# Patient Record
Sex: Female | Born: 1945 | Race: White | Hispanic: No | Marital: Married | Smoking: Former smoker
Health system: Southern US, Community
[De-identification: ages and names within clinical notes are randomized; demographics above are authoritative.]

## PROBLEM LIST (undated history)

## (undated) DIAGNOSIS — E785 Hyperlipidemia, unspecified: Secondary | ICD-10-CM

## (undated) DIAGNOSIS — K219 Gastro-esophageal reflux disease without esophagitis: Secondary | ICD-10-CM

## (undated) DIAGNOSIS — E039 Hypothyroidism, unspecified: Secondary | ICD-10-CM

## (undated) DIAGNOSIS — F32A Depression, unspecified: Secondary | ICD-10-CM

## (undated) DIAGNOSIS — D649 Anemia, unspecified: Secondary | ICD-10-CM

## (undated) DIAGNOSIS — M2550 Pain in unspecified joint: Secondary | ICD-10-CM

## (undated) DIAGNOSIS — E079 Disorder of thyroid, unspecified: Secondary | ICD-10-CM

## (undated) DIAGNOSIS — T7840XA Allergy, unspecified, initial encounter: Secondary | ICD-10-CM

## (undated) DIAGNOSIS — D0510 Intraductal carcinoma in situ of unspecified breast: Secondary | ICD-10-CM

## (undated) DIAGNOSIS — H269 Unspecified cataract: Secondary | ICD-10-CM

## (undated) DIAGNOSIS — J45909 Unspecified asthma, uncomplicated: Secondary | ICD-10-CM

## (undated) DIAGNOSIS — G4700 Insomnia, unspecified: Secondary | ICD-10-CM

## (undated) DIAGNOSIS — C50919 Malignant neoplasm of unspecified site of unspecified female breast: Secondary | ICD-10-CM

## (undated) DIAGNOSIS — F419 Anxiety disorder, unspecified: Secondary | ICD-10-CM

## (undated) DIAGNOSIS — I10 Essential (primary) hypertension: Secondary | ICD-10-CM

## (undated) HISTORY — DX: Pain in unspecified joint: M25.50

## (undated) HISTORY — PX: CATARACT EXTRACTION, BILATERAL: SHX1313

## (undated) HISTORY — DX: Intraductal carcinoma in situ of unspecified breast: D05.10

## (undated) HISTORY — DX: Hyperlipidemia, unspecified: E78.5

## (undated) HISTORY — DX: Malignant neoplasm of unspecified site of unspecified female breast: C50.919

## (undated) HISTORY — DX: Disorder of thyroid, unspecified: E07.9

## (undated) HISTORY — PX: POLYPECTOMY: SHX149

## (undated) HISTORY — DX: Insomnia, unspecified: G47.00

## (undated) HISTORY — DX: Essential (primary) hypertension: I10

## (undated) HISTORY — DX: Allergy, unspecified, initial encounter: T78.40XA

## (undated) HISTORY — DX: Unspecified cataract: H26.9

---

## 1973-09-04 HISTORY — PX: ABDOMINAL HYSTERECTOMY: SHX81

## 1998-07-01 ENCOUNTER — Ambulatory Visit (HOSPITAL_COMMUNITY): Admission: RE | Admit: 1998-07-01 | Discharge: 1998-07-01 | Payer: Self-pay | Admitting: Obstetrics and Gynecology

## 1998-11-26 ENCOUNTER — Encounter: Admission: RE | Admit: 1998-11-26 | Discharge: 1998-11-26 | Payer: Self-pay | Admitting: Sports Medicine

## 1999-01-24 ENCOUNTER — Encounter: Admission: RE | Admit: 1999-01-24 | Discharge: 1999-01-24 | Payer: Self-pay | Admitting: Family Medicine

## 1999-02-07 ENCOUNTER — Other Ambulatory Visit: Admission: RE | Admit: 1999-02-07 | Discharge: 1999-02-07 | Payer: Self-pay | Admitting: Family Medicine

## 1999-05-13 ENCOUNTER — Encounter: Admission: RE | Admit: 1999-05-13 | Discharge: 1999-05-13 | Payer: Self-pay | Admitting: Family Medicine

## 1999-06-24 ENCOUNTER — Encounter: Admission: RE | Admit: 1999-06-24 | Discharge: 1999-06-24 | Payer: Self-pay | Admitting: Sports Medicine

## 1999-07-05 ENCOUNTER — Ambulatory Visit (HOSPITAL_COMMUNITY): Admission: RE | Admit: 1999-07-05 | Discharge: 1999-07-05 | Payer: Self-pay | Admitting: Obstetrics and Gynecology

## 1999-07-05 ENCOUNTER — Encounter: Payer: Self-pay | Admitting: Obstetrics and Gynecology

## 1999-08-02 ENCOUNTER — Other Ambulatory Visit: Admission: RE | Admit: 1999-08-02 | Discharge: 1999-08-02 | Payer: Self-pay | Admitting: Obstetrics and Gynecology

## 1999-09-26 ENCOUNTER — Encounter: Admission: RE | Admit: 1999-09-26 | Discharge: 1999-09-26 | Payer: Self-pay | Admitting: Family Medicine

## 2000-01-23 ENCOUNTER — Encounter: Admission: RE | Admit: 2000-01-23 | Discharge: 2000-01-23 | Payer: Self-pay | Admitting: Sports Medicine

## 2000-02-03 ENCOUNTER — Other Ambulatory Visit: Admission: RE | Admit: 2000-02-03 | Discharge: 2000-02-03 | Payer: Self-pay | Admitting: Family Medicine

## 2000-04-16 ENCOUNTER — Encounter: Admission: RE | Admit: 2000-04-16 | Discharge: 2000-04-16 | Payer: Self-pay | Admitting: Sports Medicine

## 2000-05-25 ENCOUNTER — Encounter: Payer: Self-pay | Admitting: Family Medicine

## 2000-05-25 ENCOUNTER — Encounter: Admission: RE | Admit: 2000-05-25 | Discharge: 2000-05-25 | Payer: Self-pay | Admitting: Family Medicine

## 2000-05-28 ENCOUNTER — Encounter: Admission: RE | Admit: 2000-05-28 | Discharge: 2000-05-28 | Payer: Self-pay | Admitting: Sports Medicine

## 2000-07-05 ENCOUNTER — Encounter: Payer: Self-pay | Admitting: Obstetrics and Gynecology

## 2000-07-05 ENCOUNTER — Ambulatory Visit (HOSPITAL_COMMUNITY): Admission: RE | Admit: 2000-07-05 | Discharge: 2000-07-05 | Payer: Self-pay | Admitting: Obstetrics and Gynecology

## 2000-08-06 ENCOUNTER — Other Ambulatory Visit: Admission: RE | Admit: 2000-08-06 | Discharge: 2000-08-06 | Payer: Self-pay | Admitting: Obstetrics and Gynecology

## 2000-08-09 ENCOUNTER — Encounter: Payer: Self-pay | Admitting: Obstetrics and Gynecology

## 2000-08-09 ENCOUNTER — Encounter: Admission: RE | Admit: 2000-08-09 | Discharge: 2000-08-09 | Payer: Self-pay | Admitting: Obstetrics and Gynecology

## 2000-10-09 ENCOUNTER — Encounter: Payer: Self-pay | Admitting: Obstetrics and Gynecology

## 2000-10-09 ENCOUNTER — Ambulatory Visit (HOSPITAL_COMMUNITY): Admission: RE | Admit: 2000-10-09 | Discharge: 2000-10-09 | Payer: Self-pay | Admitting: Obstetrics and Gynecology

## 2001-02-21 ENCOUNTER — Other Ambulatory Visit: Admission: RE | Admit: 2001-02-21 | Discharge: 2001-02-21 | Payer: Self-pay | Admitting: Family Medicine

## 2001-07-08 ENCOUNTER — Encounter: Payer: Self-pay | Admitting: Obstetrics and Gynecology

## 2001-07-08 ENCOUNTER — Ambulatory Visit (HOSPITAL_COMMUNITY): Admission: RE | Admit: 2001-07-08 | Discharge: 2001-07-08 | Payer: Self-pay | Admitting: Obstetrics and Gynecology

## 2001-09-09 ENCOUNTER — Encounter: Payer: Self-pay | Admitting: Obstetrics and Gynecology

## 2001-09-09 ENCOUNTER — Encounter: Admission: RE | Admit: 2001-09-09 | Discharge: 2001-09-09 | Payer: Self-pay | Admitting: Obstetrics and Gynecology

## 2001-10-02 ENCOUNTER — Other Ambulatory Visit: Admission: RE | Admit: 2001-10-02 | Discharge: 2001-10-02 | Payer: Self-pay | Admitting: Obstetrics and Gynecology

## 2002-02-24 ENCOUNTER — Other Ambulatory Visit: Admission: RE | Admit: 2002-02-24 | Discharge: 2002-02-24 | Payer: Self-pay | Admitting: Family Medicine

## 2002-07-10 ENCOUNTER — Encounter: Payer: Self-pay | Admitting: Obstetrics and Gynecology

## 2002-07-10 ENCOUNTER — Ambulatory Visit (HOSPITAL_COMMUNITY): Admission: RE | Admit: 2002-07-10 | Discharge: 2002-07-10 | Payer: Self-pay | Admitting: Obstetrics and Gynecology

## 2002-09-19 ENCOUNTER — Encounter: Admission: RE | Admit: 2002-09-19 | Discharge: 2002-09-19 | Payer: Self-pay | Admitting: Family Medicine

## 2002-09-19 ENCOUNTER — Encounter: Payer: Self-pay | Admitting: Family Medicine

## 2002-10-06 ENCOUNTER — Other Ambulatory Visit: Admission: RE | Admit: 2002-10-06 | Discharge: 2002-10-06 | Payer: Self-pay | Admitting: Obstetrics and Gynecology

## 2003-05-21 ENCOUNTER — Encounter: Admission: RE | Admit: 2003-05-21 | Discharge: 2003-05-21 | Payer: Self-pay | Admitting: Family Medicine

## 2003-05-21 ENCOUNTER — Encounter: Payer: Self-pay | Admitting: Family Medicine

## 2003-07-17 ENCOUNTER — Ambulatory Visit (HOSPITAL_COMMUNITY): Admission: RE | Admit: 2003-07-17 | Discharge: 2003-07-17 | Payer: Self-pay | Admitting: Obstetrics and Gynecology

## 2003-11-13 ENCOUNTER — Other Ambulatory Visit: Admission: RE | Admit: 2003-11-13 | Discharge: 2003-11-13 | Payer: Self-pay | Admitting: Obstetrics and Gynecology

## 2004-03-02 ENCOUNTER — Other Ambulatory Visit: Admission: RE | Admit: 2004-03-02 | Discharge: 2004-03-02 | Payer: Self-pay | Admitting: Family Medicine

## 2004-07-27 ENCOUNTER — Ambulatory Visit (HOSPITAL_COMMUNITY): Admission: RE | Admit: 2004-07-27 | Discharge: 2004-07-27 | Payer: Self-pay | Admitting: Obstetrics and Gynecology

## 2004-11-16 ENCOUNTER — Other Ambulatory Visit: Admission: RE | Admit: 2004-11-16 | Discharge: 2004-11-16 | Payer: Self-pay | Admitting: Obstetrics and Gynecology

## 2005-08-07 ENCOUNTER — Ambulatory Visit (HOSPITAL_COMMUNITY): Admission: RE | Admit: 2005-08-07 | Discharge: 2005-08-07 | Payer: Self-pay | Admitting: Obstetrics and Gynecology

## 2005-11-30 ENCOUNTER — Other Ambulatory Visit: Admission: RE | Admit: 2005-11-30 | Discharge: 2005-11-30 | Payer: Self-pay | Admitting: Obstetrics and Gynecology

## 2006-02-12 ENCOUNTER — Encounter: Admission: RE | Admit: 2006-02-12 | Discharge: 2006-02-12 | Payer: Self-pay | Admitting: Family Medicine

## 2006-08-20 ENCOUNTER — Ambulatory Visit (HOSPITAL_COMMUNITY): Admission: RE | Admit: 2006-08-20 | Discharge: 2006-08-20 | Payer: Self-pay | Admitting: Obstetrics and Gynecology

## 2006-12-21 ENCOUNTER — Other Ambulatory Visit: Admission: RE | Admit: 2006-12-21 | Discharge: 2006-12-21 | Payer: Self-pay | Admitting: Obstetrics and Gynecology

## 2006-12-24 ENCOUNTER — Ambulatory Visit (HOSPITAL_COMMUNITY): Admission: RE | Admit: 2006-12-24 | Discharge: 2006-12-24 | Payer: Self-pay | Admitting: Obstetrics and Gynecology

## 2007-03-13 ENCOUNTER — Encounter: Admission: RE | Admit: 2007-03-13 | Discharge: 2007-03-13 | Payer: Self-pay | Admitting: Obstetrics and Gynecology

## 2007-08-30 ENCOUNTER — Encounter: Admission: RE | Admit: 2007-08-30 | Discharge: 2007-08-30 | Payer: Self-pay | Admitting: Obstetrics and Gynecology

## 2007-12-24 ENCOUNTER — Other Ambulatory Visit: Admission: RE | Admit: 2007-12-24 | Discharge: 2007-12-24 | Payer: Self-pay | Admitting: Obstetrics and Gynecology

## 2008-08-31 ENCOUNTER — Ambulatory Visit (HOSPITAL_COMMUNITY): Admission: RE | Admit: 2008-08-31 | Discharge: 2008-08-31 | Payer: Self-pay | Admitting: Obstetrics and Gynecology

## 2008-09-10 ENCOUNTER — Other Ambulatory Visit: Admission: RE | Admit: 2008-09-10 | Discharge: 2008-09-10 | Payer: Self-pay | Admitting: Family Medicine

## 2008-12-24 ENCOUNTER — Other Ambulatory Visit: Admission: RE | Admit: 2008-12-24 | Discharge: 2008-12-24 | Payer: Self-pay | Admitting: Obstetrics and Gynecology

## 2009-09-01 ENCOUNTER — Encounter: Admission: RE | Admit: 2009-09-01 | Discharge: 2009-09-01 | Payer: Self-pay | Admitting: Obstetrics and Gynecology

## 2009-09-04 DIAGNOSIS — D0510 Intraductal carcinoma in situ of unspecified breast: Secondary | ICD-10-CM

## 2009-09-04 DIAGNOSIS — C50919 Malignant neoplasm of unspecified site of unspecified female breast: Secondary | ICD-10-CM

## 2009-09-04 HISTORY — DX: Intraductal carcinoma in situ of unspecified breast: D05.10

## 2009-09-04 HISTORY — DX: Malignant neoplasm of unspecified site of unspecified female breast: C50.919

## 2009-09-04 HISTORY — PX: BREAST LUMPECTOMY: SHX2

## 2009-09-09 ENCOUNTER — Encounter: Admission: RE | Admit: 2009-09-09 | Discharge: 2009-09-09 | Payer: Self-pay | Admitting: Obstetrics and Gynecology

## 2009-09-13 ENCOUNTER — Encounter: Admission: RE | Admit: 2009-09-13 | Discharge: 2009-09-13 | Payer: Self-pay | Admitting: Obstetrics and Gynecology

## 2009-09-16 ENCOUNTER — Encounter: Admission: RE | Admit: 2009-09-16 | Discharge: 2009-09-16 | Payer: Self-pay | Admitting: Obstetrics and Gynecology

## 2009-10-06 ENCOUNTER — Encounter: Admission: RE | Admit: 2009-10-06 | Discharge: 2009-10-06 | Payer: Self-pay | Admitting: Surgery

## 2009-10-11 ENCOUNTER — Ambulatory Visit (HOSPITAL_BASED_OUTPATIENT_CLINIC_OR_DEPARTMENT_OTHER): Admission: RE | Admit: 2009-10-11 | Discharge: 2009-10-11 | Payer: Self-pay | Admitting: Surgery

## 2009-10-11 ENCOUNTER — Encounter: Admission: RE | Admit: 2009-10-11 | Discharge: 2009-10-11 | Payer: Self-pay | Admitting: Surgery

## 2009-10-13 ENCOUNTER — Ambulatory Visit: Payer: Self-pay | Admitting: Oncology

## 2009-10-20 ENCOUNTER — Ambulatory Visit: Admission: RE | Admit: 2009-10-20 | Discharge: 2009-12-17 | Payer: Self-pay | Admitting: Radiation Oncology

## 2009-10-25 LAB — COMPREHENSIVE METABOLIC PANEL
ALT: 20 U/L (ref 0–35)
AST: 18 U/L (ref 0–37)
Albumin: 4.3 g/dL (ref 3.5–5.2)
Alkaline Phosphatase: 109 U/L (ref 39–117)
BUN: 28 mg/dL — ABNORMAL HIGH (ref 6–23)
CO2: 25 mEq/L (ref 19–32)
Calcium: 8.6 mg/dL (ref 8.4–10.5)
Chloride: 108 mEq/L (ref 96–112)
Creatinine, Ser: 0.79 mg/dL (ref 0.40–1.20)
Glucose, Bld: 95 mg/dL (ref 70–99)
Potassium: 4 mEq/L (ref 3.5–5.3)
Sodium: 142 mEq/L (ref 135–145)
Total Bilirubin: 0.6 mg/dL (ref 0.3–1.2)
Total Protein: 6.8 g/dL (ref 6.0–8.3)

## 2009-10-25 LAB — CBC WITH DIFFERENTIAL/PLATELET
BASO%: 0.5 % (ref 0.0–2.0)
Basophils Absolute: 0 10*3/uL (ref 0.0–0.1)
EOS%: 8.2 % — ABNORMAL HIGH (ref 0.0–7.0)
Eosinophils Absolute: 0.4 10*3/uL (ref 0.0–0.5)
HCT: 41 % (ref 34.8–46.6)
HGB: 14.2 g/dL (ref 11.6–15.9)
LYMPH%: 34.5 % (ref 14.0–49.7)
MCH: 32.4 pg (ref 25.1–34.0)
MCHC: 34.7 g/dL (ref 31.5–36.0)
MCV: 93.4 fL (ref 79.5–101.0)
MONO#: 0.4 10*3/uL (ref 0.1–0.9)
MONO%: 8.5 % (ref 0.0–14.0)
NEUT#: 2.6 10*3/uL (ref 1.5–6.5)
NEUT%: 48.3 % (ref 38.4–76.8)
Platelets: 200 10*3/uL (ref 145–400)
RBC: 4.39 10*6/uL (ref 3.70–5.45)
RDW: 12.2 % (ref 11.2–14.5)
WBC: 5.3 10*3/uL (ref 3.9–10.3)
lymph#: 1.8 10*3/uL (ref 0.9–3.3)

## 2009-11-01 ENCOUNTER — Encounter: Admission: RE | Admit: 2009-11-01 | Discharge: 2009-11-01 | Payer: Self-pay | Admitting: Radiation Oncology

## 2009-11-17 ENCOUNTER — Ambulatory Visit: Payer: Self-pay | Admitting: Oncology

## 2009-11-19 LAB — BASIC METABOLIC PANEL
BUN: 23 mg/dL (ref 6–23)
CO2: 28 mEq/L (ref 19–32)
Calcium: 8.6 mg/dL (ref 8.4–10.5)
Chloride: 106 mEq/L (ref 96–112)
Creatinine, Ser: 0.83 mg/dL (ref 0.40–1.20)
Glucose, Bld: 103 mg/dL — ABNORMAL HIGH (ref 70–99)
Potassium: 4.2 mEq/L (ref 3.5–5.3)
Sodium: 142 mEq/L (ref 135–145)

## 2009-11-19 LAB — CBC WITH DIFFERENTIAL/PLATELET
BASO%: 0.4 % (ref 0.0–2.0)
Basophils Absolute: 0 10*3/uL (ref 0.0–0.1)
EOS%: 2.8 % (ref 0.0–7.0)
Eosinophils Absolute: 0.1 10*3/uL (ref 0.0–0.5)
HCT: 42.7 % (ref 34.8–46.6)
HGB: 14.7 g/dL (ref 11.6–15.9)
LYMPH%: 30.4 % (ref 14.0–49.7)
MCH: 32.4 pg (ref 25.1–34.0)
MCHC: 34.4 g/dL (ref 31.5–36.0)
MCV: 94.1 fL (ref 79.5–101.0)
MONO#: 0.6 10*3/uL (ref 0.1–0.9)
MONO%: 12.2 % (ref 0.0–14.0)
NEUT#: 2.6 10*3/uL (ref 1.5–6.5)
NEUT%: 54.2 % (ref 38.4–76.8)
Platelets: 190 10*3/uL (ref 145–400)
RBC: 4.54 10*6/uL (ref 3.70–5.45)
RDW: 12.6 % (ref 11.2–14.5)
WBC: 4.7 10*3/uL (ref 3.9–10.3)
lymph#: 1.4 10*3/uL (ref 0.9–3.3)

## 2009-12-23 ENCOUNTER — Ambulatory Visit: Payer: Self-pay | Admitting: Oncology

## 2009-12-28 ENCOUNTER — Other Ambulatory Visit: Admission: RE | Admit: 2009-12-28 | Discharge: 2009-12-28 | Payer: Self-pay | Admitting: Obstetrics and Gynecology

## 2009-12-29 ENCOUNTER — Ambulatory Visit: Admission: RE | Admit: 2009-12-29 | Discharge: 2009-12-29 | Payer: Self-pay | Admitting: Radiation Oncology

## 2009-12-29 ENCOUNTER — Ambulatory Visit (HOSPITAL_COMMUNITY): Admission: RE | Admit: 2009-12-29 | Discharge: 2009-12-29 | Payer: Self-pay | Admitting: Radiation Oncology

## 2009-12-30 LAB — CMP (CANCER CENTER ONLY)
ALT(SGPT): 33 U/L (ref 10–47)
AST: 36 U/L (ref 11–38)
Albumin: 3.8 g/dL (ref 3.3–5.5)
Alkaline Phosphatase: 106 U/L — ABNORMAL HIGH (ref 26–84)
BUN, Bld: 19 mg/dL (ref 7–22)
CO2: 31 mEq/L (ref 18–33)
Calcium: 9.4 mg/dL (ref 8.0–10.3)
Chloride: 99 mEq/L (ref 98–108)
Creat: 0.9 mg/dl (ref 0.6–1.2)
Glucose, Bld: 93 mg/dL (ref 73–118)
Potassium: 4.8 mEq/L — ABNORMAL HIGH (ref 3.3–4.7)
Sodium: 142 mEq/L (ref 128–145)
Total Bilirubin: 1 mg/dl (ref 0.20–1.60)
Total Protein: 7.1 g/dL (ref 6.4–8.1)

## 2009-12-30 LAB — CBC WITH DIFFERENTIAL (CANCER CENTER ONLY)
BASO#: 0 10*3/uL (ref 0.0–0.2)
BASO%: 0.8 % (ref 0.0–2.0)
EOS%: 5.1 % (ref 0.0–7.0)
Eosinophils Absolute: 0.2 10*3/uL (ref 0.0–0.5)
HCT: 42.8 % (ref 34.8–46.6)
HGB: 14.7 g/dL (ref 11.6–15.9)
LYMPH#: 1.2 10*3/uL (ref 0.9–3.3)
LYMPH%: 34 % (ref 14.0–48.0)
MCH: 31.7 pg (ref 26.0–34.0)
MCHC: 34.3 g/dL (ref 32.0–36.0)
MCV: 92 fL (ref 81–101)
MONO#: 0.5 10*3/uL (ref 0.1–0.9)
MONO%: 12.5 % (ref 0.0–13.0)
NEUT#: 1.7 10*3/uL (ref 1.5–6.5)
NEUT%: 47.6 % (ref 39.6–80.0)
Platelets: 166 10*3/uL (ref 145–400)
RBC: 4.63 10*6/uL (ref 3.70–5.32)
RDW: 12.1 % (ref 10.5–14.6)
WBC: 3.6 10*3/uL — ABNORMAL LOW (ref 3.9–10.0)

## 2010-02-17 ENCOUNTER — Ambulatory Visit: Payer: Self-pay | Admitting: Oncology

## 2010-02-21 LAB — COMPREHENSIVE METABOLIC PANEL
ALT: 88 U/L — ABNORMAL HIGH (ref 0–35)
AST: 93 U/L — ABNORMAL HIGH (ref 0–37)
Albumin: 4 g/dL (ref 3.5–5.2)
Alkaline Phosphatase: 124 U/L — ABNORMAL HIGH (ref 39–117)
BUN: 26 mg/dL — ABNORMAL HIGH (ref 6–23)
CO2: 24 mEq/L (ref 19–32)
Calcium: 9.1 mg/dL (ref 8.4–10.5)
Chloride: 110 mEq/L (ref 96–112)
Creatinine, Ser: 0.79 mg/dL (ref 0.40–1.20)
Glucose, Bld: 106 mg/dL — ABNORMAL HIGH (ref 70–99)
Potassium: 4.2 mEq/L (ref 3.5–5.3)
Sodium: 144 mEq/L (ref 135–145)
Total Bilirubin: 0.4 mg/dL (ref 0.3–1.2)
Total Protein: 6.5 g/dL (ref 6.0–8.3)

## 2010-02-21 LAB — CBC WITH DIFFERENTIAL/PLATELET
BASO%: 0.4 % (ref 0.0–2.0)
Basophils Absolute: 0 10*3/uL (ref 0.0–0.1)
EOS%: 4.3 % (ref 0.0–7.0)
Eosinophils Absolute: 0.2 10*3/uL (ref 0.0–0.5)
HCT: 39.1 % (ref 34.8–46.6)
HGB: 13.4 g/dL (ref 11.6–15.9)
LYMPH%: 29 % (ref 14.0–49.7)
MCH: 31.9 pg (ref 25.1–34.0)
MCHC: 34.3 g/dL (ref 31.5–36.0)
MCV: 93 fL (ref 79.5–101.0)
MONO#: 0.4 10*3/uL (ref 0.1–0.9)
MONO%: 9.9 % (ref 0.0–14.0)
NEUT#: 2.5 10*3/uL (ref 1.5–6.5)
NEUT%: 56.4 % (ref 38.4–76.8)
Platelets: 180 10*3/uL (ref 145–400)
RBC: 4.21 10*6/uL (ref 3.70–5.45)
RDW: 12.7 % (ref 11.2–14.5)
WBC: 4.5 10*3/uL (ref 3.9–10.3)
lymph#: 1.3 10*3/uL (ref 0.9–3.3)

## 2010-02-23 ENCOUNTER — Ambulatory Visit (HOSPITAL_COMMUNITY): Admission: RE | Admit: 2010-02-23 | Discharge: 2010-02-23 | Payer: Self-pay | Admitting: Oncology

## 2010-03-14 LAB — COMPREHENSIVE METABOLIC PANEL
ALT: 21 U/L (ref 0–35)
AST: 26 U/L (ref 0–37)
Albumin: 3.9 g/dL (ref 3.5–5.2)
Alkaline Phosphatase: 102 U/L (ref 39–117)
BUN: 21 mg/dL (ref 6–23)
CO2: 26 mEq/L (ref 19–32)
Calcium: 9.1 mg/dL (ref 8.4–10.5)
Chloride: 107 mEq/L (ref 96–112)
Creatinine, Ser: 0.81 mg/dL (ref 0.40–1.20)
Glucose, Bld: 94 mg/dL (ref 70–99)
Potassium: 4.3 mEq/L (ref 3.5–5.3)
Sodium: 142 mEq/L (ref 135–145)
Total Bilirubin: 0.5 mg/dL (ref 0.3–1.2)
Total Protein: 6.3 g/dL (ref 6.0–8.3)

## 2010-03-14 LAB — GAMMA GT: GGT: 15 U/L (ref 7–51)

## 2010-04-28 ENCOUNTER — Ambulatory Visit: Payer: Self-pay | Admitting: Oncology

## 2010-05-03 LAB — CMP (CANCER CENTER ONLY)
ALT(SGPT): 37 U/L (ref 10–47)
AST: 36 U/L (ref 11–38)
Albumin: 4 g/dL (ref 3.3–5.5)
Alkaline Phosphatase: 132 U/L — ABNORMAL HIGH (ref 26–84)
BUN, Bld: 30 mg/dL — ABNORMAL HIGH (ref 7–22)
CO2: 29 mEq/L (ref 18–33)
Calcium: 9.2 mg/dL (ref 8.0–10.3)
Chloride: 99 mEq/L (ref 98–108)
Creat: 1 mg/dl (ref 0.6–1.2)
Glucose, Bld: 106 mg/dL (ref 73–118)
Potassium: 4.3 mEq/L (ref 3.3–4.7)
Sodium: 140 mEq/L (ref 128–145)
Total Bilirubin: 0.7 mg/dl (ref 0.20–1.60)
Total Protein: 7.1 g/dL (ref 6.4–8.1)

## 2010-05-27 LAB — CMP (CANCER CENTER ONLY)
ALT(SGPT): 24 U/L (ref 10–47)
AST: 31 U/L (ref 11–38)
Albumin: 3.7 g/dL (ref 3.3–5.5)
Alkaline Phosphatase: 125 U/L — ABNORMAL HIGH (ref 26–84)
BUN, Bld: 23 mg/dL — ABNORMAL HIGH (ref 7–22)
CO2: 29 mEq/L (ref 18–33)
Calcium: 9.5 mg/dL (ref 8.0–10.3)
Chloride: 101 mEq/L (ref 98–108)
Creat: 0.8 mg/dl (ref 0.6–1.2)
Glucose, Bld: 102 mg/dL (ref 73–118)
Potassium: 3.9 mEq/L (ref 3.3–4.7)
Sodium: 141 mEq/L (ref 128–145)
Total Bilirubin: 0.9 mg/dl (ref 0.20–1.60)
Total Protein: 7.1 g/dL (ref 6.4–8.1)

## 2010-05-27 LAB — CBC WITH DIFFERENTIAL (CANCER CENTER ONLY)
BASO#: 0 10*3/uL (ref 0.0–0.2)
BASO%: 0.8 % (ref 0.0–2.0)
EOS%: 3.8 % (ref 0.0–7.0)
Eosinophils Absolute: 0.1 10*3/uL (ref 0.0–0.5)
HCT: 43.2 % (ref 34.8–46.6)
HGB: 14.8 g/dL (ref 11.6–15.9)
LYMPH#: 1.3 10*3/uL (ref 0.9–3.3)
LYMPH%: 34.2 % (ref 14.0–48.0)
MCH: 31.9 pg (ref 26.0–34.0)
MCHC: 34.2 g/dL (ref 32.0–36.0)
MCV: 93 fL (ref 81–101)
MONO#: 0.5 10*3/uL (ref 0.1–0.9)
MONO%: 12.4 % (ref 0.0–13.0)
NEUT#: 1.9 10*3/uL (ref 1.5–6.5)
NEUT%: 48.8 % (ref 39.6–80.0)
Platelets: 191 10*3/uL (ref 145–400)
RBC: 4.63 10*6/uL (ref 3.70–5.32)
RDW: 11.9 % (ref 10.5–14.6)
WBC: 3.6 10*3/uL — ABNORMAL LOW (ref 3.9–10.0)

## 2010-06-06 ENCOUNTER — Encounter: Admission: RE | Admit: 2010-06-06 | Discharge: 2010-06-06 | Payer: Self-pay

## 2010-09-02 ENCOUNTER — Encounter
Admission: RE | Admit: 2010-09-02 | Discharge: 2010-09-02 | Payer: Self-pay | Source: Home / Self Care | Attending: Oncology | Admitting: Oncology

## 2010-09-23 ENCOUNTER — Ambulatory Visit: Payer: Self-pay | Admitting: Oncology

## 2010-09-25 ENCOUNTER — Encounter: Payer: Self-pay | Admitting: Obstetrics and Gynecology

## 2010-09-28 LAB — CBC WITH DIFFERENTIAL/PLATELET
BASO%: 0.2 % (ref 0.0–2.0)
Basophils Absolute: 0 10*3/uL (ref 0.0–0.1)
EOS%: 1.8 % (ref 0.0–7.0)
Eosinophils Absolute: 0.1 10*3/uL (ref 0.0–0.5)
HCT: 40.1 % (ref 34.8–46.6)
HGB: 13.7 g/dL (ref 11.6–15.9)
LYMPH%: 34.8 % (ref 14.0–49.7)
MCH: 32.5 pg (ref 25.1–34.0)
MCHC: 34.2 g/dL (ref 31.5–36.0)
MCV: 94.8 fL (ref 79.5–101.0)
MONO#: 0.4 10*3/uL (ref 0.1–0.9)
MONO%: 9.5 % (ref 0.0–14.0)
NEUT#: 2.1 10*3/uL (ref 1.5–6.5)
NEUT%: 53.7 % (ref 38.4–76.8)
Platelets: 209 10*3/uL (ref 145–400)
RBC: 4.23 10*6/uL (ref 3.70–5.45)
RDW: 12.4 % (ref 11.2–14.5)
WBC: 3.9 10*3/uL (ref 3.9–10.3)
lymph#: 1.3 10*3/uL (ref 0.9–3.3)

## 2010-11-23 LAB — BASIC METABOLIC PANEL
BUN: 20 mg/dL (ref 6–23)
CO2: 27 mEq/L (ref 19–32)
Calcium: 9 mg/dL (ref 8.4–10.5)
Chloride: 105 mEq/L (ref 96–112)
Creatinine, Ser: 0.73 mg/dL (ref 0.4–1.2)
GFR calc non Af Amer: >60 mL/min (ref >60)
Glucose, Bld: 109 mg/dL — ABNORMAL HIGH (ref 70–99)
Potassium: 4.2 mEq/L (ref 3.5–5.1)
Sodium: 138 mEq/L (ref 135–145)

## 2010-11-23 LAB — POCT HEMOGLOBIN-HEMACUE: Hemoglobin: 15.1 g/dL — ABNORMAL HIGH (ref 12.0–15.0)

## 2011-02-07 ENCOUNTER — Encounter (INDEPENDENT_AMBULATORY_CARE_PROVIDER_SITE_OTHER): Payer: Self-pay | Admitting: Surgery

## 2011-03-23 ENCOUNTER — Encounter (HOSPITAL_BASED_OUTPATIENT_CLINIC_OR_DEPARTMENT_OTHER): Payer: Medicare Other | Admitting: Oncology

## 2011-03-23 ENCOUNTER — Other Ambulatory Visit: Payer: Self-pay | Admitting: Oncology

## 2011-03-23 DIAGNOSIS — R7989 Other specified abnormal findings of blood chemistry: Secondary | ICD-10-CM

## 2011-03-23 DIAGNOSIS — D0590 Unspecified type of carcinoma in situ of unspecified breast: Secondary | ICD-10-CM

## 2011-03-23 LAB — CBC WITH DIFFERENTIAL/PLATELET
BASO%: 0.4 % (ref 0.0–2.0)
Basophils Absolute: 0 10*3/uL (ref 0.0–0.1)
EOS%: 2.5 % (ref 0.0–7.0)
Eosinophils Absolute: 0.1 10*3/uL (ref 0.0–0.5)
HCT: 38 % (ref 34.8–46.6)
HGB: 13.2 g/dL (ref 11.6–15.9)
LYMPH%: 37.2 % (ref 14.0–49.7)
MCH: 32.8 pg (ref 25.1–34.0)
MCHC: 34.6 g/dL (ref 31.5–36.0)
MCV: 94.8 fL (ref 79.5–101.0)
MONO#: 0.6 10*3/uL (ref 0.1–0.9)
MONO%: 15.5 % — ABNORMAL HIGH (ref 0.0–14.0)
NEUT#: 1.6 10*3/uL (ref 1.5–6.5)
NEUT%: 44.4 % (ref 38.4–76.8)
Platelets: 191 10*3/uL (ref 145–400)
RBC: 4.01 10*6/uL (ref 3.70–5.45)
RDW: 12.4 % (ref 11.2–14.5)
WBC: 3.6 10*3/uL — ABNORMAL LOW (ref 3.9–10.3)
lymph#: 1.3 10*3/uL (ref 0.9–3.3)

## 2011-03-23 LAB — COMPREHENSIVE METABOLIC PANEL
ALT: 23 U/L (ref 0–35)
AST: 29 U/L (ref 0–37)
Albumin: 3.9 g/dL (ref 3.5–5.2)
Alkaline Phosphatase: 94 U/L (ref 39–117)
BUN: 22 mg/dL (ref 6–23)
CO2: 26 mEq/L (ref 19–32)
Calcium: 9.4 mg/dL (ref 8.4–10.5)
Chloride: 104 mEq/L (ref 96–112)
Creatinine, Ser: 0.78 mg/dL (ref 0.50–1.10)
Glucose, Bld: 95 mg/dL (ref 70–99)
Potassium: 4.3 mEq/L (ref 3.5–5.3)
Sodium: 140 mEq/L (ref 135–145)
Total Bilirubin: 0.7 mg/dL (ref 0.3–1.2)
Total Protein: 6.4 g/dL (ref 6.0–8.3)

## 2011-04-18 ENCOUNTER — Ambulatory Visit (INDEPENDENT_AMBULATORY_CARE_PROVIDER_SITE_OTHER): Payer: Self-pay | Admitting: Surgery

## 2011-07-10 ENCOUNTER — Telehealth: Payer: Self-pay | Admitting: Oncology

## 2011-07-10 NOTE — Telephone Encounter (Signed)
Pt called and scheduled her jan2013 appt

## 2011-09-04 ENCOUNTER — Other Ambulatory Visit: Payer: Self-pay | Admitting: Oncology

## 2011-09-04 DIAGNOSIS — Z853 Personal history of malignant neoplasm of breast: Secondary | ICD-10-CM

## 2011-09-07 ENCOUNTER — Ambulatory Visit
Admission: RE | Admit: 2011-09-07 | Discharge: 2011-09-07 | Disposition: A | Discharge status: Home / Self Care | Payer: Medicare Other | Source: Ambulatory Visit | Attending: Oncology | Admitting: Oncology

## 2011-09-07 ENCOUNTER — Other Ambulatory Visit: Payer: Self-pay | Admitting: Oncology

## 2011-09-07 DIAGNOSIS — Z853 Personal history of malignant neoplasm of breast: Secondary | ICD-10-CM

## 2011-09-07 HISTORY — PX: BREAST BIOPSY: SHX20

## 2011-09-08 ENCOUNTER — Telehealth: Payer: Self-pay | Admitting: Oncology

## 2011-09-08 ENCOUNTER — Other Ambulatory Visit: Payer: Self-pay | Admitting: Oncology

## 2011-09-08 NOTE — Telephone Encounter (Signed)
called pt and informed her of appt on 09/11/2011

## 2011-09-11 ENCOUNTER — Telehealth: Payer: Self-pay | Admitting: Oncology

## 2011-09-11 ENCOUNTER — Encounter: Payer: Self-pay | Admitting: Oncology

## 2011-09-11 ENCOUNTER — Ambulatory Visit (HOSPITAL_BASED_OUTPATIENT_CLINIC_OR_DEPARTMENT_OTHER): Payer: Medicare Other | Admitting: Oncology

## 2011-09-11 DIAGNOSIS — D0590 Unspecified type of carcinoma in situ of unspecified breast: Secondary | ICD-10-CM

## 2011-09-11 DIAGNOSIS — D0591 Unspecified type of carcinoma in situ of right breast: Secondary | ICD-10-CM | POA: Insufficient documentation

## 2011-09-11 DIAGNOSIS — M2550 Pain in unspecified joint: Secondary | ICD-10-CM | POA: Insufficient documentation

## 2011-09-11 DIAGNOSIS — D0510 Intraductal carcinoma in situ of unspecified breast: Secondary | ICD-10-CM

## 2011-09-11 DIAGNOSIS — G4700 Insomnia, unspecified: Secondary | ICD-10-CM

## 2011-09-11 HISTORY — DX: Intraductal carcinoma in situ of unspecified breast: D05.10

## 2011-09-11 HISTORY — DX: Insomnia, unspecified: G47.00

## 2011-09-11 HISTORY — DX: Pain in unspecified joint: M25.50

## 2011-09-11 NOTE — Progress Notes (Signed)
OFFICE PROGRESS NOTE  CC Dr. Cyndia Bent Dr. 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  #2 patient was then begun on Aromasin 25 mg daily she also takes Effexor 75 mg daily for hot flashes.  CURRENT THERAPY:Aromasin 25 mg daily since July 2011.  INTERVAL HISTORY: Lindsey Price 66 y.o. female returns forfollowup visit today. She had recently had a mammogram performed and she was found to have abnormalities in the left breast. She has undergone a undergone a biopsy of the left breast. This only showed cystic changes and no evidence of malignancy. Patient is quite relieved. Clinically she seems to be doing well she is without any problems. She does complain of 2 things. One she does have aches and pains due to the Aromasin but it is tolerable. She also complains of having insomnia. She could get to sleep easily but that she continues to wake up through the night. She has not been taking any sleeping aids. Otherwise she denies any fevers chills night sweats shortness of breath chest pains palpitations no hematuria hematochezia melena hemoptysis or hematemesis and remainder of the 10 point review of systems is negative.  MEDICAL HISTORY: Past Medical History  Diagnosis Date  . Hypertension   . Hyperlipidemia   . Thyroid disease   . 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 Insomnia 09/11/2011  . Arthralgia 09/11/2011    ALLERGIES:  is allergic to neosporin.  MEDICATIONS:  Current Outpatient Prescriptions  Medication Sig Dispense Refill  . exemestane (AROMASIN) 25 MG tablet Take 25 mg by mouth daily after breakfast.          SURGICAL HISTORY:  Past Surgical History  Procedure Date  . Abdominal hysterectomy 1975  . Breast lumpectomy 2011    REVIEW OF  SYSTEMS:  Pertinent items are noted in HPI.   PHYSICAL EXAMINATION: General appearance: alert, cooperative and appears stated age Head: Normocephalic, without obvious abnormality, atraumatic Neck: no adenopathy, no carotid bruit, no JVD, supple, symmetrical, trachea midline and thyroid not enlarged, symmetric, no tenderness/mass/nodules Lymph nodes: Cervical, supraclavicular, and axillary nodes normal. Resp: clear to auscultation bilaterally and normal percussion bilaterally Back: symmetric, no curvature. ROM normal. No CVA tenderness. Cardio: regular rate and rhythm, S1, S2 normal, no murmur, click, rub or gallop and normal apical impulse GI: soft, non-tender; bowel sounds normal; no masses,  no organomegaly Extremities: extremities normal, atraumatic, no cyanosis or edema Neurologic: Alert and oriented X 3, normal strength and tone. Normal symmetric reflexes. Normal coordination and gait Bilateral breasts are examined right breast reveals well-healed lumpectomy scar no masses no nipple discharge retraction or inversion. Left breast reveals the recently biopsied area with area of ecchymosis otherwise no masses nipple discharge. ECOG PERFORMANCE STATUS: 0 - Asymptomatic  Blood pressure 124/74, pulse 80, temperature 97.9 F (36.6 C), height 5\' 7"  (1.702 m), weight 165 lb 6.4 oz (75.025 kg).  LABORATORY DATA: Lab Results  Component Value Date   WBC 3.6* 03/23/2011   HGB 13.2 03/23/2011   HCT 38.0 03/23/2011   MCV 94.8 03/23/2011   PLT 191 03/23/2011      Chemistry      Component Value Date/Time   NA 140 03/23/2011 0859   NA 141 05/27/2010 1030   K 4.3 03/23/2011 0859   K 3.9 05/27/2010 1030  CL 104 03/23/2011 0859   CL 101 05/27/2010 1030   CO2 26 03/23/2011 0859   CO2 29 05/27/2010 1030   BUN 22 03/23/2011 0859   BUN 23* 05/27/2010 1030   CREATININE 0.78 03/23/2011 0859   CREATININE 0.8 05/27/2010 1030      Component Value Date/Time   CALCIUM 9.4 03/23/2011 0859   CALCIUM 9.5 05/27/2010  1030   ALKPHOS 94 03/23/2011 0859   ALKPHOS 125* 05/27/2010 1030   AST 29 03/23/2011 0859   AST 31 05/27/2010 1030   ALT 23 03/23/2011 0859   BILITOT 0.7 03/23/2011 0859   BILITOT 0.90 05/27/2010 1030       RADIOGRAPHIC STUDIES:  US Breast Left  09/07/2011  *RADIOLOGY REPORT*  Clinical Data:  History of right breast cancer status post lumpectomy 2011  DIGITAL DIAGNOSTIC BILATERAL MAMMOGRAM WITH CAD AND LEFT BREAST ULTRASOUND:  Comparison:  With priors  Findings:  There are scattered fibroglandular densities.  Stable lumpectomy changes are seen in the right breast.  There is a developing 7 mm nodule the lateral aspect of the left breast.  The right breast is negative.  There is no suspicious calcifications in either breast. Mammographic images were processed with CAD.  On physical exam, I do not palpate a mass in the left breast.  Ultrasound is performed, showing there is a hypoechoic lesion in the left breast at 3 o'clock 7 cm from the nipple measuring 7 x 5 x 5 mm.  Sonographic evaluation of the left axilla fails to reveal any enlarged adenopathy.  IMPRESSION: Suspicious left breast mass.  Tissue sampling is recommended.  An ultrasound-guided core biopsy will be performed and dictated separately.  BI-RADS CATEGORY 4:  Suspicious abnormality - biopsy should be considered.  Original Report Authenticated By: Littie Deeds. Judyann Munson, M.D.   [place] Core Biopsy  09/07/2011  *RADIOLOGY REPORT*  Clinical Data:  Suspicious left breast mass  ULTRASOUND GUIDED VACUUM ASSISTED CORE BIOPSY OF THE LEFT BREAST  Using sterile technique, 2% lidocaine, ultrasound guidance, and a 12 gauge vacuum assisted needle, biopsy was performed of a mass in the 3 o'clock region of the left breast.  At the conclusion of the procedure, a tissue marker clip was deployed into the biopsy cavity.  Follow-up 2-view mammogram was performed and dictated separately.  IMPRESSION: Ultrasound-guided biopsy of the left breast.  No apparent complications.  Original  Report Authenticated By: Littie Deeds. Judyann Munson, M.D.   Mm Digital Diagnostic Bilat  09/07/2011  *RADIOLOGY REPORT*  Clinical Data:  History of right breast cancer status post lumpectomy 2011  DIGITAL DIAGNOSTIC BILATERAL MAMMOGRAM WITH CAD AND LEFT BREAST ULTRASOUND:  Comparison:  With priors  Findings:  There are scattered fibroglandular densities.  Stable lumpectomy changes are seen in the right breast.  There is a developing 7 mm nodule the lateral aspect of the left breast.  The right breast is negative.  There is no suspicious calcifications in either breast. Mammographic images were processed with CAD.  On physical exam, I do not palpate a mass in the left breast.  Ultrasound is performed, showing there is a hypoechoic lesion in the left breast at 3 o'clock 7 cm from the nipple measuring 7 x 5 x 5 mm.  Sonographic evaluation of the left axilla fails to reveal any enlarged adenopathy.  IMPRESSION: Suspicious left breast mass.  Tissue sampling is recommended.  An ultrasound-guided core biopsy will be performed and dictated separately.  BI-RADS CATEGORY 4:  Suspicious abnormality - biopsy should be considered.  Original Report Authenticated By: Littie Deeds. Judyann Munson, M.D.   Mm Digital Diagnostic Unilat L  09/07/2011  *RADIOLOGY REPORT*  Clinical Data:  Status post ultrasound-guided core biopsy of a left breast mass  DIGITAL DIAGNOSTIC LEFT MAMMOGRAM  Comparison:  With priors  Findings:  Films are performed following ultrasound guided biopsy of a left breast mass.  Mammographic images demonstrate there is a coil shaped InRad clip in the lateral aspect of the left breast.  IMPRESSION: Status post ultrasound-guided core biopsy of the left breast with pathology pending.  Original Report Authenticated By: Littie Deeds. ARCEO, M.D.    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 The tumor was ER positive and because of this she was started on an  aromatase inhibitor consisting of Aromasin after she received radiation therapy. of note patient did try tamoxifen but could not tolerate it and this is the reason why she is on the Aromasin now. Clinically patient is without evidence of disease   PLAN: we will continue to follow the patient every 6 months. On her next visit she will be seen in the [facility]. For her insomnia I have recommended [place] patient trying Benadryl if this does not work then recommendation would be 6 a sleeping aid such as Ambien.   All questions were answered. The patient knows to call the clinic with any problems, questions or concerns. We can certainly see the patient much sooner if necessary.  I spent 25 minutes counseling the patient face to face. The total time spent in the appointment was 30 minutes.    Drue Second, MD Medical/Oncology [name] [phone] (beeper) [phone] (Office)  09/11/2011, 10:35 AM

## 2011-09-11 NOTE — Telephone Encounter (Signed)
Gv pt appt for july2013 °

## 2011-10-04 ENCOUNTER — Ambulatory Visit: Payer: Medicare Other | Admitting: Oncology

## 2011-10-04 ENCOUNTER — Other Ambulatory Visit: Payer: Medicare Other | Admitting: Lab

## 2011-10-05 ENCOUNTER — Encounter (INDEPENDENT_AMBULATORY_CARE_PROVIDER_SITE_OTHER): Payer: Self-pay | Admitting: General Surgery

## 2011-10-06 ENCOUNTER — Ambulatory Visit (INDEPENDENT_AMBULATORY_CARE_PROVIDER_SITE_OTHER): Payer: Medicare Other | Admitting: Surgery

## 2011-10-06 ENCOUNTER — Encounter (INDEPENDENT_AMBULATORY_CARE_PROVIDER_SITE_OTHER): Payer: Self-pay | Admitting: Surgery

## 2011-10-06 VITALS — BP 132/78 | HR 80 | Temp 97.2°F | Resp 18 | Ht 68.0 in | Wt 161.4 lb

## 2011-10-06 DIAGNOSIS — D0590 Unspecified type of carcinoma in situ of unspecified breast: Secondary | ICD-10-CM

## 2011-10-06 DIAGNOSIS — D0510 Intraductal carcinoma in situ of unspecified breast: Secondary | ICD-10-CM

## 2011-10-06 NOTE — Progress Notes (Signed)
NAME: [name]       DOB: Feb 05, 1946           DATE: 10/06/2011       MRN: [id number]   Lindsey Price is a 66 y.o.Marland Kitchenfemale who presents for routine followup of her Stage 0 right breast cancer diagnosed in 2011 and treated with lumpectomy and radiation. She has no problems or concerns on either side.Her recent mammogram showed an abnormality on the left, but was benign on biopsy  PFSH: She has had no significant changes since the last visit here.  ROS: There have been no significant changes since the last visit here  EXAM: General: The patient is alert, oriented, generally healty appearing, NAD. Mood and affect are normal.  Breasts:  Right breast tender especially medially around surgical site. The are is still a bit firm. Other than that, right breast and left breast are normal  Lymphatics: She has no axillary or supraclavicular adenopathy on either side.  Extremities: Full ROM of the surgical side with no lymphedema noted.  Data Reviewed: Recent mammogram and biopys report  Impression: Doing well, with no evidence of recurrent cancer or new cancer  Plan: Will continue to follow up on an annual basis here.

## 2011-12-12 ENCOUNTER — Telehealth: Payer: Self-pay | Admitting: Oncology

## 2011-12-12 NOTE — Telephone Encounter (Signed)
lmonvm adviisng the pt of her r/s appts from 03/14/2012 to 04/12/2012 due to the np is on vacation this day

## 2012-01-31 LAB — LIPID PANEL
Cholesterol: 156 (ref 0–200)
HDL: 58 (ref 35–70)
LDL Cholesterol: 85
Triglycerides: 65 (ref 40–160)

## 2012-01-31 LAB — HEPATIC FUNCTION PANEL
ALT: 41 — AB (ref 7–35)
AST: 49 — AB (ref 13–35)
Alkaline Phosphatase: 132 — AB (ref 25–125)
Bilirubin, Total: 0.5

## 2012-03-01 LAB — HEPATIC FUNCTION PANEL
ALT: 23 (ref 7–35)
AST: 27 (ref 13–35)
Alkaline Phosphatase: 127 — AB (ref 25–125)
Bilirubin, Total: 0.4

## 2012-03-06 ENCOUNTER — Other Ambulatory Visit (HOSPITAL_BASED_OUTPATIENT_CLINIC_OR_DEPARTMENT_OTHER): Payer: Medicare Other | Admitting: Lab

## 2012-03-06 DIAGNOSIS — D0510 Intraductal carcinoma in situ of unspecified breast: Secondary | ICD-10-CM

## 2012-03-06 DIAGNOSIS — D0590 Unspecified type of carcinoma in situ of unspecified breast: Secondary | ICD-10-CM

## 2012-03-08 ENCOUNTER — Encounter: Payer: Self-pay | Admitting: Oncology

## 2012-03-08 ENCOUNTER — Ambulatory Visit (HOSPITAL_BASED_OUTPATIENT_CLINIC_OR_DEPARTMENT_OTHER): Payer: Medicare Other | Admitting: Oncology

## 2012-03-08 ENCOUNTER — Telehealth: Payer: Self-pay | Admitting: *Deleted

## 2012-03-08 ENCOUNTER — Other Ambulatory Visit: Payer: Self-pay | Admitting: Medical Oncology

## 2012-03-08 VITALS — BP 142/78 | HR 88 | Temp 98.4°F | Ht 68.0 in | Wt 156.4 lb

## 2012-03-08 DIAGNOSIS — D0590 Unspecified type of carcinoma in situ of unspecified breast: Secondary | ICD-10-CM

## 2012-03-08 DIAGNOSIS — D0510 Intraductal carcinoma in situ of unspecified breast: Secondary | ICD-10-CM

## 2012-03-08 MED ORDER — EXEMESTANE 25 MG PO TABS: 25.0000 mg | ORAL_TABLET | Freq: Every day | ORAL | Status: DC

## 2012-03-08 MED ORDER — VENLAFAXINE HCL 75 MG PO TABS: 75.0000 mg | ORAL_TABLET | Freq: Every day | ORAL | Status: DC

## 2012-03-08 NOTE — Patient Instructions (Addendum)
1. Doing well.Continue aromasin and effexor  2. I will see you back in 6 months

## 2012-03-08 NOTE — Telephone Encounter (Signed)
Per patient request made patient appointment for lab two days before md appointment printed out calendar and gave to the patient

## 2012-03-08 NOTE — Progress Notes (Signed)
OFFICE PROGRESS NOTE  CC Dr. Cyndia Bent Dr. 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  #2 patient was then begun on Aromasin 25 mg daily she also takes Effexor 75 mg daily for hot flashes.  CURRENT THERAPY:Aromasin 25 mg daily since July 2011.  INTERVAL HISTORY: Lindsey Price 66 y.o. female returns forfollowup visit today. She had recently had a mammogram performed and she was found to have abnormalities in the left breast. She has undergone a undergone a biopsy of the left breast. This only showed cystic changes and no evidence of malignancy. Patient is quite relieved. Clinically she seems to be doing well she is without any problems. She does complain of 2 things. One she does have aches and pains due to the Aromasin but it is tolerable. She also complains of having insomnia. She could get to sleep easily but that she continues to wake up through the night. She has not been taking any sleeping aids. Otherwise she denies any fevers chills night sweats shortness of breath chest pains palpitations no hematuria hematochezia melena hemoptysis or hematemesis and remainder of the 10 point review of systems is negative.  MEDICAL HISTORY: Past Medical History  Diagnosis Date  . Hypertension   . Hyperlipidemia   . 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 Insomnia 09/11/2011  . Arthralgia 09/11/2011  . Thyroid disease     hypothyroidism    ALLERGIES:  is allergic to neosporin.  MEDICATIONS:  Current Outpatient Prescriptions  Medication Sig Dispense Refill  . exemestane (AROMASIN) 25 MG tablet Take 25 mg by mouth daily after breakfast.        . ezetimibe-simvastatin (VYTORIN) 10-10 MG per tablet Take 1 tablet by mouth at bedtime.      Marland Kitchen levothyroxine (SYNTHROID,  LEVOTHROID) 100 MCG tablet Take 100 mcg by mouth daily.      Marland Kitchen losartan (COZAAR) 50 MG tablet Take 50 mg by mouth daily.      . montelukast (SINGULAIR) 10 MG tablet Take 10 mg by mouth at bedtime.      Marland Kitchen venlafaxine (EFFEXOR) 75 MG tablet Take 75 mg by mouth daily.      Marland Kitchen ezetimibe (ZETIA) 10 MG tablet Take 10 mg by mouth daily.        SURGICAL HISTORY:  Past Surgical History  Procedure Date  . Abdominal hysterectomy 1975    partial  . Breast lumpectomy 2011    right breast    REVIEW OF SYSTEMS:  Pertinent items are noted in HPI.   PHYSICAL EXAMINATION: General appearance: alert, cooperative and appears stated age Head: Normocephalic, without obvious abnormality, atraumatic Neck: no adenopathy, no carotid bruit, no JVD, supple, symmetrical, trachea midline and thyroid not enlarged, symmetric, no tenderness/mass/nodules Lymph nodes: Cervical, supraclavicular, and axillary nodes normal. Resp: clear to auscultation bilaterally and normal percussion bilaterally Back: symmetric, no curvature. ROM normal. No CVA tenderness. Cardio: regular rate and rhythm, S1, S2 normal, no murmur, click, rub or gallop and normal apical impulse GI: soft, non-tender; bowel sounds normal; no masses,  no organomegaly Extremities: extremities normal, atraumatic, no cyanosis or edema Neurologic: Alert and oriented X 3, normal strength and tone. Normal symmetric reflexes. Normal coordination and gait Bilateral breasts are examined right breast reveals well-healed lumpectomy scar no masses no nipple discharge retraction or inversion.  Left breast reveals the recently biopsied area with area of ecchymosis otherwise no masses nipple discharge. ECOG PERFORMANCE STATUS: 0 - Asymptomatic  Blood pressure 142/78, pulse 88, temperature 98.4 F (36.9 C), temperature source Oral, height 5\' 8"  (1.727 m), weight 156 lb 6.4 oz (70.943 kg).  LABORATORY DATA: Lab Results  Component Value Date   WBC 3.6* 03/23/2011   HGB 13.2  03/23/2011   HCT 38.0 03/23/2011   MCV 94.8 03/23/2011   PLT 191 03/23/2011      Chemistry      Component Value Date/Time   NA 140 03/23/2011 0859   NA 141 05/27/2010 1030   K 4.3 03/23/2011 0859   K 3.9 05/27/2010 1030   CL 104 03/23/2011 0859   CL 101 05/27/2010 1030   CO2 26 03/23/2011 0859   CO2 29 05/27/2010 1030   BUN 22 03/23/2011 0859   BUN 23* 05/27/2010 1030   CREATININE 0.78 03/23/2011 0859   CREATININE 0.8 05/27/2010 1030      Component Value Date/Time   CALCIUM 9.4 03/23/2011 0859   CALCIUM 9.5 05/27/2010 1030   ALKPHOS 94 03/23/2011 0859   ALKPHOS 125* 05/27/2010 1030   AST 29 03/23/2011 0859   AST 31 05/27/2010 1030   ALT 23 03/23/2011 0859   BILITOT 0.7 03/23/2011 0859   BILITOT 0.90 05/27/2010 1030       RADIOGRAPHIC STUDIES:  US Breast Left  09/07/2011  *RADIOLOGY REPORT*  Clinical Data:  History of right breast cancer status post lumpectomy 2011  DIGITAL DIAGNOSTIC BILATERAL MAMMOGRAM WITH CAD AND LEFT BREAST ULTRASOUND:  Comparison:  With priors  Findings:  There are scattered fibroglandular densities.  Stable lumpectomy changes are seen in the right breast.  There is a developing 7 mm nodule the lateral aspect of the left breast.  The right breast is negative.  There is no suspicious calcifications in either breast. Mammographic images were processed with CAD.  On physical exam, I do not palpate a mass in the left breast.  Ultrasound is performed, showing there is a hypoechoic lesion in the left breast at 3 o'clock 7 cm from the nipple measuring 7 x 5 x 5 mm.  Sonographic evaluation of the left axilla fails to reveal any enlarged adenopathy.  IMPRESSION: Suspicious left breast mass.  Tissue sampling is recommended.  An ultrasound-guided core biopsy will be performed and dictated separately.  BI-RADS CATEGORY 4:  Suspicious abnormality - biopsy should be considered.  Original Report Authenticated By: Littie Deeds. Judyann Munson, M.D.   [place] Core Biopsy  09/07/2011  *RADIOLOGY REPORT*  Clinical  Data:  Suspicious left breast mass  ULTRASOUND GUIDED VACUUM ASSISTED CORE BIOPSY OF THE LEFT BREAST  Using sterile technique, 2% lidocaine, ultrasound guidance, and a 12 gauge vacuum assisted needle, biopsy was performed of a mass in the 3 o'clock region of the left breast.  At the conclusion of the procedure, a tissue marker clip was deployed into the biopsy cavity.  Follow-up 2-view mammogram was performed and dictated separately.  IMPRESSION: Ultrasound-guided biopsy of the left breast.  No apparent complications.  Original Report Authenticated By: Littie Deeds. Judyann Munson, M.D.   Mm Digital Diagnostic Bilat  09/07/2011  *RADIOLOGY REPORT*  Clinical Data:  History of right breast cancer status post lumpectomy 2011  DIGITAL DIAGNOSTIC BILATERAL MAMMOGRAM WITH CAD AND LEFT BREAST ULTRASOUND:  Comparison:  With priors  Findings:  There are scattered fibroglandular densities.  Stable lumpectomy changes are seen in the right breast.  There is a developing  7 mm nodule the lateral aspect of the left breast.  The right breast is negative.  There is no suspicious calcifications in either breast. Mammographic images were processed with CAD.  On physical exam, I do not palpate a mass in the left breast.  Ultrasound is performed, showing there is a hypoechoic lesion in the left breast at 3 o'clock 7 cm from the nipple measuring 7 x 5 x 5 mm.  Sonographic evaluation of the left axilla fails to reveal any enlarged adenopathy.  IMPRESSION: Suspicious left breast mass.  Tissue sampling is recommended.  An ultrasound-guided core biopsy will be performed and dictated separately.  BI-RADS CATEGORY 4:  Suspicious abnormality - biopsy should be considered.  Original Report Authenticated By: Littie Deeds. Judyann Munson, M.D.   Mm Digital Diagnostic Unilat L  09/07/2011  *RADIOLOGY REPORT*  Clinical Data:  Status post ultrasound-guided core biopsy of a left breast mass  DIGITAL DIAGNOSTIC LEFT MAMMOGRAM  Comparison:  With priors  Findings:  Films are  performed following ultrasound guided biopsy of a left breast mass.  Mammographic images demonstrate there is a coil shaped InRad clip in the lateral aspect of the left breast.  IMPRESSION: Status post ultrasound-guided core biopsy of the left breast with pathology pending.  Original Report Authenticated By: Littie Deeds. ARCEO, M.D.    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 The tumor was ER positive and because of this she was started on an aromatase inhibitor consisting of Aromasin after she received radiation therapy. of note patient did try tamoxifen but could not tolerate it and this is the reason why she is on the Aromasin now. Clinically patient is without evidence of disease   PLAN: we will continue to follow the patient every 6 months. Overall patient is doing well tolerating her adjuvant therapy which is Aromasin 25 mg daily. She does take Effexor 75 mg for hot flashes. This is helping her she is very active. She is up-to-date on her mammograms. I will see her back in 6 months time   All questions were answered. The patient knows to call the clinic with any problems, questions or concerns. We can certainly see the patient much sooner if necessary.  I spent 25 minutes counseling the patient face to face. The total time spent in the appointment was 30 minutes.    Drue Second, MD Medical/Oncology [facility] [phone] (beeper) [phone] (Office)  03/08/2012, 1:45 PM

## 2012-03-14 ENCOUNTER — Other Ambulatory Visit: Payer: Medicare Other | Admitting: Lab

## 2012-03-14 ENCOUNTER — Ambulatory Visit: Payer: Medicare Other | Admitting: Family

## 2012-03-24 ENCOUNTER — Other Ambulatory Visit: Payer: Self-pay | Admitting: Oncology

## 2012-03-25 ENCOUNTER — Other Ambulatory Visit: Payer: Self-pay | Admitting: Oncology

## 2012-04-12 ENCOUNTER — Ambulatory Visit: Payer: Medicare Other | Admitting: Family

## 2012-04-12 ENCOUNTER — Other Ambulatory Visit: Payer: Medicare Other | Admitting: Lab

## 2012-04-24 ENCOUNTER — Other Ambulatory Visit: Payer: Medicare Other | Admitting: Lab

## 2012-04-25 ENCOUNTER — Ambulatory Visit: Payer: Medicare Other | Admitting: Family

## 2012-05-22 LAB — HEPATIC FUNCTION PANEL
ALT: 24 (ref 7–35)
AST: 32 (ref 13–35)
Alkaline Phosphatase: 126 — AB (ref 25–125)
Bilirubin, Total: 0.5

## 2012-05-22 LAB — BASIC METABOLIC PANEL
BUN: 19 (ref 4–21)
Creatinine: 0.8 (ref 0.5–1.1)
Glucose: 95
Potassium: 4.4 (ref 3.4–5.3)
Sodium: 141 (ref 137–147)

## 2012-05-22 LAB — LIPID PANEL
Cholesterol: 134 (ref 0–200)
HDL: 45 (ref 35–70)
LDL Cholesterol: 78
Triglycerides: 55 (ref 40–160)

## 2012-07-29 ENCOUNTER — Other Ambulatory Visit: Payer: Self-pay | Admitting: Oncology

## 2012-07-29 DIAGNOSIS — N63 Unspecified lump in unspecified breast: Secondary | ICD-10-CM

## 2012-09-09 ENCOUNTER — Ambulatory Visit
Admission: RE | Admit: 2012-09-09 | Discharge: 2012-09-09 | Disposition: A | Discharge status: Home / Self Care | Payer: Medicare Other | Source: Ambulatory Visit | Attending: Oncology | Admitting: Oncology

## 2012-09-09 DIAGNOSIS — N63 Unspecified lump in unspecified breast: Secondary | ICD-10-CM

## 2012-09-17 ENCOUNTER — Telehealth: Payer: Self-pay | Admitting: *Deleted

## 2012-09-17 NOTE — Telephone Encounter (Signed)
patient called to reschedule to 11-04-2012

## 2012-09-28 ENCOUNTER — Other Ambulatory Visit: Payer: Self-pay | Admitting: Oncology

## 2012-10-07 ENCOUNTER — Other Ambulatory Visit: Payer: Medicare Other | Admitting: Lab

## 2012-10-09 ENCOUNTER — Ambulatory Visit: Payer: Medicare Other | Admitting: Oncology

## 2012-10-15 ENCOUNTER — Ambulatory Visit (INDEPENDENT_AMBULATORY_CARE_PROVIDER_SITE_OTHER): Payer: Medicare Other | Admitting: Surgery

## 2012-10-31 ENCOUNTER — Other Ambulatory Visit (HOSPITAL_BASED_OUTPATIENT_CLINIC_OR_DEPARTMENT_OTHER): Payer: Medicare Other

## 2012-10-31 DIAGNOSIS — D0510 Intraductal carcinoma in situ of unspecified breast: Secondary | ICD-10-CM

## 2012-10-31 DIAGNOSIS — R7989 Other specified abnormal findings of blood chemistry: Secondary | ICD-10-CM

## 2012-10-31 LAB — CBC WITH DIFFERENTIAL/PLATELET
BASO%: 0.4 % (ref 0.0–2.0)
Basophils Absolute: 0 10*3/uL (ref 0.0–0.1)
EOS%: 2.4 % (ref 0.0–7.0)
Eosinophils Absolute: 0.1 10*3/uL (ref 0.0–0.5)
HCT: 43.4 % (ref 34.8–46.6)
HGB: 14.9 g/dL (ref 11.6–15.9)
LYMPH%: 37 % (ref 14.0–49.7)
MCH: 32.1 pg (ref 25.1–34.0)
MCHC: 34.3 g/dL (ref 31.5–36.0)
MCV: 93.6 fL (ref 79.5–101.0)
MONO#: 0.5 10*3/uL (ref 0.1–0.9)
MONO%: 14.5 % — ABNORMAL HIGH (ref 0.0–14.0)
NEUT#: 1.6 10*3/uL (ref 1.5–6.5)
NEUT%: 45.7 % (ref 38.4–76.8)
Platelets: 212 10*3/uL (ref 145–400)
RBC: 4.64 10*6/uL (ref 3.70–5.45)
RDW: 12.5 % (ref 11.2–14.5)
WBC: 3.5 10*3/uL — ABNORMAL LOW (ref 3.9–10.3)
lymph#: 1.3 10*3/uL (ref 0.9–3.3)

## 2012-10-31 LAB — COMPREHENSIVE METABOLIC PANEL (CC13)
ALT: 22 U/L (ref 0–55)
AST: 24 U/L (ref 5–34)
Albumin: 3.7 g/dL (ref 3.5–5.0)
Alkaline Phosphatase: 131 U/L (ref 40–150)
BUN: 19 mg/dL (ref 7.0–26.0)
CO2: 27 mEq/L (ref 22–29)
Calcium: 9.5 mg/dL (ref 8.4–10.4)
Chloride: 106 mEq/L (ref 98–107)
Creatinine: 0.9 mg/dL (ref 0.6–1.1)
Glucose: 96 mg/dl (ref 70–99)
Potassium: 4.2 mEq/L (ref 3.5–5.1)
Sodium: 141 mEq/L (ref 136–145)
Total Bilirubin: 0.76 mg/dL (ref 0.20–1.20)
Total Protein: 6.8 g/dL (ref 6.4–8.3)

## 2012-10-31 LAB — VITAMIN D 25 HYDROXY (VIT D DEFICIENCY, FRACTURES): Vit D, 25-Hydroxy: 49 ng/mL (ref 30–89)

## 2012-11-01 ENCOUNTER — Other Ambulatory Visit: Payer: Medicare Other | Admitting: Lab

## 2012-11-04 ENCOUNTER — Encounter: Payer: Self-pay | Admitting: Oncology

## 2012-11-04 ENCOUNTER — Ambulatory Visit (HOSPITAL_BASED_OUTPATIENT_CLINIC_OR_DEPARTMENT_OTHER): Payer: Medicare Other | Admitting: Oncology

## 2012-11-04 ENCOUNTER — Telehealth: Payer: Self-pay | Admitting: Oncology

## 2012-11-04 VITALS — BP 137/79 | HR 89 | Temp 98.1°F | Resp 20 | Ht 68.0 in | Wt 157.9 lb

## 2012-11-04 DIAGNOSIS — D0511 Intraductal carcinoma in situ of right breast: Secondary | ICD-10-CM

## 2012-11-04 DIAGNOSIS — Z170 Estrogen receptor positive status [ER+]: Secondary | ICD-10-CM

## 2012-11-04 DIAGNOSIS — D0590 Unspecified type of carcinoma in situ of unspecified breast: Secondary | ICD-10-CM

## 2012-11-04 DIAGNOSIS — N959 Unspecified menopausal and perimenopausal disorder: Secondary | ICD-10-CM

## 2012-11-04 MED ORDER — VENLAFAXINE HCL ER 75 MG PO CP24: 75.0000 mg | ORAL_CAPSULE | Freq: Every day | ORAL | Status: DC

## 2012-11-04 MED ORDER — EXEMESTANE 25 MG PO TABS: 25.0000 mg | ORAL_TABLET | Freq: Every day | ORAL | Status: DC

## 2012-11-04 NOTE — Patient Instructions (Addendum)
Doing well  I will see you back in 6 months 

## 2012-11-04 NOTE — Telephone Encounter (Signed)
gv pt appt schedule for September.  °

## 2012-11-04 NOTE — Progress Notes (Signed)
OFFICE PROGRESS NOTE  CC Dr. Cyndia Bent Dr. 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  #2 patient was then begun on Aromasin 25 mg daily she also takes Effexor 75 mg daily for hot flashes.  CURRENT THERAPY:Aromasin 25 mg daily since July 2011.  INTERVAL HISTORY: Lindsey Price 67 y.o. female returns forfollowup visit today.  she does have aches and pains due to the Aromasin but it is tolerable. She also complains of having insomnia. She could get to sleep easily but that she continues to wake up through the night. She has not been taking any sleeping aids. Otherwise she denies any fevers chills night sweats shortness of breath chest pains palpitations no hematuria hematochezia melena hemoptysis or hematemesis and remainder of the 10 point review of systems is negative.  MEDICAL HISTORY: Past Medical History  Diagnosis Date  . Hypertension   . Hyperlipidemia   . 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 Insomnia 09/11/2011  . Arthralgia 09/11/2011  . Thyroid disease     hypothyroidism  . Breast cancer     ALLERGIES:  is allergic to neosporin.  MEDICATIONS:  Current Outpatient Prescriptions  Medication Sig Dispense Refill  . clobetasol (TEMOVATE) 0.05 % external solution       . exemestane (AROMASIN) 25 MG tablet TAKE ONE TABLET DAILY AFTER A MEAL GENERIC FOR AROMASIN 25MG  TAB  90 tablet  7  . ezetimibe-simvastatin (VYTORIN) 10-10 MG per tablet Take 1 tablet by mouth at bedtime.      Marland Kitchen levothyroxine (SYNTHROID, LEVOTHROID) 100 MCG tablet Take 100 mcg by mouth daily.      Marland Kitchen losartan (COZAAR) 50 MG tablet Take 50 mg by mouth daily.      . montelukast (SINGULAIR) 10 MG tablet Take 10 mg by mouth at bedtime.      Marland Kitchen venlafaxine (EFFEXOR) 75 MG tablet Take 1  tablet (75 mg total) by mouth daily.  90 tablet  12  . ezetimibe (ZETIA) 10 MG tablet Take 10 mg by mouth daily.      Marland Kitchen venlafaxine XR (EFFEXOR-XR) 75 MG 24 hr capsule TAKE 1 CAPSULE DAILY  90 capsule  3   No current facility-administered medications for this visit.    SURGICAL HISTORY:  Past Surgical History  Procedure Laterality Date  . Abdominal hysterectomy  1975    partial  . Breast lumpectomy  2011    right breast    REVIEW OF SYSTEMS:  Pertinent items are noted in HPI.   PHYSICAL EXAMINATION: General appearance: alert, cooperative and appears stated age Head: Normocephalic, without obvious abnormality, atraumatic Neck: no adenopathy, no carotid bruit, no JVD, supple, symmetrical, trachea midline and thyroid not enlarged, symmetric, no tenderness/mass/nodules Lymph nodes: Cervical, supraclavicular, and axillary nodes normal. Resp: clear to auscultation bilaterally and normal percussion bilaterally Back: symmetric, no curvature. ROM normal. No CVA tenderness. Cardio: regular rate and rhythm, S1, S2 normal, no murmur, click, rub or gallop and normal apical impulse GI: soft, non-tender; bowel sounds normal; no masses,  no organomegaly Extremities: extremities normal, atraumatic, no cyanosis or edema Neurologic: Alert and oriented X 3, normal strength and tone. Normal symmetric reflexes. Normal coordination and gait Bilateral breasts are examined right breast reveals well-healed lumpectomy scar no masses no nipple discharge retraction or inversion. Left breast reveals the recently  biopsied area with area of ecchymosis otherwise no masses nipple discharge. ECOG PERFORMANCE STATUS: 0 - Asymptomatic  Blood pressure 137/79, pulse 89, temperature 98.1 F (36.7 C), temperature source Oral, resp. rate 20, height 5\' 8"  (1.727 m), weight 157 lb 14.4 oz (71.623 kg).  LABORATORY DATA: Lab Results  Component Value Date   WBC 3.5* 10/31/2012   HGB 14.9 10/31/2012   HCT 43.4 10/31/2012   MCV  93.6 10/31/2012   PLT 212 10/31/2012      Chemistry      Component Value Date/Time   NA 141 10/31/2012 0901   NA 140 03/23/2011 0859   NA 141 05/27/2010 1030   K 4.2 10/31/2012 0901   K 4.3 03/23/2011 0859   K 3.9 05/27/2010 1030   CL 106 10/31/2012 0901   CL 104 03/23/2011 0859   CL 101 05/27/2010 1030   CO2 27 10/31/2012 0901   CO2 26 03/23/2011 0859   CO2 29 05/27/2010 1030   BUN 19.0 10/31/2012 0901   BUN 22 03/23/2011 0859   BUN 23* 05/27/2010 1030   CREATININE 0.9 10/31/2012 0901   CREATININE 0.78 03/23/2011 0859   CREATININE 0.8 05/27/2010 1030      Component Value Date/Time   CALCIUM 9.5 10/31/2012 0901   CALCIUM 9.4 03/23/2011 0859   CALCIUM 9.5 05/27/2010 1030   ALKPHOS 131 10/31/2012 0901   ALKPHOS 94 03/23/2011 0859   ALKPHOS 125* 05/27/2010 1030   AST 24 10/31/2012 0901   AST 29 03/23/2011 0859   AST 31 05/27/2010 1030   ALT 22 10/31/2012 0901   ALT 23 03/23/2011 0859   BILITOT 0.76 10/31/2012 0901   BILITOT 0.7 03/23/2011 0859   BILITOT 0.90 05/27/2010 1030       RADIOGRAPHIC STUDIES:    ASSESSMENT: 67 year old female with 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 The tumor was ER positive and because of this she was started on an aromatase inhibitor consisting of Aromasin after she received radiation therapy. of note patient did try tamoxifen but could not tolerate it and this is the reason why she is on the Aromasin now. Clinically patient is without evidence of disease   PLAN: we will continue to follow the patient every 6 months. Overall patient is doing well tolerating her adjuvant therapy which is Aromasin 25 mg daily. She does take Effexor 75 mg for hot flashes. This is helping her she is very active. She is up-to-date on her mammograms. I will see her back in 6 months time   All questions were answered. The patient knows to call the clinic with any problems, questions or concerns. We can certainly see the patient much  sooner if necessary.  I spent 25 minutes counseling the patient face to face. The total time spent in the appointment was 30 minutes.    Drue Second, MD Medical/Oncology [facility] [phone] (beeper) [phone] (Office)  11/04/2012, 9:48 AM

## 2012-11-07 ENCOUNTER — Encounter (INDEPENDENT_AMBULATORY_CARE_PROVIDER_SITE_OTHER): Payer: Self-pay | Admitting: Surgery

## 2012-11-07 ENCOUNTER — Ambulatory Visit (INDEPENDENT_AMBULATORY_CARE_PROVIDER_SITE_OTHER): Payer: Medicare Other | Admitting: Surgery

## 2012-11-07 VITALS — BP 122/74 | HR 60 | Temp 98.6°F | Resp 14 | Ht 68.0 in | Wt 157.0 lb

## 2012-11-07 DIAGNOSIS — D0511 Intraductal carcinoma in situ of right breast: Secondary | ICD-10-CM

## 2012-11-07 DIAGNOSIS — D0590 Unspecified type of carcinoma in situ of unspecified breast: Secondary | ICD-10-CM

## 2012-11-07 NOTE — Progress Notes (Signed)
NAME: HALLEIGH COMES Jenison       DOB: 1945/12/10           DATE: 11/07/2012       MRN: [id number]   KENNITA PAVLOVICH is a 67 y.o.Marland Kitchenfemale who presents for routine followup of her Stage 0 right breast cancer, receptor +, diagnosed in 2011 and treated with lumpectomy and radiation. She has no problems or concerns on either side. Her recent mammogram showed an abnormality on the left, but was benign on biopsy  PFSH: She has had no significant changes since the last visit here.  ROS: There have been no significant changes since the last visit here  EXAM: General: The patient is alert, oriented, generally healty appearing, NAD. Mood and affect are normal.  Breasts:  Right breast tender especially medially around surgical site. The are is still a bit firm. Other than that, right breast and left breast are normal  Lymphatics: She has no axillary or supraclavicular adenopathy on either side.  Extremities: Full ROM of the surgical side with no lymphedema noted.  Data Reviewed: Mammogram in January: IMPRESSION:  No findings worrisome for recurrent tumor or developing malignancy.  RECOMMENDATION:  Annual diagnostic mammography.  I have discussed the findings and recommendations with the patient.  Results were also provided in writing at the conclusion of the  visit.  BI-RADS CATEGORY 2: Benign finding(s).  Original Report Authenticated By: Rolla Plate, M.D.   Impression: Doing well, with no evidence of recurrent cancer or new cancer  Plan: Will continue to follow up on an annual basis here.

## 2012-11-07 NOTE — Patient Instructions (Signed)
Continue annual mammograms and we can see you again in a year

## 2012-11-21 ENCOUNTER — Other Ambulatory Visit: Payer: Self-pay | Admitting: Oncology

## 2012-11-26 LAB — HEPATIC FUNCTION PANEL
ALT: 24 (ref 7–35)
AST: 24 (ref 13–35)
Alkaline Phosphatase: 139 — AB (ref 25–125)
Bilirubin, Total: 0.9

## 2012-11-26 LAB — LIPID PANEL
Cholesterol: 180 (ref 0–200)
HDL: 62 (ref 35–70)
LDL Cholesterol: 103
Triglycerides: 77 (ref 40–160)

## 2012-11-26 LAB — BASIC METABOLIC PANEL
BUN: 21 (ref 4–21)
Creatinine: 0.8 (ref 0.5–1.1)
Glucose: 99
Potassium: 4.6 (ref 3.4–5.3)
Sodium: 140 (ref 137–147)

## 2012-11-26 LAB — TSH: TSH: 0.53 (ref 0.41–5.90)

## 2013-03-14 ENCOUNTER — Telehealth: Payer: Self-pay | Admitting: Oncology

## 2013-04-04 HISTORY — PX: ROTATOR CUFF REPAIR: SHX139

## 2013-04-09 ENCOUNTER — Other Ambulatory Visit: Payer: Self-pay

## 2013-04-15 ENCOUNTER — Other Ambulatory Visit: Payer: Self-pay | Admitting: *Deleted

## 2013-04-15 DIAGNOSIS — D0591 Unspecified type of carcinoma in situ of right breast: Secondary | ICD-10-CM

## 2013-04-15 MED ORDER — EXEMESTANE 25 MG PO TABS: 25.0000 mg | ORAL_TABLET | Freq: Every day | ORAL | Status: DC

## 2013-04-28 ENCOUNTER — Other Ambulatory Visit (HOSPITAL_BASED_OUTPATIENT_CLINIC_OR_DEPARTMENT_OTHER): Payer: Medicare Other | Admitting: Lab

## 2013-04-28 DIAGNOSIS — D0511 Intraductal carcinoma in situ of right breast: Secondary | ICD-10-CM

## 2013-04-28 DIAGNOSIS — D0590 Unspecified type of carcinoma in situ of unspecified breast: Secondary | ICD-10-CM

## 2013-04-28 LAB — CBC WITH DIFFERENTIAL/PLATELET
BASO%: 0.4 % (ref 0.0–2.0)
Basophils Absolute: 0 10*3/uL (ref 0.0–0.1)
EOS%: 1.5 % (ref 0.0–7.0)
Eosinophils Absolute: 0.1 10*3/uL (ref 0.0–0.5)
HCT: 41.6 % (ref 34.8–46.6)
HGB: 14.2 g/dL (ref 11.6–15.9)
LYMPH%: 18.6 % (ref 14.0–49.7)
MCH: 32.7 pg (ref 25.1–34.0)
MCHC: 34.1 g/dL (ref 31.5–36.0)
MCV: 95.8 fL (ref 79.5–101.0)
MONO#: 0.7 10*3/uL (ref 0.1–0.9)
MONO%: 11.8 % (ref 0.0–14.0)
NEUT#: 4 10*3/uL (ref 1.5–6.5)
NEUT%: 67.7 % (ref 38.4–76.8)
Platelets: 214 10*3/uL (ref 145–400)
RBC: 4.35 10*6/uL (ref 3.70–5.45)
RDW: 12.3 % (ref 11.2–14.5)
WBC: 5.9 10*3/uL (ref 3.9–10.3)
lymph#: 1.1 10*3/uL (ref 0.9–3.3)

## 2013-04-28 LAB — COMPREHENSIVE METABOLIC PANEL (CC13)
ALT: 23 U/L (ref 0–55)
AST: 23 U/L (ref 5–34)
Albumin: 3.3 g/dL — ABNORMAL LOW (ref 3.5–5.0)
Alkaline Phosphatase: 96 U/L (ref 40–150)
BUN: 13.2 mg/dL (ref 7.0–26.0)
CO2: 29 mEq/L (ref 22–29)
Calcium: 9.4 mg/dL (ref 8.4–10.4)
Chloride: 103 mEq/L (ref 98–109)
Creatinine: 0.8 mg/dL (ref 0.6–1.1)
Glucose: 92 mg/dl (ref 70–140)
Potassium: 4.2 mEq/L (ref 3.5–5.1)
Sodium: 143 mEq/L (ref 136–145)
Total Bilirubin: 0.81 mg/dL (ref 0.20–1.20)
Total Protein: 6.5 g/dL (ref 6.4–8.3)

## 2013-05-08 ENCOUNTER — Other Ambulatory Visit: Payer: Medicare Other

## 2013-05-12 ENCOUNTER — Ambulatory Visit (HOSPITAL_BASED_OUTPATIENT_CLINIC_OR_DEPARTMENT_OTHER): Payer: Medicare Other | Admitting: Oncology

## 2013-05-12 ENCOUNTER — Encounter: Payer: Self-pay | Admitting: Oncology

## 2013-05-12 ENCOUNTER — Telehealth: Payer: Self-pay | Admitting: Oncology

## 2013-05-12 VITALS — BP 150/79 | HR 80 | Temp 97.6°F | Resp 18 | Ht 68.0 in | Wt 158.8 lb

## 2013-05-12 DIAGNOSIS — D0590 Unspecified type of carcinoma in situ of unspecified breast: Secondary | ICD-10-CM

## 2013-05-12 DIAGNOSIS — D0511 Intraductal carcinoma in situ of right breast: Secondary | ICD-10-CM

## 2013-05-12 DIAGNOSIS — Z170 Estrogen receptor positive status [ER+]: Secondary | ICD-10-CM

## 2013-05-12 MED ORDER — EXEMESTANE 25 MG PO TABS: 25.0000 mg | ORAL_TABLET | Freq: Every day | ORAL | Status: DC

## 2013-05-12 MED ORDER — VENLAFAXINE HCL 75 MG PO TABS: 75.0000 mg | ORAL_TABLET | Freq: Every day | ORAL | Status: DC

## 2013-05-12 NOTE — Progress Notes (Signed)
OFFICE PROGRESS NOTE  CC Dr. Cyndia Bent Dr. 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  #2 patient was then begun on Aromasin 25 mg daily she also takes Effexor 75 mg daily for hot flashes.  CURRENT THERAPY:Aromasin 25 mg daily since July 2011.  INTERVAL HISTORY: Lindsey Price 67 y.o. female returns forfollowup visit today.  Overall she's doing well. She did have left rotator cuff surgery performed a week ago. She does have some pain. She is tolerating the Aromasin well without any significant problems. The flexor is certainly helping her with the hot flashes. She denies any fevers chills no night sweats. No hematuria hematochezia or melena no diarrhea or constipation. No musculoskeletal pain other than the shoulder. Remainder of the 10 point review of systems is negative.  MEDICAL HISTORY: Past Medical History  Diagnosis Date  . Hypertension   . Hyperlipidemia   . 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 Insomnia 09/11/2011  . Arthralgia 09/11/2011  . Thyroid disease     hypothyroidism  . Breast cancer     ALLERGIES:  is allergic to neosporin.  MEDICATIONS:  Current Outpatient Prescriptions  Medication Sig Dispense Refill  . ALBUTEROL IN Inhale into the lungs.      Marland Kitchen exemestane (AROMASIN) 25 MG tablet Take 1 tablet (25 mg total) by mouth daily after breakfast.  90 tablet  0  . ezetimibe-simvastatin (VYTORIN) 10-10 MG per tablet Take 1 tablet by mouth at bedtime.      Marland Kitchen levothyroxine (SYNTHROID, LEVOTHROID) 100 MCG tablet Take 100 mcg by mouth daily.      Marland Kitchen losartan (COZAAR) 50 MG tablet TAKE 1 TABLET BY MOUTH EVERY DAY FOR HIGH BLOOD PRESSURE  30 tablet  3  . venlafaxine (EFFEXOR) 75 MG tablet Take 1 tablet (75 mg total) by mouth daily.  90 tablet   12   No current facility-administered medications for this visit.    SURGICAL HISTORY:  Past Surgical History  Procedure Laterality Date  . Abdominal hysterectomy  1975    partial  . Breast lumpectomy  2011    right breast    REVIEW OF SYSTEMS:  Pertinent items are noted in HPI.   PHYSICAL EXAMINATION: General appearance: alert, cooperative and appears stated age Head: Normocephalic, without obvious abnormality, atraumatic Neck: no adenopathy, no carotid bruit, no JVD, supple, symmetrical, trachea midline and thyroid not enlarged, symmetric, no tenderness/mass/nodules Lymph nodes: Cervical, supraclavicular, and axillary nodes normal. Resp: clear to auscultation bilaterally and normal percussion bilaterally Back: symmetric, no curvature. ROM normal. No CVA tenderness. Cardio: regular rate and rhythm, S1, S2 normal, no murmur, click, rub or gallop and normal apical impulse GI: soft, non-tender; bowel sounds normal; no masses,  no organomegaly Extremities: extremities normal, atraumatic, no cyanosis or edema Neurologic: Alert and oriented X 3, normal strength and tone. Normal symmetric reflexes. Normal coordination and gait Bilateral breasts are examined right breast reveals well-healed lumpectomy scar no masses no nipple discharge retraction or inversion. Left breast reveals the recently biopsied area with area of ecchymosis otherwise no masses nipple discharge. ECOG PERFORMANCE STATUS: 0 - Asymptomatic  Blood pressure 150/79, pulse 80, temperature 97.6 F (36.4 C), temperature source Oral, resp. rate 18, height 5\' 8"  (1.727 m), weight 158 lb 12.8 oz (72.031 kg).  LABORATORY DATA: Lab Results  Component Value Date   WBC 5.9 04/28/2013   HGB 14.2 04/28/2013   HCT 41.6 04/28/2013   MCV 95.8 04/28/2013   PLT 214 04/28/2013      Chemistry      Component Value Date/Time   NA 143 04/28/2013 0918   NA 140 03/23/2011 0859   NA 141 05/27/2010 1030   K 4.2 04/28/2013 0918   K 4.3  03/23/2011 0859   K 3.9 05/27/2010 1030   CL 106 10/31/2012 0901   CL 104 03/23/2011 0859   CL 101 05/27/2010 1030   CO2 29 04/28/2013 0918   CO2 26 03/23/2011 0859   CO2 29 05/27/2010 1030   BUN 13.2 04/28/2013 0918   BUN 22 03/23/2011 0859   BUN 23* 05/27/2010 1030   CREATININE 0.8 04/28/2013 0918   CREATININE 0.78 03/23/2011 0859   CREATININE 0.8 05/27/2010 1030      Component Value Date/Time   CALCIUM 9.4 04/28/2013 0918   CALCIUM 9.4 03/23/2011 0859   CALCIUM 9.5 05/27/2010 1030   ALKPHOS 96 04/28/2013 0918   ALKPHOS 94 03/23/2011 0859   ALKPHOS 125* 05/27/2010 1030   AST 23 04/28/2013 0918   AST 29 03/23/2011 0859   AST 31 05/27/2010 1030   ALT 23 04/28/2013 0918   ALT 23 03/23/2011 0859   ALT 24 05/27/2010 1030   BILITOT 0.81 04/28/2013 0918   BILITOT 0.7 03/23/2011 0859   BILITOT 0.90 05/27/2010 1030       RADIOGRAPHIC STUDIES:    ASSESSMENT: 67 year old female with 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 The tumor was ER positive and because of this she was started on an aromatase inhibitor consisting of Aromasin after she received radiation therapy. of note patient did try tamoxifen but could not tolerate it and this is the reason why she is on the Aromasin now. Clinically patient is without evidence of disease   PLAN: we will continue to follow the patient every 6 months. Overall patient is doing well tolerating her adjuvant therapy which is Aromasin 25 mg daily. She does take Effexor 75 mg for hot flashes. This is helping her she is very active. She is up-to-date on her mammograms. I will see her back in 6 months time   All questions were answered. The patient knows to call the clinic with any problems, questions or concerns. We can certainly see the patient much sooner if necessary.  I spent 25 minutes counseling the patient face to face. The total time spent in the appointment was 30 minutes.    Drue Second,  MD Medical/Oncology [facility] [phone] (beeper) [phone] (Office)  05/12/2013, 9:19 AM

## 2013-05-12 NOTE — Addendum Note (Signed)
Addended by: Lorenza Evangelist A on: 05/12/2013 12:56 PM   Modules accepted: Orders

## 2013-05-12 NOTE — Patient Instructions (Addendum)
Continue aromasin 25 mg daily  We will see you back in 6 months 

## 2013-05-12 NOTE — Telephone Encounter (Signed)
, °

## 2013-05-23 LAB — LIPID PANEL
Cholesterol: 177 (ref 0–200)
HDL: 62 (ref 35–70)
LDL Cholesterol: 99
Triglycerides: 79 (ref 40–160)

## 2013-05-23 LAB — TSH: TSH: 0.86 (ref 0.41–5.90)

## 2013-07-10 ENCOUNTER — Other Ambulatory Visit: Payer: Self-pay

## 2013-07-22 ENCOUNTER — Encounter: Payer: Self-pay | Admitting: Oncology

## 2013-08-04 ENCOUNTER — Other Ambulatory Visit: Payer: Self-pay | Admitting: Oncology

## 2013-08-04 DIAGNOSIS — Z9889 Other specified postprocedural states: Secondary | ICD-10-CM

## 2013-09-04 HISTORY — PX: COLONOSCOPY: SHX174

## 2013-09-10 ENCOUNTER — Other Ambulatory Visit: Payer: Self-pay | Admitting: Oncology

## 2013-09-10 ENCOUNTER — Ambulatory Visit
Admission: RE | Admit: 2013-09-10 | Discharge: 2013-09-10 | Disposition: A | Discharge status: Home / Self Care | Payer: Medicare Other | Source: Ambulatory Visit | Attending: Oncology | Admitting: Oncology

## 2013-09-10 DIAGNOSIS — Z9889 Other specified postprocedural states: Secondary | ICD-10-CM

## 2013-09-10 HISTORY — PX: BREAST BIOPSY: SHX20

## 2013-11-03 ENCOUNTER — Encounter: Payer: Self-pay | Admitting: Oncology

## 2013-11-03 ENCOUNTER — Other Ambulatory Visit: Payer: Self-pay | Admitting: *Deleted

## 2013-11-03 DIAGNOSIS — D0510 Intraductal carcinoma in situ of unspecified breast: Secondary | ICD-10-CM

## 2013-11-07 ENCOUNTER — Other Ambulatory Visit: Payer: Self-pay | Admitting: *Deleted

## 2013-11-07 DIAGNOSIS — D0510 Intraductal carcinoma in situ of unspecified breast: Secondary | ICD-10-CM

## 2013-11-10 ENCOUNTER — Other Ambulatory Visit (HOSPITAL_BASED_OUTPATIENT_CLINIC_OR_DEPARTMENT_OTHER): Payer: Medicare Other

## 2013-11-10 DIAGNOSIS — D0590 Unspecified type of carcinoma in situ of unspecified breast: Secondary | ICD-10-CM

## 2013-11-10 DIAGNOSIS — D0510 Intraductal carcinoma in situ of unspecified breast: Secondary | ICD-10-CM

## 2013-11-10 LAB — COMPREHENSIVE METABOLIC PANEL (CC13)
ALT: 26 U/L (ref 0–55)
AST: 22 U/L (ref 5–34)
Albumin: 3.7 g/dL (ref 3.5–5.0)
Alkaline Phosphatase: 137 U/L (ref 40–150)
Anion Gap: 8 mEq/L (ref 3–11)
BUN: 24.7 mg/dL (ref 7.0–26.0)
CO2: 24 mEq/L (ref 22–29)
Calcium: 9.3 mg/dL (ref 8.4–10.4)
Chloride: 110 mEq/L — ABNORMAL HIGH (ref 98–109)
Creatinine: 0.8 mg/dL (ref 0.6–1.1)
Glucose: 103 mg/dl (ref 70–140)
Potassium: 4.1 mEq/L (ref 3.5–5.1)
Sodium: 142 mEq/L (ref 136–145)
Total Bilirubin: 0.48 mg/dL (ref 0.20–1.20)
Total Protein: 6.8 g/dL (ref 6.4–8.3)

## 2013-11-10 LAB — CBC WITH DIFFERENTIAL/PLATELET
BASO%: 0.3 % (ref 0.0–2.0)
Basophils Absolute: 0 10*3/uL (ref 0.0–0.1)
EOS%: 3 % (ref 0.0–7.0)
Eosinophils Absolute: 0.1 10*3/uL (ref 0.0–0.5)
HCT: 44.4 % (ref 34.8–46.6)
HGB: 14.7 g/dL (ref 11.6–15.9)
LYMPH%: 35.3 % (ref 14.0–49.7)
MCH: 31.2 pg (ref 25.1–34.0)
MCHC: 33.1 g/dL (ref 31.5–36.0)
MCV: 94.1 fL (ref 79.5–101.0)
MONO#: 0.6 10*3/uL (ref 0.1–0.9)
MONO%: 13.6 % (ref 0.0–14.0)
NEUT#: 2 10*3/uL (ref 1.5–6.5)
NEUT%: 47.8 % (ref 38.4–76.8)
Platelets: 226 10*3/uL (ref 145–400)
RBC: 4.72 10*6/uL (ref 3.70–5.45)
RDW: 12.3 % (ref 11.2–14.5)
WBC: 4.2 10*3/uL (ref 3.9–10.3)
lymph#: 1.5 10*3/uL (ref 0.9–3.3)

## 2013-11-10 LAB — LIPID PANEL
Cholesterol: 150 mg/dL (ref 0–200)
HDL: 44 mg/dL (ref >39)
LDL Cholesterol: 88 mg/dL (ref 0–99)
Total CHOL/HDL Ratio: 3.4 Ratio
Triglycerides: 91 mg/dL (ref <150)
VLDL: 18 mg/dL (ref 0–40)

## 2013-11-10 LAB — TSH CHCC: TSH: 0.636 m(IU)/L (ref 0.308–3.960)

## 2013-11-11 ENCOUNTER — Ambulatory Visit (INDEPENDENT_AMBULATORY_CARE_PROVIDER_SITE_OTHER): Payer: Medicare Other | Admitting: General Surgery

## 2013-11-17 ENCOUNTER — Encounter: Payer: Self-pay | Admitting: Oncology

## 2013-11-17 ENCOUNTER — Ambulatory Visit (HOSPITAL_BASED_OUTPATIENT_CLINIC_OR_DEPARTMENT_OTHER): Payer: Medicare Other | Admitting: Oncology

## 2013-11-17 VITALS — BP 123/83 | HR 83 | Temp 98.3°F | Resp 18 | Ht 68.0 in | Wt 158.6 lb

## 2013-11-17 DIAGNOSIS — Z170 Estrogen receptor positive status [ER+]: Secondary | ICD-10-CM

## 2013-11-17 DIAGNOSIS — N959 Unspecified menopausal and perimenopausal disorder: Secondary | ICD-10-CM

## 2013-11-17 DIAGNOSIS — M8580 Other specified disorders of bone density and structure, unspecified site: Secondary | ICD-10-CM

## 2013-11-17 DIAGNOSIS — D0590 Unspecified type of carcinoma in situ of unspecified breast: Secondary | ICD-10-CM

## 2013-11-17 DIAGNOSIS — D0510 Intraductal carcinoma in situ of unspecified breast: Secondary | ICD-10-CM

## 2013-11-17 MED ORDER — EXEMESTANE 25 MG PO TABS: 25.0000 mg | ORAL_TABLET | Freq: Every day | ORAL | Status: DC

## 2013-11-17 MED ORDER — VENLAFAXINE HCL ER 75 MG PO CP24: 75.0000 mg | ORAL_CAPSULE | Freq: Every day | ORAL | Status: DC

## 2013-11-17 NOTE — Telephone Encounter (Signed)
appts made and printed...td 

## 2013-11-17 NOTE — Progress Notes (Signed)
OFFICE PROGRESS NOTE  CC Dr. Neldon Mc Dr. 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  #2 patient was then begun on Aromasin 25 mg daily she also takes Effexor 75 mg daily for hot flashes.  CURRENT THERAPY:Aromasin 25 mg daily since July 2011.  INTERVAL HISTORY: Lindsey Price 68 y.o. female returns forfollowup visit today.  Overall she's doing well. Recently she had mammogram performed and she was found to have architectural distortion in the right breast. She had a biopsy performed which was negative for malignancy. In the meantime she continues to be on Aromasin 25 mg daily. She has seems to be tolerating it quite well. She is also on Effexor 75 mg daily for hot flashes that she experiences because of the Aromasin. She otherwise is doing well Today she denies any headaches double vision blurring of vision fevers chills night sweats. No shortness of breath chest pains palpitations. No abdominal pain no diarrhea or constipation. She has no easy bruising or bleeding. She has no myalgias and arthralgias. No peripheral paresthesias or gait disturbances. Remainder of the 10 point review of systems is negative.  MEDICAL HISTORY: Past Medical History  Diagnosis Date  . Hypertension   . Hyperlipidemia   . 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 Insomnia 09/11/2011  . Arthralgia 09/11/2011  . Thyroid disease     hypothyroidism  . Breast cancer     ALLERGIES:  is allergic to neosporin.  MEDICATIONS:  Current Outpatient Prescriptions  Medication Sig Dispense Refill  . ALBUTEROL IN Inhale into the lungs.      Marland Kitchen exemestane (AROMASIN) 25 MG tablet Take 1 tablet (25 mg total) by mouth daily after breakfast.  90 tablet  6  . ezetimibe-simvastatin (VYTORIN) 10-10 MG  per tablet Take 1 tablet by mouth at bedtime.      . Fluticasone-Salmeterol (ADVAIR) 100-50 MCG/DOSE AEPB Inhale 2 puffs into the lungs 2 (two) times daily.      Marland Kitchen levothyroxine (SYNTHROID, LEVOTHROID) 100 MCG tablet Take 100 mcg by mouth daily.      Marland Kitchen losartan (COZAAR) 50 MG tablet TAKE 1 TABLET BY MOUTH EVERY DAY FOR HIGH BLOOD PRESSURE  30 tablet  3  . venlafaxine XR (EFFEXOR-XR) 75 MG 24 hr capsule Take 75 mg by mouth daily.       No current facility-administered medications for this visit.    SURGICAL HISTORY:  Past Surgical History  Procedure Laterality Date  . Abdominal hysterectomy  1975    partial  . Breast lumpectomy  2011    right breast    REVIEW OF SYSTEMS:  Pertinent items are noted in HPI.   PHYSICAL EXAMINATION: General appearance: alert, cooperative and appears stated age Head: Normocephalic, without obvious abnormality, atraumatic Neck: no adenopathy, no carotid bruit, no JVD, supple, symmetrical, trachea midline and thyroid not enlarged, symmetric, no tenderness/mass/nodules Lymph nodes: Cervical, supraclavicular, and axillary nodes normal. Resp: clear to auscultation bilaterally and normal percussion bilaterally Back: symmetric, no curvature. ROM normal. No CVA tenderness. Cardio: regular rate and rhythm, S1, S2 normal, no murmur, click, rub or gallop and normal apical impulse GI: soft, non-tender; bowel sounds normal; no masses,  no organomegaly Extremities: extremities normal, atraumatic, no cyanosis or edema Neurologic: Alert and oriented X 3, normal strength and tone. Normal symmetric reflexes. Normal  coordination and gait Bilateral breasts are examined right breast reveals well-healed lumpectomy scar no masses no nipple discharge retraction or inversion. Left breast reveals the recently biopsied area with area of ecchymosis otherwise no masses nipple discharge. ECOG PERFORMANCE STATUS: 0 - Asymptomatic  Blood pressure 123/83, pulse 83, temperature 98.3 F  (36.8 C), temperature source Oral, resp. rate 18, height 5\' 8"  (1.727 m), weight 158 lb 9.6 oz (71.94 kg).  LABORATORY DATA: Lab Results  Component Value Date   WBC 4.2 11/10/2013   HGB 14.7 11/10/2013   HCT 44.4 11/10/2013   MCV 94.1 11/10/2013   PLT 226 11/10/2013      Chemistry      Component Value Date/Time   NA 142 11/10/2013 0822   NA 140 03/23/2011 0859   NA 141 05/27/2010 1030   K 4.1 11/10/2013 0822   K 4.3 03/23/2011 0859   K 3.9 05/27/2010 1030   CL 106 10/31/2012 0901   CL 104 03/23/2011 0859   CL 101 05/27/2010 1030   CO2 24 11/10/2013 0822   CO2 26 03/23/2011 0859   CO2 29 05/27/2010 1030   BUN 24.7 11/10/2013 0822   BUN 22 03/23/2011 0859   BUN 23* 05/27/2010 1030   CREATININE 0.8 11/10/2013 0822   CREATININE 0.78 03/23/2011 0859   CREATININE 0.8 05/27/2010 1030      Component Value Date/Time   CALCIUM 9.3 11/10/2013 0822   CALCIUM 9.4 03/23/2011 0859   CALCIUM 9.5 05/27/2010 1030   ALKPHOS 137 11/10/2013 0822   ALKPHOS 94 03/23/2011 0859   ALKPHOS 125* 05/27/2010 1030   AST 22 11/10/2013 0822   AST 29 03/23/2011 0859   AST 31 05/27/2010 1030   ALT 26 11/10/2013 0822   ALT 23 03/23/2011 0859   ALT 24 05/27/2010 1030   BILITOT 0.48 11/10/2013 0822   BILITOT 0.7 03/23/2011 0859   BILITOT 0.90 05/27/2010 1030       RADIOGRAPHIC STUDIES:    ASSESSMENT: 68 year old female with  1.  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 The tumor was ER positive and because of this she was started on an aromatase inhibitor consisting of Aromasin after she received radiation therapy. Patient continues on adjuvant Aromasin 25 mg daily. Overall she is tolerating it well. No evidence of recurrent disease.  2. recent mammogram revealed subareolar architecture distortion in the right breast. She is status post biopsy which revealed no evidence of malignancy.  3. Hot flashes: Patient is on Effexor XR 75 mg daily with good response.  PLAN:  #1 patient will  continue Aromasin 25 mg daily.  #2 prescriptions for Aromasin and Effexor were given to the patient.  #3 patient will be seen back in 6 months time for followup  All questions were answered. The patient knows to call the clinic with any problems, questions or concerns. We can certainly see the patient much sooner if necessary.  I spent 10 minutes counseling the patient face to face. The total time spent in the appointment was 15 minutes.    Marcy Panning, MD Medical/Oncology [facility] [phone] (beeper) [phone] (Office)  11/17/2013, 10:03 AM

## 2013-11-17 NOTE — Patient Instructions (Signed)
Continue aromasin 25 mg daily  We discussed your pathology  We will need a bone density scan in the next few weeks

## 2013-11-28 ENCOUNTER — Ambulatory Visit
Admission: RE | Admit: 2013-11-28 | Discharge: 2013-11-28 | Disposition: A | Discharge status: Home / Self Care | Payer: Self-pay | Source: Ambulatory Visit | Attending: Oncology | Admitting: Oncology

## 2013-11-28 DIAGNOSIS — M8580 Other specified disorders of bone density and structure, unspecified site: Secondary | ICD-10-CM

## 2013-12-03 ENCOUNTER — Other Ambulatory Visit: Payer: Self-pay | Admitting: Oncology

## 2013-12-03 MED ORDER — ALENDRONATE SODIUM 70 MG PO TABS: 70.0000 mg | ORAL_TABLET | ORAL | Status: DC

## 2013-12-05 ENCOUNTER — Telehealth: Payer: Self-pay

## 2013-12-05 NOTE — Telephone Encounter (Signed)
Per KK note below, let pt know her scan showed low bone mass and that [place] recommended fosamax.  Pt voiced understanding but said she is not sure she wants to take it d/t side effects.  Asked for a copy of her scan results by email.  Not able to send by email d/t HIPPA.  Sent copy by MyChart and put copy in the mail.

## 2013-12-05 NOTE — Telephone Encounter (Signed)
LMOVM requesting call back.

## 2013-12-05 NOTE — Telephone Encounter (Signed)
Message copied by Prentiss Bells on Fri Dec 05, 2013  1:53 PM ------      Message from: Deatra Robinson      Created: Wed Dec 03, 2013  4:54 PM       Bone density: low bone mass            Recommend fosamax 70 mg q weekly, take on empty stomach in the morning with 16 oz glass of water and stay upright for 1 hour.            Prescription sent to pharmacy ------

## 2013-12-08 ENCOUNTER — Encounter: Payer: Self-pay | Admitting: Oncology

## 2013-12-08 ENCOUNTER — Encounter (INDEPENDENT_AMBULATORY_CARE_PROVIDER_SITE_OTHER): Payer: Self-pay | Admitting: General Surgery

## 2013-12-08 ENCOUNTER — Ambulatory Visit (INDEPENDENT_AMBULATORY_CARE_PROVIDER_SITE_OTHER): Payer: Medicare Other | Admitting: General Surgery

## 2013-12-08 VITALS — BP 132/90 | HR 70 | Temp 97.9°F | Resp 14 | Ht 67.0 in | Wt 159.6 lb

## 2013-12-08 DIAGNOSIS — D0510 Intraductal carcinoma in situ of unspecified breast: Secondary | ICD-10-CM

## 2013-12-08 DIAGNOSIS — D0590 Unspecified type of carcinoma in situ of unspecified breast: Secondary | ICD-10-CM

## 2013-12-08 NOTE — Patient Instructions (Signed)
Continue regular self exams Ask Dr. Humphrey Rolls about stopping aromasin given decrease in bone density

## 2013-12-08 NOTE — Progress Notes (Signed)
Subjective:     Patient ID: Lindsey Price, female   DOB: 1945-09-11, 68 y.o.   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 Since her last visit she has had her rotator cuff repaired by Dr. Noemi Chapel. She also had a recent mammogram that showed an abnormality in the right breast. This was biopsied and came back as dense fibrosis. Otherwise she has been well and has no complaints today. She continues to have some stable soreness in the right breast. She has been taking Aromasin in her recent bone density study shows that she continues to lose bone density  Review of Systems  Constitutional: Negative.   HENT: Negative.   Eyes: Negative.   Respiratory: Negative.   Cardiovascular: Negative.   Gastrointestinal: Negative.   Endocrine: Negative.   Genitourinary: Negative.   Musculoskeletal: Negative.   Skin: Negative.   Allergic/Immunologic: Negative.   Neurological: Negative.   Hematological: Negative.   Psychiatric/Behavioral: Negative.        Objective:   Physical Exam  Constitutional: She is oriented to person, place, and time. She appears well-developed and well-nourished.  HENT:  Head: Normocephalic and atraumatic.  Eyes: Conjunctivae and EOM are normal. Pupils are equal, round, and reactive to light.  Neck: Normal range of motion. Neck supple.  Cardiovascular: Normal rate, regular rhythm and normal heart sounds.   Pulmonary/Chest: Effort normal and breath sounds normal.  There is some slight nodularity to the scar of the right breast secondary to radiation. Otherwise there is no other palpable mass in either breast. There is no palpable axillary, supraclavicular, or cervical lymphadenopathy  Abdominal: Soft. Bowel sounds are normal.  Musculoskeletal: Normal range of motion.  Lymphadenopathy:    She has no cervical adenopathy.  Neurological: She is alert and oriented to person, place, and time.  Skin: Skin is  warm and dry.  Psychiatric: She has a normal mood and affect. Her behavior is normal.       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 I have encouraged her to speak to the medical oncologist about coming off the aromasin giving the decline in her bone density. I will plan to see her back in one year.

## 2013-12-09 ENCOUNTER — Telehealth: Payer: Self-pay | Admitting: Oncology

## 2013-12-09 ENCOUNTER — Telehealth: Payer: Self-pay

## 2013-12-09 NOTE — Progress Notes (Signed)
Report rcvd from [facility] d[facility] 11/28/2013.  Provided to [place]

## 2013-12-09 NOTE — Telephone Encounter (Signed)
LMVOM requesting call back

## 2013-12-09 NOTE — Telephone Encounter (Signed)
Pt wants to discuss fosamax prior to starting prescription.   Let her know KK not available until July.  She voiced understanding and is fine with seeing LC.  POF sent.

## 2013-12-09 NOTE — Telephone Encounter (Signed)
S/w the pt and she is aware of her April appt on 12/15/2013.

## 2013-12-15 ENCOUNTER — Encounter: Payer: Self-pay | Admitting: Adult Health

## 2013-12-15 ENCOUNTER — Ambulatory Visit (HOSPITAL_BASED_OUTPATIENT_CLINIC_OR_DEPARTMENT_OTHER): Payer: Medicare Other | Admitting: Adult Health

## 2013-12-15 VITALS — BP 143/84 | HR 78 | Temp 98.5°F | Resp 18 | Ht 67.0 in | Wt 160.6 lb

## 2013-12-15 DIAGNOSIS — M899 Disorder of bone, unspecified: Secondary | ICD-10-CM

## 2013-12-15 DIAGNOSIS — D0590 Unspecified type of carcinoma in situ of unspecified breast: Secondary | ICD-10-CM

## 2013-12-15 DIAGNOSIS — Z923 Personal history of irradiation: Secondary | ICD-10-CM

## 2013-12-15 DIAGNOSIS — D0510 Intraductal carcinoma in situ of unspecified breast: Secondary | ICD-10-CM

## 2013-12-15 DIAGNOSIS — M949 Disorder of cartilage, unspecified: Secondary | ICD-10-CM

## 2013-12-15 NOTE — Progress Notes (Signed)
Hematology and Oncology Follow Up Visit  Lindsey Price [id number] 09/23/1945 68 y.o. 12/15/2013 11:07 AM     Principle Diagnosis:Lindsey Price 68 y.o. female with h/o DCIS of the right breast.     Prior Therapy: #1 status post lumpectomy of the right breast interpreted 2011 followed by radiation therapy which was completed on 12/17/2009.   #2 patient was then begun on Aromasin 25 mg daily she also takes Effexor 75 mg daily for hot flashes.  Current therapy:  Aromasin 25 mg daily  Interim History: Lindsey Price 68 y.o. female who returns for f/u and evaluation today of her recent bone density exam. She is here today for evaluation and is concerned about being started on Fosamax weekly for her bone density results.  Her bone density was performed on 11/28/13. It demonstrated osteopenia with a t score of -1.1 in the lumbar spine and -1.2 in the left femur neck.  Her fracture risk was determined as moderate.  Her last bone density was in 2011 and it was normal.  The patient is concerned about starting on Fosamax.  She already has significant reflux and takes Nexium daily for this.  She is taking Calcium, Vitamin D and weight bearing exercises since receiving these results.  Otherwise, the patient is doing well and without questions and concerns.    Medications:  Current Outpatient Prescriptions  Medication Sig Dispense Refill  . ALBUTEROL IN Inhale into the lungs.      Marland Kitchen esomeprazole (NEXIUM) 20 MG capsule Take 20 mg by mouth 2 (two) times daily before a meal.      . exemestane (AROMASIN) 25 MG tablet Take 1 tablet (25 mg total) by mouth daily after breakfast.  90 tablet  6  . ezetimibe-simvastatin (VYTORIN) 10-10 MG per tablet Take 1 tablet by mouth at bedtime.      . Fluticasone-Salmeterol (ADVAIR) 100-50 MCG/DOSE AEPB Inhale 2 puffs into the lungs 2 (two) times daily.      Marland Kitchen levothyroxine (SYNTHROID, LEVOTHROID) 100 MCG tablet Take 100 mcg by mouth daily.      Marland Kitchen losartan (COZAAR)  50 MG tablet TAKE 1 TABLET BY MOUTH EVERY DAY FOR HIGH BLOOD PRESSURE  30 tablet  3  . montelukast (SINGULAIR) 10 MG tablet       . venlafaxine XR (EFFEXOR-XR) 75 MG 24 hr capsule Take 1 capsule (75 mg total) by mouth daily.  30 capsule  6  . alendronate (FOSAMAX) 70 MG tablet Take 1 tablet (70 mg total) by mouth once a week. Take with a full glass of water on an empty stomach.  12 tablet  3   No current facility-administered medications for this visit.     Allergies:  Allergies  Allergen Reactions  . Neosporin [Neomycin-Polymyxin-Gramicidin]     Medical History: Past Medical History  Diagnosis Date  . Hypertension   . Hyperlipidemia   . 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 Insomnia 09/11/2011  . Arthralgia 09/11/2011  . Thyroid disease     hypothyroidism  . Breast cancer     Surgical History:  Past Surgical History  Procedure Laterality Date  . Abdominal hysterectomy  1975    partial  . Breast lumpectomy  2011    right breast  . Rotator cuff repair Left 04/2013    Dr. Para March     Review of Systems: A 10 point review of systems was conducted and is otherwise negative except for what is noted above.  Physical Exam: Blood pressure 143/84, pulse 78, temperature 98.5 F (36.9 C), temperature source Oral, resp. rate 18, height 5\' 7"  (1.702 m), weight 160 lb 9.6 oz (72.848 kg). GENERAL: Patient is a well appearing female in no acute distress HEENT:  Sclerae anicteric.  Oropharynx clear and moist. No ulcerations or evidence of oropharyngeal candidiasis. Neck is supple.  NODES:  No cervical, supraclavicular, or axillary lymphadenopathy palpated.  BREAST EXAM:  Deferred. LUNGS:  Clear to auscultation bilaterally.  No wheezes or rhonchi. HEART:  Regular rate and rhythm. No murmur appreciated. ABDOMEN:  Soft, nontender.  Positive, normoactive bowel sounds. No organomegaly palpated. MSK:  No focal spinal tenderness to palpation. Full range of motion bilaterally  in the upper extremities. EXTREMITIES:  No peripheral edema.   SKIN:  Clear with no obvious rashes or skin changes. No nail dyscrasia. NEURO:  Nonfocal. Well oriented.  Appropriate affect. ECOG PERFORMANCE STATUS: 1 - Symptomatic but completely ambulatory   Lab Results: Lab Results  Component Value Date   WBC 4.2 11/10/2013   HGB 14.7 11/10/2013   HCT 44.4 11/10/2013   MCV 94.1 11/10/2013   PLT 226 11/10/2013     Chemistry      Component Value Date/Time   NA 142 11/10/2013 0822   NA 140 03/23/2011 0859   NA 141 05/27/2010 1030   K 4.1 11/10/2013 0822   K 4.3 03/23/2011 0859   K 3.9 05/27/2010 1030   CL 106 10/31/2012 0901   CL 104 03/23/2011 0859   CL 101 05/27/2010 1030   CO2 24 11/10/2013 0822   CO2 26 03/23/2011 0859   CO2 29 05/27/2010 1030   BUN 24.7 11/10/2013 0822   BUN 22 03/23/2011 0859   BUN 23* 05/27/2010 1030   CREATININE 0.8 11/10/2013 0822   CREATININE 0.78 03/23/2011 0859   CREATININE 0.8 05/27/2010 1030      Component Value Date/Time   CALCIUM 9.3 11/10/2013 0822   CALCIUM 9.4 03/23/2011 0859   CALCIUM 9.5 05/27/2010 1030   ALKPHOS 137 11/10/2013 0822   ALKPHOS 94 03/23/2011 0859   ALKPHOS 125* 05/27/2010 1030   AST 22 11/10/2013 0822   AST 29 03/23/2011 0859   AST 31 05/27/2010 1030   ALT 26 11/10/2013 0822   ALT 23 03/23/2011 0859   ALT 24 05/27/2010 1030   BILITOT 0.48 11/10/2013 0822   BILITOT 0.7 03/23/2011 0859   BILITOT 0.90 05/27/2010 1030       Radiological Studies: CLINICAL DATA: Postmenopausal. Taking Aromasin for breast cancer  EXAM:  DUAL X-RAY ABSORPTIOMETRY (DXA) FOR BONE MINERAL DENSITY  FINDINGS:  AP LUMBAR SPINE  Bone Mineral Density (BMD): 0.924 g/cm2  Young Adult T-Score: -1.1  Z-Score: 1.0  LEFT FEMUR NECK  Bone Mineral Density (BMD): 0.716 g/cm2  Young Adult T-Score: -1.2  Z-Score: 0.6  ASSESSMENT: Patient's diagnostic category is LOW BONE MASS by WHO  Criteria.  FRACTURE RISK: Moderate  FRAX: Based on the [place] model, the 10 year   probability of a major osteoporotic fracture is 9.2%. The 10 year  probability of a hip fracture is 1.1%.  COMPARISON: 06/06/2010. Since that time, there has been a  statistically significant 7.8% decrease in bone mineral density of  the lumbar spine and a statistically insignificant 2.7% decrease in  bone mineral density of the left hip.  Effective therapies are available in the form of bisphosphonates,  selective estrogen receptor modulators, biologic agents, and hormone  replacement therapy (for women). All patients should  ensure an  adequate intake of dietary calcium (1200 mg daily) and vitamin D  (800 IU daily) unless contraindicated.  All treatment decisions require clinical judgment and consideration  of individual patient factors, including patient preferences,  co-morbidities, previous drug use, risk factors not captured in the  FRAX model (e.g., frailty, falls, vitamin D deficiency, increased  bone turnover, interval significant decline in bone density) and  possible under- or over-estimation of fracture risk by FRAX.  The National Osteoporosis Foundation recommends that FDA-approved  medical therapies be considered in postmenopausal women and men age  13 or older with a:  1. Hip or vertebral (clinical or morphometric) fracture.  2. T-score of -2.5 or lower at the spine or hip.  3. Ten-year fracture probability by FRAX of 3% or greater for hip  fracture or 20% or greater for major osteoporotic fracture.  People with diagnosed cases of osteoporosis or at high risk for  fracture should have regular bone mineral density tests. For  patients eligible for Medicare, routine testing is allowed once  every 2 years. The testing frequency can be increased to one year  for patients who have rapidly progressing disease, those who are  receiving or discontinuing medical therapy to restore bone mass, or  have additional risk factors.  [facility]) Criteria:  Normal:  T-scores from +1.0 to -1.0  Low Bone Mass (Osteopenia): T-scores between -1.0 and -2.5  Osteoporosis: T-scores -2.5 and below  Comparison to Reference Population:  T-score is the key measure used in the diagnosis of osteoporosis and  relative risk determination for fracture. It provides a value for  bone mass relative to the mean bone mass of a young adult reference  population expressed in terms of standard deviation (SD).  Z-score is the age-matched score showing the patient's values  compared to a population matched for age, sex, and race. This is  also expressed in terms of standard deviation. The patient may have  values that compare favorably to the age-matched values and still be  at increased risk for fracture.  Electronically Signed  By: Enrique Sack M.D.  On: 11/28/2013 18:53   Assessment and Plan: Lindsey Price 68 y.o. female with  1. DCIS of the right breast s/p lumpectomy and radiation.  She is currently taking Aromasin and Effexor daily and is tolerating these medications well.  Her last appointment for 6 month follow up was on 11/17/2013.  2.  Osteopenia.  We discussed this in detail today.  Though her bone density has decreased from her prior exam 4 years ago.  Her T score was -1.2 and -1.1 and reviewed above.  She had not yet been committed to taking Calcium, Vitamin D and performing weight bearing exercises.  She has started doing this consistently since then and will not take the Fosamax.  We will get another bone density in 2 years and if it has continued to significantly decline, we will discuss bisphosphanate therapy again at that point.    The patient will return in about 5.5 months for her next labs, appointment and f/u.  She knows to contact us in the interim with any questions or concerns.  We can certainly see her sooner if needed.   I spent 15 minutes counseling the patient face to face.  The total time spent in the appointment was 30 minutes.  Minette Headland, [place] [phone] 12/15/2013 11:07 AM

## 2014-01-19 ENCOUNTER — Other Ambulatory Visit: Payer: Self-pay | Admitting: Gastroenterology

## 2014-03-11 ENCOUNTER — Encounter: Payer: Self-pay | Admitting: Adult Health

## 2014-03-23 ENCOUNTER — Encounter (HOSPITAL_COMMUNITY): Payer: Self-pay | Admitting: Pharmacy Technician

## 2014-04-03 ENCOUNTER — Encounter (HOSPITAL_COMMUNITY): Payer: Self-pay | Admitting: *Deleted

## 2014-04-14 ENCOUNTER — Ambulatory Visit (HOSPITAL_COMMUNITY)
Admission: RE | Admit: 2014-04-14 | Discharge: 2014-04-14 | Disposition: A | Discharge status: Home / Self Care | Payer: Medicare Other | Source: Ambulatory Visit | Attending: Gastroenterology | Admitting: Gastroenterology

## 2014-04-14 ENCOUNTER — Encounter: Payer: Self-pay | Admitting: Adult Health

## 2014-04-14 ENCOUNTER — Encounter (HOSPITAL_COMMUNITY): Payer: Self-pay | Admitting: *Deleted

## 2014-04-14 ENCOUNTER — Encounter (HOSPITAL_COMMUNITY)
Admission: RE | Disposition: A | Discharge status: Home / Self Care | Payer: Self-pay | Source: Ambulatory Visit | Attending: Gastroenterology

## 2014-04-14 ENCOUNTER — Encounter (HOSPITAL_COMMUNITY): Payer: Medicare Other | Admitting: Anesthesiology

## 2014-04-14 ENCOUNTER — Ambulatory Visit (HOSPITAL_COMMUNITY): Payer: Medicare Other | Admitting: Anesthesiology

## 2014-04-14 DIAGNOSIS — I10 Essential (primary) hypertension: Secondary | ICD-10-CM | POA: Insufficient documentation

## 2014-04-14 DIAGNOSIS — D126 Benign neoplasm of colon, unspecified: Secondary | ICD-10-CM | POA: Diagnosis not present

## 2014-04-14 DIAGNOSIS — Z853 Personal history of malignant neoplasm of breast: Secondary | ICD-10-CM | POA: Diagnosis not present

## 2014-04-14 DIAGNOSIS — E039 Hypothyroidism, unspecified: Secondary | ICD-10-CM | POA: Diagnosis not present

## 2014-04-14 DIAGNOSIS — Z79899 Other long term (current) drug therapy: Secondary | ICD-10-CM | POA: Diagnosis not present

## 2014-04-14 DIAGNOSIS — Z1211 Encounter for screening for malignant neoplasm of colon: Secondary | ICD-10-CM | POA: Insufficient documentation

## 2014-04-14 HISTORY — PX: COLONOSCOPY WITH PROPOFOL: SHX5780

## 2014-04-14 HISTORY — DX: Unspecified asthma, uncomplicated: J45.909

## 2014-04-14 LAB — HM COLONOSCOPY

## 2014-04-14 SURGERY — COLONOSCOPY WITH PROPOFOL
Anesthesia: Monitor Anesthesia Care

## 2014-04-14 MED ORDER — PROPOFOL 10 MG/ML IV BOLUS
INTRAVENOUS | Status: AC
  Filled 2014-04-14: qty 20

## 2014-04-14 MED ORDER — PROPOFOL 10 MG/ML IV BOLUS
INTRAVENOUS | Status: DC | PRN
  Administered 2014-04-14 (×4): 20 mg via INTRAVENOUS

## 2014-04-14 MED ORDER — SODIUM CHLORIDE 0.9 % IV SOLN: INTRAVENOUS | Status: DC

## 2014-04-14 MED ORDER — KETAMINE HCL 10 MG/ML IJ SOLN
INTRAMUSCULAR | Status: DC | PRN
  Administered 2014-04-14: 20 mg via INTRAVENOUS

## 2014-04-14 MED ORDER — LIDOCAINE HCL (CARDIAC) 20 MG/ML IV SOLN
INTRAVENOUS | Status: DC | PRN
  Administered 2014-04-14: 100 mg via INTRAVENOUS

## 2014-04-14 MED ORDER — PROPOFOL INFUSION 10 MG/ML OPTIME
INTRAVENOUS | Status: DC | PRN
  Administered 2014-04-14: 100 ug/kg/min via INTRAVENOUS

## 2014-04-14 MED ORDER — ONDANSETRON HCL 4 MG/2ML IJ SOLN
INTRAMUSCULAR | Status: DC | PRN
  Administered 2014-04-14: 4 mg via INTRAVENOUS

## 2014-04-14 MED ORDER — MIDAZOLAM HCL 5 MG/5ML IJ SOLN
INTRAMUSCULAR | Status: DC | PRN
  Administered 2014-04-14: 2 mg via INTRAVENOUS

## 2014-04-14 MED ORDER — LACTATED RINGERS IV SOLN
INTRAVENOUS | Status: DC
  Administered 2014-04-14: 10:00:00 via INTRAVENOUS

## 2014-04-14 MED ORDER — ONDANSETRON HCL 4 MG/2ML IJ SOLN
INTRAMUSCULAR | Status: AC
  Filled 2014-04-14: qty 2

## 2014-04-14 MED ORDER — LIDOCAINE HCL (CARDIAC) 20 MG/ML IV SOLN
INTRAVENOUS | Status: AC
  Filled 2014-04-14: qty 5

## 2014-04-14 MED ORDER — MIDAZOLAM HCL 2 MG/2ML IJ SOLN
INTRAMUSCULAR | Status: AC
  Filled 2014-04-14: qty 2

## 2014-04-14 MED ORDER — PROPOFOL 10 MG/ML IV BOLUS
INTRAVENOUS | Status: AC
Start: 2014-04-14 — End: 2014-04-14
  Filled 2014-04-14: qty 20

## 2014-04-14 SURGICAL SUPPLY — 22 items

## 2014-04-14 NOTE — Telephone Encounter (Signed)
Called patient informing her the rescheduled appointment and provider are pending.  Discussed plan to bring a script from PCP to [facility] visit if seen here before appointment with PCP on 05-27-2014.  Second option is to have all labs drawn at PCP office on 05-27-2014 with results faxed or brought to [facility] pending follow up appointment.

## 2014-04-14 NOTE — Anesthesia Postprocedure Evaluation (Signed)
  Anesthesia Post-op Note  Patient: Lindsey Price  Procedure(s) Performed: Procedure(s) (LRB): COLONOSCOPY WITH PROPOFOL (N/A)  Patient Location: PACU  Anesthesia Type: MAC  Level of Consciousness: awake and alert   Airway and Oxygen Therapy: Patient Spontanous Breathing  Post-op Pain: mild  Post-op Assessment: Post-op Vital signs reviewed, Patient's Cardiovascular Status Stable, Respiratory Function Stable, Patent Airway and No signs of Nausea or vomiting  Last Vitals:  Filed Vitals:   04/14/14 1145  BP: 152/79  Pulse: 58  Temp:   Resp: 18    Post-op Vital Signs: stable   Complications: No apparent anesthesia complications

## 2014-04-14 NOTE — Anesthesia Preprocedure Evaluation (Signed)
Anesthesia Evaluation  Patient identified by MRN, date of birth, ID band Patient awake    Reviewed: Allergy & Precautions, H&P , NPO status , Patient's Chart, lab work & pertinent test results  Airway Mallampati: II TM Distance: >3 FB Neck ROM: Full    Dental no notable dental hx.    Pulmonary former smoker,  breath sounds clear to auscultation  Pulmonary exam normal       Cardiovascular hypertension, Pt. on medications Rhythm:Regular Rate:Normal     Neuro/Psych negative neurological ROS  negative psych ROS   GI/Hepatic negative GI ROS, Neg liver ROS,   Endo/Other  negative endocrine ROS  Renal/GU negative Renal ROS  negative genitourinary   Musculoskeletal negative musculoskeletal ROS (+)   Abdominal   Peds negative pediatric ROS (+)  Hematology negative hematology ROS (+)   Anesthesia Other Findings   Reproductive/Obstetrics negative OB ROS                           Anesthesia Physical Anesthesia Plan  ASA: II  Anesthesia Plan: MAC   Post-op Pain Management:    Induction:   Airway Management Planned: Simple Face Mask  Additional Equipment:   Intra-op Plan:   Post-operative Plan:   Informed Consent: I have reviewed the patients History and Physical, chart, labs and discussed the procedure including the risks, benefits and alternatives for the proposed anesthesia with the patient or authorized representative who has indicated his/her understanding and acceptance.   Dental advisory given  Plan Discussed with: CRNA  Anesthesia Plan Comments:         Anesthesia Quick Evaluation

## 2014-04-14 NOTE — Discharge Instructions (Signed)
Colonoscopy, Care After °These instructions give you information on caring for yourself after your procedure. Your doctor may also give you more specific instructions. Call your doctor if you have any problems or questions after your procedure. °HOME CARE °· Do not drive for 24 hours. °· Do not sign important papers or use machinery for 24 hours. °· You may shower. °· You may go back to your usual activities, but go slower for the first 24 hours. °· Take rest breaks often during the first 24 hours. °· Walk around or use warm packs on your belly (abdomen) if you have belly cramping or gas. °· Drink enough fluids to keep your pee (urine) clear or pale yellow. °· Resume your normal diet. Avoid heavy or fried foods. °· Avoid drinking alcohol for 24 hours or as told by your doctor. °· Only take medicines as told by your doctor. °If a tissue sample (biopsy) was taken during the procedure:  °· Do not take aspirin or blood thinners for 7 days, or as told by your doctor. °· Do not drink alcohol for 7 days, or as told by your doctor. °· Eat soft foods for the first 24 hours. °GET HELP IF: °You still have a small amount of blood in your poop (stool) 2-3 days after the procedure. °GET HELP RIGHT AWAY IF: °· You have more than a small amount of blood in your poop. °· You see clumps of tissue (blood clots) in your poop. °· Your belly is puffy (swollen). °· You feel sick to your stomach (nauseous) or throw up (vomit). °· You have a fever. °· You have belly pain that gets worse and medicine does not help. °MAKE SURE YOU: °· Understand these instructions. °· Will watch your condition. °· Will get help right away if you are not doing well or get worse. °Document Released: 09/23/2010 Document Revised: 08/26/2013 Document Reviewed: 04/28/2013 °ExitCare® Patient Information ©2015 [employer]. This information is not intended to replace advice given to you by your health care provider. Make sure you discuss any questions you have with  your health care provider. ° °

## 2014-04-14 NOTE — Op Note (Signed)
Procedure: Screening colonoscopy. Normal screening colonoscopy performed on 12/30/2003  Endoscopist: Earle Gell  Premedication: Propofol administered by anesthesia  Procedure: The patient was placed in the left lateral decubitus position. Anal inspection and digital rectal exam were normal. The Pentax pediatric colonoscope was introduced into the rectum and advanced to the cecum. A normal-appearing appendiceal orifice was identified. A normal-appearing ileocecal bowel was intubated and the terminal ileum inspected. Colonic preparation for the exam today was good.  Rectum. Normal. Retroflexed view of the distal rectum normal  Sigmoid colon. From the mid sigmoid colon, a 3 mm sessile polyp was removed with the cold biopsy forceps and a 5 mm sessile polyp was removed with the cold snare.  Descending colon. Normal  Splenic flexure. Normal  Transverse colon. Normal  Hepatic flexure. Normal  Ascending colon. A 3 mm sessile polyp was removed with the cold biopsy forceps  Cecum and ileocecal valve. Normal  Assessment:  #1. A 3 mm sessile polyp was removed from the ascending colon, a 3 mm sessile polyp was removed from the mid sigmoid colon, and a 5 mm sessile polyp was removed from the mid sigmoid colon. All polyps were submitted in one bottle for pathological evaluation  Recommendation: If colon polyp returns neoplastic pathologically, the patient should undergo a surveillance colonoscopy in 5 years.

## 2014-04-14 NOTE — H&P (Signed)
  Procedure: Screening colonoscopy. Normal screening colonoscopy performed on 12/30/2003.  History: The patient is a 68 year old female born 07/28/1946. She is scheduled to undergo a repeat screening colonoscopy with polypectomy to prevent colon cancer.  Past medical history: Hypertension. Hypothyroidism. Right breast cancer. Hysterectomy. Right eye surgery. Right lumpectomy in 2011.  Allergies: Neosporin  Exam: The patient is alert and lying comfortably on the endoscopy stretcher. Abdomen is soft and nontender to palpation. Lungs are clear to auscultation. Cardiac exam reveals a regular rhythm.  Plan: Proceed with screening colonoscopy

## 2014-04-14 NOTE — Transfer of Care (Signed)
Immediate Anesthesia Transfer of Care Note  Patient: Lindsey Price  Procedure(s) Performed: Procedure(s) (LRB): COLONOSCOPY WITH PROPOFOL (N/A)  Patient Location: PACU  Anesthesia Type: MAC  Level of Consciousness: sedated, patient cooperative and responds to stimulation  Airway & Oxygen Therapy: Patient Spontanous Breathing and Patient connected to face mask oxgen  Post-op Assessment: Report given to PACU RN and Post -op Vital signs reviewed and stable  Post vital signs: Reviewed and stable  Complications: No apparent anesthesia complications

## 2014-04-15 ENCOUNTER — Encounter (HOSPITAL_COMMUNITY): Payer: Self-pay | Admitting: Gastroenterology

## 2014-04-15 ENCOUNTER — Telehealth: Payer: Self-pay | Admitting: Hematology and Oncology

## 2014-05-04 ENCOUNTER — Encounter: Payer: Self-pay | Admitting: Adult Health

## 2014-05-05 ENCOUNTER — Other Ambulatory Visit: Payer: Self-pay | Admitting: *Deleted

## 2014-05-05 ENCOUNTER — Other Ambulatory Visit: Payer: Self-pay

## 2014-05-05 DIAGNOSIS — D0510 Intraductal carcinoma in situ of unspecified breast: Secondary | ICD-10-CM

## 2014-05-05 NOTE — Progress Notes (Signed)
Patient requested TSH and Lipid panel for PCP via MyChart as covered by previous oncologist.  Verbal order received from Dr. Lindi Adie.

## 2014-05-14 ENCOUNTER — Other Ambulatory Visit: Payer: Self-pay

## 2014-05-14 DIAGNOSIS — D0510 Intraductal carcinoma in situ of unspecified breast: Secondary | ICD-10-CM

## 2014-05-15 ENCOUNTER — Other Ambulatory Visit (HOSPITAL_BASED_OUTPATIENT_CLINIC_OR_DEPARTMENT_OTHER): Payer: Medicare Other

## 2014-05-15 DIAGNOSIS — D0510 Intraductal carcinoma in situ of unspecified breast: Secondary | ICD-10-CM

## 2014-05-15 DIAGNOSIS — E039 Hypothyroidism, unspecified: Secondary | ICD-10-CM

## 2014-05-15 DIAGNOSIS — Z853 Personal history of malignant neoplasm of breast: Secondary | ICD-10-CM

## 2014-05-15 LAB — COMPREHENSIVE METABOLIC PANEL (CC13)
ALT: 27 U/L (ref 0–55)
AST: 31 U/L (ref 5–34)
Albumin: 3.6 g/dL (ref 3.5–5.0)
Alkaline Phosphatase: 129 U/L (ref 40–150)
Anion Gap: 9 mEq/L (ref 3–11)
BUN: 19.1 mg/dL (ref 7.0–26.0)
CO2: 25 mEq/L (ref 22–29)
Calcium: 9.2 mg/dL (ref 8.4–10.4)
Chloride: 108 mEq/L (ref 98–109)
Creatinine: 0.8 mg/dL (ref 0.6–1.1)
Glucose: 101 mg/dl (ref 70–140)
Potassium: 4.3 mEq/L (ref 3.5–5.1)
Sodium: 143 mEq/L (ref 136–145)
Total Bilirubin: 0.66 mg/dL (ref 0.20–1.20)
Total Protein: 6.6 g/dL (ref 6.4–8.3)

## 2014-05-15 LAB — CBC WITH DIFFERENTIAL/PLATELET
BASO%: 0.5 % (ref 0.0–2.0)
Basophils Absolute: 0 10*3/uL (ref 0.0–0.1)
EOS%: 2.6 % (ref 0.0–7.0)
Eosinophils Absolute: 0.1 10*3/uL (ref 0.0–0.5)
HCT: 44.9 % (ref 34.8–46.6)
HGB: 14.6 g/dL (ref 11.6–15.9)
LYMPH%: 36.3 % (ref 14.0–49.7)
MCH: 31.1 pg (ref 25.1–34.0)
MCHC: 32.5 g/dL (ref 31.5–36.0)
MCV: 95.6 fL (ref 79.5–101.0)
MONO#: 0.5 10*3/uL (ref 0.1–0.9)
MONO%: 12.4 % (ref 0.0–14.0)
NEUT#: 2 10*3/uL (ref 1.5–6.5)
NEUT%: 48.2 % (ref 38.4–76.8)
Platelets: 218 10*3/uL (ref 145–400)
RBC: 4.7 10*6/uL (ref 3.70–5.45)
RDW: 12.7 % (ref 11.2–14.5)
WBC: 4.1 10*3/uL (ref 3.9–10.3)
lymph#: 1.5 10*3/uL (ref 0.9–3.3)

## 2014-05-15 LAB — LIPID PANEL
Cholesterol: 141 mg/dL (ref 0–200)
HDL: 46 mg/dL (ref >39)
LDL Cholesterol: 76 mg/dL (ref 0–99)
Total CHOL/HDL Ratio: 3.1 Ratio
Triglycerides: 96 mg/dL (ref <150)
VLDL: 19 mg/dL (ref 0–40)

## 2014-05-15 LAB — TSH CHCC: TSH: 1.171 m(IU)/L (ref 0.308–3.960)

## 2014-05-22 ENCOUNTER — Encounter: Payer: Self-pay | Admitting: Hematology and Oncology

## 2014-05-22 ENCOUNTER — Telehealth: Payer: Self-pay | Admitting: Hematology and Oncology

## 2014-05-22 ENCOUNTER — Ambulatory Visit (HOSPITAL_BASED_OUTPATIENT_CLINIC_OR_DEPARTMENT_OTHER): Payer: Medicare Other | Admitting: Hematology and Oncology

## 2014-05-22 VITALS — BP 147/68 | HR 74 | Temp 98.4°F | Resp 18 | Ht 67.0 in | Wt 157.2 lb

## 2014-05-22 DIAGNOSIS — M949 Disorder of cartilage, unspecified: Secondary | ICD-10-CM

## 2014-05-22 DIAGNOSIS — D0591 Unspecified type of carcinoma in situ of right breast: Secondary | ICD-10-CM

## 2014-05-22 DIAGNOSIS — D0590 Unspecified type of carcinoma in situ of unspecified breast: Secondary | ICD-10-CM

## 2014-05-22 DIAGNOSIS — M899 Disorder of bone, unspecified: Secondary | ICD-10-CM

## 2014-05-22 MED ORDER — EXEMESTANE 25 MG PO TABS: 25.0000 mg | ORAL_TABLET | Freq: Every day | ORAL | Status: DC

## 2014-05-22 MED ORDER — VENLAFAXINE HCL ER 75 MG PO CP24: 75.0000 mg | ORAL_CAPSULE | Freq: Every day | ORAL | Status: DC

## 2014-05-22 NOTE — Telephone Encounter (Signed)
, °

## 2014-05-22 NOTE — Addendum Note (Signed)
Addended by: Prentiss Bells on: 05/22/2014 05:27 PM   Modules accepted: Orders

## 2014-05-22 NOTE — Assessment & Plan Note (Signed)
Right breast high-grade DCIS ER 100% PR 93%: Currently on antiestrogen therapy with Aromasin tolerating it very well along with Effexor hot flashes. We reviewed both Aromasin and Effexor. She will have completed 5 years of therapy by April 2016. Her bone density test done in March 2015 did not show osteoporosis it showed osteopenia with a T score of -1.1. She is currently on calcium and vitamin D supplementation. She denies any new lumps or nodules. Today's breast exam was normal I reviewed the mammogram from 2015 January and it was also normal.  Survivorship:Discussed the importance of physical exercise in decreasing the likelihood of breast cancer recurrence. Recommended 30 mins daily 6 days a week of either brisk walking or cycling or swimming. Encouraged patient to eat more fruits and vegetables and decrease red meat.   Surveillance: Patient will need a mammogram in January 2016. I will see her back in 8 months of middle of next year for followup after completing antiestrogen therapy.

## 2014-05-22 NOTE — Progress Notes (Signed)
Patient Care Team: Leonides Sake, MD as PCP - General (Family Medicine)  DIAGNOSIS: No matching staging information was found for the patient.  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 She is tolerating it very for any major problems. She would have completed 5 years of therapy by end of April 2016 according to the patient. She does not have any hot flashes because she takes Effexor. She request a refill of both Effexor and Aromasin.  REVIEW OF SYSTEMS:   Constitutional: Denies fevers, chills or abnormal weight loss Eyes: Denies blurriness of vision Ears, nose, mouth, throat, and face: Denies mucositis or sore throat Respiratory: Denies cough, dyspnea or wheezes Cardiovascular: Denies palpitation, chest discomfort or lower extremity swelling Gastrointestinal:  Denies nausea, heartburn or change in bowel habits Skin: Denies abnormal skin rashes Lymphatics: Denies new lymphadenopathy or easy bruising Neurological:Denies numbness, tingling or new weaknesses Behavioral/Psych: Mood is stable, no new changes  Breast:  denies any pain or lumps or nodules in either breasts All other systems were reviewed with the patient and are negative.  I have reviewed the past medical history, past surgical history, social history and family history with the patient and they  are unchanged from previous note.  ALLERGIES:  is allergic to neosporin.  MEDICATIONS:  Current Outpatient Prescriptions  Medication Sig Dispense Refill  . esomeprazole (NEXIUM) 20 MG capsule Take 20 mg by mouth daily.       Marland Kitchen exemestane (AROMASIN) 25 MG tablet Take 1 tablet (25 mg total) by mouth at bedtime.  30 tablet  7  . ezetimibe-simvastatin (VYTORIN) 10-10 MG per tablet Take 1 tablet by mouth at bedtime.      . Fluticasone-Salmeterol (ADVAIR) 250-50 MCG/DOSE AEPB Inhale 1 puff into the lungs daily.      Marland Kitchen levothyroxine (SYNTHROID, LEVOTHROID) 100 MCG tablet Take 100 mcg by mouth daily.      Marland Kitchen losartan (COZAAR) 50 MG tablet Take 50 mg by mouth at bedtime.      . montelukast (SINGULAIR) 10 MG tablet Take 10 mg by mouth at bedtime.       Marland Kitchen venlafaxine XR (EFFEXOR-XR) 75 MG 24 hr capsule Take 1 capsule (75 mg total) by mouth at bedtime.  30 capsule  7  . albuterol (PROVENTIL HFA;VENTOLIN HFA) 108 (90 BASE) MCG/ACT inhaler Inhale 1 puff into the lungs every 6 (six) hours as needed for wheezing or shortness of breath.       No current facility-administered medications for this visit.    PHYSICAL EXAMINATION: ECOG PERFORMANCE STATUS: 0 - Asymptomatic  Filed Vitals:   05/22/14 0930  BP: 147/68  Pulse: 74  Temp: 98.4 F (36.9 C)  Resp: 18   Filed Weights   05/22/14 0930  Weight: 157 lb 3 oz (71.3 kg)    GENERAL:alert, no distress and comfortable SKIN: skin color, texture, turgor are normal, no rashes or  significant lesions EYES: normal, Conjunctiva are pink and non-injected, sclera clear OROPHARYNX:no exudate, no erythema and lips, buccal mucosa, and tongue normal  NECK: supple, thyroid normal size, non-tender, without nodularity LYMPH:  no palpable lymphadenopathy in the cervical, axillary or inguinal LUNGS: clear to auscultation and percussion with normal breathing effort HEART: regular rate & rhythm and no murmurs and no lower extremity edema ABDOMEN:abdomen soft,  non-tender and normal bowel sounds Musculoskeletal:no cyanosis of digits and no clubbing  NEURO: alert & oriented x 3 with fluent speech, no focal motor/sensory deficits BREAST: No palpable masses or nodules in either right or left breasts. No palpable axillary supraclavicular or infraclavicular adenopathy no breast tenderness or nipple discharge.   LABORATORY DATA:  I have reviewed the data as listed   Chemistry      Component Value Date/Time   NA 143 05/15/2014 0908   NA 140 03/23/2011 0859   NA 141 05/27/2010 1030   K 4.3 05/15/2014 0908   K 4.3 03/23/2011 0859   K 3.9 05/27/2010 1030   CL 106 10/31/2012 0901   CL 104 03/23/2011 0859   CL 101 05/27/2010 1030   CO2 25 05/15/2014 0908   CO2 26 03/23/2011 0859   CO2 29 05/27/2010 1030   BUN 19.1 05/15/2014 0908   BUN 22 03/23/2011 0859   BUN 23* 05/27/2010 1030   CREATININE 0.8 05/15/2014 0908   CREATININE 0.78 03/23/2011 0859   CREATININE 0.8 05/27/2010 1030      Component Value Date/Time   CALCIUM 9.2 05/15/2014 0908   CALCIUM 9.4 03/23/2011 0859   CALCIUM 9.5 05/27/2010 1030   ALKPHOS 129 05/15/2014 0908   ALKPHOS 94 03/23/2011 0859   ALKPHOS 125* 05/27/2010 1030   AST 31 05/15/2014 0908   AST 29 03/23/2011 0859   AST 31 05/27/2010 1030   ALT 27 05/15/2014 0908   ALT 23 03/23/2011 0859   ALT 24 05/27/2010 1030   BILITOT 0.66 05/15/2014 0908   BILITOT 0.7 03/23/2011 0859   BILITOT 0.90 05/27/2010 1030       Lab Results  Component Value Date   WBC 4.1 05/15/2014   HGB 14.6 05/15/2014   HCT 44.9 05/15/2014   MCV 95.6 05/15/2014   PLT 218 05/15/2014   NEUTROABS 2.0 05/15/2014     RADIOGRAPHIC STUDIES: I have personally reviewed the radiology reports and agreed with their findings. No results found.   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 We reviewed both Aromasin and Effexor. She  will have completed 5 years of therapy by April 2016. Her bone density test done in March 2015 did not show osteoporosis it showed osteopenia with a T score of -1.1. She is currently on calcium and vitamin D supplementation. She denies any new lumps or nodules. Today's breast exam was normal I reviewed the mammogram from 2015 January and it was also normal.  Survivorship:Discussed the importance of physical exercise in decreasing the likelihood of breast cancer recurrence. Recommended 30 mins daily 6 days a week of either brisk walking or cycling or swimming. Encouraged patient to eat more fruits and vegetables and decrease red meat.   Surveillance: Patient will need a mammogram in January 2016. I will see her back in 8 months of middle of next year for followup after completing antiestrogen therapy.     No orders of the defined  types were placed in this encounter.   The patient has a good understanding of the overall plan. she agrees with it. She will call with any problems that may develop before her next visit here.  I spent 20 minutes counseling the patient face to face. The total time spent in the appointment was 25 minutes and more than 50% was on counseling and review of test results    Rulon Eisenmenger, MD 05/22/2014 9:57 AM

## 2014-06-19 ENCOUNTER — Other Ambulatory Visit: Payer: Self-pay

## 2014-08-18 ENCOUNTER — Other Ambulatory Visit: Payer: Self-pay | Admitting: Hematology and Oncology

## 2014-08-18 DIAGNOSIS — Z853 Personal history of malignant neoplasm of breast: Secondary | ICD-10-CM

## 2014-09-11 ENCOUNTER — Ambulatory Visit
Admission: RE | Admit: 2014-09-11 | Discharge: 2014-09-11 | Disposition: A | Discharge status: Home / Self Care | Payer: Medicare Other | Source: Ambulatory Visit | Attending: Hematology and Oncology | Admitting: Hematology and Oncology

## 2014-09-11 DIAGNOSIS — Z853 Personal history of malignant neoplasm of breast: Secondary | ICD-10-CM

## 2014-11-17 DIAGNOSIS — E780 Pure hypercholesterolemia: Secondary | ICD-10-CM | POA: Diagnosis not present

## 2014-11-17 DIAGNOSIS — Z79899 Other long term (current) drug therapy: Secondary | ICD-10-CM | POA: Diagnosis not present

## 2014-11-19 DIAGNOSIS — Z6824 Body mass index (BMI) 24.0-24.9, adult: Secondary | ICD-10-CM | POA: Diagnosis not present

## 2014-11-19 DIAGNOSIS — E780 Pure hypercholesterolemia: Secondary | ICD-10-CM | POA: Diagnosis not present

## 2014-11-19 DIAGNOSIS — E039 Hypothyroidism, unspecified: Secondary | ICD-10-CM | POA: Diagnosis not present

## 2014-11-19 DIAGNOSIS — F419 Anxiety disorder, unspecified: Secondary | ICD-10-CM | POA: Diagnosis not present

## 2014-11-19 DIAGNOSIS — I10 Essential (primary) hypertension: Secondary | ICD-10-CM | POA: Diagnosis not present

## 2014-12-30 DIAGNOSIS — I872 Venous insufficiency (chronic) (peripheral): Secondary | ICD-10-CM | POA: Diagnosis not present

## 2014-12-30 DIAGNOSIS — C44310 Basal cell carcinoma of skin of unspecified parts of face: Secondary | ICD-10-CM | POA: Diagnosis not present

## 2014-12-30 DIAGNOSIS — L309 Dermatitis, unspecified: Secondary | ICD-10-CM | POA: Diagnosis not present

## 2015-01-01 DIAGNOSIS — D0511 Intraductal carcinoma in situ of right breast: Secondary | ICD-10-CM | POA: Diagnosis not present

## 2015-01-10 DIAGNOSIS — J209 Acute bronchitis, unspecified: Secondary | ICD-10-CM | POA: Diagnosis not present

## 2015-01-10 DIAGNOSIS — J019 Acute sinusitis, unspecified: Secondary | ICD-10-CM | POA: Diagnosis not present

## 2015-01-19 ENCOUNTER — Other Ambulatory Visit: Payer: Self-pay | Admitting: Hematology and Oncology

## 2015-01-19 NOTE — Telephone Encounter (Signed)
Last OV 05/2014.  Next OV 01/2015.  Chart Reviewed

## 2015-01-20 DIAGNOSIS — Z6824 Body mass index (BMI) 24.0-24.9, adult: Secondary | ICD-10-CM | POA: Diagnosis not present

## 2015-01-20 DIAGNOSIS — I10 Essential (primary) hypertension: Secondary | ICD-10-CM | POA: Diagnosis not present

## 2015-01-20 DIAGNOSIS — Z9181 History of falling: Secondary | ICD-10-CM | POA: Diagnosis not present

## 2015-01-20 DIAGNOSIS — Z1389 Encounter for screening for other disorder: Secondary | ICD-10-CM | POA: Diagnosis not present

## 2015-01-21 ENCOUNTER — Ambulatory Visit (HOSPITAL_BASED_OUTPATIENT_CLINIC_OR_DEPARTMENT_OTHER): Payer: Medicare Other | Admitting: Hematology and Oncology

## 2015-01-21 VITALS — BP 139/75 | HR 78 | Temp 98.5°F | Resp 18 | Ht 67.0 in | Wt 159.0 lb

## 2015-01-21 DIAGNOSIS — D0591 Unspecified type of carcinoma in situ of right breast: Secondary | ICD-10-CM

## 2015-01-21 DIAGNOSIS — Z853 Personal history of malignant neoplasm of breast: Secondary | ICD-10-CM

## 2015-01-21 NOTE — Progress Notes (Signed)
Patient Care Team: Leonides Sake, MD as PCP - General (Family Medicine)  DIAGNOSIS: No matching staging information was found for the patient.  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 She has completed 5 years of therapy and is ready to stop treatment. She feels well other than the joint aches and pains as well as hot flashes for which she takes Effexor. She would like to continue the Effexor because she was on Zoloft prior to all of this for anxiety/depression.  REVIEW OF SYSTEMS:   Constitutional: Denies fevers, chills or abnormal weight loss Eyes: Denies blurriness of vision Ears, nose, mouth, throat, and face: Denies mucositis or sore throat Respiratory: Denies cough, dyspnea or wheezes Cardiovascular: Denies palpitation, chest discomfort or lower extremity swelling Gastrointestinal:  Denies nausea, heartburn or change in bowel habits Skin: Denies abnormal skin rashes Lymphatics: Denies new lymphadenopathy or easy bruising Neurological:Denies numbness, tingling or new weaknesses Behavioral/Psych: Anxiety treated with Effexor Breast:  denies any pain or lumps or nodules in either breasts All other systems were reviewed with the patient and are negative.  I have reviewed the past medical history, past surgical history, social history and family history with the patient and they are unchanged from  previous note.  ALLERGIES:  is allergic to neosporin.  MEDICATIONS:  Current Outpatient Prescriptions  Medication Sig Dispense Refill  . albuterol (PROVENTIL HFA;VENTOLIN HFA) 108 (90 BASE) MCG/ACT inhaler Inhale 1 puff into the lungs every 6 (six) hours as needed for wheezing or shortness of breath.    . esomeprazole (NEXIUM) 20 MG capsule Take 20 mg by mouth daily.     Marland Kitchen exemestane (AROMASIN) 25 MG tablet TAKE 1 TABLET BY MOUTH AT BEDTIME 30 tablet 7  . ezetimibe-simvastatin (VYTORIN) 10-10 MG per tablet Take 1 tablet by mouth at bedtime.    . Fluticasone-Salmeterol (ADVAIR) 250-50 MCG/DOSE AEPB Inhale 1 puff into the lungs daily.    Marland Kitchen levothyroxine (SYNTHROID, LEVOTHROID) 100 MCG tablet Take 100 mcg by mouth daily.    Marland Kitchen losartan (COZAAR) 100 MG tablet 100 mg daily.     . montelukast (SINGULAIR) 10 MG tablet Take 10 mg by mouth at bedtime.     Marland Kitchen venlafaxine XR (EFFEXOR-XR) 75 MG 24 hr capsule TAKE ONE CAPSULE BY MOUTH AT BEDTIME 30 capsule 7   No current facility-administered medications for this visit.    PHYSICAL EXAMINATION: ECOG PERFORMANCE STATUS: 0 - Asymptomatic  Filed Vitals:   01/21/15 0934  BP: 139/75  Pulse: 78  Temp: 98.5 F (36.9 C)  Resp: 18   Filed Weights   01/21/15 0934  Weight: 159 lb (72.122 kg)    GENERAL:alert, no distress and comfortable SKIN: skin color, texture, turgor are normal, no rashes or significant lesions EYES: normal, Conjunctiva are pink and non-injected, sclera clear OROPHARYNX:no exudate, no erythema and lips, buccal mucosa, and tongue normal  NECK: supple, thyroid normal size, non-tender, without nodularity LYMPH:  no palpable lymphadenopathy in the cervical, axillary or inguinal LUNGS: clear to auscultation and percussion with normal breathing effort HEART: regular rate & rhythm and no murmurs and no lower extremity edema ABDOMEN:abdomen soft, non-tender and normal bowel sounds Musculoskeletal:no cyanosis of digits and no clubbing   NEURO: alert & oriented x 3 with fluent speech, no focal motor/sensory deficits BREAST: No palpable masses or nodules in either right or left breasts. No palpable axillary supraclavicular or infraclavicular adenopathy no breast tenderness or nipple discharge. (exam performed in the presence of a chaperone)  LABORATORY DATA:  I have reviewed the data as listed   Chemistry      Component Value Date/Time   NA 143 05/15/2014 0908   NA 140 03/23/2011 0859   NA 141 05/27/2010 1030   K 4.3 05/15/2014 0908   K 4.3 03/23/2011 0859   K 3.9 05/27/2010 1030   CL 106 10/31/2012 0901   CL 104 03/23/2011 0859   CL 101 05/27/2010 1030   CO2 25 05/15/2014 0908   CO2 26 03/23/2011 0859   CO2 29 05/27/2010 1030   BUN 19.1 05/15/2014 0908   BUN 22 03/23/2011 0859   BUN 23* 05/27/2010 1030   CREATININE 0.8 05/15/2014 0908   CREATININE 0.78 03/23/2011 0859   CREATININE 0.8 05/27/2010 1030      Component Value Date/Time   CALCIUM 9.2 05/15/2014 0908   CALCIUM 9.4 03/23/2011 0859   CALCIUM 9.5 05/27/2010 1030   ALKPHOS 129 05/15/2014 0908   ALKPHOS 94 03/23/2011 0859   ALKPHOS 125* 05/27/2010 1030   AST 31 05/15/2014 0908   AST 29 03/23/2011 0859   AST 31 05/27/2010 1030   ALT 27 05/15/2014 0908   ALT 23 03/23/2011 0859   ALT 24 05/27/2010 1030   BILITOT 0.66 05/15/2014 0908   BILITOT 0.7 03/23/2011 0859   BILITOT 0.90 05/27/2010 1030       Lab Results  Component Value Date   WBC 4.1 05/15/2014   HGB 14.6 05/15/2014   HCT 44.9 05/15/2014   MCV 95.6 05/15/2014   PLT 218 05/15/2014   NEUTROABS 2.0 05/15/2014     RADIOGRAPHIC STUDIES: I have personally reviewed the radiology reports and agreed with their findings. Mammogram January 2016 is normal  ASSESSMENT & PLAN:  Cancer of right breast, stage 0 Right breast high-grade DCIS diagnosed 09/13/2009 ER 100% PR 93%: Currently on antiestrogen therapy with Aromasin tolerating it very well along with Effexor hot flashes. We reviewed  both Aromasin and Effexor. She will have completed 5 years of therapy started 03/04/2010 and completed April 2016.  Breast Cancer Surveillance: 1. Breast exam 01/21/2015: Normal 2. Mammogram 09/11/2014 No abnormalities. Postsurgical changes. Breast Density Category B. I recommended that she get 3-D mammograms for surveillance. Discussed the differences between different breast density categories.   Return to clinic in 1 year for follow-up     No orders of the defined types were placed in this encounter.   The patient has a good understanding of the overall plan. she agrees with it. she will call with any problems that may develop before the next visit here.   Rulon Eisenmenger, MD

## 2015-01-21 NOTE — Assessment & Plan Note (Addendum)
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 We reviewed both Aromasin and Effexor. She will have completed 5 years of therapy started 03/04/2010 and completed April 2016.  Breast Cancer Surveillance: 1. Breast exam 01/21/2015: Normal 2. Mammogram 09/11/2014 No abnormalities. Postsurgical changes. Breast Density Category B. I recommended that she get 3-D mammograms for surveillance. Discussed the differences between different breast density categories.   Return to clinic in 1 year for follow-up

## 2015-01-26 ENCOUNTER — Telehealth: Payer: Self-pay | Admitting: Hematology and Oncology

## 2015-01-26 DIAGNOSIS — Z6824 Body mass index (BMI) 24.0-24.9, adult: Secondary | ICD-10-CM | POA: Diagnosis not present

## 2015-01-26 DIAGNOSIS — Z1389 Encounter for screening for other disorder: Secondary | ICD-10-CM | POA: Diagnosis not present

## 2015-01-26 DIAGNOSIS — R05 Cough: Secondary | ICD-10-CM | POA: Diagnosis not present

## 2015-01-26 DIAGNOSIS — Z9181 History of falling: Secondary | ICD-10-CM | POA: Diagnosis not present

## 2015-01-26 DIAGNOSIS — J309 Allergic rhinitis, unspecified: Secondary | ICD-10-CM | POA: Diagnosis not present

## 2015-01-26 DIAGNOSIS — I10 Essential (primary) hypertension: Secondary | ICD-10-CM | POA: Diagnosis not present

## 2015-01-26 NOTE — Telephone Encounter (Signed)
s.w pt and advised on May 2017 appt....pt ok and aware °

## 2015-02-19 DIAGNOSIS — E780 Pure hypercholesterolemia: Secondary | ICD-10-CM | POA: Diagnosis not present

## 2015-02-19 DIAGNOSIS — Z79899 Other long term (current) drug therapy: Secondary | ICD-10-CM | POA: Diagnosis not present

## 2015-02-19 DIAGNOSIS — E039 Hypothyroidism, unspecified: Secondary | ICD-10-CM | POA: Diagnosis not present

## 2015-02-23 ENCOUNTER — Encounter: Payer: Self-pay | Admitting: Pulmonary Disease

## 2015-02-23 ENCOUNTER — Ambulatory Visit (INDEPENDENT_AMBULATORY_CARE_PROVIDER_SITE_OTHER): Payer: Medicare Other | Admitting: Pulmonary Disease

## 2015-02-23 ENCOUNTER — Institutional Professional Consult (permissible substitution): Payer: Medicare Other | Admitting: Pulmonary Disease

## 2015-02-23 VITALS — BP 118/70 | HR 71 | Ht 67.0 in | Wt 155.0 lb

## 2015-02-23 DIAGNOSIS — R05 Cough: Secondary | ICD-10-CM

## 2015-02-23 DIAGNOSIS — I10 Essential (primary) hypertension: Secondary | ICD-10-CM | POA: Diagnosis not present

## 2015-02-23 DIAGNOSIS — E039 Hypothyroidism, unspecified: Secondary | ICD-10-CM | POA: Diagnosis not present

## 2015-02-23 DIAGNOSIS — J45991 Cough variant asthma: Secondary | ICD-10-CM | POA: Diagnosis not present

## 2015-02-23 DIAGNOSIS — Z6823 Body mass index (BMI) 23.0-23.9, adult: Secondary | ICD-10-CM | POA: Diagnosis not present

## 2015-02-23 DIAGNOSIS — E780 Pure hypercholesterolemia: Secondary | ICD-10-CM | POA: Diagnosis not present

## 2015-02-23 DIAGNOSIS — R059 Cough, unspecified: Secondary | ICD-10-CM

## 2015-02-23 MED ORDER — BENZONATATE 200 MG PO CAPS: 200.0000 mg | ORAL_CAPSULE | Freq: Three times a day (TID) | ORAL | Status: DC | PRN

## 2015-02-23 NOTE — Progress Notes (Signed)
Subjective:    Patient ID: Lindsey Price, female    DOB: [birth year], 69 y.o.   MRN: [id number]  HPI Chief Complaint  Patient presents with  . Advice Only    Referred for lung nodules, asthma by Dr. Lisbeth Ply.  Pt c/o sob.  This started after undergoing radiation therapy for breast cancer.     This is a very pleasant 69 year old female who previously smoked as a teenager through mid adulthood who comes to my clinic today for evaluation of persistent cough. She has a history of pulmonary nodules which were followed by my colleague at [facility] Dr. Kerin Ransom for several years. Her sister had a history of lung cancer. She stated that prior to the diagnosis of her pulmonary nodules she never had any respiratory complaints or a respiratory diagnosis. Her pulmonary nodules were discovered when she complained of chest pain and had a CT angiogram of her chest revealing the nodules. The nodules never grew and she never needed a biopsy. However, after that she was diagnosed with breast cancer and treated with radiation therapy to the right side. Ever since then she has had persistent cough. She says that the cough is typically worse when she has an upper respiratory infection. She will be seen by physician during these episodes and she will be told that she is wheezing and likely has asthma. She says that the cough will linger for several weeks to months at a time and is always improved with albuterol use. It is not associated with shortness of breath. She says that the coughing spells will come on and will last for several minutes on and he. She also will have a cough associated with exercise but no wheezing or chest tightness or dyspnea. She says that her most recent cough came on after Mother's Day when she had an episode of bronchitis and it has persisted since then. It has not improved. She has been taking albuterol which helps. It has been quite bothersome for her.     Past Medical History  Diagnosis Date  .  Hypertension   . Hyperlipidemia   . 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 Insomnia 09/11/2011  . Arthralgia 09/11/2011  . Thyroid disease     hypothyroidism  . Breast cancer   . Asthma      Family History  Problem Relation Age of Onset  . Cancer Mother     BREAST  . Cancer Father     pancreatic  . Cancer Sister     sebacous cell carcinoma  . Cancer Maternal Aunt     lung  . Cancer Paternal Uncle     colon, stomach     History   Social History  . Marital Status: Married    Spouse Name: N/A  . Number of Children: N/A  . Years of Education: N/A   Occupational History  . Not on file.   Social History Main Topics  . Smoking status: Former Smoker -- 1.00 packs/day for 15 years    Types: Cigarettes    Quit date: 12/09/1978  . Smokeless tobacco: Former Systems developer    Quit date: 09/04/1978  . Alcohol Use: 0.0 oz/week    0 Standard drinks or equivalent per week     Comment: SOCIALLY  . Drug Use: No  . Sexual Activity: Yes   Other Topics Concern  . Not on file   Social History Narrative     Allergies  Allergen Reactions  . Advair Diskus [  Fluticasone-Salmeterol]     Hoarseness.  . Neosporin [Neomycin-Polymyxin-Gramicidin] Itching     Outpatient Prescriptions Prior to Visit  Medication Sig Dispense Refill  . albuterol (PROVENTIL HFA;VENTOLIN HFA) 108 (90 BASE) MCG/ACT inhaler Inhale 1 puff into the lungs every 6 (six) hours as needed for wheezing or shortness of breath.    . esomeprazole (NEXIUM) 20 MG capsule Take 20 mg by mouth daily.     Marland Kitchen ezetimibe-simvastatin (VYTORIN) 10-10 MG per tablet Take 1 tablet by mouth at bedtime.    Marland Kitchen levothyroxine (SYNTHROID, LEVOTHROID) 100 MCG tablet Take 100 mcg by mouth daily.    . montelukast (SINGULAIR) 10 MG tablet Take 10 mg by mouth at bedtime.     Marland Kitchen venlafaxine XR (EFFEXOR-XR) 75 MG 24 hr capsule TAKE ONE CAPSULE BY MOUTH AT BEDTIME 30 capsule 7  . exemestane (AROMASIN) 25 MG tablet TAKE 1 TABLET BY MOUTH AT  BEDTIME (Patient not taking: Reported on 02/23/2015) 30 tablet 7  . Fluticasone-Salmeterol (ADVAIR) 250-50 MCG/DOSE AEPB Inhale 1 puff into the lungs daily.    Marland Kitchen losartan (COZAAR) 100 MG tablet 100 mg daily.      No facility-administered medications prior to visit.       Review of Systems  Constitutional: Negative for fever and unexpected weight change.  HENT: Negative for congestion, dental problem, ear pain, nosebleeds, postnasal drip, rhinorrhea, sinus pressure, sneezing, sore throat and trouble swallowing.   Eyes: Negative for redness and itching.  Respiratory: Positive for cough. Negative for chest tightness, shortness of breath and wheezing.   Cardiovascular: Negative for palpitations and leg swelling.  Gastrointestinal: Negative for nausea and vomiting.  Genitourinary: Negative for dysuria.  Musculoskeletal: Negative for joint swelling.  Skin: Negative for rash.  Neurological: Negative for headaches.  Hematological: Does not bruise/bleed easily.  Psychiatric/Behavioral: Negative for dysphoric mood. The patient is not nervous/anxious.        Objective:   Physical Exam Filed Vitals:   02/23/15 1120  BP: 118/70  Pulse: 71  Height: 5\' 7"  (1.702 m)  Weight: 155 lb (70.308 kg)  SpO2: 95%  RA  Ambulated 500 feet on room air and her O2 saturation was normal  Gen: well appearing, no acute distress HENT: NCAT, OP clear, neck supple without masses Eyes: PERRL, EOMi Lymph: no cervical lymphadenopathy PULM: CTA B CV: RRR, no mgr, no JVD GI: BS+, soft, nontender, no hsm Derm: no rash or skin breakdown MSK: normal bulk and tone Neuro: A&Ox4, CN II-XII intact, strength 5/5 in all 4 extremities Psyche: normal mood and affect    Simple spirometry June 2016 without significant airflow obstruction Exhaled nitric oxide 58 ppm 02/10/2015 Recent primary care physician notes reviewed where she was treated for allergic rhinitis     Assessment & Plan:  Cough variant asthma She  has several potential causes for cough but I think that cough variant asthma is very likely. She has a significant improvement with albuterol which is a bit unusual but often seen in patients with cough variant asthma. Further, she has been told that she often wheezes for several weeks after having an upper respiratory infection. She also had a high exhaled nitric oxide value today which coincides with eosinophilic inflammation of the airways. This can be seen with eosinophilic bronchitis or asthma and is not specific for either. However, I believe that it goes along with a diagnosis of cough and asthma.  It is also very likely that her cough is worsened by postnasal drip from allergic rhinitis as well  as ongoing laryngeal irritation or cyclical cough.  Plan: Obtain the results of the chest x-ray from urgent care Inhaled cortical steroid (start Qvar through a spacer twice a day, spacer provided) Continue to use albuterol on an as-needed basis for cough Voice rest was encouraged Tessalon Perles Follow-up in one month or sooner if needed    Current outpatient prescriptions:  .  albuterol (PROVENTIL HFA;VENTOLIN HFA) 108 (90 BASE) MCG/ACT inhaler, Inhale 1 puff into the lungs every 6 (six) hours as needed for wheezing or shortness of breath., Disp: , Rfl:  .  esomeprazole (NEXIUM) 20 MG capsule, Take 20 mg by mouth daily. , Disp: , Rfl:  .  ezetimibe-simvastatin (VYTORIN) 10-10 MG per tablet, Take 1 tablet by mouth at bedtime., Disp: , Rfl:  .  levothyroxine (SYNTHROID, LEVOTHROID) 100 MCG tablet, Take 100 mcg by mouth daily., Disp: , Rfl:  .  losartan-hydrochlorothiazide (HYZAAR) 100-12.5 MG per tablet, Take 1 tablet by mouth daily., Disp: , Rfl:  .  montelukast (SINGULAIR) 10 MG tablet, Take 10 mg by mouth at bedtime. , Disp: , Rfl:  .  venlafaxine XR (EFFEXOR-XR) 75 MG 24 hr capsule, TAKE ONE CAPSULE BY MOUTH AT BEDTIME, Disp: 30 capsule, Rfl: 7 .  benzonatate (TESSALON) 200 MG capsule, Take 1  capsule (200 mg total) by mouth 3 (three) times daily as needed for cough., Disp: 30 capsule, Rfl: 1

## 2015-02-23 NOTE — Assessment & Plan Note (Signed)
She has several potential causes for cough but I think that cough variant asthma is very likely. She has a significant improvement with albuterol which is a bit unusual but often seen in patients with cough variant asthma. Further, she has been told that she often wheezes for several weeks after having an upper respiratory infection. She also had a high exhaled nitric oxide value today which coincides with eosinophilic inflammation of the airways. This can be seen with eosinophilic bronchitis or asthma and is not specific for either. However, I believe that it goes along with a diagnosis of cough and asthma.  It is also very likely that her cough is worsened by postnasal drip from allergic rhinitis as well as ongoing laryngeal irritation or cyclical cough.  Plan: Obtain the results of the chest x-ray from urgent care Inhaled cortical steroid (start Qvar through a spacer twice a day, spacer provided) Continue to use albuterol on an as-needed basis for cough Voice rest was encouraged Tessalon Perles Follow-up in one month or sooner if needed

## 2015-02-23 NOTE — Patient Instructions (Signed)
Take Qvar 2 puffs twice a day with a spacer no matter how you feel We will obtain the results of the chest x-ray  Keep taking your antacid therapy and the allergy treatment prescribed by your primary care physician  I recommend that you take over the counter generic zyrtec (cetirizine) daily  You need to try to suppress your cough to allow your larynx (voice box) to heal.  For three days don't talk, laugh, sing, or clear your throat. Do everything you can to suppress the cough during this time. Use hard candies (sugarless Jolly Ranchers) or non-mint or non-menthol containing cough drops during this time to soothe your throat.  Use a cough suppressant (tessalon I prescribed) around the clock during this time.  After three days, gradually increase the use of your voice and back off on the cough suppressants.   We will see back in 4 weeks exertion or needed

## 2015-03-01 ENCOUNTER — Other Ambulatory Visit: Payer: Self-pay

## 2015-03-23 ENCOUNTER — Ambulatory Visit (INDEPENDENT_AMBULATORY_CARE_PROVIDER_SITE_OTHER): Payer: Medicare Other | Admitting: Pulmonary Disease

## 2015-03-23 ENCOUNTER — Encounter: Payer: Self-pay | Admitting: Pulmonary Disease

## 2015-03-23 VITALS — BP 108/66 | HR 78 | Ht 67.0 in | Wt 158.0 lb

## 2015-03-23 DIAGNOSIS — J45991 Cough variant asthma: Secondary | ICD-10-CM | POA: Diagnosis not present

## 2015-03-23 DIAGNOSIS — J302 Other seasonal allergic rhinitis: Secondary | ICD-10-CM | POA: Diagnosis not present

## 2015-03-23 DIAGNOSIS — J309 Allergic rhinitis, unspecified: Secondary | ICD-10-CM | POA: Insufficient documentation

## 2015-03-23 NOTE — Progress Notes (Signed)
   Subjective:    Patient ID: Lindsey Price, female    DOB: 05/10/1946, 69 y.o.   MRN: [id number]  Synopsis: Cough variant asthma and allergic rhinitis.  HPI Chief Complaint  Patient presents with  . Follow-up    pt states her cough is much improved but has noted hoarseness since starting Qvar.     Her cough is 90-100% better but she is now hoarse.  She still has the post nasal drip.  She has been using the QVar regularly 2 puffs bid.  Still taking all the sinus medications.   Past Medical History  Diagnosis Date  . Hypertension   . Hyperlipidemia   . 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 Insomnia 09/11/2011  . Arthralgia 09/11/2011  . Thyroid disease     hypothyroidism  . Breast cancer   . Asthma       Review of Systems     Objective:   Physical Exam Filed Vitals:   03/23/15 1339  BP: 108/66  Pulse: 78  Height: 5\' 7"  (1.702 m)  Weight: 158 lb (71.668 kg)  SpO2: 96%   RA  Gen: well appearing HENT: OP clear, TM's clear, neck supple PULM: CTA B, normal percussion CV: RRR, no mgr, trace edema GI: BS+, soft, nontender Derm: no cyanosis or rash Psyche: normal mood and affect        Assessment & Plan:  Cough variant asthma Because of some intermittent wheezing, and a high exhaled nitric oxide, and a strong allergic history I feel that she has cough variant asthma. I'm encouraged by the fact that the inhaled cortical steroid has nearly cured her cough.  Plan: Continue Qvar, will have her decrease the dose to half (1 puff twice a day) because of hoarseness. Follow-up 6 months or sooner if needed, advised to get a flu shot in the fall  Allergic rhinitis She has persistent allergic rhinitis despite maximal medical therapy with a nasal steroid, Singulair, and an oral anti-histamine.  Plan: Allergy referral     Current outpatient prescriptions:  .  albuterol (PROVENTIL HFA;VENTOLIN HFA) 108 (90 BASE) MCG/ACT inhaler, Inhale 1 puff into the  lungs every 6 (six) hours as needed for wheezing or shortness of breath., Disp: , Rfl:  .  benzonatate (TESSALON) 200 MG capsule, Take 1 capsule (200 mg total) by mouth 3 (three) times daily as needed for cough., Disp: 30 capsule, Rfl: 1 .  cetirizine (ZYRTEC) 10 MG tablet, Take 10 mg by mouth daily., Disp: , Rfl:  .  esomeprazole (NEXIUM) 20 MG capsule, Take 20 mg by mouth daily. , Disp: , Rfl:  .  ezetimibe-simvastatin (VYTORIN) 10-10 MG per tablet, Take 1 tablet by mouth at bedtime., Disp: , Rfl:  .  fluticasone (FLONASE) 50 MCG/ACT nasal spray, Place 1 spray into both nostrils daily., Disp: , Rfl:  .  levothyroxine (SYNTHROID, LEVOTHROID) 100 MCG tablet, Take 100 mcg by mouth daily., Disp: , Rfl:  .  losartan-hydrochlorothiazide (HYZAAR) 100-12.5 MG per tablet, Take 1 tablet by mouth daily., Disp: , Rfl:  .  montelukast (SINGULAIR) 10 MG tablet, Take 10 mg by mouth at bedtime. , Disp: , Rfl:  .  venlafaxine XR (EFFEXOR-XR) 75 MG 24 hr capsule, TAKE ONE CAPSULE BY MOUTH AT BEDTIME, Disp: 30 capsule, Rfl: 7

## 2015-03-23 NOTE — Assessment & Plan Note (Signed)
Because of some intermittent wheezing, and a high exhaled nitric oxide, and a strong allergic history I feel that Lindsey Price has cough variant asthma. I'm encouraged by the fact that the inhaled cortical steroid has nearly cured her cough.  Plan: Continue Qvar, will have her decrease the dose to half (1 puff twice a day) because of hoarseness. Follow-up 6 months or sooner if needed, advised to get a flu shot in the fall

## 2015-03-23 NOTE — Assessment & Plan Note (Signed)
She has persistent allergic rhinitis despite maximal medical therapy with a nasal steroid, Singulair, and an oral anti-histamine.  Plan: Allergy referral

## 2015-03-23 NOTE — Patient Instructions (Signed)
Decrease Qvar to 1 puff twice a day If you still have hoarseness, then you can decrease the dose to 1 puff daily Be sure to rinse her mouth thoroughly after using the inhaled steroid We will refer you to an allergist We will see you back in 6 months or sooner if needed

## 2015-03-23 NOTE — Addendum Note (Signed)
Addended by: Len Blalock on: 03/23/2015 02:10 PM   Modules accepted: Orders

## 2015-03-25 DIAGNOSIS — E039 Hypothyroidism, unspecified: Secondary | ICD-10-CM | POA: Diagnosis not present

## 2015-04-07 DIAGNOSIS — K219 Gastro-esophageal reflux disease without esophagitis: Secondary | ICD-10-CM | POA: Diagnosis not present

## 2015-04-07 DIAGNOSIS — R490 Dysphonia: Secondary | ICD-10-CM | POA: Diagnosis not present

## 2015-04-14 ENCOUNTER — Other Ambulatory Visit: Payer: Self-pay

## 2015-04-14 MED ORDER — VENLAFAXINE HCL ER 75 MG PO CP24: 75.0000 mg | ORAL_CAPSULE | Freq: Every day | ORAL | Status: DC

## 2015-04-21 DIAGNOSIS — D3131 Benign neoplasm of right choroid: Secondary | ICD-10-CM | POA: Diagnosis not present

## 2015-04-21 DIAGNOSIS — H2513 Age-related nuclear cataract, bilateral: Secondary | ICD-10-CM | POA: Diagnosis not present

## 2015-04-27 ENCOUNTER — Telehealth: Payer: Self-pay | Admitting: Pulmonary Disease

## 2015-04-27 MED ORDER — BECLOMETHASONE DIPROPIONATE 80 MCG/ACT IN AERS: 1.0000 | INHALATION_SPRAY | Freq: Two times a day (BID) | RESPIRATORY_TRACT | Status: DC

## 2015-04-27 NOTE — Telephone Encounter (Signed)
Informed pt medication was being sent to pharmacy. Nothing further needed.

## 2015-04-28 ENCOUNTER — Telehealth: Payer: Self-pay | Admitting: Pulmonary Disease

## 2015-04-28 MED ORDER — FLUTICASONE PROPIONATE (INHAL) 250 MCG/BLIST IN AEPB: 1.0000 | INHALATION_SPRAY | Freq: Two times a day (BID) | RESPIRATORY_TRACT | Status: DC

## 2015-04-28 NOTE — Telephone Encounter (Signed)
Called and spoke to pt. Informed her of the change in inhalers. Rx sent to preferred pharmacy. Pt verbalized understanding and denied any further questions or concerns at this time.   

## 2015-04-28 NOTE — Telephone Encounter (Signed)
LMTC x 1 for pt.  Need to advise pt of Dr Anastasia Pall recommendations to change Qvar to [facility]

## 2015-04-28 NOTE — Telephone Encounter (Signed)
Flovent 250 one puff twice a day

## 2015-04-28 NOTE — Telephone Encounter (Signed)
Received call from [place] is not covered under patient's insurance Can substitute with:  Flovent, Spiriva or Servent  Dr. Lake Bells - please advise.  Allergies  Allergen Reactions  . Advair Diskus [Fluticasone-Salmeterol]     Hoarseness.  . Neosporin [Neomycin-Polymyxin-Gramicidin] Itching

## 2015-04-28 NOTE — Telephone Encounter (Signed)
Pt returning call.Coll A Dalton ° °

## 2015-06-25 DIAGNOSIS — Z23 Encounter for immunization: Secondary | ICD-10-CM | POA: Diagnosis not present

## 2015-07-12 ENCOUNTER — Institutional Professional Consult (permissible substitution): Payer: Medicare Other | Admitting: Internal Medicine

## 2015-07-23 DIAGNOSIS — E7800 Pure hypercholesterolemia, unspecified: Secondary | ICD-10-CM | POA: Diagnosis not present

## 2015-07-23 DIAGNOSIS — E039 Hypothyroidism, unspecified: Secondary | ICD-10-CM | POA: Diagnosis not present

## 2015-07-23 DIAGNOSIS — Z79899 Other long term (current) drug therapy: Secondary | ICD-10-CM | POA: Diagnosis not present

## 2015-07-27 DIAGNOSIS — E039 Hypothyroidism, unspecified: Secondary | ICD-10-CM | POA: Diagnosis not present

## 2015-07-27 DIAGNOSIS — E7800 Pure hypercholesterolemia, unspecified: Secondary | ICD-10-CM | POA: Diagnosis not present

## 2015-07-27 DIAGNOSIS — J45909 Unspecified asthma, uncomplicated: Secondary | ICD-10-CM | POA: Diagnosis not present

## 2015-07-27 DIAGNOSIS — I10 Essential (primary) hypertension: Secondary | ICD-10-CM | POA: Diagnosis not present

## 2015-07-27 DIAGNOSIS — Z1389 Encounter for screening for other disorder: Secondary | ICD-10-CM | POA: Diagnosis not present

## 2015-08-20 ENCOUNTER — Other Ambulatory Visit: Payer: Self-pay | Admitting: Hematology and Oncology

## 2015-08-20 DIAGNOSIS — Z853 Personal history of malignant neoplasm of breast: Secondary | ICD-10-CM

## 2015-09-02 DIAGNOSIS — Z9181 History of falling: Secondary | ICD-10-CM | POA: Diagnosis not present

## 2015-09-02 DIAGNOSIS — R310 Gross hematuria: Secondary | ICD-10-CM | POA: Diagnosis not present

## 2015-09-02 DIAGNOSIS — Z6824 Body mass index (BMI) 24.0-24.9, adult: Secondary | ICD-10-CM | POA: Diagnosis not present

## 2015-09-02 DIAGNOSIS — R300 Dysuria: Secondary | ICD-10-CM | POA: Diagnosis not present

## 2015-09-14 ENCOUNTER — Ambulatory Visit
Admission: RE | Admit: 2015-09-14 | Discharge: 2015-09-14 | Disposition: A | Discharge status: Home / Self Care | Payer: PPO | Source: Ambulatory Visit | Attending: Hematology and Oncology | Admitting: Hematology and Oncology

## 2015-09-14 DIAGNOSIS — R928 Other abnormal and inconclusive findings on diagnostic imaging of breast: Secondary | ICD-10-CM | POA: Diagnosis not present

## 2015-09-14 DIAGNOSIS — Z853 Personal history of malignant neoplasm of breast: Secondary | ICD-10-CM

## 2015-12-08 DIAGNOSIS — D225 Melanocytic nevi of trunk: Secondary | ICD-10-CM | POA: Diagnosis not present

## 2015-12-08 DIAGNOSIS — L821 Other seborrheic keratosis: Secondary | ICD-10-CM | POA: Diagnosis not present

## 2015-12-08 DIAGNOSIS — I872 Venous insufficiency (chronic) (peripheral): Secondary | ICD-10-CM | POA: Diagnosis not present

## 2015-12-08 DIAGNOSIS — L723 Sebaceous cyst: Secondary | ICD-10-CM | POA: Diagnosis not present

## 2015-12-08 DIAGNOSIS — L309 Dermatitis, unspecified: Secondary | ICD-10-CM | POA: Diagnosis not present

## 2015-12-14 DIAGNOSIS — D0511 Intraductal carcinoma in situ of right breast: Secondary | ICD-10-CM | POA: Diagnosis not present

## 2016-01-19 DIAGNOSIS — E7800 Pure hypercholesterolemia, unspecified: Secondary | ICD-10-CM | POA: Diagnosis not present

## 2016-01-19 DIAGNOSIS — E039 Hypothyroidism, unspecified: Secondary | ICD-10-CM | POA: Diagnosis not present

## 2016-01-19 DIAGNOSIS — Z79899 Other long term (current) drug therapy: Secondary | ICD-10-CM | POA: Diagnosis not present

## 2016-01-20 ENCOUNTER — Ambulatory Visit (INDEPENDENT_AMBULATORY_CARE_PROVIDER_SITE_OTHER): Payer: PPO | Admitting: Pulmonary Disease

## 2016-01-20 ENCOUNTER — Encounter: Payer: Self-pay | Admitting: Pulmonary Disease

## 2016-01-20 VITALS — BP 102/74 | HR 93 | Ht 67.0 in | Wt 157.4 lb

## 2016-01-20 DIAGNOSIS — J302 Other seasonal allergic rhinitis: Secondary | ICD-10-CM | POA: Diagnosis not present

## 2016-01-20 DIAGNOSIS — J45991 Cough variant asthma: Secondary | ICD-10-CM | POA: Diagnosis not present

## 2016-01-20 NOTE — Patient Instructions (Signed)
I would like for you to call our office after you have reviewed your insurance medication formulary. Specifically, I want to know if your formulary covers Qvar (beclomethasone). Otherwise, you can discuss with [place] what other inhaled options are available to you. Keep taking the other allergic rhinitis medications as prescribed Follow-up with me in 6 months or sooner if needed

## 2016-01-20 NOTE — Assessment & Plan Note (Signed)
Continue current regimen

## 2016-01-20 NOTE — Progress Notes (Signed)
Subjective:    Patient ID: Lindsey Price, female    DOB: Jun 30, 1946, 70 y.o.   MRN: [id number]  Synopsis: Cough variant asthma and allergic rhinitis.  HPI Chief Complaint  Patient presents with  . Follow-up    yearly Asthma follow up - using the Flovent but it makes her hoarse with some dry cough   Lindsey Price survived the winter wihtout too much difficulty.  She had a bad cough prior to Easter and this has improved since restarting Flovent.  She had cold symptoms (sinus ocngestion, sore throat) this lead to the cough. She restarted Flovent which caused hoarseness. Flovent has clearly made her hoarse so she had initially held it because of the hoarseness.  She only has mild cough now with a deep breathing.  No trouble breathing.  Past Medical History  Diagnosis Date  . Hypertension   . Hyperlipidemia   . 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 Insomnia 09/11/2011  . Arthralgia 09/11/2011  . Thyroid disease     hypothyroidism  . Breast cancer ([place])   . Asthma       Review of Systems     Objective:   Physical Exam Filed Vitals:   01/20/16 1439  BP: 102/74  Pulse: 93  Height: 5\' 7"  (123XX123 m)  Weight: 157 lb 6.4 oz (71.396 kg)  SpO2: 93%   RA  Gen: well appearing HENT: OP clear, TM's clear, neck supple PULM: CTA B, normal percussion CV: RRR, no mgr, trace edema GI: BS+, soft, nontender Derm: no cyanosis or rash Psyche: normal mood and affect      Assessment & Plan:  Cough variant asthma I am convinced that Lindsey Price is steroid-dependent (inhaled steroids at least) due to the fact that she has recurrent cough particularly in the spring time when she stops inhaled steroids. Unfortunately, the inhaled steroid that was recommended by her insurance formulary has been associated with worsening hoarseness.  Plan: I believe she needs to stay on Qvar at least during the high allergy seasons (spring and fall), we will see if her new insurance formulary covers  this. If not we will apply for prior authorization as she did better on this medicine and has significant side effects from the [place]. Follow-up with me in 6 month  Allergic rhinitis Continue current regimen     Current outpatient prescriptions:  .  albuterol (PROVENTIL HFA;VENTOLIN HFA) 108 (90 BASE) MCG/ACT inhaler, Inhale 1 puff into the lungs every 6 (six) hours as needed for wheezing or shortness of breath., Disp: , Rfl:  .  benzonatate (TESSALON) 200 MG capsule, Take 1 capsule (200 mg total) by mouth 3 (three) times daily as needed for cough., Disp: 30 capsule, Rfl: 1 .  cetirizine (ZYRTEC) 10 MG tablet, Take 10 mg by mouth daily., Disp: , Rfl:  .  esomeprazole (NEXIUM) 20 MG capsule, Take 20 mg by mouth daily. , Disp: , Rfl:  .  ezetimibe-simvastatin (VYTORIN) 10-10 MG per tablet, Take 1 tablet by mouth at bedtime., Disp: , Rfl:  .  fluticasone (FLONASE) 50 MCG/ACT nasal spray, Place 1 spray into both nostrils daily., Disp: , Rfl:  .  Fluticasone Propionate, Inhal, 250 MCG/BLIST AEPB, Inhale 1 puff into the lungs 2 (two) times daily., Disp: 60 each, Rfl: 11 .  levothyroxine (SYNTHROID, LEVOTHROID) 100 MCG tablet, Take 100 mcg by mouth daily., Disp: , Rfl:  .  losartan-hydrochlorothiazide (HYZAAR) 100-12.5 MG per tablet, Take 1 tablet by mouth daily., Disp: , Rfl:  .  montelukast (SINGULAIR) 10 MG tablet, Take 10 mg by mouth at bedtime. , Disp: , Rfl:  .  venlafaxine XR (EFFEXOR-XR) 75 MG 24 hr capsule, Take 1 capsule (75 mg total) by mouth at bedtime., Disp: 90 capsule, Rfl: 3 .  simvastatin (ZOCOR) 10 MG tablet, Take 10 mg by mouth at bedtime., Disp: , Rfl:

## 2016-01-20 NOTE — Assessment & Plan Note (Signed)
I am convinced that Sherell is steroid-dependent (inhaled steroids at least) due to the fact that she has recurrent cough particularly in the spring time when she stops inhaled steroids. Unfortunately, the inhaled steroid that was recommended by her insurance formulary has been associated with worsening hoarseness.  Plan: I believe she needs to stay on Qvar at least during the high allergy seasons (spring and fall), we will see if her new insurance formulary covers this. If not we will apply for prior authorization as she did better on this medicine and has significant side effects from the [place]. Follow-up with me in 6 month

## 2016-01-21 ENCOUNTER — Telehealth: Payer: Self-pay | Admitting: Pulmonary Disease

## 2016-01-21 NOTE — Telephone Encounter (Signed)
Patient called to check to see if fax has been received - patient confirmed our fax number. - prm

## 2016-01-21 NOTE — Telephone Encounter (Signed)
Spoke with pt. She is faxing over her formulary. We have not received this yet. Advised her that as soon as this is received we will make sure BQ reviews it. Will route to Ashley's box.

## 2016-01-24 DIAGNOSIS — I10 Essential (primary) hypertension: Secondary | ICD-10-CM | POA: Diagnosis not present

## 2016-01-24 DIAGNOSIS — E039 Hypothyroidism, unspecified: Secondary | ICD-10-CM | POA: Diagnosis not present

## 2016-01-24 DIAGNOSIS — E7800 Pure hypercholesterolemia, unspecified: Secondary | ICD-10-CM | POA: Diagnosis not present

## 2016-01-24 DIAGNOSIS — K219 Gastro-esophageal reflux disease without esophagitis: Secondary | ICD-10-CM | POA: Diagnosis not present

## 2016-01-24 DIAGNOSIS — Z6824 Body mass index (BMI) 24.0-24.9, adult: Secondary | ICD-10-CM | POA: Diagnosis not present

## 2016-01-24 NOTE — Telephone Encounter (Signed)
Fax has not been received. Will await fax.

## 2016-01-25 ENCOUNTER — Ambulatory Visit: Payer: Medicare Other | Admitting: Hematology and Oncology

## 2016-01-25 NOTE — Telephone Encounter (Signed)
Fax still has not been received. lmtcb x1 for pt.

## 2016-01-26 NOTE — Telephone Encounter (Signed)
Routing to [place] for follow up.

## 2016-01-26 NOTE — Telephone Encounter (Signed)
Spoke with pt. She is aware that we have not received this fax. Pt is going to refax this to [place]. Will route message to [place] to follow up on.

## 2016-01-26 NOTE — Telephone Encounter (Signed)
Pt daughter cb, to make sure we received fax this morning, I have received fax and placed it in BQ folder upfront to be passed out at the end of today, please cb at previous number listed if anything further is needed

## 2016-01-28 NOTE — Telephone Encounter (Signed)
BQ has reviewed formulary and there is no comparable covered alternative for pt's Qvar.   lmtcb X1 for pt to figure out which pharmacy she receives medication through, so PA process can be started.

## 2016-02-01 ENCOUNTER — Telehealth: Payer: Self-pay | Admitting: Hematology and Oncology

## 2016-02-01 ENCOUNTER — Encounter: Payer: Self-pay | Admitting: Hematology and Oncology

## 2016-02-01 ENCOUNTER — Ambulatory Visit (HOSPITAL_BASED_OUTPATIENT_CLINIC_OR_DEPARTMENT_OTHER): Payer: PPO | Admitting: Hematology and Oncology

## 2016-02-01 VITALS — BP 136/75 | HR 86 | Temp 98.4°F | Resp 18 | Wt 159.6 lb

## 2016-02-01 DIAGNOSIS — D0591 Unspecified type of carcinoma in situ of right breast: Secondary | ICD-10-CM

## 2016-02-01 DIAGNOSIS — Z853 Personal history of malignant neoplasm of breast: Secondary | ICD-10-CM | POA: Diagnosis not present

## 2016-02-01 MED ORDER — VENLAFAXINE HCL ER 75 MG PO CP24: 75.0000 mg | ORAL_CAPSULE | Freq: Every day | ORAL | Status: DC

## 2016-02-01 MED ORDER — EZETIMIBE 10 MG PO TABS: 10.0000 mg | ORAL_TABLET | Freq: Every day | ORAL | Status: DC

## 2016-02-01 NOTE — Telephone Encounter (Signed)
appt made and avs printed °

## 2016-02-01 NOTE — Assessment & Plan Note (Signed)
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 We reviewed both Aromasin and Effexor. She will have completed 5 years of therapy started 03/04/2010 and completed April 2016.  Breast Cancer Surveillance: 1. Breast exam 02/01/2016: Normal 2. Mammogram 09/14/2015 No abnormalities. Postsurgical changes. Breast Density Category B. I recommended that she get 3-D mammograms for surveillance. Discussed the differences between different breast density categories.  Return to clinic in 1 year for follow-up with survivorship clinic

## 2016-02-01 NOTE — Progress Notes (Signed)
Patient Care Team: Leonides Sake, MD as PCP - General (Family Medicine)  SUMMARY OF ONCOLOGIC HISTORY:   Cancer of right breast, stage 0   09/13/2009 Initial Biopsy Right breast core needle biopsy: High-grade DCIS ER 100%, PR 93%   10/11/2009 Surgery Right breast lumpectomy: High-grade DCIS with necrosis and microcalcifications 1 SLN negative, ER 100%, PR 93%   03/04/2010 -  Anti-estrogen oral therapy Aromasin 25 mg daily with Effexor 75 mg daily for hot flashes    CHIEF COMPLIANT: Surveillance of right breast cancer  INTERVAL HISTORY: Lindsey Price is a 70 year old with above-mentioned C right breast DCIS treated with lumpectomy and 5 years of antiestrogen therapy with Aromasin completed last year. She had to take Effexor for hot flashes. She reports no major health problems or concerns. She denies lumps or nodules in the breast. Recently she had a mammogram in January which was normal.  REVIEW OF SYSTEMS:   Constitutional: Denies fevers, chills or abnormal weight loss Eyes: Denies blurriness of vision Ears, nose, mouth, throat, and face: Denies mucositis or sore throat Respiratory: Denies cough, dyspnea or wheezes Cardiovascular: Denies palpitation, chest discomfort Gastrointestinal:  Denies nausea, heartburn or change in bowel habits Skin: Denies abnormal skin rashes Lymphatics: Denies new lymphadenopathy or easy bruising Neurological:Denies numbness, tingling or new weaknesses Behavioral/Psych: Mood is stable, no new changes  Extremities: No lower extremity edema Breast:  denies any pain or lumps or nodules in either breasts All other systems were reviewed with the patient and are negative.  I have reviewed the past medical history, past surgical history, social history and family history with the patient and they are unchanged from previous note.  ALLERGIES:  is allergic to advair diskus and neosporin.  MEDICATIONS:  Current Outpatient Prescriptions  Medication Sig  Dispense Refill  . albuterol (PROVENTIL HFA;VENTOLIN HFA) 108 (90 BASE) MCG/ACT inhaler Inhale 1 puff into the lungs every 6 (six) hours as needed for wheezing or shortness of breath.    . benzonatate (TESSALON) 200 MG capsule Take 1 capsule (200 mg total) by mouth 3 (three) times daily as needed for cough. 30 capsule 1  . cetirizine (ZYRTEC) 10 MG tablet Take 10 mg by mouth daily.    Marland Kitchen esomeprazole (NEXIUM) 20 MG capsule Take 20 mg by mouth daily.     Marland Kitchen ezetimibe-simvastatin (VYTORIN) 10-10 MG per tablet Take 1 tablet by mouth at bedtime.    . fluticasone (FLONASE) 50 MCG/ACT nasal spray Place 1 spray into both nostrils daily.    . Fluticasone Propionate, Inhal, 250 MCG/BLIST AEPB Inhale 1 puff into the lungs 2 (two) times daily. 60 each 11  . levothyroxine (SYNTHROID, LEVOTHROID) 100 MCG tablet Take 100 mcg by mouth daily.    Marland Kitchen losartan-hydrochlorothiazide (HYZAAR) 100-12.5 MG per tablet Take 1 tablet by mouth daily.    . montelukast (SINGULAIR) 10 MG tablet Take 10 mg by mouth at bedtime.     . simvastatin (ZOCOR) 10 MG tablet Take 10 mg by mouth at bedtime.    Marland Kitchen venlafaxine XR (EFFEXOR-XR) 75 MG 24 hr capsule Take 1 capsule (75 mg total) by mouth at bedtime. 90 capsule 3   No current facility-administered medications for this visit.    PHYSICAL EXAMINATION: ECOG PERFORMANCE STATUS: 0 - Asymptomatic  Filed Vitals:   02/01/16 1353  BP: 136/75  Pulse: 86  Temp: 98.4 F (36.9 C)  Resp: 18   Filed Weights   02/01/16 1353  Weight: 159 lb 9.6 oz (72.394 kg)  GENERAL:alert, no distress and comfortable SKIN: skin color, texture, turgor are normal, no rashes or significant lesions EYES: normal, Conjunctiva are pink and non-injected, sclera clear OROPHARYNX:no exudate, no erythema and lips, buccal mucosa, and tongue normal  NECK: supple, thyroid normal size, non-tender, without nodularity LYMPH:  no palpable lymphadenopathy in the cervical, axillary or inguinal LUNGS: clear to  auscultation and percussion with normal breathing effort HEART: regular rate & rhythm and no murmurs and no lower extremity edema ABDOMEN:abdomen soft, non-tender and normal bowel sounds MUSCULOSKELETAL:no cyanosis of digits and no clubbing  NEURO: alert & oriented x 3 with fluent speech, no focal motor/sensory deficits EXTREMITIES: No lower extremity edema BREAST: No palpable masses or nodules in either right or left breasts. No palpable axillary supraclavicular or infraclavicular adenopathy no breast tenderness or nipple discharge. (exam performed in the presence of a chaperone)  LABORATORY DATA:  I have reviewed the data as listed   Chemistry      Component Value Date/Time   NA 143 05/15/2014 0908   NA 140 03/23/2011 0859   NA 141 05/27/2010 1030   K 4.3 05/15/2014 0908   K 4.3 03/23/2011 0859   K 3.9 05/27/2010 1030   CL 106 10/31/2012 0901   CL 104 03/23/2011 0859   CL 101 05/27/2010 1030   CO2 25 05/15/2014 0908   CO2 26 03/23/2011 0859   CO2 29 05/27/2010 1030   BUN 19.1 05/15/2014 0908   BUN 22 03/23/2011 0859   BUN 23* 05/27/2010 1030   CREATININE 0.8 05/15/2014 0908   CREATININE 0.78 03/23/2011 0859   CREATININE 0.8 05/27/2010 1030      Component Value Date/Time   CALCIUM 9.2 05/15/2014 0908   CALCIUM 9.4 03/23/2011 0859   CALCIUM 9.5 05/27/2010 1030   ALKPHOS 129 05/15/2014 0908   ALKPHOS 94 03/23/2011 0859   ALKPHOS 125* 05/27/2010 1030   AST 31 05/15/2014 0908   AST 29 03/23/2011 0859   AST 31 05/27/2010 1030   ALT 27 05/15/2014 0908   ALT 23 03/23/2011 0859   ALT 24 05/27/2010 1030   BILITOT 0.66 05/15/2014 0908   BILITOT 0.7 03/23/2011 0859   BILITOT 0.90 05/27/2010 1030       Lab Results  Component Value Date   WBC 4.1 05/15/2014   HGB 14.6 05/15/2014   HCT 44.9 05/15/2014   MCV 95.6 05/15/2014   PLT 218 05/15/2014   NEUTROABS 2.0 05/15/2014     ASSESSMENT & PLAN:  Cancer of right breast, stage 0 Right breast high-grade DCIS diagnosed  09/13/2009 ER 100% PR 93%: Currently on antiestrogen therapy with Aromasin tolerating it very well along with Effexor hot flashes. We reviewed both Aromasin and Effexor. She will have completed 5 years of therapy started 03/04/2010 and completed April 2016.  Breast Cancer Surveillance: 1. Breast exam 02/01/2016: Normal 2. Mammogram 09/14/2015 No abnormalities. Postsurgical changes. Breast Density Category B. I recommended that she get 3-D mammograms for surveillance. Discussed the differences between different breast density categories.  Patient is going on a vacation to [place] next week. Return to clinic in 1 year for follow-up with survivorship clinic   No orders of the defined types were placed in this encounter.   The patient has a good understanding of the overall plan. she agrees with it. she will call with any problems that may develop before the next visit here.   Rulon Eisenmenger, MD 02/01/2016

## 2016-02-01 NOTE — Telephone Encounter (Signed)
Called [facility] pharmacy and they will fax a PA to be completed for the Qvar. Awaiting fax.

## 2016-02-02 NOTE — Telephone Encounter (Signed)
Called pt's insurance company >> [phone]. PA can't be initiated over the phone per [place]. PA was initiated on Cover My Meds. Key: [id number]. Will await PA decision.

## 2016-02-03 NOTE — Telephone Encounter (Signed)
Patient received Qvar today - she has questions on how to use it -prm

## 2016-02-03 NOTE — Telephone Encounter (Signed)
Spoke with pt. States that Qvar has been approved through her insurance. She picked it up from her pharmacy. She wanted to make sure she didn't need to use a spacer like she had in the past. Advised her that it is not necessary to use a spacer. Nothing further was needed.

## 2016-04-27 DIAGNOSIS — Z0100 Encounter for examination of eyes and vision without abnormal findings: Secondary | ICD-10-CM | POA: Diagnosis not present

## 2016-04-27 DIAGNOSIS — H2513 Age-related nuclear cataract, bilateral: Secondary | ICD-10-CM | POA: Diagnosis not present

## 2016-06-09 DIAGNOSIS — I10 Essential (primary) hypertension: Secondary | ICD-10-CM | POA: Diagnosis not present

## 2016-06-09 DIAGNOSIS — E039 Hypothyroidism, unspecified: Secondary | ICD-10-CM | POA: Diagnosis not present

## 2016-06-09 DIAGNOSIS — E7800 Pure hypercholesterolemia, unspecified: Secondary | ICD-10-CM | POA: Diagnosis not present

## 2016-06-15 ENCOUNTER — Other Ambulatory Visit: Payer: Self-pay | Admitting: General Surgery

## 2016-06-15 DIAGNOSIS — E7800 Pure hypercholesterolemia, unspecified: Secondary | ICD-10-CM | POA: Diagnosis not present

## 2016-06-15 DIAGNOSIS — Z23 Encounter for immunization: Secondary | ICD-10-CM | POA: Diagnosis not present

## 2016-06-15 DIAGNOSIS — F419 Anxiety disorder, unspecified: Secondary | ICD-10-CM | POA: Diagnosis not present

## 2016-06-15 DIAGNOSIS — E2839 Other primary ovarian failure: Secondary | ICD-10-CM | POA: Diagnosis not present

## 2016-06-15 DIAGNOSIS — Z6824 Body mass index (BMI) 24.0-24.9, adult: Secondary | ICD-10-CM | POA: Diagnosis not present

## 2016-06-15 DIAGNOSIS — J45909 Unspecified asthma, uncomplicated: Secondary | ICD-10-CM | POA: Diagnosis not present

## 2016-06-15 DIAGNOSIS — Z853 Personal history of malignant neoplasm of breast: Secondary | ICD-10-CM

## 2016-06-15 DIAGNOSIS — K219 Gastro-esophageal reflux disease without esophagitis: Secondary | ICD-10-CM | POA: Diagnosis not present

## 2016-06-15 DIAGNOSIS — E039 Hypothyroidism, unspecified: Secondary | ICD-10-CM | POA: Diagnosis not present

## 2016-06-15 DIAGNOSIS — Z139 Encounter for screening, unspecified: Secondary | ICD-10-CM | POA: Diagnosis not present

## 2016-06-15 DIAGNOSIS — I10 Essential (primary) hypertension: Secondary | ICD-10-CM | POA: Diagnosis not present

## 2016-06-16 DIAGNOSIS — Z23 Encounter for immunization: Secondary | ICD-10-CM | POA: Diagnosis not present

## 2016-09-19 ENCOUNTER — Ambulatory Visit
Admission: RE | Admit: 2016-09-19 | Discharge: 2016-09-19 | Disposition: A | Discharge status: Home / Self Care | Payer: PPO | Source: Ambulatory Visit | Attending: General Surgery | Admitting: General Surgery

## 2016-09-19 DIAGNOSIS — Z853 Personal history of malignant neoplasm of breast: Secondary | ICD-10-CM

## 2016-09-19 DIAGNOSIS — R928 Other abnormal and inconclusive findings on diagnostic imaging of breast: Secondary | ICD-10-CM | POA: Diagnosis not present

## 2016-11-08 DIAGNOSIS — H524 Presbyopia: Secondary | ICD-10-CM | POA: Diagnosis not present

## 2016-11-08 DIAGNOSIS — H2513 Age-related nuclear cataract, bilateral: Secondary | ICD-10-CM | POA: Diagnosis not present

## 2016-11-10 DIAGNOSIS — I10 Essential (primary) hypertension: Secondary | ICD-10-CM | POA: Diagnosis not present

## 2016-11-10 DIAGNOSIS — E039 Hypothyroidism, unspecified: Secondary | ICD-10-CM | POA: Diagnosis not present

## 2016-11-10 DIAGNOSIS — E7800 Pure hypercholesterolemia, unspecified: Secondary | ICD-10-CM | POA: Diagnosis not present

## 2016-11-16 DIAGNOSIS — Z6824 Body mass index (BMI) 24.0-24.9, adult: Secondary | ICD-10-CM | POA: Diagnosis not present

## 2016-11-16 DIAGNOSIS — J309 Allergic rhinitis, unspecified: Secondary | ICD-10-CM | POA: Diagnosis not present

## 2016-11-16 DIAGNOSIS — E2839 Other primary ovarian failure: Secondary | ICD-10-CM | POA: Diagnosis not present

## 2016-11-16 DIAGNOSIS — E039 Hypothyroidism, unspecified: Secondary | ICD-10-CM | POA: Diagnosis not present

## 2016-11-16 DIAGNOSIS — I10 Essential (primary) hypertension: Secondary | ICD-10-CM | POA: Diagnosis not present

## 2016-11-16 DIAGNOSIS — Z23 Encounter for immunization: Secondary | ICD-10-CM | POA: Diagnosis not present

## 2016-11-16 DIAGNOSIS — Z9181 History of falling: Secondary | ICD-10-CM | POA: Diagnosis not present

## 2016-11-16 DIAGNOSIS — Z1389 Encounter for screening for other disorder: Secondary | ICD-10-CM | POA: Diagnosis not present

## 2016-11-16 DIAGNOSIS — E7800 Pure hypercholesterolemia, unspecified: Secondary | ICD-10-CM | POA: Diagnosis not present

## 2016-11-28 DIAGNOSIS — H25812 Combined forms of age-related cataract, left eye: Secondary | ICD-10-CM | POA: Diagnosis not present

## 2016-11-28 DIAGNOSIS — H2512 Age-related nuclear cataract, left eye: Secondary | ICD-10-CM | POA: Diagnosis not present

## 2016-12-26 DIAGNOSIS — H2511 Age-related nuclear cataract, right eye: Secondary | ICD-10-CM | POA: Diagnosis not present

## 2016-12-26 DIAGNOSIS — H25811 Combined forms of age-related cataract, right eye: Secondary | ICD-10-CM | POA: Diagnosis not present

## 2017-01-11 ENCOUNTER — Ambulatory Visit: Payer: Self-pay | Admitting: Pulmonary Disease

## 2017-01-18 ENCOUNTER — Other Ambulatory Visit: Payer: Self-pay | Admitting: Hematology and Oncology

## 2017-01-22 ENCOUNTER — Ambulatory Visit: Payer: Self-pay | Admitting: Pulmonary Disease

## 2017-01-24 DIAGNOSIS — H35351 Cystoid macular degeneration, right eye: Secondary | ICD-10-CM | POA: Diagnosis not present

## 2017-01-25 DIAGNOSIS — D0511 Intraductal carcinoma in situ of right breast: Secondary | ICD-10-CM | POA: Diagnosis not present

## 2017-01-30 ENCOUNTER — Encounter: Payer: Self-pay | Admitting: Adult Health

## 2017-01-31 ENCOUNTER — Encounter: Payer: Self-pay | Admitting: Nurse Practitioner

## 2017-02-01 ENCOUNTER — Ambulatory Visit: Payer: Self-pay | Admitting: Pulmonary Disease

## 2017-02-07 DIAGNOSIS — H59031 Cystoid macular edema following cataract surgery, right eye: Secondary | ICD-10-CM | POA: Diagnosis not present

## 2017-02-21 ENCOUNTER — Ambulatory Visit: Payer: Self-pay | Admitting: Pulmonary Disease

## 2017-02-21 DIAGNOSIS — H59031 Cystoid macular edema following cataract surgery, right eye: Secondary | ICD-10-CM | POA: Diagnosis not present

## 2017-03-01 NOTE — Progress Notes (Signed)
CLINIC:  Survivorship   REASON FOR VISIT:  Routine follow-up for history of breast cancer.   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  Overall, she reports feeling quite well. She denies any questions or concerns today.  She sees her PCP annually.  She additionally still sees her surgeon, Dr. Marlou Starks.  She has her mammograms routinely and is up to date with colonoscopy.  She does have a dermatologist who she sees as needed.  She exercises daily.  We prescribe Effexor for her hot flashes.  She has attempted to taper off of this and cannot.      REVIEW OF SYSTEMS:  Review of Systems  Constitutional: Negative for appetite change, chills, diaphoresis, fever and unexpected weight change.  HENT:   Negative for hearing loss and lump/mass.   Eyes: Negative for eye problems and icterus.  Respiratory: Negative for chest tightness, cough and shortness of breath.   Cardiovascular: Negative for chest pain, leg swelling and palpitations.  Gastrointestinal: Negative for abdominal distention and abdominal pain.  Endocrine: Negative for hot flashes.  Genitourinary: Negative for difficulty urinating.   Musculoskeletal: Negative for arthralgias.  Skin: Negative for itching and rash.  Neurological: Negative for dizziness and extremity weakness.  Hematological: Negative for adenopathy. Does not bruise/bleed easily.  Psychiatric/Behavioral: Negative for depression. The patient is not nervous/anxious.     Breast: Denies any new nodularity, masses, tenderness, nipple changes, or nipple discharge.       PAST MEDICAL/SURGICAL HISTORY:  Past Medical History:  Diagnosis Date  . Arthralgia 09/11/2011  . Asthma   . Breast cancer ([place])   . 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 Hyperlipidemia   . Hypertension   . Insomnia 09/11/2011  . Thyroid disease    hypothyroidism   Past Surgical History:  Procedure Laterality Date  . ABDOMINAL HYSTERECTOMY  1975   partial  . BREAST LUMPECTOMY  2011   right breast  . COLONOSCOPY WITH PROPOFOL N/A 04/14/2014   Procedure: COLONOSCOPY WITH PROPOFOL;  Surgeon: Garlan Fair, MD;  Location: [facility];  Service: Endoscopy;  Laterality: N/A;  . ROTATOR CUFF REPAIR Left 04/2013   Dr. Para March     ALLERGIES:  Allergies  Allergen Reactions  . Advair Diskus [Fluticasone-Salmeterol]     Hoarseness.  . Neosporin [Neomycin-Polymyxin-Gramicidin] Itching     CURRENT MEDICATIONS:  Outpatient Encounter Prescriptions as of 03/02/2017  Medication Sig Note  . cetirizine (ZYRTEC) 10 MG tablet Take 10 mg by mouth daily.   Lindsey Kitchen ezetimibe (ZETIA) 10 MG tablet Take 1 tablet (10 mg total) by mouth daily.   . fluticasone (FLONASE) 50 MCG/ACT nasal spray Place 1 spray into both nostrils daily.   . Fluticasone Propionate, Inhal, 250 MCG/BLIST AEPB Inhale 1 puff into the lungs 2 (two) times daily.   Lindsey Kitchen levothyroxine (SYNTHROID, LEVOTHROID) 100 MCG tablet Take 100 mcg by mouth daily.   Lindsey Kitchen  losartan-hydrochlorothiazide (HYZAAR) 100-12.5 MG per tablet Take 1 tablet by mouth daily.   . montelukast (SINGULAIR) 10 MG tablet Take 10 mg by mouth at bedtime.  03/23/2014: -  . simvastatin (ZOCOR) 10 MG tablet Take 10 mg by mouth at bedtime. 01/20/2016: Received from: External Pharmacy Received Sig:   . venlafaxine XR (EFFEXOR-XR) 75 MG 24 hr capsule TAKE 1 CAPSULE (75 MG TOTAL) BY MOUTH AT BEDTIME.   Lindsey Kitchen albuterol (PROVENTIL HFA;VENTOLIN HFA) 108 (90 BASE) MCG/ACT  inhaler Inhale 1 puff into the lungs every 6 (six) hours as needed for wheezing or shortness of breath.   . esomeprazole (NEXIUM) 20 MG capsule Take 20 mg by mouth daily.     No facility-administered encounter medications on file as of 03/02/2017.      ONCOLOGIC FAMILY HISTORY:  Family History  Problem Relation Age of Onset  . Cancer Mother        BREAST  . Cancer Father        pancreatic  . Cancer Sister        sebacous cell carcinoma  . Cancer Maternal Aunt        lung  . Cancer Paternal Uncle        colon, stomach      SOCIAL HISTORY:  Lindsey Price is married and lives with her husband in [place], [place].  She has 2 children and they live in [place] and [place].  Ms. Price is currently retired.  She denies any current or history of tobacco, alcohol, or illicit drug use.     PHYSICAL EXAMINATION:  Vital Signs: Vitals:   03/02/17 1451  BP: 115/63  Pulse: 73  Resp: 18  Temp: 98.3 F (36.8 C)   Filed Weights   03/02/17 1451  Weight: 158 lb 4.8 oz (71.8 kg)   General: Well-nourished, well-appearing female in no acute distress.  Unaccompanied today.   HEENT: Head is normocephalic.  Pupils equal and reactive to light. Conjunctivae clear without exudate.  Sclerae anicteric. Oral mucosa is pink, moist.  Oropharynx is pink without lesions or erythema.  Lymph: No cervical, supraclavicular, or infraclavicular lymphadenopathy noted on palpation.  Cardiovascular: Regular rate and rhythm.Lindsey Kitchen Respiratory: Clear to auscultation bilaterally. Chest expansion symmetric; breathing non-labored.  Breast Exam:  -Left breast: No appreciable masses on palpation. No skin redness, thickening, or peau d'orange appearance; no nipple retraction or nipple discharge;  -Right breast: No appreciable masses on palpation. No skin redness, thickening, or peau d'orange appearance; no nipple retraction or nipple discharge; mild distortion in symmetry at previous lumpectomy site well  healed scar without erythema or nodularity. -Axilla: No axillary adenopathy bilaterally.  GI: Abdomen soft and round; non-tender, non-distended. Bowel sounds normoactive. No hepatosplenomegaly.   GU: Deferred.  Neuro: No focal deficits. Steady gait.  Psych: Mood and affect normal and appropriate for situation.  MSK: No focal spinal tenderness to palpation, full range of motion in bilateral upper extremities Extremities: No edema. Skin: Warm and dry.  LABORATORY DATA:  None for this visit   DIAGNOSTIC IMAGING:  Most recent mammogram:       ASSESSMENT AND PLAN:  Ms.. Price is a pleasant 71 y.o. female with history of Stage 0 right breast DCIS, ER+/PR+, diagnosed in 09/2009, treated with lumpectomy and anti-estrogen therapy with Aromasin x 5 years, completing in 03/2015.  She [facility] for surveillance and routine follow-up.   1. History of breast cancer:  Lindsey Price is currently clinically and radiographically without evidence of disease  or recurrence of breast cancer. She will be due for mammogram in 09/2017; orders placed today.  I encouraged her to call me with any questions or concerns before her next visit at the cancer center, and I would be happy to see her sooner, if needed.    2. Bone health:  Given Lindsey Price's age, history of breast cancer, and her previous anti-estrogen therapy with Aromasin, she is at risk for bone demineralization.  She was given education on specific food and activities to promote bone health.  I will defer to her PCP regarding future bone density testing and management.   3. Cancer screening:  Due to Lindsey Price's history and her age, she should receive screening for skin cancers, colon cancer. She was encouraged to follow-up with her PCP for appropriate cancer screenings.   4. Health maintenance and wellness promotion: Lindsey Price was encouraged to consume 5-7 servings of fruits and vegetables per day. She was also encouraged to  engage in moderate to vigorous exercise for 30 minutes per day most days of the week. She was instructed to limit her alcohol consumption and continue to abstain from tobacco use.    Dispo:  -Return to cancer center in one year for LTS follow up -Mammogram in 09/2017   A total of (30) minutes of face-to-face time was spent with this patient with greater than 50% of that time in counseling and care-coordination.   Gardenia Phlegm, NP Survivorship Program [facility] [phone]   Note: PRIMARY CARE PROVIDER Leonides Sake, [name] [phone]

## 2017-03-02 ENCOUNTER — Other Ambulatory Visit: Payer: Self-pay | Admitting: *Deleted

## 2017-03-02 ENCOUNTER — Ambulatory Visit (HOSPITAL_BASED_OUTPATIENT_CLINIC_OR_DEPARTMENT_OTHER): Payer: PPO | Admitting: Adult Health

## 2017-03-02 VITALS — BP 115/63 | HR 73 | Temp 98.3°F | Resp 18 | Ht 67.0 in | Wt 158.3 lb

## 2017-03-02 DIAGNOSIS — R232 Flushing: Secondary | ICD-10-CM

## 2017-03-02 DIAGNOSIS — E039 Hypothyroidism, unspecified: Secondary | ICD-10-CM | POA: Diagnosis not present

## 2017-03-02 DIAGNOSIS — Z1239 Encounter for other screening for malignant neoplasm of breast: Secondary | ICD-10-CM

## 2017-03-02 DIAGNOSIS — D0591 Unspecified type of carcinoma in situ of right breast: Secondary | ICD-10-CM

## 2017-03-02 DIAGNOSIS — I10 Essential (primary) hypertension: Secondary | ICD-10-CM

## 2017-03-02 DIAGNOSIS — Z86000 Personal history of in-situ neoplasm of breast: Secondary | ICD-10-CM | POA: Diagnosis not present

## 2017-03-02 MED ORDER — VENLAFAXINE HCL ER 75 MG PO CP24: 75.0000 mg | ORAL_CAPSULE | Freq: Every day | ORAL | 3 refills | Status: DC

## 2017-03-05 ENCOUNTER — Other Ambulatory Visit: Payer: Self-pay | Admitting: Adult Health

## 2017-03-05 ENCOUNTER — Encounter: Payer: Self-pay | Admitting: Adult Health

## 2017-03-12 ENCOUNTER — Ambulatory Visit (INDEPENDENT_AMBULATORY_CARE_PROVIDER_SITE_OTHER): Payer: PPO | Admitting: Family Medicine

## 2017-03-12 ENCOUNTER — Encounter: Payer: Self-pay | Admitting: Family Medicine

## 2017-03-12 VITALS — BP 133/80 | HR 67 | Ht 67.0 in | Wt 161.0 lb

## 2017-03-12 DIAGNOSIS — Z87891 Personal history of nicotine dependence: Secondary | ICD-10-CM | POA: Diagnosis not present

## 2017-03-12 DIAGNOSIS — J45991 Cough variant asthma: Secondary | ICD-10-CM

## 2017-03-12 DIAGNOSIS — K219 Gastro-esophageal reflux disease without esophagitis: Secondary | ICD-10-CM | POA: Insufficient documentation

## 2017-03-12 DIAGNOSIS — D0591 Unspecified type of carcinoma in situ of right breast: Secondary | ICD-10-CM | POA: Diagnosis not present

## 2017-03-12 DIAGNOSIS — F4320 Adjustment disorder, unspecified: Secondary | ICD-10-CM

## 2017-03-12 DIAGNOSIS — J4530 Mild persistent asthma, uncomplicated: Secondary | ICD-10-CM

## 2017-03-12 DIAGNOSIS — E782 Mixed hyperlipidemia: Secondary | ICD-10-CM

## 2017-03-12 DIAGNOSIS — E039 Hypothyroidism, unspecified: Secondary | ICD-10-CM

## 2017-03-12 DIAGNOSIS — J3089 Other allergic rhinitis: Secondary | ICD-10-CM | POA: Diagnosis not present

## 2017-03-12 DIAGNOSIS — H2513 Age-related nuclear cataract, bilateral: Secondary | ICD-10-CM

## 2017-03-12 DIAGNOSIS — Z809 Family history of malignant neoplasm, unspecified: Secondary | ICD-10-CM

## 2017-03-12 DIAGNOSIS — H269 Unspecified cataract: Secondary | ICD-10-CM | POA: Insufficient documentation

## 2017-03-12 DIAGNOSIS — I10 Essential (primary) hypertension: Secondary | ICD-10-CM | POA: Diagnosis not present

## 2017-03-12 NOTE — Assessment & Plan Note (Addendum)
Patient will follow-up with Dr. Payton Mccallum the hematology oncology doctor as well as her general surgeon yearly   Patient gets breast exams and screening mammograms from them.    Patient has historically refused to go on Fosamax or the like and was told by ONC that her bone density would be affected by her cancer treatment medications.     I explained to patient normal surveillances every 2 years for bone density but she can discuss this further with her oncologist

## 2017-03-12 NOTE — Assessment & Plan Note (Signed)
Symptoms well-controlled.  She feels this also helps her differentiate this between the cough associated with her asthma.

## 2017-03-12 NOTE — Assessment & Plan Note (Signed)
Blood pressure well controlled today and at goal for age.  Continue dietary modifications and daily exercise.  Will obtain labs in near future as it's been over a year per patient.

## 2017-03-12 NOTE — Assessment & Plan Note (Signed)
Effexor - working well and mood stable, com complaints

## 2017-03-12 NOTE — Assessment & Plan Note (Addendum)
Follows up with pulmonary every 6 months.  Symptoms under good control currently.  She has no complaints.

## 2017-03-12 NOTE — Assessment & Plan Note (Signed)
Continue medications for now.   Patient denies any history of liver abnormality or intolerance to medications,   she reports last blood work approximately 6 months ago was good control of chol  Asked patient to get me her most recent labs that were done at her prior PCP

## 2017-03-12 NOTE — Assessment & Plan Note (Signed)
Asked patient to get me labs from prior PCP.  Patient denies any side effects to medications or difficulties

## 2017-03-12 NOTE — Progress Notes (Signed)
New patient office visit note:  Impression and Recommendations:    1. Essential hypertension   2. Cancer of right breast, stage 0   3. Mild persistent reactive airway disease without complication   4. Hyperlipemia, mixed   5. Age-related nuclear cataract of both eyes   6. Acquired hypothyroidism   7. Environmental and seasonal allergies   8. Adjustment disorder, unspecified type   9. Gastroesophageal reflux disease, esophagitis presence not specified   10. Family history of cancer   11. History of tobacco abuse   12. Cough variant asthma      Cough variant asthma Follows up with pulmonary every 6 months.  Symptoms under good control currently.  She has no complaints.    HTN (hypertension) Blood pressure well controlled today and at goal for age.  Continue dietary modifications and daily exercise.  Will obtain labs in near future as it's been over a year per patient.    Hyperlipemia, mixed Continue medications for now.   Patient denies any history of liver abnormality or intolerance to medications,   she reports last blood work approximately 6 months ago was good control of chol  Asked patient to get me her most recent labs that were done at her prior PCP  Cancer of right breast, stage 0 Patient will follow-up with Dr. Payton Mccallum the hematology oncology doctor as well as her general surgeon yearly   Patient gets breast exams and screening mammograms from them.    Patient has historically refused to go on Fosamax or the like and was told by ONC that her bone density would be affected by her cancer treatment medications.     I explained to patient normal surveillances every 2 years for bone density but she can discuss this further with her oncologist      Hypothyroidism Asked patient to get me labs from prior PCP.  Patient denies any side effects to medications or difficulties    Adjustment disorder Effexor - working well and mood stable, com complaints       GERD (gastroesophageal reflux disease) Symptoms well-controlled.  She feels this also helps her differentiate this between the cough associated with her asthma.   The patient was counseled, risk factors were discussed, anticipatory guidance given.   New Prescriptions   No medications on file     Discontinued Medications   FLUTICASONE PROPIONATE, INHAL, 250 MCG/BLIST AEPB    Inhale 1 puff into the lungs 2 (two) times daily.      Orders Placed This Encounter  Procedures  . CBC with Differential/Platelet  . Comprehensive metabolic panel  . Hemoglobin A1c  . Lipid panel  . TSH  . T4, free  . VITAMIN D 25 Hydroxy (Vit-D Deficiency, Fractures)     Gross side effects, risk and benefits, and alternatives of medications discussed with patient.  Patient is aware that all medications have potential side effects and we are unable to predict every side effect or drug-drug interaction that may occur.  Expresses verbal understanding and consents to current therapy plan and treatment regimen.  Return for Follow-up near future for fasting blood work then office visit 1 week later with me.  Please see AVS handed out to patient at the end of our visit for further patient instructions/ counseling done pertaining to today's office visit.    Note: This document was prepared using Dragon voice recognition software and may include unintentional dictation errors.  ----------------------------------------------------------------------------------------------------------------------    Subjective:    Chief complaint:  Chief Complaint  Patient presents with  . Establish Care     HPI: Lindsey Price is a pleasant 71 y.o. female who presents to [place] at [facility] today to review their medical history with me and establish care.   I asked the patient to review their chronic problem list with me to ensure everything was updated and accurate.    All recent  office visits with other providers, any medical records that patient brought in etc  - I reviewed today.     Also asked pt to get me medical records from [facility] providers/ specialists that they had seen within the past 3-5 years- if they are in private practice and/or do not work for a [employer], [facility], [place], [place] or [employer] owned practice.  Told them to call their specialists to clarify this if they are not sure.   - Patient exercises on the elliptical 60 minutes 7 days per week.  She also spends her time cooking for sick folks at church.  She is married to [place] for over 50 years.  She is retired Water engineer.  She has a Public librarian.  She has 2 boys in their 65s and 3 grandchildren and children.      Problem  Htn (Hypertension)   Bp meds for 30+ yrs.   Had dry cough from [employer] in past.   Hyzaar for many yrs now. Usually well controlled at home.  No sx at all   Hyperlipemia, Mixed   Probles 30 yrs or more, no fam hx, been on multiple meds in past--> vytorin but too costly.  Now on indiv- simvastatin and zetia.     Hypothyroidism   Strong family history hypothyroidism.  Patient on meds many years.  No complaints.   Adjustment Disorder   Initially had gone on Effexor many years ago for postmenopausal symptoms-  flushing/ hot flashes.  Then once she was dx w breast cancer and underwent treatment, patient has stayed on it for mood stability/ depressed mood per pt     Gerd (Gastroesophageal Reflux Disease)   On meds for several years.  Symptoms well controlled.   Cough Variant Asthma   Sees Pulm- Dr Lake Bells- for her asthma Takes Qvar regularly, zyrtec, singulair, and albuterol rescue occ  Simple spirometry June 2016 without significant airflow obstruction Exhaled nitric oxide 58 ppm 02/10/2015 01/2015 CXR read as normal   Cancer of Right Breast, Stage 0   Patient diagnosed with high grade DCIS on 09/13/2009. She underwent needle guided right partial mastectomy with  right axillary node biopsy on 10/11/2009. Pathology showed high grade DCIS, no invasive carcinoma. Margins negative. Lymph node negative. ER positive at 100%. PR positive at 93%.  --> Dr Lindi Adie [facility] )    Environmental and Seasonal Allergies  Family history of multiple cancers   Both paternal uncles died of colon cancer in their 57s.,  Mother breast CA, father- pancreatic ca; mat Aunt- lung ca. Sister- sebaceous cell carcinoma   History of Tobacco Abuse   Former Smoker; Quit 12/09/1978; Smoked 1 pack/day for 15 years; Smoked: Cigarettes.  Smokeless Tobacco: Former user of smokeless tobacco; Quit 09/04/1978.      Insomnia  Cataracts, bilateral- s/p surgery.      Wt Readings from Last 3 Encounters:  03/12/17 161 lb (73 kg)  03/02/17 158 lb 4.8 oz (71.8 kg)  02/01/16 159 lb 9.6 oz (72.4 kg)   BP Readings from Last 3 Encounters:  03/12/17 133/80  03/02/17 115/63  02/01/16 136/75   Pulse Readings from Last 3 Encounters:  03/12/17 67  03/02/17 73  02/01/16 86   BMI Readings from Last 3 Encounters:  03/12/17 25.22 kg/m  03/02/17 24.79 kg/m  02/01/16 25.00 kg/m    Patient Care Team    Relationship Specialty Notifications Start End  Mellody Dance, DO PCP - General Family Medicine  03/12/17   Juanito Doom, MD Consulting Physician Pulmonary Disease  03/12/17   Elsie Saas, MD Consulting Physician Orthopedic Surgery  03/12/17    Comment: L rot cuff repair  Allyn Kenner, MD Consulting Physician Dermatology  03/12/17    Comment: sister with sebacious cell CA  Nicholas Lose, MD Consulting Physician Hematology and Oncology  03/12/17 03/12/17  Katy Apo, MD Consulting Physician Ophthalmology  03/12/17   Jovita Kussmaul, MD Consulting Physician General Surgery  03/12/17   Jovita Kussmaul, MD Consulting Physician General Surgery  03/12/17 03/12/17  Nicholas Lose, MD Consulting Physician Hematology and Oncology  03/12/17     Patient Active Problem List   Diagnosis Date  Noted  . HTN (hypertension) 03/12/2017    Priority: High  . Hyperlipemia, mixed 03/12/2017    Priority: High  . Hypothyroidism 03/12/2017    Priority: Medium  . Adjustment disorder 03/12/2017    Priority: Medium  . GERD (gastroesophageal reflux disease) 03/12/2017    Priority: Medium  . Cough variant asthma 02/23/2015    Priority: Medium  . Cancer of right breast, stage 0 09/11/2011    Priority: Medium  . Environmental and seasonal allergies 03/12/2017    Priority: Low  . Family history of multiple cancers 03/12/2017    Priority: Low  . History of tobacco abuse 03/12/2017    Priority: Low  . Insomnia 09/11/2011    Priority: Low  . Cataracts, bilateral- s/p surgery. 03/12/2017  . Allergic rhinitis 03/23/2015  . Arthralgia 09/11/2011     Past Medical History:  Diagnosis Date  . Arthralgia 09/11/2011  . Asthma   . Breast cancer ([place])   . 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 Hyperlipidemia   . Hypertension   . Insomnia   . Thyroid disease    hypothyroidism     Past Medical History:  Diagnosis Date  . Arthralgia 09/11/2011  . Asthma   . Breast cancer ([place])   . 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 Hyperlipidemia   . Hypertension   . Insomnia   . Thyroid disease    hypothyroidism     Past Surgical History:  Procedure Laterality Date  . ABDOMINAL HYSTERECTOMY  1975   partial  . BREAST LUMPECTOMY  2011   right breast  . COLONOSCOPY WITH PROPOFOL N/A 04/14/2014   Procedure: COLONOSCOPY WITH PROPOFOL;  Surgeon: Garlan Fair, MD;  Location: [facility];  Service: Endoscopy;  Laterality: N/A;  . ROTATOR CUFF REPAIR Left 04/2013   Dr. Para March     Family History  Problem Relation Age of Onset  . Cancer Mother        BREAST  . Cancer Father        pancreatic  . Cancer Sister        sebacous cell carcinoma  . Cancer Maternal Aunt        lung  . Cancer Paternal Uncle        colon, stomach     History  Drug Use No      History  Alcohol  Use  . 0.0 oz/week    Comment: SOCIALLY wine     History  Smoking Status  . Former Smoker  . Packs/day: 1.00  . Years: 15.00  . Types: Cigarettes  . Quit date: 12/09/1978  Smokeless Tobacco  . Former Systems developer  . Quit date: 09/04/1978     Outpatient Encounter Prescriptions as of 03/12/2017  Medication Sig Note  . albuterol (PROVENTIL HFA;VENTOLIN HFA) 108 (90 BASE) MCG/ACT inhaler Inhale 1 puff into the lungs every 6 (six) hours as needed for wheezing or shortness of breath.   . beclomethasone (QVAR) 80 MCG/ACT inhaler Inhale into the lungs 2 (two) times daily.   . cetirizine (ZYRTEC) 10 MG tablet Take 10 mg by mouth daily.   Marland Kitchen esomeprazole (NEXIUM) 20 MG capsule Take 20 mg by mouth daily.    Marland Kitchen ezetimibe (ZETIA) 10 MG tablet Take 1 tablet (10 mg total) by mouth daily.   . fluticasone (FLONASE) 50 MCG/ACT nasal spray Place 1 spray into both nostrils daily.   Marland Kitchen levothyroxine (SYNTHROID, LEVOTHROID) 100 MCG tablet Take 100 mcg by mouth daily.   Marland Kitchen losartan-hydrochlorothiazide (HYZAAR) 100-12.5 MG per tablet Take 1 tablet by mouth daily.   . montelukast (SINGULAIR) 10 MG tablet Take 10 mg by mouth at bedtime.  03/23/2014: -  . simvastatin (ZOCOR) 10 MG tablet Take 10 mg by mouth at bedtime. 01/20/2016: Received from: External Pharmacy Received Sig:   . venlafaxine XR (EFFEXOR-XR) 75 MG 24 hr capsule Take 1 capsule (75 mg total) by mouth at bedtime.   . [DISCONTINUED] Fluticasone Propionate, Inhal, 250 MCG/BLIST AEPB Inhale 1 puff into the lungs 2 (two) times daily.    No facility-administered encounter medications on file as of 03/12/2017.     Allergies: Advair diskus [fluticasone-salmeterol] and Neosporin [neomycin-polymyxin-gramicidin]   Review of Systems  Constitutional: Negative for chills, diaphoresis, fever, malaise/fatigue and weight loss.  HENT: Negative for congestion, sore throat and tinnitus.   Eyes: Negative for blurred vision, double vision and  photophobia.  Respiratory: Negative for cough and wheezing.   Cardiovascular: Negative for chest pain and palpitations.  Gastrointestinal: Negative for blood in stool, diarrhea, nausea and vomiting.  Genitourinary: Negative for dysuria, frequency and urgency.  Musculoskeletal: Negative for joint pain and myalgias.  Skin: Negative for itching and rash.  Neurological: Negative for dizziness, focal weakness, weakness and headaches.  Endo/Heme/Allergies: Negative for environmental allergies and polydipsia. Does not bruise/bleed easily.  Psychiatric/Behavioral: Negative for depression and memory loss. The patient is not nervous/anxious and does not have insomnia.      Objective:   Blood pressure 133/80, pulse 67, height 5\' 7"  (1.702 m), weight 161 lb (73 kg). Body mass index is 25.22 kg/m. General: Well Developed, well nourished, and in no acute distress.  Neuro: Alert and oriented x3, extra-ocular muscles intact, sensation grossly intact.  HEENT:[place]/AT, PERRLA, neck supple, No carotid bruits Skin: no gross rashes  Cardiac: Regular rate and rhythm Respiratory: Essentially clear to auscultation bilaterally. Not using accessory muscles, speaking in full sentences.  Abdominal: not grossly distended Musculoskeletal: Ambulates w/o diff, FROM * 4 ext.  Vasc: less 2 sec cap RF, warm and pink  Psych:  No HI/SI, judgement and insight good, Euthymic mood. Full Affect.    No results found for this or any previous visit (from the past 2160 hour(s)).

## 2017-03-12 NOTE — Patient Instructions (Addendum)
Please make a follow-up in the near future for fasting labs with [place].  And then make a follow-up office visit with me to discuss them per your preferences   Please remember to make a yearly skin screening examination and office visit with your dermatologist, esp with fam hx  Congratulations and continue the moderate intensity aerobic activity daily.  Great job.    Please realize, EXERCISE IS MEDICINE!  -  American Heart Association [facility]) guidelines for exercise : If you are in good health, without any medical conditions, you should engage in 150 minutes of moderate intensity aerobic activity per week.  This means you should be huffing and puffing throughout your workout.   Engaging in regular exercise will improve brain function and memory, as well as improve mood, boost immune system and help with weight management.  As well as the other, more well-known effects of exercise such as decreasing blood sugar levels, decreasing blood pressure,  and decreasing bad cholesterol levels/ increasing good cholesterol levels.     -  The AHA strongly endorses consumption of a diet that contains a variety of foods from all the food categories with an emphasis on fruits and vegetables; fat-free and low-fat dairy products; cereal and grain products; legumes and nuts; and fish, poultry, and/or extra lean meats.    Excessive food intake, especially of foods high in saturated and trans fats, sugar, and salt, should be avoided.    Adequate water intake of roughly 1/2 of your weight in pounds, should equal the ounces of water per day you should drink.  So for instance, if you're 200 pounds, that would be 100 ounces of water per day.        Mediterranean Diet  Why follow it? Research shows. . Those who follow the Mediterranean diet have a reduced risk of heart disease  . The diet is associated with a reduced incidence of Parkinson's and Alzheimer's diseases . People following the diet may have longer life  expectancies and lower rates of chronic diseases  . The Dietary Guidelines for Americans recommends the Mediterranean diet as an eating plan to promote health and prevent disease  What Is the Mediterranean Diet?  . Healthy eating plan based on typical foods and recipes of Mediterranean-style cooking . The diet is primarily a plant based diet; these foods should make up a majority of meals   Starches - Plant based foods should make up a majority of meals - They are an important sources of vitamins, minerals, energy, antioxidants, and fiber - Choose whole grains, foods high in fiber and minimally processed items  - Typical grain sources include wheat, oats, barley, corn, brown rice, bulgar, farro, millet, polenta, couscous  - Various types of beans include chickpeas, lentils, fava beans, black beans, white beans   Fruits  Veggies - Large quantities of antioxidant rich fruits & veggies; 6 or more servings  - Vegetables can be eaten raw or lightly drizzled with oil and cooked  - Vegetables common to the traditional Mediterranean Diet include: artichokes, arugula, beets, broccoli, brussel sprouts, cabbage, carrots, celery, collard greens, cucumbers, eggplant, kale, leeks, lemons, lettuce, mushrooms, okra, onions, peas, peppers, potatoes, pumpkin, radishes, rutabaga, shallots, spinach, sweet potatoes, turnips, zucchini - Fruits common to the Mediterranean Diet include: apples, apricots, avocados, cherries, clementines, dates, figs, grapefruits, grapes, melons, nectarines, oranges, peaches, pears, pomegranates, strawberries, tangerines  Fats - Replace butter and margarine with healthy oils, such as olive oil, canola oil, and tahini  - Limit nuts to  no more than a handful a day  - Nuts include walnuts, almonds, pecans, pistachios, pine nuts  - Limit or avoid candied, honey roasted or heavily salted nuts - Olives are central to the Mediterranean diet - can be eaten whole or used in a variety of dishes    Meats Protein - Limiting red meat: no more than a few times a month - When eating red meat: choose lean cuts and keep the portion to the size of deck of cards - Eggs: approx. 0 to 4 times a week  - Fish and lean poultry: at least 2 a week  - Healthy protein sources include, chicken, [place], lean beef, lamb - Increase intake of seafood such as tuna, salmon, trout, mackerel, shrimp, scallops - Avoid or limit high fat processed meats such as sausage and bacon  Dairy - Include moderate amounts of low fat dairy products  - Focus on healthy dairy such as fat free yogurt, skim milk, low or reduced fat cheese - Limit dairy products higher in fat such as whole or 2% milk, cheese, ice cream  Alcohol - Moderate amounts of red wine is ok  - No more than 5 oz daily for women (all ages) and men older than age 19  - No more than 10 oz of wine daily for men younger than 44  Other - Limit sweets and other desserts  - Use herbs and spices instead of salt to flavor foods  - Herbs and spices common to the traditional Mediterranean Diet include: basil, bay leaves, chives, cloves, cumin, fennel, garlic, lavender, marjoram, mint, oregano, parsley, pepper, rosemary, sage, savory, sumac, tarragon, thyme   It's not just a diet, it's a lifestyle:  . The Mediterranean diet includes lifestyle factors typical of those in the region  . Foods, drinks and meals are best eaten with others and savored . Daily physical activity is important for overall good health . This could be strenuous exercise like running and aerobics . This could also be more leisurely activities such as walking, housework, yard-work, or taking the stairs . Moderation is the key; a balanced and healthy diet accommodates most foods and drinks . Consider portion sizes and frequency of consumption of certain foods   Meal Ideas & Options:  . Breakfast:  o Whole wheat toast or whole wheat English muffins with peanut butter & hard boiled egg o Steel cut  oats topped with apples & cinnamon and skim milk  o Fresh fruit: banana, strawberries, melon, berries, peaches  o Smoothies: strawberries, bananas, greek yogurt, peanut butter o Low fat greek yogurt with blueberries and granola  o Egg white omelet with spinach and mushrooms o Breakfast couscous: whole wheat couscous, apricots, skim milk, cranberries  . Sandwiches:  o Hummus and grilled vegetables (peppers, zucchini, squash) on whole wheat bread   o Grilled chicken on whole wheat pita with lettuce, tomatoes, cucumbers or tzatziki  o Tuna salad on whole wheat bread: tuna salad made with greek yogurt, olives, red peppers, capers, green onions o Garlic rosemary lamb pita: lamb sauted with garlic, rosemary, salt & pepper; add lettuce, cucumber, greek yogurt to pita - flavor with lemon juice and black pepper  . Seafood:  o Mediterranean grilled salmon, seasoned with garlic, basil, parsley, lemon juice and black pepper o Shrimp, lemon, and spinach whole-grain pasta salad made with low fat greek yogurt  o Seared scallops with lemon orzo  o Seared tuna steaks seasoned salt, pepper, coriander topped with tomato mixture of olives,  tomatoes, olive oil, minced garlic, parsley, green onions and cappers  . Meats:  o Herbed greek chicken salad with kalamata olives, cucumber, feta  o Red bell peppers stuffed with spinach, bulgur, lean ground beef (or lentils) & topped with feta   o Kebabs: skewers of chicken, tomatoes, onions, zucchini, squash  o [place] burgers: made with red onions, mint, dill, lemon juice, feta cheese topped with roasted red peppers . Vegetarian o Cucumber salad: cucumbers, artichoke hearts, celery, red onion, feta cheese, tossed in olive oil & lemon juice  o Hummus and whole grain pita points with a greek salad (lettuce, tomato, feta, olives, cucumbers, red onion) o Lentil soup with celery, carrots made with vegetable broth, garlic, salt and pepper  o Tabouli salad: parsley, bulgur,  mint, scallions, cucumbers, tomato, radishes, lemon juice, olive oil, salt and pepper.

## 2017-03-26 ENCOUNTER — Ambulatory Visit: Payer: Self-pay | Admitting: Family Medicine

## 2017-03-29 ENCOUNTER — Encounter: Payer: Self-pay | Admitting: Pulmonary Disease

## 2017-03-29 ENCOUNTER — Ambulatory Visit (INDEPENDENT_AMBULATORY_CARE_PROVIDER_SITE_OTHER): Payer: PPO | Admitting: Pulmonary Disease

## 2017-03-29 ENCOUNTER — Other Ambulatory Visit: Payer: Self-pay

## 2017-03-29 DIAGNOSIS — J45991 Cough variant asthma: Secondary | ICD-10-CM

## 2017-03-29 MED ORDER — BECLOMETHASONE DIPROPIONATE 80 MCG/ACT IN AERS: 1.0000 | INHALATION_SPRAY | Freq: Two times a day (BID) | RESPIRATORY_TRACT | 11 refills | Status: DC

## 2017-03-29 MED ORDER — ALBUTEROL SULFATE HFA 108 (90 BASE) MCG/ACT IN AERS: 1.0000 | INHALATION_SPRAY | Freq: Four times a day (QID) | RESPIRATORY_TRACT | 11 refills | Status: DC | PRN

## 2017-03-29 NOTE — Patient Instructions (Signed)
Continue Singulair, Zyrtec, and Flonase Resume Qvar 1 puff twice a day through October If you're doing well in October then you can stop taking the medicine through March Get a flu shot in the fall I will see you back in March

## 2017-03-29 NOTE — Progress Notes (Signed)
Subjective:    Patient ID: Lindsey Price, female    DOB: 1946/01/11, 71 y.o.   MRN: [id number]  Synopsis: Cough variant asthma and allergic rhinitis.  HPI Chief Complaint  Patient presents with  . Follow-up    pt states she is doing well at this time, does note some PND and hoarseness.     Lindsey Price says that she stopped taking the QVar because she thought she was doing well.  She says that in June she used some Chlorox which gave her a tickle in her throat and a cough and dyspnea.  This was associated with post nasal drip. She resumed QVar which helped.  She says that in the last year she did well until she would have a cold, then she would have a lot of cough.  She didn't have a problem with the QVar.   She continues to have some sinus drainage.  She is taking zyrtec and flonase regulalry.  She takes nexium.   Past Medical History:  Diagnosis Date  . Arthralgia 09/11/2011  . Asthma   . Breast cancer ([place])   . 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 Hyperlipidemia   . Hypertension   . Insomnia   . Thyroid disease    hypothyroidism      Review of Systems     Objective:   Physical Exam Vitals:   03/29/17 1528  BP: 124/66  Pulse: 74  SpO2: 96%  Weight: 156 lb 12.8 oz (71.1 kg)  Height: 5\' 7"  (1.702 m)   RA  Gen: well appearing HENT: OP clear, TM's clear, neck supple PULM: CTA B, normal percussion CV: RRR, no mgr, trace edema GI: BS+, soft, nontender Derm: no cyanosis or rash Psyche: normal mood and affect       Assessment & Plan:  Cough variant asthma She has cough variant asthma which is always worse in the spring through the fall. Unfortunately she stopped taking her Qvar this year which contributed to that. She is compliant with her allergic rhinitis regimen.  Plan: Continue Singulair, Zyrtec, and Flonase Resume Qvar 1 puff twice a day through October If you're doing well in October then you can stop taking the medicine through March Get  a flu shot in the fall I will see you back in March    Current Outpatient Prescriptions:  .  albuterol (PROVENTIL HFA;VENTOLIN HFA) 108 (90 BASE) MCG/ACT inhaler, Inhale 1 puff into the lungs every 6 (six) hours as needed for wheezing or shortness of breath., Disp: , Rfl:  .  beclomethasone (QVAR) 80 MCG/ACT inhaler, Inhale into the lungs 2 (two) times daily., Disp: , Rfl:  .  cetirizine (ZYRTEC) 10 MG tablet, Take 10 mg by mouth daily., Disp: , Rfl:  .  esomeprazole (NEXIUM) 20 MG capsule, Take 20 mg by mouth daily. , Disp: , Rfl:  .  ezetimibe (ZETIA) 10 MG tablet, Take 1 tablet (10 mg total) by mouth daily., Disp: , Rfl:  .  fluticasone (FLONASE) 50 MCG/ACT nasal spray, Place 1 spray into both nostrils daily., Disp: , Rfl:  .  levothyroxine (SYNTHROID, LEVOTHROID) 100 MCG tablet, Take 100 mcg by mouth daily., Disp: , Rfl:  .  losartan-hydrochlorothiazide (HYZAAR) 100-12.5 MG per tablet, Take 1 tablet by mouth daily., Disp: , Rfl:  .  montelukast (SINGULAIR) 10 MG tablet, Take 10 mg by mouth at bedtime. , Disp: , Rfl:  .  simvastatin (ZOCOR) 10 MG tablet, Take 10 mg by mouth at  bedtime., Disp: , Rfl:  .  venlafaxine XR (EFFEXOR-XR) 75 MG 24 hr capsule, Take 1 capsule (75 mg total) by mouth at bedtime., Disp: 90 capsule, Rfl: 3

## 2017-03-29 NOTE — Assessment & Plan Note (Addendum)
She has cough variant asthma which is always worse in the spring through the fall. Unfortunately she stopped taking her Qvar this year which contributed to that. She is compliant with her allergic rhinitis regimen.  Plan: Continue Singulair, Zyrtec, and Flonase Resume Qvar 1 puff twice a day through October If you're doing well in October then you can stop taking the medicine through March Get a flu shot in the fall I will see you back in March

## 2017-03-30 ENCOUNTER — Ambulatory Visit: Payer: Self-pay | Admitting: Pulmonary Disease

## 2017-04-03 ENCOUNTER — Other Ambulatory Visit: Payer: Self-pay

## 2017-04-03 MED ORDER — BECLOMETHASONE DIPROP HFA 80 MCG/ACT IN AERB: 2.0000 | INHALATION_SPRAY | Freq: Two times a day (BID) | RESPIRATORY_TRACT | 11 refills | Status: DC

## 2017-04-03 MED ORDER — FLUTICASONE PROPIONATE 50 MCG/ACT NA SUSP: 1.0000 | Freq: Every day | NASAL | 11 refills | Status: DC

## 2017-04-05 DIAGNOSIS — H59031 Cystoid macular edema following cataract surgery, right eye: Secondary | ICD-10-CM | POA: Diagnosis not present

## 2017-04-06 ENCOUNTER — Telehealth: Payer: Self-pay | Admitting: Pulmonary Disease

## 2017-04-06 NOTE — Telephone Encounter (Signed)
I have attempted to call the insurance company and it keeps hanging up on me.  The number is [phone]  PT ID #  [id number]  Will try and call back later.

## 2017-04-09 ENCOUNTER — Other Ambulatory Visit: Payer: PPO

## 2017-04-09 DIAGNOSIS — E782 Mixed hyperlipidemia: Secondary | ICD-10-CM | POA: Diagnosis not present

## 2017-04-09 DIAGNOSIS — F4320 Adjustment disorder, unspecified: Secondary | ICD-10-CM | POA: Diagnosis not present

## 2017-04-09 DIAGNOSIS — E039 Hypothyroidism, unspecified: Secondary | ICD-10-CM | POA: Diagnosis not present

## 2017-04-09 DIAGNOSIS — I10 Essential (primary) hypertension: Secondary | ICD-10-CM

## 2017-04-09 DIAGNOSIS — J3089 Other allergic rhinitis: Secondary | ICD-10-CM | POA: Diagnosis not present

## 2017-04-09 DIAGNOSIS — D0591 Unspecified type of carcinoma in situ of right breast: Secondary | ICD-10-CM

## 2017-04-09 DIAGNOSIS — Z809 Family history of malignant neoplasm, unspecified: Secondary | ICD-10-CM | POA: Diagnosis not present

## 2017-04-10 LAB — CBC WITH DIFFERENTIAL/PLATELET
Basophils Absolute: 0 10*3/uL (ref 0.0–0.2)
Basos: 0 %
EOS (ABSOLUTE): 0.2 10*3/uL (ref 0.0–0.4)
Eos: 5 %
Hematocrit: 37.1 % (ref 34.0–46.6)
Hemoglobin: 12.4 g/dL (ref 11.1–15.9)
Immature Grans (Abs): 0 10*3/uL (ref 0.0–0.1)
Immature Granulocytes: 0 %
Lymphocytes Absolute: 1.2 10*3/uL (ref 0.7–3.1)
Lymphs: 33 %
MCH: 31.6 pg (ref 26.6–33.0)
MCHC: 33.4 g/dL (ref 31.5–35.7)
MCV: 94 fL (ref 79–97)
Monocytes Absolute: 0.6 10*3/uL (ref 0.1–0.9)
Monocytes: 16 %
Neutrophils Absolute: 1.7 10*3/uL (ref 1.4–7.0)
Neutrophils: 46 %
Platelets: 261 10*3/uL (ref 150–379)
RBC: 3.93 x10E6/uL (ref 3.77–5.28)
RDW: 12.7 % (ref 12.3–15.4)
WBC: 3.8 10*3/uL (ref 3.4–10.8)

## 2017-04-10 LAB — COMPREHENSIVE METABOLIC PANEL
ALT: 33 IU/L — ABNORMAL HIGH (ref 0–32)
AST: 30 IU/L (ref 0–40)
Albumin/Globulin Ratio: 1.7 (ref 1.2–2.2)
Albumin: 3.9 g/dL (ref 3.5–4.8)
Alkaline Phosphatase: 86 IU/L (ref 39–117)
BUN/Creatinine Ratio: 23 (ref 12–28)
BUN: 19 mg/dL (ref 8–27)
Bilirubin Total: 0.4 mg/dL (ref 0.0–1.2)
CO2: 24 mmol/L (ref 20–29)
Calcium: 9.2 mg/dL (ref 8.7–10.3)
Chloride: 103 mmol/L (ref 96–106)
Creatinine, Ser: 0.82 mg/dL (ref 0.57–1.00)
GFR calc Af Amer: 83 mL/min/{1.73_m2} (ref >59)
GFR calc non Af Amer: 72 mL/min/{1.73_m2} (ref >59)
Globulin, Total: 2.3 g/dL (ref 1.5–4.5)
Glucose: 92 mg/dL (ref 65–99)
Potassium: 3.8 mmol/L (ref 3.5–5.2)
Sodium: 143 mmol/L (ref 134–144)
Total Protein: 6.2 g/dL (ref 6.0–8.5)

## 2017-04-10 LAB — LIPID PANEL
Chol/HDL Ratio: 3.2 ratio (ref 0.0–4.4)
Cholesterol, Total: 146 mg/dL (ref 100–199)
HDL: 45 mg/dL (ref >39)
LDL Calculated: 83 mg/dL (ref 0–99)
Triglycerides: 88 mg/dL (ref 0–149)
VLDL Cholesterol Cal: 18 mg/dL (ref 5–40)

## 2017-04-10 LAB — T4, FREE: Free T4: 1.52 ng/dL (ref 0.82–1.77)

## 2017-04-10 LAB — HEMOGLOBIN A1C
Est. average glucose Bld gHb Est-mCnc: 114 mg/dL
Hgb A1c MFr Bld: 5.6 % (ref 4.8–5.6)

## 2017-04-10 LAB — VITAMIN D 25 HYDROXY (VIT D DEFICIENCY, FRACTURES): Vit D, 25-Hydroxy: 15.3 ng/mL — ABNORMAL LOW (ref 30.0–100.0)

## 2017-04-10 LAB — TSH: TSH: 0.373 u[IU]/mL — ABNORMAL LOW (ref 0.450–4.500)

## 2017-04-10 NOTE — Telephone Encounter (Signed)
[employer], [id number], Option 3, 1 Initiated PA for Qvar Redihaler 46mcg Approval will be determined within the next 2-3 business days.  Will hold in triage to follow up on determination this week.

## 2017-04-12 ENCOUNTER — Ambulatory Visit (INDEPENDENT_AMBULATORY_CARE_PROVIDER_SITE_OTHER): Payer: PPO | Admitting: Family Medicine

## 2017-04-12 ENCOUNTER — Encounter: Payer: Self-pay | Admitting: Family Medicine

## 2017-04-12 VITALS — BP 116/71 | HR 76 | Ht 67.0 in | Wt 157.0 lb

## 2017-04-12 DIAGNOSIS — F4320 Adjustment disorder, unspecified: Secondary | ICD-10-CM | POA: Diagnosis not present

## 2017-04-12 DIAGNOSIS — M8580 Other specified disorders of bone density and structure, unspecified site: Secondary | ICD-10-CM

## 2017-04-12 DIAGNOSIS — E039 Hypothyroidism, unspecified: Secondary | ICD-10-CM

## 2017-04-12 DIAGNOSIS — Z13820 Encounter for screening for osteoporosis: Secondary | ICD-10-CM

## 2017-04-12 DIAGNOSIS — E559 Vitamin D deficiency, unspecified: Secondary | ICD-10-CM

## 2017-04-12 DIAGNOSIS — E782 Mixed hyperlipidemia: Secondary | ICD-10-CM

## 2017-04-12 DIAGNOSIS — I10 Essential (primary) hypertension: Secondary | ICD-10-CM | POA: Diagnosis not present

## 2017-04-12 MED ORDER — LEVOTHYROXINE SODIUM 88 MCG PO TABS: ORAL_TABLET | ORAL | 3 refills | Status: DC

## 2017-04-12 MED ORDER — LEVOTHYROXINE SODIUM 100 MCG PO TABS: ORAL_TABLET | ORAL | 3 refills | Status: DC

## 2017-04-12 MED ORDER — VITAMIN D (ERGOCALCIFEROL) 1.25 MG (50000 UNIT) PO CAPS: ORAL_CAPSULE | ORAL | 10 refills | Status: DC

## 2017-04-12 MED ORDER — VITAMIN D3 125 MCG (5000 UT) PO TABS: ORAL_TABLET | ORAL | 3 refills | Status: DC

## 2017-04-12 NOTE — Patient Instructions (Addendum)
Since we changed the dose of your Synthroid, and in 6-8 weeks so we can recheck her thyroid levels.  Please remember to get your vitamin D over-the-counter tablets of 5000 IUs daily.  In addition take a once weekly dose of your prescription vitamin D.  Also remember to try to drink more water.  Especially in the heat you should be drinking one half of your weight in ounces of water per day.  That's the minimum you should drink so when you're exercising he should bring even more.  Please sign papers so we can get your records from the GI doc.   -

## 2017-04-12 NOTE — Telephone Encounter (Signed)
Pt's Qvar PA has been approved through 09/03/2017.  Pt aware .  Nothing further needed.

## 2017-04-12 NOTE — Progress Notes (Signed)
Assessment and plan:  1. Vitamin D deficiency   2. Acquired hypothyroidism   3. Osteoporosis screening   4. Osteopenia, unspecified location   5. Essential hypertension   6. Hyperlipemia, mixed   7. Adjustment disorder, unspecified type    Since we changed the dose of your Synthroid, and in 6-8 weeks so we can recheck her thyroid levels.  Please remember to get your vitamin D over-the-counter tablets of 5000 IUs daily.  In addition take a once weekly dose of your prescription vitamin D.  Also remember to try to drink more water.  Especially in the heat you should be drinking one half of your weight in ounces of water per day.  That's the minimum you should drink so when you're exercising he should bring even more.  Please sign papers so we can get your records from the GI doc  HTN (hypertension) BP at goal continue current medications. Diet lifestyle modifications discussed  Hyperlipemia, mixed ALT only very slightly elevated but not worrisome. - Continue statin - Drink adequate amounts of water - We'll continue to monitor regular  Adjustment disorder Mood stable, continue medicines  Hypothyroidism Will decrease dose of levothyroxine.  Patient aware of this change.  - Recheck TSH in 6-8 weeks   Vitamin D deficiency Deficiency noted  - Importance of adequate vitamin D and calcium reviewed with patient.  - Sent referral for bone density     Meds ordered this encounter  Medications  . Vitamin D, Ergocalciferol, (DRISDOL) 50000 units CAPS capsule    Sig: Take one tablet wkly    Dispense:  12 capsule    Refill:  10  . Cholecalciferol (VITAMIN D3) 5000 units TABS    Sig: 5,000 IU OTC vitamin D3 daily.    Dispense:  90 tablet    Refill:  3  . levothyroxine (SYNTHROID, LEVOTHROID) 100 MCG tablet    Sig: 1 tablet every other day alternating with 88 mcg tablet    Dispense:  45 tablet    Refill:  3    . levothyroxine (SYNTHROID, LEVOTHROID) 88 MCG tablet    Sig: 1 tablet every other day alternating with 100 mcg tabs    Dispense:  45 tablet    Refill:  3    Discontinued Medications   No medications on file    Modified Medications   Modified Medication Previous Medication   LEVOTHYROXINE (SYNTHROID, LEVOTHROID) 100 MCG TABLET levothyroxine (SYNTHROID, LEVOTHROID) 100 MCG tablet      1 tablet every other day alternating with 88 mcg tablet    Take 100 mcg by mouth daily.     Orders Placed This Encounter  Procedures  . DG Bone Density     Return in about 3 months (around 07/13/2017) for Medicare wellness visit & immunization update near future.  Anticipatory guidance and routine counseling done re: condition, txmnt options and need for follow up. All questions of patient's were answered.   Gross side effects, risk and benefits, and alternatives of medications discussed with patient.  Patient is aware that all medications have potential side effects and we are unable to predict every sideeffect or drug-drug interaction that may occur.  Expresses verbal understanding and consents to current therapy plan and treatment regiment.  Please see AVS handed out to patient at the end of our visit for additional patient instructions/ counseling done pertaining to today's office visit.  Note: This document was prepared using Systems analyst and may include  unintentional dictation errors.   ----------------------------------------------------------------------------------------------------------------------  Subjective:   CC:   Lindsey Price is a 71 y.o. female who presents to [place] at [facility] today for review and discussion of recent bloodwork that was done.  1. All recent blood work that we ordered was reviewed with patient today.  Patient was counseled on all abnormalities and we discussed dietary and lifestyle changes that could help those  values (also medications when appropriate).  Extensive health counseling performed and all patient's concerns/ questions were addressed.  2. Patient doing 1 hour on the elliptical daily.  She does this almost every single day. 3. Patient had her first set of hepatitis B approximately 3-4 weeks prior to leaving on Thanksgiving day 2017.  She never got her other vaccine.    Wt Readings from Last 3 Encounters:  04/12/17 157 lb (71.2 kg)  03/29/17 156 lb 12.8 oz (71.1 kg)  03/12/17 161 lb (73 kg)   BP Readings from Last 3 Encounters:  05/16/17 124/78  04/12/17 116/71  03/29/17 124/66   Pulse Readings from Last 3 Encounters:  05/16/17 87  04/12/17 76  03/29/17 74   BMI Readings from Last 3 Encounters:  04/12/17 24.59 kg/m  03/29/17 24.56 kg/m  03/12/17 25.22 kg/m     Patient Care Team    Relationship Specialty Notifications Start End  Mellody Dance, DO PCP - General Family Medicine  03/12/17   Juanito Doom, MD Consulting Physician Pulmonary Disease  03/12/17   Elsie Saas, MD Consulting Physician Orthopedic Surgery  03/12/17    Comment: L rot cuff repair  Allyn Kenner, MD Consulting Physician Dermatology  03/12/17    Comment: sister with sebacious cell CA  Katy Apo, MD Consulting Physician Ophthalmology  03/12/17   Jovita Kussmaul, MD Consulting Physician General Surgery  03/12/17   Nicholas Lose, MD Consulting Physician Hematology and Oncology  03/12/17   Garlan Fair, MD Consulting Physician Gastroenterology  04/12/17     Full medical history updated and reviewed in the office today  Patient Active Problem List   Diagnosis Date Noted  . HTN (hypertension) 03/12/2017    Priority: High  . Hyperlipemia, mixed 03/12/2017    Priority: High  . Hypothyroidism 03/12/2017    Priority: Medium  . Adjustment disorder 03/12/2017    Priority: Medium  . GERD (gastroesophageal reflux disease) 03/12/2017    Priority: Medium  . Cough variant asthma 02/23/2015    Priority:  Medium  . Cancer of right breast, stage 0 09/11/2011    Priority: Medium  . Vitamin D deficiency 04/12/2017    Priority: Low  . Environmental and seasonal allergies 03/12/2017    Priority: Low  . Family history of multiple cancers 03/12/2017    Priority: Low  . History of tobacco abuse 03/12/2017    Priority: Low  . Insomnia 09/11/2011    Priority: Low  . Osteopenia 04/12/2017  . Cataracts, bilateral- s/p surgery. 03/12/2017  . Allergic rhinitis 03/23/2015  . Arthralgia 09/11/2011    Past Medical History:  Diagnosis Date  . Arthralgia 09/11/2011  . Asthma   . Breast cancer ([place])   . 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 Hyperlipidemia   . Hypertension   . Insomnia   . Thyroid disease    hypothyroidism    Past Surgical History:  Procedure Laterality Date  . ABDOMINAL HYSTERECTOMY  1975   partial  . BREAST LUMPECTOMY  2011   right breast  .  COLONOSCOPY WITH PROPOFOL N/A 04/14/2014   Procedure: COLONOSCOPY WITH PROPOFOL;  Surgeon: Garlan Fair, MD;  Location: [facility];  Service: Endoscopy;  Laterality: N/A;  . ROTATOR CUFF REPAIR Left 04/2013   Dr. Para March    Social History  Substance Use Topics  . Smoking status: Former Smoker    Packs/day: 1.00    Years: 15.00    Types: Cigarettes    Quit date: 12/09/1978  . Smokeless tobacco: Former Systems developer    Quit date: 09/04/1978  . Alcohol use 0.0 oz/week     Comment: SOCIALLY wine    Family Hx: Family History  Problem Relation Age of Onset  . Cancer Mother        BREAST  . Cancer Father        pancreatic  . Cancer Sister        sebacous cell carcinoma  . Cancer Maternal Aunt        lung  . Cancer Paternal Uncle        colon, stomach     Medications: Current Outpatient Prescriptions  Medication Sig Dispense Refill  . albuterol (PROVENTIL HFA;VENTOLIN HFA) 108 (90 Base) MCG/ACT inhaler Inhale 1 puff into the lungs every 6 (six) hours as needed for wheezing or shortness of breath. 18 g 11  .  beclomethasone (QVAR REDIHALER) 80 MCG/ACT inhaler Inhale 2 puffs into the lungs 2 (two) times daily. 1 Inhaler 11  . cetirizine (ZYRTEC) 10 MG tablet Take 10 mg by mouth daily.    Marland Kitchen esomeprazole (NEXIUM) 20 MG capsule Take 20 mg by mouth daily.     Marland Kitchen ezetimibe (ZETIA) 10 MG tablet Take 1 tablet (10 mg total) by mouth daily.    . fluticasone (FLONASE) 50 MCG/ACT nasal spray Place 1 spray into both nostrils daily. 16 g 11  . levothyroxine (SYNTHROID, LEVOTHROID) 100 MCG tablet 1 tablet every other day alternating with 88 mcg tablet 45 tablet 3  . losartan-hydrochlorothiazide (HYZAAR) 100-12.5 MG per tablet Take 1 tablet by mouth daily.    . montelukast (SINGULAIR) 10 MG tablet Take 10 mg by mouth at bedtime.     . simvastatin (ZOCOR) 10 MG tablet Take 10 mg by mouth at bedtime.    Marland Kitchen venlafaxine XR (EFFEXOR-XR) 75 MG 24 hr capsule Take 1 capsule (75 mg total) by mouth at bedtime. 90 capsule 3  . Cholecalciferol (VITAMIN D3) 5000 units TABS 5,000 IU OTC vitamin D3 daily. 90 tablet 3  . levothyroxine (SYNTHROID, LEVOTHROID) 88 MCG tablet 1 tablet every other day alternating with 100 mcg tabs 45 tablet 3  . predniSONE (DELTASONE) 10 MG tablet Take 4 tabs for 2 days, then 3 tabs for 2 days, 2 tabs for 2 days, then 1 tab for 2 days, then stop. 20 tablet 0  . Vitamin D, Ergocalciferol, (DRISDOL) 50000 units CAPS capsule Take one tablet wkly 12 capsule 10   No current facility-administered medications for this visit.     Allergies:  Allergies  Allergen Reactions  . Advair Diskus [Fluticasone-Salmeterol]     Hoarseness.  . Neosporin [Neomycin-Polymyxin-Gramicidin] Itching     Review of Systems: General:   No F/C, wt loss Pulm:   No DIB, SOB, pleuritic chest pain Card:  No CP, palpitations Abd:  No n/v/d or pain Ext:  No inc edema from baseline  Objective:  Blood pressure 116/71, pulse 76, height 5\' 7"  (1.702 m), weight 157 lb (71.2 kg). Body mass index is 24.59 kg/m. Gen:   Well NAD, A  and O *3 HEENT:    [place]/AT, EOMI,  MMM Lungs:   Normal work of breathing. CTA B/L, no Wh, rhonchi Heart:   RRR, S1, S2 WNL's, no MRG Abd:   No gross distention Exts:    warm, pink,  Brisk capillary refill, warm and well perfused.  Psych:    No HI/SI, judgement and insight good, Euthymic mood. Full Affect.   Recent Results (from the past 2160 hour(s))  CBC with Differential/Platelet     Status: None   Collection Time: 04/09/17  9:38 AM  Result Value Ref Range   WBC 3.8 3.4 - 10.8 x10E3/uL   RBC 3.93 3.77 - 5.28 x10E6/uL   Hemoglobin 12.4 11.1 - 15.9 g/dL   Hematocrit 37.1 34.0 - 46.6 %   MCV 94 79 - 97 fL   MCH 31.6 26.6 - 33.0 pg   MCHC 33.4 31.5 - 35.7 g/dL   RDW 12.7 12.3 - 15.4 %   Platelets 261 150 - 379 x10E3/uL   Neutrophils 46 Not Estab. %   Lymphs 33 Not Estab. %   Monocytes 16 Not Estab. %   Eos 5 Not Estab. %   Basos 0 Not Estab. %   Neutrophils Absolute 1.7 1.4 - 7.0 x10E3/uL   Lymphocytes Absolute 1.2 0.7 - 3.1 x10E3/uL   Monocytes Absolute 0.6 0.1 - 0.9 x10E3/uL   EOS (ABSOLUTE) 0.2 0.0 - 0.4 x10E3/uL   Basophils Absolute 0.0 0.0 - 0.2 x10E3/uL   Immature Granulocytes 0 Not Estab. %   Immature Grans (Abs) 0.0 0.0 - 0.1 x10E3/uL  Comprehensive metabolic panel     Status: Abnormal   Collection Time: 04/09/17  9:38 AM  Result Value Ref Range   Glucose 92 65 - 99 mg/dL   BUN 19 8 - 27 mg/dL   Creatinine, Ser 0.82 0.57 - 1.00 mg/dL   GFR calc non Af Amer 72 >59 mL/min/1.73   GFR calc Af Amer 83 >59 mL/min/1.73   BUN/Creatinine Ratio 23 12 - 28   Sodium 143 134 - 144 mmol/L   Potassium 3.8 3.5 - 5.2 mmol/L   Chloride 103 96 - 106 mmol/L   CO2 24 20 - 29 mmol/L   Calcium 9.2 8.7 - 10.3 mg/dL   Total Protein 6.2 6.0 - 8.5 g/dL   Albumin 3.9 3.5 - 4.8 g/dL   Globulin, Total 2.3 1.5 - 4.5 g/dL   Albumin/Globulin Ratio 1.7 1.2 - 2.2   Bilirubin Total 0.4 0.0 - 1.2 mg/dL   Alkaline Phosphatase 86 39 - 117 IU/L   AST 30 0 - 40 IU/L   ALT 33 (H) 0 - 32 IU/L    Hemoglobin A1c     Status: None   Collection Time: 04/09/17  9:38 AM  Result Value Ref Range   Hgb A1c MFr Bld 5.6 4.8 - 5.6 %    Comment:          Pre-diabetes: 5.7 - 6.4          Diabetes: >6.4          Glycemic control for adults with diabetes: <7.0    Est. average glucose Bld gHb Est-mCnc 114 mg/dL  Lipid panel     Status: None   Collection Time: 04/09/17  9:38 AM  Result Value Ref Range   Cholesterol, Total 146 100 - 199 mg/dL   Triglycerides 88 0 - 149 mg/dL   HDL 45 >39 mg/dL   VLDL Cholesterol Cal 18 5 - 40 mg/dL  LDL Calculated 83 0 - 99 mg/dL   Chol/HDL Ratio 3.2 0.0 - 4.4 ratio    Comment:                                   T. Chol/HDL Ratio                                             Men  Women                               1/2 Avg.Risk  3.4    3.3                                   Avg.Risk  5.0    4.4                                2X Avg.Risk  9.6    7.1                                3X Avg.Risk 23.4   11.0   TSH     Status: Abnormal   Collection Time: 04/09/17  9:38 AM  Result Value Ref Range   TSH 0.373 (L) 0.450 - 4.500 uIU/mL  T4, free     Status: None   Collection Time: 04/09/17  9:38 AM  Result Value Ref Range   Free T4 1.52 0.82 - 1.77 ng/dL  VITAMIN D 25 Hydroxy (Vit-D Deficiency, Fractures)     Status: Abnormal   Collection Time: 04/09/17  9:38 AM  Result Value Ref Range   Vit D, 25-Hydroxy 15.3 (L) 30.0 - 100.0 ng/mL    Comment: Vitamin D deficiency has been defined by the [place] and an Endocrine Society practice guideline as a level of serum 25-OH vitamin D less than 20 ng/mL (1,2). The Endocrine Society went on to further define vitamin D insufficiency as a level between 21 and 29 ng/mL (2). 1. IOM (Institute of Medicine). 2010. Dietary reference    intakes for calcium and D. [place]: The    [employer]. 2. Holick MF, Binkley Sharpes, Bischoff-Ferrari HA, et al.    Evaluation, treatment, and prevention of vitamin  D    deficiency: an Endocrine Society clinical practice    guideline. JCEM. 2011 Jul; 96(7):1911-30.   Nitric oxide     Status: None   Collection Time: 05/16/17  3:35 PM  Result Value Ref Range   Nitric Oxide 96

## 2017-05-11 ENCOUNTER — Other Ambulatory Visit: Payer: Self-pay

## 2017-05-16 ENCOUNTER — Encounter: Payer: Self-pay | Admitting: Acute Care

## 2017-05-16 ENCOUNTER — Ambulatory Visit (INDEPENDENT_AMBULATORY_CARE_PROVIDER_SITE_OTHER): Payer: PPO | Admitting: Acute Care

## 2017-05-16 VITALS — BP 124/78 | HR 87

## 2017-05-16 DIAGNOSIS — J45991 Cough variant asthma: Secondary | ICD-10-CM

## 2017-05-16 DIAGNOSIS — R059 Cough, unspecified: Secondary | ICD-10-CM

## 2017-05-16 DIAGNOSIS — R05 Cough: Secondary | ICD-10-CM

## 2017-05-16 LAB — NITRIC OXIDE: Nitric Oxide: 96

## 2017-05-16 MED ORDER — PREDNISONE 10 MG PO TABS: ORAL_TABLET | ORAL | 0 refills | Status: DC

## 2017-05-16 NOTE — Assessment & Plan Note (Signed)
Mild flare Plan We will do a FENO today.  Your score is elevated. Continue using your Singulair, Zyrtec, Flonase and QVar as you have been doing. Continue Nexium as you have been doing. Add Pepcid 20 mg at bedtime.( Generic is ok) Prednisone taper; 10 mg tablets: 4 tablets x 2 days, 3 tabs x 2 days, 2 tabs x 2 days, 2, 1 tab x 2 days then stop. Sips of water instead of throat clearing Throat soothing with sugar free jolly ranchers, lemon heads or werthers. Avoid mint, menthol or chocolate. GERD diet Follow up with Carly Sabo or Dr. Lake Bells in 2-3 weeks. Consider spirometry or  PFTs at follow-up if better Please contact office for sooner follow up if symptoms do not improve or worsen or seek emergency care

## 2017-05-16 NOTE — Patient Instructions (Addendum)
It is nice to meet you today. We will do a FENO today.  Your score is elevated. Continue using your Singulair, Zyrtec, Flonase and QVar as you have been doing. Continue Nexium as you have been doing. Add Pepcid 20 mg at bedtime.( Generic is ok) Prednisone taper; 10 mg tablets: 4 tablets x 2 days, 3 tabs x 2 days, 2 tabs x 2 days, 2, 1 tab x 2 days then stop. Sips of water instead of throat clearing Throat soothing with sugar free jolly ranchers, lemon heads or werthers. Avoid mint, menthol or chocolate. GERD diet Follow up with Sarah or Dr. Lake Bells in 2-3 weeks. Please contact office for sooner follow up if symptoms do not improve or worsen or seek emergency care

## 2017-05-16 NOTE — Progress Notes (Signed)
History of Present Illness Lindsey Price is a 71 y.o. 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 She is followed by Dr. Lake Price.   05/16/2017 Acute OV: Pt returns for continued cough. She states she has had return of her cough. She has been compliant with her Singulair, Zyrtec and Flonase. She is using her Q VAR 2 puffs twice daily as she has been instructed. She has resumed her Nexium daily as she thinks this may be partially  due to her GERD. She has a cough that is sometimes productive for yellow color secretion. She is afebrile.She has post nasal drip.She denies a fever, chest pain, orthopnea, or hemoptysis.  Test Results: FENO: 96 ppb  CBC Latest Ref Rng & Units 04/09/2017 05/15/2014 11/10/2013  WBC 3.4 - 10.8 x10E3/uL 3.8 4.1 4.2  Hemoglobin 11.1 - 15.9 g/dL 12.4 14.6 14.7  Hematocrit 34.0 - 46.6 % 37.1 44.9 44.4  Platelets 150 - 379 x10E3/uL 261 218 226    BMP Latest Ref Rng & Units 04/09/2017 05/15/2014 11/10/2013  Glucose 65 - 99 mg/dL 92 101 103  BUN 8 - 27 mg/dL 19 19.1 24.7  Creatinine 0.57 - 1.00 mg/dL 0.82 0.8 0.8  BUN/Creat Ratio 12 - 28 23 - -  Sodium 134 - 144 mmol/L 143 143 142  Potassium 3.5 - 5.2 mmol/L 3.8 4.3 4.1  Chloride 96 - 106 mmol/L 103 - -  CO2 20 - 29 mmol/L 24 25 24   Calcium 8.7 - 10.3 mg/dL 9.2 9.2 9.3      Past medical hx Past Medical History:  Diagnosis Date  . Arthralgia 09/11/2011  . Asthma   . Breast cancer ([place])   . 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 Hyperlipidemia   . Hypertension   . Insomnia   . Thyroid disease    hypothyroidism     Social History  Substance Use Topics  . Smoking status: Former Smoker    Packs/day: 1.00    Years: 15.00    Types: Cigarettes    Quit date: 12/09/1978  . Smokeless tobacco: Former Systems developer    Quit date: 09/04/1978  . Alcohol use 0.0 oz/week     Comment: SOCIALLY wine    Lindsey Price reports that she quit smoking about 38 years ago. Her smoking use included Cigarettes. She has a 15.00 pack-year  smoking history. She quit smokeless tobacco use about 38 years ago. She reports that she drinks alcohol. She reports that she does not use drugs.  Tobacco Cessation: Former smoker quit 1980  Past surgical hx, Family hx, Social hx all reviewed.  Current Outpatient Prescriptions on File Prior to Visit  Medication Sig  . albuterol (PROVENTIL HFA;VENTOLIN HFA) 108 (90 Base) MCG/ACT inhaler Inhale 1 puff into the lungs every 6 (six) hours as needed for wheezing or shortness of breath.  . beclomethasone (QVAR REDIHALER) 80 MCG/ACT inhaler Inhale 2 puffs into the lungs 2 (two) times daily.  . cetirizine (ZYRTEC) 10 MG tablet Take 10 mg by mouth daily.  . Cholecalciferol (VITAMIN D3) 5000 units TABS 5,000 IU OTC vitamin D3 daily.  Marland Kitchen esomeprazole (NEXIUM) 20 MG capsule Take 20 mg by mouth daily.   Marland Kitchen ezetimibe (ZETIA) 10 MG tablet Take 1 tablet (10 mg total) by mouth daily.  . fluticasone (FLONASE) 50 MCG/ACT nasal spray Place 1 spray into both nostrils daily.  Marland Kitchen levothyroxine (SYNTHROID, LEVOTHROID) 100 MCG tablet 1 tablet every other day alternating with 88 mcg tablet  . levothyroxine (SYNTHROID, LEVOTHROID) 88 MCG tablet  1 tablet every other day alternating with 100 mcg tabs  . losartan-hydrochlorothiazide (HYZAAR) 100-12.5 MG per tablet Take 1 tablet by mouth daily.  . montelukast (SINGULAIR) 10 MG tablet Take 10 mg by mouth at bedtime.   . simvastatin (ZOCOR) 10 MG tablet Take 10 mg by mouth at bedtime.  Marland Kitchen venlafaxine XR (EFFEXOR-XR) 75 MG 24 hr capsule Take 1 capsule (75 mg total) by mouth at bedtime.  . Vitamin D, Ergocalciferol, (DRISDOL) 50000 units CAPS capsule Take one tablet wkly   No current facility-administered medications on file prior to visit.      Allergies  Allergen Reactions  . Advair Diskus [Fluticasone-Salmeterol]     Hoarseness.  . Neosporin [Neomycin-Polymyxin-Gramicidin] Itching    Review Of Systems:  Constitutional:   No  weight loss, night sweats,  Fevers,  chills, fatigue, or  lassitude.  HEENT:   No headaches,  Difficulty swallowing,  Tooth/dental problems, or  Sore throat,                No sneezing, itching, ear ache, nasal congestion, +post nasal drip,   CV:  No chest pain,  Orthopnea, PND, swelling in lower extremities, anasarca, dizziness, palpitations, syncope.   GI  No heartburn, indigestion, abdominal pain, nausea, vomiting, diarrhea, change in bowel habits, loss of appetite, bloody stools.   Resp: No shortness of breath with exertion or at rest.  No excess mucus, + productive cough,  + non-productive cough,  No coughing up of blood.  No change in color of mucus.  No wheezing.  No chest wall deformity  Skin: no rash or lesions.  GU: no dysuria, change in color of urine, no urgency or frequency.  No flank pain, no hematuria   MS:  No joint pain or swelling.  No decreased range of motion.  No back pain.  Psych:  No change in mood or affect. No depression or anxiety.  No memory loss.   Vital Signs BP 124/78 (BP Location: Left Arm, Cuff Size: Normal)   Pulse 87   SpO2 94%    Physical Exam:  General- No distress,  A&Ox3 ENT: No sinus tenderness, TM clear, pale nasal mucosa, no oral exudate,no post nasal drip, no LAN Cardiac: S1, S2, regular rate and rhythm, no murmur Chest: No wheeze/ rales/ dullness; no accessory muscle use, no nasal flaring, no sternal retractions Abd.: Soft Non-tender Ext: No clubbing cyanosis, edema Neuro:  normal strength Skin: No rashes, warm and dry Psych: normal mood and behavior   Assessment/Plan  Cough variant asthma Mild flare Plan We will do a FENO today.  Your score is elevated. Continue using your Singulair, Zyrtec, Flonase and QVar as you have been doing. Continue Nexium as you have been doing. Add Pepcid 20 mg at bedtime.( Generic is ok) Prednisone taper; 10 mg tablets: 4 tablets x 2 days, 3 tabs x 2 days, 2 tabs x 2 days, 2, 1 tab x 2 days then stop. Sips of water instead of throat  clearing Throat soothing with sugar free jolly ranchers, lemon heads or werthers. Avoid mint, menthol or chocolate. GERD diet Follow up with Lindsey Price or Dr. Lake Price in 2-3 weeks. Consider spirometry or  PFTs at follow-up if better Please contact office for sooner follow up if symptoms do not improve or worsen or seek emergency care      Magdalen Spatz, NP 05/16/2017  5:12 PM

## 2017-05-17 ENCOUNTER — Ambulatory Visit: Payer: Self-pay | Admitting: Family Medicine

## 2017-05-19 NOTE — Progress Notes (Signed)
Reviewed, agree 

## 2017-05-21 ENCOUNTER — Other Ambulatory Visit: Payer: Self-pay

## 2017-05-24 ENCOUNTER — Other Ambulatory Visit: Payer: Self-pay | Admitting: Family Medicine

## 2017-05-24 NOTE — Assessment & Plan Note (Signed)
Will decrease dose of levothyroxine.  Patient aware of this change.  - Recheck TSH in 6-8 weeks

## 2017-05-24 NOTE — Assessment & Plan Note (Signed)
ALT only very slightly elevated but not worrisome. - Continue statin - Drink adequate amounts of water - We'll continue to monitor regular

## 2017-05-24 NOTE — Assessment & Plan Note (Signed)
Deficiency noted  - Importance of adequate vitamin D and calcium reviewed with patient.  - Sent referral for bone density

## 2017-05-24 NOTE — Assessment & Plan Note (Signed)
BP at goal continue current medications. Diet lifestyle modifications discussed

## 2017-05-24 NOTE — Assessment & Plan Note (Signed)
Mood stable, continue medicines

## 2017-05-28 ENCOUNTER — Encounter: Payer: Self-pay | Admitting: Acute Care

## 2017-05-28 ENCOUNTER — Ambulatory Visit (INDEPENDENT_AMBULATORY_CARE_PROVIDER_SITE_OTHER): Payer: PPO | Admitting: Acute Care

## 2017-05-28 VITALS — BP 130/70 | HR 67 | Ht 67.0 in | Wt 159.0 lb

## 2017-05-28 DIAGNOSIS — J45991 Cough variant asthma: Secondary | ICD-10-CM | POA: Diagnosis not present

## 2017-05-28 DIAGNOSIS — J45909 Unspecified asthma, uncomplicated: Secondary | ICD-10-CM | POA: Diagnosis not present

## 2017-05-28 MED ORDER — FAMOTIDINE 20 MG PO TABS: 20.0000 mg | ORAL_TABLET | Freq: Two times a day (BID) | ORAL | 0 refills | Status: DC

## 2017-05-28 NOTE — Progress Notes (Signed)
Reviewed, agree 

## 2017-05-28 NOTE — Progress Notes (Signed)
History of Present Illness Lindsey Price is a 71 y.o. 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 She is followed by Dr. Lake Bells.   05/28/2017 Follow Up for asthma flare: Pt. Was seen for mild asthma flare on  05/16/2017 with the following plan:  [place]  Your score is elevated.( 96 ppb) Continue using your Singulair, Zyrtec, Flonase and QVar as you have been doing. Continue Nexium as you have been doing. Add Pepcid 20 mg at bedtime.( Generic is ok) Prednisone taper; 10 mg tablets: 4 tablets x 2 days, 3 tabs x 2 days, 2 tabs x 2 days, 2, 1 tab x 2 days then stop. Sips of water instead of throat clearing Throat soothing with sugar free jolly ranchers, lemon heads or werthers. Avoid mint, menthol or chocolate. GERD diet Follow up with Lindsey Price or Dr. Lake Bells in 2-3 weeks. Consider spirometry or  PFTs at follow-up if better  She presents today stating she is better. She states she is rarely coughing. She is compliant with her QVAR, Singulair, Flonase and Zyrtec.She is compliant with her Nexium. She states she does occasionally have a tickle in her throat which makes her cough. She has not had to use her albuterol.She denies fever, chest pain orthopnea or hemoptysis.  Test Results:  CBC Latest Ref Rng & Units 04/09/2017 05/15/2014 11/10/2013  WBC 3.4 - 10.8 x10E3/uL 3.8 4.1 4.2  Hemoglobin 11.1 - 15.9 g/dL 12.4 14.6 14.7  Hematocrit 34.0 - 46.6 % 37.1 44.9 44.4  Platelets 150 - 379 x10E3/uL 261 218 226    BMP Latest Ref Rng & Units 04/09/2017 05/15/2014 11/10/2013  Glucose 65 - 99 mg/dL 92 101 103  BUN 8 - 27 mg/dL 19 19.1 24.7  Creatinine 0.57 - 1.00 mg/dL 0.82 0.8 0.8  BUN/Creat Ratio 12 - 28 23 - -  Sodium 134 - 144 mmol/L 143 143 142  Potassium 3.5 - 5.2 mmol/L 3.8 4.3 4.1  Chloride 96 - 106 mmol/L 103 - -  CO2 20 - 29 mmol/L 24 25 24   Calcium 8.7 - 10.3 mg/dL 9.2 9.2 9.3     Past medical hx Past Medical History:  Diagnosis Date  . Arthralgia 09/11/2011  . Asthma   . Breast cancer ([place])     . 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 Hyperlipidemia   . Hypertension   . Insomnia   . Thyroid disease    hypothyroidism     Social History  Substance Use Topics  . Smoking status: Former Smoker    Packs/day: 1.00    Years: 15.00    Types: Cigarettes    Quit date: 12/09/1978  . Smokeless tobacco: Former Systems developer    Quit date: 09/04/1978  . Alcohol use 0.0 oz/week     Comment: SOCIALLY wine    Lindsey Price reports that she quit smoking about 38 years ago. Her smoking use included Cigarettes. She has a 15.00 pack-year smoking history. She quit smokeless tobacco use about 38 years ago. She reports that she drinks alcohol. She reports that she does not use drugs.  Tobacco Cessation: Former smoker with a 15-pack-year smoking history quit for 02/21/1979  Past surgical hx, Family hx, Social hx all reviewed.  Current Outpatient Prescriptions on File Prior to Visit  Medication Sig  . albuterol (PROVENTIL HFA;VENTOLIN HFA) 108 (90 Base) MCG/ACT inhaler Inhale 1 puff into the lungs every 6 (six) hours as needed for wheezing or shortness of breath.  . beclomethasone (QVAR REDIHALER) 80 MCG/ACT inhaler Inhale 2 puffs  into the lungs 2 (two) times daily.  . cetirizine (ZYRTEC) 10 MG tablet Take 10 mg by mouth daily.  . Cholecalciferol (VITAMIN D3) 5000 units TABS 5,000 IU OTC vitamin D3 daily.  Marland Kitchen esomeprazole (NEXIUM) 20 MG capsule Take 20 mg by mouth daily.   Marland Kitchen ezetimibe (ZETIA) 10 MG tablet Take 1 tablet (10 mg total) by mouth daily.  . fluticasone (FLONASE) 50 MCG/ACT nasal spray Place 1 spray into both nostrils daily.  Marland Kitchen levothyroxine (SYNTHROID, LEVOTHROID) 100 MCG tablet 1 tablet every other day alternating with 88 mcg tablet  . levothyroxine (SYNTHROID, LEVOTHROID) 88 MCG tablet 1 tablet every other day alternating with 100 mcg tabs  . losartan-hydrochlorothiazide (HYZAAR) 100-12.5 MG per tablet Take 1 tablet by mouth daily.  . montelukast (SINGULAIR) 10 MG tablet TAKE 1  TABLET BY MOUTH EVERY DAY FOR ALLERGIES & ASTHMA  . simvastatin (ZOCOR) 10 MG tablet Take 10 mg by mouth at bedtime.  Marland Kitchen venlafaxine XR (EFFEXOR-XR) 75 MG 24 hr capsule Take 1 capsule (75 mg total) by mouth at bedtime.  . Vitamin D, Ergocalciferol, (DRISDOL) 50000 units CAPS capsule Take one tablet wkly   No current facility-administered medications on file prior to visit.      Allergies  Allergen Reactions  . Advair Diskus [Fluticasone-Salmeterol]     Hoarseness.  . Neosporin [Neomycin-Polymyxin-Gramicidin] Itching    Review Of Systems:  Constitutional:   No  weight loss, night sweats,  Fevers, chills, fatigue, or  lassitude.  HEENT:   No headaches,  Difficulty swallowing,  Tooth/dental problems, or  Sore throat,                No sneezing, itching, ear ache, nasal congestion, post nasal drip,   CV:  No chest pain,  Orthopnea, PND, swelling in lower extremities, anasarca, dizziness, palpitations, syncope.   GI  No heartburn, indigestion, abdominal pain, nausea, vomiting, diarrhea, change in bowel habits, loss of appetite, bloody stools.   Resp: No shortness of breath with exertion or at rest.  No excess mucus, no productive cough,  rare non-productive cough,  No coughing up of blood.  No change in color of mucus.  No wheezing.  No chest wall deformity  Skin: no rash or lesions.  GU: no dysuria, change in color of urine, no urgency or frequency.  No flank pain, no hematuria   MS:  No joint pain or swelling.  No decreased range of motion.  No back pain.  Psych:  No change in mood or affect. No depression or anxiety.  No memory loss.   Vital Signs BP 130/70 (BP Location: Left Arm, Cuff Size: Normal)   Pulse 67   Ht 5\' 7"  (1.702 m)   Wt 159 lb (72.1 kg)   SpO2 95%   BMI 24.90 kg/m    Physical Exam:  General- No distress,  A&Ox3, pleasant ENT: No sinus tenderness, TM clear, pale nasal mucosa, no oral exudate,no post nasal drip, no LAN Cardiac: S1, S2, regular rate and  rhythm, no murmur Chest: No wheeze/ rales/ dullness; no accessory muscle use, no nasal flaring, no sternal retractions Abd.: Soft Non-tender, non-obese, non-distended Ext: No clubbing cyanosis, edema Neuro:  normal strength Skin: No rashes, warm and dry Psych: normal mood and behavior   Assessment/Plan  Cough variant asthma Resolution of flare Plan Continue using your Singulair, Zyrtec, Flonase and QVar as you have been doing. Continue Nexium as you have been doing. Add Pepcid 20 mg at bedtime.( Generic is ok) Sips of  water instead of throat clearing Throat soothing with sugar free jolly ranchers, lemon heads or werthers. Avoid mint, menthol or chocolate. GERD diet PFT's in March prior to appointment with Dr. Lake Bells. Follow up with Dr. Lake Bells in 6 months Please contact office for sooner follow up if symptoms do not improve or worsen or seek emergency care       Magdalen Spatz, NP 05/28/2017  1:54 PM

## 2017-05-28 NOTE — Assessment & Plan Note (Signed)
Resolution of flare Plan Continue using your Singulair, Zyrtec, Flonase and QVar as you have been doing. Continue Nexium as you have been doing. Add Pepcid 20 mg at bedtime.( Generic is ok) Sips of water instead of throat clearing Throat soothing with sugar free jolly ranchers, lemon heads or werthers. Avoid mint, menthol or chocolate. GERD diet PFT's in March prior to appointment with Dr. Lake Bells. Follow up with Dr. Lake Bells in 6 months Please contact office for sooner follow up if symptoms do not improve or worsen or seek emergency care

## 2017-05-28 NOTE — Patient Instructions (Addendum)
It is good to see you today. Continue using your Singulair, Zyrtec, Flonase and QVar as you have been doing. Continue Nexium as you have been doing. Add Pepcid 20 mg at bedtime.( Generic is ok) Sips of water instead of throat clearing Throat soothing with sugar free jolly ranchers, lemon heads or werthers. Avoid mint, menthol or chocolate. GERD diet PFT's in March prior to appointment with Dr. Lake Bells. Follow up with Dr. Lake Bells in 6 months Please contact office for sooner follow up if symptoms do not improve or worsen or seek emergency care

## 2017-05-29 ENCOUNTER — Ambulatory Visit
Admission: RE | Admit: 2017-05-29 | Discharge: 2017-05-29 | Disposition: A | Discharge status: Home / Self Care | Payer: PPO | Source: Ambulatory Visit | Attending: Family Medicine | Admitting: Family Medicine

## 2017-05-29 DIAGNOSIS — E559 Vitamin D deficiency, unspecified: Secondary | ICD-10-CM

## 2017-05-29 DIAGNOSIS — Z13820 Encounter for screening for osteoporosis: Secondary | ICD-10-CM

## 2017-05-29 DIAGNOSIS — Z780 Asymptomatic menopausal state: Secondary | ICD-10-CM | POA: Diagnosis not present

## 2017-05-29 DIAGNOSIS — M85852 Other specified disorders of bone density and structure, left thigh: Secondary | ICD-10-CM | POA: Diagnosis not present

## 2017-05-29 DIAGNOSIS — M8580 Other specified disorders of bone density and structure, unspecified site: Secondary | ICD-10-CM

## 2017-05-29 DIAGNOSIS — E039 Hypothyroidism, unspecified: Secondary | ICD-10-CM

## 2017-05-30 ENCOUNTER — Telehealth: Payer: Self-pay

## 2017-05-30 ENCOUNTER — Ambulatory Visit: Payer: Self-pay | Admitting: Acute Care

## 2017-05-30 NOTE — Telephone Encounter (Signed)
Spoke to patient about the results of her Bone Density study.  She wants to know if we can obtain thyroid levels when she comes in for her follow up appointment on 06/05/2017. MPulliam, CMA/RT(R)

## 2017-05-30 NOTE — Telephone Encounter (Signed)
We will discuss concerns she may have at her upcoming office visit

## 2017-06-05 ENCOUNTER — Ambulatory Visit (INDEPENDENT_AMBULATORY_CARE_PROVIDER_SITE_OTHER): Payer: PPO | Admitting: Family Medicine

## 2017-06-05 ENCOUNTER — Encounter: Payer: Self-pay | Admitting: Family Medicine

## 2017-06-05 VITALS — BP 121/77 | HR 76 | Ht 67.0 in | Wt 160.2 lb

## 2017-06-05 DIAGNOSIS — E039 Hypothyroidism, unspecified: Secondary | ICD-10-CM

## 2017-06-05 DIAGNOSIS — Z0000 Encounter for general adult medical examination without abnormal findings: Secondary | ICD-10-CM

## 2017-06-05 DIAGNOSIS — Z23 Encounter for immunization: Secondary | ICD-10-CM | POA: Diagnosis not present

## 2017-06-05 NOTE — Progress Notes (Signed)
Subjective:   Lindsey Price is a 71 y.o. female who presents for Medicare Annual (Subsequent) preventive examination.  Patient continues to use her elliptical for an hour at least 6 days a week.  She is extremely active.  She helps look after her 3 grandkids.  No acute complaints today. ROS negative  Colonoscopy is done in 2015 with polyp findings.  Repeat 5 years.  Patient knows about this and will make sure she follows up.  Due to her breast cancer history she is on top of getting her mammograms regularly.  That is up-to-date.    Activities of Daily Living In your present state of health, do you have difficulty performing the following activities?  1- Driving - no 2- Managing money - no 3- Feeding yourself - no 4- Getting from the bed to the chair - no 5- Climbing a flight of stairs - no 6- Preparing food and eating - no 7- Bathing or showering - no 8- Getting dressed - no 9- Getting to the toilet - no 10- Using the toilet - no 11- Moving around from place to place - no  Functional Status Survey: Is the patient deaf or have difficulty hearing?: No Does the patient have difficulty seeing, even when wearing glasses/contacts?: No Does the patient have difficulty concentrating, remembering, or making decisions?: No Does the patient have difficulty walking or climbing stairs?: No Does the patient have difficulty dressing or bathing?: No Does the patient have difficulty doing errands alone such as visiting a doctor's office or shopping?: No No flowsheet data found.   6CIT Screen 06/05/2017  What Year? 0 points  What month? 0 points  What time? 0 points  Count back from 20 0 points  Months in reverse 0 points  Repeat phrase 2 points  Total Score 2    Depression screen [facility] 2/9 06/05/2017 03/12/2017  Decreased Interest 0 0  Down, Depressed, Hopeless 0 0  PHQ - 2 Score 0 0  Altered sleeping 0 -  Tired, decreased energy 0 -  Change in appetite 0 -  Feeling bad or failure  about yourself  0 -  Trouble concentrating 0 -  Moving slowly or fidgety/restless 0 -  Suicidal thoughts 0 -  PHQ-9 Score 0 -  Difficult doing work/chores Not difficult at all -   Fall Risk  06/05/2017 03/12/2017  Falls in the past year? No No   Current Exercise Habits: Home exercise routine, Frequency (Times/Week): 6 Exercise limited by: Other - see comments (ellipitical)   Review of Systems:  Review of Systems: General:   Denies fever, chills, unexplained weight loss.  Optho/Auditory:   Denies visual changes, blurred vision/LOV Respiratory:   Denies SOB, DOE more than baseline levels.  Cardiovascular:   Denies chest pain, palpitations, new onset peripheral edema  Gastrointestinal:   Denies nausea, vomiting, diarrhea.  Genitourinary: Denies dysuria, freq/ urgency, flank pain or discharge from genitals.  Endocrine:     Denies hot or cold intolerance, polyuria, polydipsia. Musculoskeletal:   Denies unexplained myalgias, joint swelling, unexplained arthralgias, gait problems.  Skin:  Denies rash, suspicious lesions Neurological:     Denies dizziness, unexplained weakness, numbness  Psychiatric/Behavioral:   Denies mood changes, suicidal or homicidal ideations, hallucinations       Objective:     Vitals: BP 121/77   Pulse 76   Ht 5\' 7"  (1.702 m)   Wt 160 lb 3.2 oz (72.7 kg)   BMI 25.09 kg/m   Body mass index is  25.09 kg/m.   Tobacco History  Smoking Status  . Former Smoker  . Packs/day: 1.00  . Years: 15.00  . Types: Cigarettes  . Quit date: 12/09/1978  Smokeless Tobacco  . Former Systems developer  . Quit date: 09/04/1978     Counseling given: Not Answered   Past Medical History:  Diagnosis Date  . Arthralgia 09/11/2011  . Asthma   . Breast cancer ([place])   . 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 Hyperlipidemia   . Hypertension   . Insomnia   . Thyroid disease    hypothyroidism   Past Surgical History:  Procedure Laterality Date  . ABDOMINAL HYSTERECTOMY   1975   partial  . BREAST LUMPECTOMY  2011   right breast  . COLONOSCOPY WITH PROPOFOL N/A 04/14/2014   Procedure: COLONOSCOPY WITH PROPOFOL;  Surgeon: Garlan Fair, MD;  Location: [facility];  Service: Endoscopy;  Laterality: N/A;  . ROTATOR CUFF REPAIR Left 04/2013   Dr. Para March   Family History  Problem Relation Age of Onset  . Cancer Mother        BREAST  . Cancer Father        pancreatic  . Cancer Sister        sebacous cell carcinoma  . Cancer Maternal Aunt        lung  . Cancer Paternal Uncle        colon, stomach   History  Sexual Activity  . Sexual activity: Yes    Outpatient Encounter Prescriptions as of 06/05/2017  Medication Sig  . albuterol (PROVENTIL HFA;VENTOLIN HFA) 108 (90 Base) MCG/ACT inhaler Inhale 1 puff into the lungs every 6 (six) hours as needed for wheezing or shortness of breath.  . beclomethasone (QVAR REDIHALER) 80 MCG/ACT inhaler Inhale 2 puffs into the lungs 2 (two) times daily.  . cetirizine (ZYRTEC) 10 MG tablet Take 10 mg by mouth daily.  . Cholecalciferol (VITAMIN D3) 5000 units TABS 5,000 IU OTC vitamin D3 daily.  Marland Kitchen esomeprazole (NEXIUM) 20 MG capsule Take 20 mg by mouth daily.   Marland Kitchen ezetimibe (ZETIA) 10 MG tablet Take 1 tablet (10 mg total) by mouth daily.  . famotidine (PEPCID) 20 MG tablet Take 1 tablet (20 mg total) by mouth 2 (two) times daily. (Patient taking differently: Take 20 mg by mouth daily. )  . fluticasone (FLONASE) 50 MCG/ACT nasal spray Place 1 spray into both nostrils daily.  Marland Kitchen levothyroxine (SYNTHROID, LEVOTHROID) 100 MCG tablet 1 tablet every other day alternating with 88 mcg tablet  . levothyroxine (SYNTHROID, LEVOTHROID) 88 MCG tablet 1 tablet every other day alternating with 100 mcg tabs  . losartan-hydrochlorothiazide (HYZAAR) 100-12.5 MG per tablet Take 1 tablet by mouth daily.  . montelukast (SINGULAIR) 10 MG tablet TAKE 1 TABLET BY MOUTH EVERY DAY FOR ALLERGIES & ASTHMA  . simvastatin (ZOCOR) 10 MG tablet Take 10 mg  by mouth at bedtime.  Marland Kitchen venlafaxine XR (EFFEXOR-XR) 75 MG 24 hr capsule Take 1 capsule (75 mg total) by mouth at bedtime.  . Vitamin D, Ergocalciferol, (DRISDOL) 50000 units CAPS capsule Take one tablet wkly   No facility-administered encounter medications on file as of 06/05/2017.     Activities of Daily Living In your present state of health, do you have any difficulty performing the following activities: 06/05/2017  Hearing? N  Vision? N  Difficulty concentrating or making decisions? N  Walking or climbing stairs? N  Dressing or bathing? N  Doing errands, shopping? N  Some  recent data might be hidden    Patient Care Team: Mellody Dance, DO as PCP - General (Family Medicine) Juanito Doom, MD as Consulting Physician (Pulmonary Disease) Elsie Saas, MD as Consulting Physician (Orthopedic Surgery) Allyn Kenner, MD as Consulting Physician (Dermatology) Katy Apo, MD as Consulting Physician (Ophthalmology) Jovita Kussmaul, MD as Consulting Physician (General Surgery) Nicholas Lose, MD as Consulting Physician (Hematology and Oncology) Garlan Fair, MD as Consulting Physician (Gastroenterology)    Assessment:    Exercise Activities and Dietary recommendations Current Exercise Habits: Home exercise routine, Frequency (Times/Week): 6, Exercise limited by: Other - see comments (ellipitical)  Goals    None     Fall Risk Fall Risk  06/05/2017 03/12/2017  Falls in the past year? No No   Depression Screen PHQ 2/9 Scores 06/05/2017 03/12/2017  PHQ - 2 Score 0 0  PHQ- 9 Score 0 -     Cognitive Function     6CIT Screen 06/05/2017  What Year? 0 points  What month? 0 points  What time? 0 points  Count back from 20 0 points  Months in reverse 0 points  Repeat phrase 2 points  Total Score 2    Immunization History  Administered Date(s) Administered  . Hepatitis A 06/16/2016  . Influenza Split 11/23/2014, 06/05/2015  . Influenza-Unspecified 05/13/2017  .  Pneumococcal Conjugate-13 05/27/2014  . Pneumococcal Polysaccharide-23 11/16/2016  . Tdap 03/05/2012   Screening Tests Health Maintenance  Topic Date Due  . Hepatitis C Screening  03/12/2029 (Originally 14-Sep-1945)  . MAMMOGRAM  09/19/2018  . TETANUS/TDAP  03/05/2022  . COLONOSCOPY  04/14/2024  . INFLUENZA VACCINE  Completed  . DEXA SCAN  Completed  . PNA vac Low Risk Adult  Completed      Plan:     1. Encounter for Medicare annual wellness exam - Encouraged to learn a new language or learn a new instrument.  2. Acquired hypothyroidism - TSH+T4F+T3Free  I have personally reviewed and noted the following in the patient's chart:   . Medical and social history . Use of alcohol, tobacco or illicit drugs  . Current medications and supplements . Functional ability and status . Nutritional status . Physical activity . Advanced directives . List of other physicians . Hospitalizations, surgeries, and ER visits in previous 12 months . Vitals . Screenings to include cognitive, depression, and falls . Referrals and appointments  In addition, I have reviewed and discussed with patient certain preventive protocols, quality metrics, and best practice recommendations. A written personalized care plan for preventive services as well as general preventive health recommendations were provided to patient.

## 2017-06-05 NOTE — Progress Notes (Deleted)
medi

## 2017-06-05 NOTE — Patient Instructions (Signed)
Preventive Care for Adults  A healthy lifestyle and preventive care can promote health and wellness. Preventive health guidelines for men include the following key practices:  .   A routine yearly physical is a good way to check with your health care provider about your health and preventative screening. It is a chance to share any concerns and updates on your health and to receive a thorough exam.  .  Visit your dentist for a routine exam and preventative care every 6 months. Brush your teeth twice a day and floss once a day. Good oral hygiene prevents tooth decay and gum disease.  .  The frequency of eye exams is based on your age, health, family medical history, use of contact lenses, and other factors.  Follow your health care provider's recommendations for frequency of eye exams.  .  Eat a healthy diet.  Foods such as vegetables, fruits, whole grains, low-fat dairy products, and lean protein foods contain the nutrients you need without too many calories.  Decrease your intake of foods high in solid fats, added sugars, and salt.  Eat the right amount of calories for you.  Get information about a proper diet from your health care provider, if necessary.  .  Regular physical exercise is one of the most important things you can do for your health.  Most adults should get at least 150 minutes of moderate-intensity exercise (any activity that increases your heart rate and causes you to sweat) each week.  In addition, most adults need muscle-strengthening exercises on 2 or more days a week.  .  Maintain a healthy weight. The body mass index (BMI) is a screening tool to identify possible weight problems. It provides an estimate of body fat based on height and weight. Your health care provider can find your BMI and can help you achieve or maintain a healthy weight. For adults 20 years and older: A BMI below 18.5 is considered underweight. A BMI of 18.5 to 24.9 is normal. A BMI of 25 to 29.9 is  considered overweight. A BMI of 30 and above is considered obese.  .  Maintain normal blood lipids and cholesterol levels by exercising and minimizing your intake of saturated fat. Eat a balanced diet with plenty of fruit and vegetables. Blood tests for lipids and cholesterol should begin at age 66 and be repeated every 5 years. If your lipid or cholesterol levels are high, you are over 50, or you are at high risk for heart disease, you may need your cholesterol levels checked more frequently. Ongoing high lipid and cholesterol levels should be treated with medicines if diet and exercise are not working.  .  If you smoke, find out from your health care provider how to quit. If you do not use tobacco, do not start.  . If you choose to drink alcohol, do not have more than 2 drinks per day. One drink is considered to be 12 ounces (355 mL) of beer, 5 ounces (148 mL) of wine, or 1.5 ounces (44 mL) of liquor.  Marland Kitchen Avoid use of street drugs. Do not share needles with anyone. Ask for help if you need support or instructions about stopping the use of drugs.  . High blood pressure causes heart disease and increases the risk of stroke. Your blood pressure should be checked at least every 1-2 years. Ongoing high blood pressure should be treated with medicines, if weight loss and exercise are not effective.  . If you are 33-64 years old,  ask your health care provider if you should take aspirin to prevent heart disease.  . Diabetes screening involves taking a blood sample to check your fasting blood sugar level.  This should be done once every 3 years, after age 19, if you are within normal weight and without risk factors for diabetes.  Testing should be considered at a younger age or be carried out more frequently if you are overweight and have at least 1 risk factor for diabetes.  . Colorectal cancer can be detected and often prevented. Most routine colorectal cancer screening begins at the age of 51 and  continues through age 1. However, your health care provider may recommend screening at an earlier age if you have risk factors for colon cancer. On a yearly basis, your health care provider may provide home test kits to check for hidden blood in the stool. Use of a small camera at the end of a tube to directly examine the colon (sigmoidoscopy or colonoscopy) can detect the earliest forms of colorectal cancer. Talk to your health care provider about this at age 31, when routine screening begins. Direct exam of the colon should be repeated every 5-10 years through age 90, unless early forms of precancerous polyps or small growths are found.  .  Lung cancer screening is recommended for adults aged 47-80 years who are at high risk for developing lung cancer because of a history of smoking. A yearly low-dose CT scan of the lungs is recommended for people who have at least a 30-pack-year history of smoking and are a current smoker or have quit within the past 15 years. A pack year of smoking is smoking an average of 1 pack of cigarettes a day for 1 year (for example: 1 pack a day for 30 years or 2 packs a day for 15 years). Yearly screening should continue until the smoker has stopped smoking for at least 15 years. Yearly screening should be stopped for people who develop a health problem that would prevent them from having lung cancer treatment.  . Talk with your health care provider about prostate cancer screening.  . Testicular cancer screening is recommended for adult males. Screening includes self-exam and a health care provider exam. Consult with your health care provider about any symptoms you have or any concerns you have about testicular cancer.  . Use sunscreen. Apply sunscreen liberally and repeatedly throughout the day. You should seek shade when your shadow is shorter than you. Protect yourself by wearing long sleeves, pants, a wide-brimmed hat, and sunglasses year round, whenever you are  outdoors.  . Once a month, do a whole-body skin exam, using a mirror to look at the skin on your back. Tell your health care provider about new moles, moles that have irregular borders, moles that are larger than a pencil eraser, or moles that have changed in shape or color.    ++++++++++++++++++++++++++++++++++++++++++++++++++++++++++++++++++  Stay current with required vaccines (immunizations).  ? Influenza vaccine. All adults should be immunized every year.  ? Tetanus, diphtheria, and acellular pertussis (Td, Tdap) vaccine. An adult who has not previously received Tdap or who does not know his vaccine status should receive 1 dose of Tdap. This initial dose should be followed by tetanus and diphtheria toxoids (Td) booster doses every 10 years. Adults with an unknown or incomplete history of completing a 3-dose immunization series with Td-containing vaccines should begin or complete a primary immunization series including a Tdap dose. Adults should receive a Td booster every  10 years.  ? Varicella vaccine. An adult without evidence of immunity to varicella should receive 2 doses or a second dose if he has previously received 1 dose.  ? Human papillomavirus (HPV) vaccine. Males aged 27-21 years who have not received the vaccine previously should receive the 3-dose series. Males aged 22-26 years may be immunized. Immunization is recommended through the age of 12 years for any female who has sex with males and did not get any or all doses earlier. Immunization is recommended for any person with an immunocompromised condition through the age of 75 years if he did not get any or all doses earlier. During the 3-dose series, the second dose should be obtained 4-8 weeks after the first dose. The third dose should be obtained 24 weeks after the first dose and 16 weeks after the second dose.  ? Zoster vaccine. One dose is recommended for adults aged 36 years or older unless certain conditions are  present.   ? PREVNAR - Pneumococcal 13-valent conjugate (PCV13) vaccine. When indicated, a person who is uncertain of his immunization history and has no record of immunization should receive the PCV13 vaccine. An adult aged 59 years or older who has certain medical conditions and has not been previously immunized should receive 1 dose of PCV13 vaccine. This PCV13 should be followed with a dose of pneumococcal polysaccharide (PPSV23) vaccine. The PPSV23 vaccine dose should be obtained at least 8 weeks after the dose of PCV13 vaccine. An adult aged 5 years or older who has certain medical conditions and previously received 1 or more doses of PPSV23 vaccine should receive 1 dose of PCV13. The PCV13 vaccine dose should be obtained 1 or more years after the last PPSV23 vaccine dose.   ? PNEUMOVAX - Pneumococcal polysaccharide (PPSV23) vaccine. When PCV13 is also indicated, PCV13 should be obtained first. All adults aged 47 years and older should be immunized. An adult younger than age 51 years who has certain medical conditions should be immunized. Any person who resides in a nursing home or long-term care facility should be immunized. An adult smoker should be immunized. People with an immunocompromised condition and certain other conditions should receive both PCV13 and PPSV23 vaccines. People with human immunodeficiency virus (HIV) infection should be immunized as soon as possible after diagnosis. Immunization during chemotherapy or radiation therapy should be avoided. Routine use of PPSV23 vaccine is not recommended for American Indians, [place] Natives, or people younger than 65 years unless there are medical conditions that require PPSV23 vaccine. When indicated, people who have unknown immunization and have no record of immunization should receive PPSV23 vaccine. One-time revaccination 5 years after the first dose of PPSV23 is recommended for people aged 19-64 years who have chronic kidney failure,  nephrotic syndrome, asplenia, or immunocompromised conditions. People who received 1-2 doses of PPSV23 before age 59 years should receive another dose of PPSV23 vaccine at age 64 years or later if at least 5 years have passed since the previous dose. Doses of PPSV23 are not needed for people immunized with PPSV23 at or after age 18 years.   ? Hepatitis A vaccine. Adults who wish to be protected from this disease, have certain high-risk conditions, work with hepatitis A-infected animals, work in hepatitis A research labs, or travel to or work in countries with a high rate of hepatitis A should be immunized. Adults who were previously unvaccinated and who anticipate close contact with an international adoptee during the first 60 days after arrival in the  [place] from a country with a high rate of hepatitis A should be immunized.  ? Hepatitis B vaccine. Adults should be immunized if they wish to be protected from this disease, have certain high-risk conditions, may be exposed to blood or other infectious body fluids, are household contacts or sex partners of hepatitis B positive people, are clients or workers in certain care facilities, or travel to or work in countries with a high rate of hepatitis B.     Preventive Service / Frequency  . Ages 8 to 60  Blood pressure check.  Lipid and cholesterol check  Lung cancer screening. / Every year if you are aged 43-80 years and have a 30-pack-year history of smoking and currently smoke or have quit within the past 15 years. Yearly screening is stopped once you have quit smoking for at least 15 years or develop a health problem that would prevent you from having lung cancer treatment.  Fecal occult blood test (FOBT) of stool. / Every year beginning at age 64 and continuing until age 68. You may not have to do this test if you get a colonoscopy every 10 years.  Flexible sigmoidoscopy** or colonoscopy.** / Every 5 years for a flexible  sigmoidoscopy or every 10 years for a colonoscopy beginning at age 60 and continuing until age 49. Screening for abdominal aortic aneurysm (AAA) by ultrasound is recommended for people who have history of high blood pressure or who are current or former smokers.  ++++++++++++++++++++++++++++++++++++++++++++++++++++++++++++++  Recommend Adult Low Dose Aspirin or coated Aspirin 81 mg daily To reduce risk of Colon Cancer 20 % Skin Cancer 26 %  Melanoma 46% and Pancreatic cancer 60%  +++++++++++++++++++++++++++++++++++++++++++++++++++++++++++++  Vitamin D goal is between 50-100. Please make sure that you are taking your Vitamin D as directed.  It is very important as a natural anti-inflammatory - helping with muscle and joint aches; as well as helping hair, skin, and nails; as well as reducing stroke, heart attack and cancer risk. It helps your bones and helps with mood. It also decreases numerous cancer risks so please take it as directed.  - Low Vit D is associated with a 200-300% higher risk for CANCER and 200-300% higher risk for HEART ATTACK & STROKE.  It is also associated with higher death rate at younger ages, autoimmune diseases like Rheumatoid arthritis, Lupus, Multiple Sclerosis; also many other serious conditions, like depression, Alzheimer's Dementia, infertility, muscle aches, fatigue, fibromyalgia - just to name a few.  +++++++++++++++++++++++++++++++++++++++++++++++++++++++++++  Recommend the book "The END of DIETING" by Dr Excell Seltzer & the book "The END of DIABETES " by Dr Excell Seltzer At [email or web address] - get book & Audio CD's   --->Being diabetic has a 300% increased risk for heart attack, stroke, cancer, and alzheimer- type vascular dementia. It is very important that you work harder with diet by avoiding all foods that are white. Avoid white rice (brown & wild rice is OK), white potatoes (sweet potatoes in moderation is OK), White bread or wheat bread or anything made out  of white flour like bagels, donuts, rolls, buns, biscuits, cakes, pastries, cookies, pizza crust, and pasta (made from white flour & egg whites) - vegetarian pasta or spinach or wheat pasta is OK. Multigrain breads like Arnold's or [employer], or multigrain sandwich thins or flatbreads. Diet, exercise and weight loss can reverse and cure diabetes in the early stages. Diet, exercise and weight loss is very important in the control and prevention of complications of diabetes which  affects every system in your body, ie. Brain - dementia/stroke, eyes - glaucoma/blindness, heart - heart attack/heart failure, kidneys - dialysis, stomach - gastric paralysis, intestines - malabsorption, nerves - severe painful neuritis, circulation - gangrene & loss of a leg(s), and finally cancer and Alzheimers.  I recommend avoid fried & greasy foods, sweets/candy, white rice (brown or wild rice or Quinoa is OK), white potatoes (sweet potatoes are OK) - anything made from white flour - bagels, doughnuts, rolls, buns, biscuits,white and wheat breads, pizza crust and traditional pasta made of white flour & egg white(vegetarian pasta or spinach or wheat pasta is OK). Multi-grain bread is OK - like multi-grain flat bread or sandwich thins. Avoid alcohol in excess.  Exercise is also important. Eat all the vegetables you want - avoid fatty meats, especially red meat and dairy - especially cheese. Cheese is the most concentrated form of trans-fats which is the worst thing to clog up our arteries. Veggie cheese is OK which can be found in the fresh produce section at [employer] or [employer] or [employer].  ++++++++++++++++++++++ DASH Eating Plan  DASH stands for "Dietary Approaches to Stop Hypertension."  The DASH eating plan is a healthy eating plan that has been shown to reduce high blood pressure (hypertension). Additional health benefits may include reducing the risk of type 2 diabetes mellitus, heart disease, and stroke.  The DASH eating plan may also help with weight loss.  WHAT DO I NEED TO KNOW ABOUT THE DASH EATING PLAN? For the DASH eating plan, you will follow these general guidelines: . Choose foods with a percent daily value for sodium of less than 5% (as listed on the food label). . Use salt-free seasonings or herbs instead of table salt or sea salt. . Check with your health care provider or pharmacist before using salt substitutes. . Eat lower-sodium products, often labeled as "lower sodium" or "no salt added." . Eat fresh foods. . Eat more vegetables, fruits, and low-fat dairy products. . Choose whole grains. Look for the word "whole" as the first word in the ingredient list. . Choose fish . Limit sweets, desserts, sugars, and sugary drinks. . Choose heart-healthy fats. . Eat veggie cheese . Eat more home-cooked food and less restaurant, buffet, and fast food. . Limit fried foods. Lacinda Axon foods using methods other than frying. . Limit canned vegetables. If you do use them, rinse them well to decrease the sodium. . When eating at a restaurant, ask that your food be prepared with less salt, or no salt if possible.   WHAT FOODS CAN I EAT? Read Dr Fara Olden Fuhrman's books on The End of Dieting & The End of Diabetes  Grains Whole grain or whole wheat bread. Brown rice. Whole grain or whole wheat pasta. Quinoa, bulgur, and whole grain cereals. Low-sodium cereals. Corn or whole wheat flour tortillas. Whole grain cornbread. Whole grain crackers. Low-sodium crackers.  Vegetables Fresh or frozen vegetables (raw, steamed, roasted, or grilled). Low-sodium or reduced-sodium tomato and vegetable juices. Low-sodium or reduced-sodium tomato sauce and paste. Low-sodium or reduced-sodium canned vegetables.  Fruits All fresh, canned (in natural juice), or frozen fruits.  Protein Products All fish and seafood. Dried beans, peas, or lentils. Unsalted nuts and seeds. Unsalted canned  beans.  Dairy Low-fat dairy products, such as skim or 1% milk, 2% or reduced-fat cheeses, low-fat ricotta or cottage cheese, or plain low-fat yogurt. Low-sodium or reduced-sodium cheeses.  Fats and Oils Tub margarines without trans fats. Light or reduced-fat mayonnaise and salad  dressings (reduced sodium). Avocado. Safflower, olive, or canola oils. Natural peanut or almond butter.  Other Unsalted popcorn and pretzels. The items listed above may not be a complete list of recommended foods or beverages. Contact your dietitian for more options.  ++++++++++++++++++++++++++++++++++++++++++++++++++++++++++++++++  WHAT FOODS ARE NOT RECOMMENDED?  Grains/ White flour or wheat flour White bread. White pasta. White rice. Refined cornbread. Bagels and croissants. Crackers that contain trans fat. Vegetables Creamed or fried vegetables. Vegetables in a . Regular canned vegetables. Regular canned tomato sauce and paste. Regular tomato and vegetable juices. Fruits Dried fruits. Canned fruit in light or heavy syrup. Fruit juice. Meat and Other Protein Products Meat in general - RED meat & White meat. Fatty cuts of meat. Ribs, chicken wings, all processed meats as bacon, sausage, bologna, salami, fatback, hot dogs, bratwurst and packaged luncheon meats. Dairy Whole or 2% milk, cream, half-and-half, and cream cheese. Whole-fat or sweetened yogurt. Full-fat cheeses or blue cheese. Non-dairy creamers and whipped toppings. Processed cheese, cheese spreads, or cheese curds.  Condiments Onion and garlic salt, seasoned salt, table salt, and sea salt. Canned and packaged gravies. Worcestershire sauce. Tartar sauce. Barbecue sauce. Teriyaki sauce. Soy sauce, including reduced sodium. Steak sauce. Fish sauce. Oyster sauce. Cocktail sauce. Horseradish. Ketchup and mustard. Meat flavorings and tenderizers. Bouillon cubes. Hot sauce. Tabasco sauce. Marinades. Taco seasonings. Relishes. Fats and Oils Butter, stick  margarine, lard, shortening and bacon fat. Coconut, palm kernel, or palm oils. Regular salad dressings. Pickles and olives. Salted popcorn and pretzels. The items listed above may not be a complete list of foods and beverages to avoid.

## 2017-06-06 LAB — TSH+T4F+T3FREE
Free T4: 1.35 ng/dL (ref 0.82–1.77)
T3, Free: 2.6 pg/mL (ref 2.0–4.4)
TSH: 1.26 u[IU]/mL (ref 0.450–4.500)

## 2017-06-08 ENCOUNTER — Encounter: Payer: Self-pay | Admitting: Family Medicine

## 2017-06-08 ENCOUNTER — Ambulatory Visit (INDEPENDENT_AMBULATORY_CARE_PROVIDER_SITE_OTHER): Payer: PPO | Admitting: Family Medicine

## 2017-06-08 VITALS — BP 121/80 | HR 75 | Ht 67.0 in | Wt 159.7 lb

## 2017-06-08 DIAGNOSIS — R21 Rash and other nonspecific skin eruption: Secondary | ICD-10-CM

## 2017-06-08 MED ORDER — TRIAMCINOLONE ACETONIDE 0.1 % EX CREA: 1.0000 "application " | TOPICAL_CREAM | Freq: Two times a day (BID) | CUTANEOUS | 0 refills | Status: DC

## 2017-06-08 MED ORDER — METHYLPREDNISOLONE ACETATE 80 MG/ML IJ SUSP
80.0000 mg | Freq: Once | INTRAMUSCULAR | Status: AC
  Administered 2017-06-08: 80 mg via INTRAMUSCULAR

## 2017-06-08 MED ORDER — VALACYCLOVIR HCL 1 G PO TABS: 1000.0000 mg | ORAL_TABLET | Freq: Three times a day (TID) | ORAL | 0 refills | Status: DC

## 2017-06-08 MED ORDER — RANITIDINE HCL 150 MG PO TABS: 150.0000 mg | ORAL_TABLET | Freq: Two times a day (BID) | ORAL | Status: DC

## 2017-06-08 NOTE — Patient Instructions (Signed)
Take the antiviral valacyclovir medications so we ensure we cover for shingles\ zoster.  However as we discussed, that is less likely and this looks more like a contact dermatitis.  You're going to use the triamcinolone cream to the face 2 times daily but to the body 3 times daily.  Take ranitidine in addition to Benadryl which will help with the itch.  Please let me know if you're not improved and starting to turn the corner by mid week next week.

## 2017-06-08 NOTE — Progress Notes (Signed)
Pt here for an acute care OV today   Impression and Recommendations:    1. Rash and nonspecific skin eruption    -Meds given per below  - Patient will continue her Zyrtec and add Benadryl as needed for her itch along with ranitidine.  - Patient recently was seen by her pulmonologist and treated for an asthma flare with prednisone so we will forego using steroid dose pack.  Injection of Depo-Medrol today followed by topical steroid creams.  - Patient will let us know if she is worse not better.  Encouraged not to scratch the area as this may spread.  - Take the antiviral valacyclovir medications so we ensure we cover for shingles\ zoster.  However as we discussed, that is less likely and this looks more like a contact dermatitis.  You're going to use the triamcinolone cream to the face 2 times daily but to the body 3 times daily.  Take ranitidine in addition to Benadryl which will help with the itch.  Please let me know if you're not improved and starting to turn the corner by mid week next week.  The patient was counseled, risk factors were discussed, anticipatory guidance given.   Meds ordered this encounter  Medications  . valACYclovir (VALTREX) 1000 MG tablet    Sig: Take 1 tablet (1,000 mg total) by mouth 3 (three) times daily.    Dispense:  30 tablet    Refill:  0  . triamcinolone cream (KENALOG) 0.1 %    Sig: Apply 1 application topically 2 (two) times daily.    Dispense:  453.6 g    Refill:  0  . ranitidine (ZANTAC) 150 MG tablet    Sig: Take 1 tablet (150 mg total) by mouth 2 (two) times daily. For help with itch associated rash; along with Benadryl  . methylPREDNISolone acetate (DEPO-MEDROL) injection 80 mg    Gross side effects, risk and benefits, and alternatives of medications and treatment plan in general discussed with patient.  Patient is aware that all medications have potential side effects and we are unable to predict every side effect or drug-drug  interaction that may occur.   Patient will call with any questions prior to using medication if they have concerns.  Expresses verbal understanding and consents to current therapy and treatment regimen.  No barriers to understanding were identified.  Red flag symptoms and signs discussed in detail.  Patient expressed understanding regarding what to do in case of emergency\urgent symptoms  Please see AVS handed out to patient at the end of our visit for further patient instructions/ counseling done pertaining to today's office visit.   Return if symptoms worsen or fail to improve.     Note: This document was prepared using Dragon voice recognition software and may include unintentional dictation errors.  Mellody Dance 12:47 PM --------------------------------------------------------------------------------------------------------------------------------------------------------------------------------------------------------------------------------------------    Subjective:    CC:  Chief Complaint  Patient presents with  . Rash    right shoulder, itching x 1-2 days, patient denies any pain    HPI: Lindsey Price is a 71 y.o. female who presents to [place] at [facility] today for issues as discussed below.  Patient presents with nonpainful rash to right shoulder\upper back times 3 days.  Noticed it when it started itching approximately 2 days ago.  No dysesthesia.  No known contacts.  No one else in family or close contacts with rash.  No other associated symptoms.   Wt Readings from Last  3 Encounters:  06/08/17 159 lb 11.2 oz (72.4 kg)  06/05/17 160 lb 3.2 oz (72.7 kg)  05/28/17 159 lb (72.1 kg)   BP Readings from Last 3 Encounters:  06/08/17 121/80  06/05/17 121/77  05/28/17 130/70   BMI Readings from Last 3 Encounters:  06/08/17 25.01 kg/m  06/05/17 25.09 kg/m  05/28/17 24.90 kg/m     Patient Care Team    Relationship Specialty Notifications  Start End  Mellody Dance, DO PCP - General Family Medicine  03/12/17   Juanito Doom, MD Consulting Physician Pulmonary Disease  03/12/17   Elsie Saas, MD Consulting Physician Orthopedic Surgery  03/12/17    Comment: L rot cuff repair  Allyn Kenner, MD Consulting Physician Dermatology  03/12/17    Comment: sister with sebacious cell CA  Katy Apo, MD Consulting Physician Ophthalmology  03/12/17   Jovita Kussmaul, MD Consulting Physician General Surgery  03/12/17   Nicholas Lose, MD Consulting Physician Hematology and Oncology  03/12/17   Garlan Fair, MD Consulting Physician Gastroenterology  04/12/17      Patient Active Problem List   Diagnosis Date Noted  . HTN (hypertension) 03/12/2017    Priority: High  . Hyperlipemia, mixed 03/12/2017    Priority: High  . Hypothyroidism 03/12/2017    Priority: Medium  . Adjustment disorder 03/12/2017    Priority: Medium  . GERD (gastroesophageal reflux disease) 03/12/2017    Priority: Medium  . Cough variant asthma 02/23/2015    Priority: Medium  . Cancer of right breast, stage 0 09/11/2011    Priority: Medium  . Vitamin D deficiency 04/12/2017    Priority: Low  . Environmental and seasonal allergies 03/12/2017    Priority: Low  . Family history of multiple cancers 03/12/2017    Priority: Low  . History of tobacco abuse 03/12/2017    Priority: Low  . Insomnia 09/11/2011    Priority: Low  . Osteopenia 04/12/2017  . Cataracts, bilateral- s/p surgery. 03/12/2017  . Allergic rhinitis 03/23/2015  . Arthralgia 09/11/2011    Past Medical history, Surgical history, Family history, Social history, Allergies and Medications have been entered into the medical record, reviewed and changed as needed.    Current Meds  Medication Sig  . albuterol (PROVENTIL HFA;VENTOLIN HFA) 108 (90 Base) MCG/ACT inhaler Inhale 1 puff into the lungs every 6 (six) hours as needed for wheezing or shortness of breath.  . beclomethasone (QVAR REDIHALER) 80  MCG/ACT inhaler Inhale 2 puffs into the lungs 2 (two) times daily.  . cetirizine (ZYRTEC) 10 MG tablet Take 10 mg by mouth daily.  . Cholecalciferol (VITAMIN D3) 5000 units TABS 5,000 IU OTC vitamin D3 daily.  Marland Kitchen esomeprazole (NEXIUM) 20 MG capsule Take 20 mg by mouth daily.   Marland Kitchen ezetimibe (ZETIA) 10 MG tablet Take 1 tablet (10 mg total) by mouth daily.  . famotidine (PEPCID) 20 MG tablet Take 1 tablet (20 mg total) by mouth 2 (two) times daily. (Patient taking differently: Take 20 mg by mouth daily. )  . fluticasone (FLONASE) 50 MCG/ACT nasal spray Place 1 spray into both nostrils daily.  Marland Kitchen levothyroxine (SYNTHROID, LEVOTHROID) 100 MCG tablet 1 tablet every other day alternating with 88 mcg tablet  . levothyroxine (SYNTHROID, LEVOTHROID) 88 MCG tablet 1 tablet every other day alternating with 100 mcg tabs  . losartan-hydrochlorothiazide (HYZAAR) 100-12.5 MG per tablet Take 1 tablet by mouth daily.  . montelukast (SINGULAIR) 10 MG tablet TAKE 1 TABLET BY MOUTH EVERY DAY FOR ALLERGIES & ASTHMA  .  venlafaxine XR (EFFEXOR-XR) 75 MG 24 hr capsule Take 1 capsule (75 mg total) by mouth at bedtime.  . Vitamin D, Ergocalciferol, (DRISDOL) 50000 units CAPS capsule Take one tablet wkly  . [DISCONTINUED] simvastatin (ZOCOR) 10 MG tablet Take 10 mg by mouth at bedtime.    Allergies:  Allergies  Allergen Reactions  . Advair Diskus [Fluticasone-Salmeterol]     Hoarseness.  . Neosporin [Neomycin-Polymyxin-Gramicidin] Itching     Review of Systems: General:   Denies fever, chills, unexplained weight loss.  Optho/Auditory:   Denies visual changes, blurred vision/LOV Respiratory:   Denies wheeze, DOE more than baseline levels.  Cardiovascular:   Denies chest pain, palpitations, new onset peripheral edema  Gastrointestinal:   Denies nausea, vomiting, diarrhea, abd pain.  Genitourinary: Denies dysuria, freq/ urgency, flank pain or discharge from genitals.  Endocrine:     Denies hot or cold intolerance,  polyuria, polydipsia. Musculoskeletal:   Denies unexplained myalgias, joint swelling, unexplained arthralgias, gait problems.  Skin:  Denies new onset rash, suspicious lesions Neurological:     Denies dizziness, unexplained weakness, numbness  Psychiatric/Behavioral:   Denies mood changes, suicidal or homicidal ideations, hallucinations    Objective:   Blood pressure 121/80, pulse 75, height 5\' 7"  (1.702 m), weight 159 lb 11.2 oz (72.4 kg). Body mass index is 25.01 kg/m. General:  Well Developed, well nourished, appropriate for stated age.  Neuro:  Alert and oriented,  extra-ocular muscles intact  HEENT:  Normocephalic, atraumatic, neck supple Skin:  warm, pink.  Rash- see below Cardiac:  RRR, S1 S2 Respiratory:  ECTA B/L and A/P, Not using accessory muscles, speaking in full sentences- unlabored. Vascular:  Ext warm, no cyanosis apprec.; cap RF less 2 sec. Psych:  No HI/SI, judgement and insight good, Euthymic mood. Full Affect. Rash: Erythematous papular rash on the right anterior upper chest, upper back and up into the neck region.  Mostly right side of the body.  No vesicular appearance.  Also a few on the face and down onto her abdomen.

## 2017-06-21 ENCOUNTER — Telehealth: Payer: Self-pay

## 2017-06-21 NOTE — Telephone Encounter (Signed)
Received Epic notification that pt has not read MyChart message regarding results.      This message is to inform you that the patient has not yet read the following message. (Notification date: June 21, 2017)  Recent lab test results   From Fonnie Mu, CMA To [place] 06/07/2017 8:18 AM  Dear [name],   Dr. Raliegh Scarlet asked that I inform you that your recent lab tests were all within normal limits and there is no need for change in your treatment plan.   Please follow up as discussed at your last office visit.   Wishing you well,  Kenney Houseman, [place] Last Read On  Lindsey Price Not Read     LVM for pt to call to discuss results.  Charyl Bigger, CMA

## 2017-06-21 NOTE — Telephone Encounter (Signed)
Pt informed of results.  Pt expressed understanding and is agreeable.  T. Nelson, CMA 

## 2017-08-21 ENCOUNTER — Other Ambulatory Visit: Payer: Self-pay | Admitting: Family Medicine

## 2017-08-21 NOTE — Telephone Encounter (Signed)
Last filled by pervious provider, please review. MPulliam, CMA/RT(R)

## 2017-08-22 NOTE — Telephone Encounter (Signed)
Medication was last filled by a pervious provider, please review. MPulliam, CMA/RT(R)

## 2017-08-22 NOTE — Telephone Encounter (Signed)
When I last see the patient?  When was the last lipid profile?  Has patient been compliant with my f/up recs?

## 2017-08-22 NOTE — Telephone Encounter (Signed)
Patient was last seen on 06/08/2017 and has 6 month follow up on 12/04/2017.  FLP were done 04/09/2017 and were within normal limits. MPulliam, CMA/RT(R)

## 2017-09-06 ENCOUNTER — Encounter: Payer: Self-pay | Admitting: Family Medicine

## 2017-09-06 ENCOUNTER — Ambulatory Visit (INDEPENDENT_AMBULATORY_CARE_PROVIDER_SITE_OTHER): Payer: PPO | Admitting: Family Medicine

## 2017-09-06 VITALS — BP 115/74 | HR 89 | Ht 67.0 in | Wt 157.0 lb

## 2017-09-06 DIAGNOSIS — R05 Cough: Secondary | ICD-10-CM

## 2017-09-06 DIAGNOSIS — J329 Chronic sinusitis, unspecified: Secondary | ICD-10-CM

## 2017-09-06 DIAGNOSIS — J310 Chronic rhinitis: Secondary | ICD-10-CM

## 2017-09-06 DIAGNOSIS — R059 Cough, unspecified: Secondary | ICD-10-CM

## 2017-09-06 MED ORDER — HYDROCOD POLST-CPM POLST ER 10-8 MG/5ML PO SUER: 5.0000 mL | Freq: Two times a day (BID) | ORAL | 0 refills | Status: DC | PRN

## 2017-09-06 NOTE — Patient Instructions (Signed)
Please let me know if your symptoms are not improving by Monday, because if they are not we will call in a prescription of Augmentin for you.    Please take your tussionex for cough as needed.   Please take the over-the-counter medications as we discussed and if you opt to take the ones that are decongestant please monitor your blood pressure.  Use your sinus rinses twice daily.  Push fluids of water.

## 2017-09-06 NOTE — Progress Notes (Signed)
Pt here for an acute care OV today   Impression and Recommendations:    1. Rhinosinusitis   2. Cough    1. Rhinosinusitis: If symptoms persist or worsen after 7 days of symptom onset, call office and Augmentin will be prescribed. -Recommended use Neti pot 2x/day, daily.   -Drink plenty of fluids and get plenty of rest.  -Take tylenol cold and sinus and ibuprofen for pain.  -Continue checking BP at home and monitor to ensure these medications do not increase your BP. 2. Take medications as prescribed below.    Education and routine counseling performed. Handouts provided  Gross side effects, risk and benefits, and alternatives of medications and treatment plan in general discussed with patient.  Patient is aware that all medications have potential side effects and we are unable to predict every side effect or drug-drug interaction that may occur.   Patient will call with any questions prior to using medication if they have concerns.  Expresses verbal understanding and consents to current therapy and treatment regimen.  No barriers to understanding were identified.  Red flag symptoms and signs discussed in detail.  Patient expressed understanding regarding what to do in case of emergency\urgent symptoms   Please see AVS handed out to patient at the end of our visit for further patient instructions/ counseling done pertaining to today's office visit.   Return if symptoms worsen or fail to improve.     Note: This document was prepared using Dragon voice recognition software and may include unintentional dictation errors.  This document serves as a record of services personally performed by Mellody Dance, DO. It was created on her behalf by Mayer Masker, a trained medical scribe. The creation of this record is based on the scribe's personal observations and the provider's statements to them.   I have reviewed medical documentation per the transcriptionist for accuracy and concur.    Mellody Dance 09/08/17 4:39 PM   --------------------------------------------------------------------------------------------------------------------------------------------------------------------------------------------------------------------------------------------    Subjective:    CC: Cough Chief Complaint  Patient presents with  . Cough    productive cough, headaches x 5 days    HPI: Lindsey Price is a 72 y.o. female who presents to [place] at [facility] today for issues as discussed below.   Cough: Pt states her symptoms began with sneezing that began 5 days ago that has gradually worsened over time. She has associated post-nasal drip, HA, congestion, and cough. Pt has tried taking Claritin D for two days, but was worried about it interfering with her BP medications, as well as Mucinex Cold and Flu with mild to no relief. She denies wheezing, SOB, fever, one-sided facial pain, sinus pains, and dental pain. Pt denies any sick contacts.     Wt Readings from Last 3 Encounters:  09/06/17 157 lb (71.2 kg)  06/08/17 159 lb 11.2 oz (72.4 kg)  06/05/17 160 lb 3.2 oz (72.7 kg)   BP Readings from Last 3 Encounters:  09/06/17 115/74  06/08/17 121/80  06/05/17 121/77   BMI Readings from Last 3 Encounters:  09/06/17 24.59 kg/m  06/08/17 25.01 kg/m  06/05/17 25.09 kg/m     Patient Care Team    Relationship Specialty Notifications Start End  Mellody Dance, DO PCP - General Family Medicine  03/12/17   Juanito Doom, MD Consulting Physician Pulmonary Disease  03/12/17   Elsie Saas, MD Consulting Physician Orthopedic Surgery  03/12/17    Comment: L rot cuff repair  Allyn Kenner, MD  Consulting Physician Dermatology  03/12/17    Comment: sister with sebacious cell CA  Katy Apo, MD Consulting Physician Ophthalmology  03/12/17   Jovita Kussmaul, MD Consulting Physician General Surgery  03/12/17   Nicholas Lose, [place] Physician Hematology and  Oncology  03/12/17   Garlan Fair, MD Consulting Physician Gastroenterology  04/12/17      Patient Active Problem List   Diagnosis Date Noted  . HTN (hypertension) 03/12/2017    Priority: High  . Hyperlipemia, mixed 03/12/2017    Priority: High  . Hypothyroidism 03/12/2017    Priority: Medium  . Adjustment disorder 03/12/2017    Priority: Medium  . GERD (gastroesophageal reflux disease) 03/12/2017    Priority: Medium  . Cough variant asthma 02/23/2015    Priority: Medium  . Cancer of right breast, stage 0 09/11/2011    Priority: Medium  . Vitamin D deficiency 04/12/2017    Priority: Low  . Environmental and seasonal allergies 03/12/2017    Priority: Low  . Family history of multiple cancers 03/12/2017    Priority: Low  . History of tobacco abuse 03/12/2017    Priority: Low  . Insomnia 09/11/2011    Priority: Low  . Osteopenia 04/12/2017  . Cataracts, bilateral- s/p surgery. 03/12/2017  . Allergic rhinitis 03/23/2015  . Arthralgia 09/11/2011    Past Medical history, Surgical history, Family history, Social history, Allergies and Medications have been entered into the medical record, reviewed and changed as needed.    Current Meds  Medication Sig  . albuterol (PROVENTIL HFA;VENTOLIN HFA) 108 (90 Base) MCG/ACT inhaler Inhale 1 puff into the lungs every 6 (six) hours as needed for wheezing or shortness of breath.  . beclomethasone (QVAR REDIHALER) 80 MCG/ACT inhaler Inhale 2 puffs into the lungs 2 (two) times daily.  . cetirizine (ZYRTEC) 10 MG tablet Take 10 mg by mouth daily.  . Cholecalciferol (VITAMIN D3) 5000 units TABS 5,000 IU OTC vitamin D3 daily.  Marland Kitchen esomeprazole (NEXIUM) 20 MG capsule Take 20 mg by mouth daily.   Marland Kitchen ezetimibe (ZETIA) 10 MG tablet Take 1 tablet (10 mg total) by mouth daily.  . famotidine (PEPCID) 20 MG tablet Take 1 tablet (20 mg total) by mouth 2 (two) times daily. (Patient taking differently: Take 20 mg by mouth daily. )  . fluticasone (FLONASE)  50 MCG/ACT nasal spray Place 1 spray into both nostrils daily.  Marland Kitchen levothyroxine (SYNTHROID, LEVOTHROID) 100 MCG tablet 1 tablet every other day alternating with 88 mcg tablet  . levothyroxine (SYNTHROID, LEVOTHROID) 88 MCG tablet 1 tablet every other day alternating with 100 mcg tabs  . losartan-hydrochlorothiazide (HYZAAR) 100-12.5 MG per tablet Take 1 tablet by mouth daily.  . montelukast (SINGULAIR) 10 MG tablet TAKE 1 TABLET BY MOUTH EVERY DAY FOR ALLERGIES & ASTHMA  . ranitidine (ZANTAC) 150 MG tablet Take 1 tablet (150 mg total) by mouth 2 (two) times daily. For help with itch associated rash; along with Benadryl  . simvastatin (ZOCOR) 10 MG tablet TAKE 1 TABLET BY MOUTH EVERYDAY AT BEDTIME  . triamcinolone cream (KENALOG) 0.1 % Apply 1 application topically 2 (two) times daily.  . valACYclovir (VALTREX) 1000 MG tablet Take 1 tablet (1,000 mg total) by mouth 3 (three) times daily.  Marland Kitchen venlafaxine XR (EFFEXOR-XR) 75 MG 24 hr capsule Take 1 capsule (75 mg total) by mouth at bedtime.  . Vitamin D, Ergocalciferol, (DRISDOL) 50000 units CAPS capsule Take one tablet wkly    Allergies:  Allergies  Allergen Reactions  .  Advair Diskus [Fluticasone-Salmeterol]     Hoarseness.  . Neosporin [Neomycin-Polymyxin-Gramicidin] Itching     Review of Systems: General:   Denies fever, chills, unexplained weight loss.  Optho/Auditory:   Denies visual changes, blurred vision/LOV Respiratory:   Denies wheeze, DOE more than baseline levels.  Cardiovascular:   Denies chest pain, palpitations, new onset peripheral edema  Gastrointestinal:   Denies nausea, vomiting, diarrhea, abd pain.  Genitourinary: Denies dysuria, freq/ urgency, flank pain or discharge from genitals.  Endocrine:     Denies hot or cold intolerance, polyuria, polydipsia. Musculoskeletal:   Denies unexplained myalgias, joint swelling, unexplained arthralgias, gait problems.  Skin:  Denies new onset rash, suspicious lesions Neurological:      Denies dizziness, unexplained weakness, numbness  Psychiatric/Behavioral:   Denies mood changes, suicidal or homicidal ideations, hallucinations    Objective:   Blood pressure 115/74, pulse 89, height 5\' 7"  (1.702 m), weight 157 lb (71.2 kg), SpO2 98 %. Body mass index is 24.59 kg/m. General:  Well Developed, well nourished, appropriate for stated age.  Neuro:  Alert and oriented,  extra-ocular muscles intact  HEENT:  Normocephalic, atraumatic, neck supple, oropharynx is erythematous, nares: erythematous and thick yellowish discharge Skin:  no gross rash, warm, pink. Cardiac:  RRR, S1 S2 Respiratory:  ECTA B/L and A/P, Not using accessory muscles, speaking in full sentences- unlabored. Vascular:  Ext warm, no cyanosis apprec.; cap RF less 2 sec. Psych:  No HI/SI, judgement and insight good, Euthymic mood. Full Affect.

## 2017-09-08 ENCOUNTER — Encounter: Payer: Self-pay | Admitting: Family Medicine

## 2017-09-20 ENCOUNTER — Ambulatory Visit
Admission: RE | Admit: 2017-09-20 | Discharge: 2017-09-20 | Disposition: A | Discharge status: Home / Self Care | Payer: PPO | Source: Ambulatory Visit | Attending: Adult Health | Admitting: Adult Health

## 2017-09-20 DIAGNOSIS — Z1239 Encounter for other screening for malignant neoplasm of breast: Secondary | ICD-10-CM

## 2017-09-20 DIAGNOSIS — R928 Other abnormal and inconclusive findings on diagnostic imaging of breast: Secondary | ICD-10-CM | POA: Diagnosis not present

## 2017-11-17 ENCOUNTER — Other Ambulatory Visit: Payer: Self-pay | Admitting: Family Medicine

## 2017-11-27 DIAGNOSIS — L570 Actinic keratosis: Secondary | ICD-10-CM | POA: Diagnosis not present

## 2017-11-27 DIAGNOSIS — B0089 Other herpesviral infection: Secondary | ICD-10-CM | POA: Diagnosis not present

## 2017-11-27 DIAGNOSIS — X32XXXD Exposure to sunlight, subsequent encounter: Secondary | ICD-10-CM | POA: Diagnosis not present

## 2017-12-04 ENCOUNTER — Ambulatory Visit: Payer: Self-pay | Admitting: Family Medicine

## 2017-12-13 ENCOUNTER — Other Ambulatory Visit (INDEPENDENT_AMBULATORY_CARE_PROVIDER_SITE_OTHER): Payer: PPO

## 2017-12-13 DIAGNOSIS — E559 Vitamin D deficiency, unspecified: Secondary | ICD-10-CM

## 2017-12-13 DIAGNOSIS — R7401 Elevation of levels of liver transaminase levels: Secondary | ICD-10-CM

## 2017-12-13 DIAGNOSIS — R740 Nonspecific elevation of levels of transaminase and lactic acid dehydrogenase [LDH]: Secondary | ICD-10-CM

## 2017-12-14 LAB — ALT: ALT: 20 IU/L (ref 0–32)

## 2017-12-14 LAB — VITAMIN D 25 HYDROXY (VIT D DEFICIENCY, FRACTURES): Vit D, 25-Hydroxy: 83.7 ng/mL (ref 30.0–100.0)

## 2017-12-17 ENCOUNTER — Encounter: Payer: Self-pay | Admitting: Family Medicine

## 2017-12-17 ENCOUNTER — Ambulatory Visit (INDEPENDENT_AMBULATORY_CARE_PROVIDER_SITE_OTHER): Payer: PPO | Admitting: Family Medicine

## 2017-12-17 VITALS — BP 105/72 | HR 87 | Ht 67.0 in | Wt 146.0 lb

## 2017-12-17 DIAGNOSIS — M8580 Other specified disorders of bone density and structure, unspecified site: Secondary | ICD-10-CM | POA: Diagnosis not present

## 2017-12-17 DIAGNOSIS — F4320 Adjustment disorder, unspecified: Secondary | ICD-10-CM | POA: Diagnosis not present

## 2017-12-17 DIAGNOSIS — Z23 Encounter for immunization: Secondary | ICD-10-CM | POA: Diagnosis not present

## 2017-12-17 DIAGNOSIS — I10 Essential (primary) hypertension: Secondary | ICD-10-CM | POA: Diagnosis not present

## 2017-12-17 DIAGNOSIS — E782 Mixed hyperlipidemia: Secondary | ICD-10-CM

## 2017-12-17 DIAGNOSIS — E039 Hypothyroidism, unspecified: Secondary | ICD-10-CM | POA: Diagnosis not present

## 2017-12-17 DIAGNOSIS — E559 Vitamin D deficiency, unspecified: Secondary | ICD-10-CM

## 2017-12-17 DIAGNOSIS — J4530 Mild persistent asthma, uncomplicated: Secondary | ICD-10-CM | POA: Diagnosis not present

## 2017-12-17 DIAGNOSIS — J45909 Unspecified asthma, uncomplicated: Secondary | ICD-10-CM | POA: Insufficient documentation

## 2017-12-17 MED ORDER — ZOSTER VAC RECOMB ADJUVANTED 50 MCG/0.5ML IM SUSR: 0.5000 mL | Freq: Once | INTRAMUSCULAR | 0 refills | Status: AC

## 2017-12-17 NOTE — Patient Instructions (Addendum)
Advised patient to begin regular weight-bearing exercises.    Continue with an hour of cardio daily, but add in walking.  Aim for an hour of elliptical one day, and an hour of brisk walking the next.  Intersperse weight bearing exercises as often as possible.  -We are cutting your blood pressure medicine in half.  Please take 1/2 tablet daily.  Please check your blood pressure at home and follow-up sooner than planned if it does not remain under well control of less than 140/90 on a regular basis.  Also okay to stop your daily vitamin D but continue the once weekly.

## 2017-12-17 NOTE — Progress Notes (Signed)
Impression and Recommendations:    1. Essential hypertension   2. Hyperlipemia, mixed   3. Acquired hypothyroidism   4. Vitamin D deficiency   5. Osteopenia, unspecified location   6. Adjustment disorder, unspecified type   7. Mild persistent reactive airway disease without complication   8. Need for shingles vaccine    Recent labs reviewed with the patient today (Vitamin D and ALT).  1. HTN - Will reduce dose of BP med to half-tablet of Hyzaar PO daily   A higher to perfuse all your since BP is in the low normal range.  Patient completely asymptomatic.  - Reviewed goal as less than 140/90 on a regular basis.  - Patient agrees to check her blood pressure about every other day.  If 4-6 weeks go by and it's running over goal she will notify us and we will go back to 1 tab daily   2. HLD - Continue taking Zetia and Zocor every night as prescribed.  Liver Enzymes - ALT - Normalized-we will continue to monitor   3. Vitamin D - Vitamin D went from 15 to 93. -Since well controlled patient will stop the 5000 once daily and just take her once weekly.  We will continue to monitor in 4-6 months or next time we draw blood   4. Screenings DEXA - Next bone density will be due September of 2020.   5. Seasonal Allergies/ Asthma - Continue taking Singulair every day. -During allergy season or times when her asthma typically gets worse she will restart taking Qvar daily.  Since doing well continue to hold for now. -Understands use of albuterol as rescue inhaler vs Qvar as daily chronic med per her pulmonologist   6. Exercise & Health Maintenance - Advised patient to begin weight-bearing exercises.  She may continue on elliptical daily for an hour, but add in brisk walking for an hour as often as possible.  She will aim for an hour of elliptical one day, and an hour of walking the next.  She will intersperse weight-bearing exercise for bone health.  - Advised patient to continue  exercising to improve health.  Recommended that the patient eventually strive for at least 150 minutes of cardio per week according to guidelines established by the [facility].   - Continue reading daily and if she wishes to take care of her brain, take up a new instrument or a new language.  - Healthy dietary habits encouraged, including low-carb, and high amounts of lean protein in diet.   - Patient should also consume adequate amounts of water - half of body weight in oz of water per day   Education and routine counseling performed. Handouts provided.  7. Follow-Up - Return in 4 months. - Will obtain full lab work at that time. - Patient will monitor her blood pressure in the meantime and call in sooner if there are any issues.  Orders Placed This Encounter  Procedures  . CBC with Differential/Platelet  . Comprehensive metabolic panel  . Lipid panel  . Hemoglobin A1c  . TSH  . T4, free  . VITAMIN D 25 Hydroxy (Vit-D Deficiency, Fractures)    Meds ordered this encounter  Medications  . Zoster Vaccine Adjuvanted [facility]) injection    Sig: Inject 0.5 mLs into the muscle once for 1 dose.    Dispense:  0.5 mL    Refill:  0   The patient was counseled, risk factors were discussed, anticipatory guidance given.  Gross side  effects, risk and benefits, and alternatives of medications discussed with patient.  Patient is aware that all medications have potential side effects and we are unable to predict every side effect or drug-drug interaction that may occur.  Expresses verbal understanding and consents to current therapy plan and treatment regimen.  Return in about 4 months (around 04/18/2018) for Chronic OV w me 4moin future & FBW 4-5d prior.  Please see AVS handed out to patient at the end of our visit for further patient instructions/ counseling done pertaining to today's office visit.    Note: This document was prepared using Dragon voice recognition software and may include  unintentional dictation errors.    This document serves as a record of services personally performed by DMellody Dance DO. It was created on her behalf by KToni Amend a trained medical scribe. The creation of this record is based on the scribe's personal observations and the provider's statements to them.   I have reviewed the above medical documentation for accuracy and completeness and I concur.  DMellody Dance04/15/19 6:36 PM    Subjective:    HPI: Lindsey SCHABENis a 72y.o. female [facility]    Patient notes that her mood is good all the time.  Exercises for an hour each day on the elliptical and uses free weights.  Had rotator cuff surgery, so she's very careful with her left shoulder.  Vitamin D Continues once weekly Vitamin D, as well as daily.  Will cut down to weekly Vitamin D only moving forward.  Seasonal Allergies Denies current issues with seasonal allergies.  Was told that she has cough induced asthma, and doesn't take her allergy/asthma medications unless she needs it.  Takes Singulair every day.  Liver Enzymes Patient notes that she knows she has a sensitive liver, ever since her history of breast cancer in 2011. Notes that the tamoxifen messed up her liver and ever since she's had issues with enzymes from time to time.  History of Breast Cancer She continues obtaining yearly mammograms and keeping her health maintenance up to date.  HTN:  -  Her blood pressure has been controlled at home.   Notes that at home, her top number is always low.  On Friday, 120/70.  - Patient reports good compliance with blood pressure medications.  - Denies medication S-E   - Smoking Status noted   - She denies new onset of: chest pain, exercise intolerance, shortness of breath, dizziness, visual changes, headache, lower extremity swelling or claudication.    Last 3 blood pressure  readings in our office are as follows: BP Readings from Last 3 Encounters:  12/17/17 105/72  09/06/17 115/74  06/08/17 121/80    Pulse Readings from Last 3 Encounters:  12/17/17 87  09/06/17 89  06/08/17 75    Filed Weights   12/17/17 1034  Weight: 146 lb (66.2 kg)   1. 72y.o. female here for cholesterol follow-up.   - Patient reports good compliance with medications or treatment plan. - Continues taking Zetia and Zocor every night, doing well on these.  - Denies medication S-E   - Smoking Status noted   - She denies new onset of: chest pain, exercise intolerance, shortness of breath, dizziness, visual changes, headache, lower extremity swelling or claudication.   Denies myalgia.  The cholesterol last visit was:  Lab Results  Component Value Date   CHOL 146  04/09/2017   HDL 45 04/09/2017   LDLCALC 83 04/09/2017   TRIG 88 04/09/2017   CHOLHDL 3.2 04/09/2017    Hepatic Function Latest Ref Rng & Units 12/13/2017 04/09/2017 05/15/2014  Total Protein 6.0 - 8.5 g/dL - 6.2 6.6  Albumin 3.5 - 4.8 g/dL - 3.9 3.6  AST 0 - 40 IU/L - 30 31  ALT 0 - 32 IU/L 20 33(H) 27  Alk Phosphatase 39 - 117 IU/L - 86 129  Total Bilirubin 0.0 - 1.2 mg/dL - 0.4 0.66      Patient Care Team    Relationship Specialty Notifications Start End  Mellody Dance, DO PCP - General Family Medicine  03/12/17   Juanito Doom, MD Consulting Physician Pulmonary Disease  03/12/17   Elsie Saas, MD Consulting Physician Orthopedic Surgery  03/12/17    Comment: L rot cuff repair  Allyn Kenner, MD Consulting Physician Dermatology  03/12/17    Comment: sister with sebacious cell CA  Katy Apo, MD Consulting Physician Ophthalmology  03/12/17   Jovita Kussmaul, MD Consulting Physician General Surgery  03/12/17   Nicholas Lose, MD Consulting Physician Hematology and Oncology  03/12/17   Garlan Fair, MD Consulting Physician Gastroenterology  04/12/17      Lab Results  Component Value Date   CREATININE  0.82 04/09/2017   BUN 19 04/09/2017   NA 143 04/09/2017   K 3.8 04/09/2017   CL 103 04/09/2017   CO2 24 04/09/2017    Lab Results  Component Value Date   CHOL 146 04/09/2017   CHOL 141 05/15/2014   CHOL 150 11/10/2013    Lab Results  Component Value Date   HDL 45 04/09/2017   HDL 46 05/15/2014   HDL 44 11/10/2013    Lab Results  Component Value Date   LDLCALC 83 04/09/2017   LDLCALC 76 05/15/2014   LDLCALC 88 11/10/2013    Lab Results  Component Value Date   TRIG 88 04/09/2017   TRIG 96 05/15/2014   TRIG 91 11/10/2013    Lab Results  Component Value Date   CHOLHDL 3.2 04/09/2017   CHOLHDL 3.1 05/15/2014   CHOLHDL 3.4 11/10/2013    No results found for: LDLDIRECT ===================================================================   Patient Active Problem List   Diagnosis Date Noted  . HTN (hypertension) 03/12/2017    Priority: High  . Hyperlipemia, mixed 03/12/2017    Priority: High  . Hypothyroidism 03/12/2017    Priority: Medium  . Adjustment disorder 03/12/2017    Priority: Medium  . GERD (gastroesophageal reflux disease) 03/12/2017    Priority: Medium  . Cough variant asthma 02/23/2015    Priority: Medium  . Cancer of right breast, stage 0 09/11/2011    Priority: Medium  . Vitamin D deficiency 04/12/2017    Priority: Low  . Environmental and seasonal allergies 03/12/2017    Priority: Low  . Family history of multiple cancers 03/12/2017    Priority: Low  . History of tobacco abuse 03/12/2017    Priority: Low  . Insomnia 09/11/2011    Priority: Low  . Reactive airway disease 12/17/2017  . Osteopenia 04/12/2017  . Cataracts, bilateral- s/p surgery. 03/12/2017  . Allergic rhinitis 03/23/2015  . Arthralgia 09/11/2011     Past Medical History:  Diagnosis Date  . Arthralgia 09/11/2011  . Asthma   . Breast cancer ([place])   . 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 Hyperlipidemia   . Hypertension   . Insomnia   .  Thyroid  disease    hypothyroidism     Past Surgical History:  Procedure Laterality Date  . ABDOMINAL HYSTERECTOMY  1975   partial  . BREAST LUMPECTOMY  2011   right breast  . COLONOSCOPY WITH PROPOFOL N/A 04/14/2014   Procedure: COLONOSCOPY WITH PROPOFOL;  Surgeon: Garlan Fair, MD;  Location: [facility];  Service: Endoscopy;  Laterality: N/A;  . ROTATOR CUFF REPAIR Left 04/2013   Dr. Para March     Family History  Problem Relation Age of Onset  . Cancer Mother        BREAST  . Cancer Father        pancreatic  . Cancer Sister        sebacous cell carcinoma  . Cancer Maternal Aunt        lung  . Cancer Paternal Uncle        colon, stomach     Social History   Substance and Sexual Activity  Drug Use No  ,  Social History   Substance and Sexual Activity  Alcohol Use Yes  . Alcohol/week: 0.0 oz   Comment: SOCIALLY wine  ,  Social History   Tobacco Use  Smoking Status Former Smoker  . Packs/day: 1.00  . Years: 15.00  . Pack years: 15.00  . Types: Cigarettes  . Last attempt to quit: 12/09/1978  . Years since quitting: 39.0  Smokeless Tobacco Former Systems developer  . Quit date: 09/04/1978  ,    Current Outpatient Medications on File Prior to Visit  Medication Sig Dispense Refill  . albuterol (PROVENTIL HFA;VENTOLIN HFA) 108 (90 Base) MCG/ACT inhaler Inhale 1 puff into the lungs every 6 (six) hours as needed for wheezing or shortness of breath. 18 g 11  . beclomethasone (QVAR REDIHALER) 80 MCG/ACT inhaler Inhale 2 puffs into the lungs 2 (two) times daily. 1 Inhaler 11  . cetirizine (ZYRTEC) 10 MG tablet Take 10 mg by mouth daily.    . chlorpheniramine-HYDROcodone (TUSSIONEX) 10-8 MG/5ML SUER Take 5 mLs by mouth every 12 (twelve) hours as needed for cough (cough, will cause drowsiness.). 200 mL 0  . ezetimibe (ZETIA) 10 MG tablet Take 1 tablet (10 mg total) by mouth daily.    . fluticasone (FLONASE) 50 MCG/ACT nasal spray Place 1 spray into both nostrils daily. 16 g 11  .  levothyroxine (SYNTHROID, LEVOTHROID) 100 MCG tablet 1 tablet every other day alternating with 88 mcg tablet 45 tablet 3  . levothyroxine (SYNTHROID, LEVOTHROID) 88 MCG tablet 1 tablet every other day alternating with 100 mcg tabs 45 tablet 3  . losartan-hydrochlorothiazide (HYZAAR) 100-12.5 MG per tablet Take 0.5 tablets by mouth daily.    . montelukast (SINGULAIR) 10 MG tablet TAKE 1 TABLET BY MOUTH EVERY DAY FOR ALLERGIES & ASTHMA 90 tablet 1  . simvastatin (ZOCOR) 10 MG tablet TAKE 1 TABLET BY MOUTH EVERYDAY AT BEDTIME 90 tablet 1  . valACYclovir (VALTREX) 1000 MG tablet Take 1 tablet (1,000 mg total) by mouth 3 (three) times daily. 30 tablet 0  . venlafaxine XR (EFFEXOR-XR) 75 MG 24 hr capsule Take 1 capsule (75 mg total) by mouth at bedtime. 90 capsule 3  . Vitamin D, Ergocalciferol, (DRISDOL) 50000 units CAPS capsule Take one tablet wkly 12 capsule 10  . triamcinolone cream (KENALOG) 0.1 % Apply 1 application topically 2 (two) times daily. 453.6 g 0   No current facility-administered medications on file prior to visit.      Allergies  Allergen Reactions  . Advair Diskus [  Fluticasone-Salmeterol]     Hoarseness.  . Neosporin [Neomycin-Polymyxin-Gramicidin] Itching     Review of Systems:   General:  Denies fever, chills Optho/Auditory:   Denies visual changes, blurred vision Respiratory:   Denies SOB, cough, wheeze, DIB  Cardiovascular:   Denies chest pain, palpitations, painful respirations Gastrointestinal:   Denies nausea, vomiting, diarrhea.  Endocrine:     Denies new hot or cold intolerance Musculoskeletal:  Denies joint swelling, gait issues, or new unexplained myalgias/ arthralgias Skin:  Denies rash, suspicious lesions  Neurological:    Denies dizziness, unexplained weakness, numbness  Psychiatric/Behavioral:   Denies mood changes  Objective:    Blood pressure 105/72, pulse 87, height _0  (1.702 m), weight 146 lb (66.2 kg), SpO2 96 %.  Body mass index is 22.87  kg/m.  General: Well Developed, well nourished, and in no acute distress.  HEENT: Normocephalic, atraumatic, pupils equal round reactive to light, neck supple, No carotid bruits, no JVD Skin: Warm and dry, cap RF less 2 sec Cardiac: Regular rate and rhythm, S1, S2 WNL's, no murmurs rubs or gallops Respiratory: ECTA B/L, Not using accessory muscles, speaking in full sentences. NeuroM-Sk: Ambulates w/o assistance, moves ext * 4 w/o difficulty, sensation grossly intact.  Ext: scant edema b/l lower ext Psych: No HI/SI, judgement and insight good, Euthymic mood. Full Affect.

## 2018-01-08 DIAGNOSIS — B0089 Other herpesviral infection: Secondary | ICD-10-CM | POA: Diagnosis not present

## 2018-01-29 ENCOUNTER — Ambulatory Visit: Payer: PPO

## 2018-01-30 DIAGNOSIS — D0511 Intraductal carcinoma in situ of right breast: Secondary | ICD-10-CM | POA: Diagnosis not present

## 2018-02-11 ENCOUNTER — Encounter: Payer: Self-pay | Admitting: Family Medicine

## 2018-02-11 ENCOUNTER — Ambulatory Visit (INDEPENDENT_AMBULATORY_CARE_PROVIDER_SITE_OTHER): Payer: PPO | Admitting: Family Medicine

## 2018-02-11 VITALS — BP 137/82 | HR 87 | Ht 67.0 in | Wt 143.6 lb

## 2018-02-11 DIAGNOSIS — I10 Essential (primary) hypertension: Secondary | ICD-10-CM

## 2018-02-11 MED ORDER — LOSARTAN POTASSIUM 50 MG PO TABS: 50.0000 mg | ORAL_TABLET | Freq: Every day | ORAL | 3 refills | Status: DC

## 2018-02-11 NOTE — Patient Instructions (Signed)
Please be checking your blood pressure at home after sitting quietly for 15 to 20 minutes.  Please see below for further details.  If your blood pressures running in the 120s-130s\60s-70s with the new medication change of 50 mg losartan, and do not change the dose.  Take it as prescribed on the bottle. -However if your blood pressure is running low then please take one half tab of the losartan every morning and continue to monitor your blood pressure.  We will see you at your follow-up office visit or please contact us sooner if you have any questions or concerns.   How to Take Your Blood Pressure Blood pressure is a measurement of how strongly your blood is pressing against the walls of your arteries. Arteries are blood vessels that carry blood from your heart throughout your body. Your health care provider takes your blood pressure at each office visit. You can also take your own blood pressure at home with a blood pressure machine. You may need to take your own blood pressure:  To confirm a diagnosis of high blood pressure (hypertension).  To monitor your blood pressure over time.  To make sure your blood pressure medicine is working.  Supplies needed: To take your blood pressure, you will need a blood pressure machine. You can buy a blood pressure machine, or blood pressure monitor, at most drugstores or online. There are several types of home blood pressure monitors. When choosing one, consider the following:  Choose a monitor that has an arm cuff.  Choose a monitor that wraps snugly around your upper arm. You should be able to fit only one finger between your arm and the cuff.  Do not choose a monitor that measures your blood pressure from your wrist or finger.  Your health care provider can suggest a reliable monitor that will meet your needs. How to prepare To get the most accurate reading, avoid the following for 30 minutes before you check your blood pressure:  Drinking  caffeine.  Drinking alcohol.  Eating.  Smoking.  Exercising.  Five minutes before you check your blood pressure:  Empty your bladder.  Sit quietly without talking in a dining chair, rather than in a soft couch or armchair.  How to take your blood pressure To check your blood pressure, follow the instructions in the manual that came with your blood pressure monitor. If you have a digital blood pressure monitor, the instructions may be as follows: 1. Sit up straight. 2. Place your feet on the floor. Do not cross your ankles or legs. 3. Rest your left arm at the level of your heart on a table or desk or on the arm of a chair. 4. Pull up your shirt sleeve. 5. Wrap the blood pressure cuff around the upper part of your left arm, 1 inch (2.5 cm) above your elbow. It is best to wrap the cuff around bare skin. 6. Fit the cuff snugly around your arm. You should be able to place only one finger between the cuff and your arm. 7. Position the cord inside the groove of your elbow. 8. Press the power button. 9. Sit quietly while the cuff inflates and deflates. 10. Read the digital reading on the monitor screen and write it down (record it). 11. Wait 2-3 minutes, then repeat the steps, starting at step 1.  What does my blood pressure reading mean? A blood pressure reading consists of a higher number over a lower number. Ideally, your blood pressure should be  below 120/80. The first ("top") number is called the systolic pressure. It is a measure of the pressure in your arteries as your heart beats. The second ("bottom") number is called the diastolic pressure. It is a measure of the pressure in your arteries as the heart relaxes. Blood pressure is classified into four stages. The following are the stages for adults who do not have a short-term serious illness or a chronic condition. Systolic pressure and diastolic pressure are measured in a unit called mm Hg. Normal  Systolic pressure: below  120.  Diastolic pressure: below 80. Elevated  Systolic pressure: 629-528.  Diastolic pressure: below 80. Hypertension stage 1  Systolic pressure: 413-244.  Diastolic pressure: 01-02. Hypertension stage 2  Systolic pressure: 725 or above.  Diastolic pressure: 90 or above. You can have prehypertension or hypertension even if only the systolic or only the diastolic number in your reading is higher than normal. Follow these instructions at home:  Check your blood pressure as often as recommended by your health care provider.  Take your monitor to the next appointment with your health care provider to make sure: ? That you are using it correctly. ? That it provides accurate readings.  Be sure you understand what your goal blood pressure numbers are.  Tell your health care provider if you are having any side effects from blood pressure medicine. Contact a health care provider if:  Your blood pressure is consistently high. Get help right away if:  Your systolic blood pressure is higher than 180.  Your diastolic blood pressure is higher than 110. This information is not intended to replace advice given to you by your health care provider. Make sure you discuss any questions you have with your health care provider. Document Released: 01/28/2016 Document Revised: 04/11/2016 Document Reviewed: 01/28/2016 Elsevier Interactive Patient Education  [employer].

## 2018-02-11 NOTE — Progress Notes (Signed)
Impression and Recommendations:    1. Essential hypertension     1. HTN -DC hctz. Continue 50mg  losartan as written below. If BP remains low, pt understands she can further lower her losartan dose from 50 mg to 25 mg qd.  -pt is completely asymptomatic at this time.  -BP under great control at this time.  -rest for 15 minutes before checking y -check your BP at home and keep a log. Bring this into next OV.  -Goal BP: < 130s/70s  -Pt is down 14 lb since Jan 2019.   Meds ordered this encounter  Medications  . losartan (COZAAR) 50 MG tablet    Sig: Take 1 tablet (50 mg total) by mouth daily.    Dispense:  90 tablet    Refill:  3    Gross side effects, risk and benefits, and alternatives of medications and treatment plan in general discussed with patient.  Patient is aware that all medications have potential side effects and we are unable to predict every side effect or drug-drug interaction that may occur.   Patient will call with any questions prior to using medication if they have concerns.  Expresses verbal understanding and consents to current therapy and treatment regimen.  No barriers to understanding were identified.  Red flag symptoms and signs discussed in detail.  Patient expressed understanding regarding what to do in case of emergency\urgent symptoms  Please see AVS handed out to patient at the end of our visit for further patient instructions/ counseling done pertaining to today's office visit.   Return for Keep your follow-up OV in the near future.    Note: This note was prepared with assistance of Dragon voice recognition software. Occasional wrong-word or sound-a-like substitutions may have occurred due to the inherent limitations of voice recognition software.  This document serves as a record of services personally performed by Mellody Dance, DO. It was created on her behalf by Mayer Masker, a trained medical scribe. The creation of this record is based on  the scribe's personal observations and the provider's statements to them.   I have reviewed the above medical documentation for accuracy and completeness and I concur.  Mellody Dance 02/12/18 1:21 PM  --------------------------------------------------------------------------------------------------------------------------------------------------------------------------------------------------------------------------------------------    Subjective:     HPI: Lindsey Price is a 72 y.o. female who presents to [place] [facility] today for issues as discussed below.  She has been checking her BP at home and it has been: 131/75  She has been resting for 5 minutes before checking her BP. She does not eat/drink before checking it.   She has since bought a new BP cuff and is checking to see if her cuff is similar to ours in office (she is worried her BP cuff at home is reading too low). When we checked on our machines vs when she checked on hers, they were similar, within 10 of each other.   She denies dizziness, lightheadedness, and any sx related to low BP.   She is down 14 lb since Jan 2019.  She has been exercising with the treadmill, 5-6 days a week. She has eaten well.     Wt Readings from Last 3 Encounters:  02/11/18 143 lb 9.6 oz (65.1 kg)  12/17/17 146 lb (66.2 kg)  09/06/17 157 lb (71.2 kg)   BP Readings from Last 3 Encounters:  02/11/18 137/82  12/17/17 105/72  09/06/17 115/74   Pulse Readings from Last 3 Encounters:  02/11/18 87  12/17/17 87  09/06/17 89   BMI Readings from Last 3 Encounters:  02/11/18 22.49 kg/m  12/17/17 22.87 kg/m  09/06/17 24.59 kg/m     Patient Care Team    Relationship Specialty Notifications Start End  Mellody Dance, DO PCP - General Family Medicine  03/12/17   Juanito Doom, MD Consulting Physician Pulmonary Disease  03/12/17   Elsie Saas, MD Consulting Physician Orthopedic Surgery  03/12/17     Comment: L rot cuff repair  Allyn Kenner, MD Consulting Physician Dermatology  03/12/17    Comment: sister with sebacious cell CA  Katy Apo, MD Consulting Physician Ophthalmology  03/12/17   Jovita Kussmaul, MD Consulting Physician General Surgery  03/12/17   Nicholas Lose, [place] Physician Hematology and Oncology  03/12/17   Garlan Fair, MD Consulting Physician Gastroenterology  04/12/17      Patient Active Problem List   Diagnosis Date Noted  . HTN (hypertension) 03/12/2017    Priority: High  . Hyperlipemia, mixed 03/12/2017    Priority: High  . Hypothyroidism 03/12/2017    Priority: Medium  . Adjustment disorder 03/12/2017    Priority: Medium  . GERD (gastroesophageal reflux disease) 03/12/2017    Priority: Medium  . Cough variant asthma 02/23/2015    Priority: Medium  . Cancer of right breast, stage 0 09/11/2011    Priority: Medium  . Vitamin D deficiency 04/12/2017    Priority: Low  . Environmental and seasonal allergies 03/12/2017    Priority: Low  . Family history of multiple cancers 03/12/2017    Priority: Low  . History of tobacco abuse 03/12/2017    Priority: Low  . Insomnia 09/11/2011    Priority: Low  . Reactive airway disease 12/17/2017  . Osteopenia 04/12/2017  . Cataracts, bilateral- s/p surgery. 03/12/2017  . Allergic rhinitis 03/23/2015  . Arthralgia 09/11/2011    Past Medical history, Surgical history, Family history, Social history, Allergies and Medications have been entered into the medical record, reviewed and changed as needed.    Current Meds  Medication Sig  . albuterol (PROVENTIL HFA;VENTOLIN HFA) 108 (90 Base) MCG/ACT inhaler Inhale 1 puff into the lungs every 6 (six) hours as needed for wheezing or shortness of breath.  . beclomethasone (QVAR REDIHALER) 80 MCG/ACT inhaler Inhale 2 puffs into the lungs 2 (two) times daily.  . cetirizine (ZYRTEC) 10 MG tablet Take 10 mg by mouth daily.  Marland Kitchen ezetimibe (ZETIA) 10 MG tablet Take 1 tablet  (10 mg total) by mouth daily.  . fluticasone (FLONASE) 50 MCG/ACT nasal spray Place 1 spray into both nostrils daily.  Marland Kitchen levothyroxine (SYNTHROID, LEVOTHROID) 100 MCG tablet 1 tablet every other day alternating with 88 mcg tablet  . levothyroxine (SYNTHROID, LEVOTHROID) 88 MCG tablet 1 tablet every other day alternating with 100 mcg tabs  . montelukast (SINGULAIR) 10 MG tablet TAKE 1 TABLET BY MOUTH EVERY DAY FOR ALLERGIES & ASTHMA  . simvastatin (ZOCOR) 10 MG tablet TAKE 1 TABLET BY MOUTH EVERYDAY AT BEDTIME  . triamcinolone cream (KENALOG) 0.1 % Apply 1 application topically 2 (two) times daily.  . valACYclovir (VALTREX) 1000 MG tablet Take 1 tablet (1,000 mg total) by mouth 3 (three) times daily.  Marland Kitchen venlafaxine XR (EFFEXOR-XR) 75 MG 24 hr capsule Take 1 capsule (75 mg total) by mouth at bedtime.  . Vitamin D, Ergocalciferol, (DRISDOL) 50000 units CAPS capsule Take one tablet wkly  . [DISCONTINUED] losartan-hydrochlorothiazide (HYZAAR) 100-12.5 MG per tablet Take 0.5 tablets by mouth daily.  Allergies:  Allergies  Allergen Reactions  . Advair Diskus [Fluticasone-Salmeterol]     Hoarseness.  . Neosporin [Neomycin-Polymyxin-Gramicidin] Itching     Review of Systems:  A fourteen system review of systems was performed and found to be positive as per HPI.   Objective:   Blood pressure 137/82, pulse 87, height 5\' 7"  (1.702 m), weight 143 lb 9.6 oz (65.1 kg), SpO2 97 %. Body mass index is 22.49 kg/m. General:  Well Developed, well nourished, appropriate for stated age.  Neuro:  Alert and oriented,  extra-ocular muscles intact  HEENT:  Normocephalic, atraumatic, neck supple, no carotid bruits appreciated  Skin:  no gross rash, warm, pink. Cardiac:  RRR, S1 S2 Respiratory:  ECTA B/L and A/P, Not using accessory muscles, speaking in full sentences- unlabored. Vascular:  Ext warm, no cyanosis apprec.; cap RF less 2 sec. Psych:  No HI/SI, judgement and insight good, Euthymic mood. Full  Affect.

## 2018-02-18 ENCOUNTER — Other Ambulatory Visit: Payer: Self-pay | Admitting: Family Medicine

## 2018-02-18 NOTE — Telephone Encounter (Signed)
Medication last filled by pervious provider.  Patient was last seen 02/11/18.  Please review and advised. MPulliam, CMA/RT(R)

## 2018-03-04 ENCOUNTER — Encounter: Payer: Self-pay | Admitting: Adult Health

## 2018-03-13 ENCOUNTER — Other Ambulatory Visit: Payer: Self-pay | Admitting: Family Medicine

## 2018-03-13 DIAGNOSIS — E039 Hypothyroidism, unspecified: Secondary | ICD-10-CM

## 2018-03-14 ENCOUNTER — Other Ambulatory Visit: Payer: Self-pay | Admitting: *Deleted

## 2018-03-14 ENCOUNTER — Other Ambulatory Visit: Payer: Self-pay

## 2018-03-14 DIAGNOSIS — R232 Flushing: Secondary | ICD-10-CM

## 2018-03-14 MED ORDER — VENLAFAXINE HCL ER 75 MG PO CP24: 75.0000 mg | ORAL_CAPSULE | Freq: Every day | ORAL | 0 refills | Status: DC

## 2018-03-19 ENCOUNTER — Encounter: Payer: PPO | Admitting: Adult Health

## 2018-03-28 ENCOUNTER — Other Ambulatory Visit (INDEPENDENT_AMBULATORY_CARE_PROVIDER_SITE_OTHER): Payer: PPO

## 2018-03-28 DIAGNOSIS — M8580 Other specified disorders of bone density and structure, unspecified site: Secondary | ICD-10-CM | POA: Diagnosis not present

## 2018-03-28 DIAGNOSIS — I10 Essential (primary) hypertension: Secondary | ICD-10-CM

## 2018-03-28 DIAGNOSIS — E782 Mixed hyperlipidemia: Secondary | ICD-10-CM

## 2018-03-28 DIAGNOSIS — E559 Vitamin D deficiency, unspecified: Secondary | ICD-10-CM | POA: Diagnosis not present

## 2018-03-28 DIAGNOSIS — J4530 Mild persistent asthma, uncomplicated: Secondary | ICD-10-CM

## 2018-03-28 DIAGNOSIS — E039 Hypothyroidism, unspecified: Secondary | ICD-10-CM | POA: Diagnosis not present

## 2018-03-28 DIAGNOSIS — F4320 Adjustment disorder, unspecified: Secondary | ICD-10-CM

## 2018-03-29 ENCOUNTER — Other Ambulatory Visit: Payer: PPO

## 2018-03-29 ENCOUNTER — Telehealth: Payer: Self-pay | Admitting: Adult Health

## 2018-03-29 ENCOUNTER — Inpatient Hospital Stay: Payer: PPO | Attending: Adult Health | Admitting: Adult Health

## 2018-03-29 ENCOUNTER — Encounter: Payer: Self-pay | Admitting: Adult Health

## 2018-03-29 VITALS — BP 130/84 | HR 81 | Temp 98.4°F | Resp 18 | Ht 67.0 in | Wt 146.9 lb

## 2018-03-29 DIAGNOSIS — R232 Flushing: Secondary | ICD-10-CM

## 2018-03-29 DIAGNOSIS — Z87891 Personal history of nicotine dependence: Secondary | ICD-10-CM | POA: Insufficient documentation

## 2018-03-29 DIAGNOSIS — M8580 Other specified disorders of bone density and structure, unspecified site: Secondary | ICD-10-CM | POA: Insufficient documentation

## 2018-03-29 DIAGNOSIS — D0591 Unspecified type of carcinoma in situ of right breast: Secondary | ICD-10-CM

## 2018-03-29 DIAGNOSIS — Z86000 Personal history of in-situ neoplasm of breast: Secondary | ICD-10-CM | POA: Diagnosis not present

## 2018-03-29 LAB — CBC WITH DIFFERENTIAL/PLATELET
Basophils Absolute: 0 10*3/uL (ref 0.0–0.2)
Basos: 0 %
EOS (ABSOLUTE): 0.1 10*3/uL (ref 0.0–0.4)
Eos: 2 %
Hematocrit: 41.2 % (ref 34.0–46.6)
Hemoglobin: 13.5 g/dL (ref 11.1–15.9)
Immature Grans (Abs): 0 10*3/uL (ref 0.0–0.1)
Immature Granulocytes: 0 %
Lymphocytes Absolute: 1.4 10*3/uL (ref 0.7–3.1)
Lymphs: 26 %
MCH: 30.3 pg (ref 26.6–33.0)
MCHC: 32.8 g/dL (ref 31.5–35.7)
MCV: 92 fL (ref 79–97)
Monocytes Absolute: 0.8 10*3/uL (ref 0.1–0.9)
Monocytes: 14 %
Neutrophils Absolute: 3.1 10*3/uL (ref 1.4–7.0)
Neutrophils: 58 %
Platelets: 330 10*3/uL (ref 150–450)
RBC: 4.46 x10E6/uL (ref 3.77–5.28)
RDW: 12.9 % (ref 12.3–15.4)
WBC: 5.4 10*3/uL (ref 3.4–10.8)

## 2018-03-29 LAB — COMPREHENSIVE METABOLIC PANEL
ALT: 17 IU/L (ref 0–32)
AST: 17 IU/L (ref 0–40)
Albumin/Globulin Ratio: 1.5 (ref 1.2–2.2)
Albumin: 3.7 g/dL (ref 3.5–4.8)
Alkaline Phosphatase: 93 IU/L (ref 39–117)
BUN/Creatinine Ratio: 18 (ref 12–28)
BUN: 14 mg/dL (ref 8–27)
Bilirubin Total: 0.4 mg/dL (ref 0.0–1.2)
CO2: 26 mmol/L (ref 20–29)
Calcium: 9.1 mg/dL (ref 8.7–10.3)
Chloride: 101 mmol/L (ref 96–106)
Creatinine, Ser: 0.78 mg/dL (ref 0.57–1.00)
GFR calc Af Amer: 87 mL/min/{1.73_m2} (ref >59)
GFR calc non Af Amer: 76 mL/min/{1.73_m2} (ref >59)
Globulin, Total: 2.5 g/dL (ref 1.5–4.5)
Glucose: 90 mg/dL (ref 65–99)
Potassium: 4.4 mmol/L (ref 3.5–5.2)
Sodium: 144 mmol/L (ref 134–144)
Total Protein: 6.2 g/dL (ref 6.0–8.5)

## 2018-03-29 LAB — VITAMIN D 25 HYDROXY (VIT D DEFICIENCY, FRACTURES): Vit D, 25-Hydroxy: 73.6 ng/mL (ref 30.0–100.0)

## 2018-03-29 LAB — HEMOGLOBIN A1C
Est. average glucose Bld gHb Est-mCnc: 114 mg/dL
Hgb A1c MFr Bld: 5.6 % (ref 4.8–5.6)

## 2018-03-29 LAB — TSH: TSH: 2.97 u[IU]/mL (ref 0.450–4.500)

## 2018-03-29 LAB — LIPID PANEL
Chol/HDL Ratio: 3.3 ratio (ref 0.0–4.4)
Cholesterol, Total: 127 mg/dL (ref 100–199)
HDL: 38 mg/dL — ABNORMAL LOW (ref >39)
LDL Calculated: 71 mg/dL (ref 0–99)
Triglycerides: 89 mg/dL (ref 0–149)
VLDL Cholesterol Cal: 18 mg/dL (ref 5–40)

## 2018-03-29 LAB — T4, FREE: Free T4: 1.2 ng/dL (ref 0.82–1.77)

## 2018-03-29 MED ORDER — VENLAFAXINE HCL ER 75 MG PO CP24: 75.0000 mg | ORAL_CAPSULE | Freq: Every day | ORAL | 0 refills | Status: DC

## 2018-03-29 NOTE — Patient Instructions (Signed)
Bone Health Bones protect organs, store calcium, and anchor muscles. Good health habits, such as eating nutritious foods and exercising regularly, are important for maintaining healthy bones. They can also help to prevent a condition that causes bones to lose density and become weak and brittle (osteoporosis). Why is bone mass important? Bone mass refers to the amount of bone tissue that you have. The higher your bone mass, the stronger your bones. An important step toward having healthy bones throughout life is to have strong and dense bones during childhood. A young adult who has a high bone mass is more likely to have a high bone mass later in life. Bone mass at its greatest it is called peak bone mass. A large decline in bone mass occurs in older adults. In women, it occurs about the time of menopause. During this time, it is important to practice good health habits, because if more bone is lost than what is replaced, the bones will become less healthy and more likely to break (fracture). If you find that you have a low bone mass, you may be able to prevent osteoporosis or further bone loss by changing your diet and lifestyle. How can I find out if my bone mass is low? Bone mass can be measured with an X-ray test that is called a bone mineral density (BMD) test. This test is recommended for all women who are age 65 or older. It may also be recommended for men who are age 70 or older, or for people who are more likely to develop osteoporosis due to:  Having bones that break easily.  Having a long-term disease that weakens bones, such as kidney disease or rheumatoid arthritis.  Having menopause earlier than normal.  Taking medicine that weakens bones, such as steroids, thyroid hormones, or hormone treatment for breast cancer or prostate cancer.  Smoking.  Drinking three or more alcoholic drinks each day.  What are the nutritional recommendations for healthy bones? To have healthy bones, you  need to get enough of the right minerals and vitamins. Most nutrition experts recommend getting these nutrients from the foods that you eat. Nutritional recommendations vary from person to person. Ask your health care provider what is healthy for you. Here are some general guidelines. Calcium Recommendations Calcium is the most important (essential) mineral for bone health. Most people can get enough calcium from their diet, but supplements may be recommended for people who are at risk for osteoporosis. Good sources of calcium include:  Dairy products, such as low-fat or nonfat milk, cheese, and yogurt.  Dark green leafy vegetables, such as bok choy and broccoli.  Calcium-fortified foods, such as orange juice, cereal, bread, soy beverages, and tofu products.  Nuts, such as almonds.  Follow these recommended amounts for daily calcium intake:  Children, age 1?3: 700 mg.  Children, age 4?8: 1,000 mg.  Children, age 9?13: 1,300 mg.  Teens, age 14?18: 1,300 mg.  Adults, age 19?50: 1,000 mg.  Adults, age 51?70: ? Men: 1,000 mg. ? Women: 1,200 mg.  Adults, age 71 or older: 1,200 mg.  Pregnant and breastfeeding females: ? Teens: 1,300 mg. ? Adults: 1,000 mg.  Vitamin D Recommendations Vitamin D is the most essential vitamin for bone health. It helps the body to absorb calcium. Sunlight stimulates the skin to make vitamin D, so be sure to get enough sunlight. If you live in a cold climate or you do not get outside often, your health care provider may recommend that you take vitamin   D supplements. Good sources of vitamin D in your diet include:  Egg yolks.  Saltwater fish.  Milk and cereal fortified with vitamin D.  Follow these recommended amounts for daily vitamin D intake:  Children and teens, age 1?18: 600 international units.  Adults, age 50 or younger: 400-800 international units.  Adults, age 51 or older: 800-1,000 international units.  Other Nutrients Other nutrients  for bone health include:  Phosphorus. This mineral is found in meat, poultry, dairy foods, nuts, and legumes. The recommended daily intake for adult men and adult women is 700 mg.  Magnesium. This mineral is found in seeds, nuts, dark green vegetables, and legumes. The recommended daily intake for adult men is 400?420 mg. For adult women, it is 310?320 mg.  Vitamin K. This vitamin is found in green leafy vegetables. The recommended daily intake is 120 mg for adult men and 90 mg for adult women.  What type of physical activity is best for building and maintaining healthy bones? Weight-bearing and strength-building activities are important for building and maintaining peak bone mass. Weight-bearing activities cause muscles and bones to work against gravity. Strength-building activities increases muscle strength that supports bones. Weight-bearing and muscle-building activities include:  Walking and hiking.  Jogging and running.  Dancing.  Gym exercises.  Lifting weights.  Tennis and racquetball.  Climbing stairs.  Aerobics.  Adults should get at least 30 minutes of moderate physical activity on most days. Children should get at least 60 minutes of moderate physical activity on most days. Ask your health care provide what type of exercise is best for you. Where can I find more information? For more information, check out the following websites:  National Osteoporosis Foundation: http://nof.org/learn/basics  National Institutes of Health: http://www.niams.nih.gov/Health_Info/Bone/Bone_Health/bone_health_for_life.asp  This information is not intended to replace advice given to you by your health care provider. Make sure you discuss any questions you have with your health care provider. Document Released: 11/11/2003 Document Revised: 03/10/2016 Document Reviewed: 08/26/2014 Elsevier Interactive Patient Education  2018 Elsevier Inc.  

## 2018-03-29 NOTE — Telephone Encounter (Signed)
Gave patient avs and calendar of upcoming July appts.  °

## 2018-03-29 NOTE — Progress Notes (Signed)
CLINIC:  Survivorship   REASON FOR VISIT:  Routine follow-up for history of breast cancer.   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  Overall, she reports feeling quite well.   She is seeing PCP regularly.  She is exercising.  She is up to date with skin and colon cancer screenings.  She undergoes her routine mammogram.      REVIEW OF SYSTEMS:  Review of Systems  Constitutional: Negative for appetite change, chills, fatigue, fever and unexpected weight change.  HENT:   Negative for hearing loss, lump/mass and trouble swallowing.   Eyes: Negative for eye problems and icterus.  Respiratory: Negative for chest tightness, cough and shortness of breath.   Cardiovascular: Negative for chest pain, leg swelling and palpitations.  Gastrointestinal: Negative for abdominal distention, abdominal pain, constipation, diarrhea, nausea and vomiting.  Endocrine: Negative for hot flashes.  Skin: Negative for itching and rash.  Neurological: Negative for dizziness, extremity weakness, headaches and numbness.  Hematological: Negative for adenopathy. Does not bruise/bleed easily.  Psychiatric/Behavioral: Negative for depression. The patient is not nervous/anxious.   Breast: Denies any new nodularity, masses, tenderness, nipple changes, or nipple discharge.       PAST MEDICAL/SURGICAL HISTORY:  Past Medical History:  Diagnosis Date  . Arthralgia 09/11/2011  . Asthma   . Breast cancer ([place])    . 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 Hyperlipidemia   . Hypertension   . Insomnia   . Thyroid disease    hypothyroidism   Past Surgical History:  Procedure Laterality Date  . ABDOMINAL HYSTERECTOMY  1975   partial  . BREAST LUMPECTOMY  2011   right breast  . COLONOSCOPY WITH PROPOFOL N/A 04/14/2014   Procedure: COLONOSCOPY WITH PROPOFOL;  Surgeon: Garlan Fair, MD;  Location: [facility] ENDOSCOPY;  Service: Endoscopy;  Laterality: N/A;  . ROTATOR CUFF REPAIR Left 04/2013   Dr. Para March     ALLERGIES:  Allergies  Allergen Reactions  . Advair Diskus [Fluticasone-Salmeterol]     Hoarseness.  . Neosporin [Neomycin-Polymyxin-Gramicidin] Itching     CURRENT MEDICATIONS:  Outpatient Encounter Medications as of 03/29/2018  Medication Sig  . albuterol (PROVENTIL HFA;VENTOLIN HFA) 108 (90 Base) MCG/ACT inhaler Inhale 1 puff into the lungs every 6 (six) hours as needed for wheezing or shortness of breath.  . beclomethasone (QVAR REDIHALER) 80 MCG/ACT inhaler Inhale 2 puffs into the lungs 2 (two) times daily. (Patient taking differently: Inhale 2 puffs into the lungs as needed. )  . cetirizine (ZYRTEC) 10 MG tablet Take 10 mg by mouth as needed.   . ezetimibe (ZETIA) 10 MG tablet TAKE 1 TABLET BY MOUTH EVERY DAY FOR HIGH CHOLESTEROL  . fluticasone (FLONASE) 50 MCG/ACT nasal spray Place 1 spray into both nostrils daily.  Marland Kitchen levothyroxine (SYNTHROID, LEVOTHROID) 100 MCG tablet 1 tablet every other day alternating with 88 mcg tablet  . levothyroxine (SYNTHROID,  LEVOTHROID) 88 MCG tablet TAKE 1 TABLET BY MOUTH EVERY OTHER DAY ALTERNATING WITH 100 MCG TABLETS  . losartan (COZAAR) 50 MG tablet Take 1 tablet (50 mg total) by mouth daily.  . montelukast (SINGULAIR) 10 MG tablet TAKE 1 TABLET BY MOUTH EVERY DAY FOR ALLERGIES & ASTHMA  . simvastatin (ZOCOR) 10 MG tablet TAKE 1 TABLET BY MOUTH EVERYDAY AT BEDTIME  . triamcinolone cream (KENALOG) 0.1 % Apply 1 application topically 2  (two) times daily.  . valACYclovir (VALTREX) 1000 MG tablet Take 1 tablet (1,000 mg total) by mouth 3 (three) times daily.  Marland Kitchen venlafaxine XR (EFFEXOR-XR) 75 MG 24 hr capsule Take 1 capsule (75 mg total) by mouth at bedtime.  . Vitamin D, Ergocalciferol, (DRISDOL) 50000 units CAPS capsule Take one tablet wkly   No facility-administered encounter medications on file as of 03/29/2018.      ONCOLOGIC FAMILY HISTORY:  Family History  Problem Relation Age of Onset  . Cancer Mother        BREAST  . Cancer Father        pancreatic  . Cancer Sister        sebacous cell carcinoma  . Cancer Maternal Aunt        lung  . Cancer Paternal Uncle        colon, stomach    SOCIAL HISTORY:  Social History   Socioeconomic History  . Marital status: Married    Spouse name: Not on file  . Number of children: Not on file  . Years of education: Not on file  . Highest education level: Not on file  Occupational History  . Not on file  Social Needs  . Financial resource strain: Not on file  . Food insecurity:    Worry: Not on file    Inability: Not on file  . Transportation needs:    Medical: Not on file    Non-medical: Not on file  Tobacco Use  . Smoking status: Former Smoker    Packs/day: 1.00    Years: 15.00    Pack years: 15.00    Types: Cigarettes    Last attempt to quit: 12/09/1978    Years since quitting: 39.3  . Smokeless tobacco: Former Systems developer    Quit date: 09/04/1978  Substance and Sexual Activity  . Alcohol use: Yes    Alcohol/week: 0.0 oz    Comment: SOCIALLY wine  . Drug use: No  . Sexual activity: Yes  Lifestyle  . Physical activity:    Days per week: Not on file    Minutes per session: Not on file  . Stress: Not on file  Relationships  . Social connections:    Talks on phone: Not on file    Gets together: Not on file    Attends religious service: Not on file    Active member of club or organization: Not on file    Attends meetings of clubs or organizations: Not on  file    Relationship status: Not on file  . Intimate partner violence:    Fear of current or ex partner: Not on file    Emotionally abused: Not on file    Physically abused: Not on file    Forced sexual activity: Not on file  Other Topics Concern  . Not on file  Social History Narrative  . Not on file      PHYSICAL EXAMINATION:  Vital Signs: Vitals:   03/29/18 1437  BP: 130/84  Pulse: 81  Resp: 18  Temp: 98.4 F (36.9 C)  SpO2: 98%   Filed Weights   03/29/18 1437  Weight: 146 lb 14.4 oz (66.6 kg)   General: Well-nourished, well-appearing female in no acute distress.  Unaccompanied today.   HEENT: Head is normocephalic.  Pupils equal and reactive to light. Conjunctivae clear without exudate.  Sclerae anicteric. Oral mucosa is pink, moist.  Oropharynx is pink without lesions or erythema.  Lymph: No cervical, supraclavicular, or infraclavicular lymphadenopathy noted on palpation.  Cardiovascular: Regular rate and rhythm.Marland Kitchen Respiratory: Clear to auscultation bilaterally. Chest expansion symmetric; breathing non-labored.  Breast Exam:  -Left breast: No appreciable masses on palpation. No skin redness, thickening, or peau d'orange appearance; no nipple retraction or nipple discharge;   -Right breast: No appreciable masses on palpation. No skin redness, thickening, or peau d'orange appearance; no nipple retraction or nipple discharge; mild distortion in symmetry at previous lumpectomy site well healed scar without erythema or nodularity. -Axilla: No axillary adenopathy bilaterally.  GI: Abdomen soft and round; non-tender, non-distended. Bowel sounds normoactive. No hepatosplenomegaly.   GU: Deferred.  Neuro: No focal deficits. Steady gait.  Psych: Mood and affect normal and appropriate for situation.  MSK: No focal spinal tenderness to palpation, full range of motion in bilateral upper extremities Extremities: No edema. Skin: Warm and dry.  LABORATORY DATA:  None for this  visit   DIAGNOSTIC IMAGING:  Most recent mammogram:      ASSESSMENT AND PLAN:  Ms.. Konicki is a pleasant 72 y.o. 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  [facility] for surveillance and routine follow-up.   1. History of breast cancer:  Ms. Vanepps is currently clinically and radiographically without evidence of disease or recurrence of breast cancer. She will be due for mammogram in 09/2018; orders placed today.  I encouraged her to call me with any questions or concerns before her next visit at the cancer center, and I would be happy to see her sooner, if needed.    2. Bone health:  Given Ms. Spainhour's age, history of breast cancer, and her current anti-estrogen therapy with , she is at risk for bone demineralization. Her last DEXA scan was on 05/29/2017 and indicated osteopenia with a t score of -1.1 in the left femur. She was given education on specific food and activities to promote bone health.  3. Cancer screening:  Due to Ms. Ki's history and her age, she should receive screening for skin cancers, colon cancer. She was encouraged to follow-up with her PCP for appropriate cancer screenings.   4. Health maintenance and wellness promotion: Ms. Rotunno was encouraged to consume 5-7 servings of fruits and vegetables per day. She was also encouraged to engage in moderate to vigorous exercise for 30 minutes per day most days of the week. She was instructed to limit her alcohol consumption and continue to abstain from tobacco use.    Dispo:  -Return to cancer center in one year for LTS follow up -Mammogram in 09/2018 -DEXA in 05/2019   A total of (20) minutes of face-to-face time was spent with this patient with greater than 50% of that time in counseling and care-coordination.   Gardenia Phlegm,  NP Survivorship Program [facility] [phone]   Note: PRIMARY CARE PROVIDER Mellody Dance, [place] [phone]

## 2018-04-01 ENCOUNTER — Other Ambulatory Visit: Payer: Self-pay

## 2018-04-01 ENCOUNTER — Encounter: Payer: Self-pay | Admitting: Family Medicine

## 2018-04-04 ENCOUNTER — Encounter: Payer: Self-pay | Admitting: Family Medicine

## 2018-04-04 ENCOUNTER — Ambulatory Visit (INDEPENDENT_AMBULATORY_CARE_PROVIDER_SITE_OTHER): Payer: PPO | Admitting: Family Medicine

## 2018-04-04 VITALS — BP 117/73 | HR 87 | Ht 67.0 in | Wt 145.9 lb

## 2018-04-04 DIAGNOSIS — E786 Lipoprotein deficiency: Secondary | ICD-10-CM | POA: Diagnosis not present

## 2018-04-04 DIAGNOSIS — E559 Vitamin D deficiency, unspecified: Secondary | ICD-10-CM | POA: Diagnosis not present

## 2018-04-04 DIAGNOSIS — E039 Hypothyroidism, unspecified: Secondary | ICD-10-CM

## 2018-04-04 DIAGNOSIS — D0591 Unspecified type of carcinoma in situ of right breast: Secondary | ICD-10-CM | POA: Diagnosis not present

## 2018-04-04 DIAGNOSIS — I10 Essential (primary) hypertension: Secondary | ICD-10-CM

## 2018-04-04 DIAGNOSIS — E782 Mixed hyperlipidemia: Secondary | ICD-10-CM | POA: Diagnosis not present

## 2018-04-04 MED ORDER — EZETIMIBE 10 MG PO TABS: 5.0000 mg | ORAL_TABLET | Freq: Every day | ORAL | 3 refills | Status: DC

## 2018-04-04 MED ORDER — SIMVASTATIN 5 MG PO TABS: 5.0000 mg | ORAL_TABLET | Freq: Every day | ORAL | 3 refills | Status: DC

## 2018-04-04 NOTE — Progress Notes (Signed)
Assessment and plan:  1. Essential hypertension   2. Hyperlipemia, mixed   3. Low HDL (under 40)   4. Acquired hypothyroidism   5. Cancer of right breast, stage 0   6. Vitamin D deficiency     1. Recent labs drawn 03/28/2018 reviewed with patient today. Patient states she likes to go over labs together in person.  CBC = WNL.  Thyroid T4 & TSH = normalized, WNL. - Continue synthroid treatment as prescribed.  HbA1c = 5.6, WNL. - Continue prudent dietary habits and exercise.  Vitamin D = normalized, WNL, 73.6. - Continue once weekly prescription.  See med list below. - Continue calcium supplementation.  CMP = WNL. Kidney function = WNL, 0.78. - Reviewed that goal serum creatinine is under 1.00. - Continue to hydrate adequately to keep BUN value low.  Liver Function ALT = normalized, down to 17 from 20 last check (3 months ago).  2. Cholesterol- Low HDL - Patient is managed on Zetia and Zocor.   - As patient is with no history of atherosclerotic event, diabetes, or PVD etc., and goal LDL is under 100, discussed lowering dose of medication to help increase HDL, while keeping LDL at goal.  Recommended reducing dose to half tablet of each med daily.  - Re-check in 4 months.  Triglycerides = 89 HDL= 38, low. VLDL = 18 LDL = 71   Education and routine counseling performed. Handouts provided.  - Otherwise, continue medications as prescribed.  See med list below.  3. Blood Pressure- Hypertension - Patient is managed on 50 mg losartan. - Patient baseline BP is 110's/70's, controlled at this time.  - Reviewed that while at home, if patient checks her blood pressure while relaxed, her BP measurement may run low.  But, she should never feel dizzy, lightheaded, or sx in that way.  - Patient knows to let us know if she feels dizzy, lightheaded, or otherwise symptomatic.  - Ambulatory BP monitoring encouraged.  Keep log and bring in next OV. - Advised patient to bring her blood pressure cuff with her to appointments if she feels concerns about the accuracy of her measurements.    - Encouraged patient to continue her active lifestyle and prudent habits.  4. BMI Counseling Explained to patient what BMI refers to, and what it means medically.    Told patient to think about it as a "medical risk stratification measurement" and how increasing BMI is associated with increasing risk/ or worsening state of various diseases such as hypertension, hyperlipidemia, diabetes, premature OA, depression etc.  American Heart Association guidelines for healthy diet, basically Mediterranean diet, and exercise guidelines of 30 minutes 5 days per week or more discussed in detail.  Health counseling performed.  All questions answered.  5. Lifestyle & Preventative Health Maintenance - Advised patient to continue working toward exercising to improve overall mental, physical, and emotional health.    - Encouraged patient to continue engaging in daily physical activity and formal exercise, to a goal of at least 30 minutes per day.  Recommended that the patient eventually strive for at least 150 minutes of moderate cardiovascular activity per week according to guidelines established by the [facility].   - Encouraged patient to continue with weight bearing exercises.  Patient may exercise for 30 minutes on her elliptical machine, with weight increased, and 30 minutes on her treadmill.  - Healthy dietary habits encouraged, including low-carb, and high amounts of lean protein in diet.   -  Patient should also consume adequate amounts of water - half of body weight in oz of water per day.  6. Follow-Up - Re-check Lipid Panel in 4 months since reducing dose of Zocor and Zetia.  - Continue to return to clinic for regular scheduled chronic follow-up.  - Patient knows she may come in for acute concerns PRN.   Orders Placed This  Encounter  Procedures  . Lipid panel    Meds ordered this encounter  Medications  . simvastatin (ZOCOR) 5 MG tablet    Sig: Take 1 tablet (5 mg total) by mouth at bedtime.    Dispense:  90 tablet    Refill:  3  . ezetimibe (ZETIA) 10 MG tablet    Sig: Take 0.5 tablets (5 mg total) by mouth at bedtime.    Dispense:  45 tablet    Refill:  3    DX Code Needed  .     Return for 1mo- lab only reck Lipid Panel and OV 3 days after with me to review.   Anticipatory guidance and routine counseling done re: condition, txmnt options and need for follow up. All questions of patient's were answered.   Gross side effects, risk and benefits, and alternatives of medications discussed with patient.  Patient is aware that all medications have potential side effects and we are unable to predict every sideeffect or drug-drug interaction that may occur.  Expresses verbal understanding and consents to current therapy plan and treatment regiment.  Please see AVS handed out to patient at the end of our visit for additional patient instructions/ counseling done pertaining to today's office visit.  Note: This document was prepared using Dragon voice recognition software and may include unintentional dictation errors.    This document serves as a record of services personally performed by Mellody Dance, DO. It was created on her behalf by [name], a trained medical scribe. The creation of this record is based on the scribe's personal observations and the provider's statements to them.   I have reviewed the above medical documentation for accuracy and completeness and I concur.  Mellody Dance 04/04/18 4:55 PM   ----------------------------------------------------------------------------------------------------------------------  Subjective:   CC:   Lindsey Price is a 72 y.o. female who presents to [place] at [facility] today for review and discussion of recent  bloodwork that was done.  1. All recent blood work that we ordered was reviewed with patient today.  Patient was counseled on all abnormalities and we discussed dietary and lifestyle changes that could help those values (also medications when appropriate).  Extensive health counseling performed and all patient's concerns/ questions were addressed.   Patient exercises for an hour every day.  Notes that she uses both the treadmill and elliptical machine.  She was a runner in the past.  Patient is concerned about her blood pressure cuff, stating "it's always low" when she uses it.  Notes that this isn't accurate and gives a few examples, stating that her blood pressure was normal at the [facility], and was 120/70 when she donated blood yesterday at the [facility].   Wt Readings from Last 3 Encounters:  04/04/18 145 lb 14.4 oz (66.2 kg)  03/29/18 146 lb 14.4 oz (66.6 kg)  02/11/18 143 lb 9.6 oz (65.1 kg)   BP Readings from Last 3 Encounters:  04/04/18 117/73  03/29/18 130/84  02/11/18 137/82   Pulse Readings from Last 3 Encounters:  04/04/18 87  03/29/18 81  02/11/18  87   BMI Readings from Last 3 Encounters:  04/04/18 22.85 kg/m  03/29/18 23.01 kg/m  02/11/18 22.49 kg/m     Patient Care Team    Relationship Specialty Notifications Start End  Mellody Dance, DO PCP - General Family Medicine  03/12/17   Juanito Doom, MD Consulting Physician Pulmonary Disease  03/12/17   Elsie Saas, MD Consulting Physician Orthopedic Surgery  03/12/17    Comment: L rot cuff repair  Allyn Kenner, MD Consulting Physician Dermatology  03/12/17    Comment: sister with sebacious cell CA  Katy Apo, MD Consulting Physician Ophthalmology  03/12/17   Jovita Kussmaul, MD Consulting Physician General Surgery  03/12/17   Nicholas Lose, MD Consulting Physician Hematology and Oncology  03/12/17   Garlan Fair, MD Consulting Physician Gastroenterology  04/12/17     Full medical history updated and  reviewed in the office today  Patient Active Problem List   Diagnosis Date Noted  . HTN (hypertension) 03/12/2017    Priority: High  . Hyperlipemia, mixed 03/12/2017    Priority: High  . Hypothyroidism 03/12/2017    Priority: Medium  . Adjustment disorder 03/12/2017    Priority: Medium  . GERD (gastroesophageal reflux disease) 03/12/2017    Priority: Medium  . Cough variant asthma 02/23/2015    Priority: Medium  . Cancer of right breast, stage 0 09/11/2011    Priority: Medium  . Vitamin D deficiency 04/12/2017    Priority: Low  . Environmental and seasonal allergies 03/12/2017    Priority: Low  . Family history of multiple cancers 03/12/2017    Priority: Low  . History of tobacco abuse 03/12/2017    Priority: Low  . Insomnia 09/11/2011    Priority: Low  . Low HDL (under 40) 04/04/2018  . Reactive airway disease 12/17/2017  . Osteopenia 04/12/2017  . Cataracts, bilateral- s/p surgery. 03/12/2017  . Allergic rhinitis 03/23/2015  . Arthralgia 09/11/2011    Past Medical History:  Diagnosis Date  . Arthralgia 09/11/2011  . Asthma   . Breast cancer ([place])   . 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 Hyperlipidemia   . Hypertension   . Insomnia   . Thyroid disease    hypothyroidism    Past Surgical History:  Procedure Laterality Date  . ABDOMINAL HYSTERECTOMY  1975   partial  . BREAST LUMPECTOMY  2011   right breast  . COLONOSCOPY WITH PROPOFOL N/A 04/14/2014   Procedure: COLONOSCOPY WITH PROPOFOL;  Surgeon: Garlan Fair, MD;  Location: [facility];  Service: Endoscopy;  Laterality: N/A;  . ROTATOR CUFF REPAIR Left 04/2013   Dr. Para March    Social History   Tobacco Use  . Smoking status: Former Smoker    Packs/day: 1.00    Years: 15.00    Pack years: 15.00    Types: Cigarettes    Last attempt to quit: 12/09/1978    Years since quitting: 39.3  . Smokeless tobacco: Former Systems developer    Quit date: 09/04/1978  Substance Use Topics  . Alcohol use: Yes     Alcohol/week: 0.0 oz    Comment: SOCIALLY wine    Family Hx: Family History  Problem Relation Age of Onset  . Cancer Mother        BREAST  . Cancer Father        pancreatic  . Cancer Sister        sebacous cell carcinoma  . Cancer Maternal Aunt  lung  . Cancer Paternal Uncle        colon, stomach     Medications: Current Outpatient Medications  Medication Sig Dispense Refill  . albuterol (PROVENTIL HFA;VENTOLIN HFA) 108 (90 Base) MCG/ACT inhaler Inhale 1 puff into the lungs every 6 (six) hours as needed for wheezing or shortness of breath. 18 g 11  . beclomethasone (QVAR REDIHALER) 80 MCG/ACT inhaler Inhale 2 puffs into the lungs 2 (two) times daily. (Patient taking differently: Inhale 2 puffs into the lungs as needed. ) 1 Inhaler 11  . cetirizine (ZYRTEC) 10 MG tablet Take 10 mg by mouth as needed.     . ezetimibe (ZETIA) 10 MG tablet Take 0.5 tablets (5 mg total) by mouth at bedtime. 45 tablet 3  . fluticasone (FLONASE) 50 MCG/ACT nasal spray Place 1 spray into both nostrils daily. 16 g 11  . levothyroxine (SYNTHROID, LEVOTHROID) 100 MCG tablet 1 tablet every other day alternating with 88 mcg tablet 45 tablet 3  . levothyroxine (SYNTHROID, LEVOTHROID) 88 MCG tablet TAKE 1 TABLET BY MOUTH EVERY OTHER DAY ALTERNATING WITH 100 MCG TABLETS 45 tablet 3  . losartan (COZAAR) 50 MG tablet Take 1 tablet (50 mg total) by mouth daily. 90 tablet 3  . montelukast (SINGULAIR) 10 MG tablet TAKE 1 TABLET BY MOUTH EVERY DAY FOR ALLERGIES & ASTHMA 90 tablet 1  . simvastatin (ZOCOR) 5 MG tablet Take 1 tablet (5 mg total) by mouth at bedtime. 90 tablet 3  . triamcinolone cream (KENALOG) 0.1 % Apply 1 application topically 2 (two) times daily. 453.6 g 0  . valACYclovir (VALTREX) 1000 MG tablet Take 1 tablet (1,000 mg total) by mouth 3 (three) times daily. 30 tablet 0  . venlafaxine XR (EFFEXOR-XR) 75 MG 24 hr capsule Take 1 capsule (75 mg total) by mouth at bedtime. 90 capsule 0  . Vitamin D,  Ergocalciferol, (DRISDOL) 50000 units CAPS capsule Take one tablet wkly 12 capsule 10   No current facility-administered medications for this visit.     Allergies:  Allergies  Allergen Reactions  . Advair Diskus [Fluticasone-Salmeterol]     Hoarseness.  . Neosporin [Neomycin-Polymyxin-Gramicidin] Itching     Review of Systems: General:   No F/C, wt loss Pulm:   No DIB, SOB, pleuritic chest pain Card:  No CP, palpitations Abd:  No n/v/d or pain Ext:  No inc edema from baseline  Objective:  Blood pressure 117/73, pulse 87, height 5\' 7"  (1.702 m), weight 145 lb 14.4 oz (66.2 kg), SpO2 97 %. Body mass index is 22.85 kg/m. Gen:   Well NAD, A and O *3 HEENT:    [place]/AT, EOMI,  MMM Lungs:   Normal work of breathing. CTA B/L, no Wh, rhonchi Heart:   RRR, S1, S2 WNL's, no MRG Abd:   No gross distention Exts:    warm, pink,  Brisk capillary refill, warm and well perfused.  Psych:    No HI/SI, judgement and insight good, Euthymic mood. Full Affect.   Recent Results (from the past 2160 hour(s))  VITAMIN D 25 Hydroxy (Vit-D Deficiency, Fractures)     Status: None   Collection Time: 03/28/18  8:51 AM  Result Value Ref Range   Vit D, 25-Hydroxy 73.6 30.0 - 100.0 ng/mL    Comment: Vitamin D deficiency has been defined by the [place] practice guideline as a level of serum 25-OH vitamin D less than 20 ng/mL (1,2). The Endocrine Society went on to further define vitamin  D insufficiency as a level between 21 and 29 ng/mL (2). 1. IOM (Institute of Medicine). 2010. Dietary reference    intakes for calcium and D. [place]: The    [employer]. 2. Holick MF, Binkley [place], Bischoff-Ferrari HA, et al.    Evaluation, treatment, and prevention of vitamin D    deficiency: an Endocrine Society clinical practice    guideline. JCEM. 2011 Jul; 96(7):1911-30.   T4, free     Status: None   Collection Time: 03/28/18  8:51 AM  Result Value Ref  Range   Free T4 1.20 0.82 - 1.77 ng/dL  TSH     Status: None   Collection Time: 03/28/18  8:51 AM  Result Value Ref Range   TSH 2.970 0.450 - 4.500 uIU/mL  Hemoglobin A1c     Status: None   Collection Time: 03/28/18  8:51 AM  Result Value Ref Range   Hgb A1c MFr Bld 5.6 4.8 - 5.6 %    Comment:          Prediabetes: 5.7 - 6.4          Diabetes: >6.4          Glycemic control for adults with diabetes: <7.0    Est. average glucose Bld gHb Est-mCnc 114 mg/dL  Lipid panel     Status: Abnormal   Collection Time: 03/28/18  8:51 AM  Result Value Ref Range   Cholesterol, Total 127 100 - 199 mg/dL   Triglycerides 89 0 - 149 mg/dL   HDL 38 (L) >39 mg/dL   VLDL Cholesterol Cal 18 5 - 40 mg/dL   LDL Calculated 71 0 - 99 mg/dL   Chol/HDL Ratio 3.3 0.0 - 4.4 ratio    Comment:                                   T. Chol/HDL Ratio                                             Men  Women                               1/2 Avg.Risk  3.4    3.3                                   Avg.Risk  5.0    4.4                                2X Avg.Risk  9.6    7.1                                3X Avg.Risk 23.4   11.0   Comprehensive metabolic panel     Status: None   Collection Time: 03/28/18  8:51 AM  Result Value Ref Range   Glucose 90 65 - 99 mg/dL   BUN 14 8 - 27 mg/dL   Creatinine, Ser 0.78 0.57 - 1.00 mg/dL   GFR calc non Af Amer 76 >59 mL/min/1.73  GFR calc Af Amer 87 >59 mL/min/1.73   BUN/Creatinine Ratio 18 12 - 28   Sodium 144 134 - 144 mmol/L   Potassium 4.4 3.5 - 5.2 mmol/L   Chloride 101 96 - 106 mmol/L   CO2 26 20 - 29 mmol/L   Calcium 9.1 8.7 - 10.3 mg/dL   Total Protein 6.2 6.0 - 8.5 g/dL   Albumin 3.7 3.5 - 4.8 g/dL   Globulin, Total 2.5 1.5 - 4.5 g/dL   Albumin/Globulin Ratio 1.5 1.2 - 2.2   Bilirubin Total 0.4 0.0 - 1.2 mg/dL   Alkaline Phosphatase 93 39 - 117 IU/L   AST 17 0 - 40 IU/L   ALT 17 0 - 32 IU/L  CBC with Differential/Platelet     Status: None   Collection Time:  03/28/18  8:51 AM  Result Value Ref Range   WBC 5.4 3.4 - 10.8 x10E3/uL   RBC 4.46 3.77 - 5.28 x10E6/uL   Hemoglobin 13.5 11.1 - 15.9 g/dL   Hematocrit 41.2 34.0 - 46.6 %   MCV 92 79 - 97 fL   MCH 30.3 26.6 - 33.0 pg   MCHC 32.8 31.5 - 35.7 g/dL   RDW 12.9 12.3 - 15.4 %   Platelets 330 150 - 450 x10E3/uL   Neutrophils 58 Not Estab. %   Lymphs 26 Not Estab. %   Monocytes 14 Not Estab. %   Eos 2 Not Estab. %   Basos 0 Not Estab. %   Neutrophils Absolute 3.1 1.4 - 7.0 x10E3/uL   Lymphocytes Absolute 1.4 0.7 - 3.1 x10E3/uL   Monocytes Absolute 0.8 0.1 - 0.9 x10E3/uL   EOS (ABSOLUTE) 0.1 0.0 - 0.4 x10E3/uL   Basophils Absolute 0.0 0.0 - 0.2 x10E3/uL   Immature Granulocytes 0 Not Estab. %   Immature Grans (Abs) 0.0 0.0 - 0.1 x10E3/uL

## 2018-04-04 NOTE — Patient Instructions (Signed)
Recheck cholesterol in 4 months after change in doses of your Zetia as well as Zocor.   We are just taking half the dose that you were taking prior.     Guidelines for a Low Cholesterol, Low Saturated Fat Diet   Fats - Limit total intake of fats and oils. - Avoid butter, stick margarine, shortening, lard, palm and coconut oils. - Limit mayonnaise, salad dressings, gravies and sauces, unless they are homemade with low-fat ingredients. - Limit chocolate. - Choose low-fat and nonfat products, such as low-fat mayonnaise, low-fat or non-hydrogenated peanut butter, low-fat or fat-free salad dressings and nonfat gravy. - Use vegetable oil, such as canola or olive oil. - Look for margarine that does not contain trans fatty acids. - Use nuts in moderate amounts. - Read ingredient labels carefully to determine both amount and type of fat present in foods. Limit saturated and trans fats! - Avoid high-fat processed and convenience foods.  Meats and Meat Alternatives - Choose fish, chicken, [place] and lean meats. - Use dried beans, peas, lentils and tofu. - Limit egg yolks to three to four per week. - If you eat red meat, limit to no more than three servings per week and choose loin or round cuts. - Avoid fatty meats, such as bacon, sausage, franks, luncheon meats and ribs. - Avoid all organ meats, including liver.  Dairy - Choose nonfat or low-fat milk, yogurt and cottage cheese. - Most cheeses are high in fat. Choose cheeses made from non-fat milk, such as mozzarella and ricotta cheese. - Choose light or fat-free cream cheese and sour cream. - Avoid cream and sauces made with cream.  Fruits and Vegetables - Eat a wide variety of fruits and vegetables. - Use lemon juice, vinegar or "mist" olive oil on vegetables. - Avoid adding sauces, fat or oil to vegetables.  Breads, Cereals and Grains - Choose whole-grain breads, cereals, pastas and rice. - Avoid high-fat snack foods, such as  granola, cookies, pies, pastries, doughnuts and croissants.  Cooking Tips - Avoid deep fried foods. - Trim visible fat off meats and remove skin from poultry before cooking. - Bake, broil, boil, poach or roast poultry, fish and lean meats. - Drain and discard fat that drains out of meat as you cook it. - Add little or no fat to foods. - Use vegetable oil sprays to grease pans for cooking or baking. - Steam vegetables. - Use herbs or no-oil marinades to flavor foods.

## 2018-05-14 ENCOUNTER — Other Ambulatory Visit: Payer: Self-pay | Admitting: Family Medicine

## 2018-05-14 DIAGNOSIS — E559 Vitamin D deficiency, unspecified: Secondary | ICD-10-CM

## 2018-05-16 ENCOUNTER — Other Ambulatory Visit: Payer: Self-pay

## 2018-05-16 MED ORDER — MONTELUKAST SODIUM 10 MG PO TABS: ORAL_TABLET | ORAL | 1 refills | Status: DC

## 2018-05-16 NOTE — Telephone Encounter (Signed)
Pharmacy sent refill request for Montelukast.  Reviewed chart and sent in refills per office policy. MPulliam, CMA/RT(R)

## 2018-05-20 ENCOUNTER — Other Ambulatory Visit: Payer: Self-pay | Admitting: Family Medicine

## 2018-05-20 DIAGNOSIS — E039 Hypothyroidism, unspecified: Secondary | ICD-10-CM

## 2018-05-31 DIAGNOSIS — L989 Disorder of the skin and subcutaneous tissue, unspecified: Secondary | ICD-10-CM | POA: Diagnosis not present

## 2018-05-31 DIAGNOSIS — X32XXXD Exposure to sunlight, subsequent encounter: Secondary | ICD-10-CM | POA: Diagnosis not present

## 2018-05-31 DIAGNOSIS — L570 Actinic keratosis: Secondary | ICD-10-CM | POA: Diagnosis not present

## 2018-05-31 DIAGNOSIS — D485 Neoplasm of uncertain behavior of skin: Secondary | ICD-10-CM | POA: Diagnosis not present

## 2018-06-12 DIAGNOSIS — L929 Granulomatous disorder of the skin and subcutaneous tissue, unspecified: Secondary | ICD-10-CM | POA: Diagnosis not present

## 2018-06-12 DIAGNOSIS — L98499 Non-pressure chronic ulcer of skin of other sites with unspecified severity: Secondary | ICD-10-CM | POA: Diagnosis not present

## 2018-06-12 DIAGNOSIS — L738 Other specified follicular disorders: Secondary | ICD-10-CM | POA: Diagnosis not present

## 2018-07-25 DIAGNOSIS — S46011A Strain of muscle(s) and tendon(s) of the rotator cuff of right shoulder, initial encounter: Secondary | ICD-10-CM | POA: Diagnosis not present

## 2018-08-05 ENCOUNTER — Other Ambulatory Visit: Payer: PPO

## 2018-08-05 DIAGNOSIS — E782 Mixed hyperlipidemia: Secondary | ICD-10-CM

## 2018-08-05 DIAGNOSIS — E786 Lipoprotein deficiency: Secondary | ICD-10-CM | POA: Diagnosis not present

## 2018-08-06 LAB — LIPID PANEL
Chol/HDL Ratio: 2.4 ratio (ref 0.0–4.4)
Cholesterol, Total: 167 mg/dL (ref 100–199)
HDL: 69 mg/dL (ref >39)
LDL Calculated: 89 mg/dL (ref 0–99)
Triglycerides: 43 mg/dL (ref 0–149)
VLDL Cholesterol Cal: 9 mg/dL (ref 5–40)

## 2018-08-07 ENCOUNTER — Other Ambulatory Visit: Payer: Self-pay | Admitting: Family Medicine

## 2018-08-07 DIAGNOSIS — Z1231 Encounter for screening mammogram for malignant neoplasm of breast: Secondary | ICD-10-CM

## 2018-08-08 ENCOUNTER — Encounter: Payer: Self-pay | Admitting: Family Medicine

## 2018-08-08 ENCOUNTER — Ambulatory Visit (INDEPENDENT_AMBULATORY_CARE_PROVIDER_SITE_OTHER): Payer: PPO | Admitting: Family Medicine

## 2018-08-08 VITALS — BP 130/78 | HR 75 | Temp 98.2°F | Ht 67.0 in | Wt 147.0 lb

## 2018-08-08 DIAGNOSIS — I10 Essential (primary) hypertension: Secondary | ICD-10-CM | POA: Diagnosis not present

## 2018-08-08 DIAGNOSIS — R252 Cramp and spasm: Secondary | ICD-10-CM | POA: Diagnosis not present

## 2018-08-08 DIAGNOSIS — E786 Lipoprotein deficiency: Secondary | ICD-10-CM | POA: Diagnosis not present

## 2018-08-08 DIAGNOSIS — E782 Mixed hyperlipidemia: Secondary | ICD-10-CM | POA: Diagnosis not present

## 2018-08-08 NOTE — Progress Notes (Signed)
Impression and Recommendations:    1. Essential hypertension   2. Hyperlipemia, mixed   3. Low HDL (under 40)   4. Muscle cramp, nocturnal     Essential hypertension - Plan: Magnesium, Phosphorus, Basic metabolic panel  Hyperlipemia, mixed - Plan: Magnesium, Phosphorus, Basic metabolic panel  Low HDL (under 40)  Muscle cramp, nocturnal - Plan: Magnesium, Phosphorus, Basic metabolic panel    1. Essential HTN:  -Blood pressure at 130/78 in the office today.  -Patient is asymptomatic and she continues taking her losartan as prescribed with good tolerance.   -Advised the patient to continue on losartan.   2. Mixed Hyperlipidemia   -Reviewed labs from 08/05/2018 with patient today -Discussed that Zetia is mainly used to help with triglycerides.  -Advised the patient to continue on simvastatin and Zetia as prescribed as she is tolerating these medications well.  -Recommended that the patient continue on consuming at least 74 ounces of water daily.  -continuing exercising to aid with maintaining cholesterol.   3. BLE cramping, nocturnal  -Advised the patient to increase water intake to aid with lower extremity cramping -Advised the patient that she can use water flavor substitutes to aid with flavor of water to drink more water.  -She will have a magnesium, BMP, and phosphorous test completed today and in 6 months   Follow up in 6 months for fasting labs along with Magnesium and Phosphorous level along with an OV following.     Education and routine counseling performed. Handouts provided.   Orders Placed This Encounter  Procedures  . Magnesium  . Phosphorus  . Basic metabolic panel    Medications Discontinued During This Encounter  Medication Reason  . valACYclovir (VALTREX) 1000 MG tablet No longer needed (for PRN medications)  . triamcinolone cream (KENALOG) 0.1 % Completed Course     The patient was counseled, risk factors were discussed, anticipatory  guidance given.  Gross side effects, risk and benefits, and alternatives of medications discussed with patient.  Patient is aware that all medications have potential side effects and we are unable to predict every side effect or drug-drug interaction that may occur.  Expresses verbal understanding and consents to current therapy plan and treatment regimen.  Return for Chronic OV in future 4-6 mo & FBW 3d prior- full set.  Please see AVS handed out to patient at the end of our visit for further patient instructions/ counseling done pertaining to today's office visit.    Note:  This document was prepared using Dragon voice recognition software and may include unintentional dictation errors.   This document serves as a record of services personally performed by Mellody Dance, DO. It was created on her behalf by Steva Colder, a trained medical scribe. The creation of this record is based on the scribe's personal observations and the provider's statements to them.   I have reviewed the above medical documentation for accuracy and completeness and I concur.  Mellody Dance, DO    Subjective:    HPI: Lindsey Price is a 72 y.o. female who presents to [place] at [facility] today for follow up of [place].  She has intermittent leg cramping and questions whether she should proceed with magnesium.   She had a cortisone injection in the past that caused her to have 3 days of her face being bright red. She didn't reach out to the providers' office who administered this.   HTN:  -  Her blood pressure  has been controlled at home.  Pt has been checking it regularly.  - Patient reports good compliance with blood pressure medications  - Denies medication S-E   - Smoking Status noted   - She denies new onset of: chest pain, exercise intolerance, shortness of breath, dizziness, visual changes, headache, lower extremity swelling or claudication.    Last 3 blood  pressure readings in our office are as follows: BP Readings from Last 3 Encounters:  08/08/18 130/78  04/04/18 117/73  03/29/18 130/84      CHOL HPI:   -  She  is currently managed with:  See med list from today  Txmnt compliance- she is compliant on Simvastin and Zetia.   Patient is compliant with low chol/ saturated and trans fat diet. She has started exercising more.   No other s-e  Last lipid panel as follows:  Lab Results  Component Value Date   CHOL 167 08/05/2018   HDL 69 08/05/2018   LDLCALC 89 08/05/2018   TRIG 43 08/05/2018   CHOLHDL 2.4 08/05/2018    Hepatic Function Latest Ref Rng & Units 03/28/2018 12/13/2017 04/09/2017  Total Protein 6.0 - 8.5 g/dL 6.2 - 6.2  Albumin 3.5 - 4.8 g/dL 3.7 - 3.9  AST 0 - 40 IU/L 17 - 30  ALT 0 - 32 IU/L 17 20 33(H)  Alk Phosphatase 39 - 117 IU/L 93 - 86  Total Bilirubin 0.0 - 1.2 mg/dL 0.4 - 0.4      Pulse Readings from Last 3 Encounters:  08/08/18 75  04/04/18 87  03/29/18 81    Filed Weights   08/08/18 1346  Weight: 147 lb (66.7 kg)      Patient Care Team    Relationship Specialty Notifications Start End  Mellody Dance, DO PCP - General Family Medicine  03/12/17   Juanito Doom, MD Consulting Physician Pulmonary Disease  03/12/17   Elsie Saas, MD Consulting Physician Orthopedic Surgery  03/12/17    Comment: L rot cuff repair  Allyn Kenner, MD Consulting Physician Dermatology  03/12/17    Comment: sister with sebacious cell CA  Katy Apo, MD Consulting Physician Ophthalmology  03/12/17   Jovita Kussmaul, MD Consulting Physician General Surgery  03/12/17   Nicholas Lose, MD Consulting Physician Hematology and Oncology  03/12/17   Garlan Fair, MD Consulting Physician Gastroenterology  04/12/17      Lab Results  Component Value Date   CREATININE 0.69 08/08/2018   BUN 20 08/08/2018   NA 141 08/08/2018   K 4.5 08/08/2018   CL 103 08/08/2018   CO2 26 08/08/2018    Lab Results  Component Value Date    CHOL 167 08/05/2018   CHOL 127 03/28/2018   CHOL 146 04/09/2017    Lab Results  Component Value Date   HDL 69 08/05/2018   HDL 38 (L) 03/28/2018   HDL 45 04/09/2017    Lab Results  Component Value Date   LDLCALC 89 08/05/2018   LDLCALC 71 03/28/2018   LDLCALC 83 04/09/2017    Lab Results  Component Value Date   TRIG 43 08/05/2018   TRIG 89 03/28/2018   TRIG 88 04/09/2017    Lab Results  Component Value Date   CHOLHDL 2.4 08/05/2018   CHOLHDL 3.3 03/28/2018   CHOLHDL 3.2 04/09/2017    No results found for: LDLDIRECT ===================================================================   Patient Active Problem List   Diagnosis Date Noted  . Low HDL (under 40) 04/04/2018    Priority:  High  . HTN (hypertension) 03/12/2017    Priority: High  . Hyperlipemia, mixed 03/12/2017    Priority: High  . Hypothyroidism 03/12/2017    Priority: Medium  . Adjustment disorder 03/12/2017    Priority: Medium  . GERD (gastroesophageal reflux disease) 03/12/2017    Priority: Medium  . Cough variant asthma 02/23/2015    Priority: Medium  . Cancer of right breast, stage 0 09/11/2011    Priority: Medium  . Vitamin D deficiency 04/12/2017    Priority: Low  . Osteopenia 04/12/2017    Priority: Low  . Environmental and seasonal allergies 03/12/2017    Priority: Low  . Family history of multiple cancers 03/12/2017    Priority: Low  . History of tobacco abuse 03/12/2017    Priority: Low  . Insomnia 09/11/2011    Priority: Low  . Muscle cramp, nocturnal 08/08/2018  . Reactive airway disease 12/17/2017  . Cataracts, bilateral- s/p surgery. 03/12/2017  . Allergic rhinitis 03/23/2015  . Arthralgia 09/11/2011     Past Medical History:  Diagnosis Date  . Arthralgia 09/11/2011  . Asthma   . Breast cancer ([place])   . 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 Hyperlipidemia   . Hypertension   . Insomnia   . Thyroid disease    hypothyroidism     Past Surgical  History:  Procedure Laterality Date  . ABDOMINAL HYSTERECTOMY  1975   partial  . BREAST LUMPECTOMY  2011   right breast  . COLONOSCOPY WITH PROPOFOL N/A 04/14/2014   Procedure: COLONOSCOPY WITH PROPOFOL;  Surgeon: Garlan Fair, MD;  Location: [facility];  Service: Endoscopy;  Laterality: N/A;  . ROTATOR CUFF REPAIR Left 04/2013   Dr. Para March     Family History  Problem Relation Age of Onset  . Cancer Mother        BREAST  . Cancer Father        pancreatic  . Cancer Sister        sebacous cell carcinoma  . Cancer Maternal Aunt        lung  . Cancer Paternal Uncle        colon, stomach     Social History   Substance and Sexual Activity  Drug Use No  ,  Social History   Substance and Sexual Activity  Alcohol Use Yes  . Alcohol/week: 0.0 standard drinks   Comment: SOCIALLY wine  ,  Social History   Tobacco Use  Smoking Status Former Smoker  . Packs/day: 1.00  . Years: 15.00  . Pack years: 15.00  . Types: Cigarettes  . Last attempt to quit: 12/09/1978  . Years since quitting: 39.7  Smokeless Tobacco Former Systems developer  . Quit date: 09/04/1978  ,    Current Outpatient Medications on File Prior to Visit  Medication Sig Dispense Refill  . albuterol (PROVENTIL HFA;VENTOLIN HFA) 108 (90 Base) MCG/ACT inhaler Inhale 1 puff into the lungs every 6 (six) hours as needed for wheezing or shortness of breath. 18 g 11  . beclomethasone (QVAR REDIHALER) 80 MCG/ACT inhaler Inhale 2 puffs into the lungs 2 (two) times daily. (Patient taking differently: Inhale 2 puffs into the lungs as needed. ) 1 Inhaler 11  . cetirizine (ZYRTEC) 10 MG tablet Take 10 mg by mouth as needed.     . ezetimibe (ZETIA) 10 MG tablet Take 0.5 tablets (5 mg total) by mouth at bedtime. 45 tablet 3  . fluticasone (FLONASE) 50 MCG/ACT nasal spray Place 1 spray  into both nostrils daily. 16 g 11  . levothyroxine (SYNTHROID, LEVOTHROID) 100 MCG tablet 1 tablet every other day alternating with 88 mcg tablet 45  tablet 3  . levothyroxine (SYNTHROID, LEVOTHROID) 88 MCG tablet TAKE 1 TABLET BY MOUTH EVERY OTHER DAY ALTERNATING WITH 100 MCG TABLETS 45 tablet 3  . losartan (COZAAR) 50 MG tablet Take 1 tablet (50 mg total) by mouth daily. 90 tablet 3  . montelukast (SINGULAIR) 10 MG tablet TAKE 1 TABLET BY MOUTH EVERY DAY FOR ALLERGIES & ASTHMA 90 tablet 1  . simvastatin (ZOCOR) 5 MG tablet Take 1 tablet (5 mg total) by mouth at bedtime. 90 tablet 3  . venlafaxine XR (EFFEXOR-XR) 75 MG 24 hr capsule Take 1 capsule (75 mg total) by mouth at bedtime. 90 capsule 0  . Vitamin D, Ergocalciferol, (DRISDOL) 50000 units CAPS capsule TAKE 1 CAPSULE BY MOUTH ONCE WEEKLY 12 capsule 10   No current facility-administered medications on file prior to visit.      Allergies  Allergen Reactions  . Advair Diskus [Fluticasone-Salmeterol]     Hoarseness.  . Neosporin [Neomycin-Polymyxin-Gramicidin] Itching     Review of Systems:   General:  Denies fever, chills Optho/Auditory:   Denies visual changes, blurred vision Respiratory:   Denies SOB, cough, wheeze, DIB  Cardiovascular:   Denies chest pain, palpitations, painful respirations Gastrointestinal:   Denies nausea, vomiting, diarrhea.  Endocrine:     Denies new hot or cold intolerance Musculoskeletal:  Denies joint swelling, gait issues, or new unexplained myalgias/ arthralgias Skin:  Denies rash, suspicious lesions  Neurological:    Denies dizziness, unexplained weakness, numbness  Psychiatric/Behavioral:   Denies mood changes  Objective:    Blood pressure 130/78, pulse 75, temperature 98.2 F (36.8 C), height 5' 7" (1.702 m), weight 147 lb (66.7 kg), SpO2 99 %.  Body mass index is 23.02 kg/m.  General: Well Developed, well nourished, and in no acute distress.  HEENT: Normocephalic, atraumatic, pupils equal round reactive to light, neck supple, No carotid bruits, no JVD Skin: Warm and dry, cap RF less 2 sec Cardiac: Regular rate and rhythm, S1, S2  WNL's, no murmurs rubs or gallops Respiratory: ECTA B/L, Not using accessory muscles, speaking in full sentences. NeuroM-Sk: Ambulates w/o assistance, moves ext * 4 w/o difficulty, sensation grossly intact.  Ext: scant edema b/l lower ext Psych: No HI/SI, judgement and insight good, Euthymic mood. Full Affect.

## 2018-08-09 LAB — BASIC METABOLIC PANEL
BUN/Creatinine Ratio: 29 — ABNORMAL HIGH (ref 12–28)
BUN: 20 mg/dL (ref 8–27)
CO2: 26 mmol/L (ref 20–29)
Calcium: 9.1 mg/dL (ref 8.7–10.3)
Chloride: 103 mmol/L (ref 96–106)
Creatinine, Ser: 0.69 mg/dL (ref 0.57–1.00)
GFR calc Af Amer: 99 mL/min/{1.73_m2} (ref >59)
GFR calc non Af Amer: 86 mL/min/{1.73_m2} (ref >59)
Glucose: 88 mg/dL (ref 65–99)
Potassium: 4.5 mmol/L (ref 3.5–5.2)
Sodium: 141 mmol/L (ref 134–144)

## 2018-08-09 LAB — PHOSPHORUS: Phosphorus: 3.3 mg/dL (ref 2.5–4.5)

## 2018-08-09 LAB — MAGNESIUM: Magnesium: 2.1 mg/dL (ref 1.6–2.3)

## 2018-09-05 DIAGNOSIS — S46011D Strain of muscle(s) and tendon(s) of the rotator cuff of right shoulder, subsequent encounter: Secondary | ICD-10-CM | POA: Diagnosis not present

## 2018-09-07 ENCOUNTER — Other Ambulatory Visit: Payer: Self-pay | Admitting: Family Medicine

## 2018-09-07 DIAGNOSIS — R232 Flushing: Secondary | ICD-10-CM

## 2018-09-09 DIAGNOSIS — M25511 Pain in right shoulder: Secondary | ICD-10-CM | POA: Diagnosis not present

## 2018-09-10 NOTE — Telephone Encounter (Signed)
Refill request was sent to our office please review and advise. Thanks. MPulliam, CMA/RT(R)

## 2018-09-20 ENCOUNTER — Other Ambulatory Visit: Payer: Self-pay | Admitting: Family Medicine

## 2018-09-20 ENCOUNTER — Other Ambulatory Visit: Payer: Self-pay | Admitting: Adult Health

## 2018-09-20 DIAGNOSIS — Z853 Personal history of malignant neoplasm of breast: Secondary | ICD-10-CM

## 2018-09-24 ENCOUNTER — Ambulatory Visit
Admission: RE | Admit: 2018-09-24 | Discharge: 2018-09-24 | Disposition: A | Discharge status: Home / Self Care | Payer: PPO | Source: Ambulatory Visit | Attending: Family Medicine | Admitting: Family Medicine

## 2018-09-24 DIAGNOSIS — R928 Other abnormal and inconclusive findings on diagnostic imaging of breast: Secondary | ICD-10-CM | POA: Diagnosis not present

## 2018-09-24 DIAGNOSIS — Z853 Personal history of malignant neoplasm of breast: Secondary | ICD-10-CM | POA: Diagnosis not present

## 2018-09-30 ENCOUNTER — Encounter: Payer: Self-pay | Admitting: Family Medicine

## 2018-09-30 ENCOUNTER — Ambulatory Visit (INDEPENDENT_AMBULATORY_CARE_PROVIDER_SITE_OTHER): Payer: PPO | Admitting: Family Medicine

## 2018-09-30 VITALS — BP 123/81 | HR 87 | Temp 98.3°F | Ht 67.0 in | Wt 147.5 lb

## 2018-09-30 DIAGNOSIS — M25511 Pain in right shoulder: Secondary | ICD-10-CM

## 2018-09-30 DIAGNOSIS — G8929 Other chronic pain: Secondary | ICD-10-CM | POA: Diagnosis not present

## 2018-09-30 DIAGNOSIS — S46011D Strain of muscle(s) and tendon(s) of the rotator cuff of right shoulder, subsequent encounter: Secondary | ICD-10-CM

## 2018-09-30 DIAGNOSIS — L259 Unspecified contact dermatitis, unspecified cause: Secondary | ICD-10-CM | POA: Diagnosis not present

## 2018-09-30 DIAGNOSIS — S46011A Strain of muscle(s) and tendon(s) of the rotator cuff of right shoulder, initial encounter: Secondary | ICD-10-CM | POA: Insufficient documentation

## 2018-09-30 MED ORDER — TRIAMCINOLONE ACETONIDE 0.1 % EX CREA: TOPICAL_CREAM | CUTANEOUS | 0 refills | Status: DC

## 2018-09-30 NOTE — Progress Notes (Signed)
Impression and Recommendations:    1. Chronic right shoulder pain   2. Complete tear of right rotator cuff-  txed by Dr Noemi Chapel ortho   3. Contact dermatitis, unspecified contact dermatitis type, unspecified trigger     - Need for blood work and EKG.  1. Need for Shoulder Surgery - Counseling & Education - Advised patient to only obtain surgery if necessary. - Never undergo surgery if the benefit is less than the risk. - Reviewed that surgery is only indicated if patient has loss of function, has failed physical therapy, failed training the area with prudent exercises, etc.  - Educated patient that she should never have pain while at rest. - If she develops pain in the area again, recommended that the patient follow up with physical therapy.  - If pain resumes, advised patient to ice the area early and often, 15 minutes, 3-4 times per day.  - Advised patient to call in here if she has any concerns. - Will continue to monitor.  2. Contact Dermatitis - Steroid cream provided today for patient's relief. - Apply cream to area of complaint.  - Discussed need to discontinue exposure to irritant, such as new detergent.  - Will continue to monitor.   Meds ordered this encounter  Medications  . triamcinolone cream (KENALOG) 0.1 %    Sig: Apply to affected every bid    Dispense:  453.6 g    Refill:  0    Gross side effects, risk and benefits, and alternatives of medications and treatment plan in general discussed with patient.  Patient is aware that all medications have potential side effects and we are unable to predict every side effect or drug-drug interaction that may occur.   Patient will call with any questions prior to using medication if they have concerns.    Expresses verbal understanding and consents to current therapy and treatment regimen.  No barriers to understanding were identified.  Red flag symptoms and signs discussed in detail.  Patient expressed  understanding regarding what to do in case of emergency\urgent symptoms  Please see AVS handed out to patient at the end of our visit for further patient instructions/ counseling done pertaining to today's office visit.   Return for F-up of current med issues as previously d/c pt.     Note:  This note was prepared with assistance of Dragon voice recognition software. Occasional wrong-word or sound-a-like substitutions may have occurred due to the inherent limitations of voice recognition software.   This document serves as a record of services personally performed by Mellody Dance, DO. It was created on her behalf by [name], a trained medical scribe. The creation of this record is based on the scribe's personal observations and the provider's statements to them.   I have reviewed the above medical documentation for accuracy and completeness and I concur.  Mellody Dance, DO 09/30/2018 7:09 PM       --------------------------------------------------------------------------------------------------------------------------------    Subjective:     HPI: Lindsey Price is a 73 y.o. female who presents to [place] at [facility] today for issues as discussed below.  States that she does not think she needs surgery anymore.  Denies pain in her shoulder area, and states she has full range of motion.  Confirms that she retains function.  States that she has one muscle torn in her rotator cuff area.  Patient did 3 months of physical therapy with her other shoulder.  She  was given exercises to do at home for the shoulder in question.  "Some of [the exercises], I couldn't do, and then all of a sudden, [my shoulder] quit hurting.  I can't figure it out."  Lower Right Leg Itching Has itching on her lower right leg, and "little bumps."  Notes they drive her crazy.  She does not currently use a steroid cream.   Wt Readings from Last 3 Encounters:    09/30/18 147 lb 8 oz (66.9 kg)  08/08/18 147 lb (66.7 kg)  04/04/18 145 lb 14.4 oz (66.2 kg)   BP Readings from Last 3 Encounters:  09/30/18 123/81  08/08/18 130/78  04/04/18 117/73   Pulse Readings from Last 3 Encounters:  09/30/18 87  08/08/18 75  04/04/18 87   BMI Readings from Last 3 Encounters:  09/30/18 23.10 kg/m  08/08/18 23.02 kg/m  04/04/18 22.85 kg/m     Patient Care Team    Relationship Specialty Notifications Start End  Mellody Dance, DO PCP - General Family Medicine  03/12/17   Juanito Doom, MD Consulting Physician Pulmonary Disease  03/12/17   Elsie Saas, MD Consulting Physician Orthopedic Surgery  03/12/17    Comment: L rot cuff repair  Allyn Kenner, MD Consulting Physician Dermatology  03/12/17    Comment: sister with sebacious cell CA  Katy Apo, MD Consulting Physician Ophthalmology  03/12/17   Jovita Kussmaul, MD Consulting Physician General Surgery  03/12/17   Nicholas Lose, MD Consulting Physician Hematology and Oncology  03/12/17   Garlan Fair, MD Consulting Physician Gastroenterology  04/12/17      Patient Active Problem List   Diagnosis Date Noted  . Low HDL (under 40) 04/04/2018    Priority: High  . HTN (hypertension) 03/12/2017    Priority: High  . Hyperlipemia, mixed 03/12/2017    Priority: High  . Hypothyroidism 03/12/2017    Priority: Medium  . Adjustment disorder 03/12/2017    Priority: Medium  . GERD (gastroesophageal reflux disease) 03/12/2017    Priority: Medium  . Cough variant asthma 02/23/2015    Priority: Medium  . Cancer of right breast, stage 0 09/11/2011    Priority: Medium  . Vitamin D deficiency 04/12/2017    Priority: Low  . Osteopenia 04/12/2017    Priority: Low  . Environmental and seasonal allergies 03/12/2017    Priority: Low  . Family history of multiple cancers 03/12/2017    Priority: Low  . History of tobacco abuse 03/12/2017    Priority: Low  . Insomnia 09/11/2011    Priority: Low  . Contact  dermatitis 09/30/2018  . Traumatic complete tear of right rotator cuff 09/30/2018  . Chronic right shoulder pain 09/30/2018  . Muscle cramp, nocturnal 08/08/2018  . Reactive airway disease 12/17/2017  . Cataracts, bilateral- s/p surgery. 03/12/2017  . Allergic rhinitis 03/23/2015  . Arthralgia 09/11/2011    Past Medical history, Surgical history, Family history, Social history, Allergies and Medications have been entered into the medical record, reviewed and changed as needed.    Current Meds  Medication Sig  . albuterol (PROVENTIL HFA;VENTOLIN HFA) 108 (90 Base) MCG/ACT inhaler Inhale 1 puff into the lungs every 6 (six) hours as needed for wheezing or shortness of breath.  . beclomethasone (QVAR REDIHALER) 80 MCG/ACT inhaler Inhale 2 puffs into the lungs 2 (two) times daily. (Patient taking differently: Inhale 2 puffs into the lungs as needed. )  . cetirizine (ZYRTEC) 10 MG tablet Take 10 mg by mouth as needed.   Marland Kitchen  ezetimibe (ZETIA) 10 MG tablet Take 0.5 tablets (5 mg total) by mouth at bedtime.  . fluticasone (FLONASE) 50 MCG/ACT nasal spray Place 1 spray into both nostrils daily.  Marland Kitchen levothyroxine (SYNTHROID, LEVOTHROID) 100 MCG tablet 1 tablet every other day alternating with 88 mcg tablet  . levothyroxine (SYNTHROID, LEVOTHROID) 88 MCG tablet TAKE 1 TABLET BY MOUTH EVERY OTHER DAY ALTERNATING WITH 100 MCG TABLETS  . losartan (COZAAR) 50 MG tablet Take 1 tablet (50 mg total) by mouth daily.  . montelukast (SINGULAIR) 10 MG tablet TAKE 1 TABLET BY MOUTH EVERY DAY FOR ALLERGIES & ASTHMA  . simvastatin (ZOCOR) 5 MG tablet Take 1 tablet (5 mg total) by mouth at bedtime.  Marland Kitchen venlafaxine XR (EFFEXOR-XR) 75 MG 24 hr capsule TAKE 1 CAPSULE (75 MG TOTAL) BY MOUTH AT BEDTIME.  Marland Kitchen Vitamin D, Ergocalciferol, (DRISDOL) 50000 units CAPS capsule TAKE 1 CAPSULE BY MOUTH ONCE WEEKLY    Allergies:  Allergies  Allergen Reactions  . Advair Diskus [Fluticasone-Salmeterol]     Hoarseness.  . Neosporin  [Neomycin-Polymyxin-Gramicidin] Itching     Review of Systems:  A fourteen system review of systems was performed and found to be positive as per HPI.   Objective:   Blood pressure 123/81, pulse 87, temperature 98.3 F (36.8 C), height 5\' 7"  (1.702 m), weight 147 lb 8 oz (66.9 kg), SpO2 99 %. Body mass index is 23.1 kg/m. General:  Well Developed, well nourished, appropriate for stated age.  Neuro:  Alert and oriented,  extra-ocular muscles intact  HEENT:  Normocephalic, atraumatic, neck supple, no carotid bruits appreciated  Skin: warm, pink. Cardiac:  RRR, S1 S2 Respiratory:  ECTA B/L and A/P, Not using accessory muscles, speaking in full sentences- unlabored. Vascular:  Ext warm, no cyanosis apprec.; cap RF less 2 sec. Psych:  No HI/SI, judgement and insight good, Euthymic mood. Full Affect.

## 2018-11-24 ENCOUNTER — Other Ambulatory Visit: Payer: Self-pay | Admitting: Family Medicine

## 2018-12-30 ENCOUNTER — Telehealth: Payer: Self-pay | Admitting: Family Medicine

## 2018-12-30 NOTE — Telephone Encounter (Signed)
Per chart patient should have enough through 02/12/2019.  Called patient left message for patient to call the office. MPulliam, CMA/RT(R)

## 2018-12-30 NOTE — Telephone Encounter (Signed)
Patient is requesting a refill of her losartan, if approved please send to [facility] in [place]. Patient have a lab appt on 6/4 and OV with Dr. Jenetta Downer on 6/10

## 2019-01-01 ENCOUNTER — Other Ambulatory Visit: Payer: Self-pay

## 2019-01-01 DIAGNOSIS — I10 Essential (primary) hypertension: Secondary | ICD-10-CM

## 2019-01-01 MED ORDER — LOSARTAN POTASSIUM 50 MG PO TABS: 50.0000 mg | ORAL_TABLET | Freq: Every day | ORAL | 0 refills | Status: DC

## 2019-01-01 NOTE — Telephone Encounter (Signed)
Patient should have medication to last until 02/12/2019.  Called patient left message to call the office. MPulliam, CMA/RT(R)

## 2019-02-06 ENCOUNTER — Other Ambulatory Visit (INDEPENDENT_AMBULATORY_CARE_PROVIDER_SITE_OTHER): Payer: PPO

## 2019-02-06 ENCOUNTER — Other Ambulatory Visit: Payer: Self-pay

## 2019-02-06 DIAGNOSIS — E782 Mixed hyperlipidemia: Secondary | ICD-10-CM | POA: Diagnosis not present

## 2019-02-06 DIAGNOSIS — E559 Vitamin D deficiency, unspecified: Secondary | ICD-10-CM

## 2019-02-06 DIAGNOSIS — E786 Lipoprotein deficiency: Secondary | ICD-10-CM | POA: Diagnosis not present

## 2019-02-06 DIAGNOSIS — I10 Essential (primary) hypertension: Secondary | ICD-10-CM

## 2019-02-06 DIAGNOSIS — R252 Cramp and spasm: Secondary | ICD-10-CM | POA: Diagnosis not present

## 2019-02-06 DIAGNOSIS — E039 Hypothyroidism, unspecified: Secondary | ICD-10-CM | POA: Diagnosis not present

## 2019-02-06 DIAGNOSIS — M8580 Other specified disorders of bone density and structure, unspecified site: Secondary | ICD-10-CM

## 2019-02-07 ENCOUNTER — Other Ambulatory Visit: Payer: PPO

## 2019-02-07 LAB — COMPREHENSIVE METABOLIC PANEL
ALT: 21 IU/L (ref 0–32)
AST: 19 IU/L (ref 0–40)
Albumin/Globulin Ratio: 1.7 (ref 1.2–2.2)
Albumin: 4.1 g/dL (ref 3.7–4.7)
Alkaline Phosphatase: 101 IU/L (ref 39–117)
BUN/Creatinine Ratio: 22 (ref 12–28)
BUN: 18 mg/dL (ref 8–27)
Bilirubin Total: 0.4 mg/dL (ref 0.0–1.2)
CO2: 25 mmol/L (ref 20–29)
Calcium: 9.4 mg/dL (ref 8.7–10.3)
Chloride: 107 mmol/L — ABNORMAL HIGH (ref 96–106)
Creatinine, Ser: 0.81 mg/dL (ref 0.57–1.00)
GFR calc Af Amer: 83 mL/min/{1.73_m2} (ref >59)
GFR calc non Af Amer: 72 mL/min/{1.73_m2} (ref >59)
Globulin, Total: 2.4 g/dL (ref 1.5–4.5)
Glucose: 94 mg/dL (ref 65–99)
Potassium: 4.8 mmol/L (ref 3.5–5.2)
Sodium: 143 mmol/L (ref 134–144)
Total Protein: 6.5 g/dL (ref 6.0–8.5)

## 2019-02-07 LAB — CBC WITH DIFFERENTIAL/PLATELET
Basophils Absolute: 0 10*3/uL (ref 0.0–0.2)
Basos: 0 %
EOS (ABSOLUTE): 0.2 10*3/uL (ref 0.0–0.4)
Eos: 3 %
Hematocrit: 41.3 % (ref 34.0–46.6)
Hemoglobin: 13.7 g/dL (ref 11.1–15.9)
Immature Grans (Abs): 0 10*3/uL (ref 0.0–0.1)
Immature Granulocytes: 0 %
Lymphocytes Absolute: 1.3 10*3/uL (ref 0.7–3.1)
Lymphs: 26 %
MCH: 30 pg (ref 26.6–33.0)
MCHC: 33.2 g/dL (ref 31.5–35.7)
MCV: 91 fL (ref 79–97)
Monocytes Absolute: 0.7 10*3/uL (ref 0.1–0.9)
Monocytes: 14 %
Neutrophils Absolute: 2.8 10*3/uL (ref 1.4–7.0)
Neutrophils: 57 %
Platelets: 322 10*3/uL (ref 150–450)
RBC: 4.56 x10E6/uL (ref 3.77–5.28)
RDW: 13.1 % (ref 11.7–15.4)
WBC: 5 10*3/uL (ref 3.4–10.8)

## 2019-02-07 LAB — LIPID PANEL
Chol/HDL Ratio: 3.1 ratio (ref 0.0–4.4)
Cholesterol, Total: 132 mg/dL (ref 100–199)
HDL: 43 mg/dL (ref >39)
LDL Calculated: 71 mg/dL (ref 0–99)
Triglycerides: 88 mg/dL (ref 0–149)
VLDL Cholesterol Cal: 18 mg/dL (ref 5–40)

## 2019-02-07 LAB — T4, FREE: Free T4: 1.15 ng/dL (ref 0.82–1.77)

## 2019-02-07 LAB — HEMOGLOBIN A1C
Est. average glucose Bld gHb Est-mCnc: 123 mg/dL
Hgb A1c MFr Bld: 5.9 % — ABNORMAL HIGH (ref 4.8–5.6)

## 2019-02-07 LAB — VITAMIN D 25 HYDROXY (VIT D DEFICIENCY, FRACTURES): Vit D, 25-Hydroxy: 66.8 ng/mL (ref 30.0–100.0)

## 2019-02-07 LAB — VITAMIN B12: Vitamin B-12: 654 pg/mL (ref 232–1245)

## 2019-02-07 LAB — TSH: TSH: 4.96 u[IU]/mL — ABNORMAL HIGH (ref 0.450–4.500)

## 2019-02-11 ENCOUNTER — Telehealth: Payer: Self-pay | Admitting: Family Medicine

## 2019-02-11 NOTE — Telephone Encounter (Signed)
Results released. MPulliam, CMA/RT(R)

## 2019-02-11 NOTE — Telephone Encounter (Signed)
Patient called to set up E-visit w/PCP & also request recent lab results be released to [place] for her to review w/Provider on  6/10.  --forwarding request to medical assistant.  glh

## 2019-02-12 ENCOUNTER — Ambulatory Visit (INDEPENDENT_AMBULATORY_CARE_PROVIDER_SITE_OTHER): Payer: PPO | Admitting: Family Medicine

## 2019-02-12 ENCOUNTER — Other Ambulatory Visit: Payer: Self-pay

## 2019-02-12 ENCOUNTER — Encounter: Payer: Self-pay | Admitting: Family Medicine

## 2019-02-12 DIAGNOSIS — I10 Essential (primary) hypertension: Secondary | ICD-10-CM

## 2019-02-12 DIAGNOSIS — G8929 Other chronic pain: Secondary | ICD-10-CM

## 2019-02-12 DIAGNOSIS — E039 Hypothyroidism, unspecified: Secondary | ICD-10-CM | POA: Diagnosis not present

## 2019-02-12 DIAGNOSIS — E559 Vitamin D deficiency, unspecified: Secondary | ICD-10-CM

## 2019-02-12 DIAGNOSIS — R7303 Prediabetes: Secondary | ICD-10-CM

## 2019-02-12 DIAGNOSIS — E786 Lipoprotein deficiency: Secondary | ICD-10-CM | POA: Diagnosis not present

## 2019-02-12 DIAGNOSIS — M25511 Pain in right shoulder: Secondary | ICD-10-CM | POA: Diagnosis not present

## 2019-02-12 DIAGNOSIS — E782 Mixed hyperlipidemia: Secondary | ICD-10-CM

## 2019-02-12 DIAGNOSIS — M25551 Pain in right hip: Secondary | ICD-10-CM | POA: Diagnosis not present

## 2019-02-12 NOTE — Progress Notes (Signed)
Telehealth office visit note for Lindsey Price, D.O- at [facility] at [facility]   I connected with current patient today and verified that I am speaking with the correct person using two identifiers.   . Location of the patient: Home . Location of the provider: Office Only the patient (+/- their family members at pt's discretion) and myself were participating in the encounter    - This visit type was conducted due to national recommendations for restrictions regarding the COVID-19 Pandemic (e.g. social distancing) in an effort to limit this patient's exposure and mitigate transmission in our community.  This format is felt to be most appropriate for this patient at this time.   - The patient did not have access to video technology or had technical difficulties with video requiring transitioning to audio format only. - No physical exam could be performed with this format, beyond that communicated to [place] by the patient/ family members as noted.   - Additionally my office staff/ schedulers discussed with the patient that there may be a monetary charge related to this service, depending on their medical insurance.   The patient expressed understanding, and agreed to proceed.       History of Present Illness:  Patient here for lab follow-up today.  But has acute concerns today- jt pains buttocks.   Pre-DM -- new onset never been told her A1c was elevated prior.  She is still working out over an hour most days of the week with elliptical and/or walking.  Perhaps she is eating a little bit more with Covid and all  HTN:  BP's at home.   121/54, 130/55, 121/ 45;  121/55, 114/50, 132/54, 105/54, 111/59.   Pt is not dizzy or light headed etc.   NO sx at all.    Has known tear in rotator cuff- R ; had L one fixed couple yrs ago ;   She does not want to go back Dr Noemi Chapel.    Recent Results (from the past 2160 hour(s))  CBC with Differential/Platelet     Status: None   Collection Time:  02/06/19  9:08 AM  Result Value Ref Range   WBC 5.0 3.4 - 10.8 x10E3/uL   RBC 4.56 3.77 - 5.28 x10E6/uL   Hemoglobin 13.7 11.1 - 15.9 g/dL   Hematocrit 41.3 34.0 - 46.6 %   MCV 91 79 - 97 fL   MCH 30.0 26.6 - 33.0 pg   MCHC 33.2 31.5 - 35.7 g/dL   RDW 13.1 11.7 - 15.4 %   Platelets 322 150 - 450 x10E3/uL   Neutrophils 57 Not Estab. %   Lymphs 26 Not Estab. %   Monocytes 14 Not Estab. %   Eos 3 Not Estab. %   Basos 0 Not Estab. %   Neutrophils Absolute 2.8 1.4 - 7.0 x10E3/uL   Lymphocytes Absolute 1.3 0.7 - 3.1 x10E3/uL   Monocytes Absolute 0.7 0.1 - 0.9 x10E3/uL   EOS (ABSOLUTE) 0.2 0.0 - 0.4 x10E3/uL   Basophils Absolute 0.0 0.0 - 0.2 x10E3/uL   Immature Granulocytes 0 Not Estab. %   Immature Grans (Abs) 0.0 0.0 - 0.1 x10E3/uL  Hemoglobin A1c     Status: Abnormal   Collection Time: 02/06/19  9:08 AM  Result Value Ref Range   Hgb A1c MFr Bld 5.9 (H) 4.8 - 5.6 %    Comment:          Prediabetes: 5.7 - 6.4  Diabetes: >6.4          Glycemic control for adults with diabetes: <7.0    Est. average glucose Bld gHb Est-mCnc 123 mg/dL  Comprehensive metabolic panel     Status: Abnormal   Collection Time: 02/06/19  9:08 AM  Result Value Ref Range   Glucose 94 65 - 99 mg/dL   BUN 18 8 - 27 mg/dL   Creatinine, Ser 0.81 0.57 - 1.00 mg/dL   GFR calc non Af Amer 72 >59 mL/min/1.73   GFR calc Af Amer 83 >59 mL/min/1.73   BUN/Creatinine Ratio 22 12 - 28   Sodium 143 134 - 144 mmol/L   Potassium 4.8 3.5 - 5.2 mmol/L   Chloride 107 (H) 96 - 106 mmol/L   CO2 25 20 - 29 mmol/L   Calcium 9.4 8.7 - 10.3 mg/dL   Total Protein 6.5 6.0 - 8.5 g/dL   Albumin 4.1 3.7 - 4.7 g/dL   Globulin, Total 2.4 1.5 - 4.5 g/dL   Albumin/Globulin Ratio 1.7 1.2 - 2.2   Bilirubin Total 0.4 0.0 - 1.2 mg/dL   Alkaline Phosphatase 101 39 - 117 IU/L   AST 19 0 - 40 IU/L   ALT 21 0 - 32 IU/L  Lipid panel     Status: None   Collection Time: 02/06/19  9:08 AM  Result Value Ref Range   Cholesterol, Total  132 100 - 199 mg/dL   Triglycerides 88 0 - 149 mg/dL   HDL 43 >39 mg/dL   VLDL Cholesterol Cal 18 5 - 40 mg/dL   LDL Calculated 71 0 - 99 mg/dL   Chol/HDL Ratio 3.1 0.0 - 4.4 ratio    Comment:                                   T. Chol/HDL Ratio                                             Men  Women                               1/2 Avg.Risk  3.4    3.3                                   Avg.Risk  5.0    4.4                                2X Avg.Risk  9.6    7.1                                3X Avg.Risk 23.4   11.0   VITAMIN D 25 Hydroxy (Vit-D Deficiency, Fractures)     Status: None   Collection Time: 02/06/19  9:08 AM  Result Value Ref Range   Vit D, 25-Hydroxy 66.8 30.0 - 100.0 ng/mL    Comment: Vitamin D deficiency has been defined by the [place] practice guideline as a level of serum 25-OH vitamin D less than 20  ng/mL (1,2). The Endocrine Society went on to further define vitamin D insufficiency as a level between 21 and 29 ng/mL (2). 1. IOM (Institute of Medicine). 2010. Dietary reference    intakes for calcium and D. [place]: The    [employer]. 2. Holick MF, Binkley Gila, Bischoff-Ferrari HA, et al.    Evaluation, treatment, and prevention of vitamin D    deficiency: an Endocrine Society clinical practice    guideline. JCEM. 2011 Jul; 96(7):1911-30.   TSH     Status: Abnormal   Collection Time: 02/06/19  9:08 AM  Result Value Ref Range   TSH 4.960 (H) 0.450 - 4.500 uIU/mL  T4, free     Status: None   Collection Time: 02/06/19  9:08 AM  Result Value Ref Range   Free T4 1.15 0.82 - 1.77 ng/dL  Vitamin B12     Status: None   Collection Time: 02/06/19  9:08 AM  Result Value Ref Range   Vitamin B-12 654 232 - 1,245 pg/mL       Impression and Recommendations:    1. Prediabetes-  new onset today 6\20   2. Essential hypertension   3. Hyperlipemia, mixed   4. Low HDL (under 40)   5. Acquired hypothyroidism    6. Vitamin D deficiency   7. Chronic right shoulder pain   8. Right hip/buttocks pain     - - Counseled patient on pathophysiology of the disease process of Pre-DM.   Stressed importance of dietary and lifestyle modifications as first line, in addition to discussing the risks and benefits of metformin.    - Handouts provided.    - Anticipatory guidance given.   - Recheck A1c in 4-70mo.  Told to make appt for this prior to leaving clinic today.   - Encouraged to return to clinic or call the office with any further questions or concerns.  Hypertension: Blood pressure in the low normal range but patient is completely asymptomatic, she works out over an hour every day and has good circulation.  She will push water and continue to monitor.  We will keep meds at current dose and if she develops any lightheadedness if getting up from the toilet or chair or dizziness etc., she will let us know and we may need to adjust meds.  - Did discuss with her that if tolerated lower blood pressure will lower her risk of heart attack and stroke etc.  Hyperlipidemia/low HDL: Patient is LDL at goal of almost 70 or less.  Continue statin at current dose HDL for ALT that patient does to work out over an hour every day, is still in the low normal range.  We will add fish oil of 2 g twice daily and recheck FLP in 6 months.  Hypothyroidism: TSH is elevated but normal free T4.  I have asked CMA if we can add on a free T3 to her labs.  If not in 6 months we will recheck free T3 free T4 and TSH again.  Sooner if she develops any new symptoms.  Vitamin D deficiency: Continue supplementation, continue weightbearing exercises and calcium supplementation to prevent progression of osteopenia/osteoporosis  Chronic shoulder pain and new onset right buttocks pain: Patient will make a appointment in the near future for in person evaluation of these symptoms.  .   - As part of my medical decision making, I reviewed the following  data within the electronic MEDICAL RECORD NUMBER History obtained from pt /family, CMA notes reviewed  and incorporated if applicable, Labs reviewed, Radiograph/ tests reviewed if applicable and OV notes from prior OV's with me, as well as other specialists she/he has seen since seeing me last, were all reviewed and used in my medical decision making process today.   - Additionally, discussion had with patient regarding txmnt plan, and their biases/concerns about that plan were used in my medical decision making today.   - The patient agreed with the plan and demonstrated an understanding of the instructions.   No barriers to understanding were identified.   - Red flag symptoms and signs discussed in detail.  Patient expressed understanding regarding what to do in case of emergency\ urgent symptoms.  The patient was advised to call back or seek an in-person evaluation if the symptoms worsen or if the condition fails to improve as anticipated.   Return Patient will follow-up near future for hip and buttock pain-in person visit, for 50-month, TSH, free T3 and free T4, FLP, A1c then OV with me 3 days later.    No orders of the defined types were placed in this encounter.   No orders of the defined types were placed in this encounter.   Medications Discontinued During This Encounter  Medication Reason  . albuterol (PROVENTIL HFA;VENTOLIN HFA) 108 (90 Base) MCG/ACT inhaler   . fluticasone (FLONASE) 50 MCG/ACT nasal spray Patient Preference  . triamcinolone cream (KENALOG) 0.1 %       I provided 23+ minutes of non-face-to-face time during this encounter,with over 50% of the time in direct counseling on patients medical conditions/ medical concerns.  Additional time was spent with charting and coordination of care after the actual visit commenced.   Note:  This note was prepared with assistance of Dragon voice recognition software. Occasional wrong-word or sound-a-like substitutions may have occurred due  to the inherent limitations of voice recognition software.  Lindsey Dance, DO 02/12/2019 11:13 AM      Patient Care Team    Relationship Specialty Notifications Start End  Lindsey Dance, DO PCP - General Family Medicine  03/12/17   Juanito Doom, MD Consulting Physician Pulmonary Disease  03/12/17   Elsie Saas, MD Consulting Physician Orthopedic Surgery  03/12/17    Comment: L rot cuff repair  Allyn Kenner, MD Consulting Physician Dermatology  03/12/17    Comment: sister with sebacious cell CA  Katy Apo, MD Consulting Physician Ophthalmology  03/12/17   Jovita Kussmaul, MD Consulting Physician General Surgery  03/12/17   Nicholas Lose, MD Consulting Physician Hematology and Oncology  03/12/17   Garlan Fair, MD Consulting Physician Gastroenterology  04/12/17      -Vitals obtained; medications/ allergies reconciled;  personal medical, social, Sx etc.histories were updated by CMA, reviewed by me and are reflected in chart   Patient Active Problem List   Diagnosis Date Noted  . Low HDL (under 40) 04/04/2018    Priority: High  . HTN (hypertension) 03/12/2017    Priority: High  . Hyperlipemia, mixed 03/12/2017    Priority: High  . Hypothyroidism 03/12/2017    Priority: Medium  . Adjustment disorder 03/12/2017    Priority: Medium  . GERD (gastroesophageal reflux disease) 03/12/2017    Priority: Medium  . Cough variant asthma 02/23/2015    Priority: Medium  . Cancer of right breast, stage 0 09/11/2011    Priority: Medium  . Vitamin D deficiency 04/12/2017    Priority: Low  . Osteopenia 04/12/2017    Priority: Low  . Environmental and seasonal allergies  03/12/2017    Priority: Low  . Family history of multiple cancers 03/12/2017    Priority: Low  . History of tobacco abuse 03/12/2017    Priority: Low  . Insomnia 09/11/2011    Priority: Low  . Contact dermatitis 09/30/2018  . Traumatic complete tear of right rotator cuff 09/30/2018  . Chronic right shoulder pain  09/30/2018  . Muscle cramp, nocturnal 08/08/2018  . Reactive airway disease 12/17/2017  . Cataracts, bilateral- s/p surgery. 03/12/2017  . Allergic rhinitis 03/23/2015  . Arthralgia 09/11/2011     Current Meds  Medication Sig  . beclomethasone (QVAR REDIHALER) 80 MCG/ACT inhaler Inhale 2 puffs into the lungs 2 (two) times daily. (Patient taking differently: Inhale 2 puffs into the lungs as needed. )  . cetirizine (ZYRTEC) 10 MG tablet Take 10 mg by mouth as needed.   . ezetimibe (ZETIA) 10 MG tablet Take 0.5 tablets (5 mg total) by mouth at bedtime.  Marland Kitchen levothyroxine (SYNTHROID, LEVOTHROID) 100 MCG tablet 1 tablet every other day alternating with 88 mcg tablet  . levothyroxine (SYNTHROID, LEVOTHROID) 88 MCG tablet TAKE 1 TABLET BY MOUTH EVERY OTHER DAY ALTERNATING WITH 100 MCG TABLETS  . losartan (COZAAR) 50 MG tablet Take 1 tablet (50 mg total) by mouth daily.  . montelukast (SINGULAIR) 10 MG tablet TAKE 1 TABLET BY MOUTH EVERY DAY FOR ALLERGIES AND ASTHMA  . simvastatin (ZOCOR) 5 MG tablet Take 1 tablet (5 mg total) by mouth at bedtime.  Marland Kitchen venlafaxine XR (EFFEXOR-XR) 75 MG 24 hr capsule TAKE 1 CAPSULE (75 MG TOTAL) BY MOUTH AT BEDTIME.  Marland Kitchen Vitamin D, Ergocalciferol, (DRISDOL) 50000 units CAPS capsule TAKE 1 CAPSULE BY MOUTH ONCE WEEKLY     Allergies:  Allergies  Allergen Reactions  . Advair Diskus [Fluticasone-Salmeterol]     Hoarseness.  . Neosporin [Neomycin-Polymyxin-Gramicidin] Itching     ROS:  See above HPI for pertinent positives and negatives   Objective:   There were no vitals taken for this visit.  (if some vitals are omitted, this means that patient was UNABLE to obtain them even though they were asked to get them prior to OV today.  They were asked to call us at their earliest convenience with these once obtained. )  General: A & O * 3; sounds in no acute distress; in usual state of health.  Skin: Pt confirms warm and dry extremities and pink fingertips HEENT:  Pt confirms lips non-cyanotic Chest: Patient confirms normal chest excursion and movement Respiratory: speaking in full sentences, no conversational dyspnea; patient confirms no use of accessory muscles Psych: insight appears good, mood- appears full

## 2019-02-13 NOTE — Addendum Note (Signed)
Addended by: Lanier Prude D on: 02/13/2019 06:28 PM   Modules accepted: Orders

## 2019-02-17 ENCOUNTER — Telehealth: Payer: Self-pay | Admitting: Family Medicine

## 2019-02-17 ENCOUNTER — Other Ambulatory Visit: Payer: Self-pay | Admitting: Family Medicine

## 2019-02-17 DIAGNOSIS — E039 Hypothyroidism, unspecified: Secondary | ICD-10-CM

## 2019-02-17 NOTE — Telephone Encounter (Signed)
Patient and her husband have a scheduled routine dentist appt Wednesday and with the climb in COVID-19 cases increasing, she wants Dr. Colman Cater opinion about keeping this appt or rescheduling. Please advise

## 2019-02-17 NOTE — Telephone Encounter (Signed)
Please advise 

## 2019-02-18 NOTE — Telephone Encounter (Signed)
I do not recommend patients go anywhere at this time unless they really have to.  I would push off the dentist appointment till a later time.

## 2019-02-18 NOTE — Telephone Encounter (Signed)
Patient aware. MPulliam, CMA/RT(R)

## 2019-02-20 ENCOUNTER — Ambulatory Visit (INDEPENDENT_AMBULATORY_CARE_PROVIDER_SITE_OTHER): Payer: PPO | Admitting: Family Medicine

## 2019-02-20 ENCOUNTER — Other Ambulatory Visit: Payer: Self-pay

## 2019-02-20 ENCOUNTER — Encounter: Payer: Self-pay | Admitting: Family Medicine

## 2019-02-20 VITALS — BP 150/69 | HR 86 | Temp 98.1°F | Ht 67.0 in | Wt 149.3 lb

## 2019-02-20 DIAGNOSIS — M7918 Myalgia, other site: Secondary | ICD-10-CM | POA: Diagnosis not present

## 2019-02-20 DIAGNOSIS — R21 Rash and other nonspecific skin eruption: Secondary | ICD-10-CM | POA: Diagnosis not present

## 2019-02-20 DIAGNOSIS — M25552 Pain in left hip: Secondary | ICD-10-CM

## 2019-02-20 DIAGNOSIS — G8929 Other chronic pain: Secondary | ICD-10-CM

## 2019-02-20 MED ORDER — TRIAMCINOLONE ACETONIDE 0.1 % EX CREA: 1.0000 "application " | TOPICAL_CREAM | Freq: Two times a day (BID) | CUTANEOUS | 0 refills | Status: DC

## 2019-02-20 NOTE — Progress Notes (Signed)
Impression and Recommendations:    1. Chronic left hip pain   2. Piriformis muscle pain   3. Rash and nonspecific skin eruption b/l LExt - chronic - 10+ yrs     Chronic left hip pain - Plan: Ambulatory referral to Physical Therapy,  Piriformis muscle pain - Plan: Ambulatory referral to Physical Therapy,  -Explained to patient I do not think she needs a hip replacement.  She is very nervous about this.  Explained she is point tender in the piriformis region of her buttocks and jumps off the table with palpation and a trigger point of it. -We will send her to physical therapy preferably somebody who does dry needling. -Recommend she do home exercise program they set her up with and treatment.  If no improvement in 6 to 8 weeks, she will let us know so we can send her to sports medicine  Rash and nonspecific skin eruption b/l LExt - chronic - 10+ yrs - see's Dr Nevada Crane of Dermatology yrly - Plan: triamcinolone cream (KENALOG) 0.1 %,  -Patient requests that I give her the triamcinolone cream to have in the past.  States it worked well.  Reminded her that if this is histamine related, taking an antihistamine such as Zyrtec or Claritin Allegra every day can help. -Also for further refills and treatment of her rash she will follow-up with her dermatologist for this. -She will continue to go to him yearly for skin screenings.   Orders Placed This Encounter  Procedures  . Ambulatory referral to Physical Therapy    Meds ordered this encounter  Medications  . triamcinolone cream (KENALOG) 0.1 %    Sig: Apply 1 application topically 2 (two) times daily. RF per Derm-Dr Nevada Crane    Dispense:  60 g    Refill:  0     Gross side effects, risk and benefits, and alternatives of medications and treatment plan in general discussed with patient.  Patient is aware that all medications have potential side effects and we are unable to predict every side effect or drug-drug interaction that may occur.    Patient will call with any questions prior to using medication if they have concerns.    Expresses verbal understanding and consents to current therapy and treatment regimen.  No barriers to understanding were identified.  Red flag symptoms and signs discussed in detail.  Patient expressed understanding regarding what to do in case of emergency\urgent symptoms  Please see AVS handed out to patient at the end of our visit for further patient instructions/ counseling done pertaining to today's office visit.   Return if symptoms worsen or fail to improve, for F-up of current med issues as previously d/c pt-every 6 months for care of chronic conditions.     Note:  This note was prepared with assistance of Dragon voice recognition software. Occasional wrong-word or sound-a-like substitutions may have occurred due to the inherent limitations of voice recognition software.      --------------------------------------------------------------------------------------------------------------------------------------------------------------------------------------------------------------------------------------------    Subjective:     HPI: Lindsey Price is a 73 y.o. female who presents to [place] [facility] today for issues as discussed below.   Patient complains of left hip pain for couple of months now.  She quit the elliptical machine and quit walking.  Elliptical hurts less.  The worse is when she steps up onto the first stair.  Does not hurt when she goes downstairs.  There is no pain at night.  There is no pain when lying on it.  She feels like her hip is tight.  She takes 3 Advil's over-the-counter and it does help.  She also uses heat which helps it and occasionally ice  Patient has a dermatologist Dr. Allyn Kenner.  She sees him yearly.  She is asking me today about her rash that she has had for 10+ years on the back of her bilateral calves.  This rash appears to come  on when she is nervous.  It is raised and reddish in appearance per patient.  She is wondering today if it has to do with her vasculature to her lower extremities.  There is if it could be from varicose veins.    Wt Readings from Last 3 Encounters:  02/20/19 149 lb 4.8 oz (67.7 kg)  09/30/18 147 lb 8 oz (66.9 kg)  08/08/18 147 lb (66.7 kg)   BP Readings from Last 3 Encounters:  02/20/19 (!) 150/69  09/30/18 123/81  08/08/18 130/78   Pulse Readings from Last 3 Encounters:  02/20/19 86  09/30/18 87  08/08/18 75   BMI Readings from Last 3 Encounters:  02/20/19 23.38 kg/m  09/30/18 23.10 kg/m  08/08/18 23.02 kg/m     Patient Care Team    Relationship Specialty Notifications Start End  Mellody Dance, DO PCP - General Family Medicine  03/12/17   Juanito Doom, MD Consulting Physician Pulmonary Disease  03/12/17   Elsie Saas, MD Consulting Physician Orthopedic Surgery  03/12/17    Comment: L rot cuff repair  Allyn Kenner, MD Consulting Physician Dermatology  03/12/17    Comment: sister with sebacious cell CA  Katy Apo, MD Consulting Physician Ophthalmology  03/12/17   Jovita Kussmaul, MD Consulting Physician General Surgery  03/12/17   Nicholas Lose, MD Consulting Physician Hematology and Oncology  03/12/17   Garlan Fair, MD Consulting Physician Gastroenterology  04/12/17      Patient Active Problem List   Diagnosis Date Noted  . Low HDL (under 40) 04/04/2018    Priority: High  . HTN (hypertension) 03/12/2017    Priority: High  . Hyperlipemia, mixed 03/12/2017    Priority: High  . Hypothyroidism 03/12/2017    Priority: Medium  . Adjustment disorder 03/12/2017    Priority: Medium  . GERD (gastroesophageal reflux disease) 03/12/2017    Priority: Medium  . Cough variant asthma 02/23/2015    Priority: Medium  . Cancer of right breast, stage 0 09/11/2011    Priority: Medium  . Vitamin D deficiency 04/12/2017    Priority: Low  . Osteopenia 04/12/2017     Priority: Low  . Environmental and seasonal allergies 03/12/2017    Priority: Low  . Family history of multiple cancers 03/12/2017    Priority: Low  . History of tobacco abuse 03/12/2017    Priority: Low  . Insomnia 09/11/2011    Priority: Low  . Contact dermatitis 09/30/2018  . Traumatic complete tear of right rotator cuff 09/30/2018  . Chronic right shoulder pain 09/30/2018  . Muscle cramp, nocturnal 08/08/2018  . Reactive airway disease 12/17/2017  . Cataracts, bilateral- s/p surgery. 03/12/2017  . Allergic rhinitis 03/23/2015  . Arthralgia 09/11/2011    Past Medical history, Surgical history, Family history, Social history, Allergies and Medications have been entered into the medical record, reviewed and changed as needed.    Current Meds  Medication Sig  . beclomethasone (QVAR REDIHALER) 80 MCG/ACT inhaler Inhale 2 puffs into the lungs 2 (two) times daily. (Patient  taking differently: Inhale 2 puffs into the lungs as needed. )  . cetirizine (ZYRTEC) 10 MG tablet Take 10 mg by mouth as needed.   . ezetimibe (ZETIA) 10 MG tablet Take 0.5 tablets (5 mg total) by mouth at bedtime.  Marland Kitchen levothyroxine (SYNTHROID) 88 MCG tablet TAKE 1 TABLET BY MOUTH EVERY OTHER DAY ALTERNATING WITH 100 MCG TABLETS  . levothyroxine (SYNTHROID, LEVOTHROID) 100 MCG tablet 1 tablet every other day alternating with 88 mcg tablet  . losartan (COZAAR) 50 MG tablet Take 1 tablet (50 mg total) by mouth daily.  . montelukast (SINGULAIR) 10 MG tablet TAKE 1 TABLET BY MOUTH EVERY DAY FOR ALLERGIES AND ASTHMA  . simvastatin (ZOCOR) 5 MG tablet Take 1 tablet (5 mg total) by mouth at bedtime.  Marland Kitchen venlafaxine XR (EFFEXOR-XR) 75 MG 24 hr capsule TAKE 1 CAPSULE (75 MG TOTAL) BY MOUTH AT BEDTIME.  Marland Kitchen Vitamin D, Ergocalciferol, (DRISDOL) 50000 units CAPS capsule TAKE 1 CAPSULE BY MOUTH ONCE WEEKLY    Allergies:  Allergies  Allergen Reactions  . Advair Diskus [Fluticasone-Salmeterol]     Hoarseness.  . Neosporin  [Neomycin-Polymyxin-Gramicidin] Itching     Review of Systems:  A fourteen system review of systems was performed and found to be positive as per HPI.   Objective:   Blood pressure (!) 150/69, pulse 86, temperature 98.1 F (36.7 C), height 5\' 7"  (1.702 m), weight 149 lb 4.8 oz (67.7 kg), SpO2 98 %. Body mass index is 23.38 kg/m. General:  Well Developed, well nourished, appropriate for stated age.  Neuro:  Alert and oriented,  extra-ocular muscles intact  HEENT:  Normocephalic, atraumatic, neck supple, no carotid bruits appreciated  Skin:  no gross rash, warm, pink. Cardiac:  RRR, S1 S2 Respiratory:  ECTA B/L and A/P, Not using accessory muscles, speaking in full sentences- unlabored. Vascular:  Ext warm, no cyanosis apprec.; cap RF less 2 sec. Psych:  No HI/SI, judgement and insight good, Euthymic mood. Full Affect.

## 2019-02-22 ENCOUNTER — Other Ambulatory Visit: Payer: Self-pay | Admitting: Family Medicine

## 2019-02-22 DIAGNOSIS — E786 Lipoprotein deficiency: Secondary | ICD-10-CM

## 2019-02-22 DIAGNOSIS — E782 Mixed hyperlipidemia: Secondary | ICD-10-CM

## 2019-03-05 ENCOUNTER — Ambulatory Visit: Payer: PPO | Attending: Family Medicine | Admitting: Physical Therapy

## 2019-03-05 ENCOUNTER — Other Ambulatory Visit: Payer: Self-pay

## 2019-03-05 DIAGNOSIS — M62838 Other muscle spasm: Secondary | ICD-10-CM | POA: Diagnosis not present

## 2019-03-05 DIAGNOSIS — M25552 Pain in left hip: Secondary | ICD-10-CM | POA: Insufficient documentation

## 2019-03-06 ENCOUNTER — Encounter: Payer: Self-pay | Admitting: Physical Therapy

## 2019-03-06 ENCOUNTER — Telehealth: Payer: Self-pay | Admitting: Adult Health

## 2019-03-06 NOTE — Therapy (Signed)
[place], [place], [address] Phone: [phone]   Fax:  [phone]  Physical Therapy Evaluation  Patient Details  Name: Lindsey Price MRN: [id number] Date of Birth: 02-20-1946 Referring Provider (PT): Mellody Dance, DO    Encounter Date: 03/05/2019  PT End of Session - 03/06/19 0557    Visit Number  1    Number of Visits  12    Date for PT Re-Evaluation  04/16/19    PT Start Time  1400    PT Stop Time  1450    PT Time Calculation (min)  50 min    Activity Tolerance  Patient tolerated treatment well    Behavior During Therapy  [facility] for tasks assessed/performed       Past Medical History:  Diagnosis Date  . Arthralgia 09/11/2011  . Asthma   . Breast cancer ([place])   . 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 Hyperlipidemia   . Hypertension   . Insomnia   . Thyroid disease    hypothyroidism    Past Surgical History:  Procedure Laterality Date  . ABDOMINAL HYSTERECTOMY  1975   partial  . BREAST LUMPECTOMY  2011   right breast  . COLONOSCOPY WITH PROPOFOL N/A 04/14/2014   Procedure: COLONOSCOPY WITH PROPOFOL;  Surgeon: Garlan Fair, MD;  Location: [facility];  Service: Endoscopy;  Laterality: N/A;  . ROTATOR CUFF REPAIR Left 04/2013   Dr. Para March    There were no vitals filed for this visit.   Subjective Assessment - 03/05/19 1407    Subjective  Patient presents with pain in her L hip which has been on going since Winter 2020.  It comes and goes.  She was a runner and is "obsessed with exercise".  Continues to use elliptical and bike  6 days a week, despite the pain.  She has difficulty going up steps leading with LLE , has to use a rail.    She can decend without a problem.  sitting long periods (stiffness post) and walking increases her pain. She feels like she is sitting "on a ball".    Pertinent History  Breast Cancer, hypothyroid, HTN, Hyperlipidemia    Limitations   Walking;House hold activities;Sitting    How long can you sit comfortably?  not limited in sitting, only when she goes to get up    How long can you walk comfortably?  4-5 miles with pain 4/10.    Diagnostic tests  none    Patient Stated Goals  Patient would like to be able to do what i want without pain    Currently in Pain?  Yes    Pain Score  2     Pain Location  Hip    Pain Orientation  Left;Lateral    Pain Descriptors / Indicators  Aching    Pain Type  Chronic pain    Pain Onset  More than a month ago    Pain Frequency  Intermittent    Aggravating Factors   running, walking    Pain Relieving Factors  rest , has been stretching it    Effect of Pain on Daily Activities  does not want it to slow her down    Multiple Pain Sites  No         [facility] PT Assessment - 03/06/19 0001      Assessment   Medical Diagnosis  L hip pain, piriformis     Referring Provider (PT)  Mellody Dance, DO     Onset Date/Surgical Date  --   6 mos    Prior Therapy  not for hip       Precautions   Precautions  None    Precaution Comments  breast CA , radiation only       Restrictions   Weight Bearing Restrictions  No      Balance Screen   Has the patient fallen in the past 6 months  No      [place] residence    Living Arrangements  Spouse/significant other    Type of [place] to enter    Entrance Stairs-Number of Steps  6    Entrance Stairs-Rails  Right    Home Layout  Two level    Alternate Level Stairs-Number of Steps  12    Alternate Level Stairs-Rails  Right      Prior Function   Level of Independence  Independent    Vocation  Retired    Tour manager       Cognition   Overall Cognitive Status  Within Functional Limits for tasks assessed      Observation/Other Assessments   Observations  Leg length discrepancy Rt 35 3/4 inch and Lt. 36 inches     Focus on Therapeutic Outcomes (FOTO)   23%       Sensation   Light Touch  Appears Intact      Functional Tests   Functional tests  Squat;Single leg stance      Squat   Comments  needs cues for technique but can do without pain increase       Single Leg Stance   Comments  LLE <10 sec, Rt >20 sec       Posture/Postural Control   Posture Comments  Rt knee valgus, sway back posture , Rt L      AROM   Right Hip External Rotation   65    Right Hip Internal Rotation   25    Left Hip External Rotation   45    Left Hip Internal Rotation   25      PROM   Overall PROM Comments  limited in LLE in external rotation with discomfort       Strength   Right Hip Flexion  5/5    Right Hip Extension  4+/5    Right Hip ABduction  4-/5    Left Hip Flexion  5/5    Left Hip Extension  4+/5    Left Hip External Rotation  4+/5    Left Hip ABduction  4-/5    Right/Left Knee  --   5/5     Flexibility   Hamstrings  tight L>R       Palpation   SI assessment   no pain with light compression, distraction     Palpation comment  pain and tenderness in L piriformis near origin, mild tenderness in low lumbar region, superior gluteals both sides but L.>R       Special Tests    Special Tests  Lumbar;Hip Special Tests    Lumbar Tests  Prone Knee Bend Test;Straight Leg Raise    Hip Special Tests   Saralyn Pilar (FABER) Test;Hip Scouring      Prone Knee Bend Test   Comment  pain in L side (post hip)       Straight Leg Raise  Findings  Negative    Comment  bilateral       Hip Scouring   Findings  Negative    Side  Left    Comments  mild discomfort along lateral edge      Transfers   Comments  Independent , moves with ease         Objective measurements completed on examination: See above findings.       PT Education - 03/06/19 0556    Education Details  PT/POC, dry needling, HEP and stretching, balancing exercise and including stretching post walk/elliptical, heel lift for LLD    Person(s) Educated  Patient    Methods   Explanation;Demonstration;Handout;Verbal cues    Comprehension  Verbalized understanding;Returned demonstration          PT Long Term Goals - 03/06/19 0558      PT LONG TERM GOAL #1   Title  Patient will be I with HEP for ROM, strength and fitness    Time  6    Period  Weeks    Status  New    Target Date  04/16/19      PT LONG TERM GOAL #2   Title  Pt will score <18% on FOTO to demo functional improvement    Baseline  23%    Time  6    Period  Weeks    Status  New    Target Date  04/16/19      PT LONG TERM GOAL #3   Title  Pt will be able to go up and down stairs without pain in L hip, very light use of hand rail for safety.    Time  6    Period  Weeks    Status  New    Target Date  04/16/19      PT LONG TERM GOAL #4   Title  Pt will be able to walk/exercise for 1 hour with pain in L hip <2/10 easily relieved by rest, stretching.    Time  6    Period  Weeks    Status  New    Target Date  04/16/19      PT LONG TERM GOAL #5   Title  Pt will be able to perform gardening, housework without increased pain in LLE    Time  6    Period  Weeks    Status  New    Target Date  04/16/19             Plan - 03/06/19 0602    Clinical Impression Statement  Pt presents with for low complexity eval of L hip pain which has been ongoing for several months.  She waited to get in to the clinic due to [place]. She has a leg length discrepancy of 1/4 inch.  She has good strength overall but hip abduction 4-/5 bilaterally. She is highly motivated to exercise but may need to dial back a bit to change focus on core and hip strength.  Increased tension and tightness in L posterior hip. Given HEP and guidance on technique.  She is interested in dry needling (also rec by MD) and expect her to benefit from this. She should expect a great outcome with PT intevention and consistent moderate exercise.    Personal Factors and Comorbidities  Age;Comorbidity 1;Behavior Pattern     Examination-Activity Limitations  Stairs;Locomotion Level;Squat;Lift;Bend;Transfers    Examination-Participation Restrictions  Other;Yard Work    Stability/Clinical Decision Making  Stable/Uncomplicated  Clinical Decision Making  Low    Rehab Potential  Excellent    PT Frequency  2x / week    PT Duration  6 weeks    PT Treatment/Interventions  ADLs/Self Care Home Management;Cryotherapy;Ultrasound;Stair training;Balance training;Dry needling;Neuromuscular re-education;Functional mobility training;Electrical Stimulation;Moist Heat;Therapeutic activities;Therapeutic exercise;Patient/family education;Manual techniques;Passive range of motion;Taping;Iontophoresis 4mg /ml Dexamethasone    PT Next Visit Plan  check HEP, manual to L hip, DN and progress core/hip strength, consider Pilates    PT Home Exercise Plan  piriformis, hamstring, ITB stretch    Consulted and Agree with Plan of Care  Patient       Patient will benefit from skilled therapeutic intervention in order to improve the following deficits and impairments:  Decreased balance, Impaired flexibility, Increased fascial restricitons, Postural dysfunction, Pain, Decreased strength, Improper body mechanics, Decreased range of motion  Visit Diagnosis: 1. Pain in left hip   2. Other muscle spasm        Problem List Patient Active Problem List   Diagnosis Date Noted  . Contact dermatitis 09/30/2018  . Traumatic complete tear of right rotator cuff 09/30/2018  . Chronic right shoulder pain 09/30/2018  . Muscle cramp, nocturnal 08/08/2018  . Low HDL (under 40) 04/04/2018  . Reactive airway disease 12/17/2017  . Vitamin D deficiency 04/12/2017  . Osteopenia 04/12/2017  . HTN (hypertension) 03/12/2017  . Hyperlipemia, mixed 03/12/2017  . Cataracts, bilateral- s/p surgery. 03/12/2017  . Hypothyroidism 03/12/2017  . Environmental and seasonal allergies 03/12/2017  . Adjustment disorder 03/12/2017  . GERD (gastroesophageal reflux  disease) 03/12/2017  . Family history of multiple cancers 03/12/2017  . History of tobacco abuse 03/12/2017  . Allergic rhinitis 03/23/2015  . Cough variant asthma 02/23/2015  . Cancer of right breast, stage 0 09/11/2011  . Insomnia 09/11/2011  . Arthralgia 09/11/2011    Sharita Bienaime 03/06/2019, 9:28 AM  [facility] [address] [place], [place], [address] Phone: [phone]   Fax:  [phone]  Name: Lindsey Price MRN: [id number] Date of Birth: 15-Sep-1945   Raeford Razor, PT 03/06/19 9:28 AM Phone: [phone] Fax: [phone]

## 2019-03-06 NOTE — Telephone Encounter (Signed)
I talk with patient regarding 7/29 ok with Mendel Ryder

## 2019-03-17 ENCOUNTER — Ambulatory Visit: Payer: PPO | Admitting: Physical Therapy

## 2019-03-17 ENCOUNTER — Telehealth: Payer: Self-pay | Admitting: Family Medicine

## 2019-03-17 NOTE — Telephone Encounter (Signed)
Patient called states she a& husband ate dinner w/ Son & daughter last nite--the son/daughter are now sick w/ fever & diarrhea (have been COVID 19 tested and are awaiting results)--- Patient Lindsey Price wants to know what should she & spouse do???  Do they need to be tested & or is there other advice from the Provider or nurse?   --Forwarding message for med assistant to review w/provider & call pt back @ [phone]  --glh

## 2019-03-17 NOTE — Telephone Encounter (Signed)
Spoke to patient - she is not having any symptoms.  Son in law has symptoms and he and his wife have been tested.  Advised the patient that our protocol is to test if having active symptoms.  Patient should self monitor and call if symptoms develop. MPulliam, CMA/RT(R)

## 2019-03-19 ENCOUNTER — Ambulatory Visit: Payer: PPO | Admitting: Physical Therapy

## 2019-03-20 ENCOUNTER — Encounter: Payer: PPO | Admitting: Physical Therapy

## 2019-03-28 ENCOUNTER — Encounter: Payer: PPO | Admitting: Adult Health

## 2019-03-31 ENCOUNTER — Ambulatory Visit: Payer: PPO | Admitting: Physical Therapy

## 2019-03-31 ENCOUNTER — Encounter: Payer: Self-pay | Admitting: Physical Therapy

## 2019-03-31 ENCOUNTER — Other Ambulatory Visit: Payer: Self-pay

## 2019-03-31 DIAGNOSIS — M25552 Pain in left hip: Secondary | ICD-10-CM

## 2019-03-31 DIAGNOSIS — M62838 Other muscle spasm: Secondary | ICD-10-CM

## 2019-03-31 NOTE — Therapy (Signed)
[place], [place], [address] Phone: [phone]   Fax:  [phone]  Physical Therapy Treatment  Patient Details  Name: Lindsey Price MRN: [id number] Date of Birth: Feb 18, 1946 Referring Provider (PT): Mellody Dance, DO    Encounter Date: 03/31/2019  PT End of Session - 03/31/19 1446    Visit Number  2    Number of Visits  12    Date for PT Re-Evaluation  04/16/19    PT Start Time  1779    PT Stop Time  1130    PT Time Calculation (min)  45 min    Activity Tolerance  Patient tolerated treatment well    Behavior During Therapy  [facility] for tasks assessed/performed       Past Medical History:  Diagnosis Date  . Arthralgia 09/11/2011  . Asthma   . Breast cancer ([place])   . 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 Hyperlipidemia   . Hypertension   . Insomnia   . Thyroid disease    hypothyroidism    Past Surgical History:  Procedure Laterality Date  . ABDOMINAL HYSTERECTOMY  1975   partial  . BREAST LUMPECTOMY  2011   right breast  . COLONOSCOPY WITH PROPOFOL N/A 04/14/2014   Procedure: COLONOSCOPY WITH PROPOFOL;  Surgeon: Garlan Fair, MD;  [facility];  Service: Endoscopy;  Laterality: N/A;  . ROTATOR CUFF REPAIR Left 04/2013   Dr. Para March    There were no vitals filed for this visit.  Subjective Assessment - 03/31/19 1442    Pertinent History  Breast Cancer, hypothyroid, HTN, Hyperlipidemia    Limitations  Walking;House hold activities;Sitting    How long can you sit comfortably?  not limited in sitting, only when she goes to get up    How long can you walk comfortably?  4-5 miles with pain 4/10.    Diagnostic tests  none    Patient Stated Goals  Patient would like to be able to do what i want without pain    Currently in Pain?  Yes    Pain Location  Hip    Pain Orientation  Right;Lateral    Pain Descriptors / Indicators  Aching    Pain Type  Chronic pain     Pain Onset  More than a month ago    Aggravating Factors   running and walking    Pain Relieving Factors  rest    Effect of Pain on Daily Activities  does not want to slow down                       [facility] Adult PT Treatment/Exercise - 03/31/19 0001      Exercises   Exercises  Knee/Hip      Knee/Hip Exercises: Stretches   Active Hamstring Stretch Limitations  2x30 sec hold     Piriformis Stretch Limitations  2 way 2x30 sec hold       Knee/Hip Exercises: Supine   Bridges  2 sets;10 reps    Other Supine Knee/Hip Exercises  supine clamshell 2x10 red       Manual Therapy   Manual Therapy  Soft tissue mobilization    Manual therapy comments  skilled palpation of trigger points     Soft tissue mobilization  to pririformis and glut medius using iastym and        Trigger Point Dry Needling - 03/31/19 0001    Consent Given?  Yes    Education Handout Provided  Yes    Muscles Treated Back/Hip  Piriformis    Dry Needling Comments  2 spots in the piriformis     Piriformis Response  Twitch response elicited           PT Education - 03/31/19 1446    Education Details  updated HEP for light exercises    Person(s) Educated  Patient    Methods  Explanation;Demonstration;Tactile cues;Verbal cues    Comprehension  Verbalized understanding;Returned demonstration;Verbal cues required;Tactile cues required          PT Long Term Goals - 03/06/19 0558      PT LONG TERM GOAL #1   Title  Patient will be I with HEP for ROM, strength and fitness    Time  6    Period  Weeks    Status  New    Target Date  04/16/19      PT LONG TERM GOAL #2   Title  Pt will score <18% on FOTO to demo functional improvement    Baseline  23%    Time  6    Period  Weeks    Status  New    Target Date  04/16/19      PT LONG TERM GOAL #3   Title  Pt will be able to go up and down stairs without pain in L hip, very light use of hand rail for safety.    Time  6    Period  Weeks     Status  New    Target Date  04/16/19      PT LONG TERM GOAL #4   Title  Pt will be able to walk/exercise for 1 hour with pain in L hip <2/10 easily relieved by rest, stretching.    Time  6    Period  Weeks    Status  New    Target Date  04/16/19      PT LONG TERM GOAL #5   Title  Pt will be able to perform gardening, housework without increased pain in LLE    Time  6    Period  Weeks    Status  New    Target Date  04/16/19            Plan - 03/31/19 1448    Clinical Impression Statement  Patient had spasming in her glutes and soreness with deep palpation of her piriformis. She tolerated dry needling good and had a good twtich respose. She was educated on manageing post needle soreness. Patient was advised not to do agressive exercises today.    Examination-Activity Limitations  Stairs;Locomotion Level;Squat;Lift;Bend;Transfers    Clinical Decision Making  Low    PT Frequency  2x / week    PT Duration  6 weeks    PT Treatment/Interventions  ADLs/Self Care Home Management;Cryotherapy;Ultrasound;Stair training;Balance training;Dry needling;Neuromuscular re-education;Functional mobility training;Electrical Stimulation;Moist Heat;Therapeutic activities;Therapeutic exercise;Patient/family education;Manual techniques;Passive range of motion;Taping;Iontophoresis 4mg /ml Dexamethasone    PT Next Visit Plan  check HEP, manual to L hip, DN and progress core/hip strength, consider Pilates    PT Home Exercise Plan  piriformis, hamstring, ITB stretch    Consulted and Agree with Plan of Care  Patient       Patient will benefit from skilled therapeutic intervention in order to improve the following deficits and impairments:  Decreased balance, Impaired flexibility, Increased fascial restricitons, Postural dysfunction, Pain, Decreased strength, Improper body mechanics, Decreased range of motion  Visit  Diagnosis: 1. Pain in left hip   2. Other muscle spasm        Problem List Patient  Active Problem List   Diagnosis Date Noted  . Contact dermatitis 09/30/2018  . Traumatic complete tear of right rotator cuff 09/30/2018  . Chronic right shoulder pain 09/30/2018  . Muscle cramp, nocturnal 08/08/2018  . Low HDL (under 40) 04/04/2018  . Reactive airway disease 12/17/2017  . Vitamin D deficiency 04/12/2017  . Osteopenia 04/12/2017  . HTN (hypertension) 03/12/2017  . Hyperlipemia, mixed 03/12/2017  . Cataracts, bilateral- s/p surgery. 03/12/2017  . Hypothyroidism 03/12/2017  . Environmental and seasonal allergies 03/12/2017  . Adjustment disorder 03/12/2017  . GERD (gastroesophageal reflux disease) 03/12/2017  . Family history of multiple cancers 03/12/2017  . History of tobacco abuse 03/12/2017  . Allergic rhinitis 03/23/2015  . Cough variant asthma 02/23/2015  . Cancer of right breast, stage 0 09/11/2011  . Insomnia 09/11/2011  . Arthralgia 09/11/2011    Carney Living PT DPT 03/31/2019, 3:02 PM  [facility] [address] [place], [place], [address] Phone: [phone]   Fax:  [phone]  Name: Elenor Wildes MRN: [id number] Date of Birth: May 09, 1946

## 2019-04-02 ENCOUNTER — Encounter: Payer: PPO | Admitting: Adult Health

## 2019-04-03 ENCOUNTER — Other Ambulatory Visit: Payer: Self-pay | Admitting: Family Medicine

## 2019-04-03 ENCOUNTER — Ambulatory Visit: Payer: PPO | Admitting: Physical Therapy

## 2019-04-03 ENCOUNTER — Encounter: Payer: Self-pay | Admitting: Physical Therapy

## 2019-04-03 ENCOUNTER — Other Ambulatory Visit: Payer: Self-pay

## 2019-04-03 DIAGNOSIS — M62838 Other muscle spasm: Secondary | ICD-10-CM

## 2019-04-03 DIAGNOSIS — M25552 Pain in left hip: Secondary | ICD-10-CM

## 2019-04-03 DIAGNOSIS — E786 Lipoprotein deficiency: Secondary | ICD-10-CM

## 2019-04-03 DIAGNOSIS — E782 Mixed hyperlipidemia: Secondary | ICD-10-CM

## 2019-04-03 NOTE — Therapy (Signed)
[place], [place], [address] Phone: [phone]   Fax:  [phone]  Physical Therapy Treatment  Patient Details  Name: Lindsey Price MRN: [id number] Date of Birth: 06/23/1946 Referring Provider (PT): Mellody Dance, DO    Encounter Date: 04/03/2019  PT End of Session - 04/03/19 1114    Visit Number  3    Number of Visits  12    Date for PT Re-Evaluation  04/16/19    PT Start Time  1045    PT Stop Time  1125    PT Time Calculation (min)  40 min    Activity Tolerance  Patient tolerated treatment well    Behavior During Therapy  [facility] for tasks assessed/performed       Past Medical History:  Diagnosis Date  . Arthralgia 09/11/2011  . Asthma   . Breast cancer ([place])   . 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 Hyperlipidemia   . Hypertension   . Insomnia   . Thyroid disease    hypothyroidism    Past Surgical History:  Procedure Laterality Date  . ABDOMINAL HYSTERECTOMY  1975   partial  . BREAST LUMPECTOMY  2011   right breast  . COLONOSCOPY WITH PROPOFOL N/A 04/14/2014   Procedure: COLONOSCOPY WITH PROPOFOL;  Surgeon: Garlan Fair, MD;  Location: [facility];  Service: Endoscopy;  Laterality: N/A;  . ROTATOR CUFF REPAIR Left 04/2013   Dr. Para March    There were no vitals filed for this visit.  Subjective Assessment - 04/03/19 1052    Subjective  Patient reports she was sore after the needling but sice the needling she has not had much pain. She just has some stiffness in her hip >She has pain when she is walking longer distances.    Pertinent History  Breast Cancer, hypothyroid, HTN, Hyperlipidemia    Limitations  Walking;House hold activities;Sitting    How long can you sit comfortably?  not limited in sitting, only when she goes to get up    How long can you walk comfortably?  4-5 miles with pain 4/10.    Diagnostic tests  none    Patient Stated Goals  Patient would  like to be able to do what i want without pain    Currently in Pain?  No/denies                       [facility] Adult PT Treatment/Exercise - 04/03/19 0001      Knee/Hip Exercises: Stretches   Active Hamstring Stretch Limitations  2x30 sec hold     Piriformis Stretch Limitations  2 way 2x30 sec hold       Knee/Hip Exercises: Standing   Hip Abduction  2 sets;10 reps    Hip Extension  2 sets;10 reps      Knee/Hip Exercises: Supine   Bridges  2 sets;10 reps    Straight Leg Raises Limitations  x10    Other Supine Knee/Hip Exercises  supine clamshell 2x10 green       Manual Therapy   Manual Therapy  Soft tissue mobilization;Manual Traction    Soft tissue mobilization  to pririformis and glut medius using iastym and     Manual Traction  LAD  3x30 sec bilateral              PT Education - 04/03/19 1053    Education Details  reviewed HEp and symptom mangement  PT Long Term Goals - 03/06/19 0558      PT LONG TERM GOAL #1   Title  Patient will be I with HEP for ROM, strength and fitness    Time  6    Period  Weeks    Status  New    Target Date  04/16/19      PT LONG TERM GOAL #2   Title  Pt will score <18% on FOTO to demo functional improvement    Baseline  23%    Time  6    Period  Weeks    Status  New    Target Date  04/16/19      PT LONG TERM GOAL #3   Title  Pt will be able to go up and down stairs without pain in L hip, very light use of hand rail for safety.    Time  6    Period  Weeks    Status  New    Target Date  04/16/19      PT LONG TERM GOAL #4   Title  Pt will be able to walk/exercise for 1 hour with pain in L hip <2/10 easily relieved by rest, stretching.    Time  6    Period  Weeks    Status  New    Target Date  04/16/19      PT LONG TERM GOAL #5   Title  Pt will be able to perform gardening, housework without increased pain in LLE    Time  6    Period  Weeks    Status  New    Target Date  04/16/19             Plan - 04/03/19 1117    Clinical Impression Statement  Patient continues to have spasming in her glutes. Therapy focused on manual trigger point release to the area and LAD. She tolerated stretching and ther-ex well. She was encouraged to continue with her exercises at home. She was given a green band.Therapy also added standing exercises.    Personal Factors and Comorbidities  Age;Comorbidity 1;Behavior Pattern    Examination-Activity Limitations  Stairs;Locomotion Level;Squat;Lift;Bend;Transfers    Examination-Participation Restrictions  Other;Yard Work    Stability/Clinical Decision Making  Stable/Uncomplicated    Clinical Decision Making  Moderate    Rehab Potential  Fair    PT Frequency  2x / week    PT Duration  6 weeks    PT Treatment/Interventions  ADLs/Self Care Home Management;Cryotherapy;Ultrasound;Stair training;Balance training;Dry needling;Neuromuscular re-education;Functional mobility training;Electrical Stimulation;Moist Heat;Therapeutic activities;Therapeutic exercise;Patient/family education;Manual techniques;Passive range of motion;Taping;Iontophoresis 4mg /ml Dexamethasone    PT Next Visit Plan  check HEP, manual to L hip, DN and progress core/hip strength, consider Pilates    PT Home Exercise Plan  piriformis, hamstring, ITB stretch    Consulted and Agree with Plan of Care  Patient       Patient will benefit from skilled therapeutic intervention in order to improve the following deficits and impairments:  Decreased balance, Impaired flexibility, Increased fascial restricitons, Postural dysfunction, Pain, Decreased strength, Improper body mechanics, Decreased range of motion  Visit Diagnosis: 1. Pain in left hip   2. Other muscle spasm        Problem List Patient Active Problem List   Diagnosis Date Noted  . Contact dermatitis 09/30/2018  . Traumatic complete tear of right rotator cuff 09/30/2018  . Chronic right shoulder pain 09/30/2018  . Muscle  cramp, nocturnal 08/08/2018  . Low HDL (  under 40) 04/04/2018  . Reactive airway disease 12/17/2017  . Vitamin D deficiency 04/12/2017  . Osteopenia 04/12/2017  . HTN (hypertension) 03/12/2017  . Hyperlipemia, mixed 03/12/2017  . Cataracts, bilateral- s/p surgery. 03/12/2017  . Hypothyroidism 03/12/2017  . Environmental and seasonal allergies 03/12/2017  . Adjustment disorder 03/12/2017  . GERD (gastroesophageal reflux disease) 03/12/2017  . Family history of multiple cancers 03/12/2017  . History of tobacco abuse 03/12/2017  . Allergic rhinitis 03/23/2015  . Cough variant asthma 02/23/2015  . Cancer of right breast, stage 0 09/11/2011  . Insomnia 09/11/2011  . Arthralgia 09/11/2011    Carney Living PT DPT  04/03/2019, 3:53 PM  [facility] [address] [place], [place], [address] Phone: [phone]   Fax:  [phone]  Name: Lindsey Price MRN: [id number] Date of Birth: [birth year]

## 2019-04-08 ENCOUNTER — Encounter: Payer: Self-pay | Admitting: Physical Therapy

## 2019-04-08 ENCOUNTER — Other Ambulatory Visit: Payer: Self-pay

## 2019-04-08 ENCOUNTER — Ambulatory Visit: Payer: PPO | Attending: Family Medicine | Admitting: Physical Therapy

## 2019-04-08 DIAGNOSIS — M62838 Other muscle spasm: Secondary | ICD-10-CM

## 2019-04-08 DIAGNOSIS — M25552 Pain in left hip: Secondary | ICD-10-CM

## 2019-04-09 ENCOUNTER — Ambulatory Visit (INDEPENDENT_AMBULATORY_CARE_PROVIDER_SITE_OTHER): Payer: PPO | Admitting: Family Medicine

## 2019-04-09 ENCOUNTER — Encounter: Payer: Self-pay | Admitting: Family Medicine

## 2019-04-09 ENCOUNTER — Encounter: Payer: Self-pay | Admitting: Physical Therapy

## 2019-04-09 VITALS — BP 121/75 | HR 88 | Temp 98.7°F | Ht 67.0 in | Wt 152.6 lb

## 2019-04-09 DIAGNOSIS — Z7189 Other specified counseling: Secondary | ICD-10-CM | POA: Diagnosis not present

## 2019-04-09 DIAGNOSIS — M7918 Myalgia, other site: Secondary | ICD-10-CM

## 2019-04-09 DIAGNOSIS — G8929 Other chronic pain: Secondary | ICD-10-CM

## 2019-04-09 DIAGNOSIS — M25511 Pain in right shoulder: Secondary | ICD-10-CM

## 2019-04-09 DIAGNOSIS — M25552 Pain in left hip: Secondary | ICD-10-CM | POA: Diagnosis not present

## 2019-04-09 NOTE — Therapy (Signed)
[place], [place], [address] Phone: [phone]   Fax:  [phone]  Physical Therapy Treatment  Patient Details  Name: Lindsey Price MRN: [id number] Date of Birth: May 07, 1946 Referring Provider (PT): Mellody Dance, DO    Encounter Date: 04/08/2019  PT End of Session - 04/08/19 1551    Visit Number  4    Number of Visits  12    Date for PT Re-Evaluation  04/16/19    PT Start Time  6195    PT Stop Time  1629    PT Time Calculation (min)  44 min    Activity Tolerance  Patient tolerated treatment well    Behavior During Therapy  [facility] for tasks assessed/performed       Past Medical History:  Diagnosis Date  . Arthralgia 09/11/2011  . Asthma   . Breast cancer ([place])   . 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 Hyperlipidemia   . Hypertension   . Insomnia   . Thyroid disease    hypothyroidism    Past Surgical History:  Procedure Laterality Date  . ABDOMINAL HYSTERECTOMY  1975   partial  . BREAST LUMPECTOMY  2011   right breast  . COLONOSCOPY WITH PROPOFOL N/A 04/14/2014   Procedure: COLONOSCOPY WITH PROPOFOL;  Surgeon: Garlan Fair, MD;  Location: [facility];  Service: Endoscopy;  Laterality: N/A;  . ROTATOR CUFF REPAIR Left 04/2013   Dr. Para March    There were no vitals filed for this visit.  Subjective Assessment - 04/08/19 1550    Subjective  Patient reports her hip has been feeling better. She has been riding the bike without pain. She has had a little pain getting in and out of the car.    Pertinent History  Breast Cancer, hypothyroid, HTN, Hyperlipidemia    Limitations  Walking;House hold activities;Sitting    How long can you sit comfortably?  not limited in sitting, only when she goes to get up    How long can you walk comfortably?  4-5 miles with pain 4/10.    Diagnostic tests  none    Patient Stated Goals  Patient would like to be able to do what i want without  pain    Currently in Pain?  No/denies                       [facility] Adult PT Treatment/Exercise - 04/09/19 0001      Knee/Hip Exercises: Stretches   Active Hamstring Stretch Limitations  2x30 sec hold     Piriformis Stretch Limitations  2 way 2x30 sec hold       Knee/Hip Exercises: Supine   Bridges  2 sets;10 reps    Bridges Limitations  bridge on ball x15     Straight Leg Raises Limitations  2x10    Other Supine Knee/Hip Exercises  supine clamshell 2x10 green     Other Supine Knee/Hip Exercises  quadruped alt LE x6 each       Manual Therapy   Manual Therapy  Soft tissue mobilization;Manual Traction;Joint mobilization    Joint Mobilization  posterior hip mobilization for end range hip flexion Grade IV     Soft tissue mobilization  to pririformis and glut medius using iastym and     Manual Traction  LAD  3x30 sec bilateral        Trigger Point Dry Needling - 04/09/19 0001    Consent Given?  Yes    Education Handout Provided  Yes    Muscles Treated Back/Hip  Gluteus medius    Dry Needling Comments  3 spots with 30x50    Piriformis Response  Twitch response elicited;Palpable increased muscle length           PT Education - 04/08/19 1551    Education Details  benefits and risks of TPDN    Person(s) Educated  Patient    Methods  Explanation;Demonstration;Tactile cues;Verbal cues    Comprehension  Verbalized understanding;Returned demonstration;Verbal cues required;Tactile cues required          PT Long Term Goals - 03/06/19 0558      PT LONG TERM GOAL #1   Title  Patient will be I with HEP for ROM, strength and fitness    Time  6    Period  Weeks    Status  New    Target Date  04/16/19      PT LONG TERM GOAL #2   Title  Pt will score <18% on FOTO to demo functional improvement    Baseline  23%    Time  6    Period  Weeks    Status  New    Target Date  04/16/19      PT LONG TERM GOAL #3   Title  Pt will be able to go up and down stairs  without pain in L hip, very light use of hand rail for safety.    Time  6    Period  Weeks    Status  New    Target Date  04/16/19      PT LONG TERM GOAL #4   Title  Pt will be able to walk/exercise for 1 hour with pain in L hip <2/10 easily relieved by rest, stretching.    Time  6    Period  Weeks    Status  New    Target Date  04/16/19      PT LONG TERM GOAL #5   Title  Pt will be able to perform gardening, housework without increased pain in LLE    Time  6    Period  Weeks    Status  New    Target Date  04/16/19            Plan - 04/08/19 1552    Clinical Impression Statement  Patient spasming has reduced significantly. She continues to have some tenderness to palpation in the upper glutes. Therapy used dry needling to recuce the spasming. She had a good twitch response with all needles. She tolerated harder exercises today well. Therapy updated her HEP. She did have some cramping in her opposite hamstring with her SLR.    Examination-Activity Limitations  Stairs;Locomotion Level;Squat;Lift;Bend;Transfers    Stability/Clinical Decision Making  Stable/Uncomplicated    Clinical Decision Making  Moderate    Rehab Potential  Fair    PT Frequency  2x / week    PT Duration  6 weeks    PT Treatment/Interventions  ADLs/Self Care Home Management;Cryotherapy;Ultrasound;Stair training;Balance training;Dry needling;Neuromuscular re-education;Functional mobility training;Electrical Stimulation;Moist Heat;Therapeutic activities;Therapeutic exercise;Patient/family education;Manual techniques;Passive range of motion;Taping;Iontophoresis 4mg /ml Dexamethasone    PT Next Visit Plan  check HEP, manual to L hip, DN and progress core/hip strength, consider Pilates    PT Home Exercise Plan  piriformis, hamstring, ITB stretch    Consulted and Agree with Plan of Care  Patient       Patient will benefit from skilled therapeutic intervention  in order to improve the following deficits and  impairments:  Decreased balance, Impaired flexibility, Increased fascial restricitons, Postural dysfunction, Pain, Decreased strength, Improper body mechanics, Decreased range of motion  Visit Diagnosis: 1. Pain in left hip   2. Other muscle spasm        Problem List Patient Active Problem List   Diagnosis Date Noted  . Contact dermatitis 09/30/2018  . Traumatic complete tear of right rotator cuff 09/30/2018  . Chronic right shoulder pain 09/30/2018  . Muscle cramp, nocturnal 08/08/2018  . Low HDL (under 40) 04/04/2018  . Reactive airway disease 12/17/2017  . Vitamin D deficiency 04/12/2017  . Osteopenia 04/12/2017  . HTN (hypertension) 03/12/2017  . Hyperlipemia, mixed 03/12/2017  . Cataracts, bilateral- s/p surgery. 03/12/2017  . Hypothyroidism 03/12/2017  . Environmental and seasonal allergies 03/12/2017  . Adjustment disorder 03/12/2017  . GERD (gastroesophageal reflux disease) 03/12/2017  . Family history of multiple cancers 03/12/2017  . History of tobacco abuse 03/12/2017  . Allergic rhinitis 03/23/2015  . Cough variant asthma 02/23/2015  . Cancer of right breast, stage 0 09/11/2011  . Insomnia 09/11/2011  . Arthralgia 09/11/2011    Carney Living PT DPT  04/09/2019, 8:35 AM  [facility] [address] [place], [place], [address] Phone: [phone]   Fax:  [phone]  Name: Billye Pickerel MRN: [id number] Date of Birth: 06-Nov-1945

## 2019-04-09 NOTE — Progress Notes (Signed)
Impression and Recommendations:    1. Chronic right shoulder pain   2. Chronic left hip pain   3. Piriformis muscle pain     Chronic Right Shoulder Pain - Discussed that "we treat people, not test results." - Since patient's symptoms are a result of chronic changes over time, not caused by acute injury, reviewed importance of conservative treatment.  - Discussed that if patient is able to live with her chronic symptoms without significant change in quality of life, she does not need surgical repair of the area at this time.  Explained to patient it is important to keep the muscles strong around the joint that are helping compensate and that this will never "heal "itself.  She knows if symptoms worsen, she can always consult orthopedics in the future again.   - Extensive education was provided.  All questions were answered.  - With patient's permission, I will reach out to 1 of my orthopedic buddies who does a lot of shoulders for second opinion.  - Patient consented for Dr. Raliegh Scarlet to share MRI report with another physician today. - We will obtain MRI report from [facility], where pt had it done  - If patient's sx worsen, she knows to let us know.  - Will continue to monitor.  Chronic Left Hip Pain - Per pt, sx alleviated by dry needling at PT office we sent her to. - Continue treatment plan as established. - Will continue to monitor.  Please see AVS handed out to patient at the end of our visit for further patient instructions/ counseling done pertaining to today's office visit.  Pt was in the office today for 20.5+ minutes, with over 50% time spent in face to face counseling of patients various medical conditions, treatment plans of those medical conditions including medicine management and lifestyle modification, strategies to improve health and well being; and in coordination of care. SEE ABOVE TREATMENT PLAN FOR DETAILS.   Return if symptoms worsen or fail to  improve, for Follow-up for chronic care as previously discussed.     Note:  This note was prepared with assistance of Dragon voice recognition software. Occasional wrong-word or sound-a-like substitutions may have occurred due to the inherent limitations of voice recognition software.    This document serves as a record of services personally performed by Mellody Dance, DO. It was created on her behalf by [name], a trained medical scribe. The creation of this record is based on the scribe's personal observations and the provider's statements to them.   I have reviewed the above medical documentation for accuracy and completeness and I concur.  Mellody Dance, DO 04/16/2019 6:35 PM       --------------------------------------------------------------------------------------------------------------------------------------------------------------------------------------------------------------------------------------------    Subjective:     HPI: Lindsey Price is a 73 y.o. female who presents to [facility] [facility] today for issues as discussed below.  Exercise & Lifestyle Habits Exercises for an hour per day, walking and the bicycle.  Notes she went to the beach 2-3 weeks ago and she walked an hour every day.  Says her favorite beach is [place] but they went to [facility] most recently.  Chronic Right Shoulder Pain States that surgeon told patient that her shoulder may get worse and may not be able to be repaired if she does not have surgery in the very near future.  Patient wants to know if she absolutely needs surgery or not.  Patient says after her past  shoulder surgery, she does not want surgery done again if at all possible.  Says the pain in the right shoulder is "no worse" these days, and while she can usually sleep through the night without difficulty, sometimes she wakes up in pain.  However, despite this, she notes "I can do  anything I want to" with the shoulder in question, and has no diminished quality of life.  Patient says she thinks her shoulder damage is "from wear and tear, I think," and is able to live fine with it.  Notes repeatedly that while her pain comes and goes, it isn't unbearable.  When she first noticed the pain about a year ago, there were times she couldn't lift her arm, but continually confirms that this has not been a permanent concern, such as "when I went to get my MRI, my arm felt fine."  Hip - Dry Needling Going Well Says her hip is doing wonderfully for the past two weeks.  Has been engaged in dry needling.  Says it hurts a little bit afterwards, sometimes into the next day, but after this the hip feels much better.  States she "hasn't taken any Advil since she went to therapy."    Wt Readings from Last 3 Encounters:  04/09/19 152 lb 9.6 oz (69.2 kg)  02/20/19 149 lb 4.8 oz (67.7 kg)  09/30/18 147 lb 8 oz (66.9 kg)   BP Readings from Last 3 Encounters:  04/09/19 121/75  02/20/19 (!) 150/69  09/30/18 123/81   Pulse Readings from Last 3 Encounters:  04/09/19 88  02/20/19 86  09/30/18 87   BMI Readings from Last 3 Encounters:  04/09/19 23.90 kg/m  02/20/19 23.38 kg/m  09/30/18 23.10 kg/m     Patient Care Team    Relationship Specialty Notifications Start End  Mellody Dance, DO PCP - General Family Medicine  03/12/17   Juanito Doom, MD Consulting Physician Pulmonary Disease  03/12/17   Elsie Saas, MD Consulting Physician Orthopedic Surgery  03/12/17    Comment: L rot cuff repair  Allyn Kenner, MD Consulting Physician Dermatology  03/12/17    Comment: sister with sebacious cell CA  Katy Apo, MD Consulting Physician Ophthalmology  03/12/17   Jovita Kussmaul, MD Consulting Physician General Surgery  03/12/17   Nicholas Lose, [place] Physician Hematology and Oncology  03/12/17   Garlan Fair, MD Consulting Physician Gastroenterology  04/12/17      Patient  Active Problem List   Diagnosis Date Noted  . Low HDL (under 40) 04/04/2018    Priority: High  . HTN (hypertension) 03/12/2017    Priority: High  . Hyperlipemia, mixed 03/12/2017    Priority: High  . Hypothyroidism 03/12/2017    Priority: Medium  . Adjustment disorder 03/12/2017    Priority: Medium  . GERD (gastroesophageal reflux disease) 03/12/2017    Priority: Medium  . Cough variant asthma 02/23/2015    Priority: Medium  . Cancer of right breast, stage 0 09/11/2011    Priority: Medium  . Vitamin D deficiency 04/12/2017    Priority: Low  . Osteopenia 04/12/2017    Priority: Low  . Environmental and seasonal allergies 03/12/2017    Priority: Low  . Family history of multiple cancers 03/12/2017    Priority: Low  . History of tobacco abuse 03/12/2017    Priority: Low  . Insomnia 09/11/2011    Priority: Low  . Contact dermatitis 09/30/2018  . Traumatic complete tear of right rotator cuff 09/30/2018  . Chronic  right shoulder pain 09/30/2018  . Muscle cramp, nocturnal 08/08/2018  . Reactive airway disease 12/17/2017  . Cataracts, bilateral- s/p surgery. 03/12/2017  . Allergic rhinitis 03/23/2015  . Arthralgia 09/11/2011    Past Medical history, Surgical history, Family history, Social history, Allergies and Medications have been entered into the medical record, reviewed and changed as needed.    Current Meds  Medication Sig  . beclomethasone (QVAR REDIHALER) 80 MCG/ACT inhaler Inhale 2 puffs into the lungs 2 (two) times daily. (Patient taking differently: Inhale 2 puffs into the lungs as needed. )  . cetirizine (ZYRTEC) 10 MG tablet Take 10 mg by mouth as needed.   . ezetimibe (ZETIA) 10 MG tablet TAKE 1/2 (ONE HALF) TABLET BY MOUTH AT BEDTIME  . levothyroxine (SYNTHROID) 88 MCG tablet TAKE 1 TABLET BY MOUTH EVERY OTHER DAY ALTERNATING WITH 100 MCG TABLETS  . levothyroxine (SYNTHROID, LEVOTHROID) 100 MCG tablet 1 tablet every other day alternating with 88 mcg tablet  .  losartan (COZAAR) 50 MG tablet Take 1 tablet (50 mg total) by mouth daily.  . montelukast (SINGULAIR) 10 MG tablet TAKE 1 TABLET BY MOUTH EVERY DAY FOR ALLERGIES AND ASTHMA  . simvastatin (ZOCOR) 5 MG tablet TAKE 1 TABLET BY MOUTH EVERYDAY AT BEDTIME  . triamcinolone cream (KENALOG) 0.1 % Apply 1 application topically 2 (two) times daily. RF per Derm-Dr [place] Crane  . venlafaxine XR (EFFEXOR-XR) 75 MG 24 hr capsule TAKE 1 CAPSULE (75 MG TOTAL) BY MOUTH AT BEDTIME.  Marland Kitchen Vitamin D, Ergocalciferol, (DRISDOL) 50000 units CAPS capsule TAKE 1 CAPSULE BY MOUTH ONCE WEEKLY    Allergies:  Allergies  Allergen Reactions  . Advair Diskus [Fluticasone-Salmeterol]     Hoarseness.  . Neosporin [Neomycin-Polymyxin-Gramicidin] Itching     Review of Systems:  A fourteen system review of systems was performed and found to be positive as per HPI.   Objective:   Blood pressure 121/75, pulse 88, temperature 98.7 F (37.1 C), height 5\' 7"  (1.702 m), weight 152 lb 9.6 oz (69.2 kg), SpO2 97 %. Body mass index is 23.9 kg/m. General:  Well Developed, well nourished, appropriate for stated age.  Neuro:  Alert and oriented,  extra-ocular muscles intact  HEENT:  Normocephalic, atraumatic, neck supple, no carotid bruits appreciated  Skin:  no gross rash, warm, pink. Cardiac:  RRR, S1 S2 Respiratory:  ECTA B/L and A/P, Not using accessory muscles, speaking in full sentences- unlabored. Vascular:  Ext warm, no cyanosis apprec.; cap RF less 2 sec. Psych:  No HI/SI, judgement and insight good, Euthymic mood. Full Affect.

## 2019-04-10 ENCOUNTER — Other Ambulatory Visit: Payer: Self-pay

## 2019-04-10 ENCOUNTER — Ambulatory Visit: Payer: PPO | Admitting: Physical Therapy

## 2019-04-10 ENCOUNTER — Encounter: Payer: Self-pay | Admitting: Physical Therapy

## 2019-04-10 DIAGNOSIS — M25552 Pain in left hip: Secondary | ICD-10-CM | POA: Diagnosis not present

## 2019-04-10 DIAGNOSIS — M62838 Other muscle spasm: Secondary | ICD-10-CM

## 2019-04-10 NOTE — Therapy (Signed)
[place], [place], [address] Phone: [phone]   Fax:  [phone]  Physical Therapy Treatment  Patient Details  Name: Lindsey Price MRN: [id number] Date of Birth: 30-Jul-1946 Referring Provider (PT): Mellody Dance, DO    Encounter Date: 04/10/2019  PT End of Session - 04/10/19 1539    Visit Number  5    Number of Visits  12    Date for PT Re-Evaluation  04/16/19    Authorization Type  health stream    PT Start Time  1015    PT Stop Time  1058    PT Time Calculation (min)  43 min    Activity Tolerance  Patient tolerated treatment well    Behavior During Therapy  [facility] for tasks assessed/performed       Past Medical History:  Diagnosis Date  . Arthralgia 09/11/2011  . Asthma   . Breast cancer ([place])   . 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 Hyperlipidemia   . Hypertension   . Insomnia   . Thyroid disease    hypothyroidism    Past Surgical History:  Procedure Laterality Date  . ABDOMINAL HYSTERECTOMY  1975   partial  . BREAST LUMPECTOMY  2011   right breast  . COLONOSCOPY WITH PROPOFOL N/A 04/14/2014   Procedure: COLONOSCOPY WITH PROPOFOL;  Surgeon: Garlan Fair, MD;  Location: [facility];  Service: Endoscopy;  Laterality: N/A;  . ROTATOR CUFF REPAIR Left 04/2013   Dr. Para March    There were no vitals filed for this visit.  Subjective Assessment - 04/10/19 1043    Subjective  Patient reports some soreness after her last visit. She was feeling better yesterday but is sore after the drive from [place]. She has been owrking on her stretches and exercises.    Pertinent History  Breast Cancer, hypothyroid, HTN, Hyperlipidemia    Limitations  Walking;House hold activities;Sitting    How long can you sit comfortably?  not limited in sitting, only when she goes to get up    How long can you walk comfortably?  4-5 miles with pain 4/10.    Diagnostic tests  none    Patient  Stated Goals  Patient would like to be able to do what i want without pain    Currently in Pain?  No/denies                       [facility] Adult PT Treatment/Exercise - 04/10/19 0001      Knee/Hip Exercises: Stretches   Active Hamstring Stretch Limitations  2x30 sec hold     Piriformis Stretch Limitations  2 way 2x30 sec hold       Knee/Hip Exercises: Standing   Hip Abduction  2 sets;10 reps    Hip Extension  2 sets;10 reps    SLS  4x10 sec hold with cuing  to not shift and use hands as needed.,      Knee/Hip Exercises: Supine   Bridges  2 sets;10 reps    Bridges Limitations  bridge on ball x15     Straight Leg Raises Limitations  2x10    Other Supine Knee/Hip Exercises  supine clamshell 2x10 green       Manual Therapy   Manual Therapy  Soft tissue mobilization;Manual Traction;Joint mobilization;Muscle Energy Technique    Joint Mobilization  posterior hip mobilization for end range hip flexion Grade IV     Soft tissue mobilization  to pririformis and glut medius using iastym and     Manual Traction  LAD  3x30 sec bilateral     Muscle Energy Technique  SI MET using the glut              PT Education - 04/10/19 1539    Education Details  reviewed HEP and technique with exercises    Person(s) Educated  Patient    Methods  Explanation;Demonstration;Tactile cues;Verbal cues    Comprehension  Verbalized understanding;Returned demonstration;Verbal cues required;Tactile cues required          PT Long Term Goals - 04/10/19 1558      PT LONG TERM GOAL #1   Title  Patient will be I with HEP for ROM, strength and fitness    Time  6    Period  Weeks    Status  On-going      PT LONG TERM GOAL #2   Title  Pt will score <18% on FOTO to demo functional improvement    Baseline  23%    Time  6    Period  Weeks    Status  On-going      PT LONG TERM GOAL #3   Title  Pt will be able to go up and down stairs without pain in L hip, very light use of hand rail for  safety.    Time  6    Period  Weeks    Status  On-going      PT LONG TERM GOAL #4   Title  Pt will be able to walk/exercise for 1 hour with pain in L hip <2/10 easily relieved by rest, stretching.    Time  6    Period  Weeks    Status  On-going      PT LONG TERM GOAL #5   Title  Pt will be able to perform gardening, housework without increased pain in LLE    Time  6    Period  Weeks    Status  On-going            Plan - 04/10/19 1540    Clinical Impression Statement  Improved spasming in the glutes. Increased spasming in the sacral area. Therapy used a gluteal MET for the scarum. She reported improved pain as she did it. Therapy also gave the patient single leg stability to work on at home. She has a significant differance in her left signle leg stance compared to her right.    Personal Factors and Comorbidities  Age;Comorbidity 1;Behavior Pattern    Examination-Activity Limitations  Stairs;Locomotion Level;Squat;Lift;Bend;Transfers    Examination-Participation Restrictions  Other;Yard Work    Stability/Clinical Decision Making  Stable/Uncomplicated    Clinical Decision Making  Moderate    Rehab Potential  Fair    PT Duration  6 weeks    PT Treatment/Interventions  ADLs/Self Care Home Management;Cryotherapy;Ultrasound;Stair training;Balance training;Dry needling;Neuromuscular re-education;Functional mobility training;Electrical Stimulation;Moist Heat;Therapeutic activities;Therapeutic exercise;Patient/family education;Manual techniques;Passive range of motion;Taping;Iontophoresis '4mg'$ /ml Dexamethasone    PT Next Visit Plan  continue with trigger point needling. Conetinue to progress standing exercises.    PT Home Exercise Plan  piriformis, hamstring, ITB stretch    Consulted and Agree with Plan of Care  Patient       Patient will benefit from skilled therapeutic intervention in order to improve the following deficits and impairments:  Decreased balance, Impaired flexibility,  Increased fascial restricitons, Postural dysfunction, Pain, Decreased strength, Improper body mechanics, Decreased range of motion  Visit Diagnosis:  1. Pain in left hip   2. Other muscle spasm        Problem List Patient Active Problem List   Diagnosis Date Noted  . Contact dermatitis 09/30/2018  . Traumatic complete tear of right rotator cuff 09/30/2018  . Chronic right shoulder pain 09/30/2018  . Muscle cramp, nocturnal 08/08/2018  . Low HDL (under 40) 04/04/2018  . Reactive airway disease 12/17/2017  . Vitamin D deficiency 04/12/2017  . Osteopenia 04/12/2017  . HTN (hypertension) 03/12/2017  . Hyperlipemia, mixed 03/12/2017  . Cataracts, bilateral- s/p surgery. 03/12/2017  . Hypothyroidism 03/12/2017  . Environmental and seasonal allergies 03/12/2017  . Adjustment disorder 03/12/2017  . GERD (gastroesophageal reflux disease) 03/12/2017  . Family history of multiple cancers 03/12/2017  . History of tobacco abuse 03/12/2017  . Allergic rhinitis 03/23/2015  . Cough variant asthma 02/23/2015  . Cancer of right breast, stage 0 09/11/2011  . Insomnia 09/11/2011  . Arthralgia 09/11/2011    Carney Living PT DPT  04/10/2019, 4:15 PM  [facility] [address] [place], [place], [address] Phone: [phone]   Fax:  [phone]  Name: Lindsey Price MRN: [id number] Date of Birth: 07/09/1946

## 2019-04-15 ENCOUNTER — Encounter: Payer: Self-pay | Admitting: Physical Therapy

## 2019-04-15 ENCOUNTER — Other Ambulatory Visit: Payer: Self-pay

## 2019-04-15 ENCOUNTER — Ambulatory Visit: Payer: PPO | Admitting: Physical Therapy

## 2019-04-15 DIAGNOSIS — M62838 Other muscle spasm: Secondary | ICD-10-CM

## 2019-04-15 DIAGNOSIS — M25552 Pain in left hip: Secondary | ICD-10-CM | POA: Diagnosis not present

## 2019-04-15 NOTE — Therapy (Addendum)
[place], [place], [address] Phone: [phone]   Fax:  [phone]  Physical Therapy Treatment/Discharge   Patient Details  Name: Lindsey Price MRN: [id number] Date of Birth: April 02, 1946 Referring Provider (PT): Mellody Dance, DO    Encounter Date: 04/15/2019  PT End of Session - 04/15/19 1601    Visit Number  6    Number of Visits  12    Date for PT Re-Evaluation  04/16/19    Authorization Type  health stream    PT Start Time  1332    PT Stop Time  1412    PT Time Calculation (min)  40 min    Activity Tolerance  Patient tolerated treatment well    Behavior During Therapy  [facility] for tasks assessed/performed       Past Medical History:  Diagnosis Date  . Arthralgia 09/11/2011  . Asthma   . Breast cancer ([place])   . 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 Hyperlipidemia   . Hypertension   . Insomnia   . Thyroid disease    hypothyroidism    Past Surgical History:  Procedure Laterality Date  . ABDOMINAL HYSTERECTOMY  1975   partial  . BREAST LUMPECTOMY  2011   right breast  . COLONOSCOPY WITH PROPOFOL N/A 04/14/2014   Procedure: COLONOSCOPY WITH PROPOFOL;  Surgeon: Garlan Fair, MD;  Location: [facility];  Service: Endoscopy;  Laterality: N/A;  . ROTATOR CUFF REPAIR Left 04/2013   Dr. Para March    There were no vitals filed for this visit.  Subjective Assessment - 04/15/19 1336    Subjective  to walk and raide her bike over the weekend. She was a little stiff driving up here.    Pertinent History  Breast Cancer, hypothyroid, HTN, Hyperlipidemia    Limitations  Walking;House hold activities;Sitting    How long can you sit comfortably?  not limited in sitting, only when she goes to get up                       [facility] Adult PT Treatment/Exercise - 04/15/19 0001      Knee/Hip Exercises: Aerobic   Elliptical  5 min pulling but no increase in pain        Knee/Hip Exercises: Standing   Hip Abduction  2 sets;10 reps    Hip Extension  2 sets;10 reps    Forward Step Up Limitations  2x10 4 inch     SLS  4x10 sec hold with cuing  to not shift and use hands as needed.,      Knee/Hip Exercises: Supine   Bridges  2 sets;15 reps    Bridges Limitations  --    Straight Leg Raises Limitations  3x10    Other Supine Knee/Hip Exercises  supine clamshell 2x10 green       Manual Therapy   Joint Mobilization  posterior hip mobilization for end range hip flexion Grade IV     Soft tissue mobilization  to pririformis and glut medius using iastym and     Manual Traction  LAD  3x30 sec bilateral     Muscle Energy Technique  SI MET using the glut              PT Education - 04/15/19 1601    Person(s) Educated  Patient    Methods  Demonstration;Explanation    Comprehension  Returned demonstration;Verbal cues required;Verbalized understanding;Tactile cues required  PT Long Term Goals - 04/15/19 1603      PT LONG TERM GOAL #1   Title  Patient will be I with HEP for ROM, strength and fitness    Time  6    Period  Weeks    Status  On-going      PT LONG TERM GOAL #2   Title  Pt will score <18% on FOTO to demo functional improvement    Baseline  23%    Period  Weeks    Status  On-going      PT LONG TERM GOAL #3   Title  Pt will be able to go up and down stairs without pain in L hip, very light use of hand rail for safety.    Time  6    Period  Weeks    Status  On-going      PT LONG TERM GOAL #4   Title  Pt will be able to walk/exercise for 1 hour with pain in L hip <2/10 easily relieved by rest, stretching.    Time  6    Period  Weeks    Status  On-going      PT LONG TERM GOAL #5   Title  Pt will be able to perform gardening, housework without increased pain in LLE    Time  6    Period  Weeks    Status  On-going            Plan - 04/15/19 1602    Clinical Impression Statement  Patient is making good progress.  Therapy added the ellipticla and step ups. She felt pulling but no increased pain. She continues to have trigger points in her glutes but they have improved. She had improved ability to perfrom single leg stance but it is still limited.    Personal Factors and Comorbidities  Age;Comorbidity 1;Behavior Pattern    Examination-Activity Limitations  Stairs;Locomotion Level;Squat;Lift;Bend;Transfers    Examination-Participation Restrictions  Other;Yard Work    Stability/Clinical Decision Making  Stable/Uncomplicated    Clinical Decision Making  Moderate    Rehab Potential  Fair    PT Frequency  2x / week    PT Duration  6 weeks    PT Treatment/Interventions  ADLs/Self Care Home Management;Cryotherapy;Ultrasound;Stair training;Balance training;Dry needling;Neuromuscular re-education;Functional mobility training;Electrical Stimulation;Moist Heat;Therapeutic activities;Therapeutic exercise;Patient/family education;Manual techniques;Passive range of motion;Taping;Iontophoresis '4mg'$ /ml Dexamethasone    PT Next Visit Plan  continue with trigger point needling. Conetinue to progress standing exercises.    PT Home Exercise Plan  piriformis, hamstring, ITB stretch    Consulted and Agree with Plan of Care  Patient       Patient will benefit from skilled therapeutic intervention in order to improve the following deficits and impairments:  Decreased balance, Impaired flexibility, Increased fascial restricitons, Postural dysfunction, Pain, Decreased strength, Improper body mechanics, Decreased range of motion  Visit Diagnosis: 1. Pain in left hip   2. Other muscle spasm     PHYSICAL THERAPY DISCHARGE SUMMARY  Visits from Start of Care: 6  Current functional level related to goals / functional outcomes: Significant improvement in pain   Remaining deficits: Mild pain with activity    Education / Equipment: HEP   Plan: Patient agrees to discharge.  Patient goals were met. Patient is being discharged  due to being pleased with the current functional level.  ?????       Problem List Patient Active Problem List   Diagnosis Date Noted  . Contact dermatitis 09/30/2018  .  Traumatic complete tear of right rotator cuff 09/30/2018  . Chronic right shoulder pain 09/30/2018  . Muscle cramp, nocturnal 08/08/2018  . Low HDL (under 40) 04/04/2018  . Reactive airway disease 12/17/2017  . Vitamin D deficiency 04/12/2017  . Osteopenia 04/12/2017  . HTN (hypertension) 03/12/2017  . Hyperlipemia, mixed 03/12/2017  . Cataracts, bilateral- s/p surgery. 03/12/2017  . Hypothyroidism 03/12/2017  . Environmental and seasonal allergies 03/12/2017  . Adjustment disorder 03/12/2017  . GERD (gastroesophageal reflux disease) 03/12/2017  . Family history of multiple cancers 03/12/2017  . History of tobacco abuse 03/12/2017  . Allergic rhinitis 03/23/2015  . Cough variant asthma 02/23/2015  . Cancer of right breast, stage 0 09/11/2011  . Insomnia 09/11/2011  . Arthralgia 09/11/2011    Carney Living PT DPT  04/15/2019, 4:06 PM  [facility] [address] [place], [place], [address] Phone: [phone]   Fax:  [phone]  Name: Lindsey Price MRN: [id number] Date of Birth: [birth year]

## 2019-04-16 ENCOUNTER — Ambulatory Visit: Payer: PPO | Admitting: Physical Therapy

## 2019-04-16 DIAGNOSIS — Z7189 Other specified counseling: Secondary | ICD-10-CM | POA: Insufficient documentation

## 2019-04-16 DIAGNOSIS — G8929 Other chronic pain: Secondary | ICD-10-CM | POA: Insufficient documentation

## 2019-04-16 DIAGNOSIS — Z20828 Contact with and (suspected) exposure to other viral communicable diseases: Secondary | ICD-10-CM | POA: Diagnosis not present

## 2019-04-16 DIAGNOSIS — M25552 Pain in left hip: Secondary | ICD-10-CM | POA: Insufficient documentation

## 2019-04-16 DIAGNOSIS — M7918 Myalgia, other site: Secondary | ICD-10-CM | POA: Insufficient documentation

## 2019-04-17 ENCOUNTER — Other Ambulatory Visit: Payer: Self-pay | Admitting: Family Medicine

## 2019-04-17 DIAGNOSIS — I10 Essential (primary) hypertension: Secondary | ICD-10-CM

## 2019-04-23 ENCOUNTER — Encounter: Payer: PPO | Admitting: Physical Therapy

## 2019-04-28 ENCOUNTER — Encounter: Payer: PPO | Admitting: Physical Therapy

## 2019-05-02 ENCOUNTER — Encounter: Payer: PPO | Admitting: Adult Health

## 2019-05-05 DIAGNOSIS — Z03818 Encounter for observation for suspected exposure to other biological agents ruled out: Secondary | ICD-10-CM | POA: Diagnosis not present

## 2019-05-15 DIAGNOSIS — Z961 Presence of intraocular lens: Secondary | ICD-10-CM | POA: Diagnosis not present

## 2019-05-21 ENCOUNTER — Other Ambulatory Visit: Payer: Self-pay | Admitting: Family Medicine

## 2019-05-21 ENCOUNTER — Telehealth: Payer: Self-pay | Admitting: Adult Health

## 2019-05-21 NOTE — Telephone Encounter (Signed)
[place] [facility] 9/28. Moved 9/28 appointments to 9/29. Left message. Schedule mailed.

## 2019-05-26 ENCOUNTER — Ambulatory Visit: Payer: PPO | Admitting: Acute Care

## 2019-06-02 ENCOUNTER — Encounter: Payer: PPO | Admitting: Adult Health

## 2019-06-03 ENCOUNTER — Encounter: Payer: Self-pay | Admitting: Adult Health

## 2019-06-03 ENCOUNTER — Other Ambulatory Visit: Payer: Self-pay

## 2019-06-03 ENCOUNTER — Inpatient Hospital Stay: Payer: PPO | Attending: Adult Health | Admitting: Adult Health

## 2019-06-03 VITALS — BP 120/64 | HR 89 | Temp 98.3°F | Resp 18 | Ht 67.0 in | Wt 148.3 lb

## 2019-06-03 DIAGNOSIS — N951 Menopausal and female climacteric states: Secondary | ICD-10-CM | POA: Diagnosis not present

## 2019-06-03 DIAGNOSIS — M85852 Other specified disorders of bone density and structure, left thigh: Secondary | ICD-10-CM

## 2019-06-03 DIAGNOSIS — R232 Flushing: Secondary | ICD-10-CM | POA: Diagnosis not present

## 2019-06-03 DIAGNOSIS — M8588 Other specified disorders of bone density and structure, other site: Secondary | ICD-10-CM | POA: Diagnosis not present

## 2019-06-03 DIAGNOSIS — Z86000 Personal history of in-situ neoplasm of breast: Secondary | ICD-10-CM | POA: Insufficient documentation

## 2019-06-03 DIAGNOSIS — D0591 Unspecified type of carcinoma in situ of right breast: Secondary | ICD-10-CM

## 2019-06-03 MED ORDER — VENLAFAXINE HCL ER 75 MG PO CP24: 75.0000 mg | ORAL_CAPSULE | Freq: Every day | ORAL | 4 refills | Status: DC

## 2019-06-03 NOTE — Progress Notes (Signed)
CLINIC:  Survivorship   REASON FOR VISIT:  Routine follow-up for history of breast cancer.   BRIEF ONCOLOGIC HISTORY:  Oncology History  Cancer of right breast, stage 0  09/13/2009 Initial Biopsy   Right breast core needle biopsy: High-grade DCIS ER 100%, PR 93%   10/11/2009 Surgery   Right breast lumpectomy: High-grade DCIS with necrosis and microcalcifications 1 SLN negative, ER 100%, PR 93%   03/04/2010 - 03/2015 Anti-estrogen oral therapy   Aromasin 25 mg daily with Effexor 75 mg daily for hot flashes      INTERVAL HISTORY:  Lindsey Price presents to the [facility] today for routine follow-up for her history of breast cancer.  Overall, she reports feeling quite well.   Since her last visit she underwent a bilateral diagnostic mammogram that showed no evidence of malignancy and breast density category B.  She exercises daily.  She sees her PCP regularly.  She is up to date with cancer screenings and her vaccinations.    REVIEW OF SYSTEMS:  Review of Systems  Constitutional: Negative for appetite change, chills, fatigue, fever and unexpected weight change.  HENT:   Negative for hearing loss, lump/mass and trouble swallowing.   Eyes: Negative for eye problems and icterus.  Respiratory: Negative for chest tightness, cough and shortness of breath.   Cardiovascular: Negative for chest pain, leg swelling and palpitations.  Gastrointestinal: Negative for abdominal distention, abdominal pain, constipation, diarrhea, nausea and vomiting.  Endocrine: Negative for hot flashes.  Skin: Negative for itching and rash.  Neurological: Negative for dizziness, extremity weakness, headaches and numbness.  Hematological: Negative for adenopathy. Does not bruise/bleed easily.  Psychiatric/Behavioral: Negative for depression. The patient is not nervous/anxious.   Breast: Denies any new nodularity, masses, tenderness, nipple changes, or nipple discharge.       PAST MEDICAL/SURGICAL  HISTORY:  Past Medical History:  Diagnosis Date  . Arthralgia 09/11/2011  . Asthma   . Breast cancer ([place])   . 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 Hyperlipidemia   . Hypertension   . Insomnia   . Thyroid disease    hypothyroidism   Past Surgical History:  Procedure Laterality Date  . ABDOMINAL HYSTERECTOMY  1975   partial  . BREAST LUMPECTOMY  2011   right breast  . COLONOSCOPY WITH PROPOFOL N/A 04/14/2014   Procedure: COLONOSCOPY WITH PROPOFOL;  Surgeon: Garlan Fair, MD;  Location: [facility];  Service: Endoscopy;  Laterality: N/A;  . ROTATOR CUFF REPAIR Left 04/2013   Dr. Para March     ALLERGIES:  Allergies  Allergen Reactions  . Advair Diskus [Fluticasone-Salmeterol]     Hoarseness.  . Neosporin [Neomycin-Polymyxin-Gramicidin] Itching     CURRENT MEDICATIONS:  Outpatient Encounter Medications as of 06/03/2019  Medication Sig  . beclomethasone (QVAR REDIHALER) 80 MCG/ACT inhaler Inhale 2 puffs into the lungs 2 (two) times daily. (Patient taking differently: Inhale 2 puffs into the lungs as needed. )  . cetirizine (ZYRTEC) 10 MG tablet Take 10 mg by mouth as needed.   . ezetimibe (ZETIA) 10 MG tablet TAKE 1/2 (ONE HALF) TABLET BY MOUTH AT BEDTIME  . levothyroxine (SYNTHROID) 88 MCG tablet TAKE 1 TABLET BY MOUTH EVERY OTHER DAY ALTERNATING WITH 100 MCG TABLETS  . levothyroxine (SYNTHROID, LEVOTHROID) 100 MCG tablet 1 tablet every other day alternating with 88 mcg tablet  . losartan (COZAAR) 50 MG tablet TAKE 1 TABLET BY MOUTH EVERY DAY  . montelukast (SINGULAIR) 10 MG tablet TAKE 1 TABLET BY MOUTH  EVERY DAY FOR ALLERGIES AND ASTHMA  . simvastatin (ZOCOR) 5 MG tablet TAKE 1 TABLET BY MOUTH EVERYDAY AT BEDTIME  . triamcinolone cream (KENALOG) 0.1 % Apply 1 application topically 2 (two) times daily. RF per Derm-Dr [place] Crane  . venlafaxine XR (EFFEXOR-XR) 75 MG 24 hr capsule TAKE 1 CAPSULE (75 MG TOTAL) BY MOUTH AT BEDTIME.  Marland Kitchen Vitamin D, Ergocalciferol,  (DRISDOL) 50000 units CAPS capsule TAKE 1 CAPSULE BY MOUTH ONCE WEEKLY   No facility-administered encounter medications on file as of 06/03/2019.      ONCOLOGIC FAMILY HISTORY:  Family History  Problem Relation Age of Onset  . Cancer Mother        BREAST  . Cancer Father        pancreatic  . Cancer Sister        sebacous cell carcinoma  . Cancer Maternal Aunt        lung  . Cancer Paternal Uncle        colon, stomach    SOCIAL HISTORY:  Social History   Socioeconomic History  . Marital status: Married    Spouse name: Not on file  . Number of children: Not on file  . Years of education: Not on file  . Highest education level: Not on file  Occupational History  . Not on file  Social Needs  . Financial resource strain: Not on file  . Food insecurity    Worry: Not on file    Inability: Not on file  . Transportation needs    Medical: Not on file    Non-medical: Not on file  Tobacco Use  . Smoking status: Former Smoker    Packs/day: 1.00    Years: 15.00    Pack years: 15.00    Types: Cigarettes    Quit date: 12/09/1978    Years since quitting: 40.5  . Smokeless tobacco: Former Systems developer    Quit date: 09/04/1978  Substance and Sexual Activity  . Alcohol use: Yes    Alcohol/week: 0.0 standard drinks    Comment: SOCIALLY wine  . Drug use: No  . Sexual activity: Yes  Lifestyle  . Physical activity    Days per week: Not on file    Minutes per session: Not on file  . Stress: Not on file  Relationships  . Social Herbalist on phone: Not on file    Gets together: Not on file    Attends religious service: Not on file    Active member of club or organization: Not on file    Attends meetings of clubs or organizations: Not on file    Relationship status: Not on file  . Intimate partner violence    Fear of current or ex partner: Not on file    Emotionally abused: Not on file    Physically abused: Not on file    Forced sexual activity: Not on file  Other Topics  Concern  . Not on file  Social History Narrative  . Not on file      PHYSICAL EXAMINATION:  Vital Signs: Vitals:   06/03/19 1018  BP: 120/64  Pulse: 89  Resp: 18  Temp: 98.3 F (36.8 C)  SpO2: 100%   Filed Weights   06/03/19 1018  Weight: 148 lb 4.8 oz (67.3 kg)   General: Well-nourished, well-appearing female in no acute distress.  Unaccompanied today.   HEENT: Head is normocephalic.  Pupils equal and reactive to light. Conjunctivae clear without exudate.  Sclerae anicteric.  Oral mucosa is pink, moist.  Oropharynx is pink without lesions or erythema.  Lymph: No cervical, supraclavicular, or infraclavicular lymphadenopathy noted on palpation.  Cardiovascular: Regular rate and rhythm.Marland Kitchen Respiratory: Clear to auscultation bilaterally. Chest expansion symmetric; breathing non-labored.  Breast Exam:  -Left breast: No appreciable masses on palpation. No skin redness, thickening, or peau d'orange appearance; no nipple retraction or nipple discharge;   -Right breast: No appreciable masses on palpation. No skin redness, thickening, or peau d'orange appearance; no nipple retraction or nipple discharge; mild distortion in symmetry at previous lumpectomy site well healed scar without erythema or nodularity. -Axilla: No axillary adenopathy bilaterally.  GI: Abdomen soft and round; non-tender, non-distended. Bowel sounds normoactive. No hepatosplenomegaly.   GU: Deferred.  Neuro: No focal deficits. Steady gait.  Psych: Mood and affect normal and appropriate for situation.  MSK: No focal spinal tenderness to palpation, full range of motion in bilateral upper extremities Extremities: No edema. Skin: Warm and dry.  LABORATORY DATA:  None for this visit   DIAGNOSTIC IMAGING:  Most recent mammogram:      ASSESSMENT AND PLAN:  Ms.. Vuu is a pleasant 73 y.o. female with history of Stage 0 right breast DCIS, ER+/PR+, diagnosed in 09/2009, treated with lumpectomy, and anti-estrogen  therapy with Aromasin x 5 years completing therapy in 03/2015.  [facility] for surveillance and routine follow-up.   1. History of breast cancer:  Lindsey Price is currently clinically and radiographically without evidence of disease or recurrence of breast cancer. She will be due for mammogram in 09/2019; orders placed today.  She prefers diagnostic bilateral mammograms instead of screening mammograms, and I placed orders for this today.  She understands and is willing to pay the balance should insurance not cover the test.  She will return in 1 year for LTS follow up.  I encouraged her to call me with any questions or concerns before her next visit at the cancer center, and I would be happy to see her sooner, if needed.    2. Hot flashes: I refilled her Effexor.  She continues to tolerate this well.  3. Bone health:  Given Ms. Lindenberger's age, history of breast cancer, and her current anti-estrogen therapy with , she is at risk for bone demineralization. Her last DEXA scan was on 05/29/2017 and indicated osteopenia with a t score of -1.1 in the left femur.  She is taking caclium, Vitamin D, and exercises regularly. She was given education on specific food and activities to promote bone health.  I placed orders for her repeat DEXA in 09/2019 to be done when she has her mammogram.  4. Cancer screening:  Due to Ms. Greenup's history and her age, she should receive screening for skin cancers, colon cancer. She was encouraged to follow-up with her PCP for appropriate cancer screenings.   5. Health maintenance and wellness promotion: Ms. Babbit was encouraged to consume 5-7 servings of fruits and vegetables per day. She was also encouraged to engage in moderate to vigorous exercise for 30 minutes per day most days of the week. She was instructed to limit her alcohol consumption and continue to abstain from tobacco use.    Dispo:  -Return to cancer center in one year for LTS follow up  -Mammogram in 09/2019 -DEXA in 09/2019 -She was recommended to follow appropriate pandemic precautions -She knows to call for any questions that may arise between now and her next appointment.     A total of (30)  minutes of face-to-face time was spent with this patient with greater than 50% of that time in counseling and care-coordination.   Gardenia Phlegm, NP Survivorship [facility] [phone]   Note: PRIMARY CARE PROVIDER Mellody Dance, [place] [phone]

## 2019-06-04 ENCOUNTER — Telehealth: Payer: Self-pay | Admitting: Adult Health

## 2019-06-04 NOTE — Telephone Encounter (Signed)
I talk with patient regarding schedule  

## 2019-06-09 ENCOUNTER — Other Ambulatory Visit: Payer: Self-pay

## 2019-06-09 ENCOUNTER — Encounter: Payer: Self-pay | Admitting: Acute Care

## 2019-06-09 ENCOUNTER — Ambulatory Visit: Payer: PPO | Admitting: Acute Care

## 2019-06-09 DIAGNOSIS — J45991 Cough variant asthma: Secondary | ICD-10-CM | POA: Diagnosis not present

## 2019-06-09 DIAGNOSIS — K219 Gastro-esophageal reflux disease without esophagitis: Secondary | ICD-10-CM

## 2019-06-09 DIAGNOSIS — J309 Allergic rhinitis, unspecified: Secondary | ICD-10-CM | POA: Diagnosis not present

## 2019-06-09 MED ORDER — QVAR REDIHALER 80 MCG/ACT IN AERB: 2.0000 | INHALATION_SPRAY | Freq: Two times a day (BID) | RESPIRATORY_TRACT | 5 refills | Status: DC

## 2019-06-09 MED ORDER — BECLOMETHASONE DIPROPIONATE 80 MCG/ACT IN AERS: 2.0000 | INHALATION_SPRAY | Freq: Two times a day (BID) | RESPIRATORY_TRACT | 0 refills | Status: DC

## 2019-06-09 NOTE — Assessment & Plan Note (Addendum)
Mild flare Plan We will change your Zyrtec to Xyzol once daily Continue using your Singulair, and Flonase as you have been doing Saline rinses as needed Follow up in 3 months with Lindsey Roch NP or Dr. Loletta Price  Please contact office for sooner follow up if symptoms do not improve or worsen or seek emergency care

## 2019-06-09 NOTE — Assessment & Plan Note (Signed)
Has not been using PPI Plan Re-start you Nexium 20 mg daily Add 20 mg  pepcid if needed  GERD diet Follow up in 3 months with Judson Roch NP or Dr. Loletta Specter  Please contact office for sooner follow up if symptoms do not improve or worsen or seek emergency care

## 2019-06-09 NOTE — Progress Notes (Signed)
History of Present Illness Lindsey Price is a 73 y.o. female former smoker ( Quit [phone] with a 15 pack / year smoking history) with cough variant asthma. She is followed by Dr. Lake Bells.   06/09/2019  Pt presents for follow up. She was last in the office 05/28/2017. At that time she was taking Q Var Oct-Mar only as needed per Dr. Anastasia Pall instructions. She present today stating she has been doing well for the last 2 years without Qvar. She is compliant with her Singulair and Zyrtec. She had a cold 03/2019, which self resolved, but she has had a lingering cough. Her husband tested positive for Covid  August 12th, and she has been in Theme park manager. She quarantined for 28 days. She states she wanted to be checked out as she had been sick with the lingering cough.She suspects she may need to resume Q Var. The one she has at present is expired.  She denies any wheezing or change in her secretions.She endorses some post nasal drip. Her cough is worse in the mornings after she wakes up.She does not have any significant secretions. She states she has been off her Nexium for 2 years, and off her Q var for 2 years. She has not used it because she has been doing well. She denies any fever, chest pain, orthopnea or hemoptysis.  Test Results:  CBC Latest Ref Rng & Units 02/06/2019 03/28/2018 04/09/2017  WBC 3.4 - 10.8 x10E3/uL 5.0 5.4 3.8  Hemoglobin 11.1 - 15.9 g/dL 13.7 13.5 12.4  Hematocrit 34.0 - 46.6 % 41.3 41.2 37.1  Platelets 150 - 450 x10E3/uL 322 330 261    BMP Latest Ref Rng & Units 02/06/2019 08/08/2018 03/28/2018  Glucose 65 - 99 mg/dL 94 88 90  BUN 8 - 27 mg/dL 18 20 14   Creatinine 0.57 - 1.00 mg/dL 0.81 0.69 0.78  BUN/Creat Ratio 12 - 28 22 29(H) 18  Sodium 134 - 144 mmol/L 143 141 144  Potassium 3.5 - 5.2 mmol/L 4.8 4.5 4.4  Chloride 96 - 106 mmol/L 107(H) 103 101  CO2 20 - 29 mmol/L 25 26 26   Calcium 8.7 - 10.3 mg/dL 9.4 9.1 9.1    BNP No results found for: BNP  ProBNP No results found  for: PROBNP  PFT No results found for: FEV1PRE, FEV1POST, FVCPRE, FVCPOST, TLC, DLCOUNC, PREFEV1FVCRT, PSTFEV1FVCRT  No results found.   Past medical hx Past Medical History:  Diagnosis Date  . Arthralgia 09/11/2011  . Asthma   . Breast cancer ([place])   . 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 Hyperlipidemia   . Hypertension   . Insomnia   . Thyroid disease    hypothyroidism     Social History   Tobacco Use  . Smoking status: Former Smoker    Packs/day: 1.00    Years: 15.00    Pack years: 15.00    Types: Cigarettes    Quit date: 12/09/1978    Years since quitting: 40.5  . Smokeless tobacco: Former Systems developer    Quit date: 09/04/1978  Substance Use Topics  . Alcohol use: Yes    Alcohol/week: 0.0 standard drinks    Comment: SOCIALLY wine  . Drug use: No    Ms.Loiselle reports that she quit smoking about 40 years ago. Her smoking use included cigarettes. She has a 15.00 pack-year smoking history. She quit smokeless tobacco use about 40 years ago. She reports current alcohol use. She reports that she does not use drugs.  Tobacco Cessation: Former smoker quit 1980 with a 15 pack year smoking history  Past surgical hx, Family hx, Social hx all reviewed.  Current Outpatient Medications on File Prior to Visit  Medication Sig  . cetirizine (ZYRTEC) 10 MG tablet Take 10 mg by mouth as needed.   . ezetimibe (ZETIA) 10 MG tablet TAKE 1/2 (ONE HALF) TABLET BY MOUTH AT BEDTIME  . levothyroxine (SYNTHROID) 88 MCG tablet TAKE 1 TABLET BY MOUTH EVERY OTHER DAY ALTERNATING WITH 100 MCG TABLETS  . levothyroxine (SYNTHROID, LEVOTHROID) 100 MCG tablet 1 tablet every other day alternating with 88 mcg tablet  . losartan (COZAAR) 50 MG tablet TAKE 1 TABLET BY MOUTH EVERY DAY  . montelukast (SINGULAIR) 10 MG tablet TAKE 1 TABLET BY MOUTH EVERY DAY FOR ALLERGIES AND ASTHMA  . simvastatin (ZOCOR) 5 MG tablet TAKE 1 TABLET BY MOUTH EVERYDAY AT BEDTIME  . triamcinolone cream (KENALOG) 0.1  % Apply 1 application topically 2 (two) times daily. RF per Derm-Dr [place] Crane  . venlafaxine XR (EFFEXOR-XR) 75 MG 24 hr capsule Take 1 capsule (75 mg total) by mouth at bedtime.  . Vitamin D, Ergocalciferol, (DRISDOL) 50000 units CAPS capsule TAKE 1 CAPSULE BY MOUTH ONCE WEEKLY   No current facility-administered medications on file prior to visit.      Allergies  Allergen Reactions  . Advair Diskus [Fluticasone-Salmeterol]     Hoarseness.  . Neosporin [Neomycin-Polymyxin-Gramicidin] Itching    Review Of Systems:  Constitutional:   No  weight loss, night sweats,  Fevers, chills, fatigue, or  lassitude.  HEENT:   No headaches,  Difficulty swallowing,  Tooth/dental problems, or  Sore throat,                No sneezing, itching, ear ache, nasal congestion, + post nasal drip,   CV:  No chest pain,  Orthopnea, PND, swelling in lower extremities, anasarca, dizziness, palpitations, syncope.   GI  No heartburn, indigestion, abdominal pain, nausea, vomiting, diarrhea, change in bowel habits, loss of appetite, bloody stools.   Resp: No shortness of breath with exertion or at rest.  No excess mucus, no productive cough,  + non-productive cough,  No coughing up of blood.  No change in color of mucus.  No wheezing.  No chest wall deformity  Skin: no rash or lesions.  GU: no dysuria, change in color of urine, no urgency or frequency.  No flank pain, no hematuria   MS:  No joint pain or swelling.  No decreased range of motion.  No back pain.  Psych:  No change in mood or affect. No depression or anxiety.  No memory loss.   Vital Signs BP 138/70 (BP Location: Left Arm, Cuff Size: Normal)   Pulse 91   Temp (!) 97.2 F (36.2 C) (Oral)   Ht 5\' 7"  (1.702 m)   Wt 148 lb 6.4 oz (67.3 kg)   SpO2 94%   BMI 23.24 kg/m    Physical Exam:  General- No distress,  A&Ox3, pleasant ENT: No sinus tenderness, TM clear, pale nasal mucosa, no oral exudate,+ post nasal drip, no LAN, No JVD Cardiac: S1, S2,  regular rate and rhythm, no murmur Chest: No wheeze/ rales/ dullness; no accessory muscle use, no nasal flaring, no sternal retractions Abd.: Soft Non-tender, ND BS + Ext: No clubbing cyanosis, edema Neuro:  normal strength, MAE x 4, A&O x 3 Skin: No rashes, No lesions  warm and dry Psych: normal mood and behavior   Assessment/Plan  Cough variant  asthma Mild Flare Has not been using Q Var x 2 years Has not been using her Nexium Plan We will change your Zyrtec to Xyzol once daily Re-start you Nexium 20 mg daily Add 20 mg  pepcid if needed  Continue using your Singulair, and Flonase as you have been doing Continue Q var daily One puff twice daily Rinse after use We will send in a prescription for Q var  Use this October through March as you have in the past.  Sips of water instead of throat clearing Throat soothing with sugar free jolly ranchers, lemon heads or werthers. Avoid mint, menthol or chocolate. GERD diet Follow up in 3 months with Judson Roch NP or Dr. Loletta Specter  Please contact office for sooner follow up if symptoms do not improve or worsen or seek emergency care    GERD (gastroesophageal reflux disease) Has not been using PPI Plan Re-start you Nexium 20 mg daily Add 20 mg  pepcid if needed  GERD diet Follow up in 3 months with Judson Roch NP or Dr. Loletta Specter  Please contact office for sooner follow up if symptoms do not improve or worsen or seek emergency care    Allergic rhinitis Mild flare Plan We will change your Zyrtec to Xyzol once daily Continue using your Singulair, and Flonase as you have been doing Saline rinses as needed Follow up in 3 months with Judson Roch NP or Dr. Loletta Specter  Please contact office for sooner follow up if symptoms do not improve or worsen or seek emergency care    This appointment was 30 min long with over 50% of the time in direct face-to-face patient care, assessment, plan of care, and follow-up.   Magdalen Spatz, NP 06/09/2019  2:46 PM

## 2019-06-09 NOTE — Assessment & Plan Note (Signed)
Mild Flare Has not been using Q Var x 2 years Has not been using her Nexium Plan We will change your Zyrtec to Xyzol once daily Re-start you Nexium 20 mg daily Add 20 mg  pepcid if needed  Continue using your Singulair, and Flonase as you have been doing Continue Q var daily One puff twice daily Rinse after use We will send in a prescription for Q var  Use this October through March as you have in the past.  Sips of water instead of throat clearing Throat soothing with sugar free jolly ranchers, lemon heads or werthers. Avoid mint, menthol or chocolate. GERD diet Follow up in 3 months with Judson Roch NP or Dr. Loletta Specter  Please contact office for sooner follow up if symptoms do not improve or worsen or seek emergency care

## 2019-06-09 NOTE — Progress Notes (Signed)
Reviewed, agree 

## 2019-06-09 NOTE — Patient Instructions (Addendum)
It is good to see you today. We will change your Zyrtec to Xyzol once daily Re-start you Nexium 20 mg daily Add 20 mg  pepcid if needed  Continue using your Singulair, and Flonase as you have been doing Continue Q var daily One puff twice daily Rinse after use We will send in a prescription for Q var  Use this October through March as you have in the past.  Sips of water instead of throat clearing Throat soothing with sugar free jolly ranchers, lemon heads or werthers. Avoid mint, menthol or chocolate. GERD diet Follow up in 3 months with Judson Roch NP or Dr. Loletta Specter  Please contact office for sooner follow up if symptoms do not improve or worsen or seek emergency care

## 2019-07-10 ENCOUNTER — Other Ambulatory Visit: Payer: Self-pay | Admitting: Family Medicine

## 2019-07-10 DIAGNOSIS — E559 Vitamin D deficiency, unspecified: Secondary | ICD-10-CM

## 2019-07-10 MED ORDER — VITAMIN D (ERGOCALCIFEROL) 1.25 MG (50000 UNIT) PO CAPS: ORAL_CAPSULE | ORAL | 0 refills | Status: DC

## 2019-07-10 NOTE — Telephone Encounter (Signed)
Refill request received via fax. AS, CMA 

## 2019-07-15 ENCOUNTER — Other Ambulatory Visit: Payer: Self-pay | Admitting: Family Medicine

## 2019-07-15 DIAGNOSIS — I10 Essential (primary) hypertension: Secondary | ICD-10-CM

## 2019-08-13 ENCOUNTER — Other Ambulatory Visit: Payer: PPO

## 2019-08-13 ENCOUNTER — Other Ambulatory Visit: Payer: Self-pay

## 2019-08-13 DIAGNOSIS — E786 Lipoprotein deficiency: Secondary | ICD-10-CM

## 2019-08-13 DIAGNOSIS — E039 Hypothyroidism, unspecified: Secondary | ICD-10-CM | POA: Diagnosis not present

## 2019-08-13 DIAGNOSIS — M25511 Pain in right shoulder: Secondary | ICD-10-CM | POA: Diagnosis not present

## 2019-08-13 DIAGNOSIS — G8929 Other chronic pain: Secondary | ICD-10-CM

## 2019-08-13 DIAGNOSIS — E782 Mixed hyperlipidemia: Secondary | ICD-10-CM | POA: Diagnosis not present

## 2019-08-13 DIAGNOSIS — M25551 Pain in right hip: Secondary | ICD-10-CM | POA: Diagnosis not present

## 2019-08-13 DIAGNOSIS — R7303 Prediabetes: Secondary | ICD-10-CM | POA: Diagnosis not present

## 2019-08-13 DIAGNOSIS — E559 Vitamin D deficiency, unspecified: Secondary | ICD-10-CM | POA: Diagnosis not present

## 2019-08-13 DIAGNOSIS — I10 Essential (primary) hypertension: Secondary | ICD-10-CM

## 2019-08-14 LAB — LIPID PANEL
Chol/HDL Ratio: 3.3 ratio (ref 0.0–4.4)
Cholesterol, Total: 135 mg/dL (ref 100–199)
HDL: 41 mg/dL (ref >39)
LDL Chol Calc (NIH): 82 mg/dL (ref 0–99)
Triglycerides: 53 mg/dL (ref 0–149)
VLDL Cholesterol Cal: 12 mg/dL (ref 5–40)

## 2019-08-14 LAB — HEMOGLOBIN A1C
Est. average glucose Bld gHb Est-mCnc: 114 mg/dL
Hgb A1c MFr Bld: 5.6 % (ref 4.8–5.6)

## 2019-08-14 LAB — TSH: TSH: 2.96 u[IU]/mL (ref 0.450–4.500)

## 2019-08-14 LAB — T4, FREE: Free T4: 1.3 ng/dL (ref 0.82–1.77)

## 2019-08-14 LAB — T3, FREE: T3, Free: 2.4 pg/mL (ref 2.0–4.4)

## 2019-08-19 ENCOUNTER — Other Ambulatory Visit: Payer: Self-pay | Admitting: Family Medicine

## 2019-08-19 DIAGNOSIS — E782 Mixed hyperlipidemia: Secondary | ICD-10-CM

## 2019-08-19 DIAGNOSIS — E786 Lipoprotein deficiency: Secondary | ICD-10-CM

## 2019-08-21 ENCOUNTER — Encounter: Payer: Self-pay | Admitting: Family Medicine

## 2019-08-21 ENCOUNTER — Ambulatory Visit (INDEPENDENT_AMBULATORY_CARE_PROVIDER_SITE_OTHER): Payer: PPO | Admitting: Family Medicine

## 2019-08-21 ENCOUNTER — Other Ambulatory Visit: Payer: Self-pay

## 2019-08-21 VITALS — BP 121/70 | HR 98 | Ht 67.0 in | Wt 146.0 lb

## 2019-08-21 DIAGNOSIS — Z7189 Other specified counseling: Secondary | ICD-10-CM

## 2019-08-21 DIAGNOSIS — E559 Vitamin D deficiency, unspecified: Secondary | ICD-10-CM

## 2019-08-21 DIAGNOSIS — E8881 Metabolic syndrome: Secondary | ICD-10-CM | POA: Diagnosis not present

## 2019-08-21 DIAGNOSIS — E782 Mixed hyperlipidemia: Secondary | ICD-10-CM

## 2019-08-21 DIAGNOSIS — E786 Lipoprotein deficiency: Secondary | ICD-10-CM | POA: Diagnosis not present

## 2019-08-21 DIAGNOSIS — K635 Polyp of colon: Secondary | ICD-10-CM | POA: Diagnosis not present

## 2019-08-21 DIAGNOSIS — E039 Hypothyroidism, unspecified: Secondary | ICD-10-CM | POA: Diagnosis not present

## 2019-08-21 DIAGNOSIS — J3089 Other allergic rhinitis: Secondary | ICD-10-CM

## 2019-08-21 DIAGNOSIS — F4320 Adjustment disorder, unspecified: Secondary | ICD-10-CM

## 2019-08-21 DIAGNOSIS — I10 Essential (primary) hypertension: Secondary | ICD-10-CM | POA: Diagnosis not present

## 2019-08-21 MED ORDER — LEVOTHYROXINE SODIUM 88 MCG PO TABS: ORAL_TABLET | ORAL | 1 refills | Status: DC

## 2019-08-21 MED ORDER — OMEGA-3-ACID ETHYL ESTERS 1 G PO CAPS: 2.0000 g | ORAL_CAPSULE | Freq: Two times a day (BID) | ORAL | 1 refills | Status: DC

## 2019-08-21 MED ORDER — ATORVASTATIN CALCIUM 20 MG PO TABS: 20.0000 mg | ORAL_TABLET | Freq: Every day | ORAL | 1 refills | Status: DC

## 2019-08-21 MED ORDER — VITAMIN D (ERGOCALCIFEROL) 1.25 MG (50000 UNIT) PO CAPS: ORAL_CAPSULE | ORAL | 1 refills | Status: DC

## 2019-08-21 MED ORDER — LOSARTAN POTASSIUM 50 MG PO TABS: 50.0000 mg | ORAL_TABLET | Freq: Every day | ORAL | 1 refills | Status: DC

## 2019-08-21 MED ORDER — LEVOTHYROXINE SODIUM 100 MCG PO TABS: ORAL_TABLET | ORAL | 1 refills | Status: DC

## 2019-08-21 MED ORDER — MONTELUKAST SODIUM 10 MG PO TABS: ORAL_TABLET | ORAL | 1 refills | Status: DC

## 2019-08-21 NOTE — Progress Notes (Signed)
Telehealth office visit note for Lindsey Price, D.O- at Primary Care at [facility]   I connected with current patient today and verified that I am speaking with the correct person using two identifiers.   . Location of the patient: Home . Location of the provider: Office Only the patient (+/- their family members at pt's discretion) and myself were participating in the encounter - This visit type was conducted due to national recommendations for restrictions regarding the COVID-19 Pandemic (e.g. social distancing) in an effort to limit this patient's exposure and mitigate transmission in our community.  This format is felt to be most appropriate for this patient at this time.   - The patient did not have access to video technology or had technical difficulties with video requiring transitioning to audio format only. - No physical exam could be performed with this format, beyond that communicated to [place] by the patient/ family members as noted.   - Additionally my office staff/ schedulers discussed with the patient that there may be a monetary charge related to this service, depending on their medical insurance.   The patient expressed understanding, and agreed to proceed.       History of Present Illness: Hypertension, Hyperlipidemia, and Hypothyroidism   I, Lindsey Price, am serving as scribe for Dr. [name].  - Husband with Recent COVID-19 Denies new exposures to COVID-19 and states they've been doing good "since Lindsey Price had it, yeah."  Says "he was so sick; I have never seen someone be so sick."  She thought that when he checked into the hospital for acute care, it would be the last time she saw him.  Says he just "shook all the time; he couldn't sit up, he couldn't eat, but had no coughing or congestion."  Notes he "never had a fever over 100, but he was freezing all the time."  Says on the Tuesday night when he turned the corner, "his whole bed was soaked through the  mattress pad."  Says "It's a miracle to be 73 years old and have survived that, but he did."  Her husband plays golf and works out every day, and notes "he was dragging" when he first came down with COVID-19.  He had felt terrible for 3-4 days before being declared positive.  Says "people that are not afraid of COVID need to talk to me."  - Seasonal & Environmental Allergies Notes "after Lindsey Price had COVID, I had a cough."  Because of this, she went back and got tested again to make sure she didn't have COVID-19.   At this time, she went back to the pulmonologist; saw the PA.  Notes she was placed on Lindsey Price, but "I don't like it because it makes me hoarse, so I haven't been taking it."  She was additionally placed on Lindsey Price and taken off of Lindsey Price, and continues taking Lindsey Price.  "I hadn't had any problems."  - Lifestyle Despite gyms being closed, she continues to exercise.  She uses an elliptical and bike at home, and she walks.  Notes she has muscle aches all the time because she works out so much.  "Everything causes something to hurt."  Notes she's never noticed pain from a drug.  - Acquired Hypothyroidism Continues management as established, synthroid one day, levothyroxine the other.  - Adjustment Disorder Lindsey Price historically prescribed by NP Lindsey Price of Hematology & Oncology.  Patient feels her mood is fine.  She has been managed on Lindsey Price  since she had breast cancer, in 2011.  Says "I'm fine until I decide I don't need it."  Notes during treatment for breast cancer, she had to switch from Lindsey Price to Lindsey Price.  Patient states while on Lindsey Price, she could not take Lindsey Price, and was managed on Lindsey Price.  - History of Elevated Liver Enzymes Notes Lindsey Price strongly affected her liver, as did a dermatological treatment she used in the past.  She fears taking any medications that may affect her liver.  - Colonoscopy, Last Obtained May 2015, with 5 year repeat Notes she is concerned  about whether she needed another colonoscopy sooner than indicated.  Notes benign polyp(s) were found during her last colonoscopy.  Says she asked Lindsey Price in [place] if she needed a repeat sooner than five years. She is currently due for repeat, in 2020.  HPI:  Hypertension:  -  Her blood pressure at home has been running: notes it's been running 88/50, 104/56, but without any side effects.  She took her machine to be checked, because she felt it was measuring her BP low, and while her measurement was 122/70 in office, it was 115/65 at home.  Notes recent measurement of 120/59; this was taken with a friend's machine "to make sure her machine was close."  "I don't know why it's low at home when I take it, but if I go to give blood or go to the doctor's, nobody seems concerned."  - Patient reports good compliance with medication and/or lifestyle modification  - Her denies acute concerns or problems related to treatment plan  - She denies new onset of: chest pain, exercise intolerance, shortness of breath, dizziness, visual changes, headache, lower extremity swelling or claudication.   Last 3 blood pressure readings in our office are as follows: BP Readings from Last 3 Encounters:  08/21/19 121/70  06/09/19 138/70  06/03/19 120/64   Filed Weights   08/21/19 1437  Weight: 146 lb (66.2 kg)   HPI:  Hyperlipidemia:  73 y.o. female here for cholesterol follow-up.   - Patient reports good compliance with treatment plan of:  medication and/ or lifestyle management.    Continues Lindsey Price and Lindsey Price as established.  Says she was told that Lindsey Price is not processed in the liver and that I was a "better drug; whoever gave me that prescription, that was their reasoning."  - Patient denies any acute concerns or problems with management plan   - She denies new onset of: myalgias, arthralgias, increased fatigue more than normal, chest pains, exercise intolerance, shortness of breath, dizziness,  visual changes, headache, lower extremity swelling or claudication.   Most recent cholesterol panel was:  Lab Results  Component Value Date   CHOL 135 08/13/2019   HDL 41 08/13/2019   LDLCALC 82 08/13/2019   TRIG 53 08/13/2019   CHOLHDL 3.3 08/13/2019   Hepatic Function Latest Ref Rng & Units 02/06/2019 03/28/2018 12/13/2017  Total Protein 6.0 - 8.5 g/dL 6.5 6.2 -  Albumin 3.7 - 4.7 g/dL 4.1 3.7 -  AST 0 - 40 IU/L 19 17 -  ALT 0 - 32 IU/L '21 17 20  '$ Alk Phosphatase 39 - 117 IU/L 101 93 -  Total Bilirubin 0.0 - 1.2 mg/dL 0.4 0.4 -         GAD 7 : Generalized Anxiety Score 04/09/2019 02/12/2019  Nervous, Anxious, on Edge 0 0  Control/stop worrying 0 0  Worry too much - different things 0 0  Trouble relaxing 0 0  Restless 0 1  Easily annoyed or irritable 0 0  Afraid - awful might happen 0 0  Total GAD 7 Score 0 1  Anxiety Difficulty - Not difficult at all    Depression screen [facility] 2/9 08/21/2019 04/09/2019 02/12/2019 09/30/2018 08/08/2018  Decreased Interest 0 0 0 0 0  Down, Depressed, Hopeless 0 0 0 0 0  PHQ - 2 Score 0 0 0 0 0  Altered sleeping 0 0 1 0 0  Tired, decreased energy 0 0 0 0 0  Change in appetite 0 0 0 0 0  Feeling bad or failure about yourself  0 0 0 0 0  Trouble concentrating 0 0 0 0 0  Moving slowly or fidgety/restless 0 0 0 0 0  Suicidal thoughts 0 0 0 0 0  PHQ-9 Score 0 0 1 0 0  Difficult doing work/chores - Not difficult at all Not difficult at all Not difficult at all Not difficult at all  Some recent data might be hidden      Impression and Recommendations:    1. Essential hypertension   2. Hyperlipemia, mixed   3. Low HDL (under 40)   4. Insulin resistance/ pre-DM   5. Acquired hypothyroidism   6. Adjustment disorder, unspecified type   7. Vitamin D deficiency   8. Environmental and seasonal allergies   9. Benign polyp of large intestine   10. Educated about COVID-19 virus infection      Husband with COVID-19 Recently - On 04/16/2019 patient  obtained negative COVID test through [facility] .  - Novel Covid -19 counseling done; all questions were answered.   - Current CDC / federal and [place] guidelines reviewed with patient  - Reminded pt of extreme importance of social distancing; wearing a mask when out in public; insensate handwashing and cleaning of surfaces, avoiding unnecessary trips for shopping and avoiding ALL but emergency appts etc. - Told patient to be prepared, not scared; and be smart for the sake of others - Patient will call with any additional concerns  - Reviewed recent lab work (08/13/2019) in depth with patient today.  All lab work within normal limits unless otherwise noted.  Extensive education provided and all questions answered.  Adjustment Disorder - Stable.  Managed long-term on Lindsey Price. - Continue treatment plan as established. - Will continue to monitor.  Acquired Hypothyroidism - Per pt, takes synthroid one day, and levothyroxine the other. - All labs, TSH, T4, T3, WNL last check. - Will continue to monitor and re-check as discussed.  Vitamin D Deficiency - Last obtained six months ago, measured at 66.8. - Continue supplementation as prescribed. - Will continue to monitor and re-check as recommended.  Environmental & Seasonal Allergies - Stable on current management. - Treatment plan managed by pulmonology. - Patient will continue to follow up with specialist as established. - Continue management as established.  See med list. - Will continue to monitor alongside pulm.  Hyperlipidemia, Mixed, Low HDL Last FLP obtained 8 days ago: Triglycerides = 53, down from 88 prior LDL = up to 82 from 71 in June, and 89 prior. HDL = 41, down from 43 prior.  - Cholesterol levels are currently at goal.  - Discussed adjusting treatment plan today, to help lower LDL and increase HDL. - Education provided regarding risks, benefits, and alternatives to current management. - Patient agrees to medication  changes today.   - Lindsey Price & Zocor discontinued. - Begin Lipitor 20 mg.  See med list.  - Prescription fish oil provided  today.  See med list. - Advised patient to take at least 2 grams of fish oil in the morning, 2 grams in the evening.  - Prudent dietary changes such as low saturated & trans fat diets for hyperlipidemia and low carb diets for hypertriglyceridemia discussed with patient.    - Encouraged patient to follow AHA guidelines for regular exercise and also engage in weight loss if BMI above 25.   - Educational handouts provided at patient's desire and/ or told to look online at the [place] website for further information.  - We will continue to monitor.  Essential Hypertension - Discussed white coat syndrome with patient today.  - Reviewed goal BP.  Explained that as long as BP is not dropping too low and causing symptoms, such as dizziness when standing, patient's blood pressure is considered WNL.  - Blood pressure currently is well-controlled. - Patient will continue current treatment regimen.  See med list. - Patient knows to monitor for symptoms of concern such as dizziness or lightheadedness.  - Counseled patient on pathophysiology of disease and discussed various treatment options, which always includes dietary and lifestyle modification as first line.   - Lifestyle changes such as dash and heart healthy diets and engaging in a regular exercise program discussed extensively with patient.   - Ambulatory blood pressure monitoring encouraged at least 3 times weekly.  Keep log and bring in every office visit.  Reminded patient that if they ever feel poorly in any way, to check their blood pressure and pulse.  - We will continue to monitor  Insulin Resistance - On 02/06/2019, A1c was elevated to 5.9. - A1c 5.6 last check, WNL. - Will continue to monitor and re-check as discussed, every 6-12 months.  Need for Repeat Colonoscopy - History of Benign  Polyp - Colonoscopy last obtained May of 2015.  Need for repeat colonoscopy.  - Referral placed to Dr. Silverio Decamp today for colonoscopy screening.  See orders.  - Extensive education provided to patient today.  All questions answered.  - Patient knows to call front desk if she has not heard about her referral in 1-2 weeks.  Recommendations - Return in 3-4 months to re-check FLP, CMP after starting new statin, ov 1 week later.   Patient was interviewed and evaluated by me/ [facility] staff members in the clinic today for 40+ minutes, with over 50% of my time spent in face to face counseling of patients various medical conditions, treatment plans of those medical conditions including medicine management and lifestyle modification, strategies to improve health and well being; and in coordination of care.   SEE TREATMENT PLAN FOR DETAILS   - As part of my medical decision making, I reviewed the following data within the [place] History obtained from pt /family, CMA notes reviewed and incorporated if applicable, Labs reviewed, Radiograph/ tests reviewed if applicable and OV notes from prior OV's with me, as well as other specialists she/he has seen since seeing me last, were all reviewed and used in my medical decision making process today.    - Additionally, discussion had with patient regarding our treatment plan, and their biases/concerns about that plan were used in my medical decision making today.    - The patient agreed with the plan and demonstrated an understanding of the instructions.   No barriers to understanding were identified.    - Red flag symptoms and signs discussed in detail.  Patient expressed understanding regarding what to do in case  of emergency\ urgent symptoms.   - The patient was advised to call back or seek an in-person evaluation if the symptoms worsen or if the condition fails to improve as anticipated.   Return for re-check FLP, CMP 3-4 months after  starting 20 mg Lipitor, OV 1 week later.    Orders Placed This Encounter  Procedures  . Lipid panel  . Comprehensive metabolic panel  . Ambulatory referral to Gastroenterology    Meds ordered this encounter  Medications  . levothyroxine (SYNTHROID) 88 MCG tablet    Sig: *    Dispense:  45 tablet    Refill:  1  . levothyroxine (SYNTHROID) 100 MCG tablet    Sig: 1 tablet every other day alternating with 88 mcg tablet    Dispense:  45 tablet    Refill:  1  . losartan (COZAAR) 50 MG tablet    Sig: Take 1 tablet (50 mg total) by mouth daily.    Dispense:  90 tablet    Refill:  1  . montelukast (Lindsey Price) 10 MG tablet    Sig: *    Dispense:  90 tablet    Refill:  1  . Vitamin D, Ergocalciferol, (DRISDOL) 1.25 MG (50000 UT) CAPS capsule    Sig: TAKE 1 CAPSULE BY MOUTH ONCE WEEKLY    Dispense:  12 capsule    Refill:  1  . atorvastatin (LIPITOR) 20 MG tablet    Sig: Take 1 tablet (20 mg total) by mouth at bedtime.    Dispense:  90 tablet    Refill:  1  . omega-3 acid ethyl esters (LOVAZA) 1 g capsule    Sig: Take 2 capsules (2 g total) by mouth 2 (two) times daily.    Dispense:  180 capsule    Refill:  1    Medications Discontinued During This Encounter  Medication Reason  . cetirizine (Lindsey Price) 10 MG tablet Error  . ezetimibe (Lindsey Price) 10 MG tablet   . Lindsey Price (ZOCOR) 5 MG tablet   . levothyroxine (SYNTHROID, LEVOTHROID) 100 MCG tablet Reorder  . levothyroxine (SYNTHROID) 88 MCG tablet Reorder  . Vitamin D, Ergocalciferol, (DRISDOL) 1.25 MG (50000 UT) CAPS capsule Reorder  . losartan (COZAAR) 50 MG tablet Reorder  . montelukast (Lindsey Price) 10 MG tablet Reorder     I provided 30+ minutes of non face-to-face time during this encounter.  Additional time was spent with charting and coordination of care before and after the actual visit commenced.   Note:  This note was prepared with assistance of Dragon voice recognition software. Occasional wrong-word or sound-a-like  substitutions may have occurred due to the inherent limitations of voice recognition software.  This document serves as a record of services personally performed by Lindsey Dance, DO. It was created on her behalf by Lindsey Price, a trained medical scribe. The creation of this record is based on the scribe's personal observations and the provider's statements to them.   This case required medical decision making of at least moderate complexity. The above documentation has been reviewed to be accurate and was completed by Marjory Sneddon, D.O.     Patient Care Team    Relationship Specialty Notifications Start End  Lindsey Dance, DO PCP - General Family Medicine  03/12/17   Juanito Doom, MD Consulting Physician Pulmonary Disease  03/12/17   Elsie Saas, MD Consulting Physician Orthopedic Surgery  03/12/17    Comment: L rot cuff repair  Allyn Kenner, MD Consulting Physician Dermatology  03/12/17  Comment: sister with sebacious cell CA  Katy Apo, MD Consulting Physician Ophthalmology  03/12/17   Jovita Kussmaul, MD Consulting Physician General Surgery  03/12/17   Nicholas Lose, MD Consulting Physician Hematology and Oncology  03/12/17   Garlan Fair, MD Consulting Physician Gastroenterology  04/12/17   Gardenia Phlegm, NP Nurse Practitioner Hematology and Oncology  08/21/19      -Vitals obtained; medications/ allergies reconciled;  personal medical, social, Sx etc.histories were updated by CMA, reviewed by me and are reflected in chart   Patient Active Problem List   Diagnosis Date Noted  . Insulin resistance/ pre-DM 08/21/2019  . Low HDL (under 40) 04/04/2018  . HTN (hypertension) 03/12/2017  . Hyperlipemia, mixed 03/12/2017  . Hypothyroidism 03/12/2017  . Adjustment disorder 03/12/2017  . GERD (gastroesophageal reflux disease) 03/12/2017  . Cough variant asthma 02/23/2015  . Cancer of right breast, stage 0 09/11/2011  . Vitamin D deficiency 04/12/2017   . Osteopenia 04/12/2017  . Environmental and seasonal allergies 03/12/2017  . Family history of multiple cancers 03/12/2017  . History of tobacco abuse 03/12/2017  . Insomnia 09/11/2011  . Benign polyp of large intestine 08/21/2019  . Educated about COVID-19 virus infection 04/16/2019  . Chronic left hip pain 04/16/2019  . Piriformis muscle pain 04/16/2019  . Contact dermatitis 09/30/2018  . Traumatic complete tear of right rotator cuff 09/30/2018  . Chronic right shoulder pain 09/30/2018  . Muscle cramp, nocturnal 08/08/2018  . Reactive airway disease 12/17/2017  . Cataracts, bilateral- s/p surgery. 03/12/2017  . Allergic rhinitis 03/23/2015  . Arthralgia 09/11/2011     Current Meds  Medication Sig  . esomeprazole (NEXIUM) 20 MG capsule Take 20 mg by mouth daily at 12 noon.  Marland Kitchen levocetirizine (Lindsey Price) 5 MG tablet Take 5 mg by mouth every evening.  Marland Kitchen levothyroxine (SYNTHROID) 100 MCG tablet 1 tablet every other day alternating with 88 mcg tablet  . levothyroxine (SYNTHROID) 88 MCG tablet *  . losartan (COZAAR) 50 MG tablet Take 1 tablet (50 mg total) by mouth daily.  . montelukast (Lindsey Price) 10 MG tablet *  . triamcinolone cream (KENALOG) 0.1 % Apply 1 application topically 2 (two) times daily. RF per Derm-Dr [place] Crane  . venlafaxine XR (Lindsey Price-XR) 75 MG 24 hr capsule Take 1 capsule (75 mg total) by mouth at bedtime.  . Vitamin D, Ergocalciferol, (DRISDOL) 1.25 MG (50000 UT) CAPS capsule TAKE 1 CAPSULE BY MOUTH ONCE WEEKLY  . [DISCONTINUED] ezetimibe (Lindsey Price) 10 MG tablet TAKE 1/2 (ONE HALF) TABLET BY MOUTH AT BEDTIME  . [DISCONTINUED] levothyroxine (SYNTHROID) 88 MCG tablet TAKE 1 TABLET BY MOUTH EVERY OTHER DAY ALTERNATING WITH 100 MCG TABLETS  . [DISCONTINUED] levothyroxine (SYNTHROID, LEVOTHROID) 100 MCG tablet 1 tablet every other day alternating with 88 mcg tablet  . [DISCONTINUED] losartan (COZAAR) 50 MG tablet TAKE 1 TABLET BY MOUTH EVERY DAY  . [DISCONTINUED] montelukast  (Lindsey Price) 10 MG tablet TAKE 1 TABLET BY MOUTH EVERY DAY FOR ALLERGIES AND ASTHMA  . [DISCONTINUED] Lindsey Price (ZOCOR) 5 MG tablet TAKE 1 TABLET BY MOUTH EVERYDAY AT BEDTIME  . [DISCONTINUED] Vitamin D, Ergocalciferol, (DRISDOL) 1.25 MG (50000 UT) CAPS capsule TAKE 1 CAPSULE BY MOUTH ONCE WEEKLY     Allergies:  Allergies  Allergen Reactions  . Advair Diskus [Fluticasone-Salmeterol]     Hoarseness.  . Neosporin [Neomycin-Polymyxin-Gramicidin] Itching     ROS:  See above HPI for pertinent positives and negatives   Objective:   Blood pressure 121/70, pulse 98, height '5\' 7"'$  (1.702 m),  weight 146 lb (66.2 kg).  (if some vitals are omitted, this means that patient was UNABLE to obtain them even though they were asked to get them prior to OV today.  They were asked to call us at their earliest convenience with these once obtained. )  General: A & O * 3; sounds in no acute distress; in usual state of health.  Skin: Pt confirms warm and dry extremities and pink fingertips HEENT: Pt confirms lips non-cyanotic Chest: Patient confirms normal chest excursion and movement Respiratory: speaking in full sentences, no conversational dyspnea; patient confirms no use of accessory muscles Psych: insight appears good, mood- appears full

## 2019-09-09 ENCOUNTER — Ambulatory Visit (INDEPENDENT_AMBULATORY_CARE_PROVIDER_SITE_OTHER): Payer: PPO | Admitting: Family Medicine

## 2019-09-09 ENCOUNTER — Encounter: Payer: Self-pay | Admitting: Family Medicine

## 2019-09-09 ENCOUNTER — Other Ambulatory Visit: Payer: Self-pay

## 2019-09-09 VITALS — BP 126/75 | HR 85 | Temp 97.7°F | Resp 10 | Ht 67.0 in | Wt 149.2 lb

## 2019-09-09 DIAGNOSIS — R109 Unspecified abdominal pain: Secondary | ICD-10-CM | POA: Diagnosis not present

## 2019-09-09 DIAGNOSIS — M545 Low back pain, unspecified: Secondary | ICD-10-CM

## 2019-09-09 DIAGNOSIS — R1011 Right upper quadrant pain: Secondary | ICD-10-CM

## 2019-09-09 DIAGNOSIS — M62838 Other muscle spasm: Secondary | ICD-10-CM

## 2019-09-09 LAB — POCT URINALYSIS DIPSTICK
Bilirubin, UA: NEGATIVE
Blood, UA: NEGATIVE
Glucose, UA: NEGATIVE
Ketones, UA: NEGATIVE
Nitrite, UA: NEGATIVE
Protein, UA: NEGATIVE
Spec Grav, UA: 1.025 (ref 1.010–1.025)
Urobilinogen, UA: 0.2 E.U./dL
pH, UA: 5.5 (ref 5.0–8.0)

## 2019-09-09 MED ORDER — CYCLOBENZAPRINE HCL 10 MG PO TABS: 10.0000 mg | ORAL_TABLET | Freq: Three times a day (TID) | ORAL | 1 refills | Status: DC | PRN

## 2019-09-09 MED ORDER — IBUPROFEN 600 MG PO TABS: 600.0000 mg | ORAL_TABLET | Freq: Three times a day (TID) | ORAL | 0 refills | Status: DC | PRN

## 2019-09-09 NOTE — Patient Instructions (Signed)
Muscle Cramps and Spasms °Muscle cramps and spasms occur when a muscle or muscles tighten and you have no control over this tightening (involuntary muscle contraction). They are a common problem and can develop in any muscle. The most common place is in the calf muscles of the leg. Muscle cramps and muscle spasms are both involuntary muscle contractions, but there are some differences between the two: °· Muscle cramps are painful. They come and go and may last for a few seconds or up to 15 minutes. Muscle cramps are often more forceful and last longer than muscle spasms. °· Muscle spasms may or may not be painful. They may also last just a few seconds or much longer. °Certain medical conditions, such as diabetes or Parkinson's disease, can make it more likely to develop cramps or spasms. However, cramps or spasms are usually not caused by a serious underlying problem. Common causes include: °· Doing more physical work or exercise than your body is ready for (overexertion). °· Overuse from repeating certain movements too many times. °· Remaining in a certain position for a long period of time. °· Improper preparation, form, or technique while playing a sport or doing an activity. °· Dehydration. °· Injury. °· Side effects of some medicines. °· Abnormally low levels of the salts and minerals in your blood (electrolytes), especially potassium and calcium. This could happen if you are taking water pills (diuretics) or if you are pregnant. °In many cases, the cause of muscle cramps or spasms is not known. °Follow these instructions at home: °Managing pain and stiffness ° °  ° °· Try massaging, stretching, and relaxing the affected muscle. Do this for several minutes at a time. °· If directed, apply heat to tight or tense muscles as often as told by your health care provider. Use the heat source that your health care provider recommends, such as a moist heat pack or a heating pad. °? Place a towel between your skin and  the heat source. °? Leave the heat on for 20-30 minutes. °? Remove the heat if your skin turns bright red. This is especially important if you are unable to feel pain, heat, or cold. You may have a greater risk of getting burned. °· If directed, put ice on the affected area. This may help if you are sore or have pain after a cramp or spasm. °? Put ice in a plastic bag. °? Place a towel between your skin and the bag. °? Leave the ice on for 20 minutes, 2-3 times a day. °· Try taking hot showers or baths to help relax tight muscles. °Eating and drinking °· Drink enough fluid to keep your urine pale yellow. Staying well hydrated may help prevent cramps or spasms. °· Eat a healthy diet that includes plenty of nutrients to help your muscles function. A healthy diet includes fruits and vegetables, lean protein, whole grains, and low-fat or nonfat dairy products. °General instructions °· If you are having frequent cramps, avoid intense exercise for several days. °· Take over-the-counter and prescription medicines only as told by your health care provider. °· Pay attention to any changes in your symptoms. °· Keep all follow-up visits as told by your health care provider. This is important. °Contact a health care provider if: °· Your cramps or spasms get more severe or happen more often. °· Your cramps or spasms do not improve over time. °Summary °· Muscle cramps and spasms occur when a muscle or muscles tighten and you have no control over this   tightening (involuntary muscle contraction). °· The most common place for cramps or spasms to occur is in the calf muscles of the leg. °· Massaging, stretching, and relaxing the affected muscle may relieve the cramp or spasm. °· Drink enough fluid to keep your urine pale yellow. Staying well hydrated may help prevent cramps or spasms. °This information is not intended to replace advice given to you by your health care provider. Make sure you discuss any questions you have with your  health care provider. °Document Revised: 01/14/2018 Document Reviewed: 01/14/2018 °Elsevier Patient Education © 2020 [employer] ° °

## 2019-09-09 NOTE — Progress Notes (Signed)
Impression and Recommendations:    1. Acute bilateral low back pain without sciatica   2. Right upper quadrant abdominal pain   3. Muscle spasm- back and abdominal spasms     Acute B/L Low Back Pain w/out Sciatica, RUQ Abdominal Muscle Pain Muscle Spasm - Back and Abdominal Spasms - Reviewed patient's symptoms extensively today.  - Discussed onset of musculoskeletal pain and strain, including engaging the core muscles while folding laundry, cooking, standing for long periods of time, and otherwise engaging in more activity outside of the norm during the holidays.  - Muscle relaxers provided today to assist with pain and discomfort while sleeping.  See med list. - Risks, benefits, and alternatives to muscle relaxers discussed with patient today.  Advised patient to begin with one dose at night, as drowsiness may be a side-effect.  - Patient may continue ibuprofen/alleve for short-term relief.   - Prescription Advil provided today. - Discussed prudent use of OTC NSAID's.  - Discussed need to improve and maintain strength and physical conditioning to help prevent musculoskeletal strain in the future.  - Advised patient to stop poking, prodding, and mashing the area of pain; allow it to heal.  - Told patient to avoid activities that require use of the core muscles, such as pushing, pulling, repeated bending over, standing for long periods of times folding laundry, etc.  - Patient agrees to take it easy for two weeks. - Patient may engage in light physical activity such as gentle walking or cycling.  - If pain does not improve with conservative treatment and relaxation as discussed, next step will be to send patient to physical therapy.   Education and routine counseling performed. Handouts provided.   Return if symptoms worsen or fail to improve;  Also f/up per OV note on 08/21/19--->started lipitor, for pt will need re-check FLP, CMP, in 2.5 mo after starting lipitor, OV 1  wk later.   Gross side effects, risk and benefits, and alternatives of medications discussed with patient.  Patient is aware that all medications have potential side effects and we are unable to predict every sideeffect or drug-drug interaction that may occur.  Expresses verbal understanding and consents to current therapy plan and treatment regiment.  Please see AVS handed out to patient at the end of our visit for further patient instructions/ counseling done pertaining to today's office visit.   This document serves as a record of services personally performed by Mellody Dance, DO. It was created on her behalf by [name], a trained medical scribe. The creation of this record is based on the scribe's personal observations and the provider's statements to them.   This case required medical decision making of at least moderate complexity. The above documentation has been reviewed to be accurate and was completed by Marjory Sneddon, D.O.     --------------------------------------------------------------------------------------------------------------------------------------------------------------------------------------------------------------------------------------------  Lindsey Price, am serving as scribe for Dr. Mellody Dance.    Subjective:   CC: BACK PAIN  Location: Bilateral back pain, rad to RUQ abdominal pain. Reports right upper quadrant abdominal pain that became tender, only w certain mvmnts.   Quality:  Tender. Onset: Back pain onset two weeks ago, including the week before Christmas, especially Christmas Eve.  RUQ pain onset the Sunday after Christmas.  Worse with: Back pain is worse with certain mvmnts  Better with: Back pain is better with Advil. Radiation: Denies radiation. Trauma: Denies trauma.  Has been doing more activity, packing, cooking, etc. with Christmas  Best sitting/standing/leaning forward: neither  The week before Christmas,  she was standing folding clothes, and notes her back "was killing me"  Notes she has never had back pain before.  Her main incident of onset was on Christmas Eve, nearly two weeks ago.  Says it hurt in "both sides, and I could hardly straighten up."    Notes pain is located from the waist and below, bilaterally.  She took Advil and her back pain improved.  On the Sunday after Christmas, they went out of town to [place] and slept in another bed, and patient notes her back was killing her again at night.  She also began to develop abdominal pain.  She thinks the pain in her right upper quadrant is separate from the pain in her back.  Says she would wake up due to her back pain, then roll over and feel the abdominal pain.  Notes there is nothing she can do to improve her abdominal pain, however "it doesn't really bother me unless I touch it."  The RUQ pain is "always there when you touch it," but "it doesn't hurt when I'm just sitting here talking to you at all."  Doesn't hurt when she moves certain ways either.  Has never had this pain before.  Has her gallbladder, appendix, and all organs in her abdomen.  Denies fever, chills, nausea, vomiting, diarrhea; denies changes in bowels, denies intolerance to certain foods.  Denies difficulty breathing.  Denies worsening pain when taking deep breaths.  Thinks she may have had a kidney stone one time; notes current pain is not as bad.  With the kidney stone, reports the pain was located more in her back.  Confirms eating and drinking normally.   Red Flags Fecal/urinary incontinence: no  Numbness/Weakness: no  Fever/chills/sweats: no  Night pain: no  Unexplained weight loss: no  No relief with bedrest: no  h/o cancer/immunosuppression: no  IV drug use: no  PMH of osteoporosis or chronic steroid use: no    Patient Care Team    Relationship Specialty Notifications Start End  Mellody Dance, DO PCP - General Family Medicine  03/12/17   Juanito Doom, MD Consulting Physician Pulmonary Disease  03/12/17   Elsie Saas, MD Consulting Physician Orthopedic Surgery  03/12/17    Comment: L rot cuff repair  Allyn Kenner, MD Consulting Physician Dermatology  03/12/17    Comment: sister with sebacious cell CA  Katy Apo, MD Consulting Physician Ophthalmology  03/12/17   Jovita Kussmaul, MD Consulting Physician General Surgery  03/12/17   Nicholas Lose, MD Consulting Physician Hematology and Oncology  03/12/17   Garlan Fair, MD Consulting Physician Gastroenterology  04/12/17   Gardenia Phlegm, NP Nurse Practitioner Hematology and Oncology  08/21/19     The following portions of the patient's history were reviewed and updated as appropriate: allergies, current medications, past family history, past medical history, past social history, past surgical history and problem list.  Previous Medications   ATORVASTATIN (LIPITOR) 20 MG TABLET    Take 1 tablet (20 mg total) by mouth at bedtime.   ESOMEPRAZOLE (NEXIUM) 20 MG CAPSULE    Take 20 mg by mouth daily at 12 noon.   LEVOCETIRIZINE (XYZAL) 5 MG TABLET    Take 5 mg by mouth every evening.   LEVOTHYROXINE (SYNTHROID) 100 MCG TABLET    1 tablet every other day alternating with 88 mcg tablet   LEVOTHYROXINE (SYNTHROID) 88 MCG TABLET    *  LOSARTAN (COZAAR) 50 MG TABLET    Take 1 tablet (50 mg total) by mouth daily.   MONTELUKAST (SINGULAIR) 10 MG TABLET    *   OMEGA-3 ACID ETHYL ESTERS (LOVAZA) 1 G CAPSULE    Take 2 capsules (2 g total) by mouth 2 (two) times daily.   VENLAFAXINE XR (EFFEXOR-XR) 75 MG 24 HR CAPSULE    Take 1 capsule (75 mg total) by mouth at bedtime.   VITAMIN D, ERGOCALCIFEROL, (DRISDOL) 1.25 MG (50000 UT) CAPS CAPSULE    TAKE 1 CAPSULE BY MOUTH ONCE WEEKLY    Review of Systems: General:   Denies fever, chills, unexplained weight loss.  Optho/Auditory:   Denies visual changes, blurred vision/LOV Respiratory:   Denies SOB, DOE more than baseline levels.     Cardiovascular:   Denies chest pain, palpitations, new onset peripheral edema  Gastrointestinal:   Denies nausea, vomiting, diarrhea.  Genitourinary: Denies dysuria, freq/ urgency, flank pain or discharge from genitals.  Endocrine:     Denies hot or cold intolerance, polyuria, polydipsia. Musculoskeletal:   + myalgias, denies joint swelling, + arthralgias, denies gait problems.  Skin:  Denies rash, suspicious lesions Neurological:     Denies dizziness, unexplained weakness, numbness  Psychiatric/Behavioral:   Denies mood changes, suicidal or homicidal ideations, hallucinations    Objective:  Blood pressure 126/75, pulse 85, temperature 97.7 F (36.5 C), temperature source Oral, resp. rate 10, height 5\' 7"  (1.702 m), weight 149 lb 3.2 oz (67.7 kg), SpO2 98 %. Body mass index is 23.37 kg/m.   Gen: A & O *3,  No acute distress HEENT:  [place]/AT Back Exam:  Motion: ess wnl's SLR laying: Negative  Palpable tenderness: yes; muscle spasms apprec and pain reproduced upon palation Sensory change: Gross sensation intact to all lumbar and sacral dermatomes.  Reflexes: 2+ at both patellar tendons, 2+ at achilles tendons Strength at foot  Plantar-flexion: 5/5 Dorsi-flexion: 5/5  Leg strength  Within normal limits and equal bilaterally Gait unremarkable. Abdominal:  No tenderness to deep palpation in the entire abdomen, no G/R/R; on lateral aspect where her muscle is, muscle feels tight, and when patient bears down, prominence of abdominal muscle is felt, and when palpate over that, there is tenderness over the palpation of the muscle.

## 2019-09-11 ENCOUNTER — Encounter: Payer: Self-pay | Admitting: Family Medicine

## 2019-09-11 LAB — URINE CULTURE

## 2019-09-22 ENCOUNTER — Telehealth: Payer: Self-pay | Admitting: Gastroenterology

## 2019-09-26 ENCOUNTER — Ambulatory Visit
Admission: RE | Admit: 2019-09-26 | Discharge: 2019-09-26 | Disposition: A | Discharge status: Home / Self Care | Payer: PPO | Source: Ambulatory Visit | Attending: Adult Health | Admitting: Adult Health

## 2019-09-26 ENCOUNTER — Other Ambulatory Visit: Payer: Self-pay

## 2019-09-26 DIAGNOSIS — R928 Other abnormal and inconclusive findings on diagnostic imaging of breast: Secondary | ICD-10-CM | POA: Diagnosis not present

## 2019-09-26 DIAGNOSIS — D0591 Unspecified type of carcinoma in situ of right breast: Secondary | ICD-10-CM

## 2019-09-30 ENCOUNTER — Other Ambulatory Visit: Payer: Self-pay | Admitting: Family Medicine

## 2019-09-30 DIAGNOSIS — E782 Mixed hyperlipidemia: Secondary | ICD-10-CM

## 2019-09-30 DIAGNOSIS — E786 Lipoprotein deficiency: Secondary | ICD-10-CM

## 2019-10-02 ENCOUNTER — Ambulatory Visit: Payer: PPO

## 2019-10-08 ENCOUNTER — Other Ambulatory Visit: Payer: Self-pay

## 2019-10-08 ENCOUNTER — Encounter: Payer: Self-pay | Admitting: Gastroenterology

## 2019-10-08 ENCOUNTER — Ambulatory Visit (AMBULATORY_SURGERY_CENTER): Payer: Self-pay

## 2019-10-08 VITALS — Temp 98.4°F | Ht 67.0 in | Wt 144.0 lb

## 2019-10-08 DIAGNOSIS — Z86010 Personal history of colonic polyps: Secondary | ICD-10-CM

## 2019-10-08 DIAGNOSIS — Z01818 Encounter for other preprocedural examination: Secondary | ICD-10-CM

## 2019-10-08 MED ORDER — NA SULFATE-K SULFATE-MG SULF 17.5-3.13-1.6 GM/177ML PO SOLN: 1.0000 | Freq: Once | ORAL | 0 refills | Status: AC

## 2019-10-08 NOTE — Progress Notes (Signed)
No egg or soy allergy known to patient  No issues with past sedation with any surgeries  or procedures, no intubation problems  No diet pills per patient No home 02 use per patient  No blood thinners per patient  Pt denies issues with constipation  No A fib or A flutter  EMMI video sent to pt's e mail  [place]  Due to the COVID-19 pandemic we are asking patients to follow these guidelines. Please only bring one care partner. Please be aware that your care partner may wait in the car in the parking lot or if they feel like they will be too hot to wait in the car, they may wait in the lobby on the 4th floor. All care partners are required to wear a mask the entire time (we do not have any that we can provide them), they need to practice social distancing, and we will do a Covid check for all patient's and care partners when you arrive. Also we will check their temperature and your temperature. If the care partner waits in their car they need to stay in the parking lot the entire time and we will call them on their cell phone when the patient is ready for discharge so they can bring the car to the front of the building. Also all patient's will need to wear a mask into building.

## 2019-10-11 ENCOUNTER — Ambulatory Visit: Payer: PPO

## 2019-10-14 ENCOUNTER — Other Ambulatory Visit (INDEPENDENT_AMBULATORY_CARE_PROVIDER_SITE_OTHER): Payer: PPO

## 2019-10-14 ENCOUNTER — Other Ambulatory Visit: Payer: Self-pay

## 2019-10-14 NOTE — Progress Notes (Signed)
Patient came in requesting blood pressure check. Blood pressure was 121/74. Patient is aware of the results. AS< CMA

## 2019-10-17 ENCOUNTER — Ambulatory Visit (INDEPENDENT_AMBULATORY_CARE_PROVIDER_SITE_OTHER): Payer: PPO

## 2019-10-17 ENCOUNTER — Other Ambulatory Visit: Payer: Self-pay | Admitting: Gastroenterology

## 2019-10-17 DIAGNOSIS — Z1159 Encounter for screening for other viral diseases: Secondary | ICD-10-CM | POA: Diagnosis not present

## 2019-10-18 LAB — SARS CORONAVIRUS 2 (TAT 6-24 HRS): SARS Coronavirus 2: NEGATIVE

## 2019-10-22 ENCOUNTER — Other Ambulatory Visit: Payer: Self-pay

## 2019-10-22 ENCOUNTER — Encounter: Payer: Self-pay | Admitting: Gastroenterology

## 2019-10-22 ENCOUNTER — Ambulatory Visit (AMBULATORY_SURGERY_CENTER): Payer: PPO | Admitting: Gastroenterology

## 2019-10-22 VITALS — BP 110/62 | HR 80 | Temp 97.3°F | Resp 15 | Ht 67.0 in | Wt 144.0 lb

## 2019-10-22 DIAGNOSIS — D122 Benign neoplasm of ascending colon: Secondary | ICD-10-CM | POA: Diagnosis not present

## 2019-10-22 DIAGNOSIS — Z86010 Personal history of colonic polyps: Secondary | ICD-10-CM | POA: Diagnosis not present

## 2019-10-22 DIAGNOSIS — I10 Essential (primary) hypertension: Secondary | ICD-10-CM | POA: Diagnosis not present

## 2019-10-22 DIAGNOSIS — D120 Benign neoplasm of cecum: Secondary | ICD-10-CM

## 2019-10-22 DIAGNOSIS — K219 Gastro-esophageal reflux disease without esophagitis: Secondary | ICD-10-CM | POA: Diagnosis not present

## 2019-10-22 DIAGNOSIS — E039 Hypothyroidism, unspecified: Secondary | ICD-10-CM | POA: Diagnosis not present

## 2019-10-22 MED ORDER — SODIUM CHLORIDE 0.9 % IV SOLN: 500.0000 mL | Freq: Once | INTRAVENOUS | Status: DC

## 2019-10-22 NOTE — Patient Instructions (Signed)
Thank you for allowing [place] to care for you today!  Await pathology results by mail, approximately 2 weeks.  Will make recommendations after results are final.  Resume previous diet and medications today.  Return to your normal activities tomorro      YOU HAD AN ENDOSCOPIC PROCEDURE TODAY AT [facility]:   Refer to the procedure report that was given to you for any specific questions about what was found during the examination.  If the procedure report does not answer your questions, please call your gastroenterologist to clarify.  If you requested that your care partner not be given the details of your procedure findings, then the procedure report has been included in a sealed envelope for you to review at your convenience later.  YOU SHOULD EXPECT: Some feelings of bloating in the abdomen. Passage of more gas than usual.  Walking can help get rid of the air that was put into your GI tract during the procedure and reduce the bloating. If you had a lower endoscopy (such as a colonoscopy or flexible sigmoidoscopy) you may notice spotting of blood in your stool or on the toilet paper. If you underwent a bowel prep for your procedure, you may not have a normal bowel movement for a few days.  Please Note:  You might notice some irritation and congestion in your nose or some drainage.  This is from the oxygen used during your procedure.  There is no need for concern and it should clear up in a day or so.  SYMPTOMS TO REPORT IMMEDIATELY:   Following lower endoscopy (colonoscopy or flexible sigmoidoscopy):  Excessive amounts of blood in the stool  Significant tenderness or worsening of abdominal pains  Swelling of the abdomen that is new, acute  Fever of 100F or higher   For urgent or emergent issues, a gastroenterologist can be reached at any hour by calling [phone].   DIET:  We do recommend a small meal at first, but then you may proceed to your regular diet.  Drink  plenty of fluids but you should avoid alcoholic beverages for 24 hours.  ACTIVITY:  You should plan to take it easy for the rest of today and you should NOT DRIVE or use heavy machinery until tomorrow (because of the sedation medicines used during the test).    FOLLOW UP: Our staff will call the number listed on your records 48-72 hours following your procedure to check on you and address any questions or concerns that you may have regarding the information given to you following your procedure. If we do not reach you, we will leave a message.  We will attempt to reach you two times.  During this call, we will ask if you have developed any symptoms of COVID 19. If you develop any symptoms (ie: fever, flu-like symptoms, shortness of breath, cough etc.) before then, please call [phone].  If you test positive for Covid 19 in the 2 weeks post procedure, please call and report this information to [place].    If any biopsies were taken you will be contacted by phone or by letter within the next 1-3 weeks.  Please call us at [phone] if you have not heard about the biopsies in 3 weeks.    SIGNATURES/CONFIDENTIALITY: You and/or your care partner have signed paperwork which will be entered into your electronic medical record.  These signatures attest to the fact that that the information above on your After Visit Summary has been reviewed  and is understood.  Full responsibility of the confidentiality of this discharge information lies with you and/or your care-partner. 

## 2019-10-22 NOTE — Progress Notes (Signed)
Report to PACU, RN, vss, BBS= Clear.  

## 2019-10-22 NOTE — Progress Notes (Signed)
Called to room to assist during endoscopic procedure.  Patient ID and intended procedure confirmed with present staff. Received instructions for my participation in the procedure from the performing physician.  

## 2019-10-22 NOTE — Op Note (Signed)
[facility] Patient Name: Lindsey Price Race Procedure Date: 10/22/2019 8:15 AM MRN: [id number] Endoscopist: Mauri Pole , MD Age: 74 Referring MD:  Date of Birth: [birth year] Gender: Female Account #: [id number] Procedure:                Colonoscopy Indications:              High risk colon cancer surveillance: Personal                            history of colonic polyps, High risk colon cancer                            surveillance: Personal history of adenoma less than                            10 mm in size Medicines:                Monitored Anesthesia Care Procedure:                Pre-Anesthesia Assessment:                           - Prior to the procedure, a History and Physical                            was performed, and patient medications and                            allergies were reviewed. The patient's tolerance of                            previous anesthesia was also reviewed. The risks                            and benefits of the procedure and the sedation                            options and risks were discussed with the patient.                            All questions were answered, and informed consent                            was obtained. Prior Anticoagulants: The patient has                            taken no previous anticoagulant or antiplatelet                            agents. ASA Grade Assessment: II - A patient with                            mild systemic disease. After reviewing the risks  and benefits, the patient was deemed in                            satisfactory condition to undergo the procedure.                           After obtaining informed consent, the colonoscope                            was passed under direct vision. Throughout the                            procedure, the patient's blood pressure, pulse, and                            oxygen saturations were monitored  continuously. The                            Colonoscope was introduced through the anus and                            advanced to the the cecum, identified by                            appendiceal orifice and ileocecal valve. The                            colonoscopy was performed without difficulty. The                            patient tolerated the procedure well. The quality                            of the bowel preparation was excellent. The                            ileocecal valve, appendiceal orifice, and rectum                            were photographed. Scope In: 8:22:53 AM Scope Out: 8:41:39 AM Scope Withdrawal Time: 0 hours 12 minutes 0 seconds  Total Procedure Duration: 0 hours 18 minutes 46 seconds  Findings:                 The perianal and digital rectal examinations were                            normal.                           A 2 mm polyp was found in the cecum. The polyp was                            sessile. The polyp was removed with a cold biopsy  forceps. Resection and retrieval were complete.                           Two sessile polyps were found in the ascending                            colon. The polyps were 10 to 12 mm in size. These                            polyps were removed with a cold snare. Resection                            and retrieval were complete.                           Non-bleeding internal hemorrhoids were found during                            retroflexion. The hemorrhoids were small. Complications:            No immediate complications. Estimated Blood Loss:     Estimated blood loss: none. Impression:               - One 2 mm polyp in the cecum, removed with a cold                            biopsy forceps. Resected and retrieved.                           - Two 10 to 12 mm polyps in the ascending colon,                            removed with a cold snare. Resected and retrieved.                            - Non-bleeding internal hemorrhoids. Recommendation:           - Patient has a contact number available for                            emergencies. The signs and symptoms of potential                            delayed complications were discussed with the                            patient. Return to normal activities tomorrow.                            Written discharge instructions were provided to the                            patient.                           -  Resume previous diet.                           - Continue present medications.                           - Await pathology results.                           - Repeat colonoscopy in 3 years for surveillance                            based on pathology results. Mauri Pole, MD 10/22/2019 8:54:27 AM This report has been signed electronically.

## 2019-10-22 NOTE — Progress Notes (Signed)
Pt's states no medical or surgical changes since previsit or office visit. Temp by JB. VS by CW.

## 2019-10-24 ENCOUNTER — Telehealth: Payer: Self-pay

## 2019-10-24 NOTE — Telephone Encounter (Signed)
  Follow up Call-  Call back number 10/22/2019  Post procedure Call Back phone  # [phone]  Permission to leave phone message Yes  Some recent data might be hidden     Patient questions:  Do you have a fever, pain , or abdominal swelling? No. Pain Score  0 *  Have you tolerated food without any problems? Yes.    Have you been able to return to your normal activities? Yes.    Do you have any questions about your discharge instructions: Diet   No. Medications  No. Follow up visit  No.  Do you have questions or concerns about your Care? No.  Actions: * If pain score is 4 or above: No action needed, pain <4.  1. Have you developed a fever since your procedure? no  2.   Have you had an respiratory symptoms (SOB or cough) since your procedure? no  3.   Have you tested positive for COVID 19 since your procedure no  4.   Have you had any family members/close contacts diagnosed with the COVID 19 since your procedure?  no   If yes to any of these questions please route to Joylene [name], RN and Alphonsa Gin, Therapist, sports.

## 2019-10-27 ENCOUNTER — Encounter: Payer: Self-pay | Admitting: Gastroenterology

## 2019-10-31 ENCOUNTER — Ambulatory Visit
Admission: RE | Admit: 2019-10-31 | Discharge: 2019-10-31 | Disposition: A | Discharge status: Home / Self Care | Payer: PPO | Source: Ambulatory Visit | Attending: Adult Health | Admitting: Adult Health

## 2019-10-31 ENCOUNTER — Telehealth: Payer: Self-pay

## 2019-10-31 ENCOUNTER — Other Ambulatory Visit: Payer: Self-pay

## 2019-10-31 DIAGNOSIS — M85852 Other specified disorders of bone density and structure, left thigh: Secondary | ICD-10-CM

## 2019-10-31 DIAGNOSIS — M8589 Other specified disorders of bone density and structure, multiple sites: Secondary | ICD-10-CM | POA: Diagnosis not present

## 2019-10-31 DIAGNOSIS — D0591 Unspecified type of carcinoma in situ of right breast: Secondary | ICD-10-CM

## 2019-10-31 DIAGNOSIS — Z780 Asymptomatic menopausal state: Secondary | ICD-10-CM | POA: Diagnosis not present

## 2019-10-31 NOTE — Telephone Encounter (Signed)
-----   Message from Gardenia Phlegm, NP sent at 10/31/2019  2:58 PM EST ----- Please call patient and let her know she has osteopenia.  Calcium, vitamin d and weight bearing exercises are recommended.   ----- Message ----- From: Interface, Rad Results In Sent: 10/31/2019   1:13 PM EST To: Gardenia Phlegm, NP

## 2019-10-31 NOTE — Telephone Encounter (Signed)
Called and left below message. Ask her to call the office for questions. ?

## 2019-11-04 NOTE — Telephone Encounter (Signed)
Pt seen on 10/22/2019

## 2019-11-07 ENCOUNTER — Other Ambulatory Visit: Payer: PPO

## 2019-11-07 ENCOUNTER — Other Ambulatory Visit: Payer: Self-pay | Admitting: Family Medicine

## 2019-11-07 ENCOUNTER — Other Ambulatory Visit: Payer: Self-pay

## 2019-11-07 DIAGNOSIS — I10 Essential (primary) hypertension: Secondary | ICD-10-CM

## 2019-11-07 DIAGNOSIS — E782 Mixed hyperlipidemia: Secondary | ICD-10-CM

## 2019-11-07 NOTE — Progress Notes (Signed)
Orders placed from note on 09/09/19 :  Return if symptoms worsen or fail to improve; Also f/up per OV note on 08/21/19--->started lipitor, for pt will need re-check FLP, CMP, in 2.5 mo after starting lipitor, OV 1 wk later.

## 2019-11-08 LAB — COMPREHENSIVE METABOLIC PANEL
ALT: 16 IU/L (ref 0–32)
AST: 12 IU/L (ref 0–40)
Albumin/Globulin Ratio: 1.1 — ABNORMAL LOW (ref 1.2–2.2)
Albumin: 3.5 g/dL — ABNORMAL LOW (ref 3.7–4.7)
Alkaline Phosphatase: 95 IU/L (ref 39–117)
BUN/Creatinine Ratio: 15 (ref 12–28)
BUN: 13 mg/dL (ref 8–27)
Bilirubin Total: 0.4 mg/dL (ref 0.0–1.2)
CO2: 23 mmol/L (ref 20–29)
Calcium: 9.1 mg/dL (ref 8.7–10.3)
Chloride: 102 mmol/L (ref 96–106)
Creatinine, Ser: 0.85 mg/dL (ref 0.57–1.00)
GFR calc Af Amer: 78 mL/min/{1.73_m2} (ref >59)
GFR calc non Af Amer: 67 mL/min/{1.73_m2} (ref >59)
Globulin, Total: 3.3 g/dL (ref 1.5–4.5)
Glucose: 90 mg/dL (ref 65–99)
Potassium: 4.6 mmol/L (ref 3.5–5.2)
Sodium: 139 mmol/L (ref 134–144)
Total Protein: 6.8 g/dL (ref 6.0–8.5)

## 2019-11-08 LAB — LIPID PANEL
Chol/HDL Ratio: 3.6 ratio (ref 0.0–4.4)
Cholesterol, Total: 119 mg/dL (ref 100–199)
HDL: 33 mg/dL — ABNORMAL LOW (ref >39)
LDL Chol Calc (NIH): 71 mg/dL (ref 0–99)
Triglycerides: 74 mg/dL (ref 0–149)
VLDL Cholesterol Cal: 15 mg/dL (ref 5–40)

## 2019-11-11 ENCOUNTER — Other Ambulatory Visit: Payer: PPO

## 2019-11-12 ENCOUNTER — Ambulatory Visit (INDEPENDENT_AMBULATORY_CARE_PROVIDER_SITE_OTHER): Payer: PPO | Admitting: Family Medicine

## 2019-11-12 ENCOUNTER — Encounter: Payer: Self-pay | Admitting: Family Medicine

## 2019-11-12 ENCOUNTER — Other Ambulatory Visit: Payer: Self-pay

## 2019-11-12 VITALS — BP 104/68 | HR 100 | Temp 97.3°F | Resp 12 | Ht 67.0 in | Wt 143.0 lb

## 2019-11-12 DIAGNOSIS — E782 Mixed hyperlipidemia: Secondary | ICD-10-CM | POA: Diagnosis not present

## 2019-11-12 DIAGNOSIS — E786 Lipoprotein deficiency: Secondary | ICD-10-CM | POA: Diagnosis not present

## 2019-11-12 DIAGNOSIS — E559 Vitamin D deficiency, unspecified: Secondary | ICD-10-CM

## 2019-11-12 DIAGNOSIS — E039 Hypothyroidism, unspecified: Secondary | ICD-10-CM | POA: Diagnosis not present

## 2019-11-12 DIAGNOSIS — I10 Essential (primary) hypertension: Secondary | ICD-10-CM

## 2019-11-12 DIAGNOSIS — E8881 Metabolic syndrome: Secondary | ICD-10-CM | POA: Diagnosis not present

## 2019-11-12 MED ORDER — LOSARTAN POTASSIUM 25 MG PO TABS: 25.0000 mg | ORAL_TABLET | Freq: Every day | ORAL | 0 refills | Status: DC

## 2019-11-12 MED ORDER — ATORVASTATIN CALCIUM 10 MG PO TABS: 10.0000 mg | ORAL_TABLET | Freq: Every day | ORAL | 0 refills | Status: AC

## 2019-11-12 MED ORDER — EZETIMIBE 10 MG PO TABS: 10.0000 mg | ORAL_TABLET | Freq: Every day | ORAL | 3 refills | Status: DC

## 2019-11-12 NOTE — Patient Instructions (Addendum)
Please look into ordering the OMRON Series 3 upper arm blood pressure cuff.  Since we are decreasing your blood pressure medicine of losartan from 50-25, please be checking your blood pressure regularly and bring in a log of what your blood pressure and heart rates are running for next office visit.  Also, I sent in a new prescription for Lipitor 10 mg and for you to start your new Zetia as well.  Guidelines for a Low Cholesterol, Low Saturated Fat Diet   Fats - Limit total intake of fats and oils. - Avoid butter, stick margarine, shortening, lard, palm and coconut oils. - Limit mayonnaise, salad dressings, gravies and sauces, unless they are homemade with low-fat ingredients. - Limit chocolate. - Choose low-fat and nonfat products, such as low-fat mayonnaise, low-fat or non-hydrogenated peanut butter, low-fat or fat-free salad dressings and nonfat gravy. - Use vegetable oil, such as canola or olive oil. - Look for margarine that does not contain trans fatty acids. - Use nuts in moderate amounts. - Read ingredient labels carefully to determine both amount and type of fat present in foods. Limit saturated and trans fats! - Avoid high-fat processed and convenience foods.  Meats and Meat Alternatives - Choose fish, chicken, [place] and lean meats. - Use dried beans, peas, lentils and tofu. - Limit egg yolks to three to four per week. - If you eat red meat, limit to no more than three servings per week and choose loin or round cuts. - Avoid fatty meats, such as bacon, sausage, franks, luncheon meats and ribs. - Avoid all organ meats, including liver.  Dairy - Choose nonfat or low-fat milk, yogurt and cottage cheese. - Most cheeses are high in fat. Choose cheeses made from non-fat milk, such as mozzarella and ricotta cheese. - Choose light or fat-free cream cheese and sour cream. - Avoid cream and sauces made with cream.  Fruits and Vegetables - Eat a wide variety of fruits and  vegetables. - Use lemon juice, vinegar or "mist" olive oil on vegetables. - Avoid adding sauces, fat or oil to vegetables.  Breads, Cereals and Grains - Choose whole-grain breads, cereals, pastas and rice. - Avoid high-fat snack foods, such as granola, cookies, pies, pastries, doughnuts and croissants.  Cooking Tips - Avoid deep fried foods. - Trim visible fat off meats and remove skin from poultry before cooking. - Bake, broil, boil, poach or roast poultry, fish and lean meats. - Drain and discard fat that drains out of meat as you cook it. - Add little or no fat to foods. - Use vegetable oil sprays to grease pans for cooking or baking. - Steam vegetables. - Use herbs or no-oil marinades to flavor foods.    Your goal blood pressure should be 130/80 or less on a regular basis, or medications should be started/ modified.    Normal blood pressure is less than 120/80.    Hypertension Hypertension, commonly called high blood pressure, is when the force of blood pumping through the arteries is too strong. The arteries are the blood vessels that carry blood from the heart throughout the body. Hypertension forces the heart to work harder to pump blood and may cause arteries to become narrow or stiff. Having untreated or uncontrolled hypertension can cause heart attacks, strokes, kidney disease, and other problems. A blood pressure reading consists of a higher number over a lower number. Ideally, your blood pressure should be below 120/80. The first ("top") number is called the systolic pressure. It is a  measure of the pressure in your arteries as your heart beats. The second ("bottom") number is called the diastolic pressure. It is a measure of the pressure in your arteries as the heart relaxes. What are the causes? The cause of this condition is not known. What increases the risk? Some risk factors for high blood pressure are under your control. Others are not. Factors you can  change  Smoking.  Having type 2 diabetes mellitus, high cholesterol, or both.  Not getting enough exercise or physical activity.  Being overweight.  Having too much fat, sugar, calories, or salt (sodium) in your diet.  Drinking too much alcohol. Factors that are difficult or impossible to change  Having chronic kidney disease.  Having a family history of high blood pressure.  Age. Risk increases with age.  Race. You may be at higher risk if you are African-American.  Gender. Men are at higher risk than women before age 28. After age 82, women are at higher risk than men.  Having obstructive sleep apnea.  Stress. What are the signs or symptoms? Extremely high blood pressure (hypertensive crisis) may cause:  Headache.  Anxiety.  Shortness of breath.  Nosebleed.  Nausea and vomiting.  Severe chest pain.  Jerky movements you cannot control (seizures).  How is this diagnosed? This condition is diagnosed by measuring your blood pressure while you are seated, with your arm resting on a surface. The cuff of the blood pressure monitor will be placed directly against the skin of your upper arm at the level of your heart. It should be measured at least twice using the same arm. Certain conditions can cause a difference in blood pressure between your right and left arms. Certain factors can cause blood pressure readings to be lower or higher than normal (elevated) for a short period of time:  When your blood pressure is higher when you are in a health care provider's office than when you are at home, this is called white coat hypertension. Most people with this condition do not need medicines.  When your blood pressure is higher at home than when you are in a health care provider's office, this is called masked hypertension. Most people with this condition may need medicines to control blood pressure.  If you have a high blood pressure reading during one visit or you have  normal blood pressure with other risk factors:  You may be asked to return on a different day to have your blood pressure checked again.  You may be asked to monitor your blood pressure at home for 1 week or longer.  If you are diagnosed with hypertension, you may have other blood or imaging tests to help your health care provider understand your overall risk for other conditions. How is this treated? This condition is treated by making healthy lifestyle changes, such as eating healthy foods, exercising more, and reducing your alcohol intake. Your health care provider may prescribe medicine if lifestyle changes are not enough to get your blood pressure under control, and if:  Your systolic blood pressure is above 130.  Your diastolic blood pressure is above 80.  Your personal target blood pressure may vary depending on your medical conditions, your age, and other factors. Follow these instructions at home: Eating and drinking  Eat a diet that is high in fiber and potassium, and low in sodium, added sugar, and fat. An example eating plan is called the DASH (Dietary Approaches to Stop Hypertension) diet. To eat this way: ?  Eat plenty of fresh fruits and vegetables. Try to fill half of your plate at each meal with fruits and vegetables. ? Eat whole grains, such as whole wheat pasta, brown rice, or whole grain bread. Fill about one quarter of your plate with whole grains. ? Eat or drink low-fat dairy products, such as skim milk or low-fat yogurt. ? Avoid fatty cuts of meat, processed or cured meats, and poultry with skin. Fill about one quarter of your plate with lean proteins, such as fish, chicken without skin, beans, eggs, and tofu. ? Avoid premade and processed foods. These tend to be higher in sodium, added sugar, and fat.  Reduce your daily sodium intake. Most people with hypertension should eat less than 1,500 mg of sodium a day.  Limit alcohol intake to no more than 1 drink a day for  nonpregnant women and 2 drinks a day for men. One drink equals 12 oz of beer, 5 oz of wine, or 1 oz of hard liquor. Lifestyle  Work with your health care provider to maintain a healthy body weight or to lose weight. Ask what an ideal weight is for you.  Get at least 30 minutes of exercise that causes your heart to beat faster (aerobic exercise) most days of the week. Activities may include walking, swimming, or biking.  Include exercise to strengthen your muscles (resistance exercise), such as pilates or lifting weights, as part of your weekly exercise routine. Try to do these types of exercises for 30 minutes at least 3 days a week.  Do not use any products that contain nicotine or tobacco, such as cigarettes and e-cigarettes. If you need help quitting, ask your health care provider.  Monitor your blood pressure at home as told by your health care provider.  Keep all follow-up visits as told by your health care provider. This is important. Medicines  Take over-the-counter and prescription medicines only as told by your health care provider. Follow directions carefully. Blood pressure medicines must be taken as prescribed.  Do not skip doses of blood pressure medicine. Doing this puts you at risk for problems and can make the medicine less effective.  Ask your health care provider about side effects or reactions to medicines that you should watch for. Contact a health care provider if:  You think you are having a reaction to a medicine you are taking.  You have headaches that keep coming back (recurring).  You feel dizzy.  You have swelling in your ankles.  You have trouble with your vision. Get help right away if:  You develop a severe headache or confusion.  You have unusual weakness or numbness.  You feel faint.  You have severe pain in your chest or abdomen.  You vomit repeatedly.  You have trouble breathing. Summary  Hypertension is when the force of blood pumping  through your arteries is too strong. If this condition is not controlled, it may put you at risk for serious complications.  Your personal target blood pressure may vary depending on your medical conditions, your age, and other factors. For most people, a normal blood pressure is less than 120/80.  Hypertension is treated with lifestyle changes, medicines, or a combination of both. Lifestyle changes include weight loss, eating a healthy, low-sodium diet, exercising more, and limiting alcohol. This information is not intended to replace advice given to you by your health care provider. Make sure you discuss any questions you have with your health care provider. Document Released: 08/21/2005 Document Revised:  07/19/2016 Document Reviewed: 07/19/2016 Elsevier Interactive Patient Education  2018 [employer].    How to Take Your Blood Pressure   Blood pressure is a measurement of how strongly your blood is pressing against the walls of your arteries. Arteries are blood vessels that carry blood from your heart throughout your body. Your health care provider takes your blood pressure at each office visit. You can also take your own blood pressure at home with a blood pressure machine. You may need to take your own blood pressure:  To confirm a diagnosis of high blood pressure (hypertension).  To monitor your blood pressure over time.  To make sure your blood pressure medicine is working.  Supplies needed: To take your blood pressure, you will need a blood pressure machine. You can buy a blood pressure machine, or blood pressure monitor, at most drugstores or online. There are several types of home blood pressure monitors. When choosing one, consider the following:  Choose a monitor that has an arm cuff.  Choose a monitor that wraps snugly around your upper arm. You should be able to fit only one finger between your arm and the cuff.  Do not choose a monitor that measures your blood pressure  from your wrist or finger.  Your health care provider can suggest a reliable monitor that will meet your needs. How to prepare To get the most accurate reading, avoid the following for 30 minutes before you check your blood pressure:  Drinking caffeine.  Drinking alcohol.  Eating.  Smoking.  Exercising.  Five minutes before you check your blood pressure:  Empty your bladder.  Sit quietly without talking in a dining chair, rather than in a soft couch or armchair.  How to take your blood pressure To check your blood pressure, follow the instructions in the manual that came with your blood pressure monitor. If you have a digital blood pressure monitor, the instructions may be as follows: 1. Sit up straight. 2. Place your feet on the floor. Do not cross your ankles or legs. 3. Rest your left arm at the level of your heart on a table or desk or on the arm of a chair. 4. Pull up your shirt sleeve. 5. Wrap the blood pressure cuff around the upper part of your left arm, 1 inch (2.5 cm) above your elbow. It is best to wrap the cuff around bare skin. 6. Fit the cuff snugly around your arm. You should be able to place only one finger between the cuff and your arm. 7. Position the cord inside the groove of your elbow. 8. Press the power button. 9. Sit quietly while the cuff inflates and deflates. 10. Read the digital reading on the monitor screen and write it down (record it). 11. Wait 2-3 minutes, then repeat the steps, starting at step 1.  What does my blood pressure reading mean? A blood pressure reading consists of a higher number over a lower number. Ideally, your blood pressure should be below 120/80. The first ("top") number is called the systolic pressure. It is a measure of the pressure in your arteries as your heart beats. The second ("bottom") number is called the diastolic pressure. It is a measure of the pressure in your arteries as the heart relaxes. Blood pressure is  classified into four stages. The following are the stages for adults who do not have a short-term serious illness or a chronic condition. Systolic pressure and diastolic pressure are measured in a unit called mm Hg.  Normal  Systolic pressure: below 123456.  Diastolic pressure: below 80. Elevated  Systolic pressure: [id number].  Diastolic pressure: below 80. Hypertension stage 1  Systolic pressure: 0000000.  Diastolic pressure: [id number]. Hypertension stage 2  Systolic pressure: [id number] or above.  Diastolic pressure: 90 or above. You can have prehypertension or hypertension even if only the systolic or only the diastolic number in your reading is higher than normal. Follow these instructions at home:  Check your blood pressure as often as recommended by your health care provider.  Take your monitor to the next appointment with your health care provider to make sure: ? That you are using it correctly. ? That it provides accurate readings.  Be sure you understand what your goal blood pressure numbers are.  Tell your health care provider if you are having any side effects from blood pressure medicine. Contact a health care provider if:  Your blood pressure is consistently high. Get help right away if:  Your systolic blood pressure is higher than 180.  Your diastolic blood pressure is higher than 110. This information is not intended to replace advice given to you by your health care provider. Make sure you discuss any questions you have with your health care provider. Document Released: 01/28/2016 Document Revised: 04/11/2016 Document Reviewed: 01/28/2016 Elsevier Interactive Patient Education  [employer].

## 2019-11-12 NOTE — Progress Notes (Signed)
Impression and Recommendations:    1. Essential hypertension   2. Hyperlipemia, mixed   3. Low HDL (under 40)   4. Insulin resistance/ pre-DM   5. Acquired hypothyroidism   6. Vitamin D deficiency     - Reviewed recent lab work (11/07/2019) in depth with patient today.  All lab work within normal limits unless otherwise noted.  Extensive education provided and all questions answered.  - To maintain the health of her kidneys, encouraged patient to continue to maintain a blood pressure WNL, maintain blood sugar WNL, hydrate adequately, engage in adequate physical activity, and avoid nephrotoxic substances.  Essential Hypertension - Blood pressure currently is stable, at goal.  - Per patient, her blood pressure is sometimes low as 90/54. - Advised patient of need to avoid symptomatic low blood pressures.  Patient knows to monitor for symptoms such as dizziness, lightheadedness, or sluggishness, especially associated with low blood pressures.  - Reduce dose of losartan to 25 mg daily.  See med list. - Reviewed goal BP of less than 130/80 on a regular basis.  Discussed that as long as she is asymptomatic, lower blood pressures within normal limits are considered healthy for a woman of her age and physical fitness.  - Advised patient to monitor her resting heart rates and wait 10-15 minutes before checking her blood pressures and heart rate at home.  If patient's heart rates run consistently over 100, she knows to call and inform clinic for further assistance.  - Counseled patient on pathophysiology of disease and discussed various treatment options, which always includes dietary and lifestyle modification as first line.   - Lifestyle changes such as dash and heart healthy diets and engaging in a regular exercise program discussed extensively with patient.   - Ambulatory blood pressure monitoring encouraged at least 3 times weekly.  Keep log and bring in every office visit.  Reminded  patient that if they ever feel poorly in any way, to check their blood pressure and pulse.  - We will continue to monitor.  Mixed Hyperlipidemia, Low HDL (under 40) - Patient started Lipitor 20 mg. on 08/21/2019. - Per patient, has been taking a half-tablet, 10 mg nightly.  - Discussed patient's trends in lipid panels during appointment today:  FLP 5 days ago: LDL = 71, WNL. HDL = 33, low. Triglycerides = 74.  FLP on 08/13/2019: LDL = 82, WNL. HDL = 41, WNL. Triglycerides = 53, WNL.  FLP in 12 of 2019: LDL = 89, WNL. HDL = 69, WNL. Triglycerides = 46, WNL.  - Recommended continuing 10 mg Lipitor, a half-tablet nightly.  - Resume Zetia 10 mg.  See med list. - Back on 08/2018, patient was managed on 5 mg Simvastatin and 10 mg Zetia, and patient's HDL was in the high 60's.  Our goal is to increase patient's HDL value.  - Continue Lovaza as prescribed.  - Dietary changes such as low saturated & trans fat diets for hyperlipidemia and low carb diets for hypertriglyceridemia discussed with patient.    - To help improve HDL, encouraged patient to follow AHA guidelines for regular exercise.  - We will continue to monitor and re-check lab-only FLP and ALT end of April.  Insulin resistance/ pre-DM - A1c 5.6 last check, stable. - Continue prudent lifestyle habits. - Will continue to monitor and re-check as discussed.  Acquired hypothyroidism - Stable at this time. - Continue treatment plan as established. See med list. - Will continue to  monitor and re-check as discussed.  Vitamin D deficiency - Stable at 66.8 last check. - Continue supplementation as established.  See med list. - Will continue to monitor and re-check as discussed.  Musculoskeletal Back Pain - Reviewed patient's symptoms and concerns during appointment today.  - To help reduce musculoskeletal pain, encouraged patient to work on calming her body, stretching her body, engaging yoga, meditation, and  stretching after she exercises.  - Prudent techniques and management discussed, and health counseling provided.  - Will continue to monitor.  Recommendations - Re-check FLP and ALT end of April, with OV 3 days later.   Orders Placed This Encounter  Procedures  . ALT  . Lipid panel    Meds ordered this encounter  Medications  . ezetimibe (ZETIA) 10 MG tablet    Sig: Take 1 tablet (10 mg total) by mouth daily.    Dispense:  90 tablet    Refill:  3  . losartan (COZAAR) 25 MG tablet    Sig: Take 1 tablet (25 mg total) by mouth daily.    Dispense:  90 tablet    Refill:  0  . atorvastatin (LIPITOR) 10 MG tablet    Sig: Take 1 tablet (10 mg total) by mouth at bedtime.    Dispense:  90 tablet    Refill:  0    Medications Discontinued During This Encounter  Medication Reason  . losartan (COZAAR) 50 MG tablet Reorder  . atorvastatin (LIPITOR) 20 MG tablet Reorder     Gross side effects, risk and benefits, and alternatives of medications and treatment plan in general discussed with patient.  Patient is aware that all medications have potential side effects and we are unable to predict every side effect or drug-drug interaction that may occur.   Patient will call with any questions prior to using medication if they have concerns.    Expresses verbal understanding and consents to current therapy and treatment regimen.  No barriers to understanding were identified.  Red flag symptoms and signs discussed in detail.  Patient expressed understanding regarding what to do in case of emergency\urgent symptoms  Please see AVS handed out to patient at the end of our visit for further patient instructions/ counseling done pertaining to today's office visit.   Return for f/up 7 weeks, end April, for lab-only FLP and ALT, with OV 3 days later.     Note:  This note was prepared with assistance of Dragon voice recognition software. Occasional wrong-word or sound-a-like substitutions may have  occurred due to the inherent limitations of voice recognition software.   The [place] was signed into law in 2016 which includes the topic of electronic health records.  This provides immediate access to information in MyChart.  This includes consultation notes, operative notes, office notes, lab results and pathology reports.  If you have any questions about what you read please let us know at your next visit or call us at the office.  We are right here with you.   This case required medical decision making of at least moderate complexity.  This document serves as a record of services personally performed by Mellody Dance, DO. It was created on her behalf by [name], a trained medical scribe. The creation of this record is based on the scribe's personal observations and the provider's statements to them.   This case required medical decision making of at least moderate complexity. The above documentation from [name], medical scribe, has been reviewed  by Marjory Sneddon, D.O.       --------------------------------------------------------------------------------------------------------------------------------------------------------------------------------------------------------------------------------------------    Subjective:     [name], am serving as scribe for Dr.Abdirahim Flavell.   HPI: Jeniffer Culliver is a 74 y.o. female who presents to [facility] today for issues as discussed below.  - Exercise She is walking 4 miles per day at least 4 days per week.  Says after she had her bone density and hip assessed, she "hadn't even gotten home before the oncologist office called, and they said walk, walk, walk."  Notes in the past, she used to work out a bit more, using the elliptical machine.  - Sleep  Notes she sleeps "beautifully" at night.  - Back Pain Notes her back hurts time to time.  Says she  thinks it's muscle-related.  Notes she avoids some activities because she doesn't want to have surgery again.  HPI:  Hyperlipidemia:  74 y.o. female here for cholesterol follow-up.   Notes she cuts her Lipitor dose in half.  Her knees were aching, and since she began cutting her Lipitor in half, she hasn't had any problems.  Now, her symptoms are resolved.  - Patient denies any acute concerns or problems with management plan   - She denies new onset of: myalgias, arthralgias, increased fatigue more than normal, chest pains, exercise intolerance, shortness of breath, dizziness, visual changes, headache, lower extremity swelling or claudication.   Most recent cholesterol panel was:  Lab Results  Component Value Date   CHOL 119 11/07/2019   HDL 33 (L) 11/07/2019   LDLCALC 71 11/07/2019   TRIG 74 11/07/2019   CHOLHDL 3.6 11/07/2019   Hepatic Function Latest Ref Rng & Units 11/07/2019 02/06/2019 03/28/2018  Total Protein 6.0 - 8.5 g/dL 6.8 6.5 6.2  Albumin 3.7 - 4.7 g/dL 3.5(L) 4.1 3.7  AST 0 - 40 IU/L '12 19 17  '$ ALT 0 - 32 IU/L '16 21 17  '$ Alk Phosphatase 39 - 117 IU/L 95 101 93  Total Bilirubin 0.0 - 1.2 mg/dL 0.4 0.4 0.4   HPI:  Hypertension:  Says sometimes her blood pressure is really low.  Reports that she stopped checking her blood pressure "because it scared me."  Her lowest blood pressures have been measured around 90/54.    Notes she's unsure if she's tired at times because of low blood pressure, or tired because she's felt a little more depressed and sleepy during COVID-19.  - Patient reports good compliance with medication and/or lifestyle modification.  Confirms she is taking her full dose of losartan 50.  Says her heart rate usually runs around 89 to 96.  - Her denies acute concerns or problems related to treatment plan  - She denies new onset of: chest pain, exercise intolerance, shortness of breath, dizziness, visual changes, headache, lower extremity swelling or  claudication.   Last 3 blood pressure readings in our office are as follows: BP Readings from Last 3 Encounters:  11/12/19 104/68  10/22/19 110/62  10/14/19 121/74   Filed Weights   11/12/19 0838  Weight: 143 lb (64.9 kg)         Wt Readings from Last 3 Encounters:  11/12/19 143 lb (64.9 kg)  10/22/19 144 lb (65.3 kg)  10/08/19 144 lb (65.3 kg)   BP Readings from Last 3 Encounters:  11/12/19 104/68  10/22/19 110/62  10/14/19 121/74   Pulse Readings from Last 3 Encounters:  11/12/19 100  10/22/19 80  09/09/19 85  BMI Readings from Last 3 Encounters:  11/12/19 22.40 kg/m  10/22/19 22.55 kg/m  10/08/19 22.55 kg/m     Patient Care Team    Relationship Specialty Notifications Start End  Mellody Dance, DO PCP - General Family Medicine  03/12/17   Juanito Doom, MD Consulting Physician Pulmonary Disease  03/12/17   Elsie Saas, MD Consulting Physician Orthopedic Surgery  03/12/17    Comment: L rot cuff repair  Allyn Kenner, MD Consulting Physician Dermatology  03/12/17    Comment: sister with sebacious cell CA  Katy Apo, MD Consulting Physician Ophthalmology  03/12/17   Jovita Kussmaul, MD Consulting Physician General Surgery  03/12/17   Nicholas Lose, MD Consulting Physician Hematology and Oncology  03/12/17   Garlan Fair, MD Consulting Physician Gastroenterology  04/12/17   Gardenia Phlegm, NP Nurse Practitioner Hematology and Oncology  08/21/19      Patient Active Problem List   Diagnosis Date Noted  . Insulin resistance/ pre-DM 08/21/2019  . Low HDL (under 40) 04/04/2018  . HTN (hypertension) 03/12/2017  . Hyperlipemia, mixed 03/12/2017  . Hypothyroidism 03/12/2017  . Adjustment disorder 03/12/2017  . GERD (gastroesophageal reflux disease) 03/12/2017  . Cough variant asthma 02/23/2015  . Cancer of right breast, stage 0 09/11/2011  . Vitamin D deficiency 04/12/2017  . Osteopenia 04/12/2017  . Environmental and seasonal allergies  03/12/2017  . Family history of multiple cancers 03/12/2017  . History of tobacco abuse 03/12/2017  . Insomnia 09/11/2011  . Flank pain 09/09/2019  . Benign polyp of large intestine 08/21/2019  . Educated about COVID-19 virus infection 04/16/2019  . Chronic left hip pain 04/16/2019  . Piriformis muscle pain 04/16/2019  . Contact dermatitis 09/30/2018  . Traumatic complete tear of right rotator cuff 09/30/2018  . Chronic right shoulder pain 09/30/2018  . Muscle cramp, nocturnal 08/08/2018  . Reactive airway disease 12/17/2017  . Cataracts, bilateral- s/p surgery. 03/12/2017  . Allergic rhinitis 03/23/2015  . Arthralgia 09/11/2011    Past Medical history, Surgical history, Family history, Social history, Allergies and Medications have been entered into the medical record, reviewed and changed as needed.    Current Meds  Medication Sig  . atorvastatin (LIPITOR) 10 MG tablet Take 1 tablet (10 mg total) by mouth at bedtime.  . cyclobenzaprine (FLEXERIL) 10 MG tablet Take 1 tablet (10 mg total) by mouth 3 (three) times daily as needed for muscle spasms.  Marland Kitchen esomeprazole (NEXIUM) 20 MG capsule Take 20 mg by mouth daily at 12 noon.  Marland Kitchen ibuprofen (ADVIL) 600 MG tablet Take 1 tablet (600 mg total) by mouth every 8 (eight) hours as needed.  Marland Kitchen levocetirizine (XYZAL) 5 MG tablet Take 5 mg by mouth every evening.  Marland Kitchen levothyroxine (SYNTHROID) 100 MCG tablet 1 tablet every other day alternating with 88 mcg tablet  . levothyroxine (SYNTHROID) 88 MCG tablet *  . losartan (COZAAR) 25 MG tablet Take 1 tablet (25 mg total) by mouth daily.  . montelukast (SINGULAIR) 10 MG tablet *  . venlafaxine XR (EFFEXOR-XR) 75 MG 24 hr capsule Take 1 capsule (75 mg total) by mouth at bedtime.  . Vitamin D, Ergocalciferol, (DRISDOL) 1.25 MG (50000 UT) CAPS capsule TAKE 1 CAPSULE BY MOUTH ONCE WEEKLY  . [DISCONTINUED] atorvastatin (LIPITOR) 20 MG tablet Take 1 tablet (20 mg total) by mouth at bedtime.  . [DISCONTINUED]  losartan (COZAAR) 50 MG tablet Take 1 tablet (50 mg total) by mouth daily.  . [DISCONTINUED] omega-3 acid ethyl esters (LOVAZA) 1 g  capsule Take 2 capsules (2 g total) by mouth 2 (two) times daily.    Allergies:  Allergies  Allergen Reactions  . Advair Diskus [Fluticasone-Salmeterol]     Hoarseness.  . Neosporin [Neomycin-Polymyxin-Gramicidin] Itching     Review of Systems:  A fourteen system review of systems was performed and found to be positive as per HPI.   Objective:   Blood pressure 104/68, pulse 100, temperature (!) 97.3 F (36.3 C), temperature source Oral, resp. rate 12, height '5\' 7"'$  (1.702 m), weight 143 lb (64.9 kg), SpO2 98 %. Body mass index is 22.4 kg/m. General:  Well Developed, well nourished, appropriate for stated age.  Neuro:  Alert and oriented,  extra-ocular muscles intact  HEENT:  Normocephalic, atraumatic, neck supple, no carotid bruits appreciated  Skin:  no gross rash, warm, pink. Cardiac:  RRR, S1 S2 Respiratory:  ECTA B/L and A/P, Not using accessory muscles, speaking in full sentences- unlabored. Vascular:  Ext warm, no cyanosis apprec.; cap RF less 2 sec. Psych:  No HI/SI, judgement and insight good, Euthymic mood. Full Affect.

## 2019-11-13 ENCOUNTER — Other Ambulatory Visit: Payer: Self-pay | Admitting: Family Medicine

## 2019-11-13 DIAGNOSIS — E786 Lipoprotein deficiency: Secondary | ICD-10-CM

## 2019-11-18 ENCOUNTER — Ambulatory Visit: Payer: PPO | Admitting: Family Medicine

## 2019-11-22 ENCOUNTER — Other Ambulatory Visit: Payer: Self-pay | Admitting: Family Medicine

## 2019-11-22 DIAGNOSIS — E039 Hypothyroidism, unspecified: Secondary | ICD-10-CM

## 2019-12-19 ENCOUNTER — Other Ambulatory Visit: Payer: Self-pay

## 2019-12-19 ENCOUNTER — Other Ambulatory Visit: Payer: PPO

## 2019-12-19 DIAGNOSIS — I10 Essential (primary) hypertension: Secondary | ICD-10-CM | POA: Diagnosis not present

## 2019-12-19 DIAGNOSIS — E782 Mixed hyperlipidemia: Secondary | ICD-10-CM

## 2019-12-19 DIAGNOSIS — E8881 Metabolic syndrome: Secondary | ICD-10-CM

## 2019-12-20 LAB — LIPID PANEL
Chol/HDL Ratio: 2.9 ratio (ref 0.0–4.4)
Cholesterol, Total: 94 mg/dL — ABNORMAL LOW (ref 100–199)
HDL: 32 mg/dL — ABNORMAL LOW (ref >39)
LDL Chol Calc (NIH): 49 mg/dL (ref 0–99)
Triglycerides: 54 mg/dL (ref 0–149)
VLDL Cholesterol Cal: 13 mg/dL (ref 5–40)

## 2019-12-20 LAB — COMPREHENSIVE METABOLIC PANEL
ALT: 18 IU/L (ref 0–32)
AST: 16 IU/L (ref 0–40)
Albumin/Globulin Ratio: 1.2 (ref 1.2–2.2)
Albumin: 3.5 g/dL — ABNORMAL LOW (ref 3.7–4.7)
Alkaline Phosphatase: 109 IU/L (ref 39–117)
BUN/Creatinine Ratio: 20 (ref 12–28)
BUN: 16 mg/dL (ref 8–27)
Bilirubin Total: 0.4 mg/dL (ref 0.0–1.2)
CO2: 25 mmol/L (ref 20–29)
Calcium: 9.1 mg/dL (ref 8.7–10.3)
Chloride: 102 mmol/L (ref 96–106)
Creatinine, Ser: 0.8 mg/dL (ref 0.57–1.00)
GFR calc Af Amer: 83 mL/min/{1.73_m2} (ref >59)
GFR calc non Af Amer: 72 mL/min/{1.73_m2} (ref >59)
Globulin, Total: 3 g/dL (ref 1.5–4.5)
Glucose: 87 mg/dL (ref 65–99)
Potassium: 4.3 mmol/L (ref 3.5–5.2)
Sodium: 138 mmol/L (ref 134–144)
Total Protein: 6.5 g/dL (ref 6.0–8.5)

## 2019-12-23 ENCOUNTER — Encounter: Payer: Self-pay | Admitting: Family Medicine

## 2019-12-23 ENCOUNTER — Ambulatory Visit (INDEPENDENT_AMBULATORY_CARE_PROVIDER_SITE_OTHER): Payer: PPO | Admitting: Family Medicine

## 2019-12-23 ENCOUNTER — Other Ambulatory Visit: Payer: Self-pay

## 2019-12-23 VITALS — BP 126/70 | HR 98 | Temp 98.1°F | Ht 67.0 in | Wt 143.6 lb

## 2019-12-23 DIAGNOSIS — E782 Mixed hyperlipidemia: Secondary | ICD-10-CM | POA: Diagnosis not present

## 2019-12-23 DIAGNOSIS — E039 Hypothyroidism, unspecified: Secondary | ICD-10-CM | POA: Diagnosis not present

## 2019-12-23 DIAGNOSIS — E786 Lipoprotein deficiency: Secondary | ICD-10-CM

## 2019-12-23 DIAGNOSIS — E559 Vitamin D deficiency, unspecified: Secondary | ICD-10-CM | POA: Diagnosis not present

## 2019-12-23 DIAGNOSIS — E8881 Metabolic syndrome: Secondary | ICD-10-CM

## 2019-12-23 DIAGNOSIS — I10 Essential (primary) hypertension: Secondary | ICD-10-CM

## 2019-12-23 NOTE — Progress Notes (Signed)
Impression and Recommendations:    1. Essential hypertension   2. Hyperlipemia, mixed   3. Low HDL (under 40)   4. Insulin resistance/ pre-DM   5. Acquired hypothyroidism   6. Vitamin D deficiency     - Reviewed recent lab work (12/19/2019) in depth with patient today.  All lab work within normal limits unless otherwise noted.  Extensive education provided and all questions answered.   - Kidney function WNL last check.  To continue to maintain the health of her kidneys, encouraged patient to continue to maintain a blood pressure WNL, maintain blood sugar WNL, hydrate adequately, engage in adequate physical activity, and avoid nephrotoxic substances.  Essential Hypertension - Blood pressure currently is stable, at goal.  - Per patient, BP is well-controlled, denies lows. - Advised patient of need to avoid symptomatic low blood pressures.  Patient will continue to monitor for symptoms such as dizziness, lightheadedness, or sluggishness, especially associated with low blood pressures.  - Last OV, reduced dose of losartan to 25 mg daily.  See med list. - Reviewed goal BP of less than 130/80 on a regular basis.  Discussed that as long as she is asymptomatic, lower blood pressures within normal limits are considered healthy for a woman of her age and physical fitness.  - Patient will continue to monitor her resting heart rates and wait 10-15 minutes before checking her blood pressures and heart rate at home.  If patient's heart rates run consistently over 100, she knows to call and inform clinic for further assistance.  - Counseled patient on pathophysiology of disease and discussed various treatment options, which always includes dietary and lifestyle modification as first line.   - Lifestyle changes such as dash and heart healthy diets and engaging in a regular exercise program discussed extensively with patient.   - Ambulatory blood pressure monitoring encouraged at least  3 times weekly.  Keep log and bring in every office visit.  Reminded patient that if they ever feel poorly in any way, to check their blood pressure and pulse.  - We will continue to monitor.  Mixed Hyperlipidemia, Low HDL (under 40) - Patient started Lipitor 20 mg on 08/21/2019. - Per patient, has been taking a half-tablet, 10 mg nightly.  FLP 4 days ago: LDL = 49, down from 71 one month ago, WNL. HDL = 32, low, down from 33 one month ago. Triglycerides = 54, down from 74 one month ago.  - Continue 10 mg Lipitor, a half-tablet nightly.  - Last OV, resumed Zetia 10 mg.  See med list. - Back on 08/2018, patient was managed on 5 mg Simvastatin and 10 mg Zetia, and patient's HDL was in the high 60's.  Our goal remains to increase patient's HDL value, while lowering LDL.  - Continue Lovaza as prescribed.  See med list.  - Dietary changes such as low saturated & trans fat diets for hyperlipidemia and low carb diets for hypertriglyceridemia discussed with patient.    - To help improve HDL, encouraged patient to follow AHA guidelines for regular exercise.  - We will continue to monitor and re-check as discussed.  History of Prediabetes - A1c 5.6 last check, down from 5.9 prior. - Will continue to monitor.   Acquired Hypothyroidism - Stable last check. - Continue management as established.  See med list. - Will continue to monitor.   Vitamin D Deficiency - Stable last check. - Continue management as established.  See med list. - Will  continue to monitor.   Health Counseling & Preventative Maintenance - Advised patient to continue working toward exercising to improve overall mental, physical, and emotional health.    - Reviewed the "spokes of the wheel" of mood and health management.  Stressed the importance of ongoing prudent habits, including regular exercise, appropriate sleep hygiene, healthful dietary habits, and prayer/meditation to relax.  - Encouraged patient  to engage in daily physical activity as tolerated, especially a formal exercise routine.  Recommended that the patient eventually strive for at least 150 minutes of moderate cardiovascular activity per week according to guidelines established by the [facility].   - Healthy dietary habits encouraged, including low-carb, and high amounts of lean protein in diet.   - Patient should also consume adequate amounts of water.  - Health counseling performed.  All questions answered.   Recommendations - Follow up in 4 months for HTN, HLD, blood work as needed.   Please see AVS handed out to patient at the end of our visit for further patient instructions/ counseling done pertaining to today's office visit.   Return for f/up in 4 months for HTN, HLD, blood work at that time as needed.     Note:  This note was prepared with assistance of Dragon voice recognition software. Occasional wrong-word or sound-a-like substitutions may have occurred due to the inherent limitations of voice recognition software.   The [employer] was signed into law in 2016 which includes the topic of electronic health records.  This provides immediate access to information in MyChart.  This includes consultation notes, operative notes, office notes, lab results and pathology reports.  If you have any questions about what you read please let us know at your next visit or call us at the office.  We are right here with you.  This document serves as a record of services personally performed by Mellody Dance, DO. It was created on her behalf by [name], a trained medical scribe. The creation of this record is based on the scribe's personal observations and the provider's statements to them.    The above documentation from [name], medical scribe, has been reviewed by Marjory Sneddon,  D.O.      --------------------------------------------------------------------------------------------------------------------------------------------------------------------------------------------------------------------------------------------    Subjective:     Lindsey Price, am serving as scribe for Dr.Jancy Sprankle.   HPI: Lindsey Price is a 74 y.o. female who presents to [place] at [facility] today for issues as discussed below.  Overall, feels well.    Denies recent shoulder issues.  Notes during the appointment that when she first started taking tamoxifen in the past, she had to have a liver ultrasound.  She has had problems since then, so she's very conscious about her liver enzymes.  HPI:  Hypertension:  -  Her blood pressure at home has been running: on average, around 118/65.  She recently got a new upper arm cuff and states "I feel like it's right."  Denies lows.  - Patient reports good compliance with medication and/or lifestyle modification  - Her denies acute concerns or problems related to treatment plan  - She denies new onset of: chest pain, exercise intolerance, shortness of breath, dizziness, visual changes, headache, lower extremity swelling or claudication.   Last 3 blood pressure readings in our office are as follows: BP Readings from Last 3 Encounters:  12/23/19 126/70  11/12/19 104/68  10/22/19 110/62   Filed Weights   12/23/19 1310  Weight: 143 lb 9.6  oz (65.1 kg)    HPI:  Hyperlipidemia:  74 y.o. female here for cholesterol follow-up.   - Patient reports good compliance with treatment plan of:  medication and/ or lifestyle management.    - Patient denies any acute concerns or problems with management plan.  Tolerating Zetia well.  - She denies new onset of: myalgias, arthralgias, increased fatigue more than normal, chest pains, exercise intolerance, shortness of breath, dizziness, visual changes, headache,  lower extremity swelling or claudication.   Most recent cholesterol panel was:  Lab Results  Component Value Date   CHOL 94 (L) 12/19/2019   HDL 32 (L) 12/19/2019   LDLCALC 49 12/19/2019   TRIG 54 12/19/2019   CHOLHDL 2.9 12/19/2019   Hepatic Function Latest Ref Rng & Units 12/19/2019 11/07/2019 02/06/2019  Total Protein 6.0 - 8.5 g/dL 6.5 6.8 6.5  Albumin 3.7 - 4.7 g/dL 3.5(L) 3.5(L) 4.1  AST 0 - 40 IU/L '16 12 19  '$ ALT 0 - 32 IU/L '18 16 21  '$ Alk Phosphatase 39 - 117 IU/L 109 95 101  Total Bilirubin 0.0 - 1.2 mg/dL 0.4 0.4 0.4       Wt Readings from Last 3 Encounters:  12/23/19 143 lb 9.6 oz (65.1 kg)  11/12/19 143 lb (64.9 kg)  10/22/19 144 lb (65.3 kg)   BP Readings from Last 3 Encounters:  12/23/19 126/70  11/12/19 104/68  10/22/19 110/62   Pulse Readings from Last 3 Encounters:  12/23/19 98  11/12/19 100  10/22/19 80   BMI Readings from Last 3 Encounters:  12/23/19 22.49 kg/m  11/12/19 22.40 kg/m  10/22/19 22.55 kg/m     Patient Care Team    Relationship Specialty Notifications Start End  Mellody Dance, DO PCP - General Family Medicine  03/12/17   Juanito Doom, MD Consulting Physician Pulmonary Disease  03/12/17   Elsie Saas, MD Consulting Physician Orthopedic Surgery  03/12/17    Comment: L rot cuff repair  Allyn Kenner, MD Consulting Physician Dermatology  03/12/17    Comment: sister with sebacious cell CA  Katy Apo, MD Consulting Physician Ophthalmology  03/12/17   Jovita Kussmaul, MD Consulting Physician General Surgery  03/12/17   Nicholas Lose, MD Consulting Physician Hematology and Oncology  03/12/17   Garlan Fair, MD Consulting Physician Gastroenterology  04/12/17   Gardenia Phlegm, NP Nurse Practitioner Hematology and Oncology  08/21/19      Patient Active Problem List   Diagnosis Date Noted  . Insulin resistance/ pre-DM 08/21/2019  . Low HDL (under 40) 04/04/2018  . HTN (hypertension) 03/12/2017  . Hyperlipemia, mixed  03/12/2017  . Hypothyroidism 03/12/2017  . Adjustment disorder 03/12/2017  . GERD (gastroesophageal reflux disease) 03/12/2017  . Cough variant asthma 02/23/2015  . Cancer of right breast, stage 0 09/11/2011  . Vitamin D deficiency 04/12/2017  . Osteopenia 04/12/2017  . Environmental and seasonal allergies 03/12/2017  . Family history of multiple cancers 03/12/2017  . History of tobacco abuse 03/12/2017  . Insomnia 09/11/2011  . Flank pain 09/09/2019  . Benign polyp of large intestine 08/21/2019  . Educated about COVID-19 virus infection 04/16/2019  . Chronic left hip pain 04/16/2019  . Piriformis muscle pain 04/16/2019  . Contact dermatitis 09/30/2018  . Traumatic complete tear of right rotator cuff 09/30/2018  . Chronic right shoulder pain 09/30/2018  . Muscle cramp, nocturnal 08/08/2018  . Reactive airway disease 12/17/2017  . Cataracts, bilateral- s/p surgery. 03/12/2017  . Allergic rhinitis 03/23/2015  . Arthralgia 09/11/2011  Past Medical history, Surgical history, Family history, Social history, Allergies and Medications have been entered into the medical record, reviewed and changed as needed.    Current Meds  Medication Sig  . atorvastatin (LIPITOR) 10 MG tablet Take 1 tablet (10 mg total) by mouth at bedtime.  . cyclobenzaprine (FLEXERIL) 10 MG tablet Take 1 tablet (10 mg total) by mouth 3 (three) times daily as needed for muscle spasms.  Marland Kitchen esomeprazole (NEXIUM) 20 MG capsule Take 20 mg by mouth daily at 12 noon.  . ezetimibe (ZETIA) 10 MG tablet Take 1 tablet (10 mg total) by mouth daily.  Marland Kitchen ibuprofen (ADVIL) 600 MG tablet Take 1 tablet (600 mg total) by mouth every 8 (eight) hours as needed.  Marland Kitchen levocetirizine (XYZAL) 5 MG tablet Take 5 mg by mouth every evening.  Marland Kitchen levothyroxine (SYNTHROID) 100 MCG tablet TAKE 1 TABLET EVERY OTHER DAY ALTERNATING WITH 88 MCG TABLET  . levothyroxine (SYNTHROID) 88 MCG tablet *  . losartan (COZAAR) 25 MG tablet Take 1 tablet (25 mg  total) by mouth daily.  . montelukast (SINGULAIR) 10 MG tablet *  . omega-3 acid ethyl esters (LOVAZA) 1 g capsule TAKE 2 CAPSULES (2 G TOTAL) BY MOUTH 2 (TWO) TIMES DAILY.  Marland Kitchen venlafaxine XR (EFFEXOR-XR) 75 MG 24 hr capsule Take 1 capsule (75 mg total) by mouth at bedtime.  . Vitamin D, Ergocalciferol, (DRISDOL) 1.25 MG (50000 UT) CAPS capsule TAKE 1 CAPSULE BY MOUTH ONCE WEEKLY    Allergies:  Allergies  Allergen Reactions  . Advair Diskus [Fluticasone-Salmeterol]     Hoarseness.  . Neosporin [Neomycin-Polymyxin-Gramicidin] Itching     Review of Systems:  A fourteen system review of systems was performed and found to be positive as per HPI.   Objective:   Blood pressure 126/70, pulse 98, temperature 98.1 F (36.7 C), temperature source Oral, height '5\' 7"'$  (1.702 m), weight 143 lb 9.6 oz (65.1 kg), SpO2 98 %. Body mass index is 22.49 kg/m. General:  Well Developed, well nourished, appropriate for stated age.  Neuro:  Alert and oriented,  extra-ocular muscles intact  HEENT:  Normocephalic, atraumatic, neck supple, no carotid bruits appreciated  Skin:  no gross rash, warm, pink. Cardiac:  RRR, S1 S2 Respiratory:  ECTA B/L and A/P, Not using accessory muscles, speaking in full sentences- unlabored. Vascular:  Ext warm, no cyanosis apprec.; cap RF less 2 sec. Psych:  No HI/SI, judgement and insight good, Euthymic mood. Full Affect.

## 2020-02-03 ENCOUNTER — Other Ambulatory Visit: Payer: Self-pay | Admitting: Family Medicine

## 2020-02-03 DIAGNOSIS — E782 Mixed hyperlipidemia: Secondary | ICD-10-CM

## 2020-02-03 DIAGNOSIS — E786 Lipoprotein deficiency: Secondary | ICD-10-CM

## 2020-02-03 DIAGNOSIS — I10 Essential (primary) hypertension: Secondary | ICD-10-CM

## 2020-02-06 DIAGNOSIS — E039 Hypothyroidism, unspecified: Secondary | ICD-10-CM | POA: Diagnosis not present

## 2020-02-06 DIAGNOSIS — E559 Vitamin D deficiency, unspecified: Secondary | ICD-10-CM | POA: Diagnosis not present

## 2020-02-06 DIAGNOSIS — E7800 Pure hypercholesterolemia, unspecified: Secondary | ICD-10-CM | POA: Diagnosis not present

## 2020-02-06 DIAGNOSIS — R7303 Prediabetes: Secondary | ICD-10-CM | POA: Diagnosis not present

## 2020-02-06 DIAGNOSIS — Z79899 Other long term (current) drug therapy: Secondary | ICD-10-CM | POA: Diagnosis not present

## 2020-02-11 DIAGNOSIS — E039 Hypothyroidism, unspecified: Secondary | ICD-10-CM | POA: Diagnosis not present

## 2020-02-11 DIAGNOSIS — R7303 Prediabetes: Secondary | ICD-10-CM | POA: Diagnosis not present

## 2020-02-11 DIAGNOSIS — E559 Vitamin D deficiency, unspecified: Secondary | ICD-10-CM | POA: Diagnosis not present

## 2020-02-11 DIAGNOSIS — Z6822 Body mass index (BMI) 22.0-22.9, adult: Secondary | ICD-10-CM | POA: Diagnosis not present

## 2020-02-11 DIAGNOSIS — K219 Gastro-esophageal reflux disease without esophagitis: Secondary | ICD-10-CM | POA: Diagnosis not present

## 2020-02-11 DIAGNOSIS — E7800 Pure hypercholesterolemia, unspecified: Secondary | ICD-10-CM | POA: Diagnosis not present

## 2020-02-11 DIAGNOSIS — I10 Essential (primary) hypertension: Secondary | ICD-10-CM | POA: Diagnosis not present

## 2020-03-24 ENCOUNTER — Telehealth: Payer: Self-pay | Admitting: Adult Health

## 2020-03-24 NOTE — Telephone Encounter (Signed)
Rescheduled appointment per 7/21 message. Patient is aware of updated appointment date and time.

## 2020-04-21 ENCOUNTER — Ambulatory Visit: Payer: PPO | Admitting: Physician Assistant

## 2020-05-06 NOTE — Progress Notes (Signed)
@Patient  ID: Lindsey Price, female    DOB: [birth year], 74 y.o.   MRN: [id number]  Chief Complaint  Patient presents with  . Follow-up    Pt states she has been doing well since last visit. Pt said she has been told that she coughs but doesn't notice it. Pt will use the Qvar as needed.    Referring provider: Lorrene Reid, PA-C  HPI:  74 year old female former smoker followed in our office for cough variant asthma  PMH: Hypertension, hyperlipidemia, hypothyroidism, GERD, insomnia Smoker/ Smoking History: Former smoker.  Quit 1980.  15-pack-year smoking history. Maintenance: Qvar 80 Pt of: Dr. Lake Bells  05/07/2020  - Visit   74 year old female former smoker followed in our office for cough variant asthma. She was last seen in our office in October/2020. Former patient of Dr. Lake Bells. She was seen by [facility] NP at that time. She was instructed to remain on Qvar. Patient presenting to office today for follow-up of cough. She reports that she still has an occasional cough per her husband. She does not notice it often. She needs refills of her Qvar. She has received her COVID-19 vaccines.   She remains adherent to daily Nexium, Xyzal, Singulair.  Patient reports that she continues to have ongoing battles of chronic hoarseness.  She reports that previously before establishing care with Dr. Lake Bells she was seen by an ear nose and throat specialist where they directly visualized her vocal cords and she was diagnosed with vocal cord atrophy.  It was felt that inhaled corticosteroids could also exacerbate her hoarseness.  This is why she has been  struggling with maintenance Qvar use.  We will discuss and evaluate this today.  Cough ROS:   Daily antihistamine: xyzal  GERD treatment: nexium  Singulair: yes  Cough checklist (bolded indicates presence):  Adherence, acid reflux, ACE inhibitor, active sinus disease, active smoking, adverse effects of medications (amiodarone/Macrodantin/bb), alpha  1, allergies, aspiration, anxiety, bronchiectasis, congestive heart failure (diastolic)   Tests:   FENO:  Lab Results  Component Value Date   NITRICOXIDE 96 05/16/2017    PFT: No flowsheet data found.  WALK:  No flowsheet data found.  Imaging: No results found.  Lab Results:  CBC    Component Value Date/Time   WBC 5.0 02/06/2019 0908   WBC 4.1 05/15/2014 0908   RBC 4.56 02/06/2019 0908   RBC 4.70 05/15/2014 0908   HGB 13.7 02/06/2019 0908   HGB 14.6 05/15/2014 0908   HCT 41.3 02/06/2019 0908   HCT 44.9 05/15/2014 0908   PLT 322 02/06/2019 0908   MCV 91 02/06/2019 0908   MCV 95.6 05/15/2014 0908   MCH 30.0 02/06/2019 0908   MCH 31.1 05/15/2014 0908   MCH 32.5 09/28/2010 1145   MCHC 33.2 02/06/2019 0908   MCHC 32.5 05/15/2014 0908   RDW 13.1 02/06/2019 0908   RDW 12.7 05/15/2014 0908   LYMPHSABS 1.3 02/06/2019 0908   LYMPHSABS 1.5 05/15/2014 0908   MONOABS 0.5 05/15/2014 0908   EOSABS 0.2 02/06/2019 0908   EOSABS 0.1 05/27/2010 1030   BASOSABS 0.0 02/06/2019 0908   BASOSABS 0.0 05/15/2014 0908    BMET    Component Value Date/Time   NA 138 12/19/2019 0903   NA 143 05/15/2014 0908   K 4.3 12/19/2019 0903   K 4.3 05/15/2014 0908   CL 102 12/19/2019 0903   CL 106 10/31/2012 0901   CO2 25 12/19/2019 0903   CO2 25 05/15/2014 0908   GLUCOSE 87 12/19/2019  4315   GLUCOSE 101 05/15/2014 0908   GLUCOSE 96 10/31/2012 0901   BUN 16 12/19/2019 0903   BUN 19.1 05/15/2014 0908   CREATININE 0.80 12/19/2019 0903   CREATININE 0.8 05/15/2014 0908   CALCIUM 9.1 12/19/2019 0903   CALCIUM 9.2 05/15/2014 0908   GFRNONAA 72 12/19/2019 0903   GFRAA 83 12/19/2019 0903    BNP No results found for: BNP  ProBNP No results found for: PROBNP  Specialty Problems      Pulmonary Problems   Cough variant asthma    Sees Pulm- Dr Lake Bells- for her asthma Takes Qvar regularly, zyrtec, singulair, and albuterol rescue occ  Simple spirometry June 2016 without significant  airflow obstruction Exhaled nitric oxide 58 ppm 02/10/2015 01/2015 CXR read as normal      Allergic rhinitis   Reactive airway disease      Allergies  Allergen Reactions  . Advair Diskus [Fluticasone-Salmeterol]     Hoarseness.  . Neosporin [Neomycin-Polymyxin-Gramicidin] Itching    Immunization History  Administered Date(s) Administered  . Hepatitis A 06/16/2016  . Hepatitis A, Adult 06/05/2017  . Influenza Split 11/23/2014, 06/05/2015, 05/27/2018  . Influenza, High Dose Seasonal PF 05/13/2017, 05/27/2018, 05/27/2019  . Influenza-Unspecified 05/13/2017  . PFIZER SARS-COV-2 Vaccination 09/20/2019, 10/11/2019  . Pneumococcal Conjugate-13 05/27/2014  . Pneumococcal Polysaccharide-23 11/16/2016  . Tdap 03/05/2012  . Zoster Recombinat (Shingrix) 09/15/2018, 02/01/2019    Past Medical History:  Diagnosis Date  . Allergy   . Arthralgia 09/11/2011  . Asthma   . Breast cancer ([place])   . 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 Hyperlipidemia   . Hypertension   . Insomnia   . Thyroid disease    hypothyroidism    Tobacco History: Social History   Tobacco Use  Smoking Status Former Smoker  . Packs/day: 1.00  . Years: 15.00  . Pack years: 15.00  . Types: Cigarettes  . Quit date: 12/09/1978  . Years since quitting: 41.4  Smokeless Tobacco Never Used   Counseling given: Yes   Continue to not smoke  Outpatient Encounter Medications as of 05/07/2020  Medication Sig  . albuterol (PROAIR HFA) 108 (90 Base) MCG/ACT inhaler Inhale 1-2 puffs into the lungs every 6 (six) hours as needed for wheezing or shortness of breath.  Marland Kitchen atorvastatin (LIPITOR) 10 MG tablet Take 1 tablet (10 mg total) by mouth at bedtime.  Marland Kitchen esomeprazole (NEXIUM) 20 MG capsule Take 20 mg by mouth daily at 12 noon.  . ezetimibe (ZETIA) 10 MG tablet Take 1 tablet (10 mg total) by mouth daily.  Marland Kitchen ibuprofen (ADVIL) 600 MG tablet Take 1 tablet (600 mg total) by mouth every 8 (eight) hours as needed.   Marland Kitchen levocetirizine (XYZAL) 5 MG tablet Take 5 mg by mouth every evening.  Marland Kitchen levothyroxine (SYNTHROID) 100 MCG tablet TAKE 1 TABLET EVERY OTHER DAY ALTERNATING WITH 88 MCG TABLET  . levothyroxine (SYNTHROID) 88 MCG tablet *  . losartan (COZAAR) 25 MG tablet Take 1 tablet (25 mg total) by mouth daily.  . montelukast (SINGULAIR) 10 MG tablet *  . omega-3 acid ethyl esters (LOVAZA) 1 g capsule TAKE 2 CAPSULES (2 G TOTAL) BY MOUTH 2 (TWO) TIMES DAILY.  Marland Kitchen venlafaxine XR (EFFEXOR-XR) 75 MG 24 hr capsule Take 1 capsule (75 mg total) by mouth at bedtime.  . Vitamin D, Ergocalciferol, (DRISDOL) 1.25 MG (50000 UT) CAPS capsule TAKE 1 CAPSULE BY MOUTH ONCE WEEKLY  . [DISCONTINUED] albuterol (PROAIR HFA) 108 (90 Base) MCG/ACT inhaler Inhale  1-2 puffs into the lungs every 6 (six) hours as needed for wheezing or shortness of breath.  . [DISCONTINUED] beclomethasone (QVAR) 80 MCG/ACT inhaler Inhale 2 puffs into the lungs 2 (two) times daily.  . beclomethasone (QVAR REDIHALER) 40 MCG/ACT inhaler Inhale 1 puff into the lungs 2 (two) times daily.  . fluticasone (FLONASE) 50 MCG/ACT nasal spray Place 1 spray into both nostrils daily.  . [DISCONTINUED] cyclobenzaprine (FLEXERIL) 10 MG tablet Take 1 tablet (10 mg total) by mouth 3 (three) times daily as needed for muscle spasms.   No facility-administered encounter medications on file as of 05/07/2020.     Review of Systems  Review of Systems  Constitutional: Negative for activity change, fatigue and fever.  HENT: Positive for postnasal drip and rhinorrhea. Negative for sinus pressure, sinus pain and sore throat.   Respiratory: Positive for cough. Negative for shortness of breath and wheezing.   Cardiovascular: Negative for chest pain and palpitations.  Gastrointestinal: Negative for diarrhea, nausea and vomiting.  Musculoskeletal: Negative for arthralgias.  Neurological: Negative for dizziness.  Psychiatric/Behavioral: Negative for sleep disturbance. The  patient is not nervous/anxious.      Physical Exam  BP 124/60 (BP Location: Left Arm, Cuff Size: Normal)   Pulse 80   Ht 5\' 7"  (1.702 m)   Wt 142 lb 3.2 oz (64.5 kg)   SpO2 99%   BMI 22.27 kg/m   Wt Readings from Last 5 Encounters:  05/07/20 142 lb 3.2 oz (64.5 kg)  12/23/19 143 lb 9.6 oz (65.1 kg)  11/12/19 143 lb (64.9 kg)  10/22/19 144 lb (65.3 kg)  10/08/19 144 lb (65.3 kg)    BMI Readings from Last 5 Encounters:  05/07/20 22.27 kg/m  12/23/19 22.49 kg/m  11/12/19 22.40 kg/m  10/22/19 22.55 kg/m  10/08/19 22.55 kg/m     Physical Exam Vitals and nursing note reviewed.  Constitutional:      General: She is not in acute distress.    Appearance: Normal appearance. She is normal weight.  HENT:     Head: Normocephalic and atraumatic.     Right Ear: Tympanic membrane, ear canal and external ear normal. There is no impacted cerumen.     Left Ear: Tympanic membrane, ear canal and external ear normal. There is no impacted cerumen.     Nose: Rhinorrhea present. No congestion.     Mouth/Throat:     Mouth: Mucous membranes are moist.     Pharynx: Oropharynx is clear.     Comments: Post nasal drip Eyes:     Pupils: Pupils are equal, round, and reactive to light.  Cardiovascular:     Rate and Rhythm: Normal rate and regular rhythm.     Pulses: Normal pulses.     Heart sounds: Normal heart sounds. No murmur heard.   Pulmonary:     Effort: Pulmonary effort is normal. No respiratory distress.     Breath sounds: Normal breath sounds. No decreased air movement. No decreased breath sounds, wheezing or rales.  Musculoskeletal:     Cervical back: Normal range of motion.  Skin:    General: Skin is warm and dry.     Capillary Refill: Capillary refill takes less than 2 seconds.  Neurological:     General: No focal deficit present.     Mental Status: She is alert and oriented to person, place, and time. Mental status is at baseline.     Gait: Gait normal.  Psychiatric:         Mood  and Affect: Mood normal.        Behavior: Behavior normal.        Thought Content: Thought content normal.        Judgment: Judgment normal.       Assessment & Plan:   Cough variant asthma Plan: Stop Qvar 80 Start Qvar 40 Chest x-ray today Can start nasal saline rinses Can start Flonase Continue Xyzal Continue Singulair Continue Nexium We will have patient establish with new pulmonologist in our office in 3 months  Allergic rhinitis Plan: continue Singulair Continue Xyzal Start Flonase Can start nasal saline rinses Establish care in 3 months with new pulmonologist   Hoarseness, chronic Per patient previously evaluated by ear nose and throat prior to establishing with our clinic and diagnosed with vocal cord atrophy  Plan: We will decrease ICS to Qvar 40 We will clinically monitor, if symptoms persist may need to consider referral back to ear nose and throat for evaluation or second opinion  GERD (gastroesophageal reflux disease) Plan: Continue Nexium Reviewed GERD lifestyle changes Emphasized importance of taking PPI in the morning 15 to 30 minutes before first meal the day    Return in about 3 months (around 08/06/2020), or if symptoms worsen or fail to improve, for NEW PULMONOLOGIST IN 79min SLOT.   Lauraine Rinne, NP 05/07/2020   This appointment required 32 minutes of patient care (this includes precharting, chart review, review of results, face-to-face care, etc.).

## 2020-05-07 ENCOUNTER — Other Ambulatory Visit: Payer: Self-pay

## 2020-05-07 ENCOUNTER — Telehealth: Payer: Self-pay | Admitting: Pulmonary Disease

## 2020-05-07 ENCOUNTER — Encounter: Payer: Self-pay | Admitting: Pulmonary Disease

## 2020-05-07 ENCOUNTER — Ambulatory Visit: Payer: PPO | Admitting: Pulmonary Disease

## 2020-05-07 ENCOUNTER — Ambulatory Visit (INDEPENDENT_AMBULATORY_CARE_PROVIDER_SITE_OTHER): Payer: PPO

## 2020-05-07 VITALS — BP 124/60 | HR 80 | Ht 67.0 in | Wt 142.2 lb

## 2020-05-07 DIAGNOSIS — J309 Allergic rhinitis, unspecified: Secondary | ICD-10-CM

## 2020-05-07 DIAGNOSIS — K219 Gastro-esophageal reflux disease without esophagitis: Secondary | ICD-10-CM | POA: Diagnosis not present

## 2020-05-07 DIAGNOSIS — R05 Cough: Secondary | ICD-10-CM | POA: Diagnosis not present

## 2020-05-07 DIAGNOSIS — R490 Dysphonia: Secondary | ICD-10-CM | POA: Insufficient documentation

## 2020-05-07 DIAGNOSIS — J45991 Cough variant asthma: Secondary | ICD-10-CM

## 2020-05-07 MED ORDER — ALBUTEROL SULFATE HFA 108 (90 BASE) MCG/ACT IN AERS: 1.0000 | INHALATION_SPRAY | Freq: Four times a day (QID) | RESPIRATORY_TRACT | 5 refills | Status: DC | PRN

## 2020-05-07 MED ORDER — FLUTICASONE PROPIONATE 50 MCG/ACT NA SUSP: 1.0000 | Freq: Every day | NASAL | 3 refills | Status: DC

## 2020-05-07 MED ORDER — QVAR REDIHALER 40 MCG/ACT IN AERB: 1.0000 | INHALATION_SPRAY | Freq: Two times a day (BID) | RESPIRATORY_TRACT | 5 refills | Status: DC

## 2020-05-07 NOTE — Progress Notes (Signed)
Reviewed and agree with assessment/plan.   Chesley Mires, MD [facility] Pulmonary/Critical Care 05/07/2020, 12:32 PM Pager:  [phone]

## 2020-05-07 NOTE — Assessment & Plan Note (Signed)
Plan: continue Singulair Continue Xyzal Start Flonase Can start nasal saline rinses Establish care in 3 months with new pulmonologist

## 2020-05-07 NOTE — Assessment & Plan Note (Signed)
Plan: Continue Nexium Reviewed GERD lifestyle changes Emphasized importance of taking PPI in the morning 15 to 30 minutes before first meal the day

## 2020-05-07 NOTE — Assessment & Plan Note (Signed)
Per patient previously evaluated by ear nose and throat prior to establishing with our clinic and diagnosed with vocal cord atrophy  Plan: We will decrease ICS to Qvar 40 We will clinically monitor, if symptoms persist may need to consider referral back to ear nose and throat for evaluation or second opinion

## 2020-05-07 NOTE — Assessment & Plan Note (Signed)
Plan: Stop Qvar 80 Start Qvar 40 Chest x-ray today Can start nasal saline rinses Can start Flonase Continue Xyzal Continue Singulair Continue Nexium We will have patient establish with new pulmonologist in our office in 3 months

## 2020-05-07 NOTE — Patient Instructions (Addendum)
You were seen today by Lauraine Rinne, NP  for:   1. Cough variant asthma  - DG Chest 2 View; Future  Stop Qvar 80  Start Qvar 40 1 puff every 12 hours, rinse mouth out after use, gargle after use Okay to increase to 2 puffs every 12 hours, rinse mouth out after use, gargle after use if symptoms are improving and hoarseness is not worsened Take this as prescribed every day  Only use your albuterol as a rescue medication to be used if you can't catch your breath by resting or doing a relaxed purse lip breathing pattern.  - The less you use it, the better it will work when you need it. - Ok to use up to 2 puffs  every 4 hours if you must but call for immediate appointment if use goes up over your usual need - Don't leave home without it !!  (think of it like the spare tire for your car)    2. Gastroesophageal reflux disease, unspecified whether esophagitis present  Nexium 20 mg tablet  >>>Please take 1 tablet daily 15 minutes to 30 minutes before your first meal of the day as well as before your other medications >>>Try to take at the same time each day >>>take this medication daily  GERD management: >>>Avoid laying flat until 2 hours after meals >>>Elevate head of the bed including entire chest >>>Reduce size of meals and amount of fat, acid, spices, caffeine and sweets >>>If you are smoking, Please stop! >>>Decrease alcohol consumption >>>Work on maintaining a healthy weight with normal BMI     3. Allergic rhinitis, unspecified seasonality, unspecified trigger  - fluticasone (FLONASE) 50 MCG/ACT nasal spray; Place 1 spray into both nostrils daily.  Dispense: 16 g; Refill: 3  Continue Xyzal daily  Continue Singulair  Start nasal saline rinses daily, okay to increase to 2 times a day Use distilled water Shake well Get bottle lukewarm like a baby bottle  Can start fluticasone/Flonase 1 spray each nostril daily as needed for allergy symptoms, use this after the nasal saline  rinse  4. Hoarseness, chronic  We will start her on a lower dose of Qvar today to help with management of cough as well as to hopefully not exacerbate her voice hoarseness  If hoarseness worsens please let us know  If this continues to persist may need to consider pulmonary function testing or reevaluation to ear nose and throat   We recommend today:  Orders Placed This Encounter  Procedures  . DG Chest 2 View    Standing Status:   Future    Standing Expiration Date:   09/06/2020    Order Specific Question:   Reason for Exam (SYMPTOM  OR DIAGNOSIS REQUIRED)    Answer:   cough, former smoker    Order Specific Question:   Preferred imaging location?    Answer:   Internal    Order Specific Question:   Radiology Contrast Protocol - do NOT remove file path    Answer:   [email or web address]   Orders Placed This Encounter  Procedures  . DG Chest 2 View   Meds ordered this encounter  Medications  . fluticasone (FLONASE) 50 MCG/ACT nasal spray    Sig: Place 1 spray into both nostrils daily.    Dispense:  16 g    Refill:  3    Follow Up:    Return in about 3 months (around 08/06/2020), or if symptoms worsen or fail to improve, for NEW PULMONOLOGIST  IN 9min SLOT.  Would favor Dr. Silas Flood, Dr. Erin Fulling or Dr. Shearon Stalls given patient's clinical history, establish with one of them in 30-minute time saw and 2 to 3 months, former BQ patient     Notification of test results are managed in the following manner: If there are  any recommendations or changes to the  plan of care discussed in office today,  we will contact you and let you know what they are. If you do not hear from [place], then your results are normal and you can view them through your  MyChart account , or a letter will be sent to you. Thank you again for trusting [place] with your care  - Thank you, [facility]    It is flu season:   >>> Best ways to protect herself from the flu:  Receive the yearly flu vaccine, practice good hand hygiene washing with soap and also using hand sanitizer when available, eat a nutritious meals, get adequate rest, hydrate appropriately       Please contact the office if your symptoms worsen or you have concerns that you are not improving.   Thank you for choosing [facility] for your healthcare, and for allowing [place] to partner with you on your healthcare journey. I am thankful to be able to provide care to you today.   Wyn Quaker FNP-C

## 2020-05-07 NOTE — Telephone Encounter (Signed)
Called and spoke with patient, advised that the x-ray had not been read yet, but after it is read, one of [place] will give her a call.  She verbalized understanding.  Nothing further needed.

## 2020-05-17 DIAGNOSIS — H524 Presbyopia: Secondary | ICD-10-CM | POA: Diagnosis not present

## 2020-05-17 DIAGNOSIS — Z961 Presence of intraocular lens: Secondary | ICD-10-CM | POA: Diagnosis not present

## 2020-05-17 DIAGNOSIS — H52203 Unspecified astigmatism, bilateral: Secondary | ICD-10-CM | POA: Diagnosis not present

## 2020-05-26 ENCOUNTER — Other Ambulatory Visit: Payer: Self-pay | Admitting: Pulmonary Disease

## 2020-05-26 ENCOUNTER — Telehealth: Payer: Self-pay | Admitting: Pulmonary Disease

## 2020-05-26 MED ORDER — FLOVENT HFA 44 MCG/ACT IN AERO: 2.0000 | INHALATION_SPRAY | Freq: Two times a day (BID) | RESPIRATORY_TRACT | 3 refills | Status: DC

## 2020-05-26 NOTE — Telephone Encounter (Signed)
Spoke with the pt and notified of response per [name] and she verbalized understanding  Rx for Qvar was sent  Nothing further needed

## 2020-05-26 NOTE — Telephone Encounter (Signed)
Spoke with the pt  She was seen by [name] on 05/07/20  Qvar decreased from 80 to 40 mcg  She states her insurance advised her that the Qvar is not covered but Flovent is  Is this appropriate for her? She is asking for rx  She has appt to est care with Dr Erin Fulling on 08/03/20  Please advise, thanks!

## 2020-05-26 NOTE — Telephone Encounter (Signed)
05/26/2020  Yes this would be appropriate.  Patient can stop Qvar.  Patient can start:  Flovent HFA 44 mcg >>> 2 puffs in the morning right when you wake up, rinse out your mouth after use, 12 hours later 2 puffs, rinse after use >>> in two weeks okay to increase to 4 puffs every 12 hours if symptoms are improving and hoarseness is worsening >>> Take this daily, no matter what >>> This is not a rescue inhaler   Please send in refills: 3  Patient can keep follow-up with Dr. Erin Fulling to establish care.  Wyn Quaker, FNP

## 2020-06-02 ENCOUNTER — Other Ambulatory Visit: Payer: Self-pay | Admitting: Family Medicine

## 2020-06-08 ENCOUNTER — Other Ambulatory Visit: Payer: Self-pay | Admitting: Adult Health

## 2020-06-08 ENCOUNTER — Encounter: Payer: PPO | Admitting: Adult Health

## 2020-06-08 DIAGNOSIS — R232 Flushing: Secondary | ICD-10-CM

## 2020-06-09 ENCOUNTER — Encounter: Payer: Self-pay | Admitting: Adult Health

## 2020-06-09 ENCOUNTER — Inpatient Hospital Stay: Payer: PPO | Attending: Adult Health | Admitting: Adult Health

## 2020-06-09 ENCOUNTER — Other Ambulatory Visit: Payer: Self-pay

## 2020-06-09 VITALS — BP 115/58 | HR 81 | Temp 98.3°F | Resp 18 | Ht 67.0 in | Wt 139.2 lb

## 2020-06-09 DIAGNOSIS — Z87891 Personal history of nicotine dependence: Secondary | ICD-10-CM | POA: Diagnosis not present

## 2020-06-09 DIAGNOSIS — M85852 Other specified disorders of bone density and structure, left thigh: Secondary | ICD-10-CM | POA: Insufficient documentation

## 2020-06-09 DIAGNOSIS — Z09 Encounter for follow-up examination after completed treatment for conditions other than malignant neoplasm: Secondary | ICD-10-CM | POA: Insufficient documentation

## 2020-06-09 DIAGNOSIS — Z86000 Personal history of in-situ neoplasm of breast: Secondary | ICD-10-CM | POA: Diagnosis not present

## 2020-06-09 DIAGNOSIS — D0591 Unspecified type of carcinoma in situ of right breast: Secondary | ICD-10-CM

## 2020-06-09 NOTE — Progress Notes (Signed)
CLINIC:  Survivorship   REASON FOR VISIT:  Routine follow-up for history of breast cancer.   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  Overall, she reports feeling quite well.   Her most recent mammogram was completed on 09/26/2019 and showed no evidence of malignancy and breast density category B.  She underwent bone density testing on 10/31/2019 that showed mild osteopenia in the spine and left femur with a T score of -1.3.    Lindsey Price feels great and has had a good year.  She denies any new issues today.  She is exercising regularly and is up to date with her cancer screenings.    REVIEW OF SYSTEMS:  Review of Systems  Constitutional: Negative for appetite change, chills, fatigue, fever and unexpected weight change.  HENT:   Negative for hearing loss, lump/mass and trouble swallowing.   Eyes: Negative for eye problems and icterus.  Respiratory: Negative for chest tightness, cough and shortness of breath.   Cardiovascular: Negative for chest pain, leg swelling and palpitations.  Gastrointestinal: Negative for abdominal distention, abdominal pain, constipation, diarrhea, nausea and vomiting.  Endocrine: Negative for hot flashes.  Skin: Negative for itching and rash.  Neurological: Negative for dizziness, extremity weakness, headaches and numbness.  Hematological: Negative for adenopathy. Does not bruise/bleed easily.  Psychiatric/Behavioral: Negative for depression. The patient is  not nervous/anxious.   Breast: Denies any new nodularity, masses, tenderness, nipple changes, or nipple discharge.       PAST MEDICAL/SURGICAL HISTORY:  Past Medical History:  Diagnosis Date  . Allergy   . Arthralgia 09/11/2011  . Asthma   . Breast cancer ([place])   . 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 Hyperlipidemia   . Hypertension   . Insomnia   . Thyroid disease    hypothyroidism   Past Surgical History:  Procedure Laterality Date  . ABDOMINAL HYSTERECTOMY  1975   partial  . BREAST LUMPECTOMY Right 2011  . CATARACT EXTRACTION, BILATERAL    . COLONOSCOPY  2015  . COLONOSCOPY WITH PROPOFOL N/A 04/14/2014   Procedure: COLONOSCOPY WITH PROPOFOL;  Surgeon: Garlan Fair, MD;  Location: [facility] ENDOSCOPY;  Service: Endoscopy;  Laterality: N/A;  . POLYPECTOMY    . ROTATOR CUFF REPAIR Left 04/2013   Dr. Para March     ALLERGIES:  Allergies  Allergen Reactions  . Advair Diskus [Fluticasone-Salmeterol]     Hoarseness.  . Neosporin [Neomycin-Polymyxin-Gramicidin] Itching     CURRENT MEDICATIONS:  Outpatient Encounter Medications as of 06/09/2020  Medication Sig  . albuterol (PROAIR HFA) 108 (90 Base) MCG/ACT inhaler Inhale 1-2 puffs into the lungs every 6 (six) hours as needed for wheezing or shortness of breath.  Marland Kitchen atorvastatin (LIPITOR) 10 MG tablet Take 1 tablet (10 mg total) by mouth at bedtime.  Marland Kitchen esomeprazole (NEXIUM) 20 MG capsule Take 20 mg by mouth daily at 12 noon.  . ezetimibe (ZETIA) 10 MG tablet Take 1 tablet (10 mg total) by  mouth daily.  . fluticasone (FLONASE) 50 MCG/ACT nasal spray Place 1 spray into both nostrils daily.  . fluticasone (FLOVENT HFA) 44 MCG/ACT inhaler Inhale 2 puffs into the lungs every 12 (twelve) hours.  Marland Kitchen ibuprofen (ADVIL) 600 MG tablet Take 1 tablet (600 mg total) by mouth every 8 (eight) hours as needed.  Marland Kitchen levocetirizine (XYZAL) 5 MG tablet Take 5 mg by mouth every evening.  Marland Kitchen levothyroxine (SYNTHROID) 100 MCG tablet TAKE 1  TABLET EVERY OTHER DAY ALTERNATING WITH 88 MCG TABLET  . levothyroxine (SYNTHROID) 88 MCG tablet *  . losartan (COZAAR) 25 MG tablet Take 1 tablet (25 mg total) by mouth daily.  . montelukast (SINGULAIR) 10 MG tablet *  . omega-3 acid ethyl esters (LOVAZA) 1 g capsule TAKE 2 CAPSULES (2 G TOTAL) BY MOUTH 2 (TWO) TIMES DAILY.  Marland Kitchen venlafaxine XR (EFFEXOR-XR) 75 MG 24 hr capsule Take 1 capsule (75 mg total) by mouth at bedtime.  . Vitamin D, Ergocalciferol, (DRISDOL) 1.25 MG (50000 UT) CAPS capsule TAKE 1 CAPSULE BY MOUTH ONCE WEEKLY   No facility-administered encounter medications on file as of 06/09/2020.     ONCOLOGIC FAMILY HISTORY:  Family History  Problem Relation Age of Onset  . Cancer Mother        BREAST  . Cancer Father        pancreatic  . Cancer Sister        sebacous cell carcinoma  . Cancer Maternal Aunt        lung  . Cancer Paternal Uncle        colon, stomach  . Colon cancer Neg Hx   . Colon polyps Neg Hx   . Esophageal cancer Neg Hx   . Rectal cancer Neg Hx   . Stomach cancer Neg Hx     SOCIAL HISTORY:  Social History   Socioeconomic History  . Marital status: Married    Spouse name: Not on file  . Number of children: Not on file  . Years of education: Not on file  . Highest education level: Not on file  Occupational History  . Not on file  Tobacco Use  . Smoking status: Former Smoker    Packs/day: 1.00    Years: 15.00    Pack years: 15.00    Types: Cigarettes    Quit date: 12/09/1978    Years since quitting: 41.5  . Smokeless tobacco: Never Used  Vaping Use  . Vaping Use: Never used  Substance and Sexual Activity  . Alcohol use: Yes    Alcohol/week: 7.0 standard drinks    Types: 7 Glasses of wine per week    Comment: SOCIALLY wine  . Drug use: No  . Sexual activity: Yes  Other Topics Concern  . Not on file  Social History Narrative  . Not on file   Social Determinants of Health   Financial Resource Strain:   . Difficulty of Paying  Living Expenses: Not on file  Food Insecurity:   . Worried About Charity fundraiser in the Last Year: Not on file  . Ran Out of Food in the Last Year: Not on file  Transportation Needs:   . Lack of Transportation (Medical): Not on file  . Lack of Transportation (Non-Medical): Not on file  Physical Activity:   . Days of Exercise per Week: Not on file  . Minutes of Exercise per Session: Not on file  Stress:   . Feeling of Stress : Not on file  Social  Connections:   . Frequency of Communication with Friends and Family: Not on file  . Frequency of Social Gatherings with Friends and Family: Not on file  . Attends Religious Services: Not on file  . Active Member of Clubs or Organizations: Not on file  . Attends Archivist Meetings: Not on file  . Marital Status: Not on file  Intimate Partner Violence:   . Fear of Current or Ex-Partner: Not on file  . Emotionally Abused: Not on file  . Physically Abused: Not on file  . Sexually Abused: Not on file      PHYSICAL EXAMINATION:  Vital Signs: Vitals:   06/09/20 1118  BP: (!) 115/58  Pulse: 81  Resp: 18  Temp: 98.3 F (36.8 C)  SpO2: 98%   Filed Weights   06/09/20 1118  Weight: 139 lb 3.2 oz (63.1 kg)   General: Well-nourished, well-appearing female in no acute distress.  Unaccompanied today.   HEENT: Head is normocephalic.  Pupils equal and reactive to light. Conjunctivae clear without exudate.  Sclerae anicteric. Oral mucosa is pink, moist.  Oropharynx is pink without lesions or erythema.  Lymph: No cervical, supraclavicular, or infraclavicular lymphadenopathy noted on palpation.  Cardiovascular: Regular rate and rhythm.Marland Kitchen Respiratory: Clear to auscultation bilaterally. Chest expansion symmetric; breathing non-labored.  Breast Exam:  -Left breast: No appreciable masses on palpation. No skin redness, thickening, or peau d'orange appearance; no nipple retraction or nipple discharge;   -Right breast: No appreciable  masses on palpation. No skin redness, thickening, or peau d'orange appearance; no nipple retraction or nipple discharge; mild distortion in symmetry at previous lumpectomy site well healed scar without erythema or nodularity. -Axilla: No axillary adenopathy bilaterally.  GI: Abdomen soft and round; non-tender, non-distended. Bowel sounds normoactive. No hepatosplenomegaly.   GU: Deferred.  Neuro: No focal deficits. Steady gait.  Psych: Mood and affect normal and appropriate for situation.  MSK: No focal spinal tenderness to palpation, full range of motion in bilateral upper extremities Extremities: No edema. Skin: Warm and dry.  LABORATORY DATA:  None for this visit   DIAGNOSTIC IMAGING:  Most recent mammogram:  CLINICAL DATA:  Malignant lumpectomy of the RIGHT breast in 2011.Annual evaluation.  EXAM: DIGITAL DIAGNOSTIC BILATERAL MAMMOGRAM WITH CAD AND TOMO  COMPARISON:  Previous exam(s).  ACR Breast Density Category b: There are scattered areas of fibroglandular density.  FINDINGS: Tomosynthesis and synthesized full field CC and MLO views of both breasts were obtained.  Post surgical scar/architectural distortion at the lumpectomy site in the retroareolar RIGHT breast at ANTERIOR depth. Residual mild post radiation skin thickening and trabecular thickening, unchanged. No new or suspicious findings in the RIGHT breast.  No findings suspicious for malignancy in the LEFT breast.  Mammographic images were processed with CAD.  IMPRESSION: 1. No mammographic evidence of malignancy involving either breast. 2. Expected post lumpectomy changes and residual mild post radiation changes involving the RIGHT breast.  RECOMMENDATION: Screening mammogram in one year.(Code:SM-B-01Y)  I have discussed the findings and recommendations with the patient. If applicable, a reminder letter will be sent to the patient regarding the next appointment.  BI-RADS CATEGORY  2:  Benign.   Electronically Signed   By: Evangeline Dakin M.D.   On: 09/26/2019 12:29 Most recent bone density EXAM: DUAL X-RAY ABSORPTIOMETRY (DXA) FOR BONE MINERAL DENSITY  IMPRESSION: Referring Physician:  Gardenia Phlegm Your patient completed a BMD test using Lunar IDXA DXA system ( analysis version: 16 ) manufactured by [employer]. Technologist: AW PATIENT:  Name: Lindsey Price, Lindsey Price Patient ID: [id number] Birth Date: Apr 06, 1946 Height: 66.8 in. Sex: Female Measured: 10/31/2019 Weight: 143.0 lbs. Indications: Advanced Age, Aromasin, Breast Cancer History, Caucasian, Effexor, Estrogen Deficient, Hypothyroid, Hysterectomy, Levothyroxine, Nexium, Postmenopausal Fractures: None Treatments: Calcium (E943.0), Vitamin D (E933.5)  ASSESSMENT: The BMD measured at Femur Neck Left is 0.856 g/cm2 with a T-score of -1.3. This patient is considered osteopenic according to [facility]) criteria. Compared with the prior study on 05/29/2017 the BMD of the total mean hip shows a statistically significant decrease. The scan quality is good. L-3 and L-4 were excluded due to degenerative changes.  Site Region Measured Date Measured Age YA BMD Significant CHANGE T-score AP Spine  L1-L2      10/31/2019    75.4         -1.3    1.018 g/cm2 AP Spine  L1-L2      05/29/2017    73.0         -1.1    1.040 g/cm2  DualFemur Neck Left  10/31/2019    75.4         -1.3    0.856 g/cm2 DualFemur Neck Left  05/29/2017    73.0         -1.1    0.880 g/cm2  DualFemur Total Mean 10/31/2019 75.4 -0.7 0.921 g/cm2 * DualFemur Total Mean 05/29/2017    73.0         -0.2    0.977 g/cm2  World Health Organization [facility]) criteria for post-menopausal, Caucasian Women: Normal       T-score at or above -1 SD Osteopenia   T-score between -1 and -2.5 SD Osteoporosis T-score at or below -2.5 SD  RECOMMENDATION: 1. All patients should optimize calcium and vitamin D intake. 2. Consider  FDA approved medical therapies in postmenopausal women and men aged 30 years and older, based on the following: a. A hip or vertebral (clinical or morphometric) fracture b. T- score < or = -2.5 at the femoral neck or spine after appropriate evaluation to exclude secondary causes c. Low bone mass (T-score between -1.0 and -2.5 at the femoral neck or spine) and a 10 year probability of a hip fracture > or = 3% or a 10 year probability of a major osteoporosis-related fracture > or = 20% based on the [place]-adapted WHO algorithm d. Clinician judgment and/or patient preferences may indicate treatment for people with 10-year fracture probabilities above or below these levels  FOLLOW-UP: Patients with diagnosis of osteoporosis or at high risk for fracture should have regular bone mineral density tests. For patients eligible for Medicare, routine testing is allowed once every 2 years. The testing frequency can be increased to one year for patients who have rapidly progressing disease, those who are receiving or discontinuing medical therapy to restore bone mass, or have additional risk factors.  I have reviewed this report and agree with the above findings. [facility] FRAX* 10-year Probability of Fracture Based on femoral neck BMD: DualFemur (Left)  Major Osteoporotic Fracture: 10.1% Hip Fracture:                1.9% Population:                  [place] (Caucasian) Risk Factors:                None  *FRAX is a Materials engineer of the [employer] for Metabolic Bone Disease, a World Pharmacologist (WHO) [facility]. ASSESSMENT:  The probability of a major osteoporotic fracture is 10.1 % within the next ten years.  The probability of a hip fracture is 1.9 % within the next ten years.   Electronically Signed   By: Lowella Grip III M.D.   On: 10/31/2019 13:11   ASSESSMENT AND PLAN:  Ms.. Swayze is a pleasant 74 y.o. female  with history of Stage 0 right breast DCIS, ER+/PR+, diagnosed in 09/2009, treated with lumpectomy, and anti-estrogen therapy with Aromasin x 5 years completing therapy in 03/2015.  [facility] for surveillance and routine follow-up.   1. History of breast cancer:  Ms. Mercer is currently clinically and radiographically without evidence of disease or recurrence of breast cancer. She will be due for mammogram in 09/2019; orders placed today.  She prefers diagnostic bilateral mammograms instead of screening mammograms, and I placed orders for this today.  She understands and is willing to pay the balance should insurance not cover the test.  She will return in 1 year for LTS follow up.  I encouraged her to call me with any questions or concerns before her next visit at the cancer center, and I would be happy to see her sooner, if needed.    2. Bone health:  Given Ms. Harvill's age, history of breast cancer, she is at risk for bone demineralization. Her last DEXA scan was on 10/31/2019 and indicated osteopenia with a t score of -1.3 in the left femur and spine.  She is taking caclium, Vitamin D, and exercises regularly. She was given education on specific food and activities to promote bone health.  3. Cancer screening:  Due to Ms. Longie's history and her age, she should receive screening for skin cancers, colon cancer. She was encouraged to follow-up with her PCP for appropriate cancer screenings.   4. Health maintenance and wellness promotion: Ms. Gamm was encouraged to consume 5-7 servings of fruits and vegetables per day. She was also encouraged to engage in moderate to vigorous exercise for 30 minutes per day most days of the week. She was instructed to limit her alcohol consumption and continue to abstain from tobacco use.    Dispo:  -Return to cancer center in one year for LTS follow up -Mammogram in 09/2020 -DEXA in 10/2021 -She was recommended to follow appropriate  pandemic precautions -She knows to call for any questions that may arise between now and her next appointment.    Total encounter time: 20 minutes*  Wilber Bihari, NP 06/09/20 11:42 AM Medical Oncology and Hematology [facility] [place], [place] [address] Tel. [phone]    Fax. [phone]  *Total Encounter Time as defined by the Centers for Medicare and Medicaid Services includes, in addition to the face-to-face time of a patient visit (documented in the note above) non-face-to-face time: obtaining and reviewing outside history, ordering and reviewing medications, tests or procedures, care coordination (communications with other health care professionals or caregivers) and documentation in the medical record.   Note: PRIMARY CARE PROVIDER Leonides Sake, [place] [phone]

## 2020-06-11 ENCOUNTER — Other Ambulatory Visit: Payer: Self-pay

## 2020-06-11 NOTE — Telephone Encounter (Signed)
Pt called stating she needs refill for Venlafaxine but that the order is not complete and pharmacist informed pt it was coming from Wilber Bihari, NP. I see a pending order for different directions. Mendel Ryder, how do you want to proceed?

## 2020-06-16 DIAGNOSIS — L218 Other seborrheic dermatitis: Secondary | ICD-10-CM | POA: Diagnosis not present

## 2020-06-16 DIAGNOSIS — D225 Melanocytic nevi of trunk: Secondary | ICD-10-CM | POA: Diagnosis not present

## 2020-06-16 DIAGNOSIS — B078 Other viral warts: Secondary | ICD-10-CM | POA: Diagnosis not present

## 2020-06-16 DIAGNOSIS — Z1283 Encounter for screening for malignant neoplasm of skin: Secondary | ICD-10-CM | POA: Diagnosis not present

## 2020-07-23 DIAGNOSIS — E559 Vitamin D deficiency, unspecified: Secondary | ICD-10-CM | POA: Diagnosis not present

## 2020-07-23 DIAGNOSIS — E039 Hypothyroidism, unspecified: Secondary | ICD-10-CM | POA: Diagnosis not present

## 2020-07-23 DIAGNOSIS — R7303 Prediabetes: Secondary | ICD-10-CM | POA: Diagnosis not present

## 2020-07-23 DIAGNOSIS — E7800 Pure hypercholesterolemia, unspecified: Secondary | ICD-10-CM | POA: Diagnosis not present

## 2020-07-23 DIAGNOSIS — Z79899 Other long term (current) drug therapy: Secondary | ICD-10-CM | POA: Diagnosis not present

## 2020-07-27 DIAGNOSIS — I10 Essential (primary) hypertension: Secondary | ICD-10-CM | POA: Diagnosis not present

## 2020-07-27 DIAGNOSIS — R7303 Prediabetes: Secondary | ICD-10-CM | POA: Diagnosis not present

## 2020-07-27 DIAGNOSIS — E039 Hypothyroidism, unspecified: Secondary | ICD-10-CM | POA: Diagnosis not present

## 2020-07-27 DIAGNOSIS — E7800 Pure hypercholesterolemia, unspecified: Secondary | ICD-10-CM | POA: Diagnosis not present

## 2020-07-27 DIAGNOSIS — Z6822 Body mass index (BMI) 22.0-22.9, adult: Secondary | ICD-10-CM | POA: Diagnosis not present

## 2020-07-27 DIAGNOSIS — E559 Vitamin D deficiency, unspecified: Secondary | ICD-10-CM | POA: Diagnosis not present

## 2020-08-03 ENCOUNTER — Ambulatory Visit: Payer: PPO | Admitting: Pulmonary Disease

## 2020-08-06 DIAGNOSIS — K148 Other diseases of tongue: Secondary | ICD-10-CM | POA: Diagnosis not present

## 2020-08-06 DIAGNOSIS — R197 Diarrhea, unspecified: Secondary | ICD-10-CM | POA: Diagnosis not present

## 2020-08-06 DIAGNOSIS — Z6822 Body mass index (BMI) 22.0-22.9, adult: Secondary | ICD-10-CM | POA: Diagnosis not present

## 2020-08-10 ENCOUNTER — Ambulatory Visit: Payer: PPO | Admitting: Pulmonary Disease

## 2020-08-12 ENCOUNTER — Other Ambulatory Visit: Payer: Self-pay | Admitting: Adult Health

## 2020-08-12 DIAGNOSIS — Z1231 Encounter for screening mammogram for malignant neoplasm of breast: Secondary | ICD-10-CM

## 2020-08-12 DIAGNOSIS — Z9889 Other specified postprocedural states: Secondary | ICD-10-CM

## 2020-09-04 DIAGNOSIS — C50912 Malignant neoplasm of unspecified site of left female breast: Secondary | ICD-10-CM

## 2020-09-04 HISTORY — DX: Malignant neoplasm of unspecified site of left female breast: C50.912

## 2020-09-14 ENCOUNTER — Ambulatory Visit: Payer: PPO | Admitting: Pulmonary Disease

## 2020-09-14 DIAGNOSIS — E039 Hypothyroidism, unspecified: Secondary | ICD-10-CM | POA: Diagnosis not present

## 2020-09-16 ENCOUNTER — Other Ambulatory Visit: Payer: Self-pay | Admitting: Pulmonary Disease

## 2020-09-16 DIAGNOSIS — J309 Allergic rhinitis, unspecified: Secondary | ICD-10-CM

## 2020-09-27 ENCOUNTER — Ambulatory Visit
Admission: RE | Admit: 2020-09-27 | Discharge: 2020-09-27 | Disposition: A | Discharge status: Home / Self Care | Payer: PPO | Source: Ambulatory Visit | Attending: Adult Health | Admitting: Adult Health

## 2020-09-27 ENCOUNTER — Other Ambulatory Visit: Payer: Self-pay | Admitting: Adult Health

## 2020-09-27 ENCOUNTER — Other Ambulatory Visit: Payer: Self-pay

## 2020-09-27 DIAGNOSIS — R928 Other abnormal and inconclusive findings on diagnostic imaging of breast: Secondary | ICD-10-CM | POA: Diagnosis not present

## 2020-09-27 DIAGNOSIS — Z9889 Other specified postprocedural states: Secondary | ICD-10-CM

## 2020-09-27 DIAGNOSIS — N6321 Unspecified lump in the left breast, upper outer quadrant: Secondary | ICD-10-CM | POA: Diagnosis not present

## 2020-09-27 DIAGNOSIS — Z853 Personal history of malignant neoplasm of breast: Secondary | ICD-10-CM | POA: Diagnosis not present

## 2020-09-27 DIAGNOSIS — Z1231 Encounter for screening mammogram for malignant neoplasm of breast: Secondary | ICD-10-CM

## 2020-09-27 DIAGNOSIS — C50412 Malignant neoplasm of upper-outer quadrant of left female breast: Secondary | ICD-10-CM | POA: Diagnosis not present

## 2020-09-27 DIAGNOSIS — Z170 Estrogen receptor positive status [ER+]: Secondary | ICD-10-CM | POA: Diagnosis not present

## 2020-09-27 DIAGNOSIS — N6489 Other specified disorders of breast: Secondary | ICD-10-CM | POA: Diagnosis not present

## 2020-09-28 ENCOUNTER — Ambulatory Visit: Payer: PPO | Admitting: Pulmonary Disease

## 2020-09-30 ENCOUNTER — Encounter: Payer: Self-pay | Admitting: *Deleted

## 2020-09-30 ENCOUNTER — Telehealth: Payer: Self-pay | Admitting: Hematology and Oncology

## 2020-09-30 NOTE — Telephone Encounter (Signed)
Scheduled appt per 1/26 sch msg - pt is aware or appt date and time

## 2020-10-01 ENCOUNTER — Encounter (HOSPITAL_COMMUNITY): Payer: Self-pay

## 2020-10-01 ENCOUNTER — Inpatient Hospital Stay (HOSPITAL_COMMUNITY)
Admission: EM | Admit: 2020-10-01 | Discharge: 2020-10-04 | DRG: 392 | Disposition: A | Discharge status: Home / Self Care | Payer: PPO | Attending: Surgery | Admitting: Surgery

## 2020-10-01 ENCOUNTER — Emergency Department (HOSPITAL_COMMUNITY): Payer: PPO

## 2020-10-01 ENCOUNTER — Other Ambulatory Visit: Payer: Self-pay

## 2020-10-01 DIAGNOSIS — Z87891 Personal history of nicotine dependence: Secondary | ICD-10-CM | POA: Diagnosis not present

## 2020-10-01 DIAGNOSIS — Z86000 Personal history of in-situ neoplasm of breast: Secondary | ICD-10-CM | POA: Diagnosis not present

## 2020-10-01 DIAGNOSIS — Z79899 Other long term (current) drug therapy: Secondary | ICD-10-CM

## 2020-10-01 DIAGNOSIS — R188 Other ascites: Secondary | ICD-10-CM

## 2020-10-01 DIAGNOSIS — Z888 Allergy status to other drugs, medicaments and biological substances status: Secondary | ICD-10-CM | POA: Diagnosis not present

## 2020-10-01 DIAGNOSIS — K219 Gastro-esophageal reflux disease without esophagitis: Secondary | ICD-10-CM | POA: Diagnosis present

## 2020-10-01 DIAGNOSIS — R1032 Left lower quadrant pain: Secondary | ICD-10-CM | POA: Diagnosis present

## 2020-10-01 DIAGNOSIS — Z79890 Hormone replacement therapy (postmenopausal): Secondary | ICD-10-CM | POA: Diagnosis not present

## 2020-10-01 DIAGNOSIS — R197 Diarrhea, unspecified: Secondary | ICD-10-CM | POA: Diagnosis not present

## 2020-10-01 DIAGNOSIS — Z86010 Personal history of colonic polyps: Secondary | ICD-10-CM

## 2020-10-01 DIAGNOSIS — D0512 Intraductal carcinoma in situ of left breast: Secondary | ICD-10-CM | POA: Diagnosis present

## 2020-10-01 DIAGNOSIS — Z90710 Acquired absence of both cervix and uterus: Secondary | ICD-10-CM

## 2020-10-01 DIAGNOSIS — R918 Other nonspecific abnormal finding of lung field: Secondary | ICD-10-CM | POA: Diagnosis not present

## 2020-10-01 DIAGNOSIS — R1900 Intra-abdominal and pelvic swelling, mass and lump, unspecified site: Secondary | ICD-10-CM

## 2020-10-01 DIAGNOSIS — Z20822 Contact with and (suspected) exposure to covid-19: Secondary | ICD-10-CM | POA: Diagnosis present

## 2020-10-01 DIAGNOSIS — Z881 Allergy status to other antibiotic agents status: Secondary | ICD-10-CM

## 2020-10-01 DIAGNOSIS — K9289 Other specified diseases of the digestive system: Secondary | ICD-10-CM | POA: Diagnosis not present

## 2020-10-01 DIAGNOSIS — J45909 Unspecified asthma, uncomplicated: Secondary | ICD-10-CM | POA: Diagnosis not present

## 2020-10-01 DIAGNOSIS — E785 Hyperlipidemia, unspecified: Secondary | ICD-10-CM | POA: Diagnosis present

## 2020-10-01 DIAGNOSIS — R140 Abdominal distension (gaseous): Secondary | ICD-10-CM

## 2020-10-01 DIAGNOSIS — K529 Noninfective gastroenteritis and colitis, unspecified: Secondary | ICD-10-CM | POA: Diagnosis not present

## 2020-10-01 DIAGNOSIS — R1031 Right lower quadrant pain: Secondary | ICD-10-CM | POA: Diagnosis not present

## 2020-10-01 DIAGNOSIS — R933 Abnormal findings on diagnostic imaging of other parts of digestive tract: Secondary | ICD-10-CM | POA: Diagnosis not present

## 2020-10-01 DIAGNOSIS — I10 Essential (primary) hypertension: Secondary | ICD-10-CM | POA: Diagnosis not present

## 2020-10-01 DIAGNOSIS — E039 Hypothyroidism, unspecified: Secondary | ICD-10-CM | POA: Diagnosis not present

## 2020-10-01 DIAGNOSIS — R109 Unspecified abdominal pain: Secondary | ICD-10-CM | POA: Diagnosis not present

## 2020-10-01 LAB — LIPASE, BLOOD: Lipase: 16 U/L (ref 11–51)

## 2020-10-01 LAB — URINALYSIS, MICROSCOPIC (REFLEX)

## 2020-10-01 LAB — URINALYSIS, ROUTINE W REFLEX MICROSCOPIC
Glucose, UA: NEGATIVE mg/dL
Hgb urine dipstick: NEGATIVE
Ketones, ur: 15 mg/dL — AB
Nitrite: NEGATIVE
Specific Gravity, Urine: 1.025 (ref 1.005–1.030)
pH: 6 (ref 5.0–8.0)

## 2020-10-01 LAB — COMPREHENSIVE METABOLIC PANEL
ALT: 17 U/L (ref 0–44)
AST: 14 U/L — ABNORMAL LOW (ref 15–41)
Albumin: 2.9 g/dL — ABNORMAL LOW (ref 3.5–5.0)
Alkaline Phosphatase: 86 U/L (ref 38–126)
Anion gap: 11 (ref 5–15)
BUN: 11 mg/dL (ref 8–23)
CO2: 21 mmol/L — ABNORMAL LOW (ref 22–32)
Calcium: 8.7 mg/dL — ABNORMAL LOW (ref 8.9–10.3)
Chloride: 104 mmol/L (ref 98–111)
Creatinine, Ser: 0.76 mg/dL (ref 0.44–1.00)
GFR, Estimated: >60 mL/min (ref >60)
Glucose, Bld: 120 mg/dL — ABNORMAL HIGH (ref 70–99)
Potassium: 4.1 mmol/L (ref 3.5–5.1)
Sodium: 136 mmol/L (ref 135–145)
Total Bilirubin: 0.7 mg/dL (ref 0.3–1.2)
Total Protein: 6.7 g/dL (ref 6.5–8.1)

## 2020-10-01 LAB — CBC
HCT: 34 % — ABNORMAL LOW (ref 36.0–46.0)
Hemoglobin: 10.4 g/dL — ABNORMAL LOW (ref 12.0–15.0)
MCH: 24.8 pg — ABNORMAL LOW (ref 26.0–34.0)
MCHC: 30.6 g/dL (ref 30.0–36.0)
MCV: 81.1 fL (ref 80.0–100.0)
Platelets: 413 10*3/uL — ABNORMAL HIGH (ref 150–400)
RBC: 4.19 MIL/uL (ref 3.87–5.11)
RDW: 16.7 % — ABNORMAL HIGH (ref 11.5–15.5)
WBC: 11.9 10*3/uL — ABNORMAL HIGH (ref 4.0–10.5)
nRBC: 0 % (ref 0.0–0.2)

## 2020-10-01 LAB — SARS CORONAVIRUS 2 BY RT PCR (HOSPITAL ORDER, PERFORMED IN ~~LOC~~ HOSPITAL LAB): SARS Coronavirus 2: NEGATIVE

## 2020-10-01 MED ORDER — LEVOTHYROXINE SODIUM 88 MCG PO TABS: 88.0000 ug | ORAL_TABLET | ORAL | Status: DC

## 2020-10-01 MED ORDER — ACETAMINOPHEN 325 MG PO TABS
650.0000 mg | ORAL_TABLET | Freq: Four times a day (QID) | ORAL | Status: DC | PRN
  Administered 2020-10-02 – 2020-10-03 (×2): 650 mg via ORAL
  Filled 2020-10-01 (×2): qty 2

## 2020-10-01 MED ORDER — TRAMADOL HCL 50 MG PO TABS
50.0000 mg | ORAL_TABLET | Freq: Four times a day (QID) | ORAL | Status: DC | PRN
  Administered 2020-10-02: 50 mg via ORAL
  Filled 2020-10-01: qty 1

## 2020-10-01 MED ORDER — ONDANSETRON HCL 4 MG/2ML IJ SOLN
4.0000 mg | Freq: Once | INTRAMUSCULAR | Status: AC
  Administered 2020-10-01: 4 mg via INTRAVENOUS
  Filled 2020-10-01: qty 2

## 2020-10-01 MED ORDER — ONDANSETRON 4 MG PO TBDP: 4.0000 mg | ORAL_TABLET | Freq: Four times a day (QID) | ORAL | Status: DC | PRN

## 2020-10-01 MED ORDER — SODIUM CHLORIDE 0.9 % IV SOLN
1000.0000 mL | INTRAVENOUS | Status: DC
  Administered 2020-10-01: 1000 mL via INTRAVENOUS

## 2020-10-01 MED ORDER — METOPROLOL TARTRATE 5 MG/5ML IV SOLN: 5.0000 mg | Freq: Four times a day (QID) | INTRAVENOUS | Status: DC | PRN

## 2020-10-01 MED ORDER — SODIUM CHLORIDE 0.9 % IV SOLN
2.0000 g | INTRAVENOUS | Status: DC
  Administered 2020-10-02 – 2020-10-04 (×3): 2 g via INTRAVENOUS
  Filled 2020-10-01: qty 2
  Filled 2020-10-01: qty 20
  Filled 2020-10-01 (×2): qty 2

## 2020-10-01 MED ORDER — PANTOPRAZOLE SODIUM 40 MG PO TBEC
40.0000 mg | DELAYED_RELEASE_TABLET | Freq: Every day | ORAL | Status: DC
  Administered 2020-10-01 – 2020-10-04 (×4): 40 mg via ORAL
  Filled 2020-10-01 (×4): qty 1

## 2020-10-01 MED ORDER — MORPHINE SULFATE (PF) 2 MG/ML IV SOLN
2.0000 mg | INTRAVENOUS | Status: DC | PRN
  Administered 2020-10-02: 2 mg via INTRAVENOUS
  Filled 2020-10-01: qty 1

## 2020-10-01 MED ORDER — SODIUM CHLORIDE 0.9 % IV SOLN
2.0000 g | Freq: Once | INTRAVENOUS | Status: AC
  Administered 2020-10-01: 2 g via INTRAVENOUS
  Filled 2020-10-01: qty 20

## 2020-10-01 MED ORDER — LORATADINE 10 MG PO TABS
10.0000 mg | ORAL_TABLET | Freq: Every evening | ORAL | Status: DC
  Administered 2020-10-01 – 2020-10-03 (×3): 10 mg via ORAL
  Filled 2020-10-01 (×3): qty 1

## 2020-10-01 MED ORDER — LEVOTHYROXINE SODIUM 100 MCG PO TABS
100.0000 ug | ORAL_TABLET | ORAL | Status: DC
  Administered 2020-10-03 – 2020-10-04 (×2): 100 ug via ORAL
  Filled 2020-10-01 (×2): qty 1

## 2020-10-01 MED ORDER — ENOXAPARIN SODIUM 40 MG/0.4ML ~~LOC~~ SOLN
40.0000 mg | SUBCUTANEOUS | Status: AC
  Administered 2020-10-01: 20:00:00 40 mg via SUBCUTANEOUS
  Filled 2020-10-01: qty 0.4

## 2020-10-01 MED ORDER — METRONIDAZOLE IN NACL 5-0.79 MG/ML-% IV SOLN
500.0000 mg | Freq: Three times a day (TID) | INTRAVENOUS | Status: DC
  Administered 2020-10-01 – 2020-10-04 (×9): 500 mg via INTRAVENOUS
  Filled 2020-10-01 (×9): qty 100

## 2020-10-01 MED ORDER — METRONIDAZOLE IN NACL 5-0.79 MG/ML-% IV SOLN
500.0000 mg | Freq: Once | INTRAVENOUS | Status: AC
  Administered 2020-10-01: 500 mg via INTRAVENOUS
  Filled 2020-10-01: qty 100

## 2020-10-01 MED ORDER — DIPHENHYDRAMINE HCL 50 MG/ML IJ SOLN: 12.5000 mg | Freq: Four times a day (QID) | INTRAMUSCULAR | Status: DC | PRN

## 2020-10-01 MED ORDER — ONDANSETRON HCL 4 MG/2ML IJ SOLN: 4.0000 mg | Freq: Four times a day (QID) | INTRAMUSCULAR | Status: DC | PRN

## 2020-10-01 MED ORDER — DIPHENHYDRAMINE HCL 12.5 MG/5ML PO ELIX: 12.5000 mg | ORAL_SOLUTION | Freq: Four times a day (QID) | ORAL | Status: DC | PRN

## 2020-10-01 MED ORDER — KCL IN DEXTROSE-NACL 20-5-0.45 MEQ/L-%-% IV SOLN: INTRAVENOUS | Status: DC

## 2020-10-01 MED ORDER — IOHEXOL 300 MG/ML  SOLN
100.0000 mL | Freq: Once | INTRAMUSCULAR | Status: AC | PRN
  Administered 2020-10-01: 100 mL via INTRAVENOUS

## 2020-10-01 MED ORDER — VENLAFAXINE HCL ER 75 MG PO CP24
75.0000 mg | ORAL_CAPSULE | Freq: Every day | ORAL | Status: DC
Start: 2020-10-01 — End: 2020-10-05
  Administered 2020-10-01 – 2020-10-04 (×4): 75 mg via ORAL
  Filled 2020-10-01 (×4): qty 1

## 2020-10-01 MED ORDER — DEXTROSE-NACL 5-0.45 % IV SOLN: INTRAVENOUS | Status: DC

## 2020-10-01 MED ORDER — MORPHINE SULFATE (PF) 4 MG/ML IV SOLN
4.0000 mg | Freq: Once | INTRAVENOUS | Status: AC
  Administered 2020-10-01: 4 mg via INTRAVENOUS
  Filled 2020-10-01: qty 1

## 2020-10-01 MED ORDER — SODIUM CHLORIDE 0.9 % IV BOLUS (SEPSIS)
500.0000 mL | Freq: Once | INTRAVENOUS | Status: AC
  Administered 2020-10-01: 500 mL via INTRAVENOUS

## 2020-10-01 NOTE — ED Triage Notes (Signed)
Patient c/o abdominal pain and diarrhea x 2 days

## 2020-10-01 NOTE — ED Notes (Signed)
Patient received lunch tray 

## 2020-10-01 NOTE — H&P (Addendum)
[facility] Surgery Admission Note  Lindsey Price Jan 22, 1946  [id number].    Requesting MD: Tomi Bamberger, MD Chief Complaint/Reason for Consult: colitis, possible abscess HPI:  Lindsey Price is a pleasant 75 y/o F with a PMH HTN, HLD, Hypothyroidism, allergies, and DCIS who presented to [facility] with a cc lower abdominal pain. Reports cramping lower abdominal pain that started on Wednesday evening, associated with diarrhea. Cramping pain would improve after bowel movements. This pain continued throughout Thursday and worsened, so she came to ED today (Friday 1/28) for evaluation. Patient denies similar pain in the past. Associated sxs include mild nausea and decreased appetite. She denies fever, chills, vomiting, hematochezia, melena, or urinary sxs. She denies recent travel or trying new foods. She denies a personal history of abnormal colonoscopy (last scoped 10/2019) or inflammatory bowel disease. She reports a history of vaginal hysterectomy but denies any abdominal surgeries including appendectomy, cholecystectomy, bowel resection, stomach repair.   She is a former smoker who quit in the 1980's. She reports drinking alcohol socially, 1-2 glasses of wine, but not daily. Denies other drug use. She lives with her husband. Denies use of blood thinning medications.  ROS: As above Review of Systems  All other systems reviewed and are negative.   Family History  Problem Relation Age of Onset  . Cancer Mother        BREAST  . Cancer Father        pancreatic  . Cancer Sister        sebacous cell carcinoma  . Cancer Maternal Aunt        lung  . Cancer Paternal Uncle        colon, stomach  . Colon cancer Neg Hx   . Colon polyps Neg Hx   . Esophageal cancer Neg Hx   . Rectal cancer Neg Hx   . Stomach cancer Neg Hx     Past Medical History:  Diagnosis Date  . Allergy   . Arthralgia 09/11/2011  . Asthma   . Breast cancer ([place]) 2011   RIGHT BREAST CA  . 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 Hyperlipidemia   . Hypertension   . Insomnia   . Thyroid disease    hypothyroidism    Past Surgical History:  Procedure Laterality Date  . ABDOMINAL HYSTERECTOMY  1975   partial  . BREAST BIOPSY Right 09/10/2013   BENIGN  . BREAST BIOPSY Left 09/07/2011   BENIGN  . BREAST LUMPECTOMY Right 2011  . CATARACT EXTRACTION, BILATERAL    . COLONOSCOPY  2015  . COLONOSCOPY WITH PROPOFOL N/A 04/14/2014   Procedure: COLONOSCOPY WITH PROPOFOL;  Surgeon: Garlan Fair, MD;  Location: [facility] ENDOSCOPY;  Service: Endoscopy;  Laterality: N/A;  . POLYPECTOMY    . ROTATOR CUFF REPAIR Left 04/2013   Dr. Para March    Social History:  reports that she quit smoking about 41 years ago. Her smoking use included cigarettes. She has a 15.00 pack-year smoking history. She has never used smokeless tobacco. She reports current alcohol use of about 7.0 standard drinks of alcohol per week. She reports that she does not use drugs.  Allergies:  Allergies  Allergen Reactions  . Advair Diskus [Fluticasone-Salmeterol] Other (See Comments)    Hoarseness.  . Neosporin [Neomycin-Polymyxin-Gramicidin] Itching    (Not in a hospital admission)   Blood pressure 127/72, pulse 86, temperature 98.1 F (36.7 C), temperature source Oral, resp. rate 18, height 5\' 7"  (1.702 m), weight 59 kg,  SpO2 96 %. Physical Exam: Constitutional: NAD; conversant; no deformities Eyes: Moist conjunctiva; no lid lag; anicteric; PERRL Neck: Trachea midline; no thyromegaly Lungs: Normal respiratory effort; no tactile fremitus  CV: RRR 77bpm on monitor;  no pitting edema, pedal pulses 2+ BL GI: Abd soft, mildly distended, audible BS without stethoscope, there is fullness in the lower abdomen R>L with RLQ tenderness, there is no guarding or peritonitis, no palpable hepatosplenomegaly MSK: symmetrical, no clubbing/cyanosis Psychiatric: Appropriate affect; alert and oriented x3 Lymphatic: Not examined  Neuro: CN  II-XII grossly in tact no focal deficits   Results for orders placed or performed during the hospital encounter of 10/01/20 (from the past 48 hour(s))  Lipase, blood     Status: None   Collection Time: 10/01/20  8:35 AM  Result Value Ref Range   Lipase 16 11 - 51 U/L    Comment: Performed at [facility], [address], [place], [place] [address]  Comprehensive metabolic panel     Status: Abnormal   Collection Time: 10/01/20  8:35 AM  Result Value Ref Range   Sodium 136 135 - 145 mmol/L   Potassium 4.1 3.5 - 5.1 mmol/L   Chloride 104 98 - 111 mmol/L   CO2 21 (L) 22 - 32 mmol/L   Glucose, Bld 120 (H) 70 - 99 mg/dL    Comment: Glucose reference range applies only to samples taken after fasting for at least 8 hours.   BUN 11 8 - 23 mg/dL   Creatinine, Ser 2.83 0.44 - 1.00 mg/dL   Calcium 8.7 (L) 8.9 - 10.3 mg/dL   Total Protein 6.7 6.5 - 8.1 g/dL   Albumin 2.9 (L) 3.5 - 5.0 g/dL   AST 14 (L) 15 - 41 U/L   ALT 17 0 - 44 U/L   Alkaline Phosphatase 86 38 - 126 U/L   Total Bilirubin 0.7 0.3 - 1.2 mg/dL   GFR, Estimated >15 >17 mL/min    Comment: (NOTE) Calculated using the CKD-EPI Creatinine Equation (2021)    Anion gap 11 5 - 15    Comment: Performed at [facility], [address] [place], [place] [address]  CBC     Status: Abnormal   Collection Time: 10/01/20  8:35 AM  Result Value Ref Range   WBC 11.9 (H) 4.0 - 10.5 K/uL   RBC 4.19 3.87 - 5.11 MIL/uL   Hemoglobin 10.4 (L) 12.0 - 15.0 g/dL   HCT 37.1 (L) 06.2 - 69.4 %   MCV 81.1 80.0 - 100.0 fL   MCH 24.8 (L) 26.0 - 34.0 pg   MCHC 30.6 30.0 - 36.0 g/dL   RDW 85.4 (H) 62.7 - 03.5 %   Platelets 413 (H) 150 - 400 K/uL   nRBC 0.0 0.0 - 0.2 %    Comment: Performed at [facility], [address], [place], [place] [address]  Urinalysis, Routine w reflex microscopic Urine, Clean Catch     Status: Abnormal   Collection Time: 10/01/20  8:35 AM  Result Value Ref Range    Color, Urine YELLOW YELLOW   APPearance CLEAR CLEAR   Specific Gravity, Urine 1.025 1.005 - 1.030   pH 6.0 5.0 - 8.0   Glucose, UA NEGATIVE NEGATIVE mg/dL   Hgb urine dipstick NEGATIVE NEGATIVE   Bilirubin Urine SMALL (A) NEGATIVE   Ketones, ur 15 (A) NEGATIVE mg/dL   Protein, ur TRACE (A) NEGATIVE mg/dL   Nitrite NEGATIVE NEGATIVE   Leukocytes,Ua TRACE (A)  NEGATIVE    Comment: Performed at [facility], [place] [address], [place], [place] [address]  Urinalysis, Microscopic (reflex)     Status: Abnormal   Collection Time: 10/01/20  8:35 AM  Result Value Ref Range   RBC / HPF 0-5 0 - 5 RBC/hpf   WBC, UA 0-5 0 - 5 WBC/hpf   Bacteria, UA FEW (A) NONE SEEN   Squamous Epithelial / LPF 0-5 0 - 5   Urine-Other MICROSCOPIC EXAM PERFORMED ON UNCONCENTRATED URINE     Comment: LESS THAN 10 mL OF URINE SUBMITTED MUCOUS PRESENT Performed at [facility], [place] [address] [place], [place] [address]   SARS Coronavirus 2 by RT PCR (hospital order, performed in [facility] hospital lab) Nasopharyngeal Nasopharyngeal Swab     Status: None   Collection Time: 10/01/20 12:22 PM   Specimen: Nasopharyngeal Swab  Result Value Ref Range   SARS Coronavirus 2 NEGATIVE NEGATIVE    Comment: (NOTE) SARS-CoV-2 target nucleic acids are NOT DETECTED.  The SARS-CoV-2 RNA is generally detectable in upper and lower respiratory specimens during the acute phase of infection. The lowest concentration of SARS-CoV-2 viral copies this assay can detect is 250 copies / mL. A negative result does not preclude SARS-CoV-2 infection and should not be used as the sole basis for treatment or other patient management decisions.  A negative result may occur with improper specimen collection / handling, submission of specimen other than nasopharyngeal swab, presence of viral mutation(s) within the areas targeted by this assay, and inadequate number of viral copies (<250 copies / mL). A negative  result must be combined with clinical observations, patient history, and epidemiological information.  Fact Sheet for Patients:   StrictlyIdeas.no  Fact Sheet for Healthcare Providers: BankingDealers.co.za  This test is not yet approved or  cleared by the [place] FDA and has been authorized for detection and/or diagnosis of SARS-CoV-2 by FDA under an Emergency Use Authorization (EUA).  This EUA will remain in effect (meaning this test can be used) for the duration of the COVID-19 declaration under Section 564(b)(1) of the Act, 21 U.S.C. section 360bbb-3(b)(1), unless the authorization is terminated or revoked sooner.  Performed at [facility], [place] [address] [place], [place] [address]    CT ABDOMEN PELVIS W CONTRAST  Addendum Date: 10/01/2020   ADDENDUM REPORT: 10/01/2020 11:53 ADDENDUM: These results were called by telephone at the time of interpretation on 10/01/2020 at 11:53 am to provider [facility] , who verbally acknowledged these results. Electronically Signed   By: Zetta Bills M.D.   On: 10/01/2020 11:53   Result Date: 10/01/2020 CLINICAL DATA:  Suspected diverticulitis in a patient with history of abdominal pain and diarrhea for 2 days EXAM: CT ABDOMEN AND PELVIS WITH CONTRAST TECHNIQUE: Multidetector CT imaging of the abdomen and pelvis was performed using the standard protocol following bolus administration of intravenous contrast. CONTRAST:  192mL OMNIPAQUE IOHEXOL 300 MG/ML  SOLN COMPARISON:  No priors are available for comparison. FINDINGS: Lower chest: Basilar atelectasis. No dense consolidation. No sign of pleural effusion. Small pulmonary nodule RIGHT lower lobe (image seven, series 6) 7 mm. Hepatobiliary: Suggested hepatic steatosis without focal, suspicious hepatic lesion. Portal vein is patent. SMV is patent. No hyperenhancement of the gallbladder wall. Fluid near the gallbladder but mainly  localized to the perihepatic space and RIGHT pericolic gutter about inflammatory changes involving bowel in the RIGHT hemiabdomen. No biliary duct dilation. Pancreas: Normal, without mass, inflammation or ductal dilatation.  Spleen: Normal Adrenals/Urinary Tract: Normal appearance of the adrenal glands. Symmetric renal enhancement. No signs of hydronephrosis. Smooth renal contours. Stomach/Bowel: Stomach under distended limiting assessment. No acute gastric process. Mural stratification of numerous small bowel loops beginning in the distal jejunum and extending through the ileum. Irregular collection without discernible bowel wall in the RIGHT lower quadrant in communication with colon and the ileum at 3 levels. No normal ileocecal valve could be demonstrated. Mural stratification is present within bowel loops leading into this collection. The collection measures 8.4 x 5.4 cm in greatest axial dimension an approximately 9 cm greatest craniocaudal span. There is abundant surrounding stranding and irregular peripheral enhancement of this area. Bowel upstream and around this showing variable areas of dilation and collapse with mural stratification. A C-shaped loop of ileum in the RIGHT lower quadrant best seen on image 71 of series 2 communicates at 2 levels, along the inferior and anterior margin of the collection. A second loop of bowel communicates along the anterolateral margin cephalad to these other areas of communication along the superior margin of the collection. Long segment mural stratification involving the distal jejunum and ileum is present with areas of suspected "skip type lesions" as well. There is no free air. Interloop fluid is present in the upper abdomen in the LEFT upper quadrant and in the lower abdomen around inflamed bowel loops and is associated with extensive mesenteric edema. Colon is collapsed. Vascular/Lymphatic: Aortic atherosclerosis both calcified and noncalcified. No aneurysmal  dilation. Mesenteric vessels including the SMV or grossly patent. No mesenteric adenopathy by size criteria with numerous small lymph nodes however in the RIGHT lower quadrant mesentery. Irregular margin of the area of concern in the RIGHT lower quadrant best seen on coronal images with areas measuring up to 11 mm along the margin of this collection. Reproductive: Post hysterectomy. Masslike area/collection contiguous with the RIGHT adnexa. Other: Small volume ascites both in the pelvis, LEFT hemiabdomen and along the RIGHT hemi liver. Musculoskeletal: No acute or destructive bone process. Spinal degenerative changes. IMPRESSION: 1. Irregular collection without discernible bowel wall in the area of the cecum in the RIGHT lower quadrant with complex fistulous network, associated with signs of severe enteritis and obstruction. 2. Findings could be related to severe sequela of inflammatory bowel disease based on appearance. Bowel compromise leading to severe enteritis is another differential consideration along with neoplasm. 3. Ascites and interloop fluid as described in the setting of marked enteritis. 4. Masslike area/collection contiguous with the RIGHT adnexa. 5. 7 mm RIGHT lower lobe pulmonary nodule without comparison is. Short interval follow-up is suggested in this patient to Fleischner society guidelines do not apply. Follow-up within 3 months or dedicated evaluation of the chest if neoplasm is discovered on further assessment. 6. Suggested hepatic steatosis without focal, suspicious hepatic lesion. 7. Aortic atherosclerosis. Aortic Atherosclerosis (ICD10-I70.0). Electronically Signed: By: Zetta Bills M.D. On: 10/01/2020 11:50   Assessment/Plan HTN - PRN IV metoprolol HLD GERD - takes pepcid at home, PPI ordered  Pulmonary nodules - chronic  hypothyroidism - home synthroid ordered as written; 88 mg every other day, 100 mcg in between DCIS - Dx this week by left breast Bx, scheduled to see Dr. Marlou Starks  10/05/20. Has a history of DCIS diagnosed 2011, s/p right breast lumpectomy 10/2009 (high grade DCIS, 1 SLN negative, ER 100%, PR 93%)   Lower abdominal pain Non-bloody diarrhea Ascending colitis with abnormal cecal process on CT scan - afebrile, hemodynamically stable, WBC 11.9. Cecum is enlarged and thick-walled, possible  perforation with abscess, possible fistulous connection to small bowel, cannot visualize the appendix. Repeat CT in AM with PO contrast to better evaluate.  Reactive ileus vs enteritis - low suspicion for ischemic enteritis given no significant history of vascular disease. IV abx colitis/enteritis for now  FEN: CLD, NPO after MN ID: Rocephin/Flagyl 1/28 >> VTE: SCD's, give one dose Lovenox then hold for scan tomorrow - if abscess is found will order IR perc drain.  Foley: none Dispo: admit to inpatient [facility] service, med-surg, IV abx    Jill Alexanders, [place] [facility] Please see Amion for pager number during day hours 7:00am-4:30pm 10/01/2020, 2:04 PM

## 2020-10-01 NOTE — ED Provider Notes (Signed)
[facility] DEPT Provider Note   CSN: [id number] Arrival date & time: 10/01/20  [id number]     History Chief Complaint  Patient presents with  . Abdominal Pain  . Diarrhea    Lindsey Price is a 75 y.o. female.  HPI   Patient presents to the emergency room for evaluation of abdominal pain and diarrhea.  Patient states she started having symptoms a couple of days ago.  She had several episodes of diarrhea, 4-5 during the day.  With each episode she had cramping lower abdominal pain.  She felt nauseated but did not vomit.  Yesterday she continued to have episodes although not quite as many.  This morning when she woke up she was having severe lower abdominal pain.  Mostly in the midline and on the left side of the lower abdomen.  She felt like she had to go the bathroom but was unable to to go.  She did have an episode of diarrhea here and is feeling slightly better.  She denies any fevers.  No history of prior abdominal problems.  Past Medical History:  Diagnosis Date  . Allergy   . Arthralgia 09/11/2011  . Asthma   . Breast cancer ([place]) 2011   RIGHT BREAST CA  . 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 Hyperlipidemia   . Hypertension   . Insomnia   . Thyroid disease    hypothyroidism    Patient Active Problem List   Diagnosis Date Noted  . Colitis, acute 10/01/2020  . Hoarseness, chronic 05/07/2020  . Flank pain 09/09/2019  . Insulin resistance/ pre-DM 08/21/2019  . Benign polyp of large intestine 08/21/2019  . Educated about COVID-19 virus infection 04/16/2019  . Chronic left hip pain 04/16/2019  . Piriformis muscle pain 04/16/2019  . Contact dermatitis 09/30/2018  . Traumatic complete tear of right rotator cuff 09/30/2018  . Chronic right shoulder pain 09/30/2018  . Muscle cramp, nocturnal 08/08/2018  . Low HDL (under 40) 04/04/2018  . Reactive airway disease 12/17/2017  . Vitamin D deficiency 04/12/2017  . Osteopenia  04/12/2017  . HTN (hypertension) 03/12/2017  . Hyperlipemia, mixed 03/12/2017  . Cataracts, bilateral- s/p surgery. 03/12/2017  . Hypothyroidism 03/12/2017  . Environmental and seasonal allergies 03/12/2017  . Adjustment disorder 03/12/2017  . GERD (gastroesophageal reflux disease) 03/12/2017  . Family history of multiple cancers 03/12/2017  . History of tobacco abuse 03/12/2017  . Allergic rhinitis 03/23/2015  . Cough variant asthma 02/23/2015  . Cancer of right breast, stage 0 09/11/2011  . Insomnia 09/11/2011  . Arthralgia 09/11/2011    Past Surgical History:  Procedure Laterality Date  . ABDOMINAL HYSTERECTOMY  1975   partial  . BREAST BIOPSY Right 09/10/2013   BENIGN  . BREAST BIOPSY Left 09/07/2011   BENIGN  . BREAST LUMPECTOMY Right 2011  . CATARACT EXTRACTION, BILATERAL    . COLONOSCOPY  2015  . COLONOSCOPY WITH PROPOFOL N/A 04/14/2014   Procedure: COLONOSCOPY WITH PROPOFOL;  Surgeon: Garlan Fair, MD;  Location: [facility];  Service: Endoscopy;  Laterality: N/A;  . POLYPECTOMY    . ROTATOR CUFF REPAIR Left 04/2013   Dr. Para March     OB History   No obstetric history on file.     Family History  Problem Relation Age of Onset  . Cancer Mother        BREAST  . Cancer Father        pancreatic  . Cancer Sister  sebacous cell carcinoma  . Cancer Maternal Aunt        lung  . Cancer Paternal Uncle        colon, stomach  . Colon cancer Neg Hx   . Colon polyps Neg Hx   . Esophageal cancer Neg Hx   . Rectal cancer Neg Hx   . Stomach cancer Neg Hx     Social History   Tobacco Use  . Smoking status: Former Smoker    Packs/day: 1.00    Years: 15.00    Pack years: 15.00    Types: Cigarettes    Quit date: 12/09/1978    Years since quitting: 41.8  . Smokeless tobacco: Never Used  Vaping Use  . Vaping Use: Never used  Substance Use Topics  . Alcohol use: Yes    Alcohol/week: 7.0 standard drinks    Types: 7 Glasses of wine per week    Comment:  SOCIALLY wine  . Drug use: No    Home Medications Prior to Admission medications   Medication Sig Start Date End Date Taking? Authorizing Provider  albuterol (PROAIR HFA) 108 (90 Base) MCG/ACT inhaler Inhale 1-2 puffs into the lungs every 6 (six) hours as needed for wheezing or shortness of breath. 05/07/20  Yes Lauraine Rinne, NP  atorvastatin (LIPITOR) 10 MG tablet Take 1 tablet (10 mg total) by mouth at bedtime. 11/12/19  Yes Opalski, Neoma Laming, DO  calcium carbonate (OS-CAL - DOSED IN MG OF ELEMENTAL CALCIUM) 1250 (500 Ca) MG tablet Take 1 tablet by mouth daily.   Yes [provider]  clobetasol (TEMOVATE) 0.05 % external solution Apply 1 application topically daily as needed (Scalp irritation). 06/25/20  Yes [provider]  esomeprazole (NEXIUM) 20 MG capsule Take 20 mg by mouth daily at 12 noon.   Yes [provider]  ezetimibe (ZETIA) 10 MG tablet Take 1 tablet (10 mg total) by mouth daily. 11/12/19  Yes Opalski, Deborah, DO  fluticasone (FLONASE) 50 MCG/ACT nasal spray PLACE 1 SPRAY INTO BOTH NOSTRILS DAILY. 09/16/20  Yes Lauraine Rinne, NP  fluticasone (FLOVENT HFA) 44 MCG/ACT inhaler Inhale 2 puffs into the lungs every 12 (twelve) hours. Patient taking differently: Inhale 2 puffs into the lungs 2 (two) times daily as needed (cough and asthma). 05/26/20  Yes Lauraine Rinne, NP  ibuprofen (ADVIL) 600 MG tablet Take 1 tablet (600 mg total) by mouth every 8 (eight) hours as needed. Patient taking differently: Take 600 mg by mouth every 8 (eight) hours as needed for mild pain. 09/09/19  Yes Opalski, Neoma Laming, DO  levocetirizine (XYZAL) 5 MG tablet Take 5 mg by mouth every evening.   Yes [provider]  levothyroxine (SYNTHROID) 100 MCG tablet TAKE 1 TABLET EVERY OTHER DAY ALTERNATING WITH 88 MCG TABLET Patient taking differently: Take 100 mcg by mouth See admin instructions. TAKE 100 MCG TABLET EVERY OTHER DAY ALTERNATING WITH 88 MCG TABLET 11/24/19  Yes Opalski,  Neoma Laming, DO  levothyroxine (SYNTHROID) 88 MCG tablet * Patient taking differently: Take 88 mcg by mouth See admin instructions. TAKE 88 MCG TABLET EVERY OTHER DAY ALTERNATING WITH 100 MCG TABLET 08/21/19  Yes Opalski, Deborah, DO  losartan (COZAAR) 25 MG tablet Take 1 tablet (25 mg total) by mouth daily. 11/12/19  Yes Opalski, Deborah, DO  montelukast (SINGULAIR) 10 MG tablet * Patient taking differently: Take 10 mg by mouth at bedtime. 08/21/19  Yes Opalski, Deborah, DO  Multiple Vitamin (MULTIVITAMIN WITH MINERALS) TABS tablet Take 1 tablet by mouth  daily.   Yes [provider]  omega-3 acid ethyl esters (LOVAZA) 1 g capsule TAKE 2 CAPSULES (2 G TOTAL) BY MOUTH 2 (TWO) TIMES DAILY. 11/13/19  Yes Opalski, Neoma Laming, DO  venlafaxine XR (EFFEXOR-XR) 75 MG 24 hr capsule TAKE 1 CAPSULE BY MOUTH EVERY DAY AT BEDTIME Patient taking differently: Take 75 mg by mouth daily. 06/11/20  Yes Causey, Charlestine Massed, NP  Vitamin D, Ergocalciferol, (DRISDOL) 1.25 MG (50000 UT) CAPS capsule TAKE 1 CAPSULE BY MOUTH ONCE WEEKLY Patient taking differently: Take 50,000 Units by mouth every Friday. 08/21/19  Yes Opalski, Deborah, DO    Allergies    Advair diskus [fluticasone-salmeterol] and Neosporin [neomycin-polymyxin-gramicidin]  Review of Systems   Review of Systems  All other systems reviewed and are negative.   Physical Exam Updated Vital Signs BP 127/72   Pulse 86   Temp 98.1 F (36.7 C) (Oral)   Resp 18   Ht 1.702 m (5\' 7" )   Wt 59 kg   SpO2 96%   BMI 20.36 kg/m   Physical Exam Vitals and nursing note reviewed.  Constitutional:      General: She is not in acute distress.    Appearance: She is well-developed and well-nourished.  HENT:     Head: Normocephalic and atraumatic.     Right Ear: External ear normal.     Left Ear: External ear normal.  Eyes:     General: No scleral icterus.       Right eye: No discharge.        Left eye: No discharge.     Conjunctiva/sclera:  Conjunctivae normal.  Neck:     Trachea: No tracheal deviation.  Cardiovascular:     Rate and Rhythm: Normal rate and regular rhythm.     Pulses: Intact distal pulses.  Pulmonary:     Effort: Pulmonary effort is normal. No respiratory distress.     Breath sounds: Normal breath sounds. No stridor. No wheezing or rales.  Abdominal:     General: Bowel sounds are normal. There is no distension.     Palpations: Abdomen is soft.     Tenderness: There is abdominal tenderness in the left lower quadrant. There is no guarding or rebound.  Musculoskeletal:        General: No tenderness or edema.     Cervical back: Neck supple.  Skin:    General: Skin is warm and dry.     Findings: No rash.  Neurological:     Mental Status: She is alert.     Cranial Nerves: No cranial nerve deficit (no facial droop, extraocular movements intact, no slurred speech).     Sensory: No sensory deficit.     Motor: No abnormal muscle tone or seizure activity.     Coordination: Coordination normal.     Deep Tendon Reflexes: Strength normal.  Psychiatric:        Mood and Affect: Mood and affect normal.     ED Results / Procedures / Treatments   Labs (all labs ordered are listed, but only abnormal results are displayed) Labs Reviewed  COMPREHENSIVE METABOLIC PANEL - Abnormal; Notable for the following components:      Result Value   CO2 21 (*)    Glucose, Bld 120 (*)    Calcium 8.7 (*)    Albumin 2.9 (*)    AST 14 (*)    All other components within normal limits  CBC - Abnormal; Notable for the following components:   WBC 11.9 (*)  Hemoglobin 10.4 (*)    HCT 34.0 (*)    MCH 24.8 (*)    RDW 16.7 (*)    Platelets 413 (*)    All other components within normal limits  URINALYSIS, ROUTINE W REFLEX MICROSCOPIC - Abnormal; Notable for the following components:   Bilirubin Urine SMALL (*)    Ketones, ur 15 (*)    Protein, ur TRACE (*)    Leukocytes,Ua TRACE (*)    All other components within normal limits   URINALYSIS, MICROSCOPIC (REFLEX) - Abnormal; Notable for the following components:   Bacteria, UA FEW (*)    All other components within normal limits  SARS CORONAVIRUS 2 BY RT PCR (HOSPITAL ORDER, [place] [facility])  LIPASE, BLOOD    EKG None  Radiology CT ABDOMEN PELVIS W CONTRAST  Addendum Date: 10/01/2020   ADDENDUM REPORT: 10/01/2020 11:53 ADDENDUM: These results were called by telephone at the time of interpretation on 10/01/2020 at 11:53 am to provider Josten Warmuth , who verbally acknowledged these results. Electronically Signed   By: Zetta Bills M.D.   On: 10/01/2020 11:53   Result Date: 10/01/2020 CLINICAL DATA:  Suspected diverticulitis in a patient with history of abdominal pain and diarrhea for 2 days EXAM: CT ABDOMEN AND PELVIS WITH CONTRAST TECHNIQUE: Multidetector CT imaging of the abdomen and pelvis was performed using the standard protocol following bolus administration of intravenous contrast. CONTRAST:  156mL OMNIPAQUE IOHEXOL 300 MG/ML  SOLN COMPARISON:  No priors are available for comparison. FINDINGS: Lower chest: Basilar atelectasis. No dense consolidation. No sign of pleural effusion. Small pulmonary nodule RIGHT lower lobe (image seven, series 6) 7 mm. Hepatobiliary: Suggested hepatic steatosis without focal, suspicious hepatic lesion. Portal vein is patent. SMV is patent. No hyperenhancement of the gallbladder wall. Fluid near the gallbladder but mainly localized to the perihepatic space and RIGHT pericolic gutter about inflammatory changes involving bowel in the RIGHT hemiabdomen. No biliary duct dilation. Pancreas: Normal, without mass, inflammation or ductal dilatation. Spleen: Normal Adrenals/Urinary Tract: Normal appearance of the adrenal glands. Symmetric renal enhancement. No signs of hydronephrosis. Smooth renal contours. Stomach/Bowel: Stomach under distended limiting assessment. No acute gastric process. Mural stratification of numerous small  bowel loops beginning in the distal jejunum and extending through the ileum. Irregular collection without discernible bowel wall in the RIGHT lower quadrant in communication with colon and the ileum at 3 levels. No normal ileocecal valve could be demonstrated. Mural stratification is present within bowel loops leading into this collection. The collection measures 8.4 x 5.4 cm in greatest axial dimension an approximately 9 cm greatest craniocaudal span. There is abundant surrounding stranding and irregular peripheral enhancement of this area. Bowel upstream and around this showing variable areas of dilation and collapse with mural stratification. A C-shaped loop of ileum in the RIGHT lower quadrant best seen on image 71 of series 2 communicates at 2 levels, along the inferior and anterior margin of the collection. A second loop of bowel communicates along the anterolateral margin cephalad to these other areas of communication along the superior margin of the collection. Long segment mural stratification involving the distal jejunum and ileum is present with areas of suspected "skip type lesions" as well. There is no free air. Interloop fluid is present in the upper abdomen in the LEFT upper quadrant and in the lower abdomen around inflamed bowel loops and is associated with extensive mesenteric edema. Colon is collapsed. Vascular/Lymphatic: Aortic atherosclerosis both calcified and noncalcified. No aneurysmal dilation. Mesenteric vessels  including the SMV or grossly patent. No mesenteric adenopathy by size criteria with numerous small lymph nodes however in the RIGHT lower quadrant mesentery. Irregular margin of the area of concern in the RIGHT lower quadrant best seen on coronal images with areas measuring up to 11 mm along the margin of this collection. Reproductive: Post hysterectomy. Masslike area/collection contiguous with the RIGHT adnexa. Other: Small volume ascites both in the pelvis, LEFT hemiabdomen and  along the RIGHT hemi liver. Musculoskeletal: No acute or destructive bone process. Spinal degenerative changes. IMPRESSION: 1. Irregular collection without discernible bowel wall in the area of the cecum in the RIGHT lower quadrant with complex fistulous network, associated with signs of severe enteritis and obstruction. 2. Findings could be related to severe sequela of inflammatory bowel disease based on appearance. Bowel compromise leading to severe enteritis is another differential consideration along with neoplasm. 3. Ascites and interloop fluid as described in the setting of marked enteritis. 4. Masslike area/collection contiguous with the RIGHT adnexa. 5. 7 mm RIGHT lower lobe pulmonary nodule without comparison is. Short interval follow-up is suggested in this patient to Fleischner society guidelines do not apply. Follow-up within 3 months or dedicated evaluation of the chest if neoplasm is discovered on further assessment. 6. Suggested hepatic steatosis without focal, suspicious hepatic lesion. 7. Aortic atherosclerosis. Aortic Atherosclerosis (ICD10-I70.0). Electronically Signed: By: Zetta Bills M.D. On: 10/01/2020 11:50    Procedures .Critical Care Performed by: Dorie Rank, MD Authorized by: Dorie Rank, MD   Critical care provider statement:    Critical care time (minutes):  30   Critical care was time spent personally by me on the following activities:  Discussions with consultants, evaluation of patient's response to treatment, examination of patient, ordering and performing treatments and interventions, ordering and review of laboratory studies, ordering and review of radiographic studies, pulse oximetry, re-evaluation of patient's condition, obtaining history from patient or surrogate and review of old charts     Medications Ordered in ED Medications  sodium chloride 0.9 % bolus 500 mL (0 mLs Intravenous Stopped 10/01/20 0944)    Followed by  0.9 %  sodium chloride infusion (1,000  mLs Intravenous New Bag/Given 10/01/20 0947)  cefTRIAXone (ROCEPHIN) 2 g in sodium chloride 0.9 % 100 mL IVPB (has no administration in time range)    And  metroNIDAZOLE (FLAGYL) IVPB 500 mg (has no administration in time range)  enoxaparin (LOVENOX) injection 40 mg (has no administration in time range)  dextrose 5 % and 0.45 % NaCl with KCl 20 mEq/L infusion (has no administration in time range)  cefTRIAXone (ROCEPHIN) 2 g in sodium chloride 0.9 % 100 mL IVPB (has no administration in time range)    And  metroNIDAZOLE (FLAGYL) IVPB 500 mg (has no administration in time range)  acetaminophen (TYLENOL) tablet 650 mg (has no administration in time range)  morphine 2 MG/ML injection 2 mg (has no administration in time range)  diphenhydrAMINE (BENADRYL) 12.5 MG/5ML elixir 12.5 mg (has no administration in time range)    Or  diphenhydrAMINE (BENADRYL) injection 12.5 mg (has no administration in time range)  ondansetron (ZOFRAN-ODT) disintegrating tablet 4 mg (has no administration in time range)    Or  ondansetron (ZOFRAN) injection 4 mg (has no administration in time range)  morphine 4 MG/ML injection 4 mg (4 mg Intravenous Given 10/01/20 0856)  ondansetron (ZOFRAN) injection 4 mg (4 mg Intravenous Given 10/01/20 0856)  iohexol (OMNIPAQUE) 300 MG/ML solution 100 mL (100 mLs Intravenous Contrast Given 10/01/20 1045)  ED Course  I have reviewed the triage vital signs and the nursing notes.  Pertinent labs & imaging results that were available during my care of the patient were reviewed by me and considered in my medical decision making (see chart for details).  Clinical Course as of 10/01/20 1356  Fri Oct 01, 2020  1024 Comprehensive metabolic panel(!) [JK]  99991111 CBC(!) [JK]  1025 Urinalysis, Routine w reflex microscopic Urine, Clean Catch(!) [JK]  1129 Discussed ct with Dr Jacalyn Lefevre. Diffuse severe enteritis as well as fluid collection.  Inflam bowel disease? ?fistula or bowel perforation.  Also  concerning for possible neoplasm.  Vessels look patent.  Normal colonoscopy 2021 [JK]    Clinical Course User Index [JK] Dorie Rank, MD   MDM Rules/Calculators/A&P                         Presentation concerning for the possibility of diverticulitis, colitis.  Appendicitis unlikely with her left lower quadrant abdominal pain.  No urinary symptoms.  Urinating etiology less likely.  We will check labs, anticipate may need CT scan.   Labs notable for elevated WBC.  CT scan shows complex abnormal findings.  Presentation atypical for ischemic bowel.  IBD a consideration although atypical at her age.  ?ovarian neoplasm.  Enteritis and fluid collection noted.  Pt started on IV abx.  Gen surg and medical service consulted for admission, further evaluation.  Final Clinical Impression(s) / ED Diagnoses Final diagnoses:  Abdominal fluid collection  Abdominal mass, unspecified abdominal location     Dorie Rank, MD 10/01/20 1356

## 2020-10-02 ENCOUNTER — Encounter (HOSPITAL_COMMUNITY): Payer: Self-pay

## 2020-10-02 ENCOUNTER — Inpatient Hospital Stay (HOSPITAL_COMMUNITY): Payer: PPO

## 2020-10-02 LAB — CBC
HCT: 33.2 % — ABNORMAL LOW (ref 36.0–46.0)
Hemoglobin: 10.1 g/dL — ABNORMAL LOW (ref 12.0–15.0)
MCH: 24.7 pg — ABNORMAL LOW (ref 26.0–34.0)
MCHC: 30.4 g/dL (ref 30.0–36.0)
MCV: 81.2 fL (ref 80.0–100.0)
Platelets: 413 K/uL — ABNORMAL HIGH (ref 150–400)
RBC: 4.09 MIL/uL (ref 3.87–5.11)
RDW: 16.6 % — ABNORMAL HIGH (ref 11.5–15.5)
WBC: 10 K/uL (ref 4.0–10.5)
nRBC: 0 % (ref 0.0–0.2)

## 2020-10-02 LAB — BASIC METABOLIC PANEL
Anion gap: 12 (ref 5–15)
BUN: 9 mg/dL (ref 8–23)
CO2: 23 mmol/L (ref 22–32)
Calcium: 8.4 mg/dL — ABNORMAL LOW (ref 8.9–10.3)
Chloride: 101 mmol/L (ref 98–111)
Creatinine, Ser: 0.77 mg/dL (ref 0.44–1.00)
GFR, Estimated: >60 mL/min (ref >60)
Glucose, Bld: 94 mg/dL (ref 70–99)
Potassium: 3.5 mmol/L (ref 3.5–5.1)
Sodium: 136 mmol/L (ref 135–145)

## 2020-10-02 LAB — MAGNESIUM: Magnesium: 1.8 mg/dL (ref 1.7–2.4)

## 2020-10-02 MED ORDER — IOHEXOL 300 MG/ML  SOLN
100.0000 mL | Freq: Once | INTRAMUSCULAR | Status: AC | PRN
  Administered 2020-10-02: 07:00:00 100 mL via INTRAVENOUS

## 2020-10-02 NOTE — Progress Notes (Signed)
colitis  Subjective: Pt tolerated oral contrast.  CT done.  No flatus or BM today.  No nausea  Objective: Vital signs in last 24 hours: Temp:  [98.1 F (36.7 C)-98.5 F (36.9 C)] 98.5 F (36.9 C) (01/29 0441) Pulse Rate:  [66-92] 72 (01/29 0441) Resp:  [13-41] 16 (01/29 0441) BP: (103-138)/(48-78) 120/76 (01/29 0441) SpO2:  [93 %-100 %] 95 % (01/29 0441) Weight:  [59 kg] 59 kg (01/28 0816) Last BM Date: 10/01/20  Intake/Output from previous day: 01/28 0701 - 01/29 0700 In: 2595.3 [P.O.:600; I.V.:1247.4; IV Piggyback:747.9] Out: -  Intake/Output this shift: No intake/output data recorded.  General appearance: alert and cooperative GI: soft, mildly distended  Lab Results:  Results for orders placed or performed during the hospital encounter of 10/01/20 (from the past 24 hour(s))  Lipase, blood     Status: None   Collection Time: 10/01/20  8:35 AM  Result Value Ref Range   Lipase 16 11 - 51 U/L  Comprehensive metabolic panel     Status: Abnormal   Collection Time: 10/01/20  8:35 AM  Result Value Ref Range   Sodium 136 135 - 145 mmol/L   Potassium 4.1 3.5 - 5.1 mmol/L   Chloride 104 98 - 111 mmol/L   CO2 21 (L) 22 - 32 mmol/L   Glucose, Bld 120 (H) 70 - 99 mg/dL   BUN 11 8 - 23 mg/dL   Creatinine, Ser 0.76 0.44 - 1.00 mg/dL   Calcium 8.7 (L) 8.9 - 10.3 mg/dL   Total Protein 6.7 6.5 - 8.1 g/dL   Albumin 2.9 (L) 3.5 - 5.0 g/dL   AST 14 (L) 15 - 41 U/L   ALT 17 0 - 44 U/L   Alkaline Phosphatase 86 38 - 126 U/L   Total Bilirubin 0.7 0.3 - 1.2 mg/dL   GFR, Estimated >60 >60 mL/min   Anion gap 11 5 - 15  CBC     Status: Abnormal   Collection Time: 10/01/20  8:35 AM  Result Value Ref Range   WBC 11.9 (H) 4.0 - 10.5 K/uL   RBC 4.19 3.87 - 5.11 MIL/uL   Hemoglobin 10.4 (L) 12.0 - 15.0 g/dL   HCT 34.0 (L) 36.0 - 46.0 %   MCV 81.1 80.0 - 100.0 fL   MCH 24.8 (L) 26.0 - 34.0 pg   MCHC 30.6 30.0 - 36.0 g/dL   RDW 16.7 (H) 11.5 - 15.5 %   Platelets 413 (H) 150 - 400 K/uL    nRBC 0.0 0.0 - 0.2 %  Urinalysis, Routine w reflex microscopic Urine, Clean Catch     Status: Abnormal   Collection Time: 10/01/20  8:35 AM  Result Value Ref Range   Color, Urine YELLOW YELLOW   APPearance CLEAR CLEAR   Specific Gravity, Urine 1.025 1.005 - 1.030   pH 6.0 5.0 - 8.0   Glucose, UA NEGATIVE NEGATIVE mg/dL   Hgb urine dipstick NEGATIVE NEGATIVE   Bilirubin Urine SMALL (A) NEGATIVE   Ketones, ur 15 (A) NEGATIVE mg/dL   Protein, ur TRACE (A) NEGATIVE mg/dL   Nitrite NEGATIVE NEGATIVE   Leukocytes,Ua TRACE (A) NEGATIVE  Urinalysis, Microscopic (reflex)     Status: Abnormal   Collection Time: 10/01/20  8:35 AM  Result Value Ref Range   RBC / HPF 0-5 0 - 5 RBC/hpf   WBC, UA 0-5 0 - 5 WBC/hpf   Bacteria, UA FEW (A) NONE SEEN   Squamous Epithelial / LPF 0-5 0 - 5  Urine-Other MICROSCOPIC EXAM PERFORMED ON UNCONCENTRATED URINE   SARS Coronavirus 2 by RT PCR (hospital order, performed in [facility] hospital lab) Nasopharyngeal Nasopharyngeal Swab     Status: None   Collection Time: 10/01/20 12:22 PM   Specimen: Nasopharyngeal Swab  Result Value Ref Range   SARS Coronavirus 2 NEGATIVE NEGATIVE  Basic metabolic panel     Status: Abnormal   Collection Time: 10/02/20  6:10 AM  Result Value Ref Range   Sodium 136 135 - 145 mmol/L   Potassium 3.5 3.5 - 5.1 mmol/L   Chloride 101 98 - 111 mmol/L   CO2 23 22 - 32 mmol/L   Glucose, Bld 94 70 - 99 mg/dL   BUN 9 8 - 23 mg/dL   Creatinine, Ser 0.77 0.44 - 1.00 mg/dL   Calcium 8.4 (L) 8.9 - 10.3 mg/dL   GFR, Estimated >60 >60 mL/min   Anion gap 12 5 - 15  Magnesium     Status: None   Collection Time: 10/02/20  6:10 AM  Result Value Ref Range   Magnesium 1.8 1.7 - 2.4 mg/dL  CBC     Status: Abnormal   Collection Time: 10/02/20  6:10 AM  Result Value Ref Range   WBC 10.0 4.0 - 10.5 K/uL   RBC 4.09 3.87 - 5.11 MIL/uL   Hemoglobin 10.1 (L) 12.0 - 15.0 g/dL   HCT 33.2 (L) 36.0 - 46.0 %   MCV 81.2 80.0 - 100.0 fL   MCH 24.7  (L) 26.0 - 34.0 pg   MCHC 30.4 30.0 - 36.0 g/dL   RDW 16.6 (H) 11.5 - 15.5 %   Platelets 413 (H) 150 - 400 K/uL   nRBC 0.0 0.0 - 0.2 %     Studies/Results Radiology     MEDS, Scheduled . [START ON 10/03/2020] levothyroxine  100 mcg Oral Once per day on Sun Mon Wed Fri  . levothyroxine  88 mcg Oral Once per day on Tue Thu Sat  . loratadine  10 mg Oral QPM  . pantoprazole  40 mg Oral Daily  . 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

## 2020-10-03 MED ORDER — SODIUM CHLORIDE 0.9 % IV SOLN
1000.0000 mL | INTRAVENOUS | Status: DC
  Administered 2020-10-03: 08:00:00 1000 mL via INTRAVENOUS

## 2020-10-03 NOTE — Progress Notes (Signed)
colitis  Subjective: Pt tolerating clears.  CT done.  Passing flatus, no BM yet.  No nausea  Objective: Vital signs in last 24 hours: Temp:  [97.5 F (36.4 C)-98.4 F (36.9 C)] 97.6 F (36.4 C) (01/30 0517) Pulse Rate:  [59-80] 64 (01/30 0517) Resp:  [15-18] 18 (01/30 0517) BP: (107-115)/(60-74) 115/68 (01/30 0517) SpO2:  [94 %-99 %] 97 % (01/30 0517) Last BM Date: 10/01/20  Intake/Output from previous day: 01/29 0701 - 01/30 0700 In: 3131.1 [P.O.:1270; I.V.:1527.4; IV Piggyback:333.7] Out: 1250 [Urine:1250] Intake/Output this shift: No intake/output data recorded.  General appearance: alert and cooperative GI: soft, mildly distended, min TTP  Lab Results:  No results found for this or any previous visit (from the past 24 hour(s)).   Studies/Results Radiology     MEDS, Scheduled . levothyroxine  100 mcg Oral Once per day on Sun Mon Wed Fri  . levothyroxine  88 mcg Oral Once per day on Tue Thu Sat  . loratadine  10 mg Oral QPM  . pantoprazole  40 mg Oral Daily  . 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

## 2020-10-04 ENCOUNTER — Other Ambulatory Visit: Payer: Self-pay

## 2020-10-04 ENCOUNTER — Inpatient Hospital Stay (HOSPITAL_COMMUNITY): Payer: PPO

## 2020-10-04 DIAGNOSIS — R1031 Right lower quadrant pain: Secondary | ICD-10-CM

## 2020-10-04 DIAGNOSIS — R933 Abnormal findings on diagnostic imaging of other parts of digestive tract: Secondary | ICD-10-CM

## 2020-10-04 DIAGNOSIS — K9289 Other specified diseases of the digestive system: Secondary | ICD-10-CM

## 2020-10-04 MED ORDER — TRAMADOL HCL 50 MG PO TABS: 50.0000 mg | ORAL_TABLET | Freq: Four times a day (QID) | ORAL | 0 refills | Status: DC | PRN

## 2020-10-04 MED ORDER — ENOXAPARIN SODIUM 40 MG/0.4ML ~~LOC~~ SOLN
40.0000 mg | SUBCUTANEOUS | Status: DC
  Administered 2020-10-04: 11:00:00 40 mg via SUBCUTANEOUS
  Filled 2020-10-04: qty 0.4

## 2020-10-04 MED ORDER — AMOXICILLIN-POT CLAVULANATE 875-125 MG PO TABS: 1.0000 | ORAL_TABLET | Freq: Two times a day (BID) | ORAL | 0 refills | Status: DC

## 2020-10-04 NOTE — Discharge Instructions (Addendum)
Take antibiotics as prescribed Continue full liquid diet at home Follow up as scheduled with gastroenterology Return to Emergency room if you develop worsening abdominal pain, nausea, vomiting, fever, chills...   Full Liquid Diet A full liquid diet refers to fluids and foods that are liquid, or will become liquid, at room temperature. This diet should only be used for a short period of time to help you recover from illness or surgery. Your health care provider or dietitian will help determine when it is safe to eat regular foods again. What are tips for following this plan? Reading food labels  Check food labels of nutrition shakes for the amount of protein. Look for nutrition shakes that have at least 8-10 grams of protein in each serving.  Choose drinks, such as milks and juices, that are "fortified" or "enriched." This means that vitamins and minerals have been added. Shopping  Buy pre-made nutrition shakes to keep on hand.  To vary your choices, buy different flavors of milks and shakes. Meal planning  Choose flavors and foods that you enjoy.  To make sure you get enough energy and calories from food: ? Have three full liquid meals each day. Have a liquid snack between each meal. ? Drink 6-8 oz (177-237 mL) of a nutritional supplement shake with meals or as snacks. ? Add protein powder, powdered milk, milk, or yogurt to shakes to increase the amount of protein.  Drink at least one serving a day of citrus fruit juice or fruit juice that has vitamin C added. General guidelines  Before starting the full liquid diet, check with your health care provider to know what foods you should avoid. These may include full-fat or high-fiber liquids.  You may have any liquid or food that becomes a liquid at room temperature. The food is considered a liquid if it can be poured off a spoon at room temperature.  Do not drink alcohol unless approved by your health care provider.  This diet gives  you most of the nutrients that you need for energy, but you may not get enough of certain vitamins, minerals, and fiber. Make sure to talk to your health care provider or dietitian about: ? How many calories you need to eat each day. ? How much fluid you should have each day. ? Taking a multivitamin or a nutritional supplement. What foods should I eat? Fruits Fruit juice without pulp. Strained fruit pures (seeds and skins removed). Vegetables Pulp-free tomato or vegetable juice. Vegetables pured in soup. Grains Thin, hot cereal, such as farina. Soft-cooked pasta or rice pured in soup. Meats and other proteins Beef, chicken, and fish broths. Powdered protein supplements. Dairy Milk and milk-based beverages, including milk shakes and instant breakfast mixes. Smooth yogurt. Pured cottage cheese. Fats and oils Melted margarine and butter. Cream. Canola, almond, avocado, corn, grapeseed, sunflower, and sesame oils. Gravy. Beverages Water. Coffee and tea (caffeinated or decaffeinated). Cocoa. Liquid nutritional supplements. Soft drinks. Nondairy milks, such as almond, coconut, rice, or soy milk. Sweets and desserts Custard. Pudding. Flavored gelatin. Smooth ice cream (without nuts or candy pieces). Sherbet. Frozen ice pops. [place] ice. Pudding pops. Seasonings and condiments Salt and pepper. Spices. Vinegar. Ketchup. Yellow mustard. Smooth sauces, such as Hollandaise, cheese sauce, or white sauce. Soy sauce. Syrup. Honey. Jelly (without fruit pieces). Other foods Cocoa powder. Cream soups. Strained soups. The items listed above may not be a complete list of foods and beverages you can eat. Contact a dietitian for more information.   What  foods should I avoid? Fruits All whole fresh, frozen, or canned fruits. Vegetables All whole fresh, frozen, or canned vegetables. Grains Whole grains. Pasta. Rice. Cold cereal. Bread. Crackers. Meats and other proteins All cuts of meat, poultry, and  fish. Eggs. Tofu and soy protein. Nuts and nut butters. Precooked or cured meat, such as sausages or meat loaves. Dairy Hard cheese. Yogurt with fruit chunks. Fats and oils Coconut oil. Palm oil. Lard. Cold butter. Sweets and desserts Ice cream or other frozen desserts that contain solids, such as nuts, chocolate chips, and pieces of cookies. Cakes. Cookies. Candy. Seasonings and condiments Stone-ground mustard. Other foods Soups with chunks or pieces. The items listed above may not be a complete list of foods and beverages you should avoid. Contact a dietitian for more information. Summary  A full liquid diet refers to fluids and foods that are liquid or will become liquid at room temperature.  This diet should only be used for a short period of time to help you recover from illness or surgery. Ask your health care provider or dietitian when it is safe for you to eat regular foods.  To make sure you get enough calories and nutrients, eat three meals each day with snacks in between. Drink pre-made nutritional supplement shakes or add protein powder to homemade shakes. Talk to your health care provider about taking a vitamin and mineral supplement. This information is not intended to replace advice given to you by your health care provider. Make sure you discuss any questions you have with your health care provider. Document Revised: 06/08/2020 Document Reviewed: 06/08/2020 Elsevier Patient Education  2021 [employer].

## 2020-10-04 NOTE — Progress Notes (Addendum)
Subjective: Feels bloated this morning.  Has eaten some of her soft diet, but not much.  Denies pain, but feels very sore like she has done a bunch of crunches.  ROS: See above, otherwise other systems negative  Objective: Vital signs in last 24 hours: Temp:  [97.9 F (36.6 C)-99.1 F (37.3 C)] 98.2 F (36.8 C) (01/31 0459) Pulse Rate:  [75-81] 75 (01/31 0459) Resp:  [14-17] 16 (01/31 0459) BP: (113-123)/(58-73) 122/73 (01/31 0459) SpO2:  [94 %-96 %] 94 % (01/31 0459) Last BM Date: 10/03/20  Intake/Output from previous day: 01/30 0701 - 01/31 0700 In: 1634.1 [P.O.:1110; I.V.:170; IV Piggyback:279.1] Out: 1200 [Urine:1200] Intake/Output this shift: No intake/output data recorded.  PE: Gen: NAD Heart: regular Lungs: CTAB Abd: soft, feels mildly bloated, tympanitic, +BS, mildly tender to palpation diffusely  Lab Results:  Recent Labs    10/02/20 0610  WBC 10.0  HGB 10.1*  HCT 33.2*  PLT 413*   BMET Recent Labs    10/02/20 0610  NA 136  K 3.5  CL 101  CO2 23  GLUCOSE 94  BUN 9  CREATININE 0.77  CALCIUM 8.4*   PT/INR No results for input(s): LABPROT, INR in the last 72 hours. CMP     Component Value Date/Time   NA 136 10/02/2020 0610   NA 138 12/19/2019 0903   NA 143 05/15/2014 0908   K 3.5 10/02/2020 0610   K 4.3 05/15/2014 0908   CL 101 10/02/2020 0610   CL 106 10/31/2012 0901   CO2 23 10/02/2020 0610   CO2 25 05/15/2014 0908   GLUCOSE 94 10/02/2020 0610   GLUCOSE 101 05/15/2014 0908   GLUCOSE 96 10/31/2012 0901   BUN 9 10/02/2020 0610   BUN 16 12/19/2019 0903   BUN 19.1 05/15/2014 0908   CREATININE 0.77 10/02/2020 0610   CREATININE 0.8 05/15/2014 0908   CALCIUM 8.4 (L) 10/02/2020 0610   CALCIUM 9.2 05/15/2014 0908   PROT 6.7 10/01/2020 0835   PROT 6.5 12/19/2019 0903   PROT 6.6 05/15/2014 0908   ALBUMIN 2.9 (L) 10/01/2020 0835   ALBUMIN 3.5 (L) 12/19/2019 0903   ALBUMIN 3.6 05/15/2014 0908   AST 14 (L) 10/01/2020 0835   AST 31  05/15/2014 0908   ALT 17 10/01/2020 0835   ALT 27 05/15/2014 0908   ALKPHOS 86 10/01/2020 0835   ALKPHOS 129 05/15/2014 0908   BILITOT 0.7 10/01/2020 0835   BILITOT 0.4 12/19/2019 0903   BILITOT 0.66 05/15/2014 0908   GFRNONAA >60 10/02/2020 0610   GFRAA 83 12/19/2019 0903   Lipase     Component Value Date/Time   LIPASE 16 10/01/2020 0835       Studies/Results: No results found.  Anti-infectives: Anti-infectives (From admission, onward)   Start     Dose/Rate Route Frequency Ordered Stop   10/02/20 1400  cefTRIAXone (ROCEPHIN) 2 g in sodium chloride 0.9 % 100 mL IVPB       "And" Linked Group Details   2 g 200 mL/hr over 30 Minutes Intravenous Every 24 hours 10/01/20 1354     10/01/20 2200  metroNIDAZOLE (FLAGYL) IVPB 500 mg       "And" Linked Group Details   500 mg 100 mL/hr over 60 Minutes Intravenous Every 8 hours 10/01/20 1354     10/01/20 1345  cefTRIAXone (ROCEPHIN) 2 g in sodium chloride 0.9 % 100 mL IVPB       "And" Linked Group Details   2 g  200 mL/hr over 30 Minutes Intravenous  Once 10/01/20 1337 10/01/20 1359   10/01/20 1345  metroNIDAZOLE (FLAGYL) IVPB 500 mg       "And" Linked Group Details   500 mg 100 mL/hr over 60 Minutes Intravenous  Once 10/01/20 1337 10/01/20 1726       Assessment/Plan Breast cancer, recent diagnosis Precancerous lingual lesion, removed in December at [facility]  Inflammatory cecal mass -unclear etiology -patient feels more bloated today after soft diet.  Had bowel dilatation on last scan.  Repeat film today. -is passing some flatus and had a BM yesterday after contrast given for CT scan -unclear if she is ready for DC yet.  Will get film and discuss with MD prior to further plans.   FEN - soft diet/IVFs VTE - Lovenox ID - rocephin/Flagyl  ADDENDUM: D/w MD, still with some mildly dilated small bowel on film, but contrast in colon.  Given increase bloating with soft diet, will ask GI to see her while she is here to consider  colonoscopy to better evaluate this area.  Do not want to send her home just to come right back with more obstructive type symptoms.  They will see her.  Appreciate their help.  We will reschedule her appointment with Dr. Marlou Starks.   LOS: 3 days    Henreitta Cea , [facility] 10/04/2020, 8:58 AM Please see Amion for pager number during day hours 7:00am-4:30pm or 7:00am -11:30am on weekends

## 2020-10-04 NOTE — Consult Note (Addendum)
Referring Provider:  [facility] Primary Care Physician:  Leonides Sake, MD Primary Gastroenterologist:    Harl Bowie, MD         We were asked to see this patient for:  Abnormal CT scan - RLQ mass                Attending physician's note   I have taken a history, examined the patient and reviewed the chart. I agree with the Advanced Practitioner's note, impression and recommendations.  75 year old very pleasant female admitted with acute abdominal pain.  CT abdomen pelvis showed complex thick-walled cyst in right lower quadrant with ?  Possible fistula between small bowel and proximal colon.  No definitive mass lesion or high-grade obstruction.  Unclear etiology for the complex cyst in right lower quadrant.  She is responding to IV antibiotics with improvement of abdominal pain.  Continue IV antibiotics for now, plan for total 14-day course of antibiotics  Continue oral liquids and small amount of soft diet as tolerated  If she has worsening abdominal distention or abdominal pain, will plan for repeat imaging during this hospitalization with CT enterography but if she has significant improvement we will plan for repeat imaging in 2 weeks on discharge  Will hold off colonoscopy for 4 to 6 weeks until acute inflammation subsides.  Less likely to be colonic malignancy given recent colonoscopy with good prep.  Cannot exclude small bowel neoplastic lesion but unclear if we can intubate small bowel given the large complex cyst in the right lower quadrant.   The patient was provided an opportunity to ask questions and all were answered. The patient agreed with the plan and demonstrated an understanding of the instructions.  Damaris Hippo , MD [phone]   ASSESSMENT / PLAN:   # 75 yo female with acute onset of abdominal pain and diarrhea with CT scan revealing irregular and thick walled structure in RLQ communicating with an isolated loop of small bowel and  proximal colon. Small bowel dilated without transition point. Etiology of findings unclear.  Perforated appendix? IBD seems less likely but not excluded.   --Her pain has definitely improved. She is tolerating a soft diet. No further diarrhea. Hopefully home tomorrow, or even later today.   -Recommend total of two weeks of antibiotics. Can transition to Augmentin upon discharge. Our office will arrange for repeat CT scan after completion of antibiotics. 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  female PMH significant for colon polyps, breast cancer, HTN, hyperlipidemia, asthma, hypothyroidism, hysterectomy  Patient presented to ED on 1/28 with crampy abdominal pain , nausea, and diarrhea. WBC mildly elevated.  CT scan w/ contrast remarkable for a RLQ thick walled structure  communicating with an isolated loop of bowel and proximal colon. Dilated small bowel without transition point. There may be an emanating nondilated appendix extending inferiorly towards the right deep inguinal ring  Patient says she developed severe generalized lower abdominal pain on Wed. Pain was associated with diarrhea which is unusual since she generally has constipation. She felt better after each episode of diarrhea. On Thursday she wasn't having much diarrhea but the abdominal pain was severe. No nausea /vomiting. No fevers. Up until Wed she was in her normal state of health.  Patient reports poor appetite lately. Had a tongue biopsy in November, had surgery and was not able to eat well during that  time.   PREVIOUS ENDOSCOPIC EVALUATIONS / PERTINENT STUDIES   Feb 2021 polyp surveillance colonoscopy -A 2 mm polyp was found in the cecum. The polyp was sessile. The polyp was removed with a cold biopsy forceps. Resection and retrieval were complete. - Two sessile polyps were found in the ascending colon. The polyps were 10 to 12 mm in size. These polyps were removed with a cold snare. Resection and retrieval were complete. - Non-bleeding internal hemorrhoids were found during retroflexion. The hemorrhoids were small. Surgical [P], ascending and cecum, polyp (3) - TUBULAR ADENOMA (X2 FRAGMENTS). - SESSILE SERRATED POLYP WITHOUT DYSPLASIA (X2 FRAGMENTS). - NO HIGH GRADE DYSPLASIA OR MALIGNANCY  10/01/20 CT scan w/ contrast IMPRESSION: 1. Irregular collection without discernible bowel wall in the area of the cecum in the RIGHT lower quadrant with complex fistulous network, associated with signs of severe enteritis and obstruction. 2. Findings could be related to severe sequela of inflammatory bowel disease based on appearance. Bowel compromise leading to severe enteritis is another differential consideration along with neoplasm. 3. Ascites and interloop fluid as described in the setting of marked enteritis. 4. Masslike area/collection contiguous with the RIGHT adnexa. 5. 7 mm RIGHT lower lobe pulmonary nodule without comparison is. Short interval follow-up is suggested in this patient to Fleischner society guidelines do not apply. Follow-up within 3 months or dedicated evaluation of the chest if neoplasm is discovered on further assessment. 6. Suggested hepatic steatosis without focal, suspicious hepatic lesion. 7. Aortic atherosclerosis.  10/02/20 CT scan w/ contrast IMPRESSION: 1. Uncertain anatomy in the right lower quadrant where the irregular and thick walled structure is again seen to communicate with an isolated loop of bowel and with the proximal colon. Necrotic mass and  complicated inflammatory bowel disease are again leading considerations. 2. Diffusely dilated bowel without transition point. 3. Small volume ascites. 4. 7 mm right lower lobe pulmonary nodule with recommendations provided yesterday.   Past Medical History:  Diagnosis Date  . Allergy   . Arthralgia 09/11/2011  . Asthma   . Breast cancer ([place]) 2011   RIGHT BREAST CA  . 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 Hyperlipidemia   . Hypertension   . Insomnia   . Thyroid disease    hypothyroidism    Past Surgical History:  Procedure Laterality Date  . ABDOMINAL HYSTERECTOMY  1975   partial  . BREAST BIOPSY Right 09/10/2013   BENIGN  . BREAST BIOPSY Left 09/07/2011   BENIGN  . BREAST LUMPECTOMY Right 2011  . CATARACT EXTRACTION, BILATERAL    .  COLONOSCOPY  2015  . COLONOSCOPY WITH PROPOFOL N/A 04/14/2014   Procedure: COLONOSCOPY WITH PROPOFOL;  Surgeon: Garlan Fair, MD;  Location: [facility] ENDOSCOPY;  Service: Endoscopy;  Laterality: N/A;  . POLYPECTOMY    . ROTATOR CUFF REPAIR Left 04/2013   Dr. Para March    Prior to Admission medications   Medication Sig Start Date End Date Taking? Authorizing Provider  albuterol (PROAIR HFA) 108 (90 Base) MCG/ACT inhaler Inhale 1-2 puffs into the lungs every 6 (six) hours as needed for wheezing or shortness of breath. 05/07/20  Yes Lauraine Rinne, NP  atorvastatin (LIPITOR) 10 MG tablet Take 1 tablet (10 mg total) by mouth at bedtime. 11/12/19  Yes Opalski, Neoma Laming, DO  calcium carbonate (OS-CAL - DOSED IN MG OF ELEMENTAL CALCIUM) 1250 (500 Ca) MG tablet Take 1 tablet by mouth daily.   Yes [provider]  clobetasol (TEMOVATE) 0.05 % external solution Apply 1 application topically daily as needed (Scalp irritation). 06/25/20  Yes [provider]  esomeprazole (NEXIUM) 20 MG capsule Take 20 mg by mouth daily at 12 noon.   Yes [provider]  ezetimibe (ZETIA) 10 MG tablet Take 1 tablet (10 mg total) by mouth  daily. 11/12/19  Yes Opalski, Deborah, DO  fluticasone (FLONASE) 50 MCG/ACT nasal spray PLACE 1 SPRAY INTO BOTH NOSTRILS DAILY. 09/16/20  Yes Lauraine Rinne, NP  fluticasone (FLOVENT HFA) 44 MCG/ACT inhaler Inhale 2 puffs into the lungs every 12 (twelve) hours. Patient taking differently: Inhale 2 puffs into the lungs 2 (two) times daily as needed (cough and asthma). 05/26/20  Yes Lauraine Rinne, NP  ibuprofen (ADVIL) 600 MG tablet Take 1 tablet (600 mg total) by mouth every 8 (eight) hours as needed. Patient taking differently: Take 600 mg by mouth every 8 (eight) hours as needed for mild pain. 09/09/19  Yes Opalski, Neoma Laming, DO  levocetirizine (XYZAL) 5 MG tablet Take 5 mg by mouth every evening.   Yes [provider]  levothyroxine (SYNTHROID) 100 MCG tablet TAKE 1 TABLET EVERY OTHER DAY ALTERNATING WITH 88 MCG TABLET Patient taking differently: Take 100 mcg by mouth See admin instructions. TAKE 100 MCG TABLET EVERY OTHER DAY ALTERNATING WITH 88 MCG TABLET 11/24/19  Yes Opalski, Neoma Laming, DO  levothyroxine (SYNTHROID) 88 MCG tablet * Patient taking differently: Take 88 mcg by mouth See admin instructions. TAKE 88 MCG TABLET EVERY OTHER DAY ALTERNATING WITH 100 MCG TABLET 08/21/19  Yes Opalski, Deborah, DO  losartan (COZAAR) 25 MG tablet Take 1 tablet (25 mg total) by mouth daily. 11/12/19  Yes Opalski, Deborah, DO  montelukast (SINGULAIR) 10 MG tablet * Patient taking differently: Take 10 mg by mouth at bedtime. 08/21/19  Yes Opalski, Deborah, DO  Multiple Vitamin (MULTIVITAMIN WITH MINERALS) TABS tablet Take 1 tablet by mouth daily.   Yes [provider]  omega-3 acid ethyl esters (LOVAZA) 1 g capsule TAKE 2 CAPSULES (2 G TOTAL) BY MOUTH 2 (TWO) TIMES DAILY. 11/13/19  Yes Opalski, Neoma Laming, DO  venlafaxine XR (EFFEXOR-XR) 75 MG 24 hr capsule TAKE 1 CAPSULE BY MOUTH EVERY DAY AT BEDTIME Patient taking differently: Take 75 mg by mouth daily. 06/11/20  Yes Causey, Charlestine Massed, NP   Vitamin D, Ergocalciferol, (DRISDOL) 1.25 MG (50000 UT) CAPS capsule TAKE 1 CAPSULE BY MOUTH ONCE WEEKLY Patient taking differently: Take 50,000 Units by mouth every Friday. 08/21/19  Yes Opalski, Neoma Laming, DO    Current Facility-Administered Medications  Medication Dose Route Frequency Provider Last Rate Last Admin  .  0.9 %  sodium chloride infusion  1,000 mL Intravenous Continuous Leighton Ruff, MD 10 mL/hr at 10/04/20 0900 Infusion Verify at 10/04/20 0900  . acetaminophen (TYLENOL) tablet 650 mg  650 mg Oral Q6H PRN Jill Alexanders, PA-C   650 mg at 10/03/20 2109  . cefTRIAXone (ROCEPHIN) 2 g in sodium chloride 0.9 % 100 mL IVPB  2 g Intravenous Q24H Jill Alexanders, PA-C 200 mL/hr at 10/04/20 1416 2 g at 10/04/20 1416   And  . metroNIDAZOLE (FLAGYL) IVPB 500 mg  500 mg Intravenous Q8H Jill Alexanders, PA-C 100 mL/hr at 10/04/20 0536 500 mg at 10/04/20 0536  . dextrose 5 %-0.45 % sodium chloride infusion   Intravenous Continuous Jill Alexanders, PA-C 75 mL/hr at 10/02/20 1800 Infusion Verify at 10/02/20 1800  . diphenhydrAMINE (BENADRYL) 12.5 MG/5ML elixir 12.5 mg  12.5 mg Oral Q6H PRN Jill Alexanders, PA-C       Or  . diphenhydrAMINE (BENADRYL) injection 12.5 mg  12.5 mg Intravenous Q6H PRN Simaan, Elizabeth S, PA-C      . enoxaparin (LOVENOX) injection 40 mg  40 mg Subcutaneous Q24H Saverio Danker, PA-C   40 mg at 10/04/20 1049  . levothyroxine (SYNTHROID) tablet 100 mcg  100 mcg Oral Once per day on Sun Mon Wed Fri Jill Alexanders, PA-C   100 mcg at 10/04/20 [id number]  . levothyroxine (SYNTHROID) tablet 88 mcg  88 mcg Oral Once per day on Tue Thu Sat Simaan, Elizabeth S, PA-C      . loratadine (CLARITIN) tablet 10 mg  10 mg Oral QPM Jill Alexanders, PA-C   10 mg at 10/03/20 1844  . metoprolol tartrate (LOPRESSOR) injection 5 mg  5 mg Intravenous Q6H PRN Jill Alexanders, PA-C      . morphine 2 MG/ML injection 2 mg  2 mg Intravenous Q2H PRN Jill Alexanders,  PA-C   2 mg at 10/02/20 [id number]  . ondansetron (ZOFRAN-ODT) disintegrating tablet 4 mg  4 mg Oral Q6H PRN Jill Alexanders, PA-C       Or  . ondansetron (ZOFRAN) injection 4 mg  4 mg Intravenous Q6H PRN Simaan, Elizabeth S, PA-C      . pantoprazole (PROTONIX) EC tablet 40 mg  40 mg Oral Daily Jill Alexanders, PA-C   40 mg at 10/04/20 0909  . traMADol (ULTRAM) tablet 50 mg  50 mg Oral Q6H PRN Jill Alexanders, PA-C   50 mg at 10/02/20 1222  . venlafaxine XR (EFFEXOR-XR) 24 hr capsule 75 mg  75 mg Oral Daily Jill Alexanders, PA-C   75 mg at 10/04/20 [id number]    Allergies as of 10/01/2020 - Review Complete 10/01/2020  Allergen Reaction Noted  . Advair diskus [fluticasone-salmeterol] Other (See Comments) 02/23/2015  . Neosporin [neomycin-polymyxin-gramicidin] Itching 02/07/2011    Family History  Problem Relation Age of Onset  . Cancer Mother        BREAST  . Cancer Father        pancreatic  . Cancer Sister        sebacous cell carcinoma  . Cancer Maternal Aunt        lung  . Cancer Paternal Uncle        colon, stomach  . Colon cancer Neg Hx   . Colon polyps Neg Hx   . Esophageal cancer Neg Hx   . Rectal cancer Neg Hx   . Stomach cancer Neg Hx     Social History  Socioeconomic History  . Marital status: Married    Spouse name: Not on file  . Number of children: Not on file  . Years of education: Not on file  . Highest education level: Not on file  Occupational History  . Not on file  Tobacco Use  . Smoking status: Former Smoker    Packs/day: 1.00    Years: 15.00    Pack years: 15.00    Types: Cigarettes    Quit date: 12/09/1978    Years since quitting: 41.8  . Smokeless tobacco: Never Used  Vaping Use  . Vaping Use: Never used  Substance and Sexual Activity  . Alcohol use: Yes    Alcohol/week: 7.0 standard drinks    Types: 7 Glasses of wine per week    Comment: SOCIALLY wine  . Drug use: No  . Sexual activity: Yes  Other Topics Concern  . Not on file   Social History Narrative  . Not on file   Social Determinants of Health   Financial Resource Strain: Not on file  Food Insecurity: Not on file  Transportation Needs: Not on file  Physical Activity: Not on file  Stress: Not on file  Social Connections: Not on file  Intimate Partner Violence: Not on file    Review of Systems: All systems reviewed and negative except where noted in HPI.    OBJECTIVE:    Physical Exam: Vital signs in last 24 hours: Temp:  [98.2 F (36.8 C)-99.1 F (37.3 C)] 98.2 F (36.8 C) (01/31 0459) Pulse Rate:  [75-81] 75 (01/31 0459) Resp:  [16-17] 16 (01/31 0459) BP: (122-123)/(73) 122/73 (01/31 0459) SpO2:  [94 %-96 %] 94 % (01/31 0459) Last BM Date: 10/03/20 General:   Alert  thin female in NAD Psych:  Pleasant, cooperative. Normal mood and affect. Eyes:  Pupils equal, sclera clear, no icterus.   Conjunctiva pink. Ears:  Normal auditory acuity. Nose:  No deformity, discharge,  or lesions. Neck:  Supple; no masses Lungs:  Clear throughout to auscultation.   No wheezes, crackles, or rhonchi.  Heart:  Regular rate and rhythm; no murmurs, no lower extremity edema Abdomen:  Soft, mildly distended and tympanitic. Active bowel sounds. mild diffuse lower abdominal tenderness > in RLQ Rectal:  Deferred  Msk:  Symmetrical without gross deformities. . Neurologic:  Alert and  oriented x4;  grossly normal neurologically. Skin:  Intact without significant lesions or rashes.  Filed Weights   10/01/20 0816  Weight: 59 kg     Scheduled inpatient medications . enoxaparin (LOVENOX) injection  40 mg Subcutaneous Q24H  . levothyroxine  100 mcg Oral Once per day on Sun Mon Wed Fri  . levothyroxine  88 mcg Oral Once per day on Tue Thu Sat  . loratadine  10 mg Oral QPM  . pantoprazole  40 mg Oral Daily  . venlafaxine XR  75 mg Oral Daily      Intake/Output from previous day: 01/30 0701 - 01/31 0700 In: 1634.1 [P.O.:1110; I.V.:170; IV  Piggyback:279.1] Out: 1200 [Urine:1200] Intake/Output this shift: Total I/O In: 120 [P.O.:120] Out: 100 [Urine:100]   Lab Results: Recent Labs    10/02/20 0610  WBC 10.0  HGB 10.1*  HCT 33.2*  PLT 413*   BMET Recent Labs    10/02/20 0610  NA 136  K 3.5  CL 101  CO2 23  GLUCOSE 94  BUN 9  CREATININE 0.77  CALCIUM 8.4*   . CBC Latest Ref Rng & Units 10/02/2020 10/01/2020 02/06/2019  WBC 4.0 - 10.5 K/uL 10.0 11.9(H) 5.0  Hemoglobin 12.0 - 15.0 g/dL 10.1(L) 10.4(L) 13.7  Hematocrit 36.0 - 46.0 % 33.2(L) 34.0(L) 41.3  Platelets 150 - 400 K/uL 413(H) 413(H) 322    . CMP Latest Ref Rng & Units 10/02/2020 10/01/2020 12/19/2019  Glucose 70 - 99 mg/dL 94 120(H) 87  BUN 8 - 23 mg/dL 9 11 16   Creatinine 0.44 - 1.00 mg/dL 0.77 0.76 0.80  Sodium 135 - 145 mmol/L 136 136 138  Potassium 3.5 - 5.1 mmol/L 3.5 4.1 4.3  Chloride 98 - 111 mmol/L 101 104 102  CO2 22 - 32 mmol/L 23 21(L) 25  Calcium 8.9 - 10.3 mg/dL 8.4(L) 8.7(L) 9.1  Total Protein 6.5 - 8.1 g/dL - 6.7 6.5  Total Bilirubin 0.3 - 1.2 mg/dL - 0.7 0.4  Alkaline Phos 38 - 126 U/L - 86 109  AST 15 - 41 U/L - 14(L) 16  ALT 0 - 44 U/L - 17 18   Studies/Results: DG Abd Portable 1V  Result Date: 10/04/2020 CLINICAL DATA:  Abdominal distention. EXAM: PORTABLE ABDOMEN - 1 VIEW COMPARISON:  Scout image from CT scan 10/02/2020. FINDINGS: Gaseous distention of small bowel in the left upper quadrant and central abdomen/pelvis is stable to even mildly progressed in the interval. Contrast material is visible in the right colon and region of the splenic flexure. IMPRESSION: Slight increase in central gaseous small bowel distension. Electronically Signed   By: Misty Briles M.D.   On: 10/04/2020 09:53    Active Problems:   Colitis, acute    Tye Savoy, NP-C @  10/04/2020, 2:40 PM

## 2020-10-04 NOTE — Plan of Care (Signed)
Reviewed d/c instructions w pt and husband and all questions answered. Pt states that she has all of her valuables w her. Discharge via w/c in stable condition.

## 2020-10-04 NOTE — Care Management Important Message (Signed)
Important Message  Patient Details IM Letter given to the Patient. Name: Catrena Vari MRN: [id number] Date of Birth: 1945/12/26   Medicare Important Message Given:  Yes     Kerin Salen 10/04/2020, 2:55 PM

## 2020-10-04 NOTE — Discharge Summary (Signed)
Patient ID: Lindsey Price [id number] 08-Aug-1946 75 y.o.  Admit date: 10/01/2020 Discharge date: 10/04/2020  Admitting Diagnosis: Ascending colitis with abnormal cecal process HTN HLD GERD DCIS  Discharge Diagnosis Patient Active Problem List   Diagnosis Date Noted  . Colitis, acute 10/01/2020  . Hoarseness, chronic 05/07/2020  . Flank pain 09/09/2019  . Insulin resistance/ pre-DM 08/21/2019  . Benign polyp of large intestine 08/21/2019  . Educated about COVID-19 virus infection 04/16/2019  . Chronic left hip pain 04/16/2019  . Piriformis muscle pain 04/16/2019  . Contact dermatitis 09/30/2018  . Traumatic complete tear of right rotator cuff 09/30/2018  . Chronic right shoulder pain 09/30/2018  . Muscle cramp, nocturnal 08/08/2018  . Low HDL (under 40) 04/04/2018  . Reactive airway disease 12/17/2017  . Vitamin D deficiency 04/12/2017  . Osteopenia 04/12/2017  . HTN (hypertension) 03/12/2017  . Hyperlipemia, mixed 03/12/2017  . Cataracts, bilateral- s/p surgery. 03/12/2017  . Hypothyroidism 03/12/2017  . Environmental and seasonal allergies 03/12/2017  . Adjustment disorder 03/12/2017  . GERD (gastroesophageal reflux disease) 03/12/2017  . Family history of multiple cancers 03/12/2017  . History of tobacco abuse 03/12/2017  . Allergic rhinitis 03/23/2015  . Cough variant asthma 02/23/2015  . Cancer of right breast, stage 0 09/11/2011  . Insomnia 09/11/2011  . Arthralgia 09/11/2011    Consultants Dr. Silverio Decamp, GI  Reason for Admission: [name] is a pleasant 75 y/o F with a PMH HTN, HLD, Hypothyroidism, allergies, and DCIS who presented to [facility] with a cc lower abdominal pain. Reports cramping lower abdominal pain that started on Wednesday evening, associated with diarrhea. Cramping pain would improve after bowel movements. This pain continued throughout Thursday and worsened, so she came to ED today (Friday 1/28) for evaluation. Patient denies  similar pain in the past. Associated sxs include mild nausea and decreased appetite. She denies fever, chills, vomiting, hematochezia, melena, or urinary sxs. She denies recent travel or trying new foods. She denies a personal history of abnormal colonoscopy (last scoped 10/2019) or inflammatory bowel disease. She reports a history of vaginal hysterectomy but denies any abdominal surgeries including appendectomy, cholecystectomy, bowel resection, stomach repair.   She is a former smoker who quit in the 1980's. She reports drinking alcohol socially, 1-2 glasses of wine, but not daily. Denies other drug use. She lives with her husband. Denies use of blood thinning medications.  Procedures none  Hospital Course:  The patient was admitted and started on IV abx therapy.  A repeat CT the following day with oral contrast was performed which continued to show an abnormal process in the RLQ concerning for mass vs inflammatory bowel disease.  Her diet was advanced and she got bloated on soft diet, but without nausea or emesis.  She is passing flatus and had a small bowel movement.  We backed her down to full liquids.  GI evaluated her and felt they didn't want to proceed with a colonoscopy at this time due to inflammation.  She will go home with a 2 week course of abx therapy and a repeat CT scan as an outpatient.  This was arranged and she was medically stable for DC home.  Allergies as of 10/04/2020      Reactions   Advair Diskus [fluticasone-salmeterol] Other (See Comments)   Hoarseness.   Neosporin [neomycin-polymyxin-gramicidin] Itching      Medication List    TAKE these medications   albuterol 108 (90 Base) MCG/ACT inhaler Commonly known as: ProAir HFA Inhale 1-2  puffs into the lungs every 6 (six) hours as needed for wheezing or shortness of breath.   amoxicillin-clavulanate 875-125 MG tablet Commonly known as: Augmentin Take 1 tablet by mouth 2 (two) times daily for 12 days.   atorvastatin 10  MG tablet Commonly known as: LIPITOR Take 1 tablet (10 mg total) by mouth at bedtime.   calcium carbonate 1250 (500 Ca) MG tablet Commonly known as: OS-CAL - dosed in mg of elemental calcium Take 1 tablet by mouth daily.   clobetasol 0.05 % external solution Commonly known as: TEMOVATE Apply 1 application topically daily as needed (Scalp irritation).   esomeprazole 20 MG capsule Commonly known as: NEXIUM Take 20 mg by mouth daily at 12 noon.   ezetimibe 10 MG tablet Commonly known as: Zetia Take 1 tablet (10 mg total) by mouth daily.   Flovent HFA 44 MCG/ACT inhaler Generic drug: fluticasone Inhale 2 puffs into the lungs every 12 (twelve) hours. What changed:   when to take this  reasons to take this   fluticasone 50 MCG/ACT nasal spray Commonly known as: FLONASE PLACE 1 SPRAY INTO BOTH NOSTRILS DAILY.   ibuprofen 600 MG tablet Commonly known as: ADVIL Take 1 tablet (600 mg total) by mouth every 8 (eight) hours as needed. What changed: reasons to take this   levocetirizine 5 MG tablet Commonly known as: XYZAL Take 5 mg by mouth every evening.   levothyroxine 88 MCG tablet Commonly known as: SYNTHROID * What changed:   how much to take  how to take this  when to take this  additional instructions   levothyroxine 100 MCG tablet Commonly known as: SYNTHROID TAKE 1 TABLET EVERY OTHER DAY ALTERNATING WITH 88 MCG TABLET What changed:   how much to take  how to take this  when to take this  additional instructions   losartan 25 MG tablet Commonly known as: COZAAR Take 1 tablet (25 mg total) by mouth daily.   montelukast 10 MG tablet Commonly known as: SINGULAIR * What changed:   how much to take  how to take this  when to take this  additional instructions   multivitamin with minerals Tabs tablet Take 1 tablet by mouth daily.   omega-3 acid ethyl esters 1 g capsule Commonly known as: LOVAZA TAKE 2 CAPSULES (2 G TOTAL) BY MOUTH 2 (TWO)  TIMES DAILY.   traMADol 50 MG tablet Commonly known as: ULTRAM Take 1 tablet (50 mg total) by mouth every 6 (six) hours as needed (mild pain).   venlafaxine XR 75 MG 24 hr capsule Commonly known as: EFFEXOR-XR TAKE 1 CAPSULE BY MOUTH EVERY DAY AT BEDTIME What changed: See the new instructions.   Vitamin D (Ergocalciferol) 1.25 MG (50000 UNIT) Caps capsule Commonly known as: DRISDOL TAKE 1 CAPSULE BY MOUTH ONCE WEEKLY What changed:   how much to take  how to take this  when to take this  additional instructions         Follow-up Information    Jovita Kussmaul, MD. Go on 10/05/2020.   Specialty: General Surgery Contact information: [address] [place] [address] [phone]        Mauri Pole, MD. Schedule an appointment as soon as possible for a visit in 2 week(s).   Specialty: Gastroenterology Contact information: [place] [id number] [phone]               Signed: Saverio Danker, [facility] 10/04/2020, 4:30  PM Please see Amion for pager number during day hours 7:00am-4:30pm, 7-11:30am on Weekends

## 2020-10-05 ENCOUNTER — Ambulatory Visit: Payer: Self-pay | Admitting: General Surgery

## 2020-10-05 ENCOUNTER — Telehealth: Payer: Self-pay | Admitting: Gastroenterology

## 2020-10-05 DIAGNOSIS — C50412 Malignant neoplasm of upper-outer quadrant of left female breast: Secondary | ICD-10-CM | POA: Diagnosis not present

## 2020-10-05 DIAGNOSIS — Z6822 Body mass index (BMI) 22.0-22.9, adult: Secondary | ICD-10-CM | POA: Diagnosis not present

## 2020-10-05 DIAGNOSIS — Z79899 Other long term (current) drug therapy: Secondary | ICD-10-CM | POA: Diagnosis not present

## 2020-10-05 DIAGNOSIS — Z170 Estrogen receptor positive status [ER+]: Secondary | ICD-10-CM

## 2020-10-05 DIAGNOSIS — I808 Phlebitis and thrombophlebitis of other sites: Secondary | ICD-10-CM | POA: Diagnosis not present

## 2020-10-05 DIAGNOSIS — Z7689 Persons encountering health services in other specified circumstances: Secondary | ICD-10-CM | POA: Diagnosis not present

## 2020-10-05 DIAGNOSIS — R1903 Right lower quadrant abdominal swelling, mass and lump: Secondary | ICD-10-CM | POA: Diagnosis not present

## 2020-10-05 DIAGNOSIS — R079 Chest pain, unspecified: Secondary | ICD-10-CM | POA: Diagnosis not present

## 2020-10-05 NOTE — Telephone Encounter (Signed)
Confirmed with the pharmacist. Patient is aware. Follow up was scheduled before she was discharged from the hospital. Patient confirms this as well. She will come pick up the instructions and contrast for the CT before the appointment of 10/21/20.

## 2020-10-05 NOTE — Telephone Encounter (Signed)
Yes fine to switch to Cipro 500mg  BID  and Flagyl 500mg  TID, please send Rx for 10 days. Please bring her in for follow up visit in 2 weeks with me or APP. Thanks

## 2020-10-05 NOTE — Telephone Encounter (Signed)
Spoke with the patient. She had a spell of her heart racing last night. It is better now. She feels the Augmentin caused this and it also makes her stomach "burn." Dr Marlou Starks called in Cipro and Flagyl. He asked her to clear it with you before changing to these antibiotics.  Please advise.

## 2020-10-05 NOTE — Telephone Encounter (Signed)
Inbound call from patient requesting a call back from a nurse please.  Did not specify further. 

## 2020-10-06 ENCOUNTER — Other Ambulatory Visit: Payer: Self-pay

## 2020-10-06 MED ORDER — FAMOTIDINE 20 MG PO TABS: 20.0000 mg | ORAL_TABLET | Freq: Two times a day (BID) | ORAL | 1 refills | Status: DC | PRN

## 2020-10-06 NOTE — Addendum Note (Signed)
Addended by: Virgina Evener A on: 10/06/2020 11:15 AM   Modules accepted: Orders

## 2020-10-06 NOTE — Telephone Encounter (Signed)
Discussed with the patient. Agrees to this plan of care.

## 2020-10-06 NOTE — Telephone Encounter (Signed)
Please send rx for Pepcid 20mg  BID PRN. Continue with small meals and antireflux measures. Thanks

## 2020-10-06 NOTE — Telephone Encounter (Signed)
She is calling with complaints of indigestion. No reflux. Taking maximum of Gaviscon. Plans to sleep in her recliner tonight due to the indigestion and the pressure it creates. What do you recommend for her symptoms. Also of note, she had an EKG yesterday and was told it was okay.

## 2020-10-06 NOTE — Telephone Encounter (Signed)
Pt is requesting a call back from a nurse regarding questions she has, pt did not disclose any further info,

## 2020-10-07 ENCOUNTER — Other Ambulatory Visit: Payer: Self-pay | Admitting: General Surgery

## 2020-10-07 DIAGNOSIS — C50412 Malignant neoplasm of upper-outer quadrant of left female breast: Secondary | ICD-10-CM

## 2020-10-07 DIAGNOSIS — Z170 Estrogen receptor positive status [ER+]: Secondary | ICD-10-CM

## 2020-10-08 ENCOUNTER — Ambulatory Visit: Payer: PPO | Admitting: Pulmonary Disease

## 2020-10-11 NOTE — Progress Notes (Signed)
New Breast Cancer Diagnosis: Left Breast-UOQ  Did patient present with symptoms (if so, please note symptoms) or screening mammography?:Screening Mass    Location and Extent of disease : Left breast 1:00 o'clock. Located at [facility], measured 0.7cm in greatest dimension. Palpable lymph node in the left axilla per Dr. Marlou Starks note.  Histology per Pathology Report: grade 1-2, Invasive Ductal Carcinoma  Receptor Status: ER(>95%positive), PR (85%positive), Her2-neu (negative), Ki-67(10%)  Surgeon and surgical plan, if any: Dr. Marlou Starks 10/05/2020 -She was recently diagnosed with an 8 mm invasive cancer in the upper outer quadrant of the left breast with clinically negative nodes. -I have discussed treatments with her in detail and at this point she favors breast conservation. -She is also a good candidate for sentinel node biopsy. -I will go ahead and refer her to medical and radiation oncology to discuss adjuvant therapy. -She will also follow up with her Gastroenterologist and one of our colorectal surgeons for some recent infection in the abdomen. -Left Lumpectomy with radioactive seed and SLN biopsy 11/05/2020  Medical oncologist, treatment if any:   Dr. Lindi Adie 10/15/2020 12:15 pm  Lymphedema issues, if any:  no left   Pain issues, if any:  no   SAFETY ISSUES:  Prior radiation? 50 Gy to the right breast, 60 Gy to the tumor bed.  Completed 12/17/2009, 6 weeks of treatment.  Pacemaker/ICD? no  Possible current pregnancy? no, Postmenopausal  Is the patient on methotrexate? 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  She was on antiestrogen for 5 years. +/+ 1.8 cm (-) node

## 2020-10-12 ENCOUNTER — Ambulatory Visit
Admission: RE | Admit: 2020-10-12 | Discharge: 2020-10-12 | Disposition: A | Discharge status: Home / Self Care | Payer: PPO | Source: Ambulatory Visit | Attending: Radiation Oncology | Admitting: Radiation Oncology

## 2020-10-12 ENCOUNTER — Encounter: Payer: Self-pay | Admitting: Radiation Oncology

## 2020-10-12 DIAGNOSIS — Z170 Estrogen receptor positive status [ER+]: Secondary | ICD-10-CM | POA: Diagnosis not present

## 2020-10-12 DIAGNOSIS — C50412 Malignant neoplasm of upper-outer quadrant of left female breast: Secondary | ICD-10-CM | POA: Diagnosis not present

## 2020-10-12 DIAGNOSIS — C50912 Malignant neoplasm of unspecified site of left female breast: Secondary | ICD-10-CM | POA: Insufficient documentation

## 2020-10-12 NOTE — Progress Notes (Signed)
Radiation Oncology         [phone] ________________________________  Initial Outpatient Consultation - Conducted via telephone due to current COVID-19 concerns for limiting patient exposure  I spoke with the patient to conduct this consult visit via telephone to spare the patient unnecessary potential exposure in the healthcare setting during the current COVID-19 pandemic. The patient was notified in advance and was offered a [employer] meeting to allow for face to face communication but unfortunately reported that they did not have the appropriate resources/technology to support such a visit and instead preferred to proceed with a telephone consult.    Name: Lindsey Price        MRN: [id number]  Date of Service: 10/12/2020 DOB: 12-Nov-1945  QQ:[id number], Lorin Mercy, MD  Jovita Kussmaul, MD     REFERRING PHYSICIAN: Autumn Messing III, MD   DIAGNOSIS: The encounter diagnosis was Malignant neoplasm of upper-outer quadrant of left breast in female, estrogen receptor positive ([place]).   HISTORY OF PRESENT ILLNESS: Lindsey Price is a 75 y.o. female  with a newly diagnosed left-sided breast cancer seen today at the request of Dr. Marlou Starks.  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  Her cancer was ER/PR positive.  It is not clear if she was offered radiotherapy, but continued for approximately 5 years on antiestrogen medication.  More recently, she was found on mammogram to have a suspicious abnormality within the left breast corresponding to single mass. On ultrasound, a .7 cm tumor was located at the 1:00 position. Ultrasound of the axilla revealed no adenopathy. A biopsy revealed a grade 1-2 IDC. The cancer was estrogen receptor positive, progesterone receptor positive, and Her-2/neu negative. The patient is contacted today to discuss treatment recommendations for her cancer.  She is scheduled to undergo left lumpectomy  with sentinel lymph node biopsy on 11/05/2020. Of note she was just discharged last week from the hospital with colitis and has been taking antibiotics and plans to have repeat CT of the abdomen next week.   PREVIOUS RADIATION THERAPY: Yes  Completed 12/17/09: 50 Gy to the right breast, 60 Gy to the tumor bed with [facility]   PAST MEDICAL HISTORY:  Past Medical History:  Diagnosis Date  . Allergy   . Arthralgia 09/11/2011  . Asthma   . Breast cancer ([place]) 2011   RIGHT BREAST CA  . 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 Hyperlipidemia   . Hypertension   . Insomnia   . Thyroid disease    hypothyroidism       PAST SURGICAL HISTORY: Past Surgical History:  Procedure Laterality Date  . ABDOMINAL HYSTERECTOMY  1975   partial  . BREAST BIOPSY Right 09/10/2013   BENIGN  . BREAST BIOPSY Left 09/07/2011   BENIGN  . BREAST LUMPECTOMY Right 2011  . CATARACT EXTRACTION, BILATERAL    . COLONOSCOPY  2015  . COLONOSCOPY WITH PROPOFOL N/A 04/14/2014   Procedure: COLONOSCOPY WITH PROPOFOL;  Surgeon: Garlan Fair, MD;  Location: [facility];  Service: Endoscopy;  Laterality: N/A;  . POLYPECTOMY    . ROTATOR CUFF REPAIR Left 04/2013   Dr. Para March     FAMILY HISTORY:  Family History  Problem Relation Age of Onset  . Cancer Mother        BREAST  . Cancer Father        pancreatic  . Cancer Sister  sebacous cell carcinoma  . Cancer Maternal Aunt        lung  . Cancer Paternal Uncle        colon, stomach  . Colon cancer Neg Hx   . Colon polyps Neg Hx   . Esophageal cancer Neg Hx   . Rectal cancer Neg Hx   . Stomach cancer Neg Hx      SOCIAL HISTORY:  reports that she quit smoking about 41 years ago. Her smoking use included cigarettes. She has a 15.00 pack-year smoking history. She has never used smokeless tobacco. She reports current alcohol use of about 7.0 standard drinks of alcohol per week. She reports that she does not use drugs. 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 She has two adult sons who live nearby.    ALLERGIES: Advair diskus [fluticasone-salmeterol] and Neosporin [neomycin-polymyxin-gramicidin]   MEDICATIONS:  Current Outpatient Medications  Medication Sig Dispense Refill  . albuterol (PROAIR HFA) 108 (90 Base) MCG/ACT inhaler Inhale 1-2 puffs into the lungs every 6 (six) hours as needed for wheezing or shortness of breath. 8 g 5  . atorvastatin (LIPITOR) 10 MG tablet Take 1 tablet (10 mg total) by mouth at bedtime. 90 tablet 0  . calcium carbonate (OS-CAL - DOSED IN MG OF ELEMENTAL CALCIUM) 1250 (500 Ca) MG tablet Take 1 tablet by mouth daily.    . ciprofloxacin (CIPRO) 500 MG tablet Take 1 tablet (500 mg total) by mouth 2 (two) times daily. 20 tablet 0  . clobetasol (TEMOVATE) 0.05 % external solution Apply 1 application topically daily as needed (Scalp irritation).    Marland Kitchen esomeprazole (NEXIUM) 20 MG capsule Take 20 mg by mouth daily at 12 noon.    . ezetimibe (ZETIA) 10 MG tablet Take 1 tablet (10 mg total) by mouth daily. 90 tablet 3  . famotidine (PEPCID) 20 MG tablet Take 1 tablet (20 mg total) by mouth 2 (two) times daily as needed for heartburn or indigestion. 60 tablet 1  . fluticasone (FLONASE) 50 MCG/ACT nasal spray PLACE 1 SPRAY INTO BOTH NOSTRILS DAILY. 48 mL 1  . fluticasone (FLOVENT HFA) 44 MCG/ACT inhaler Inhale 2 puffs into the lungs every 12 (twelve) hours. (Patient taking differently: Inhale 2 puffs into the lungs 2 (two) times daily as needed (cough and asthma).) 1 each 3  . ibuprofen (ADVIL) 600 MG tablet Take 1 tablet (600 mg total) by mouth every 8 (eight) hours as needed. (Patient taking differently: Take 600 mg by mouth every 8 (eight) hours as needed for mild pain.) 30 tablet 0  . levocetirizine (XYZAL) 5 MG tablet Take 5 mg by mouth every evening.    Marland Kitchen levothyroxine (SYNTHROID) 100 MCG tablet TAKE 1 TABLET EVERY OTHER DAY ALTERNATING WITH 88 MCG TABLET (Patient taking  differently: Take 100 mcg by mouth See admin instructions. TAKE 100 MCG TABLET EVERY OTHER DAY ALTERNATING WITH 88 MCG TABLET) 45 tablet 1  . levothyroxine (SYNTHROID) 88 MCG tablet * (Patient taking differently: Take 88 mcg by mouth See admin instructions. TAKE 88 MCG TABLET EVERY OTHER DAY ALTERNATING WITH 100 MCG TABLET) 45 tablet 1  . losartan (COZAAR) 25 MG tablet Take 1 tablet (25 mg total) by mouth daily. 90 tablet 0  . metroNIDAZOLE (FLAGYL) 500 MG tablet Take 1 tablet (500 mg total) by mouth 3 (three) times daily. 30 tablet 0  . montelukast (SINGULAIR) 10 MG tablet * (Patient taking differently: Take 10 mg by mouth at bedtime.) 90 tablet 1  .  Multiple Vitamin (MULTIVITAMIN WITH MINERALS) TABS tablet Take 1 tablet by mouth daily.    Marland Kitchen omega-3 acid ethyl esters (LOVAZA) 1 g capsule TAKE 2 CAPSULES (2 G TOTAL) BY MOUTH 2 (TWO) TIMES DAILY. 360 capsule 0  . traMADol (ULTRAM) 50 MG tablet Take 1 tablet (50 mg total) by mouth every 6 (six) hours as needed (mild pain). 10 tablet 0  . venlafaxine XR (EFFEXOR-XR) 75 MG 24 hr capsule TAKE 1 CAPSULE BY MOUTH EVERY DAY AT BEDTIME (Patient taking differently: Take 75 mg by mouth daily.) 90 capsule 4  . Vitamin D, Ergocalciferol, (DRISDOL) 1.25 MG (50000 UT) CAPS capsule TAKE 1 CAPSULE BY MOUTH ONCE WEEKLY (Patient taking differently: Take 50,000 Units by mouth every Friday.) 12 capsule 1   No current facility-administered medications for this encounter.     REVIEW OF SYSTEMS: On review of systems, the patient reports that she is doing well overall. She reports her abdominal pain continues to improve. She has had some more normal bowel habits in the last few days. She denies any concerns with her breast at this time.    PHYSICAL EXAM:  Wt Readings from Last 3 Encounters:  10/12/20 140 lb (63.5 kg)  10/01/20 130 lb (59 kg)  06/09/20 139 lb 3.2 oz (63.1 kg)   Unable to assess due to encounter type.   ECOG = 1  0 - Asymptomatic (Fully active,  able to carry on all predisease activities without restriction)  1 - Symptomatic but completely ambulatory (Restricted in physically strenuous activity but ambulatory and able to carry out work of a light or sedentary nature. For example, light housework, office work)  2 - Symptomatic, <50% in bed during the day (Ambulatory and capable of all self care but unable to carry out any work activities. Up and about more than 50% of waking hours)  3 - Symptomatic, >50% in bed, but not bedbound (Capable of only limited self-care, confined to bed or chair 50% or more of waking hours)  4 - Bedbound (Completely disabled. Cannot carry on any self-care. Totally confined to bed or chair)  5 - Death   Lindsey Price MM, Creech RH, Tormey DC, et al. [phone]). "Toxicity and response criteria of the [facility]". [place] Oncol. 5 (6): 649-55    LABORATORY DATA:  Lab Results  Component Value Date   WBC 10.0 10/02/2020   HGB 10.1 (L) 10/02/2020   HCT 33.2 (L) 10/02/2020   MCV 81.2 10/02/2020   PLT 413 (H) 10/02/2020   Lab Results  Component Value Date   NA 136 10/02/2020   K 3.5 10/02/2020   CL 101 10/02/2020   CO2 23 10/02/2020   Lab Results  Component Value Date   ALT 17 10/01/2020   AST 14 (L) 10/01/2020   ALKPHOS 86 10/01/2020   BILITOT 0.7 10/01/2020      RADIOGRAPHY: CT ABDOMEN PELVIS W CONTRAST  Result Date: 10/02/2020 CLINICAL DATA:  Abdominal pain EXAM: CT ABDOMEN AND PELVIS WITH CONTRAST TECHNIQUE: Multidetector CT imaging of the abdomen and pelvis was performed using the standard protocol following bolus administration of intravenous contrast. CONTRAST:  118mL OMNIPAQUE IOHEXOL 300 MG/ML  SOLN COMPARISON:  Yesterday FINDINGS: Lower chest: Atelectasis and trace pleural effusions at the bases. Pulmonary nodule in the right lower lobe measuring 7 mm. Postoperative right breast. Hepatobiliary: Trace perihepatic ascitesno evidence of biliary obstruction or stone.  Pancreas: Unremarkable. Spleen: Unremarkable. Adrenals/Urinary Tract: Negative adrenals. No hydronephrosis or stone. Unremarkable bladder. Stomach/Bowel: Uncertain  anatomy in the right lower quadrant. Presumed terminal ileum drains into the proximal colon with a large collection extending inferiorly which readily fills with enteric contrast, showing thick and irregular wall and being contiguous with a loop of seemingly blind ending small-bowel. The pocket/mass measures up to 9 cm. There may be an emanating nondilated appendix extending inferiorly towards the right deep inguinal ring. Small bowel loops are dilated but no high-grade obstruction as enteric contrast reaches the non collapsed proximal colon. Vascular/Lymphatic: Prominent ileocolic lymph nodes related to the above. No acute vascular finding. Mild atherosclerosis. Reproductive:Hysterectomy. Other: Small volume ascites considered reactive to the above. No detected peritoneal nodularity. Musculoskeletal: No acute abnormalities. IMPRESSION: 1. Uncertain anatomy in the right lower quadrant where the irregular and thick walled structure is again seen to communicate with an isolated loop of bowel and with the proximal colon. Necrotic mass and complicated inflammatory bowel disease are again leading considerations. 2. Diffusely dilated bowel without transition point. 3. Small volume ascites. 4. 7 mm right lower lobe pulmonary nodule with recommendations provided yesterday. Electronically Signed   By: Monte Fantasia M.D.   On: 10/02/2020 07:23   CT ABDOMEN PELVIS W CONTRAST  Addendum Date: 10/01/2020   ADDENDUM REPORT: 10/01/2020 11:53 ADDENDUM: These results were called by telephone at the time of interpretation on 10/01/2020 at 11:53 am to provider [facility] , who verbally acknowledged these results. Electronically Signed   By: Zetta Bills M.D.   On: 10/01/2020 11:53   Result Date: 10/01/2020 CLINICAL DATA:  Suspected diverticulitis in a patient with  history of abdominal pain and diarrhea for 2 days EXAM: CT ABDOMEN AND PELVIS WITH CONTRAST TECHNIQUE: Multidetector CT imaging of the abdomen and pelvis was performed using the standard protocol following bolus administration of intravenous contrast. CONTRAST:  171mL OMNIPAQUE IOHEXOL 300 MG/ML  SOLN COMPARISON:  No priors are available for comparison. FINDINGS: Lower chest: Basilar atelectasis. No dense consolidation. No sign of pleural effusion. Small pulmonary nodule RIGHT lower lobe (image seven, series 6) 7 mm. Hepatobiliary: Suggested hepatic steatosis without focal, suspicious hepatic lesion. Portal vein is patent. SMV is patent. No hyperenhancement of the gallbladder wall. Fluid near the gallbladder but mainly localized to the perihepatic space and RIGHT pericolic gutter about inflammatory changes involving bowel in the RIGHT hemiabdomen. No biliary duct dilation. Pancreas: Normal, without mass, inflammation or ductal dilatation. Spleen: Normal Adrenals/Urinary Tract: Normal appearance of the adrenal glands. Symmetric renal enhancement. No signs of hydronephrosis. Smooth renal contours. Stomach/Bowel: Stomach under distended limiting assessment. No acute gastric process. Mural stratification of numerous small bowel loops beginning in the distal jejunum and extending through the ileum. Irregular collection without discernible bowel wall in the RIGHT lower quadrant in communication with colon and the ileum at 3 levels. No normal ileocecal valve could be demonstrated. Mural stratification is present within bowel loops leading into this collection. The collection measures 8.4 x 5.4 cm in greatest axial dimension an approximately 9 cm greatest craniocaudal span. There is abundant surrounding stranding and irregular peripheral enhancement of this area. Bowel upstream and around this showing variable areas of dilation and collapse with mural stratification. A C-shaped loop of ileum in the RIGHT lower quadrant best  seen on image 71 of series 2 communicates at 2 levels, along the inferior and anterior margin of the collection. A second loop of bowel communicates along the anterolateral margin cephalad to these other areas of communication along the superior margin of the collection. Long segment mural stratification involving the distal jejunum  and ileum is present with areas of suspected "skip type lesions" as well. There is no free air. Interloop fluid is present in the upper abdomen in the LEFT upper quadrant and in the lower abdomen around inflamed bowel loops and is associated with extensive mesenteric edema. Colon is collapsed. Vascular/Lymphatic: Aortic atherosclerosis both calcified and noncalcified. No aneurysmal dilation. Mesenteric vessels including the SMV or grossly patent. No mesenteric adenopathy by size criteria with numerous small lymph nodes however in the RIGHT lower quadrant mesentery. Irregular margin of the area of concern in the RIGHT lower quadrant best seen on coronal images with areas measuring up to 11 mm along the margin of this collection. Reproductive: Post hysterectomy. Masslike area/collection contiguous with the RIGHT adnexa. Other: Small volume ascites both in the pelvis, LEFT hemiabdomen and along the RIGHT hemi liver. Musculoskeletal: No acute or destructive bone process. Spinal degenerative changes. IMPRESSION: 1. Irregular collection without discernible bowel wall in the area of the cecum in the RIGHT lower quadrant with complex fistulous network, associated with signs of severe enteritis and obstruction. 2. Findings could be related to severe sequela of inflammatory bowel disease based on appearance. Bowel compromise leading to severe enteritis is another differential consideration along with neoplasm. 3. Ascites and interloop fluid as described in the setting of marked enteritis. 4. Masslike area/collection contiguous with the RIGHT adnexa. 5. 7 mm RIGHT lower lobe pulmonary nodule  without comparison is. Short interval follow-up is suggested in this patient to Fleischner society guidelines do not apply. Follow-up within 3 months or dedicated evaluation of the chest if neoplasm is discovered on further assessment. 6. Suggested hepatic steatosis without focal, suspicious hepatic lesion. 7. Aortic atherosclerosis. Aortic Atherosclerosis (ICD10-I70.0). Electronically Signed: By: Zetta Bills M.D. On: 10/01/2020 11:50   DG Abd Portable 1V  Result Date: 10/04/2020 CLINICAL DATA:  Abdominal distention. EXAM: PORTABLE ABDOMEN - 1 VIEW COMPARISON:  Scout image from CT scan 10/02/2020. FINDINGS: Gaseous distention of small bowel in the left upper quadrant and central abdomen/pelvis is stable to even mildly progressed in the interval. Contrast material is visible in the right colon and region of the splenic flexure. IMPRESSION: Slight increase in central gaseous small bowel distension. Electronically Signed   By: Misty Aycock M.D.   On: 10/04/2020 09:53   US BREAST LTD UNI LEFT INC AXILLA  Result Date: 09/27/2020 CLINICAL DATA:  75 year old female for annual follow-up. History of RIGHT breast cancer and lumpectomy. The patient requests a diagnostic study. EXAM: DIGITAL DIAGNOSTIC BILATERAL MAMMOGRAM WITH CAD AND TOMOSYNTHESIS ULTRASOUND LEFT BREAST TECHNIQUE: Bilateral digital diagnostic mammography and breast tomosynthesis was performed. Digital images of the breasts were evaluated with computer-aided detection. Targeted ultrasound examination of the breast was performed. COMPARISON:  Previous exam(s). ACR Breast Density Category b: There are scattered areas of fibroglandular density. FINDINGS: 2D/3D full field views of both breasts demonstrate a new irregular mass within the posterior UPPER-OUTER LEFT breast. No other suspicious findings are noted within either breast. Lumpectomy changes within the RIGHT breast and COIL biopsy clip within the OUTER LEFT breast are again noted. Targeted  ultrasound is performed, showing a 0.7 x 0.7 x 0.8 cm irregular hypoechoic mass at the 1 o'clock position of the LEFT breast 6 cm from the nipple. No abnormal LEFT axillary lymph nodes are identified. IMPRESSION: 1. Suspicious 0.8 cm UPPER OUTER LEFT breast mass. Tissue sampling is recommended. 2. No abnormal appearing LEFT axillary lymph nodes. 3. RIGHT breast lumpectomy changes. No suspicious RIGHT breast findings. RECOMMENDATION: Ultrasound-guided LEFT breast  biopsy, which will be arranged. I have discussed the findings and recommendations with the patient. If applicable, a reminder letter will be sent to the patient regarding the next appointment. BI-RADS CATEGORY  4: Suspicious. Electronically Signed   By: Margarette Canada M.D.   On: 09/27/2020 10:15   MM DIAG BREAST TOMO BILATERAL  Result Date: 09/27/2020 CLINICAL DATA:  75 year old female for annual follow-up. History of RIGHT breast cancer and lumpectomy. The patient requests a diagnostic study. EXAM: DIGITAL DIAGNOSTIC BILATERAL MAMMOGRAM WITH CAD AND TOMOSYNTHESIS ULTRASOUND LEFT BREAST TECHNIQUE: Bilateral digital diagnostic mammography and breast tomosynthesis was performed. Digital images of the breasts were evaluated with computer-aided detection. Targeted ultrasound examination of the breast was performed. COMPARISON:  Previous exam(s). ACR Breast Density Category b: There are scattered areas of fibroglandular density. FINDINGS: 2D/3D full field views of both breasts demonstrate a new irregular mass within the posterior UPPER-OUTER LEFT breast. No other suspicious findings are noted within either breast. Lumpectomy changes within the RIGHT breast and COIL biopsy clip within the OUTER LEFT breast are again noted. Targeted ultrasound is performed, showing a 0.7 x 0.7 x 0.8 cm irregular hypoechoic mass at the 1 o'clock position of the LEFT breast 6 cm from the nipple. No abnormal LEFT axillary lymph nodes are identified. IMPRESSION: 1. Suspicious 0.8 cm  UPPER OUTER LEFT breast mass. Tissue sampling is recommended. 2. No abnormal appearing LEFT axillary lymph nodes. 3. RIGHT breast lumpectomy changes. No suspicious RIGHT breast findings. RECOMMENDATION: Ultrasound-guided LEFT breast biopsy, which will be arranged. I have discussed the findings and recommendations with the patient. If applicable, a reminder letter will be sent to the patient regarding the next appointment. BI-RADS CATEGORY  4: Suspicious. Electronically Signed   By: Margarette Canada M.D.   On: 09/27/2020 10:15   MM CLIP PLACEMENT LEFT  Result Date: 09/27/2020 CLINICAL DATA:  Status post ultrasound-guided core biopsy of the left breast. EXAM: 3D DIAGNOSTIC LEFT MAMMOGRAM POST ULTRASOUND BIOPSY COMPARISON:  Previous exam(s). FINDINGS: 3D Mammographic images were obtained following ultrasound guided biopsy of a mass in the 1 o'clock region of the left breast. The biopsy marking clip is in the upper-outer quadrant of the left breast. IMPRESSION: Appropriate positioning of the ribbon shaped biopsy marking clip at the site of biopsy in the upper-outer quadrant of the left breast. Final Assessment: Post Procedure Mammograms for Marker Placement Electronically Signed   By: Lillia Mountain M.D.   On: 09/27/2020 11:00   [place] LT BREAST BX W LOC DEV 1ST LESION IMG BX SPEC US GUIDE  Addendum Date: 09/29/2020   ADDENDUM REPORT: 09/28/2020 15:40 ADDENDUM: Pathology revealed GRADE I-II INVASIVE DUCTAL CARCINOMA, DUCTAL CARCINOMA IN SITU of the Left breast, 1:00 o'clock. This was found to be concordant by Dr. Lillia Mountain. Pathology results were discussed with the patient by telephone. The patient reported doing well after the biopsy with tenderness at the site. Post biopsy instructions and care were reviewed and questions were answered. The patient was encouraged to call [facility] for any additional concerns. My direct phone number was provided. Surgical consultation has been arranged with  Dr. Autumn Messing at [facility] on October 05, 2020. Pathology results reported by Terie Purser, RN on 09/28/2020. Electronically Signed   By: Lillia Mountain M.D.   On: 09/28/2020 15:40   Result Date: 09/29/2020 CLINICAL DATA:  Left breast mass. EXAM: ULTRASOUND GUIDED LEFT BREAST CORE NEEDLE BIOPSY COMPARISON:  Previous exam(s). PROCEDURE: I met with the patient and  we discussed the procedure of ultrasound-guided biopsy, including benefits and alternatives. We discussed the high likelihood of a successful procedure. We discussed the risks of the procedure, including infection, bleeding, tissue injury, clip migration, and inadequate sampling. Informed written consent was given. The usual time-out protocol was performed immediately prior to the procedure. Lesion quadrant: Upper-outer quadrant Using sterile technique and 1% Lidocaine as local anesthetic, under direct ultrasound visualization, a 14 gauge spring-loaded device was used to perform biopsy of a mass in the 1 o'clock region of the left breast using a lateral to medial approach. At the conclusion of the procedure ribbon shaped tissue marker clip was deployed into the biopsy cavity. Follow up 2 view mammogram was performed and dictated separately. IMPRESSION: Ultrasound guided biopsy of the left breast. No apparent complications. Electronically Signed: By: Lillia Mountain M.D. On: 09/27/2020 10:51       IMPRESSION/PLAN: 1. Stage IA, [id number] grade 1-2, ER/PR positive invasive ductal carcinoma of the left breast. Dr. Lisbeth Renshaw discusses the pathology findings and reviews the nature of early left breast disease. The consensus from the breast conference includes breast conservation with lumpectomy with sentinel node biopsy. Depending on the size of the final tumor measurements rendered by pathology, the tumor may be tested for Oncotype Dx score to determine a role for systemic therapy. Provided that chemotherapy is not indicated, the patient's course would  then be followed by external radiotherapy to the breast  to reduce risks of local recurrence followed by antiestrogen therapy. While radiation may be optional, she is very motivated to continue on an aggressive treatment path and desires radiation. We discussed the risks, benefits, short, and long term effects of radiotherapy, as well as the curative intent, and the patient is interested in proceeding. Dr. Lisbeth Renshaw discusses the delivery and logistics of radiotherapy and anticipates a course of 4 weeks of radiotherapy. We will see her back a few weeks after surgery to discuss the simulation process and anticipate we starting radiotherapy about 4-6 weeks after surgery.  2. Remote history of High Grade ER/PR positive DCIS of the right breast. The patient will continue in surveillance at the same time as she moves into surveillance following treatment for #1.  3. Colitis. The patient clinically is doing better since going home from the hospital and we will follow up with her expectantly and she will follow up with PCP and general surgery after her CT scan next week.  Given current concerns for patient exposure during the COVID-19 pandemic, this encounter was conducted via telephone.  The patient has provided two factor identification and has given verbal consent for this type of encounter and has been advised to only accept a meeting of this type in a secure network environment. The time spent during this encounter was 60 minutes including preparation, discussion, and coordination of the patient's care. The attendants for this meeting include Blenda Nicely, RN, Dr. Lisbeth Renshaw, Hayden Pedro  and [name].  During the encounter,  Blenda Nicely, RN, Dr. Lisbeth Renshaw, and Hayden Pedro were located at [facility].  Lindsey Price was located at home.   The above documentation reflects my direct findings during this shared patient visit. Please see the  separate note by Dr. Lisbeth Renshaw on this date for the remainder of the patient's plan of care.    Carola Rhine, PAC

## 2020-10-14 NOTE — Progress Notes (Signed)
Patient Care Team: Hamrick, Lorin Mercy, MD as PCP - General (Family Medicine) Juanito Doom, MD as Consulting Physician (Pulmonary Disease) Elsie Saas, MD as Consulting Physician (Orthopedic Surgery) Allyn Kenner, MD as Consulting Physician (Dermatology) Katy Apo, MD as Consulting Physician (Ophthalmology) Jovita Kussmaul, MD as Consulting Physician (General Surgery) Nicholas Lose, MD as Consulting Physician (Hematology and Oncology) Garlan Fair, MD as Consulting Physician (Gastroenterology) Delice Bison, Charlestine Massed, NP as Nurse Practitioner (Hematology and Oncology)  DIAGNOSIS:    ICD-10-CM   1. 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 Biopsy showed invasive and in situ ductal carcinoma, HER-2 equivocal by IHC (2+), negative by FISH (ratio 1.59), ER+ >95%, PR+ 85%, Ki67 10%.    Malignant neoplasm of left breast in female, estrogen receptor positive ([place])  10/12/2020 Initial Diagnosis   Malignant neoplasm of left breast in female, estrogen receptor positive ([place])   10/12/2020 Cancer Staging   Staging form: Breast, AJCC 8th Edition - Clinical stage from 10/12/2020: Stage IA (cT1b, cN0, cM0, G2, ER+, PR+, HER2-) - Signed by Nicholas Lose, MD on 10/15/2020 Stage prefix: Initial diagnosis     CHIEF COMPLIANT: Newly diagnosed Left breast cancer  INTERVAL HISTORY: Lindsey Price is a 75  y.o. with above-mentioned history of right breast DCIS treated with lumpectomy and 5 years of antiestrogen therapy with Aromasin and was on surveillance. Mammogram on 09/27/20 showed a 0.8cm upper outer left breast mass. Biopsy on 09/27/20 showed invasive and in situ ductal carcinoma, HER-2 equivocal by IHC (2+), negative by FISH (ratio 1.59), ER+ >95%, PR+ 85%, Ki67 10%. She presents to the clinic today to discuss treatment options.   ALLERGIES:  is allergic to advair diskus [fluticasone-salmeterol] and neosporin [neomycin-polymyxin-gramicidin].  MEDICATIONS:  Current Outpatient Medications  Medication Sig Dispense Refill   albuterol (PROAIR HFA) 108 (90 Base) MCG/ACT inhaler Inhale 1-2 puffs into the lungs every 6 (six) hours as needed for wheezing or shortness of breath. 8 g 5   atorvastatin (LIPITOR) 10 MG tablet Take 1 tablet (10 mg total) by mouth at bedtime. 90 tablet 0   calcium carbonate (OS-CAL - DOSED IN MG OF ELEMENTAL CALCIUM) 1250 (500 Ca) MG tablet Take 1 tablet by mouth daily.     ciprofloxacin (CIPRO) 500 MG tablet Take 1 tablet (500 mg total) by mouth 2 (two) times daily. 20 tablet 0   clobetasol (TEMOVATE) 0.05 % external solution Apply 1 application topically daily as needed (Scalp irritation).     esomeprazole (NEXIUM) 20 MG capsule Take 20 mg by mouth daily at 12 noon.     ezetimibe (ZETIA) 10 MG tablet Take 1 tablet (10 mg total) by mouth daily. 90 tablet 3   famotidine (PEPCID) 20 MG tablet Take 1 tablet (20 mg total) by mouth 2 (two) times daily as needed for heartburn or indigestion. 60 tablet 1   fluticasone (FLONASE) 50  MCG/ACT nasal spray PLACE 1 SPRAY INTO BOTH NOSTRILS DAILY. 48 mL 1   fluticasone (FLOVENT HFA) 44 MCG/ACT inhaler Inhale 2 puffs into the lungs every 12 (twelve) hours. (Patient taking differently: Inhale 2 puffs into the lungs 2 (two) times daily as needed (cough and asthma).) 1 each 3   ibuprofen (ADVIL) 600 MG tablet Take 1 tablet (600 mg  total) by mouth every 8 (eight) hours as needed. (Patient taking differently: Take 600 mg by mouth every 8 (eight) hours as needed for mild pain.) 30 tablet 0   levocetirizine (XYZAL) 5 MG tablet Take 5 mg by mouth every evening.     levothyroxine (SYNTHROID) 100 MCG tablet TAKE 1 TABLET EVERY OTHER DAY ALTERNATING WITH 88 MCG TABLET (Patient taking differently: Take 100 mcg by mouth See admin instructions. TAKE 100 MCG TABLET EVERY OTHER DAY ALTERNATING WITH 88 MCG TABLET) 45 tablet 1   levothyroxine (SYNTHROID) 88 MCG tablet * (Patient taking differently: Take 88 mcg by mouth See admin instructions. TAKE 88 MCG TABLET EVERY OTHER DAY ALTERNATING WITH 100 MCG TABLET) 45 tablet 1   losartan (COZAAR) 25 MG tablet Take 1 tablet (25 mg total) by mouth daily. 90 tablet 0   metroNIDAZOLE (FLAGYL) 500 MG tablet Take 1 tablet (500 mg total) by mouth 3 (three) times daily. 30 tablet 0   montelukast (SINGULAIR) 10 MG tablet * (Patient taking differently: Take 10 mg by mouth at bedtime.) 90 tablet 1   Multiple Vitamin (MULTIVITAMIN WITH MINERALS) TABS tablet Take 1 tablet by mouth daily.     omega-3 acid ethyl esters (LOVAZA) 1 g capsule TAKE 2 CAPSULES (2 G TOTAL) BY MOUTH 2 (TWO) TIMES DAILY. 360 capsule 0   traMADol (ULTRAM) 50 MG tablet Take 1 tablet (50 mg total) by mouth every 6 (six) hours as needed (mild pain). 10 tablet 0   venlafaxine XR (EFFEXOR-XR) 75 MG 24 hr capsule TAKE 1 CAPSULE BY MOUTH EVERY DAY AT BEDTIME (Patient taking differently: Take 75 mg by mouth daily.) 90 capsule 4   Vitamin D, Ergocalciferol, (DRISDOL) 1.25 MG (50000 UT) CAPS capsule TAKE 1 CAPSULE BY MOUTH ONCE WEEKLY (Patient taking differently: Take 50,000 Units by mouth every Friday.) 12 capsule 1   No current facility-administered medications for this visit.    PHYSICAL EXAMINATION: ECOG PERFORMANCE STATUS: 1 - Symptomatic but completely ambulatory  Vitals:   10/15/20 1206  BP: (!) 122/54  Pulse: 90  Resp: 17   Temp: 97.9 F (36.6 C)  SpO2: 97%   Filed Weights   10/15/20 1206  Weight: 134 lb 1.6 oz (60.8 kg)    LABORATORY DATA:  I have reviewed the data as listed CMP Latest Ref Rng & Units 10/02/2020 10/01/2020 12/19/2019  Glucose 70 - 99 mg/dL 94 120(H) 87  BUN 8 - 23 mg/dL _0 Creatinine 0.44 - 1.00 mg/dL 0.77 0.76 0.80  Sodium 135 - 145 mmol/L 136 136 138  Potassium 3.5 - 5.1 mmol/L 3.5 4.1 4.3  Chloride 98 - 111 mmol/L 101 104 102  CO2 22 - 32 mmol/L 23 21(L) 25  Calcium 8.9 - 10.3 mg/dL 8.4(L) 8.7(L) 9.1  Total Protein 6.5 - 8.1 g/dL - 6.7 6.5  Total Bilirubin 0.3 - 1.2 mg/dL - 0.7 0.4  Alkaline Phos 38 - 126 U/L - 86 109  AST 15 - 41 U/L - 14(L) 16  ALT 0 - 44 U/L - 17 18    Lab Results  Component Value Date   WBC  10.0 10/02/2020   HGB 10.1 (L) 10/02/2020   HCT 33.2 (L) 10/02/2020   MCV 81.2 10/02/2020   PLT 413 (H) 10/02/2020   NEUTROABS 2.8 02/06/2019    ASSESSMENT & PLAN:  Malignant neoplasm of left breast in female, estrogen receptor positive ([place]) Scr mammogram detected left breast mass On ultrasound, a .7 cm tumor was located at the 1:00 position. Ultrasound of the axilla revealed no adenopathy. A biopsy revealed a grade 1-2 IDC ER >95%, PR 85%, Ki 67: 10%, HER-2 Neg  (H/O Right breast high-grade DCIS diagnosed 09/13/2009 ER 100% PR 93%: completed 5 years of anti est therapy April 2016.)  Pathology and radiology counseling:Discussed with the patient, the details of pathology including the type of breast cancer,the clinical staging, the significance of ER, PR and HER-2/neu receptors and the implications for treatment. After reviewing the pathology in detail, we proceeded to discuss the different treatment options between surgery, radiation, chemotherapy, antiestrogen therapies.  Recommendations: 1. Breast conserving surgery followed by 2. Adjuvant radiation therapy followed by 3. Adjuvant antiestrogen therapy Genetics  If final path is similar, I don't  intend to get oncotype test.  Return to clinic after surgery to discuss final pathology report      No orders of the defined types were placed in this encounter.  The patient has a good understanding of the overall plan. she agrees with it. she will call with any problems that may develop before the next visit here.  Total time spent: 30 mins including face to face time and time spent for planning, charting and coordination of care  Rulon Eisenmenger, MD, MPH 10/15/2020  I, Molly Dorshimer, am acting as scribe for Dr. Nicholas Lose.  I have reviewed the above documentation for accuracy and completeness, and I agree with the above.

## 2020-10-15 ENCOUNTER — Inpatient Hospital Stay: Payer: PPO | Attending: Hematology and Oncology | Admitting: Hematology and Oncology

## 2020-10-15 ENCOUNTER — Other Ambulatory Visit: Payer: Self-pay

## 2020-10-15 ENCOUNTER — Telehealth: Payer: Self-pay | Admitting: Hematology and Oncology

## 2020-10-15 DIAGNOSIS — C50412 Malignant neoplasm of upper-outer quadrant of left female breast: Secondary | ICD-10-CM | POA: Insufficient documentation

## 2020-10-15 DIAGNOSIS — Z170 Estrogen receptor positive status [ER+]: Secondary | ICD-10-CM | POA: Diagnosis not present

## 2020-10-15 DIAGNOSIS — Z86000 Personal history of in-situ neoplasm of breast: Secondary | ICD-10-CM | POA: Diagnosis not present

## 2020-10-15 NOTE — Assessment & Plan Note (Addendum)
Scr mammogram detected left breast mass On ultrasound, a .7 cm tumor was located at the 1:00 position. Ultrasound of the axilla revealed no adenopathy. A biopsy revealed a grade 1-2 IDC ER >95%, PR 85%, Ki 67: 10%, HER-2 Neg  (H/O Right breast high-grade DCIS diagnosed 09/13/2009 ER 100% PR 93%: completed 5 years of anti est therapy April 2016.)  Pathology and radiology counseling:Discussed with the patient, the details of pathology including the type of breast cancer,the clinical staging, the significance of ER, PR and HER-2/neu receptors and the implications for treatment. After reviewing the pathology in detail, we proceeded to discuss the different treatment options between surgery, radiation, chemotherapy, antiestrogen therapies.  Recommendations: 1. Breast conserving surgery followed by 2. Adjuvant radiation therapy followed by 3. Adjuvant antiestrogen therapy Genetics  Return to clinic after surgery to discuss final pathology report

## 2020-10-15 NOTE — Telephone Encounter (Signed)
Scheduled appts per 2/11 los. Gave pt a print out of AVS.

## 2020-10-18 ENCOUNTER — Telehealth: Payer: Self-pay | Admitting: Gastroenterology

## 2020-10-18 NOTE — Telephone Encounter (Signed)
Patient has completed a 10 day course of Cipro and Flagyl. She had extra Flagyl from her other doctor. She will not continue the Flagyl since she has already been on it for 10 days. Her diet is still liquid. She states she feels much better but is very tired of Boost and other protein drinks. No abdominal pain. No fevers. She has a CT scan of her abdomen and pelvis with contrast on 10/21/20. She will try soft food like mashed potatoes, scrambled egg or white bread in very small amount to see how she feels.

## 2020-10-18 NOTE — Telephone Encounter (Signed)
Inbound call from patient requesting a call back from a nurse please.  Has questions about CT scan and some medications she is taking.

## 2020-10-21 ENCOUNTER — Ambulatory Visit (HOSPITAL_COMMUNITY)
Admission: RE | Admit: 2020-10-21 | Discharge: 2020-10-21 | Disposition: A | Discharge status: Home / Self Care | Payer: PPO | Source: Ambulatory Visit | Attending: Nurse Practitioner | Admitting: Nurse Practitioner

## 2020-10-21 ENCOUNTER — Other Ambulatory Visit: Payer: Self-pay

## 2020-10-21 DIAGNOSIS — R1031 Right lower quadrant pain: Secondary | ICD-10-CM | POA: Diagnosis not present

## 2020-10-21 DIAGNOSIS — R109 Unspecified abdominal pain: Secondary | ICD-10-CM | POA: Diagnosis not present

## 2020-10-21 MED ORDER — IOHEXOL 300 MG/ML  SOLN
100.0000 mL | Freq: Once | INTRAMUSCULAR | Status: AC | PRN
  Administered 2020-10-21: 100 mL via INTRAVENOUS

## 2020-10-22 ENCOUNTER — Encounter: Payer: Self-pay | Admitting: Pulmonary Disease

## 2020-10-22 ENCOUNTER — Ambulatory Visit: Payer: PPO | Admitting: Pulmonary Disease

## 2020-10-22 VITALS — BP 100/60 | HR 100 | Temp 97.5°F | Ht 67.0 in | Wt 130.2 lb

## 2020-10-22 DIAGNOSIS — J45991 Cough variant asthma: Secondary | ICD-10-CM | POA: Diagnosis not present

## 2020-10-22 NOTE — Telephone Encounter (Signed)
Spoke with the [facility] Radiologist. Difficulty with the system yesterday. Images did not upload until today. The radiologist hopes to have the report by today. Called the patient. She is tolerating soft foods. She is "doing okay." Anxious to be better. Aware of the delay in the written CT report. She agrees to continue soft diet until told otherwise.

## 2020-10-22 NOTE — Progress Notes (Signed)
Synopsis: Referred in 2016 for Cough. Former patient of Dr. Lake Bells with Cough variant asthma.  Subjective:   PATIENT ID: Lindsey Price GENDER: female DOB: 18-Apr-1946, MRN: [id number]   HPI  Chief Complaint  Patient presents with  . Consult    Patient was in the hospital 10/01/20. Patient has some cough but states that its about gone    Lindsey Price is a 75 year old woman, former smoker with history of GERD and cough variant asthma who returns to pulmonary clinic for follow up of cough variant asthma.   At last visit 05/07/20 she was encouraged to take her PPI prior to breakfast and Qvar was reduced to 22mcg from 55mcg. Her insurance would cover fluticasone 8mcg inhaler which she has rarely uses. She has been using as needed albuterol with good relief of her cough when it has acted up.   She continues on flonase nasal spray daily, levocetirizine 5mg  daily and montelukast daily for her allergies. Denies nasal and sinus congestion.  She is taking esomeprazole 20mg  daily prior to breakfast. Denies heart burn symptoms.   Past Medical History:  Diagnosis Date  . Allergy   . Arthralgia 09/11/2011  . Asthma   . Breast cancer ([place]) 2011   RIGHT BREAST CA  . 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 Hyperlipidemia   . Hypertension   . Insomnia   . Thyroid disease    hypothyroidism     Family History  Problem Relation Age of Onset  . Cancer Mother        BREAST  . Cancer Father        pancreatic  . Cancer Sister        sebacous cell carcinoma  . Cancer Maternal Aunt        lung  . Cancer Paternal Uncle        colon, stomach  . Colon cancer Neg Hx   . Colon polyps Neg Hx   . Esophageal cancer Neg Hx   . Rectal cancer Neg Hx   . Stomach cancer Neg Hx      Social History   Socioeconomic History  . Marital status: Married    Spouse name: Not on file  . Number of children: Not on file  . Years of education: Not on file  . Highest education level:  Not on file  Occupational History  . Not on file  Tobacco Use  . Smoking status: Former Smoker    Packs/day: 1.00    Years: 15.00    Pack years: 15.00    Types: Cigarettes    Quit date: 12/09/1978    Years since quitting: 41.8  . Smokeless tobacco: Never Used  Vaping Use  . Vaping Use: Never used  Substance and Sexual Activity  . Alcohol use: Yes    Alcohol/week: 7.0 standard drinks    Types: 7 Glasses of wine per week    Comment: SOCIALLY wine  . Drug use: No  . Sexual activity: Yes  Other Topics Concern  . Not on file  Social History Narrative  . Not on file   Social Determinants of Health   Financial Resource Strain: Not on file  Food Insecurity: Not on file  Transportation Needs: Not on file  Physical Activity: Not on file  Stress: Not on file  Social Connections: Not on file  Intimate Partner Violence: Not At Risk  . Fear of Current or Ex-Partner: No  . Emotionally Abused: No  .  Physically Abused: No  . Sexually Abused: No     Allergies  Allergen Reactions  . Advair Diskus [Fluticasone-Salmeterol] Other (See Comments)    Hoarseness.  . Neosporin [Neomycin-Polymyxin-Gramicidin] Itching     Outpatient Medications Prior to Visit  Medication Sig Dispense Refill  . albuterol (PROAIR HFA) 108 (90 Base) MCG/ACT inhaler Inhale 1-2 puffs into the lungs every 6 (six) hours as needed for wheezing or shortness of breath. 8 g 5  . atorvastatin (LIPITOR) 10 MG tablet Take 1 tablet (10 mg total) by mouth at bedtime. 90 tablet 0  . calcium carbonate (OS-CAL - DOSED IN MG OF ELEMENTAL CALCIUM) 1250 (500 Ca) MG tablet Take 1 tablet by mouth daily.    . ciprofloxacin (CIPRO) 500 MG tablet Take 1 tablet (500 mg total) by mouth 2 (two) times daily. 20 tablet 0  . clobetasol (TEMOVATE) 0.05 % external solution Apply 1 application topically daily as needed (Scalp irritation).    Marland Kitchen esomeprazole (NEXIUM) 20 MG capsule Take 20 mg by mouth daily at 12 noon.    . ezetimibe (ZETIA) 10  MG tablet Take 1 tablet (10 mg total) by mouth daily. 90 tablet 3  . famotidine (PEPCID) 20 MG tablet Take 1 tablet (20 mg total) by mouth 2 (two) times daily as needed for heartburn or indigestion. 60 tablet 1  . fluticasone (FLONASE) 50 MCG/ACT nasal spray PLACE 1 SPRAY INTO BOTH NOSTRILS DAILY. 48 mL 1  . fluticasone (FLOVENT HFA) 44 MCG/ACT inhaler Inhale 2 puffs into the lungs every 12 (twelve) hours. (Patient taking differently: Inhale 2 puffs into the lungs 2 (two) times daily as needed (cough and asthma).) 1 each 3  . ibuprofen (ADVIL) 600 MG tablet Take 1 tablet (600 mg total) by mouth every 8 (eight) hours as needed. (Patient taking differently: Take 600 mg by mouth every 8 (eight) hours as needed for mild pain.) 30 tablet 0  . levocetirizine (XYZAL) 5 MG tablet Take 5 mg by mouth every evening.    Marland Kitchen levothyroxine (SYNTHROID) 100 MCG tablet TAKE 1 TABLET EVERY OTHER DAY ALTERNATING WITH 88 MCG TABLET (Patient taking differently: Take 100 mcg by mouth See admin instructions. TAKE 100 MCG TABLET EVERY OTHER DAY ALTERNATING WITH 88 MCG TABLET) 45 tablet 1  . levothyroxine (SYNTHROID) 88 MCG tablet * (Patient taking differently: Take 88 mcg by mouth See admin instructions. TAKE 88 MCG TABLET EVERY OTHER DAY ALTERNATING WITH 100 MCG TABLET) 45 tablet 1  . losartan (COZAAR) 25 MG tablet Take 1 tablet (25 mg total) by mouth daily. 90 tablet 0  . metroNIDAZOLE (FLAGYL) 500 MG tablet Take 1 tablet (500 mg total) by mouth 3 (three) times daily. 30 tablet 0  . montelukast (SINGULAIR) 10 MG tablet * (Patient taking differently: Take 10 mg by mouth at bedtime.) 90 tablet 1  . Multiple Vitamin (MULTIVITAMIN WITH MINERALS) TABS tablet Take 1 tablet by mouth daily.    Marland Kitchen omega-3 acid ethyl esters (LOVAZA) 1 g capsule TAKE 2 CAPSULES (2 G TOTAL) BY MOUTH 2 (TWO) TIMES DAILY. 360 capsule 0  . traMADol (ULTRAM) 50 MG tablet Take 1 tablet (50 mg total) by mouth every 6 (six) hours as needed (mild pain). 10 tablet  0  . venlafaxine XR (EFFEXOR-XR) 75 MG 24 hr capsule TAKE 1 CAPSULE BY MOUTH EVERY DAY AT BEDTIME (Patient taking differently: Take 75 mg by mouth daily.) 90 capsule 4  . Vitamin D, Ergocalciferol, (DRISDOL) 1.25 MG (50000 UT) CAPS capsule TAKE 1 CAPSULE BY  MOUTH ONCE WEEKLY (Patient taking differently: Take 50,000 Units by mouth every Friday.) 12 capsule 1   No facility-administered medications prior to visit.    Review of Systems  Constitutional: Negative for chills, fever, malaise/fatigue and weight loss.  HENT: Negative for congestion, sinus pain and sore throat.   Eyes: Negative.   Respiratory: Negative for cough, hemoptysis, sputum production, shortness of breath and wheezing.   Cardiovascular: Negative for chest pain, palpitations, orthopnea, claudication and leg swelling.  Gastrointestinal: Negative for abdominal pain, heartburn, nausea and vomiting.  Genitourinary: Negative.   Musculoskeletal: Negative for joint pain and myalgias.  Skin: Negative for rash.  Neurological: Negative for weakness.  Endo/Heme/Allergies: Negative.   Psychiatric/Behavioral: Negative.    Objective:   Vitals:   10/22/20 1042  BP: 100/60  Pulse: 100  Temp: (!) 97.5 F (36.4 C)  TempSrc: Temporal  SpO2: 93%  Weight: 130 lb 3.2 oz (59.1 kg)  Height: 5\' 7"  (1.702 m)   Physical Exam Constitutional:      General: She is not in acute distress.    Appearance: She is not ill-appearing.  HENT:     Head: Normocephalic and atraumatic.  Eyes:     General: No scleral icterus.    Conjunctiva/sclera: Conjunctivae normal.     Pupils: Pupils are equal, round, and reactive to light.  Cardiovascular:     Rate and Rhythm: Normal rate and regular rhythm.     Pulses: Normal pulses.     Heart sounds: Normal heart sounds. No murmur heard.   Pulmonary:     Effort: Pulmonary effort is normal.     Breath sounds: Normal breath sounds. No wheezing, rhonchi or rales.  Abdominal:     General: Bowel sounds are  normal.     Palpations: Abdomen is soft.  Musculoskeletal:     Right lower leg: No edema.     Left lower leg: No edema.  Lymphadenopathy:     Cervical: No cervical adenopathy.  Skin:    General: Skin is warm and dry.  Neurological:     General: No focal deficit present.     Mental Status: She is alert.  Psychiatric:        Mood and Affect: Mood normal.        Behavior: Behavior normal.        Thought Content: Thought content normal.        Judgment: Judgment normal.    CBC    Component Value Date/Time   WBC 10.0 10/02/2020 0610   RBC 4.09 10/02/2020 0610   HGB 10.1 (L) 10/02/2020 0610   HGB 13.7 02/06/2019 0908   HGB 14.6 05/15/2014 0908   HCT 33.2 (L) 10/02/2020 0610   HCT 41.3 02/06/2019 0908   HCT 44.9 05/15/2014 0908   PLT 413 (H) 10/02/2020 0610   PLT 322 02/06/2019 0908   MCV 81.2 10/02/2020 0610   MCV 91 02/06/2019 0908   MCV 95.6 05/15/2014 0908   MCH 24.7 (L) 10/02/2020 0610   MCHC 30.4 10/02/2020 0610   RDW 16.6 (H) 10/02/2020 0610   RDW 13.1 02/06/2019 0908   RDW 12.7 05/15/2014 0908   LYMPHSABS 1.3 02/06/2019 0908   LYMPHSABS 1.5 05/15/2014 0908   MONOABS 0.5 05/15/2014 0908   EOSABS 0.2 02/06/2019 0908   EOSABS 0.1 05/27/2010 1030   BASOSABS 0.0 02/06/2019 0908   BASOSABS 0.0 05/15/2014 0908   BMP Latest Ref Rng & Units 10/02/2020 10/01/2020 12/19/2019  Glucose 70 - 99 mg/dL 94 120(H) 87  BUN 8 - 23 mg/dL 9 11 16   Creatinine 0.44 - 1.00 mg/dL 0.77 0.76 0.80  BUN/Creat Ratio 12 - 28 - - 20  Sodium 135 - 145 mmol/L 136 136 138  Potassium 3.5 - 5.1 mmol/L 3.5 4.1 4.3  Chloride 98 - 111 mmol/L 101 104 102  CO2 22 - 32 mmol/L 23 21(L) 25  Calcium 8.9 - 10.3 mg/dL 8.4(L) 8.7(L) 9.1   Chest imaging: CXR 05/07/20 Lungs are well expanded, symmetric, and clear. No pneumothorax or pleural effusion. Cardiac size within normal limits. Pulmonary vascularity is normal. Osseous structures are age-appropriate. No acute bone abnormality.  PFT: No flowsheet  data found.  Spirometry 2016 was within normal limits. No obstruction noted.  Assessment & Plan:   Cough variant asthma  Discussion: Layaan Mott is a 75 year old woman, former smoker with history of GERD and cough variant asthma who returns to pulmonary clinic for follow up of cough variant asthma.   She has been doing well since last visit. She is to continue as needed albuterol for her cough symptoms and if she is using albuterol greater than 3-4 times per day then she is to start using her Fluticasone inhaler. She does report throat and vocal cord irritation with the steroid inhaler. I instructed her to continue as is for now and if her symptoms worsen where she needs a maintenance inahler on a regular basis then we will consider LAMA therapy as this may be more tolerable for her throat/vocal cords.   Follow up in 6 months.  Lindsey Jackson, MD [facility] Office: [phone]   See Amion for Pager Details      Current Outpatient Medications:  .  albuterol (PROAIR HFA) 108 (90 Base) MCG/ACT inhaler, Inhale 1-2 puffs into the lungs every 6 (six) hours as needed for wheezing or shortness of breath., Disp: 8 g, Rfl: 5 .  atorvastatin (LIPITOR) 10 MG tablet, Take 1 tablet (10 mg total) by mouth at bedtime., Disp: 90 tablet, Rfl: 0 .  calcium carbonate (OS-CAL - DOSED IN MG OF ELEMENTAL CALCIUM) 1250 (500 Ca) MG tablet, Take 1 tablet by mouth daily., Disp: , Rfl:  .  ciprofloxacin (CIPRO) 500 MG tablet, Take 1 tablet (500 mg total) by mouth 2 (two) times daily., Disp: 20 tablet, Rfl: 0 .  clobetasol (TEMOVATE) 0.05 % external solution, Apply 1 application topically daily as needed (Scalp irritation)., Disp: , Rfl:  .  esomeprazole (NEXIUM) 20 MG capsule, Take 20 mg by mouth daily at 12 noon., Disp: , Rfl:  .  ezetimibe (ZETIA) 10 MG tablet, Take 1 tablet (10 mg total) by mouth daily., Disp: 90 tablet, Rfl: 3 .  famotidine (PEPCID) 20 MG tablet, Take 1 tablet  (20 mg total) by mouth 2 (two) times daily as needed for heartburn or indigestion., Disp: 60 tablet, Rfl: 1 .  fluticasone (FLONASE) 50 MCG/ACT nasal spray, PLACE 1 SPRAY INTO BOTH NOSTRILS DAILY., Disp: 48 mL, Rfl: 1 .  fluticasone (FLOVENT HFA) 44 MCG/ACT inhaler, Inhale 2 puffs into the lungs every 12 (twelve) hours. (Patient taking differently: Inhale 2 puffs into the lungs 2 (two) times daily as needed (cough and asthma).), Disp: 1 each, Rfl: 3 .  ibuprofen (ADVIL) 600 MG tablet, Take 1 tablet (600 mg total) by mouth every 8 (eight) hours as needed. (Patient taking differently: Take 600 mg by mouth every 8 (eight) hours as needed for mild pain.), Disp: 30 tablet, Rfl: 0 .  levocetirizine (XYZAL) 5  MG tablet, Take 5 mg by mouth every evening., Disp: , Rfl:  .  levothyroxine (SYNTHROID) 100 MCG tablet, TAKE 1 TABLET EVERY OTHER DAY ALTERNATING WITH 88 MCG TABLET (Patient taking differently: Take 100 mcg by mouth See admin instructions. TAKE 100 MCG TABLET EVERY OTHER DAY ALTERNATING WITH 88 MCG TABLET), Disp: 45 tablet, Rfl: 1 .  levothyroxine (SYNTHROID) 88 MCG tablet, * (Patient taking differently: Take 88 mcg by mouth See admin instructions. TAKE 88 MCG TABLET EVERY OTHER DAY ALTERNATING WITH 100 MCG TABLET), Disp: 45 tablet, Rfl: 1 .  losartan (COZAAR) 25 MG tablet, Take 1 tablet (25 mg total) by mouth daily., Disp: 90 tablet, Rfl: 0 .  metroNIDAZOLE (FLAGYL) 500 MG tablet, Take 1 tablet (500 mg total) by mouth 3 (three) times daily., Disp: 30 tablet, Rfl: 0 .  montelukast (SINGULAIR) 10 MG tablet, * (Patient taking differently: Take 10 mg by mouth at bedtime.), Disp: 90 tablet, Rfl: 1 .  Multiple Vitamin (MULTIVITAMIN WITH MINERALS) TABS tablet, Take 1 tablet by mouth daily., Disp: , Rfl:  .  omega-3 acid ethyl esters (LOVAZA) 1 g capsule, TAKE 2 CAPSULES (2 G TOTAL) BY MOUTH 2 (TWO) TIMES DAILY., Disp: 360 capsule, Rfl: 0 .  traMADol (ULTRAM) 50 MG tablet, Take 1 tablet (50 mg total) by mouth  every 6 (six) hours as needed (mild pain)., Disp: 10 tablet, Rfl: 0 .  venlafaxine XR (EFFEXOR-XR) 75 MG 24 hr capsule, TAKE 1 CAPSULE BY MOUTH EVERY DAY AT BEDTIME (Patient taking differently: Take 75 mg by mouth daily.), Disp: 90 capsule, Rfl: 4 .  Vitamin D, Ergocalciferol, (DRISDOL) 1.25 MG (50000 UT) CAPS capsule, TAKE 1 CAPSULE BY MOUTH ONCE WEEKLY (Patient taking differently: Take 50,000 Units by mouth every Friday.), Disp: 12 capsule, Rfl: 1

## 2020-10-22 NOTE — Patient Instructions (Signed)
Continue Fluticasone 64mcg 1-2 puffs twice daily as needed  Continue albuterol use 1-2 puffs as needed every 4-6 hours for cough  If you need to use albuterol more than 3-4 times per day on a regular basis give our office a call and we can trial you on Spiriva to avoid the throat/vocal cord irritation of fluticasone inhaler

## 2020-10-22 NOTE — Telephone Encounter (Signed)
Patient called and would like to know if she can get something this weekend has not heard anything as of yet on CT results. She said she would like to build up her strength.

## 2020-10-25 NOTE — Telephone Encounter (Signed)
Pt is requesting a call back from a nurse to discuss the CT scan results.

## 2020-10-25 NOTE — Telephone Encounter (Signed)
Lindsey Price looks like you were waiting on the CT scan results.  The pt also wants to know if she should remain on clear liquids.

## 2020-10-27 ENCOUNTER — Encounter: Payer: Self-pay | Admitting: Nurse Practitioner

## 2020-10-27 ENCOUNTER — Encounter: Payer: Self-pay | Admitting: Gastroenterology

## 2020-10-27 ENCOUNTER — Ambulatory Visit: Payer: PPO | Admitting: Nurse Practitioner

## 2020-10-27 VITALS — BP 90/60 | HR 92 | Ht 66.0 in | Wt 126.5 lb

## 2020-10-27 DIAGNOSIS — K6389 Other specified diseases of intestine: Secondary | ICD-10-CM | POA: Diagnosis not present

## 2020-10-27 DIAGNOSIS — R933 Abnormal findings on diagnostic imaging of other parts of digestive tract: Secondary | ICD-10-CM

## 2020-10-27 MED ORDER — PLENVU 140 G PO SOLR: 1.0000 | ORAL | 0 refills | Status: DC

## 2020-10-27 NOTE — Patient Instructions (Addendum)
If you are age 77 or older, your body mass index should be between 23-30. Your Body mass index is 20.42 kg/m. If this is out of the aforementioned range listed, please consider follow up with your Primary Care Provider.  If you are age 38 or younger, your body mass index should be between 19-25. Your Body mass index is 20.42 kg/m. If this is out of the aformentioned range listed, please consider follow up with your Primary Care Provider.   You have been scheduled for a colonoscopy. Please follow written instructions given to you at your visit today.  Please pick up your prep supplies at the pharmacy within the next 1-3 days. If you use inhalers (even only as needed), please bring them with you on the day of your procedure.  Follow up pending the results of your Colonoscopy, or as needed.   Thank you for entrusting me with your care and choosing [facility].  Tye Savoy, NP-C

## 2020-10-27 NOTE — Telephone Encounter (Signed)
Called patient and discussed the plan.

## 2020-10-27 NOTE — Progress Notes (Signed)
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 There is question of fistulization between small bowel loops in the area and proximal colon. No further abdominal pain nor diarrhea after course of antibiotics. Unremarkable right colon / cecum on polyp surveillance colonoscopy one year ago.  --Patient will be scheduled for a colonoscopy for further evaluation of CT scan findings. The risks and benefits of colonoscopy with possible polypectomy / biopsies were discussed and the patient agrees to proceed. She is scheduled for 11/04/20 at 11 am.   # Right breast cancer, high grade DCIS. Scheduled for lumpectomy on 11/05/20.   # Normocytic anemia. Hgb 10.1 in hospital on 10/02/20, down from baseline of 13.5. Decline may be dilutional related to IVF in hospital. However, she is borderline microcytic.  --Further evaluation at time of colonoscopy.   HISTORY OF PRESENT ILLNESS     Primary Gastroenterologist : Lindsey Bowie, MD  Chief Complaint : hospital follow up   Lindsey Price is a 75 y.o. female with PMH / Delta significant for colon polyps, breast cancer, HTN, hyperlipidemia, asthma, hypothyroidism, hysterectomy, recently diagnosed breast cancer ( high grade DCIS).    Lindsey Price was hospitalized by general surgery the end of January with lower abdominal pain and diarrhea. CT scan showed severe enteritis involving distal jejunum and ileum, thick walled cyst in RLQ with ? Fistula between small bowel and proximal colon. Symptoms improved with antibiotics.  We saw her in consultation and recommended antibiotics at discharge and two week follow up CT scan.   Of note patient had a polyp surveillance colonoscopy February 2021. The exam was complete with an excellent prep. Two polyps > 10 cm were removed but cecum appeared normal except for a tiny polyp which was removed. Patient was discharged home on soft  diet with plans for repeat CT scan in a couple of weeks.  CT scan completed 10/21/20 shows distended small bowel in pelvis, inflammatory changes in RLQ. Appendix not seen. In region of cecum there is again seen a dilated cystic thick walled structure, 3 separate ostia to small bowel loops. The posterior wall of the inflammatory structure is inseparable form psoas musculature, the mid segment of right ureter and right adnexa / ovary.  There is at least a partial SBO, and a suspicious 6 mm nodule in RLL.    Interval History:  Patient has lost 27 pounds since recent hospitalization. She has been existing on a very limited diet with yogurt, Cheerios, ice cream, Jell-O's, protein shakes.  She has no abdominal pain.  Bowel movements are normal.  No fevers. She had a follow-up with Lindsey Price at [place] who told her she may require surgery and that it would need to be done at [facility] or [place].    Data Reviewed: 10/01/20 CT scan w/ contrast IMPRESSION: 1. Irregular collection without discernible bowel wall in the area of the cecum in the RIGHT lower quadrant with complex fistulous network, associated with signs of severe enteritis and obstruction. 2. Findings could be related to severe sequela of inflammatory bowel disease based on appearance. Bowel compromise leading to severe enteritis is another differential consideration along with neoplasm. 3. Ascites and interloop fluid as described in the setting of marked enteritis. 4. Masslike area/collection contiguous with the RIGHT adnexa. 5. 7 mm RIGHT lower lobe pulmonary nodule without comparison is. Short interval follow-up is suggested in  this patient to Fleischner society guidelines do not apply. Follow-up within 3 months or dedicated evaluation of the chest if neoplasm is discovered on further assessment. 6. Suggested hepatic steatosis without focal, suspicious hepatic lesion. 7. Aortic atherosclerosis  10/21/20 CT scan  abd/ pelvis with  contrast IMPRESSION: Acute colitis/enteritis of the cecum/ascending colon and the associated terminal loops of ileum. As was recently described on CT imaging, the anatomy of distal ileum, ileocecal valve, cecum is atypical and correlation with patient's prior surgical record would be useful. The acute inflammatory changes contribute to at least a partial small bowel obstruction.  Primary concern of this inflammatory cystic mass/bowel of the right lower quadrant is malignancy given that the posterior margin is inseparable from the psoas muscle, adnexa, and the right ureter. Correlation with CEA may be useful. Inflammatory/infectious changes are also a consideration.  6 mm nodule of the right lower lobe, suspicious, and could potentially represent metastasis. At least a non-contrast chest CT at 6-12 months is recommended. If the nodule is stable at time of repeat CT, then future CT at 18-24 months (from today's scan) is considered optional for low-risk patients, but is recommended for high-risk patients. This recommendation follows the consensus statement: Guidelines for Management of Incidental Pulmonary Nodules Detected on CT Images: From the Fleischner Society 2017; Radiology 2017; 284:228-243.  Previous Endoscopic Evaluations / Pertinent Studies:  Feb 2021 polyp surveillance colonoscoy - One 2 mm polyp in the cecum, removed with a cold biopsy forceps. Resected and retrieved. - Two 10 to 12 mm polyps in the ascending colon, removed with a cold snare. Resected and retrieved. - Non-bleeding internal hemorrhoids  Surgical [P], ascending and cecum, polyp (3) - TUBULAR ADENOMA (X2 FRAGMENTS). - SESSILE SERRATED POLYP WITHOUT DYSPLASIA (X2 FRAGMENTS). - NO HIGH GRADE DYSPLASIA OR MALIGNAN    Past Medical History:  Diagnosis Date  . Allergy   . Arthralgia 09/11/2011  . Asthma   . Breast cancer ([place]) 2011   RIGHT BREAST CA  . DCIS (ductal carcinoma in situ) of breast 09/11/2011   . Hyperlipidemia   . Hypertension   . Insomnia   . Thyroid disease    hypothyroidism    Current Medications, Allergies, Past Surgical History, Family History and Social History were reviewed in [employer] record.   Current Outpatient Medications  Medication Sig Dispense Refill  . albuterol (PROAIR HFA) 108 (90 Base) MCG/ACT inhaler Inhale 1-2 puffs into the lungs every 6 (six) hours as needed for wheezing or shortness of breath. 8 g 5  . atorvastatin (LIPITOR) 10 MG tablet Take 1 tablet (10 mg total) by mouth at bedtime. 90 tablet 0  . calcium carbonate (OS-CAL - DOSED IN MG OF ELEMENTAL CALCIUM) 1250 (500 Ca) MG tablet Take 1 tablet by mouth daily.    . clobetasol (TEMOVATE) 0.05 % external solution Apply 1 application topically daily as needed (Scalp irritation).    Marland Kitchen esomeprazole (NEXIUM) 20 MG capsule Take 20 mg by mouth daily at 12 noon.    . ezetimibe (ZETIA) 10 MG tablet Take 1 tablet (10 mg total) by mouth daily. 90 tablet 3  . fluticasone (FLONASE) 50 MCG/ACT nasal spray PLACE 1 SPRAY INTO BOTH NOSTRILS DAILY. 48 mL 1  . fluticasone (FLOVENT HFA) 44 MCG/ACT inhaler Inhale 2 puffs into the lungs every 12 (twelve) hours. (Patient taking differently: Inhale 2 puffs into the lungs 2 (two) times daily as needed (cough and asthma).) 1 each 3  . ibuprofen (ADVIL) 600 MG tablet Take  1 tablet (600 mg total) by mouth every 8 (eight) hours as needed. (Patient taking differently: Take 600 mg by mouth every 8 (eight) hours as needed for mild pain.) 30 tablet 0  . levocetirizine (XYZAL) 5 MG tablet Take 5 mg by mouth every evening.    Marland Kitchen levothyroxine (SYNTHROID) 100 MCG tablet TAKE 1 TABLET EVERY OTHER DAY ALTERNATING WITH 88 MCG TABLET (Patient taking differently: Take 100 mcg by mouth See admin instructions. TAKE 100 MCG TABLET EVERY OTHER DAY ALTERNATING WITH 88 MCG TABLET) 45 tablet 1  . levothyroxine (SYNTHROID) 88 MCG tablet * (Patient taking differently: Take 88 mcg  by mouth See admin instructions. TAKE 88 MCG TABLET EVERY OTHER DAY ALTERNATING WITH 100 MCG TABLET) 45 tablet 1  . losartan (COZAAR) 25 MG tablet Take 1 tablet (25 mg total) by mouth daily. 90 tablet 0  . metroNIDAZOLE (FLAGYL) 500 MG tablet Take 1 tablet (500 mg total) by mouth 3 (three) times daily. 30 tablet 0  . montelukast (SINGULAIR) 10 MG tablet * (Patient taking differently: Take 10 mg by mouth at bedtime.) 90 tablet 1  . Multiple Vitamin (MULTIVITAMIN WITH MINERALS) TABS tablet Take 1 tablet by mouth daily.    Marland Kitchen omega-3 acid ethyl esters (LOVAZA) 1 g capsule TAKE 2 CAPSULES (2 G TOTAL) BY MOUTH 2 (TWO) TIMES DAILY. 360 capsule 0  . traMADol (ULTRAM) 50 MG tablet Take 1 tablet (50 mg total) by mouth every 6 (six) hours as needed (mild pain). 10 tablet 0  . venlafaxine XR (EFFEXOR-XR) 75 MG 24 hr capsule TAKE 1 CAPSULE BY MOUTH EVERY DAY AT BEDTIME (Patient taking differently: Take 75 mg by mouth daily.) 90 capsule 4  . Vitamin D, Ergocalciferol, (DRISDOL) 1.25 MG (50000 UT) CAPS capsule TAKE 1 CAPSULE BY MOUTH ONCE WEEKLY (Patient taking differently: Take 50,000 Units by mouth every Friday.) 12 capsule 1   No current facility-administered medications for this visit.    Review of Systems: No chest pain. No shortness of breath. No urinary complaints.   PHYSICAL EXAM :    Wt Readings from Last 3 Encounters:  10/22/20 130 lb 3.2 oz (59.1 kg)  10/15/20 134 lb 1.6 oz (60.8 kg)  10/12/20 140 lb (63.5 kg)    BP 90/60 (BP Location: Left Arm, Patient Position: Sitting, Cuff Size: Normal)   Pulse 92   Ht 5\' 6"  (1.676 m) Comment: height measured without shoes  Wt 126 lb 8 oz (57.4 kg)   BMI 20.42 kg/m  Constitutional:  Pleasant female in no acute distress. Psychiatric: Normal mood and affect. Behavior is normal. EENT: Pupils normal.  Conjunctivae are normal. No scleral icterus. Neck supple.  Cardiovascular: Normal rate, regular rhythm. No edema Pulmonary/chest: Effort normal and  breath sounds normal. No wheezing, rales or rhonchi. Abdominal: Soft, mild RLQ fullness and mild tenderness. Abdomen nondistended, nontender. Bowel sounds active throughout.  Neurological: Alert and oriented to person place and time. Skin: Skin is warm and dry. No rashes noted.  Tye Savoy, NP  10/27/2020, 2:24 PM

## 2020-10-28 ENCOUNTER — Other Ambulatory Visit: Payer: Self-pay | Admitting: Nurse Practitioner

## 2020-10-28 ENCOUNTER — Other Ambulatory Visit: Payer: Self-pay | Admitting: Gastroenterology

## 2020-10-29 ENCOUNTER — Encounter: Payer: Self-pay | Admitting: *Deleted

## 2020-11-01 ENCOUNTER — Other Ambulatory Visit (HOSPITAL_COMMUNITY): Payer: PPO

## 2020-11-01 NOTE — Progress Notes (Signed)
Reviewed and agree with documentation and assessment and plan. K. Veena Lamorris Knoblock , MD   

## 2020-11-02 ENCOUNTER — Other Ambulatory Visit: Payer: Self-pay

## 2020-11-02 ENCOUNTER — Other Ambulatory Visit (HOSPITAL_COMMUNITY): Payer: PPO

## 2020-11-02 ENCOUNTER — Encounter (HOSPITAL_BASED_OUTPATIENT_CLINIC_OR_DEPARTMENT_OTHER): Payer: Self-pay | Admitting: General Surgery

## 2020-11-02 ENCOUNTER — Other Ambulatory Visit (HOSPITAL_COMMUNITY)
Admission: RE | Admit: 2020-11-02 | Discharge: 2020-11-02 | Disposition: A | Discharge status: Home / Self Care | Payer: PPO | Source: Ambulatory Visit | Attending: General Surgery | Admitting: General Surgery

## 2020-11-02 DIAGNOSIS — Z01812 Encounter for preprocedural laboratory examination: Secondary | ICD-10-CM | POA: Diagnosis not present

## 2020-11-02 DIAGNOSIS — Z20822 Contact with and (suspected) exposure to covid-19: Secondary | ICD-10-CM | POA: Insufficient documentation

## 2020-11-02 LAB — SARS CORONAVIRUS 2 (TAT 6-24 HRS): SARS Coronavirus 2: NEGATIVE

## 2020-11-03 ENCOUNTER — Ambulatory Visit
Admission: RE | Admit: 2020-11-03 | Discharge: 2020-11-03 | Disposition: A | Discharge status: Home / Self Care | Payer: PPO | Source: Ambulatory Visit | Attending: General Surgery | Admitting: General Surgery

## 2020-11-03 DIAGNOSIS — C50912 Malignant neoplasm of unspecified site of left female breast: Secondary | ICD-10-CM | POA: Diagnosis not present

## 2020-11-03 DIAGNOSIS — C50412 Malignant neoplasm of upper-outer quadrant of left female breast: Secondary | ICD-10-CM

## 2020-11-03 NOTE — Progress Notes (Signed)
      Enhanced Recovery after Surgery for Orthopedics Enhanced Recovery after Surgery is a protocol used to improve the stress on your body and your recovery after surgery.  Patient Instructions  . The night before surgery:  o No food after midnight. ONLY clear liquids after midnight  . The day of surgery (if you do NOT have diabetes):  o Drink ONE (1) Pre-Surgery Clear Ensure as directed.   o This drink was given to you during your hospital  pre-op appointment visit. o The pre-op nurse will instruct you on the time to drink the  Pre-Surgery Ensure depending on your surgery time. o Finish the drink at the designated time by the pre-op nurse.  o Nothing else to drink after completing the  Pre-Surgery Clear Ensure.  . The day of surgery (if you have diabetes): o Drink ONE (1) Gatorade 2 (G2) as directed. o This drink was given to you during your hospital  pre-op appointment visit.  o The pre-op nurse will instruct you on the time to drink the   Gatorade 2 (G2) depending on your surgery time. o Color of the Gatorade may vary. Red is not allowed. o Nothing else to drink after completing the  Gatorade 2 (G2).         If you have questions, please contact your surgeon's office. Surgical soap given to patients with instructions. Patient verbalizes understanding.

## 2020-11-04 ENCOUNTER — Encounter: Payer: Self-pay | Admitting: Gastroenterology

## 2020-11-04 ENCOUNTER — Ambulatory Visit (AMBULATORY_SURGERY_CENTER): Payer: PPO | Admitting: Gastroenterology

## 2020-11-04 ENCOUNTER — Other Ambulatory Visit: Payer: Self-pay

## 2020-11-04 VITALS — BP 127/52 | HR 77 | Temp 97.8°F | Resp 21 | Ht 66.0 in | Wt 126.0 lb

## 2020-11-04 DIAGNOSIS — C8389 Other non-follicular lymphoma, extranodal and solid organ sites: Secondary | ICD-10-CM | POA: Diagnosis not present

## 2020-11-04 DIAGNOSIS — C8510 Unspecified B-cell lymphoma, unspecified site: Secondary | ICD-10-CM | POA: Diagnosis not present

## 2020-11-04 DIAGNOSIS — K648 Other hemorrhoids: Secondary | ICD-10-CM | POA: Diagnosis not present

## 2020-11-04 DIAGNOSIS — J45909 Unspecified asthma, uncomplicated: Secondary | ICD-10-CM | POA: Diagnosis not present

## 2020-11-04 DIAGNOSIS — R933 Abnormal findings on diagnostic imaging of other parts of digestive tract: Secondary | ICD-10-CM | POA: Diagnosis not present

## 2020-11-04 DIAGNOSIS — I10 Essential (primary) hypertension: Secondary | ICD-10-CM | POA: Diagnosis not present

## 2020-11-04 DIAGNOSIS — K219 Gastro-esophageal reflux disease without esophagitis: Secondary | ICD-10-CM | POA: Diagnosis not present

## 2020-11-04 DIAGNOSIS — K6389 Other specified diseases of intestine: Secondary | ICD-10-CM

## 2020-11-04 MED ORDER — SODIUM CHLORIDE 0.9 % IV SOLN
500.0000 mL | Freq: Once | INTRAVENOUS | Status: DC
Start: 2020-11-04 — End: 2020-11-04

## 2020-11-04 NOTE — Progress Notes (Signed)
Report to PACU, RN, vss, BBS= Clear.  

## 2020-11-04 NOTE — Patient Instructions (Signed)
YOU HAD AN ENDOSCOPIC PROCEDURE TODAY AT THE [facility]:   Refer to the procedure report that was given to you for any specific questions about what was found during the examination.  If the procedure report does not answer your questions, please call your gastroenterologist to clarify.  If you requested that your care partner not be given the details of your procedure findings, then the procedure report has been included in a sealed envelope for you to review at your convenience later.  YOU SHOULD EXPECT: Some feelings of bloating in the abdomen. Passage of more gas than usual.  Walking can help get rid of the air that was put into your GI tract during the procedure and reduce the bloating. If you had a lower endoscopy (such as a colonoscopy or flexible sigmoidoscopy) you may notice spotting of blood in your stool or on the toilet paper. If you underwent a bowel prep for your procedure, you may not have a normal bowel movement for a few days.  Please Note:  You might notice some irritation and congestion in your nose or some drainage.  This is from the oxygen used during your procedure.  There is no need for concern and it should clear up in a day or so.  SYMPTOMS TO REPORT IMMEDIATELY:   Following lower endoscopy (colonoscopy or flexible sigmoidoscopy):  Excessive amounts of blood in the stool  Significant tenderness or worsening of abdominal pains  Swelling of the abdomen that is new, acute  Fever of 100F or higher  For urgent or emergent issues, a gastroenterologist can be reached at any hour by calling [phone]. Do not use MyChart messaging for urgent concerns.    DIET:  We do recommend a small meal at first, but then you may proceed to your regular diet.  Drink plenty of fluids but you should avoid alcoholic beverages for 24 hours.  ACTIVITY:  You should plan to take it easy for the rest of today and you should NOT DRIVE or use heavy machinery until tomorrow (because  of the sedation medicines used during the test).    FOLLOW UP: Our staff will call the number listed on your records 48-72 hours following your procedure to check on you and address any questions or concerns that you may have regarding the information given to you following your procedure. If we do not reach you, we will leave a message.  We will attempt to reach you two times.  During this call, we will ask if you have developed any symptoms of COVID 19. If you develop any symptoms (ie: fever, flu-like symptoms, shortness of breath, cough etc.) before then, please call [phone].  If you test positive for Covid 19 in the 2 weeks post procedure, please call and report this information to us.    If any biopsies were taken you will be contacted by phone or by letter within the next 1-3 weeks.  Please call us at [phone] if you have not heard about the biopsies in 3 weeks.    SIGNATURES/CONFIDENTIALITY: You and/or your care partner have signed paperwork which will be entered into your electronic medical record.  These signatures attest to the fact that that the information above on your After Visit Summary has been reviewed and is understood.  Full responsibility of the confidentiality of this discharge information lies with you and/or your care-partner. 

## 2020-11-04 NOTE — Op Note (Signed)
[facility] Patient Name: Rhett Najera Procedure Date: 11/04/2020 11:07 AM MRN: [id number] Endoscopist: Mauri Pole , MD Age: 75 Referring MD:  Date of Birth: May 13, 1946 Gender: Female Account #: [id number] Procedure:                Colonoscopy Indications:              Abdominal pain in the right lower quadrant,                            Abnormal CT of the GI tract Medicines:                Monitored Anesthesia Care Procedure:                Pre-Anesthesia Assessment:                           - Prior to the procedure, a History and Physical                            was performed, and patient medications and                            allergies were reviewed. The patient's tolerance of                            previous anesthesia was also reviewed. The risks                            and benefits of the procedure and the sedation                            options and risks were discussed with the patient.                            All questions were answered, and informed consent                            was obtained. Prior Anticoagulants: The patient has                            taken no previous anticoagulant or antiplatelet                            agents. ASA Grade Assessment: III - A patient with                            severe systemic disease. After reviewing the risks                            and benefits, the patient was deemed in                            satisfactory condition to undergo the procedure.  After obtaining informed consent, the colonoscope                            was passed under direct vision. Throughout the                            procedure, the patient's blood pressure, pulse, and                            oxygen saturations were monitored continuously. The                            Olympus PCF-H190DL [id number]) Colonoscope was                            introduced through the anus and  advanced to the the                            cecum, identified by appendiceal orifice and                            ileocecal valve. The colonoscopy was performed                            without difficulty. The patient tolerated the                            procedure well. The quality of the bowel                            preparation was good. The rectum was photographed. Scope In: 11:17:01 AM Scope Out: 11:47:08 AM Scope Withdrawal Time: 0 hours 17 minutes 36 seconds  Total Procedure Duration: 0 hours 30 minutes 7 seconds  Findings:                 The perianal and digital rectal examinations were                            normal.                           An infiltrative and ulcerated partially obstructing                            large mass was found at 85 cm proximal to the anus.                            The mass was circumferential. Scope could not be                            extended beyond that lesion. Unable to identify                            landmarks. Biopsies were taken with a cold forceps  for histology. Distal fold areas X2 was tattooed                            with an injection of 4 mL of Spot (carbon black).                           Non-bleeding internal hemorrhoids were found during                            retroflexion. The hemorrhoids were small. Complications:            No immediate complications. Estimated Blood Loss:     Estimated blood loss was minimal. Impression:               - Rule out malignancy, partially obstructing tumor                            at 85 cm proximal to the anus. Biopsied. Tattooed.                           - Non-bleeding internal hemorrhoids. Recommendation:           - Patient has a contact number available for                            emergencies. The signs and symptoms of potential                            delayed complications were discussed with the                             patient. Return to normal activities tomorrow.                            Written discharge instructions were provided to the                            patient.                           - Clear liquid diet.                           - Continue present medications.                           - Await pathology results.                           - Repeat colonoscopy date to be determined after                            pending pathology results are reviewed for                            surveillance based on pathology results. Mauri Pole, MD 11/04/2020  11:54:44 AM This report has been signed electronically.

## 2020-11-04 NOTE — Progress Notes (Signed)
Called to room to assist during endoscopic procedure.  Patient ID and intended procedure confirmed with present staff. Received instructions for my participation in the procedure from the performing physician.   Per Dr. Silverio Decamp send path [place].

## 2020-11-05 ENCOUNTER — Ambulatory Visit (HOSPITAL_COMMUNITY)
Admission: RE | Admit: 2020-11-05 | Discharge: 2020-11-05 | Disposition: A | Discharge status: Home / Self Care | Payer: PPO | Source: Ambulatory Visit | Attending: General Surgery | Admitting: General Surgery

## 2020-11-05 ENCOUNTER — Ambulatory Visit (HOSPITAL_BASED_OUTPATIENT_CLINIC_OR_DEPARTMENT_OTHER)
Admission: RE | Admit: 2020-11-05 | Discharge: 2020-11-05 | Disposition: A | Discharge status: Home / Self Care | Payer: PPO | Attending: General Surgery | Admitting: General Surgery

## 2020-11-05 ENCOUNTER — Encounter (HOSPITAL_BASED_OUTPATIENT_CLINIC_OR_DEPARTMENT_OTHER): Payer: Self-pay | Admitting: General Surgery

## 2020-11-05 ENCOUNTER — Encounter (HOSPITAL_BASED_OUTPATIENT_CLINIC_OR_DEPARTMENT_OTHER)
Admission: RE | Disposition: A | Discharge status: Home / Self Care | Payer: Self-pay | Source: Home / Self Care | Attending: General Surgery

## 2020-11-05 ENCOUNTER — Other Ambulatory Visit: Payer: Self-pay

## 2020-11-05 ENCOUNTER — Ambulatory Visit (HOSPITAL_BASED_OUTPATIENT_CLINIC_OR_DEPARTMENT_OTHER): Payer: PPO | Admitting: Anesthesiology

## 2020-11-05 ENCOUNTER — Ambulatory Visit
Admission: RE | Admit: 2020-11-05 | Discharge: 2020-11-05 | Disposition: A | Discharge status: Home / Self Care | Payer: PPO | Source: Ambulatory Visit | Attending: General Surgery | Admitting: General Surgery

## 2020-11-05 DIAGNOSIS — C50912 Malignant neoplasm of unspecified site of left female breast: Secondary | ICD-10-CM | POA: Diagnosis not present

## 2020-11-05 DIAGNOSIS — D630 Anemia in neoplastic disease: Secondary | ICD-10-CM | POA: Diagnosis not present

## 2020-11-05 DIAGNOSIS — Z79811 Long term (current) use of aromatase inhibitors: Secondary | ICD-10-CM | POA: Diagnosis not present

## 2020-11-05 DIAGNOSIS — E039 Hypothyroidism, unspecified: Secondary | ICD-10-CM | POA: Diagnosis not present

## 2020-11-05 DIAGNOSIS — D638 Anemia in other chronic diseases classified elsewhere: Secondary | ICD-10-CM | POA: Diagnosis not present

## 2020-11-05 DIAGNOSIS — Z79899 Other long term (current) drug therapy: Secondary | ICD-10-CM | POA: Diagnosis not present

## 2020-11-05 DIAGNOSIS — Z79890 Hormone replacement therapy (postmenopausal): Secondary | ICD-10-CM | POA: Insufficient documentation

## 2020-11-05 DIAGNOSIS — Z888 Allergy status to other drugs, medicaments and biological substances status: Secondary | ICD-10-CM | POA: Insufficient documentation

## 2020-11-05 DIAGNOSIS — C50412 Malignant neoplasm of upper-outer quadrant of left female breast: Secondary | ICD-10-CM | POA: Diagnosis not present

## 2020-11-05 DIAGNOSIS — Z86000 Personal history of in-situ neoplasm of breast: Secondary | ICD-10-CM | POA: Diagnosis not present

## 2020-11-05 DIAGNOSIS — Z170 Estrogen receptor positive status [ER+]: Secondary | ICD-10-CM

## 2020-11-05 DIAGNOSIS — Z881 Allergy status to other antibiotic agents status: Secondary | ICD-10-CM | POA: Insufficient documentation

## 2020-11-05 DIAGNOSIS — G8918 Other acute postprocedural pain: Secondary | ICD-10-CM | POA: Diagnosis not present

## 2020-11-05 HISTORY — DX: Depression, unspecified: F32.A

## 2020-11-05 HISTORY — DX: Gastro-esophageal reflux disease without esophagitis: K21.9

## 2020-11-05 HISTORY — DX: Anxiety disorder, unspecified: F41.9

## 2020-11-05 HISTORY — DX: Anemia, unspecified: D64.9

## 2020-11-05 HISTORY — PX: BREAST LUMPECTOMY WITH RADIOACTIVE SEED AND SENTINEL LYMPH NODE BIOPSY: SHX6550

## 2020-11-05 HISTORY — DX: Hypothyroidism, unspecified: E03.9

## 2020-11-05 HISTORY — PX: BREAST LUMPECTOMY: SHX2

## 2020-11-05 SURGERY — BREAST LUMPECTOMY WITH RADIOACTIVE SEED AND SENTINEL LYMPH NODE BIOPSY
Anesthesia: General | Site: Breast | Laterality: Left

## 2020-11-05 MED ORDER — FENTANYL CITRATE (PF) 100 MCG/2ML IJ SOLN
100.0000 ug | Freq: Once | INTRAMUSCULAR | Status: AC
  Administered 2020-11-05: 100 ug via INTRAVENOUS

## 2020-11-05 MED ORDER — TECHNETIUM TC 99M TILMANOCEPT KIT
1.0000 | PACK | Freq: Once | INTRAVENOUS | Status: AC | PRN
  Administered 2020-11-05: 1 via INTRADERMAL

## 2020-11-05 MED ORDER — PHENYLEPHRINE 40 MCG/ML (10ML) SYRINGE FOR IV PUSH (FOR BLOOD PRESSURE SUPPORT)
PREFILLED_SYRINGE | INTRAVENOUS | Status: DC | PRN
  Administered 2020-11-05 (×5): 80 ug via INTRAVENOUS

## 2020-11-05 MED ORDER — LIDOCAINE 2% (20 MG/ML) 5 ML SYRINGE
INTRAMUSCULAR | Status: DC | PRN
  Administered 2020-11-05: 100 mg via INTRAVENOUS

## 2020-11-05 MED ORDER — PROPOFOL 10 MG/ML IV BOLUS
INTRAVENOUS | Status: DC | PRN
  Administered 2020-11-05: 120 mg via INTRAVENOUS

## 2020-11-05 MED ORDER — METHYLENE BLUE 0.5 % INJ SOLN
INTRAVENOUS | Status: AC
  Filled 2020-11-05: qty 10

## 2020-11-05 MED ORDER — MIDAZOLAM HCL 2 MG/2ML IJ SOLN
2.0000 mg | Freq: Once | INTRAMUSCULAR | Status: AC
  Administered 2020-11-05: 1 mg via INTRAVENOUS

## 2020-11-05 MED ORDER — BUPIVACAINE HCL (PF) 0.25 % IJ SOLN
INTRAMUSCULAR | Status: AC
  Filled 2020-11-05: qty 30

## 2020-11-05 MED ORDER — CHLORHEXIDINE GLUCONATE CLOTH 2 % EX PADS: 6.0000 | MEDICATED_PAD | Freq: Once | CUTANEOUS | Status: DC

## 2020-11-05 MED ORDER — GABAPENTIN 300 MG PO CAPS
ORAL_CAPSULE | ORAL | Status: AC
  Filled 2020-11-05: qty 1

## 2020-11-05 MED ORDER — CEFAZOLIN SODIUM-DEXTROSE 2-4 GM/100ML-% IV SOLN
INTRAVENOUS | Status: AC
  Filled 2020-11-05: qty 100

## 2020-11-05 MED ORDER — CEFAZOLIN SODIUM-DEXTROSE 2-4 GM/100ML-% IV SOLN
2.0000 g | INTRAVENOUS | Status: AC
  Administered 2020-11-05: 2 g via INTRAVENOUS

## 2020-11-05 MED ORDER — ACETAMINOPHEN 500 MG PO TABS
ORAL_TABLET | ORAL | Status: AC
  Filled 2020-11-05: qty 2

## 2020-11-05 MED ORDER — GABAPENTIN 300 MG PO CAPS
300.0000 mg | ORAL_CAPSULE | ORAL | Status: AC
  Administered 2020-11-05: 300 mg via ORAL

## 2020-11-05 MED ORDER — CELECOXIB 200 MG PO CAPS
200.0000 mg | ORAL_CAPSULE | ORAL | Status: AC
  Administered 2020-11-05: 200 mg via ORAL

## 2020-11-05 MED ORDER — MIDAZOLAM HCL 2 MG/2ML IJ SOLN
INTRAMUSCULAR | Status: AC
  Filled 2020-11-05: qty 2

## 2020-11-05 MED ORDER — ONDANSETRON HCL 4 MG/2ML IJ SOLN
INTRAMUSCULAR | Status: DC | PRN
  Administered 2020-11-05: 4 mg via INTRAVENOUS

## 2020-11-05 MED ORDER — ONDANSETRON HCL 4 MG/2ML IJ SOLN: 4.0000 mg | Freq: Once | INTRAMUSCULAR | Status: DC | PRN

## 2020-11-05 MED ORDER — HYDROCODONE-ACETAMINOPHEN 5-325 MG PO TABS: 1.0000 | ORAL_TABLET | Freq: Four times a day (QID) | ORAL | 0 refills | Status: DC | PRN

## 2020-11-05 MED ORDER — ROPIVACAINE HCL 5 MG/ML IJ SOLN
INTRAMUSCULAR | Status: DC | PRN
  Administered 2020-11-05: 30 mL

## 2020-11-05 MED ORDER — HYDROMORPHONE HCL 1 MG/ML IJ SOLN: 0.2500 mg | INTRAMUSCULAR | Status: DC | PRN

## 2020-11-05 MED ORDER — EPHEDRINE SULFATE 50 MG/ML IJ SOLN
INTRAMUSCULAR | Status: DC | PRN
  Administered 2020-11-05: 10 mg via INTRAVENOUS

## 2020-11-05 MED ORDER — OXYCODONE HCL 5 MG PO TABS: 5.0000 mg | ORAL_TABLET | Freq: Once | ORAL | Status: DC | PRN

## 2020-11-05 MED ORDER — FENTANYL CITRATE (PF) 100 MCG/2ML IJ SOLN
INTRAMUSCULAR | Status: AC
  Filled 2020-11-05: qty 2

## 2020-11-05 MED ORDER — CELECOXIB 200 MG PO CAPS
ORAL_CAPSULE | ORAL | Status: AC
  Filled 2020-11-05: qty 1

## 2020-11-05 MED ORDER — SODIUM CHLORIDE (PF) 0.9 % IJ SOLN
INTRAMUSCULAR | Status: AC
  Filled 2020-11-05: qty 10

## 2020-11-05 MED ORDER — LACTATED RINGERS IV SOLN: INTRAVENOUS | Status: DC

## 2020-11-05 MED ORDER — ACETAMINOPHEN 500 MG PO TABS
1000.0000 mg | ORAL_TABLET | ORAL | Status: AC
  Administered 2020-11-05: 1000 mg via ORAL

## 2020-11-05 MED ORDER — BUPIVACAINE HCL (PF) 0.25 % IJ SOLN
INTRAMUSCULAR | Status: DC | PRN
  Administered 2020-11-05: 20 mL

## 2020-11-05 MED ORDER — DEXAMETHASONE SODIUM PHOSPHATE 10 MG/ML IJ SOLN
INTRAMUSCULAR | Status: DC | PRN
  Administered 2020-11-05: 5 mg via INTRAVENOUS

## 2020-11-05 MED ORDER — OXYCODONE HCL 5 MG/5ML PO SOLN: 5.0000 mg | Freq: Once | ORAL | Status: DC | PRN

## 2020-11-05 SURGICAL SUPPLY — 46 items
ADH SKN CLS APL DERMABOND .7 (GAUZE/BANDAGES/DRESSINGS) ×1
APL PRP STRL LF DISP 70% ISPRP (MISCELLANEOUS) ×1
APPLIER CLIP 9.375 MED OPEN (MISCELLANEOUS) ×2
APR CLP MED 9.3 20 MLT OPN (MISCELLANEOUS) ×1
BINDER BREAST LRG (GAUZE/BANDAGES/DRESSINGS) ×1 IMPLANT
BLADE SURG 15 STRL LF DISP TIS (BLADE) ×1 IMPLANT
BLADE SURG 15 STRL SS (BLADE) ×2
CANISTER SUC SOCK COL 7IN (MISCELLANEOUS) IMPLANT
CANISTER SUCT 1200ML W/VALVE (MISCELLANEOUS) IMPLANT
CHLORAPREP W/TINT 26 (MISCELLANEOUS) ×2 IMPLANT
CLIP APPLIE 9.375 MED OPEN (MISCELLANEOUS) ×1 IMPLANT
COVER BACK TABLE 60X90IN (DRAPES) ×2 IMPLANT
COVER MAYO STAND STRL (DRAPES) ×2 IMPLANT
COVER PROBE W GEL 5X96 (DRAPES) ×2 IMPLANT
COVER WAND RF STERILE (DRAPES) IMPLANT
DECANTER SPIKE VIAL GLASS SM (MISCELLANEOUS) IMPLANT
DERMABOND ADVANCED (GAUZE/BANDAGES/DRESSINGS) ×1
DERMABOND ADVANCED .7 DNX12 (GAUZE/BANDAGES/DRESSINGS) ×1 IMPLANT
DRAPE LAPAROSCOPIC ABDOMINAL (DRAPES) ×2 IMPLANT
DRAPE UTILITY XL STRL (DRAPES) ×2 IMPLANT
ELECT COATED BLADE 2.86 ST (ELECTRODE) ×2 IMPLANT
ELECT REM PT RETURN 9FT ADLT (ELECTROSURGICAL) ×2
ELECTRODE REM PT RTRN 9FT ADLT (ELECTROSURGICAL) ×1 IMPLANT
GLOVE SURG ENC MOIS LTX SZ7.5 (GLOVE) ×2 IMPLANT
GOWN STRL REUS W/ TWL LRG LVL3 (GOWN DISPOSABLE) ×2 IMPLANT
GOWN STRL REUS W/TWL LRG LVL3 (GOWN DISPOSABLE) ×4
ILLUMINATOR WAVEGUIDE N/F (MISCELLANEOUS) IMPLANT
KIT MARKER MARGIN INK (KITS) ×2 IMPLANT
LIGHT WAVEGUIDE WIDE FLAT (MISCELLANEOUS) IMPLANT
NDL HYPO 25X1 1.5 SAFETY (NEEDLE) ×1 IMPLANT
NDL SAFETY ECLIPSE 18X1.5 (NEEDLE) IMPLANT
NEEDLE HYPO 18GX1.5 SHARP (NEEDLE)
NEEDLE HYPO 25X1 1.5 SAFETY (NEEDLE) ×2 IMPLANT
NS IRRIG 1000ML POUR BTL (IV SOLUTION) IMPLANT
PACK BASIN DAY SURGERY FS (CUSTOM PROCEDURE TRAY) ×2 IMPLANT
PENCIL SMOKE EVACUATOR (MISCELLANEOUS) ×2 IMPLANT
SLEEVE SCD COMPRESS KNEE MED (STOCKING) ×2 IMPLANT
SPONGE LAP 18X18 RF (DISPOSABLE) ×2 IMPLANT
SUT MON AB 4-0 PC3 18 (SUTURE) ×4 IMPLANT
SUT SILK 2 0 SH (SUTURE) IMPLANT
SUT VICRYL 3-0 CR8 SH (SUTURE) ×2 IMPLANT
SYR CONTROL 10ML LL (SYRINGE) ×2 IMPLANT
TOWEL GREEN STERILE FF (TOWEL DISPOSABLE) ×2 IMPLANT
TRAY FAXITRON CT DISP (TRAY / TRAY PROCEDURE) ×2 IMPLANT
TUBE CONNECTING 20X1/4 (TUBING) IMPLANT
YANKAUER SUCT BULB TIP NO VENT (SUCTIONS) IMPLANT

## 2020-11-05 NOTE — Interval H&P Note (Signed)
History and Physical Interval Note:  11/05/2020 12:04 PM  Lindsey Price  has presented today for surgery, with the diagnosis of LEFT BREAST CANCER.  The various methods of treatment have been discussed with the patient and family. After consideration of risks, benefits and other options for treatment, the patient has consented to  Procedure(s): LEFT BREAST LUMPECTOMY WITH RADIOACTIVE SEED AND SENTINEL LYMPH NODE BIOPSY (Left) as a surgical intervention.  The patient's history has been reviewed, patient examined, no change in status, stable for surgery.  I have reviewed the patient's chart and labs.  Questions were answered to the patient's satisfaction.     Autumn Messing III

## 2020-11-05 NOTE — Transfer of Care (Signed)
Immediate Anesthesia Transfer of Care Note  Patient: Lindsey Price  Procedure(s) Performed: LEFT BREAST LUMPECTOMY WITH RADIOACTIVE SEED AND SENTINEL LYMPH NODE BIOPSY (Left Breast)  Patient Location: PACU  Anesthesia Type:General  Level of Consciousness: drowsy  Airway & Oxygen Therapy: Patient Spontanous Breathing and Patient connected to nasal cannula oxygen  Post-op Assessment: Report given to RN and Post -op Vital signs reviewed and stable  Post vital signs: Reviewed and stable  Last Vitals:  Vitals Value Taken Time  BP    Temp    Pulse    Resp    SpO2      Last Pain:  Vitals:   11/05/20 0955  TempSrc: Oral  PainSc: 0-No pain      Patients Stated Pain Goal: 3 (06/11/11 1975)  Complications: No complications documented.

## 2020-11-05 NOTE — Progress Notes (Signed)
Nuc med injections completed. Patient tolerated well.   

## 2020-11-05 NOTE — Progress Notes (Signed)
Assisted Dr. Rose with left, ultrasound guided, pectoralis block. Side rails up, monitors on throughout procedure. See vital signs in flow sheet. Tolerated Procedure well. 

## 2020-11-05 NOTE — Op Note (Addendum)
11/05/2020  1:21 PM  PATIENT:  Lindsey Price  75 y.o. female  PRE-OPERATIVE DIAGNOSIS:  LEFT BREAST CANCER  POST-OPERATIVE DIAGNOSIS:  LEFT BREAST CANCER  PROCEDURE:  Procedure(s): LEFT BREAST LUMPECTOMY WITH RADIOACTIVE SEED LOCALIZATION AND DEEP LEFT AXILLARY SENTINEL LYMPH NODE BIOPSY (Left)  SURGEON:  Surgeon(s) and Role:    * Jovita Kussmaul, MD - Primary  PHYSICIAN ASSISTANT:   ASSISTANTS: none   ANESTHESIA:   local and general  EBL:  10 mL   BLOOD ADMINISTERED:none  DRAINS: none   LOCAL MEDICATIONS USED:  MARCAINE     SPECIMEN:  Source of Specimen:  left breast tissue and sentinel node  DISPOSITION OF SPECIMEN:  PATHOLOGY  COUNTS:  YES  TOURNIQUET:  * No tourniquets in log *  DICTATION: .Dragon Dictation   After informed consent was obtained the patient was brought to the operating room and placed in the supine position on the operating table.  After adequate induction of general anesthesia the patient's left chest, breast, and axillary area were prepped with ChloraPrep, allowed to dry, and draped in usual sterile manner.  An appropriate timeout was performed.  Previously an I-125 seed was placed in the upper outer quadrant of the left breast to mark an area of invasive breast cancer.  Also earlier in the day the patient underwent injection of 1 mCi of technetium sulfur colloid in the subareolar position on the left.  The 2 areas were in fairly close proximity to each other.  Because of this I elected to make a curvilinear incision along the upper outer edge of the left breast between both areas of radioactivity with a 15 blade knife.  The incision was carried through the skin and subcutaneous tissue sharply with the electrocautery.  The neoprobe was set to I-125.  The dissection was then carried into the upper outer quadrant between the breast tissue and the subcutaneous fat and skin until the dissection was beyond the area of the cancer.  I then removed a wedge of  breast tissue sharply with the electrocautery around the radioactive seed while checking the area of radioactivity frequently.  This dissection was carried all the way to the chest wall muscle. Once the specimen was removed it was oriented with the appropriate paint colors.  A specimen radiograph was obtained that showed the clip and seed to be near the center of the specimen.  The specimen was then sent to pathology for further evaluation.  Hemostasis was achieved using the Bovie electrocautery.  The wound was irrigated with saline and infiltrated with more quarter percent Marcaine.  The cavity was marked with clips.  The dissection was then carried from this incision into the deep left axillary space under the direction of the neoprobe.  The neoprobe was set to technetium.  I was able to identify 1 hot lymph node.  This was excised sharply with the electrocautery and the surrounding small vessels and lymphatics were controlled with clips.  Ex vivo counts on this node were approximately 900.  No other hot or palpable nodes were identified in the left axilla.  Hemostasis was achieved using the Bovie electrocautery.  The axilla was then closed with interrupted 3-0 Vicryl stitches.  The lumpectomy cavity was then also closed with interrupted 3-0 Vicryl stitches.  The skin incision was then closed with a running 4-0 Monocryl subcuticular stitch.  Dermabond dressings were applied.  The patient tolerated the procedure well.  At the end of the case all needle sponge and instrument  counts were correct.  The patient was then awakened and taken to recovery in stable condition.  PLAN OF CARE: Discharge to home after PACU  PATIENT DISPOSITION:  PACU - hemodynamically stable.   Delay start of Pharmacological VTE agent (>24hrs) due to surgical blood loss or risk of bleeding: not applicable

## 2020-11-05 NOTE — Anesthesia Procedure Notes (Signed)
Anesthesia Regional Block: Pectoralis block   Pre-Anesthetic Checklist: ,, timeout performed, Correct Patient, Correct Site, Correct Laterality, Correct Procedure, Correct Position, site marked, Risks and benefits discussed,  Surgical consent,  Pre-op evaluation,  At surgeon's request and post-op pain management  Laterality: Left  Prep: chloraprep       Needles:  Injection technique: Single-shot  Needle Type: Echogenic Needle     Needle Length: 9cm      Additional Needles:   Procedures:,,,, ultrasound used (permanent image in chart),,,,  Narrative:  Start time: 11/05/2020 11:36 AM End time: 11/05/2020 11:44 AM Injection made incrementally with aspirations every 5 mL.  Performed by: Personally  Anesthesiologist: Myrtie Soman, MD  Additional Notes: Patient tolerated the procedure well without complications

## 2020-11-05 NOTE — Discharge Instructions (Signed)

## 2020-11-05 NOTE — H&P (Signed)
Lindsey Price  Location: [facility] Patient #: [id number] DOB: November 24, 1945 Married / Language: English / Race: White Female   History of Present Illness  The patient is a 75 year old female who presents for a follow-up for Breast cancer. 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 She denies any breast pain or discharge from the nipple. She did take an anti-estrogen for 5 years but has been off it for quite some time. She was recently hospitalized for an infection in her abdominal cavity and was discharged from the hospital yesterday. She will be following up with gastroenterology and the colorectal surgeon. She was not able to tolerate the oral antibiotics so I switched her to Cipro and Flagyl.   Allergies ACE Inhibitors  Benicar *ANTIHYPERTENSIVES*  Polymyxin B Sulfate *ANTI-INFECTIVE AGENTS - MISC.*  Neosporin *OPHTHALMIC AGENTS*  Allergies Reconciled   Medication History Flonase (50MCG/ACT Suspension, Nasal) Active. Simvastatin (10MG  Tablet, Oral) Active. ZyrTEC (10MG  Tablet, Oral) Active. Aromasin (25MG  Tablet, Oral) Active. Vytorin (10-10MG  Tablet, Oral) Active. Advair Diskus (250-50MCG/DOSE Aero Pow Br Act, Inhalation) Active. Losartan Potassium (50MG  Tablet, Oral) Active. Levothyroxine Sodium (100MCG Tablet, Oral) Active. Effexor XR (75MG  Capsule ER 24HR, Oral) Active. Singulair (10MG  Tablet, Oral) Active. Albuterol Sulfate (108 (90 Base)MCG/ACT Aero Pow Br Act, Inhalation) Active. Medications Reconciled    Review of Systems General Not Present- Appetite Loss, Chills, Fatigue, Fever, Night Sweats, Weight Gain and Weight  Loss. Skin Present- Dryness and Rash. Not Present- Change in Wart/Mole, Hives, Jaundice, New Lesions, Non-Healing Wounds and Ulcer. HEENT Present- Wears glasses/contact lenses. Not Present- Earache, Hearing Loss, Hoarseness, Nose Bleed, Oral Ulcers, Ringing in the Ears, Seasonal Allergies, Sinus Pain, Sore Throat, Visual Disturbances and Yellow Eyes. Respiratory Not Present- Bloody sputum, Chronic Cough, Difficulty Breathing, Snoring and Wheezing. Breast Present- Breast Pain. Not Present- Breast Mass, Nipple Discharge and Skin Changes. Gastrointestinal Not Present- Abdominal Pain, Bloating, Bloody Stool, Change in Bowel Habits, Chronic diarrhea, Constipation, Difficulty Swallowing, Excessive gas, Gets full quickly at meals, Hemorrhoids, Indigestion, Nausea, Rectal Pain and Vomiting. Female Genitourinary Not Present- Frequency, Nocturia, Painful Urination, Pelvic Pain and Urgency. Musculoskeletal Present- Joint Stiffness. Not Present- Back Pain, Joint Pain, Muscle Pain, Muscle Weakness and Swelling of Extremities. Neurological Not Present- Decreased Memory, Fainting, Headaches, Numbness, Seizures, Tingling, Tremor, Trouble walking and Weakness. Psychiatric Not Present- Anxiety, Bipolar, Change in Sleep Pattern, Depression, Fearful and Frequent crying. Endocrine Not Present- Cold Intolerance, Excessive Hunger, Hair Changes, Heat Intolerance, Hot flashes and New Diabetes. Hematology Present- Easy Bruising. Not Present- Excessive bleeding, Gland problems, HIV and Persistent Infections.  Vitals  Weight: 142.38 lb Height: 67in Body Surface Area: 1.75 m Body Mass Index: 22.3 kg/m  Temp.: 98.74F  Pulse: 116 (Regular)  P.OX: 94% (Room air) BP: 118/66(Sitting, Left Arm, Standard)       Physical Exam General Mental Status-Alert. General Appearance-Consistent with stated age. Hydration-Well hydrated. Voice-Normal.  Head and Neck Head-normocephalic, atraumatic with no  lesions or palpable masses. Trachea-midline. Thyroid Gland Characteristics - normal size and consistency.  Eye Eyeball - Bilateral-Extraocular movements intact. Sclera/Conjunctiva - Bilateral-No scleral icterus.  Chest and Lung Exam Chest and lung exam reveals -quiet, even and easy respiratory  effort with no use of accessory muscles and on auscultation, normal breath sounds, no adventitious sounds and normal vocal resonance. Inspection Chest Wall - Normal. Back - normal.  Breast Note: There is no palpable mass in either breast. There is a small mobile palpable lymph node in the left axilla. Other than this there is no other palpable lymphadenopathy.   Cardiovascular Cardiovascular examination reveals -normal heart sounds, regular rate and rhythm with no murmurs and normal pedal pulses bilaterally.  Abdomen Inspection Inspection of the abdomen reveals - No Hernias. Skin - Scar - no surgical scars. Palpation/Percussion Palpation and Percussion of the abdomen reveal - Soft, Non Tender, No Rebound tenderness, No Rigidity (guarding) and No hepatosplenomegaly. Auscultation Auscultation of the abdomen reveals - Bowel sounds normal.  Neurologic Neurologic evaluation reveals -alert and oriented x 3 with no impairment of recent or remote memory. Mental Status-Normal.  Musculoskeletal Normal Exam - Left-Upper Extremity Strength Normal and Lower Extremity Strength Normal. Normal Exam - Right-Upper Extremity Strength Normal and Lower Extremity Strength Normal.  Lymphatic Head & Neck  General Head & Neck Lymphatics: Bilateral - Description - Normal. Axillary  General Axillary Region: Bilateral - Description - Normal. Tenderness - Non Tender. Femoral & Inguinal  Generalized Femoral & Inguinal Lymphatics: Bilateral - Description - Normal. Tenderness - Non Tender.    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 She was recently diagnosed with an 8 mm invasive cancer in the upper outer quadrant of the left breast with clinically negative nodes. I have discussed with her in detail the different options for treatment and at this point she favors breast conservation which I feel is a very reasonable way of treating her cancer. She is also a good candidate for sentinel node biopsy. I have discussed with her in detail the risks and benefits of the operation as well as some of the technical aspects including the use of a radioactive seed for localization and she understands and wishes to proceed. I will go ahead and refer her to medical and radiation oncology to discuss adjuvant therapy. She will also follow-up with her gastroenterologist in one of our colorectal surgeons for some recent infection in the abdomen. She has also been experiencing heartburn which she attributed to the Augmentin. I have asked her to contact her medical doctor in case she needs an EKG evaluation of the heart. This patient encounter took 30 minutes today to perform the following: take history, perform exam, review outside records, interpret imaging, counsel the patient on their diagnosis and document encounter, findings & plan in the EHR Current Plans Referred to Oncology, for evaluation and follow up (Oncology). Routine. Started Cipro 500 MG Oral Tablet, 1 (one) Tablet two times daily, #30, 10/05/2020, Ref. x1. Started metroNIDAZOLE 500 MG Oral Tablet, 1 (one) Tablet three times daily, #45, 10/05/2020, Ref. x1.

## 2020-11-05 NOTE — Anesthesia Preprocedure Evaluation (Signed)
Anesthesia Evaluation  Patient identified by MRN, date of birth, ID band Patient awake    Reviewed: Allergy & Precautions, H&P , NPO status , Patient's Chart, lab work & pertinent test results  Airway Mallampati: II  TM Distance: >3 FB Neck ROM: Full    Dental no notable dental hx.    Pulmonary neg pulmonary ROS, former smoker,    Pulmonary exam normal breath sounds clear to auscultation       Cardiovascular hypertension, Pt. on medications Normal cardiovascular exam Rhythm:Regular Rate:Normal     Neuro/Psych negative neurological ROS  negative psych ROS   GI/Hepatic Neg liver ROS, GERD  Medicated,  Endo/Other  Hypothyroidism   Renal/GU negative Renal ROS  negative genitourinary   Musculoskeletal negative musculoskeletal ROS (+)   Abdominal   Peds negative pediatric ROS (+)  Hematology negative hematology ROS (+)   Anesthesia Other Findings   Reproductive/Obstetrics negative OB ROS                             Anesthesia Physical Anesthesia Plan  ASA: II  Anesthesia Plan: General   Post-op Pain Management:  Regional for Post-op pain   Induction: Intravenous  PONV Risk Score and Plan: 3 and Ondansetron, Dexamethasone and Treatment may vary due to age or medical condition  Airway Management Planned: LMA  Additional Equipment:   Intra-op Plan:   Post-operative Plan: Extubation in OR  Informed Consent: I have reviewed the patients History and Physical, chart, labs and discussed the procedure including the risks, benefits and alternatives for the proposed anesthesia with the patient or authorized representative who has indicated his/her understanding and acceptance.     Dental advisory given  Plan Discussed with: CRNA and Surgeon  Anesthesia Plan Comments:         Anesthesia Quick Evaluation

## 2020-11-05 NOTE — Anesthesia Postprocedure Evaluation (Signed)
Anesthesia Post Note  Patient: Lindsey Price  Procedure(s) Performed: LEFT BREAST LUMPECTOMY WITH RADIOACTIVE SEED AND SENTINEL LYMPH NODE BIOPSY (Left Breast)     Patient location during evaluation: PACU Anesthesia Type: General Level of consciousness: awake and alert Pain management: pain level controlled Vital Signs Assessment: post-procedure vital signs reviewed and stable Respiratory status: spontaneous breathing, nonlabored ventilation, respiratory function stable and patient connected to nasal cannula oxygen Cardiovascular status: blood pressure returned to baseline and stable Postop Assessment: no apparent nausea or vomiting Anesthetic complications: no   No complications documented.  Last Vitals:  Vitals:   11/05/20 1345 11/05/20 1426  BP: 113/63 (!) 104/54  Pulse: 72 74  Resp: (!) 9 13  Temp:  36.5 C  SpO2: 97% 95%    Last Pain:  Vitals:   11/05/20 1355  TempSrc:   PainSc: 0-No pain                 Cyle Kenyon S

## 2020-11-05 NOTE — Anesthesia Procedure Notes (Signed)
Procedure Name: LMA Insertion Date/Time: 11/05/2020 12:26 PM Performed by: Imagene Riches, CRNA Pre-anesthesia Checklist: Patient identified, Emergency Drugs available, Suction available and Patient being monitored Patient Re-evaluated:Patient Re-evaluated prior to induction Oxygen Delivery Method: Circle System Utilized Preoxygenation: Pre-oxygenation with 100% oxygen Induction Type: IV induction Ventilation: Mask ventilation without difficulty LMA: LMA inserted LMA Size: 4.0 Number of attempts: 1 Airway Equipment and Method: Bite block Placement Confirmation: positive ETCO2 Tube secured with: Tape Dental Injury: Teeth and Oropharynx as per pre-operative assessment

## 2020-11-08 ENCOUNTER — Telehealth: Payer: Self-pay

## 2020-11-08 ENCOUNTER — Encounter (HOSPITAL_BASED_OUTPATIENT_CLINIC_OR_DEPARTMENT_OTHER): Payer: Self-pay | Admitting: General Surgery

## 2020-11-08 LAB — SURGICAL PATHOLOGY

## 2020-11-08 NOTE — Telephone Encounter (Signed)
LVM

## 2020-11-08 NOTE — Addendum Note (Signed)
Addendum  created 11/08/20 0854 by Tawni Millers, CRNA   Charge Capture section accepted

## 2020-11-10 ENCOUNTER — Other Ambulatory Visit: Payer: Self-pay

## 2020-11-10 NOTE — Progress Notes (Signed)
The proposed treatment discussed in conference is for discussion purposes only and is not a binding recommendation.  The patients have not been physically examined, or presented with their treatment options.  Therefore, final treatment plans cannot be decided.   

## 2020-11-11 ENCOUNTER — Encounter: Payer: Self-pay | Admitting: *Deleted

## 2020-11-11 DIAGNOSIS — C8510 Unspecified B-cell lymphoma, unspecified site: Secondary | ICD-10-CM | POA: Insufficient documentation

## 2020-11-11 NOTE — Progress Notes (Signed)
Patient Care Team: Hamrick, Lorin Mercy, MD as PCP - General (Family Medicine) Juanito Doom, MD as Consulting Physician (Pulmonary Disease) Elsie Saas, MD as Consulting Physician (Orthopedic Surgery) Allyn Kenner, MD as Consulting Physician (Dermatology) Katy Apo, MD as Consulting Physician (Ophthalmology) Jovita Kussmaul, MD as Consulting Physician (General Surgery) Nicholas Lose, MD as Consulting Physician (Hematology and Oncology) Garlan Fair, MD as Consulting Physician (Gastroenterology) Delice Bison, Charlestine Massed, NP as Nurse Practitioner (Hematology and Oncology)  DIAGNOSIS:    ICD-10-CM   1. Malignant neoplasm of upper-outer quadrant of left breast in female, estrogen receptor positive ([place])  C50.412    Z17.0   2. 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 Biopsy showed invasive and in situ ductal carcinoma, HER-2 equivocal by IHC (2+), negative by FISH (ratio 1.59), ER+ >95%, PR+ 85%, Ki67 10%.    Malignant neoplasm of left breast in female, estrogen receptor positive ([facility])  10/12/2020 Initial Diagnosis   Malignant neoplasm of left breast in female, estrogen receptor positive ([place])   10/12/2020 Cancer Staging   Staging form: Breast, AJCC 8th Edition - Clinical stage  from 10/12/2020: Stage IA (cT1b, cN0, cM0, G2, ER+, PR+, HER2-) - Signed by Nicholas Lose, MD on 10/15/2020 Stage prefix: Initial diagnosis   11/05/2020 Surgery   Left lumpectomy: Grade 1 IDC, 1.1 cm, intermediate grade DCIS, margins are negative, lymph node -0/1, ER greater than 95%, PR 85%, HER-2 negative, Ki-67 10%   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 with above-mentioned history of right breast DCIS and new left breast cancer. She underwent a left lumpectomy on 11/05/20 with Dr. Marlou Starks for which pathology showed invasive ductal carcinoma with DCIS, grade 1, 1.1cm, clear margins, one left axillary lymph node negative for carcinoma. She presents to the clinic today the pathology report and further treatment.    ALLERGIES:  is allergic to advair diskus [fluticasone-salmeterol] and neosporin [neomycin-polymyxin-gramicidin].  MEDICATIONS:  Current Outpatient Medications  Medication Sig Dispense Refill  . predniSONE (DELTASONE) 20 MG tablet Take 3 tablets (60 mg total) by mouth daily with breakfast. 60 tablet 0  . albuterol (PROAIR HFA) 108 (90 Base) MCG/ACT inhaler Inhale 1-2 puffs into the lungs every 6 (six) hours as needed for wheezing or shortness of breath. 8 g 5  . atorvastatin (LIPITOR) 10 MG tablet Take 1 tablet (10 mg total) by mouth at bedtime. 90 tablet 0  . beclomethasone (QVAR) 40 MCG/ACT inhaler Inhale into  the lungs 2 (two) times daily.    . calcium carbonate (OS-CAL - DOSED IN MG OF ELEMENTAL CALCIUM) 1250 (500 Ca) MG tablet Take 1 tablet by mouth daily.    . clobetasol (TEMOVATE) 0.05 % external solution Apply 1 application topically daily as needed (Scalp irritation).    Marland Kitchen esomeprazole (NEXIUM) 20 MG capsule Take 20 mg by mouth  daily at 12 noon.    . ezetimibe (ZETIA) 10 MG tablet Take 1 tablet (10 mg total) by mouth daily. 90 tablet 3  . fluticasone (FLONASE) 50 MCG/ACT nasal spray PLACE 1 SPRAY INTO BOTH NOSTRILS DAILY. 48 mL 1  . HYDROcodone-acetaminophen (NORCO/VICODIN) 5-325 MG tablet Take 1-2 tablets by mouth every 6 (six) hours as needed for moderate pain or severe pain. 10 tablet 0  . ibuprofen (ADVIL) 600 MG tablet Take 1 tablet (600 mg total) by mouth every 8 (eight) hours as needed. (Patient taking differently: Take 600 mg by mouth every 8 (eight) hours as needed for mild pain.) 30 tablet 0  . levocetirizine (XYZAL) 5 MG tablet Take 5 mg by mouth every evening.    Marland Kitchen levothyroxine (SYNTHROID) 100 MCG tablet TAKE 1 TABLET EVERY OTHER DAY ALTERNATING WITH 88 MCG TABLET (Patient taking differently: Take 100 mcg by mouth See admin instructions. TAKE 100 MCG TABLET EVERY OTHER DAY ALTERNATING WITH 88 MCG TABLET) 45 tablet 1  . levothyroxine (SYNTHROID) 88 MCG tablet * (Patient taking differently: Take 88 mcg by mouth See admin instructions. TAKE 88 MCG TABLET EVERY OTHER DAY ALTERNATING WITH 100 MCG TABLET) 45 tablet 1  . montelukast (SINGULAIR) 10 MG tablet * (Patient taking differently: Take 10 mg by mouth at bedtime.) 90 tablet 1  . Multiple Vitamin (MULTIVITAMIN WITH MINERALS) TABS tablet Take 1 tablet by mouth daily.    Marland Kitchen omega-3 acid ethyl esters (LOVAZA) 1 g capsule TAKE 2 CAPSULES (2 G TOTAL) BY MOUTH 2 (TWO) TIMES DAILY. 360 capsule 0  . traMADol (ULTRAM) 50 MG tablet Take 1 tablet (50 mg total) by mouth every 6 (six) hours as needed (mild pain). 10 tablet 0  . venlafaxine XR (EFFEXOR-XR) 75 MG 24 hr capsule TAKE 1 CAPSULE BY MOUTH EVERY DAY AT BEDTIME (Patient taking differently: Take 75 mg by mouth daily.) 90 capsule 4  . Vitamin D, Ergocalciferol, (DRISDOL) 1.25 MG (50000 UT) CAPS capsule TAKE 1 CAPSULE BY MOUTH ONCE WEEKLY (Patient taking differently: Take 50,000 Units by mouth every Friday.) 12 capsule 1    No current facility-administered medications for this visit.    PHYSICAL EXAMINATION: ECOG PERFORMANCE STATUS: 1 - Symptomatic but completely ambulatory  Vitals:   11/12/20 0851  BP: (!) 95/53  Pulse: 91  Resp: 18  Temp: 97.9 F (36.6 C)  SpO2: 99%   Filed Weights   11/12/20 0851  Weight: 126 lb 1.6 oz (57.2 kg)     LABORATORY DATA:  I have reviewed the data as listed CMP Latest Ref Rng & Units 11/12/2020 10/02/2020 10/01/2020  Glucose 70 - 99 mg/dL 89 94 120(H)  BUN 8 - 23 mg/dL _0 Creatinine 0.44 - 1.00 mg/dL 0.70 0.77 0.76  Sodium 135 - 145 mmol/L 135 136 136  Potassium 3.5 - 5.1 mmol/L 3.9 3.5 4.1  Chloride 98 - 111 mmol/L 103 101 104  CO2 22 - 32 mmol/L 28 23 21(L)  Calcium 8.9 - 10.3 mg/dL 8.7(L) 8.4(L) 8.7(L)  Total Protein 6.5 - 8.1 g/dL 6.7 - 6.7  Total Bilirubin 0.3 - 1.2 mg/dL 0.5 -  0.7  Alkaline Phos 38 - 126 U/L 112 - 86  AST 15 - 41 U/L 19 - 14(L)  ALT 0 - 44 U/L 25 - 17    Lab Results  Component Value Date   WBC 8.4 11/12/2020   HGB 9.4 (L) 11/12/2020   HCT 29.7 (L) 11/12/2020   MCV 79.4 (L) 11/12/2020   PLT 388 11/12/2020   NEUTROABS 5.2 11/12/2020    ASSESSMENT & PLAN:  Malignant neoplasm of left breast in female, estrogen receptor positive ([facility]) 11/05/2020:Left lumpectomy: Grade 1 IDC, 1.1 cm, intermediate grade DCIS, margins are negative, lymph node -0/1, ER greater than 95%, PR 85%, HER-2 negative, Ki-67 10%  Pathology counseling: I discussed the final pathology report of the patient provided  a copy of this report. I discussed the margins as well as lymph node surgeries. We also discussed the final staging along with previously performed ER/PR and HER-2/neu testing.  Treatment plan: 1.  Adjuvant radiation therapy followed by 2. 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 She has partial colonic  obstruction with circumferential mass lesion. Prelim biopsies revealed large B-cell lymphoma positive for CD20, BCL6, BCL2, CD45, CD21 and MUM1, negative for CD10, CD30 and EBV, Ki-67 elevated  Differential diagnosis: Diffuse large B-cell lymphoma (activated B-cell type of aggressive B-cell lymphoma double hit) FISH for MYC, BCL2 and BCL6 are pending  I discussed with the patient that the colonic obstructive lesion being a lymphoma require systemic chemotherapy.  Based on the CT scan findings of partial bowel obstruction, Dr.Kale and I decided that she needs to be admitted to the hospital for systemic chemotherapy.  He would recommend dose adjusted EPOCH-R regimen.  I placed the orders for the treatment.  We requested bed to be available for Monday.    In the hospital she will need a port placement, echocardiogram.  We obtain blood work today for hepatitis B C HIV and CBC CMP and LDH. Dr. Lenon Curt will be the admitting physician for the inpatient chemotherapy.    Orders Placed This Encounter  Procedures  . CBC with Differential (Cancer Center Only)    Standing Status:   Future    Number of Occurrences:   1    Standing Expiration Date:   11/12/2021  . CMP ([facility] only)    Standing Status:   Future    Number of Occurrences:   1    Standing Expiration Date:   11/12/2021  . Lactate dehydrogenase    Standing Status:   Future    Number of Occurrences:   1    Standing Expiration Date:   11/12/2021  . Hepatitis C antibody    Standing Status:   Future    Number of Occurrences:   1    Standing Expiration Date:   11/12/2021  . Hepatitis B surface antigen    Standing Status:   Future    Number of Occurrences:   1    Standing Expiration Date:   11/12/2021  . HIV antibody (with reflex)    Standing Status:   Future    Number of Occurrences:   1    Standing Expiration Date:   11/12/2021  . Protime-INR    Standing Status:   Future    Number of Occurrences:   1    Standing Expiration Date:    11/12/2021   The patient has a good  understanding of the overall plan. she agrees with it. she will call with any problems that may develop before the next visit here.  Total time spent: 45 mins including face to face time and time spent for planning, charting and coordination of care  Rulon Eisenmenger, MD, MPH 11/12/2020  I, Molly Dorshimer, am acting as scribe for Dr. Nicholas Lose.  I have reviewed the above documentation for accuracy and completeness, and I agree with the above.

## 2020-11-11 NOTE — Assessment & Plan Note (Signed)
11/05/2020:Left lumpectomy: Grade 1 IDC, 1.1 cm, intermediate grade DCIS, margins are negative, lymph node -0/1, ER greater than 95%, PR 85%, HER-2 negative, Ki-67 10%  Pathology counseling: I discussed the final pathology report of the patient provided  a copy of this report. I discussed the margins as well as lymph node surgeries. We also discussed the final staging along with previously performed ER/PR and HER-2/neu testing.  Treatment plan: 1.  Adjuvant radiation therapy followed by 2. adjuvant antiestrogen therapy  Return to clinic at the end of her radiation to start antiestrogen therapy

## 2020-11-11 NOTE — Assessment & Plan Note (Signed)
complex RLQ inflammatory mass and SBO. She has partial colonic obstruction with circumferential mass lesion. Prelim biopsies revealed large B-cell lymphoma positive for CD20, BCL6, BCL2, CD45, CD21 and MUM1, negative for CD10, CD30 and EBV, Ki-67 elevated  Differential diagnosis: Diffuse large B-cell lymphoma (activated B-cell type of aggressive B-cell lymphoma double hit) FISH for MYC, BCL2 and BCL6 are pending  I discussed with the patient that the colonic obstructive lesion being a lymphoma require systemic chemotherapy. I would like to refer her to see Dr.Kale who is our expert on lymphomas.

## 2020-11-12 ENCOUNTER — Encounter: Payer: Self-pay | Admitting: *Deleted

## 2020-11-12 ENCOUNTER — Other Ambulatory Visit: Payer: Self-pay

## 2020-11-12 ENCOUNTER — Telehealth: Payer: Self-pay | Admitting: *Deleted

## 2020-11-12 ENCOUNTER — Inpatient Hospital Stay: Payer: PPO

## 2020-11-12 ENCOUNTER — Inpatient Hospital Stay: Payer: PPO | Attending: Hematology and Oncology | Admitting: Hematology and Oncology

## 2020-11-12 DIAGNOSIS — D509 Iron deficiency anemia, unspecified: Secondary | ICD-10-CM | POA: Diagnosis not present

## 2020-11-12 DIAGNOSIS — C8510 Unspecified B-cell lymphoma, unspecified site: Secondary | ICD-10-CM

## 2020-11-12 DIAGNOSIS — C8519 Unspecified B-cell lymphoma, extranodal and solid organ sites: Secondary | ICD-10-CM | POA: Diagnosis not present

## 2020-11-12 DIAGNOSIS — C50412 Malignant neoplasm of upper-outer quadrant of left female breast: Secondary | ICD-10-CM | POA: Insufficient documentation

## 2020-11-12 DIAGNOSIS — Z5189 Encounter for other specified aftercare: Secondary | ICD-10-CM | POA: Insufficient documentation

## 2020-11-12 DIAGNOSIS — Z170 Estrogen receptor positive status [ER+]: Secondary | ICD-10-CM | POA: Diagnosis not present

## 2020-11-12 DIAGNOSIS — Z86000 Personal history of in-situ neoplasm of breast: Secondary | ICD-10-CM | POA: Diagnosis not present

## 2020-11-12 LAB — CMP (CANCER CENTER ONLY)
ALT: 25 U/L (ref 0–44)
AST: 19 U/L (ref 15–41)
Albumin: 2.6 g/dL — ABNORMAL LOW (ref 3.5–5.0)
Alkaline Phosphatase: 112 U/L (ref 38–126)
Anion gap: 4 — ABNORMAL LOW (ref 5–15)
BUN: 18 mg/dL (ref 8–23)
CO2: 28 mmol/L (ref 22–32)
Calcium: 8.7 mg/dL — ABNORMAL LOW (ref 8.9–10.3)
Chloride: 103 mmol/L (ref 98–111)
Creatinine: 0.7 mg/dL (ref 0.44–1.00)
GFR, Estimated: >60 mL/min (ref >60)
Glucose, Bld: 89 mg/dL (ref 70–99)
Potassium: 3.9 mmol/L (ref 3.5–5.1)
Sodium: 135 mmol/L (ref 135–145)
Total Bilirubin: 0.5 mg/dL (ref 0.3–1.2)
Total Protein: 6.7 g/dL (ref 6.5–8.1)

## 2020-11-12 LAB — LACTATE DEHYDROGENASE: LDH: 118 U/L (ref 98–192)

## 2020-11-12 LAB — PROTIME-INR
INR: 1.2 (ref 0.8–1.2)
Prothrombin Time: 15 seconds (ref 11.4–15.2)

## 2020-11-12 LAB — CBC WITH DIFFERENTIAL (CANCER CENTER ONLY)
Abs Immature Granulocytes: 0.04 10*3/uL (ref 0.00–0.07)
Basophils Absolute: 0 10*3/uL (ref 0.0–0.1)
Basophils Relative: 0 %
Eosinophils Absolute: 0.1 10*3/uL (ref 0.0–0.5)
Eosinophils Relative: 2 %
HCT: 29.7 % — ABNORMAL LOW (ref 36.0–46.0)
Hemoglobin: 9.4 g/dL — ABNORMAL LOW (ref 12.0–15.0)
Immature Granulocytes: 1 %
Lymphocytes Relative: 20 %
Lymphs Abs: 1.7 10*3/uL (ref 0.7–4.0)
MCH: 25.1 pg — ABNORMAL LOW (ref 26.0–34.0)
MCHC: 31.6 g/dL (ref 30.0–36.0)
MCV: 79.4 fL — ABNORMAL LOW (ref 80.0–100.0)
Monocytes Absolute: 1.4 10*3/uL — ABNORMAL HIGH (ref 0.1–1.0)
Monocytes Relative: 16 %
Neutro Abs: 5.2 10*3/uL (ref 1.7–7.7)
Neutrophils Relative %: 61 %
Platelet Count: 388 10*3/uL (ref 150–400)
RBC: 3.74 MIL/uL — ABNORMAL LOW (ref 3.87–5.11)
RDW: 16.1 % — ABNORMAL HIGH (ref 11.5–15.5)
WBC Count: 8.4 10*3/uL (ref 4.0–10.5)
nRBC: 0 % (ref 0.0–0.2)

## 2020-11-12 LAB — HEPATITIS C ANTIBODY: HCV Ab: NONREACTIVE

## 2020-11-12 LAB — HIV ANTIBODY (ROUTINE TESTING W REFLEX): HIV Screen 4th Generation wRfx: NONREACTIVE

## 2020-11-12 LAB — HEPATITIS B SURFACE ANTIGEN: Hepatitis B Surface Ag: NONREACTIVE

## 2020-11-12 MED ORDER — PREDNISONE 20 MG PO TABS: 60.0000 mg | ORAL_TABLET | Freq: Every day | ORAL | 0 refills | Status: DC

## 2020-11-12 NOTE — Progress Notes (Signed)
START ON PATHWAY REGIMEN - Lymphoma and CLL     A cycle is every 21 days:     Prednisone      Rituximab-xxxx      Etoposide      Doxorubicin      Vincristine      Cyclophosphamide      Filgrastim-xxxx   **Always confirm dose/schedule in your pharmacy ordering system**  Patient Characteristics: Double Hit Lymphoma, First Line Disease Type: Not Applicable Disease Type: Double Hit Lymphoma Disease Type: Not Applicable Line of therapy: First Line Intent of Therapy: Curative Intent, Discussed with Patient 

## 2020-11-12 NOTE — Telephone Encounter (Signed)
Transferred to Dr. Grier Mitts care today. Inpatient admission scheduled for 11/15/20 for EPOCH-R.  Patient scheduled for Covid screening 11/13/20.  Contacted patient - she verbalized understanding of plan. Email sent to #[email or web address] with patient information.  Marland Kitchen

## 2020-11-12 NOTE — Progress Notes (Unsigned)
Per MD request, RN placed call to bed placement at [facility] to arrange for AM bed placement on Monday 11/15/20.  RN spoke with Raquel Sarna who states he will call pt Sunday evening/ morning AM for direct admission.  Pt notified and verbalized understanding.

## 2020-11-13 ENCOUNTER — Other Ambulatory Visit (HOSPITAL_COMMUNITY)
Admission: RE | Admit: 2020-11-13 | Discharge: 2020-11-13 | Disposition: A | Discharge status: Home / Self Care | Payer: PPO | Source: Ambulatory Visit | Attending: Hematology | Admitting: Hematology

## 2020-11-13 DIAGNOSIS — E785 Hyperlipidemia, unspecified: Secondary | ICD-10-CM | POA: Diagnosis present

## 2020-11-13 DIAGNOSIS — Z01812 Encounter for preprocedural laboratory examination: Secondary | ICD-10-CM | POA: Insufficient documentation

## 2020-11-13 DIAGNOSIS — F419 Anxiety disorder, unspecified: Secondary | ICD-10-CM | POA: Diagnosis present

## 2020-11-13 DIAGNOSIS — Z20822 Contact with and (suspected) exposure to covid-19: Secondary | ICD-10-CM | POA: Insufficient documentation

## 2020-11-13 DIAGNOSIS — E538 Deficiency of other specified B group vitamins: Secondary | ICD-10-CM | POA: Diagnosis present

## 2020-11-13 DIAGNOSIS — Z801 Family history of malignant neoplasm of trachea, bronchus and lung: Secondary | ICD-10-CM | POA: Diagnosis not present

## 2020-11-13 DIAGNOSIS — N951 Menopausal and female climacteric states: Secondary | ICD-10-CM | POA: Diagnosis present

## 2020-11-13 DIAGNOSIS — E039 Hypothyroidism, unspecified: Secondary | ICD-10-CM | POA: Diagnosis present

## 2020-11-13 DIAGNOSIS — D500 Iron deficiency anemia secondary to blood loss (chronic): Secondary | ICD-10-CM | POA: Diagnosis present

## 2020-11-13 DIAGNOSIS — Z803 Family history of malignant neoplasm of breast: Secondary | ICD-10-CM | POA: Diagnosis not present

## 2020-11-13 DIAGNOSIS — Z170 Estrogen receptor positive status [ER+]: Secondary | ICD-10-CM | POA: Diagnosis not present

## 2020-11-13 DIAGNOSIS — Z5111 Encounter for antineoplastic chemotherapy: Secondary | ICD-10-CM | POA: Diagnosis present

## 2020-11-13 DIAGNOSIS — K219 Gastro-esophageal reflux disease without esophagitis: Secondary | ICD-10-CM | POA: Diagnosis present

## 2020-11-13 DIAGNOSIS — C8510 Unspecified B-cell lymphoma, unspecified site: Secondary | ICD-10-CM | POA: Diagnosis not present

## 2020-11-13 DIAGNOSIS — Z0189 Encounter for other specified special examinations: Secondary | ICD-10-CM | POA: Diagnosis not present

## 2020-11-13 DIAGNOSIS — Z800 Family history of malignant neoplasm of digestive organs: Secondary | ICD-10-CM | POA: Diagnosis not present

## 2020-11-13 DIAGNOSIS — I10 Essential (primary) hypertension: Secondary | ICD-10-CM | POA: Diagnosis present

## 2020-11-13 DIAGNOSIS — Z87891 Personal history of nicotine dependence: Secondary | ICD-10-CM | POA: Diagnosis not present

## 2020-11-13 DIAGNOSIS — C8333 Diffuse large B-cell lymphoma, intra-abdominal lymph nodes: Secondary | ICD-10-CM | POA: Diagnosis present

## 2020-11-13 DIAGNOSIS — Z79811 Long term (current) use of aromatase inhibitors: Secondary | ICD-10-CM | POA: Diagnosis not present

## 2020-11-13 DIAGNOSIS — Z853 Personal history of malignant neoplasm of breast: Secondary | ICD-10-CM | POA: Diagnosis not present

## 2020-11-13 DIAGNOSIS — F32A Depression, unspecified: Secondary | ICD-10-CM | POA: Diagnosis present

## 2020-11-13 LAB — SARS CORONAVIRUS 2 (TAT 6-24 HRS): SARS Coronavirus 2: NEGATIVE

## 2020-11-15 ENCOUNTER — Inpatient Hospital Stay (HOSPITAL_COMMUNITY)
Admission: RE | Admit: 2020-11-15 | Discharge: 2020-11-19 | DRG: 847 | Disposition: A | Discharge status: Home / Self Care | Payer: PPO | Source: Ambulatory Visit | Attending: Hematology | Admitting: Hematology

## 2020-11-15 ENCOUNTER — Inpatient Hospital Stay: Payer: Self-pay

## 2020-11-15 ENCOUNTER — Encounter: Payer: Self-pay | Admitting: *Deleted

## 2020-11-15 ENCOUNTER — Encounter (HOSPITAL_COMMUNITY): Payer: Self-pay | Admitting: Hematology

## 2020-11-15 ENCOUNTER — Other Ambulatory Visit: Payer: Self-pay

## 2020-11-15 ENCOUNTER — Inpatient Hospital Stay (HOSPITAL_COMMUNITY): Payer: PPO

## 2020-11-15 DIAGNOSIS — Z87891 Personal history of nicotine dependence: Secondary | ICD-10-CM | POA: Diagnosis not present

## 2020-11-15 DIAGNOSIS — D500 Iron deficiency anemia secondary to blood loss (chronic): Secondary | ICD-10-CM | POA: Diagnosis present

## 2020-11-15 DIAGNOSIS — Z803 Family history of malignant neoplasm of breast: Secondary | ICD-10-CM | POA: Diagnosis not present

## 2020-11-15 DIAGNOSIS — I10 Essential (primary) hypertension: Secondary | ICD-10-CM | POA: Diagnosis present

## 2020-11-15 DIAGNOSIS — Z79811 Long term (current) use of aromatase inhibitors: Secondary | ICD-10-CM | POA: Diagnosis not present

## 2020-11-15 DIAGNOSIS — E46 Unspecified protein-calorie malnutrition: Secondary | ICD-10-CM

## 2020-11-15 DIAGNOSIS — Z5111 Encounter for antineoplastic chemotherapy: Secondary | ICD-10-CM | POA: Diagnosis present

## 2020-11-15 DIAGNOSIS — E785 Hyperlipidemia, unspecified: Secondary | ICD-10-CM | POA: Diagnosis present

## 2020-11-15 DIAGNOSIS — Z801 Family history of malignant neoplasm of trachea, bronchus and lung: Secondary | ICD-10-CM

## 2020-11-15 DIAGNOSIS — E039 Hypothyroidism, unspecified: Secondary | ICD-10-CM | POA: Diagnosis present

## 2020-11-15 DIAGNOSIS — F419 Anxiety disorder, unspecified: Secondary | ICD-10-CM | POA: Diagnosis present

## 2020-11-15 DIAGNOSIS — Z20822 Contact with and (suspected) exposure to covid-19: Secondary | ICD-10-CM | POA: Diagnosis present

## 2020-11-15 DIAGNOSIS — Z800 Family history of malignant neoplasm of digestive organs: Secondary | ICD-10-CM | POA: Diagnosis not present

## 2020-11-15 DIAGNOSIS — C8510 Unspecified B-cell lymphoma, unspecified site: Secondary | ICD-10-CM | POA: Diagnosis not present

## 2020-11-15 DIAGNOSIS — F32A Depression, unspecified: Secondary | ICD-10-CM | POA: Diagnosis present

## 2020-11-15 DIAGNOSIS — K219 Gastro-esophageal reflux disease without esophagitis: Secondary | ICD-10-CM | POA: Diagnosis present

## 2020-11-15 DIAGNOSIS — C8333 Diffuse large B-cell lymphoma, intra-abdominal lymph nodes: Secondary | ICD-10-CM | POA: Diagnosis present

## 2020-11-15 DIAGNOSIS — Z853 Personal history of malignant neoplasm of breast: Secondary | ICD-10-CM

## 2020-11-15 DIAGNOSIS — E538 Deficiency of other specified B group vitamins: Secondary | ICD-10-CM | POA: Diagnosis present

## 2020-11-15 DIAGNOSIS — Z170 Estrogen receptor positive status [ER+]: Secondary | ICD-10-CM | POA: Diagnosis not present

## 2020-11-15 DIAGNOSIS — N951 Menopausal and female climacteric states: Secondary | ICD-10-CM | POA: Diagnosis present

## 2020-11-15 DIAGNOSIS — Z0189 Encounter for other specified special examinations: Secondary | ICD-10-CM | POA: Diagnosis not present

## 2020-11-15 LAB — CBC WITH DIFFERENTIAL/PLATELET
Abs Immature Granulocytes: 0.04 10*3/uL (ref 0.00–0.07)
Basophils Absolute: 0 10*3/uL (ref 0.0–0.1)
Basophils Relative: 0 %
Eosinophils Absolute: 0 10*3/uL (ref 0.0–0.5)
Eosinophils Relative: 0 %
HCT: 31.5 % — ABNORMAL LOW (ref 36.0–46.0)
Hemoglobin: 9.6 g/dL — ABNORMAL LOW (ref 12.0–15.0)
Immature Granulocytes: 0 %
Lymphocytes Relative: 10 %
Lymphs Abs: 1.1 10*3/uL (ref 0.7–4.0)
MCH: 24.8 pg — ABNORMAL LOW (ref 26.0–34.0)
MCHC: 30.5 g/dL (ref 30.0–36.0)
MCV: 81.4 fL (ref 80.0–100.0)
Monocytes Absolute: 0.4 10*3/uL (ref 0.1–1.0)
Monocytes Relative: 4 %
Neutro Abs: 8.8 10*3/uL — ABNORMAL HIGH (ref 1.7–7.7)
Neutrophils Relative %: 86 %
Platelets: 422 10*3/uL — ABNORMAL HIGH (ref 150–400)
RBC: 3.87 MIL/uL (ref 3.87–5.11)
RDW: 16 % — ABNORMAL HIGH (ref 11.5–15.5)
WBC: 10.4 10*3/uL (ref 4.0–10.5)
nRBC: 0 % (ref 0.0–0.2)

## 2020-11-15 LAB — LACTATE DEHYDROGENASE: LDH: 90 U/L — ABNORMAL LOW (ref 98–192)

## 2020-11-15 LAB — COMPREHENSIVE METABOLIC PANEL
ALT: 47 U/L — ABNORMAL HIGH (ref 0–44)
AST: 21 U/L (ref 15–41)
Albumin: 3 g/dL — ABNORMAL LOW (ref 3.5–5.0)
Alkaline Phosphatase: 106 U/L (ref 38–126)
Anion gap: 9 (ref 5–15)
BUN: 27 mg/dL — ABNORMAL HIGH (ref 8–23)
CO2: 24 mmol/L (ref 22–32)
Calcium: 8.9 mg/dL (ref 8.9–10.3)
Chloride: 106 mmol/L (ref 98–111)
Creatinine, Ser: 0.75 mg/dL (ref 0.44–1.00)
GFR, Estimated: >60 mL/min (ref >60)
Glucose, Bld: 104 mg/dL — ABNORMAL HIGH (ref 70–99)
Potassium: 3.9 mmol/L (ref 3.5–5.1)
Sodium: 139 mmol/L (ref 135–145)
Total Bilirubin: 0.4 mg/dL (ref 0.3–1.2)
Total Protein: 6.8 g/dL (ref 6.5–8.1)

## 2020-11-15 LAB — URIC ACID: Uric Acid, Serum: 5.6 mg/dL (ref 2.5–7.1)

## 2020-11-15 LAB — FERRITIN: Ferritin: 34 ng/mL (ref 11–307)

## 2020-11-15 LAB — IRON AND TIBC
Iron: 24 ug/dL — ABNORMAL LOW (ref 28–170)
Saturation Ratios: 8 % — ABNORMAL LOW (ref 10.4–31.8)
TIBC: 308 ug/dL (ref 250–450)
UIBC: 284 ug/dL

## 2020-11-15 LAB — ECHOCARDIOGRAM COMPLETE
Area-P 1/2: 4.15 cm2
S' Lateral: 3.2 cm

## 2020-11-15 MED ORDER — LORATADINE 10 MG PO TABS
10.0000 mg | ORAL_TABLET | Freq: Every evening | ORAL | Status: DC
  Administered 2020-11-15 – 2020-11-18 (×4): 10 mg via ORAL
  Filled 2020-11-15 (×4): qty 1

## 2020-11-15 MED ORDER — CLOBETASOL PROPIONATE 0.05 % EX SOLN: 1.0000 "application " | Freq: Every day | CUTANEOUS | Status: DC | PRN

## 2020-11-15 MED ORDER — SODIUM CHLORIDE 0.9% FLUSH: 10.0000 mL | INTRAVENOUS | Status: DC | PRN

## 2020-11-15 MED ORDER — NON FORMULARY: 1.0000 | Freq: Two times a day (BID) | Status: DC

## 2020-11-15 MED ORDER — MONTELUKAST SODIUM 10 MG PO TABS
10.0000 mg | ORAL_TABLET | Freq: Every day | ORAL | Status: DC
  Administered 2020-11-15 – 2020-11-18 (×4): 10 mg via ORAL
  Filled 2020-11-15 (×4): qty 1

## 2020-11-15 MED ORDER — CHLORHEXIDINE GLUCONATE CLOTH 2 % EX PADS
6.0000 | MEDICATED_PAD | Freq: Every day | CUTANEOUS | Status: DC
  Administered 2020-11-15 – 2020-11-18 (×4): 6 via TOPICAL

## 2020-11-15 MED ORDER — EZETIMIBE 10 MG PO TABS: 10.0000 mg | ORAL_TABLET | Freq: Every day | ORAL | Status: DC

## 2020-11-15 MED ORDER — PANTOPRAZOLE SODIUM 40 MG PO TBEC
40.0000 mg | DELAYED_RELEASE_TABLET | Freq: Every day | ORAL | Status: DC
  Administered 2020-11-16 – 2020-11-19 (×4): 40 mg via ORAL
  Filled 2020-11-15 (×4): qty 1

## 2020-11-15 MED ORDER — VINCRISTINE SULFATE CHEMO INJECTION 1 MG/ML
Freq: Once | INTRAVENOUS | Status: AC
  Filled 2020-11-15: qty 8

## 2020-11-15 MED ORDER — SENNOSIDES-DOCUSATE SODIUM 8.6-50 MG PO TABS: 1.0000 | ORAL_TABLET | Freq: Every evening | ORAL | Status: DC | PRN

## 2020-11-15 MED ORDER — ENOXAPARIN SODIUM 40 MG/0.4ML ~~LOC~~ SOLN
40.0000 mg | Freq: Every day | SUBCUTANEOUS | Status: DC
  Administered 2020-11-15 – 2020-11-18 (×4): 40 mg via SUBCUTANEOUS
  Filled 2020-11-15 (×4): qty 0.4

## 2020-11-15 MED ORDER — POLYETHYLENE GLYCOL 3350 17 G PO PACK
17.0000 g | PACK | Freq: Every day | ORAL | Status: DC
  Administered 2020-11-15 – 2020-11-19 (×5): 17 g via ORAL
  Filled 2020-11-15 (×5): qty 1

## 2020-11-15 MED ORDER — HYDROCODONE-ACETAMINOPHEN 5-325 MG PO TABS: 1.0000 | ORAL_TABLET | Freq: Four times a day (QID) | ORAL | Status: DC | PRN

## 2020-11-15 MED ORDER — PREDNISONE 20 MG PO TABS
60.0000 mg | ORAL_TABLET | Freq: Every day | ORAL | Status: DC
  Administered 2020-11-16 – 2020-11-19 (×4): 60 mg via ORAL
  Filled 2020-11-15 (×4): qty 3

## 2020-11-15 MED ORDER — ALBUTEROL SULFATE HFA 108 (90 BASE) MCG/ACT IN AERS
1.0000 | INHALATION_SPRAY | Freq: Four times a day (QID) | RESPIRATORY_TRACT | Status: DC | PRN
  Filled 2020-11-15: qty 6.7

## 2020-11-15 MED ORDER — LEVOTHYROXINE SODIUM 88 MCG PO TABS: 88.0000 ug | ORAL_TABLET | ORAL | Status: DC

## 2020-11-15 MED ORDER — EZETIMIBE 10 MG PO TABS
10.0000 mg | ORAL_TABLET | Freq: Every day | ORAL | Status: DC
  Administered 2020-11-16 – 2020-11-19 (×4): 10 mg via ORAL
  Filled 2020-11-15 (×4): qty 1

## 2020-11-15 MED ORDER — PREDNISONE 50 MG PO TABS
60.0000 mg/m2/d | ORAL_TABLET | Freq: Every day | ORAL | Status: DC
  Filled 2020-11-15: qty 1.5

## 2020-11-15 MED ORDER — VENLAFAXINE HCL ER 75 MG PO CP24
75.0000 mg | ORAL_CAPSULE | Freq: Every day | ORAL | Status: DC
  Administered 2020-11-16 – 2020-11-19 (×4): 75 mg via ORAL
  Filled 2020-11-15 (×4): qty 1

## 2020-11-15 MED ORDER — SODIUM BICARBONATE/SODIUM CHLORIDE MOUTHWASH
Freq: Four times a day (QID) | OROMUCOSAL | Status: DC
  Administered 2020-11-18: 1 via OROMUCOSAL
  Filled 2020-11-15: qty 1000

## 2020-11-15 MED ORDER — FLUTICASONE PROPIONATE 50 MCG/ACT NA SUSP
1.0000 | Freq: Every day | NASAL | Status: DC
  Administered 2020-11-17 – 2020-11-19 (×3): 1 via NASAL
  Filled 2020-11-15: qty 16

## 2020-11-15 MED ORDER — ATORVASTATIN CALCIUM 10 MG PO TABS
10.0000 mg | ORAL_TABLET | Freq: Every day | ORAL | Status: DC
  Administered 2020-11-15 – 2020-11-18 (×4): 10 mg via ORAL
  Filled 2020-11-15 (×4): qty 1

## 2020-11-15 MED ORDER — ALUM & MAG HYDROXIDE-SIMETH 200-200-20 MG/5ML PO SUSP
30.0000 mL | ORAL | Status: DC | PRN
  Administered 2020-11-15 – 2020-11-19 (×4): 30 mL via ORAL
  Filled 2020-11-15 (×4): qty 30

## 2020-11-15 MED ORDER — CALCIUM CARBONATE 1250 (500 CA) MG PO TABS
1.0000 | ORAL_TABLET | Freq: Every day | ORAL | Status: DC
  Administered 2020-11-16 – 2020-11-19 (×4): 500 mg via ORAL
  Filled 2020-11-15 (×4): qty 1

## 2020-11-15 MED ORDER — PROCHLORPERAZINE MALEATE 10 MG PO TABS: 10.0000 mg | ORAL_TABLET | Freq: Four times a day (QID) | ORAL | Status: DC | PRN

## 2020-11-15 MED ORDER — ONDANSETRON HCL 40 MG/20ML IJ SOLN
Freq: Once | INTRAMUSCULAR | Status: AC
  Administered 2020-11-15: 8 mg via INTRAVENOUS
  Filled 2020-11-15: qty 4

## 2020-11-15 MED ORDER — ADULT MULTIVITAMIN W/MINERALS CH
1.0000 | ORAL_TABLET | Freq: Every day | ORAL | Status: DC
  Administered 2020-11-16 – 2020-11-19 (×4): 1 via ORAL
  Filled 2020-11-15 (×4): qty 1

## 2020-11-15 MED ORDER — LEVOTHYROXINE SODIUM 100 MCG PO TABS
100.0000 ug | ORAL_TABLET | Freq: Every day | ORAL | Status: DC
  Administered 2020-11-16 – 2020-11-19 (×4): 100 ug via ORAL
  Filled 2020-11-15 (×5): qty 1

## 2020-11-15 MED ORDER — TRAMADOL HCL 50 MG PO TABS
50.0000 mg | ORAL_TABLET | Freq: Four times a day (QID) | ORAL | Status: DC | PRN
  Administered 2020-11-16: 50 mg via ORAL
  Filled 2020-11-15: qty 1

## 2020-11-15 MED ORDER — LEVOTHYROXINE SODIUM 100 MCG PO TABS: 100.0000 ug | ORAL_TABLET | ORAL | Status: DC

## 2020-11-15 MED ORDER — VITAMIN D (ERGOCALCIFEROL) 1.25 MG (50000 UNIT) PO CAPS
50000.0000 [IU] | ORAL_CAPSULE | ORAL | Status: DC
  Administered 2020-11-19: 50000 [IU] via ORAL
  Filled 2020-11-15: qty 1

## 2020-11-15 MED ORDER — OMEGA-3-ACID ETHYL ESTERS 1 G PO CAPS
2.0000 g | ORAL_CAPSULE | Freq: Two times a day (BID) | ORAL | Status: DC
  Administered 2020-11-15 – 2020-11-19 (×8): 2 g via ORAL
  Filled 2020-11-15 (×8): qty 2

## 2020-11-15 MED ORDER — PREDNISONE 50 MG PO TABS: 60.0000 mg/m2/d | ORAL_TABLET | Freq: Every day | ORAL | Status: DC

## 2020-11-15 NOTE — Progress Notes (Signed)
Peripherally Inserted Central Catheter Placement  The IV Nurse has discussed with the patient and/or persons authorized to consent for the patient, the purpose of this procedure and the potential benefits and risks involved with this procedure.  The benefits include less needle sticks, lab draws from the catheter, and the patient may be discharged home with the catheter. Risks include, but not limited to, infection, bleeding, blood clot (thrombus formation), and puncture of an artery; nerve damage and irregular heartbeat and possibility to perform a PICC exchange if needed/ordered by physician.  Alternatives to this procedure were also discussed.  Bard Power PICC patient education guide, fact sheet on infection prevention and patient information card has been provided to patient /or left at bedside.    PICC Placement Documentation  PICC Double Lumen 11/15/20 PICC Right Brachial 36 cm 0 cm (Active)  Indication for Insertion or Continuance of Line Administration of hyperosmolar/irritating solutions (i.e. TPN, Vancomycin, etc.) 11/15/20 1219  Exposed Catheter (cm) 0 cm 11/15/20 1219  Site Assessment Clean;Dry;Intact 11/15/20 1219  Lumen #1 Status Flushed;Blood return noted;Saline locked 11/15/20 1219  Lumen #2 Status Flushed;Blood return noted;Saline locked 11/15/20 1219  Dressing Type Transparent 11/15/20 1219  Dressing Status Clean;Dry;Intact 11/15/20 1219  Antimicrobial disc in place? Yes 11/15/20 1219  Dressing Change Due 11/22/20 11/15/20 1219       Scotty Court 11/15/2020, 12:21 PM

## 2020-11-15 NOTE — Progress Notes (Signed)
  Echocardiogram 2D Echocardiogram has been performed.  Lindsey Price 11/15/2020, 11:27 AM

## 2020-11-15 NOTE — Progress Notes (Signed)
Per Dr. Irene Limbo keep PO prednisone at 60mg /day and OK to treat with EF read as normal.  He will go see patient today after clinic.

## 2020-11-15 NOTE — H&P (Addendum)
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  The patient has been being followed by Dr. Lindi Adie in our office for her breast cancer.  The patient had a CT of the abdomen/pelvis with contrast due to abdominal pain performed on 10/21/2020 which showed acute colitis/enteritis of the cecum/ascending colon and the associated terminal loops of ileum, acute inflammatory changes contribute to at least a partial small bowel obstruction, primary concern of this inflammatory cystic mass/bowel of the right lower quadrant is malignancy given that the posterior margin is inseparable from the psoas muscle, adnexa, and right ureter.  A colonoscopy was performed on 11/04/2020 which showed an infiltrative and ulcerated partially obstructing large mass was found at 85 cm proximal to the anus. The mass was circumferential.  This mass was biopsied and was consistent with large B-cell lymphoma.  Due to this new finding, the patient was referred to Dr. Irene Limbo for consideration of systemic chemotherapy for treatment of her B-cell lymphoma.  When seen today, the patient reports that she has an increased appetite and some irritability secondary to steroids.  She is hopeful that she will not need to be on these very long.  She states that she has gained about 6 pounds.  She currently denies fevers, chills, headaches, dizziness,  chest pain, shortness of breath, abdominal pain, nausea, vomiting.  Denies constipation and diarrhea.  The patient is seen today for admission prior to cycle #1 of EPOCH-R.    Past Medical History:  Diagnosis Date  . Allergy   . Anemia   . Anxiety   . Arthralgia 09/11/2011  . Asthma    cough variant asthma  . Breast cancer ([place]) 2011   RIGHT BREAST CA  . Breast cancer, left ([place]) 09/2020   left breast IDC  . Cataract   . 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 Depression   . GERD (gastroesophageal reflux disease)   . Hyperlipidemia   . Hypertension   . Hypothyroidism   . Insomnia   . Thyroid disease    hypothyroidism  :  Past Surgical History:  Procedure Laterality Date  . ABDOMINAL HYSTERECTOMY  1975   partial  . BREAST BIOPSY Right 09/10/2013   BENIGN  . BREAST BIOPSY Left 09/07/2011   BENIGN  . BREAST LUMPECTOMY Right 2011  . BREAST LUMPECTOMY WITH RADIOACTIVE SEED AND SENTINEL LYMPH NODE BIOPSY Left 11/05/2020   Procedure: LEFT BREAST LUMPECTOMY WITH RADIOACTIVE SEED AND SENTINEL LYMPH NODE BIOPSY;  Surgeon: Jovita Kussmaul, MD;  Location: [facility];  Service: General;  Laterality: Left;  . CATARACT EXTRACTION, BILATERAL    . COLONOSCOPY  2015  . COLONOSCOPY WITH PROPOFOL N/A 04/14/2014   Procedure: COLONOSCOPY WITH PROPOFOL;  Surgeon: Garlan Fair, MD;  Location: [facility];  Service: Endoscopy;  Laterality: N/A;  . POLYPECTOMY    . ROTATOR CUFF REPAIR Left 04/2013   Dr. Para March  :  Current Facility-Administered Medications  Medication Dose Route  Frequency Provider Last Rate Last Admin  . albuterol (VENTOLIN HFA) 108 (90 Base) MCG/ACT inhaler 1-2 puff  1-2 puff Inhalation Q6H PRN Curcio, Roselie Awkward, NP      . atorvastatin (LIPITOR) tablet 10 mg  10 mg Oral QHS Curcio, Kristin R, NP      . calcium carbonate (OS-CAL - dosed in mg of elemental calcium) tablet 500 mg of elemental calcium  1 tablet Oral Daily Curcio, Kristin R, NP       . clobetasol (TEMOVATE) 0.05 % external solution 1 application  1 application Topical Daily PRN Curcio, Kristin R, NP      . enoxaparin (LOVENOX) injection 40 mg  40 mg Subcutaneous QHS Curcio, Kristin R, NP      . ezetimibe (ZETIA) tablet 10 mg  10 mg Oral Daily Curcio, Kristin R, NP      . fluticasone (FLONASE) 50 MCG/ACT nasal spray 1 spray  1 spray Each Nare Daily Curcio, Kristin R, NP      . HYDROcodone-acetaminophen (NORCO/VICODIN) 5-325 MG per tablet 1-2 tablet  1-2 tablet Oral Q6H PRN Curcio, Roselie Awkward, NP      . levocetirizine (XYZAL) tablet 5 mg  5 mg Oral QPM Curcio, Kristin R, NP      . levothyroxine (SYNTHROID) tablet 100 mcg  100 mcg Oral See admin instructions Curcio, Roselie Awkward, NP      . levothyroxine (SYNTHROID) tablet 88 mcg  88 mcg Oral See admin instructions Curcio, Roselie Awkward, NP      . montelukast (SINGULAIR) tablet 10 mg  10 mg Oral QHS Curcio, Kristin R, NP      . multivitamin with minerals tablet 1 tablet  1 tablet Oral Daily Curcio, Kristin R, NP      . omega-3 acid ethyl esters (LOVAZA) capsule 2 g  2 g Oral BID Curcio, Kristin R, NP      . pantoprazole (PROTONIX) EC tablet 40 mg  40 mg Oral Daily Curcio, Roselie Awkward, NP      . Derrill Memo ON 11/16/2020] predniSONE (DELTASONE) tablet 60 mg  60 mg Oral Q breakfast Curcio, Kristin R, NP      . sodium bicarbonate/sodium chloride mouthwash 1022m   Mouth Rinse QID Curcio, Kristin R, NP      . traMADol (ULTRAM) tablet 50 mg  50 mg Oral Q6H PRN CMikey BussingR, NP      . venlafaxine XR (EFFEXOR-XR) 24 hr capsule 75 mg  75 mg Oral Daily Curcio, KRoselie Awkward NP      . [Derrill MemoON 11/19/2020] Vitamin D (Ergocalciferol) (DRISDOL) capsule 50,000 Units  50,000 Units Oral Q Fri Curcio, KRoselie Awkward NP         Allergies  Allergen Reactions  . Advair Diskus [Fluticasone-Salmeterol] Other (See Comments)    Hoarseness.  . Neosporin [Neomycin-Polymyxin-Gramicidin] Itching  :  Family History  Problem Relation Age of Onset  . Cancer Mother         BREAST  . Cancer Father        pancreatic  . Cancer Sister        sebacous cell carcinoma  . Cancer Maternal Aunt        lung  . Cancer Paternal Uncle        colon, stomach  . Colon cancer Neg Hx   . Colon polyps Neg Hx   . Esophageal cancer Neg Hx   . Rectal cancer Neg Hx   . Stomach cancer Neg Hx   :  Social  History   Socioeconomic History  . Marital status: Married    Spouse name: Not on file  . Number of children: Not on file  . Years of education: Not on file  . Highest education level: Not on file  Occupational History  . Not on file  Tobacco Use  . Smoking status: Former Smoker    Packs/day: 1.00    Years: 15.00    Pack years: 15.00    Types: Cigarettes    Quit date: 12/09/1978    Years since quitting: 41.9  . Smokeless tobacco: Never Used  Vaping Use  . Vaping Use: Never used  Substance and Sexual Activity  . Alcohol use: Yes    Alcohol/week: 7.0 standard drinks    Types: 7 Glasses of wine per week    Comment: SOCIALLY wine  . Drug use: No  . Sexual activity: Yes    Birth control/protection: Surgical  Other Topics Concern  . Not on file  Social History Narrative  . Not on file   Social Determinants of Health   Financial Resource Strain: Not on file  Food Insecurity: Not on file  Transportation Needs: Not on file  Physical Activity: Not on file  Stress: Not on file  Social Connections: Not on file  Intimate Partner Violence: Not At Risk  . Fear of Current or Ex-Partner: No  . Emotionally Abused: No  . Physically Abused: No  . Sexually Abused: No  :  Review of Systems: A comprehensive 14 point review of systems was negative except as noted in the HPI.  Exam: No data found.  General:  well-nourished in no acute distress.   Eyes:  no scleral icterus.   ENT:  There were no oropharyngeal lesions.    Lymphatics:  Negative cervical, supraclavicular or axillary adenopathy.   Respiratory: lungs were clear bilaterally without wheezing or  crackles.   Cardiovascular:  Regular rate and rhythm, S1/S2, without murmur, rub or gallop.  There was no pedal edema.   GI:  abdomen was soft, flat, nontender, nondistended, without organomegaly.   Musculoskeletal:  no spinal tenderness of palpation of vertebral spine.   Skin exam was without echymosis, petichae.   Neuro exam was nonfocal. Patient was alert and oriented.  Attention was good.   Language was appropriate.  Mood was normal without depression.  Speech was not pressured.  Thought content was not tangential.     Lab Results  Component Value Date   WBC 10.4 11/15/2020   HGB 9.6 (L) 11/15/2020   HCT 31.5 (L) 11/15/2020   PLT 422 (H) 11/15/2020   GLUCOSE 104 (H) 11/15/2020   CHOL 94 (L) 12/19/2019   TRIG 54 12/19/2019   HDL 32 (L) 12/19/2019   LDLCALC 49 12/19/2019   ALT 47 (H) 11/15/2020   AST 21 11/15/2020   NA 139 11/15/2020   K 3.9 11/15/2020   CL 106 11/15/2020   CREATININE 0.75 11/15/2020   BUN 27 (H) 11/15/2020   CO2 24 11/15/2020    CT ABDOMEN PELVIS W CONTRAST  Result Date: 10/23/2020 CLINICAL DATA:  75 year old female with abdominal pain. The recent consultation notes from general surgery and gastroenterology make no mention that the patient acknowledges a prior small bowel or colon surgery. Last colonoscopy 10/22/2019 EXAM: CT ABDOMEN AND PELVIS WITH CONTRAST TECHNIQUE: Multidetector CT imaging of the abdomen and pelvis was performed using the standard protocol following bolus administration of intravenous contrast. CONTRAST:  15m OMNIPAQUE IOHEXOL 300 MG/ML  SOLN COMPARISON:  10/02/2020, 10/01/2020  FINDINGS: Lower chest: No acute finding of the lower chest. Scarring/atelectasis in the middle lobe right lower lobe. Rounded suspicious 6 mm nodule at the base of the right lung. Hepatobiliary: Unremarkable appearance of the liver. Unremarkable gallbladder. Pancreas: Unremarkable Spleen: Unremarkable Adrenals/Urinary Tract: Adrenal glands unremarkable. Right kidney  without hydronephrosis or nephrolithiasis. The right ureter in the mid segment is inseparable from the inflammatory changes/serosal margin of the bowel/colon. No hydronephrosis or dilated ureter. Left kidney unremarkable with no hydronephrosis or nephrolithiasis. Subcentimeter low-density lesion in the left likely simple cyst. Unremarkable course of the left ureter. Unremarkable urinary bladder. Stomach/Bowel: - Stomach: Stomach unremarkable. - Small bowel: Proximal small bowel unremarkable. Distal small bowel is again distended. As was previously described, there is a C-shaped loop of small bowel within the pelvis with enteric contrast filling the loop and the ends contiguous with inflammatory changes of the right lower quadrant structure, whether small bowel or colon/cecum. As was previously noted, a normal appearing ileocecal valve cannot be identified. Enteric contrast enters proximal ascending colon. - Appendix: Normal appendix not visualized. - Colon: Similar appearance of a dilated tubular/cystic thick-walled structure in the region of the cecum, with atypical and unexpected anatomy. No ileocecal valve identified. No normal appendix. There are 3 separate ostia too small bowel loops, 2 of which are in continuation with a C-shaped loop of small bowel, as above. The final has a separate ostium (image 56 of series 2. The posterior wall of this inflammatory structure is inseparable from the psoas musculature. The posterior wall of this inflammatory structure is inseparable from the right adnexa/ovary. The posterior wall of this inflammatory structure is inseparable from the mid segment of the right ureter. Vascular/Lymphatic: No aneurysm or dissection. Minimal aortic atherosclerosis. Bilateral iliac arteries and proximal femoral arteries are patent. Unremarkable venous and portal venous structures. Mild atherosclerotic changes of the bilateral renal arteries. Reproductive: Hysterectomy. The right adnexa/ovary is  inseparable from the inflammatory changes, described above in the GI section. Unremarkable left adnexa/broad ligament. Other: Small fat containing umbilical hernia. Musculoskeletal: Degenerative changes of the spine. IMPRESSION: Acute colitis/enteritis of the cecum/ascending colon and the associated terminal loops of ileum. As was recently described on CT imaging, the anatomy of distal ileum, ileocecal valve, cecum is atypical and correlation with patient's prior surgical record would be useful. The acute inflammatory changes contribute to at least a partial small bowel obstruction. Primary concern of this inflammatory cystic mass/bowel of the right lower quadrant is malignancy given that the posterior margin is inseparable from the psoas muscle, adnexa, and the right ureter. Correlation with CEA may be useful. Inflammatory/infectious changes are also a consideration. 6 mm nodule of the right lower lobe, suspicious, and could potentially represent metastasis. At least a non-contrast chest CT at 6-12 months is recommended. If the nodule is stable at time of repeat CT, then future CT at 18-24 months (from today's scan) is considered optional for low-risk patients, but is recommended for high-risk patients. This recommendation follows the consensus statement: Guidelines for Management of Incidental Pulmonary Nodules Detected on CT Images: From the Fleischner Society 2017; Radiology 2017; 284:228-243. Additional ancillary findings as above. Electronically Signed   By: Corrie Mckusick D.O.   On: 10/23/2020 14:00   NM Sentinel Node Inj-No Rpt (Breast)  Result Date: 11/05/2020 Sulfur colloid was injected by the nuclear medicine technologist for melanoma sentinel node.   MM Breast Surgical Specimen  Result Date: 11/05/2020 CLINICAL DATA:  Left lumpectomy for breast cancer. EXAM: SPECIMEN RADIOGRAPH OF THE LEFT BREAST  COMPARISON:  Previous exam(s). FINDINGS: Status post excision of the left breast. The radioactive seed  and ribbon shaped biopsy marker clip are present, completely intact. IMPRESSION: Specimen radiograph of the left breast. Electronically Signed   By: Claudie Revering M.D.   On: 11/05/2020 12:58   MM LT RADIOACTIVE SEED LOC MAMMO GUIDE  Result Date: 11/03/2020 CLINICAL DATA:  75 year old female for radioactive seed localization of LEFT breast cancer prior to lumpectomy. EXAM: MAMMOGRAPHIC GUIDED RADIOACTIVE SEED LOCALIZATION OF THE LEFT BREAST COMPARISON:  Previous exam(s). FINDINGS: Patient presents for radioactive seed localization prior to LEFT lumpectomy. I met with the patient and we discussed the procedure of seed localization including benefits and alternatives. We discussed the high likelihood of a successful procedure. We discussed the risks of the procedure including infection, bleeding, tissue injury and further surgery. We discussed the low dose of radioactivity involved in the procedure. Informed, written consent was given. The usual time-out protocol was performed immediately prior to the procedure. Using mammographic guidance, sterile technique, 1% lidocaine and an I-125 radioactive seed, the RIBBON clip was localized using a LATERAL approach. The follow-up mammogram images confirm the seed in the expected location along the LATERAL INFERIOR aspect of the mass/RIBBON clip and were marked for Dr. Marlou Starks. Follow-up survey of the patient confirms presence of the radioactive seed. Order number of I-125 seed:  [id number]. Total activity:  4.098 millicuries.  Reference Date: 10/13/2020 The patient tolerated the procedure well and was released from the [facility]. She was given instructions regarding seed removal. IMPRESSION: Radioactive seed localization LEFT breast. No apparent complications. Electronically Signed   By: Margarette Canada M.D.   On: 11/03/2020 12:16   [place] EKG SITE RITE  Result Date: 11/15/2020 If Site Rite image not attached, placement could not be confirmed due to current cardiac  rhythm.    CT ABDOMEN PELVIS W CONTRAST  Result Date: 10/23/2020 CLINICAL DATA:  74 year old female with abdominal pain. The recent consultation notes from general surgery and gastroenterology make no mention that the patient acknowledges a prior small bowel or colon surgery. Last colonoscopy 10/22/2019 EXAM: CT ABDOMEN AND PELVIS WITH CONTRAST TECHNIQUE: Multidetector CT imaging of the abdomen and pelvis was performed using the standard protocol following bolus administration of intravenous contrast. CONTRAST:  136m OMNIPAQUE IOHEXOL 300 MG/ML  SOLN COMPARISON:  10/02/2020, 10/01/2020 FINDINGS: Lower chest: No acute finding of the lower chest. Scarring/atelectasis in the middle lobe right lower lobe. Rounded suspicious 6 mm nodule at the base of the right lung. Hepatobiliary: Unremarkable appearance of the liver. Unremarkable gallbladder. Pancreas: Unremarkable Spleen: Unremarkable Adrenals/Urinary Tract: Adrenal glands unremarkable. Right kidney without hydronephrosis or nephrolithiasis. The right ureter in the mid segment is inseparable from the inflammatory changes/serosal margin of the bowel/colon. No hydronephrosis or dilated ureter. Left kidney unremarkable with no hydronephrosis or nephrolithiasis. Subcentimeter low-density lesion in the left likely simple cyst. Unremarkable course of the left ureter. Unremarkable urinary bladder. Stomach/Bowel: - Stomach: Stomach unremarkable. - Small bowel: Proximal small bowel unremarkable. Distal small bowel is again distended. As was previously described, there is a C-shaped loop of small bowel within the pelvis with enteric contrast filling the loop and the ends contiguous with inflammatory changes of the right lower quadrant structure, whether small bowel or colon/cecum. As was previously noted, a normal appearing ileocecal valve cannot be identified. Enteric contrast enters proximal ascending colon. - Appendix: Normal appendix not visualized. - Colon: Similar  appearance of a dilated tubular/cystic thick-walled structure in the region of the cecum, with  atypical and unexpected anatomy. No ileocecal valve identified. No normal appendix. There are 3 separate ostia too small bowel loops, 2 of which are in continuation with a C-shaped loop of small bowel, as above. The final has a separate ostium (image 56 of series 2. The posterior wall of this inflammatory structure is inseparable from the psoas musculature. The posterior wall of this inflammatory structure is inseparable from the right adnexa/ovary. The posterior wall of this inflammatory structure is inseparable from the mid segment of the right ureter. Vascular/Lymphatic: No aneurysm or dissection. Minimal aortic atherosclerosis. Bilateral iliac arteries and proximal femoral arteries are patent. Unremarkable venous and portal venous structures. Mild atherosclerotic changes of the bilateral renal arteries. Reproductive: Hysterectomy. The right adnexa/ovary is inseparable from the inflammatory changes, described above in the GI section. Unremarkable left adnexa/broad ligament. Other: Small fat containing umbilical hernia. Musculoskeletal: Degenerative changes of the spine. IMPRESSION: Acute colitis/enteritis of the cecum/ascending colon and the associated terminal loops of ileum. As was recently described on CT imaging, the anatomy of distal ileum, ileocecal valve, cecum is atypical and correlation with patient's prior surgical record would be useful. The acute inflammatory changes contribute to at least a partial small bowel obstruction. Primary concern of this inflammatory cystic mass/bowel of the right lower quadrant is malignancy given that the posterior margin is inseparable from the psoas muscle, adnexa, and the right ureter. Correlation with CEA may be useful. Inflammatory/infectious changes are also a consideration. 6 mm nodule of the right lower lobe, suspicious, and could potentially represent metastasis. At  least a non-contrast chest CT at 6-12 months is recommended. If the nodule is stable at time of repeat CT, then future CT at 18-24 months (from today's scan) is considered optional for low-risk patients, but is recommended for high-risk patients. This recommendation follows the consensus statement: Guidelines for Management of Incidental Pulmonary Nodules Detected on CT Images: From the Fleischner Society 2017; Radiology 2017; 284:228-243. Additional ancillary findings as above. Electronically Signed   By: Corrie Mckusick D.O.   On: 10/23/2020 14:00   NM Sentinel Node Inj-No Rpt (Breast)  Result Date: 11/05/2020 Sulfur colloid was injected by the nuclear medicine technologist for melanoma sentinel node.   MM Breast Surgical Specimen  Result Date: 11/05/2020 CLINICAL DATA:  Left lumpectomy for breast cancer. EXAM: SPECIMEN RADIOGRAPH OF THE LEFT BREAST COMPARISON:  Previous exam(s). FINDINGS: Status post excision of the left breast. The radioactive seed and ribbon shaped biopsy marker clip are present, completely intact. IMPRESSION: Specimen radiograph of the left breast. Electronically Signed   By: Claudie Revering M.D.   On: 11/05/2020 12:58   MM LT RADIOACTIVE SEED LOC MAMMO GUIDE  Result Date: 11/03/2020 CLINICAL DATA:  75 year old female for radioactive seed localization of LEFT breast cancer prior to lumpectomy. EXAM: MAMMOGRAPHIC GUIDED RADIOACTIVE SEED LOCALIZATION OF THE LEFT BREAST COMPARISON:  Previous exam(s). FINDINGS: Patient presents for radioactive seed localization prior to LEFT lumpectomy. I met with the patient and we discussed the procedure of seed localization including benefits and alternatives. We discussed the high likelihood of a successful procedure. We discussed the risks of the procedure including infection, bleeding, tissue injury and further surgery. We discussed the low dose of radioactivity involved in the procedure. Informed, written consent was given. The usual time-out protocol  was performed immediately prior to the procedure. Using mammographic guidance, sterile technique, 1% lidocaine and an I-125 radioactive seed, the RIBBON clip was localized using a LATERAL approach. The follow-up mammogram images confirm the seed in the expected  location along the LATERAL INFERIOR aspect of the mass/RIBBON clip and were marked for Dr. Marlou Starks. Follow-up survey of the patient confirms presence of the radioactive seed. Order number of I-125 seed:  [id number]. Total activity:  0.626 millicuries.  Reference Date: 10/13/2020 The patient tolerated the procedure well and was released from the [facility]. She was given instructions regarding seed removal. IMPRESSION: Radioactive seed localization LEFT breast. No apparent complications. Electronically Signed   By: Margarette Canada M.D.   On: 11/03/2020 12:16   [place] EKG SITE RITE  Result Date: 11/15/2020 If Site Rite image not attached, placement could not be confirmed due to current cardiac rhythm.   Pathology:  Diagnosis Colon, biopsy - INVOLVEMENT BY LARGE B-CELL LYMPHOMA, SEE COMMENT. Microscopic Comment Fragments of colonic mucosa reveal effacement by a dense lymphoid infiltrate. There is crush artifact, but intact areas reveal sheets of large lymphoid cells with irregular nuclear contours and prominent nucleoli. The cells are positive for CD45, CD20, bcl-6, bcl-2, CD21, and MUM-1. They are negative for CD10, CD30, and EBV in situ hybridization. Ki-67 is elevated. CD3 and CD5 highlight scattered T-cells. The overall findings are consistent with involvement by a large B-cell lymphoma. The differential includes a diffuse large B-cell lymphoma, activated B-cell type or an aggressive B-cell lymphoma (double hit lymphoma). FISH for MYC, BCL-2, and BCL-6 will be attempted and reported in an addendum. Dr. Silverio Decamp was notified on 11/10/2020. Vicente Males MD Pathologist, Electronic Signature (Case signed 11/10/2020)  Assessment and Plan:   1) large  B-cell lymphoma -CT abdomen/pelvis with contrast 10/21/2020 - "Acute colitis/enteritis of the cecum/ascending colon and the associated terminal loops of ileum. As was recently described on CT imaging, the anatomy of distal ileum, ileocecal valve, cecum is atypical and correlation with patient's prior surgical record would be useful. The acute inflammatory changes contribute to at least a partial small bowel obstruction. Primary concern of this inflammatory cystic mass/bowel of the right lower quadrant is malignancy given that the posterior margin is inseparable from the psoas muscle, adnexa, and the right ureter. Correlation with CEA may be useful. Inflammatory/infectious changes are also a consideration." -Colonoscopy performed 11/04/2020- "An infiltrative and ulcerated partially obstructing large mass was found at 85 cm proximal to the anus. 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  Will review prior to proceeding with chemotherapy.  Will monitor daily CBC with differential, CMET, LDH, uric acid. -Obtain stat echocardiogram to evaluate LVEF prior to anthracycline therapy. -PICC line placement as soon as possible to begin chemotherapy. -We will plan to administer EPOCH-R chemotherapy.  Adverse effects have been reviewed including but not limited to alopecia, suppression, nausea, vomiting, constipation, peripheral neuropathy, cardiotoxicity, liver and renal dysfunction as well as possible  infusion reaction during rituximab.  We will attempt to set up formal chemo education by nursing. -We will order ferritin, iron studies, vitamin B12, and folate level. -We will order Compazine to be used as needed for nausea vomiting. -We will start on MiraLAX once daily and Senokot-S as needed for constipation. -  Lovenox for DVT prophylaxis. -Continue home medications.  Mikey Bussing, DNP, AGPCNP-BC, AOCNP   ADDENDUM   .Patient was Personally and independently interviewed, examined and relevant elements of the history of present illness were reviewed in details and an assessment and plan was created. All elements of the patient's history of present illness , assessment and plan were discussed in details with Mikey Bussing, DNP, AGPCNP-BC, AOCNP. The above documentation reflects our combined findings assessment and plan.  Patient's case was also discussed in details with Dr. Nicholas Lose and a plan was formulated.  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 High risk lymphoma FISH panel pending. Pathology reviewed with the patient and patient was educated about the diagnosis. Will need outpatient PET CT scan for accurate staging.  Could not be done prior to admission due to urgency of starting her treatment in the setting of partial small bowel obstruction. Would need to continue to be on a mechanical soft diet and low residue diet to avoid producing obstructive symptomatology.  Her bowel habits have been regular and she has no bowel distention nausea or vomiting since being on steroids. She will get her first cycle of the bulk in the hospital and we will schedule Rituxan as outpatient with her G-CSF. Patient had her chemo education today. Echo was reviewed and shows normal ejection fraction. All of patient's questions were answered in details.  #2 Malignant neoplasm of left breast  in female, estrogen receptor positive ([place]) 11/05/2020:Left lumpectomy: Grade 1 IDC, 1.1 cm, intermediate grade DCIS, margins are negative, lymph node -0/1, ER greater than 95%, PR 85%, HER-2 negative, Ki-67 10% Plan -Patient has had a lumpectomy already. -We discussed that her aggressive large B-cell lymphoma will be the treatment priority at this time. -We anticipate some of the medications include anthracyclines would have beneficial effects on suppressing breast cancer. -Her surgical incisions from lumpectomy have healed well. -After completion of her planned chemotherapy for her large B-cell lymphoma we will revisit considerations for adjuvant radiation therapy adjuvant antiestrogen therapy for her ER/PR positive HER-2 negative breast cancer.  Patient is feeling confident with the treatment plan and is in good spirits. We will continue to follow daily  Sullivan Lone MD MS

## 2020-11-16 DIAGNOSIS — E538 Deficiency of other specified B group vitamins: Secondary | ICD-10-CM | POA: Diagnosis not present

## 2020-11-16 DIAGNOSIS — D500 Iron deficiency anemia secondary to blood loss (chronic): Secondary | ICD-10-CM

## 2020-11-16 DIAGNOSIS — C8510 Unspecified B-cell lymphoma, unspecified site: Secondary | ICD-10-CM | POA: Diagnosis not present

## 2020-11-16 DIAGNOSIS — Z5111 Encounter for antineoplastic chemotherapy: Secondary | ICD-10-CM | POA: Diagnosis not present

## 2020-11-16 LAB — COMPREHENSIVE METABOLIC PANEL
ALT: 31 U/L (ref 0–44)
AST: 14 U/L — ABNORMAL LOW (ref 15–41)
Albumin: 2.5 g/dL — ABNORMAL LOW (ref 3.5–5.0)
Alkaline Phosphatase: 84 U/L (ref 38–126)
Anion gap: 8 (ref 5–15)
BUN: 26 mg/dL — ABNORMAL HIGH (ref 8–23)
CO2: 25 mmol/L (ref 22–32)
Calcium: 8.5 mg/dL — ABNORMAL LOW (ref 8.9–10.3)
Chloride: 104 mmol/L (ref 98–111)
Creatinine, Ser: 0.83 mg/dL (ref 0.44–1.00)
GFR, Estimated: >60 mL/min (ref >60)
Glucose, Bld: 106 mg/dL — ABNORMAL HIGH (ref 70–99)
Potassium: 4.2 mmol/L (ref 3.5–5.1)
Sodium: 137 mmol/L (ref 135–145)
Total Bilirubin: 0.6 mg/dL (ref 0.3–1.2)
Total Protein: 5.8 g/dL — ABNORMAL LOW (ref 6.5–8.1)

## 2020-11-16 LAB — CBC WITH DIFFERENTIAL/PLATELET
Abs Immature Granulocytes: 0.05 10*3/uL (ref 0.00–0.07)
Basophils Absolute: 0 10*3/uL (ref 0.0–0.1)
Basophils Relative: 0 %
Eosinophils Absolute: 0 10*3/uL (ref 0.0–0.5)
Eosinophils Relative: 0 %
HCT: 28.8 % — ABNORMAL LOW (ref 36.0–46.0)
Hemoglobin: 8.9 g/dL — ABNORMAL LOW (ref 12.0–15.0)
Immature Granulocytes: 1 %
Lymphocytes Relative: 18 %
Lymphs Abs: 2 10*3/uL (ref 0.7–4.0)
MCH: 25.1 pg — ABNORMAL LOW (ref 26.0–34.0)
MCHC: 30.9 g/dL (ref 30.0–36.0)
MCV: 81.1 fL (ref 80.0–100.0)
Monocytes Absolute: 1.2 10*3/uL — ABNORMAL HIGH (ref 0.1–1.0)
Monocytes Relative: 11 %
Neutro Abs: 7.9 10*3/uL — ABNORMAL HIGH (ref 1.7–7.7)
Neutrophils Relative %: 70 %
Platelets: 401 10*3/uL — ABNORMAL HIGH (ref 150–400)
RBC: 3.55 MIL/uL — ABNORMAL LOW (ref 3.87–5.11)
RDW: 16 % — ABNORMAL HIGH (ref 11.5–15.5)
WBC: 11.1 10*3/uL — ABNORMAL HIGH (ref 4.0–10.5)
nRBC: 0 % (ref 0.0–0.2)

## 2020-11-16 LAB — URIC ACID: Uric Acid, Serum: 5.7 mg/dL (ref 2.5–7.1)

## 2020-11-16 LAB — FOLATE: Folate: 7.4 ng/mL (ref >5.9)

## 2020-11-16 LAB — VITAMIN B12: Vitamin B-12: 241 pg/mL (ref 180–914)

## 2020-11-16 LAB — LACTATE DEHYDROGENASE: LDH: 72 U/L — ABNORMAL LOW (ref 98–192)

## 2020-11-16 MED ORDER — IBUPROFEN 200 MG PO TABS: 400.0000 mg | ORAL_TABLET | Freq: Four times a day (QID) | ORAL | Status: DC | PRN

## 2020-11-16 MED ORDER — VINCRISTINE SULFATE CHEMO INJECTION 1 MG/ML
Freq: Once | INTRAVENOUS | Status: AC
  Filled 2020-11-16: qty 8

## 2020-11-16 MED ORDER — ONDANSETRON HCL 40 MG/20ML IJ SOLN
Freq: Once | INTRAMUSCULAR | Status: AC
  Administered 2020-11-16: 8 mg via INTRAVENOUS
  Filled 2020-11-16: qty 4

## 2020-11-16 MED ORDER — LORAZEPAM 0.5 MG PO TABS
0.5000 mg | ORAL_TABLET | Freq: Every day | ORAL | Status: DC
  Administered 2020-11-16: 0.5 mg via ORAL
  Filled 2020-11-16: qty 1

## 2020-11-16 MED ORDER — SENNOSIDES-DOCUSATE SODIUM 8.6-50 MG PO TABS
1.0000 | ORAL_TABLET | Freq: Two times a day (BID) | ORAL | Status: DC
  Administered 2020-11-16 – 2020-11-19 (×7): 1 via ORAL
  Filled 2020-11-16 (×7): qty 1

## 2020-11-16 MED ORDER — CYANOCOBALAMIN 1000 MCG/ML IJ SOLN
1000.0000 ug | INTRAMUSCULAR | Status: DC
  Administered 2020-11-16: 1000 ug via SUBCUTANEOUS
  Filled 2020-11-16: qty 1

## 2020-11-16 MED ORDER — SODIUM CHLORIDE 0.9 % IV SOLN
510.0000 mg | Freq: Once | INTRAVENOUS | Status: AC
  Administered 2020-11-16: 510 mg via INTRAVENOUS
  Filled 2020-11-16: qty 510

## 2020-11-16 NOTE — Progress Notes (Addendum)
HEMATOLOGY-ONCOLOGY PROGRESS NOTE  SUBJECTIVE: The patient tolerated day 1 of cycle number one of her chemotherapy well overall.  She reports a mild headache this morning.  States that she uses ibuprofen at home and Tylenol has been ineffective in the past.  She denies mucositis, nausea, vomiting.  No constipation reported this morning.  Reports some mild reflux symptoms.  She is on Protonix already and use as needed Maalox which was effective.  She reported some difficulty sleeping last night.  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 Biopsy showed invasive and in situ ductal carcinoma, HER-2 equivocal by IHC (2+), negative by FISH (ratio 1.59), ER+ >95%, PR+ 85%, Ki67 10%.    Malignant neoplasm of left breast in female, estrogen receptor positive ([place])  10/12/2020 Initial Diagnosis   Malignant neoplasm of left breast in female, estrogen receptor positive ([place])   10/12/2020 Cancer Staging   Staging form: Breast, AJCC 8th Edition - Clinical stage from 10/12/2020: Stage IA (cT1b, cN0, cM0, G2, ER+, PR+, HER2-) - Signed by Nicholas Lose, MD on 10/15/2020 Stage prefix: Initial diagnosis   11/05/2020 Surgery   Left lumpectomy: Grade 1 IDC, 1.1 cm, intermediate grade DCIS, margins are negative, lymph node -0/1, ER greater than 95%, PR 85%, HER-2 negative, Ki-67 10%   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[place])  11/11/2020 Initial Diagnosis   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[place])   11/15/2020 -  Chemotherapy    Patient is on Treatment Plan: IP NON-HODGKINS LYMPHOMA EPOCH Q21D         REVIEW  OF SYSTEMS:   Constitutional: Denies fevers, chills  Eyes: Denies blurriness of vision Ears, nose, mouth, throat, and face: Denies mucositis or sore throat Respiratory: Denies cough, dyspnea or wheezes Cardiovascular: Denies palpitation, chest discomfort Gastrointestinal:  Denies nausea, heartburn or change in bowel habits Skin: Denies abnormal skin rashes Lymphatics: Denies new lymphadenopathy or easy bruising Neurological: Reports a mild headache this morning Behavioral/Psych: Mood is stable, no new changes  Extremities: No lower extremity edema All other systems were reviewed with the patient and are negative.  I have reviewed the past medical history, past surgical history, social history and family history with the patient and they are unchanged from previous note.   PHYSICAL EXAMINATION: ECOG PERFORMANCE STATUS: 1 - Symptomatic but completely ambulatory  Vitals:   11/15/20 2048 11/16/20 0608  BP: 121/71 122/69  Pulse: 62 (!) 53  Resp: 14 14  Temp: 97.7 F (36.5 C) 97.7 F (36.5 C)  SpO2: 96% 99%   There were no vitals filed for this visit.  Intake/Output from previous day: 03/14 0701 - 03/15 0700 In: 584.8 [IV Piggyback:584.8] Out: -   GENERAL:alert, no distress and comfortable SKIN: skin color, texture, turgor are normal, no rashes or significant lesions EYES: normal, Conjunctiva are pink and non-injected, sclera clear OROPHARYNX:no exudate, no erythema and lips, buccal mucosa, and tongue normal  LUNGS: clear to auscultation and percussion with normal breathing effort HEART: regular rate & rhythm and no murmurs and no lower extremity edema ABDOMEN:abdomen soft, non-tender and normal bowel sounds Musculoskeletal:no  cyanosis of digits and no clubbing  NEURO: alert & oriented x 3 with fluent speech, no focal motor/sensory deficits  LABORATORY DATA:  I have reviewed the data as listed CMP Latest Ref Rng & Units 11/16/2020 11/15/2020 11/12/2020  Glucose 70 - 99 mg/dL  106(H) 104(H) 89  BUN 8 - 23 mg/dL 26(H) 27(H) 18  Creatinine 0.44 - 1.00 mg/dL 0.83 0.75 0.70  Sodium 135 - 145 mmol/L 137 139 135  Potassium 3.5 - 5.1 mmol/L 4.2 3.9 3.9  Chloride 98 - 111 mmol/L 104 106 103  CO2 22 - 32 mmol/L _0 Calcium 8.9 - 10.3 mg/dL 8.5(L) 8.9 8.7(L)  Total Protein 6.5 - 8.1 g/dL 5.8(L) 6.8 6.7  Total Bilirubin 0.3 - 1.2 mg/dL 0.6 0.4 0.5  Alkaline Phos 38 - 126 U/L 84 106 112  AST 15 - 41 U/L 14(L) 21 19  ALT 0 - 44 U/L 31 47(H) 25    Lab Results  Component Value Date   WBC 11.1 (H) 11/16/2020   HGB 8.9 (L) 11/16/2020   HCT 28.8 (L) 11/16/2020   MCV 81.1 11/16/2020   PLT 401 (H) 11/16/2020   NEUTROABS 7.9 (H) 11/16/2020    CT ABDOMEN PELVIS W CONTRAST  Result Date: 10/23/2020 CLINICAL DATA:  75 year old female with abdominal pain. The recent consultation notes from general surgery and gastroenterology make no mention that the patient acknowledges a prior small bowel or colon surgery. Last colonoscopy 10/22/2019 EXAM: CT ABDOMEN AND PELVIS WITH CONTRAST TECHNIQUE: Multidetector CT imaging of the abdomen and pelvis was performed using the standard protocol following bolus administration of intravenous contrast. CONTRAST:  169m OMNIPAQUE IOHEXOL 300 MG/ML  SOLN COMPARISON:  10/02/2020, 10/01/2020 FINDINGS: Lower chest: No acute finding of the lower chest. Scarring/atelectasis in the middle lobe right lower lobe. Rounded suspicious 6 mm nodule at the base of the right lung. Hepatobiliary: Unremarkable appearance of the liver. Unremarkable gallbladder. Pancreas: Unremarkable Spleen: Unremarkable Adrenals/Urinary Tract: Adrenal glands unremarkable. Right kidney without hydronephrosis or nephrolithiasis. The right ureter in the mid segment is inseparable from the inflammatory changes/serosal margin of the bowel/colon. No hydronephrosis or dilated ureter. Left kidney unremarkable with no hydronephrosis or nephrolithiasis. Subcentimeter low-density lesion in the  left likely simple cyst. Unremarkable course of the left ureter. Unremarkable urinary bladder. Stomach/Bowel: - Stomach: Stomach unremarkable. - Small bowel: Proximal small bowel unremarkable. Distal small bowel is again distended. As was previously described, there is a C-shaped loop of small bowel within the pelvis with enteric contrast filling the loop and the ends contiguous with inflammatory changes of the right lower quadrant structure, whether small bowel or colon/cecum. As was previously noted, a normal appearing ileocecal valve cannot be identified. Enteric contrast enters proximal ascending colon. - Appendix: Normal appendix not visualized. - Colon: Similar appearance of a dilated tubular/cystic thick-walled structure in the region of the cecum, with atypical and unexpected anatomy. No ileocecal valve identified. No normal appendix. There are 3 separate ostia too small bowel loops, 2 of which are in continuation with a C-shaped loop of small bowel, as above. The final has a separate ostium (image 56 of series 2. The posterior wall of this inflammatory structure is inseparable from the psoas musculature. The posterior wall of this inflammatory structure is inseparable from the right adnexa/ovary. The posterior wall of this inflammatory structure is inseparable from the mid segment of the right ureter. Vascular/Lymphatic: No aneurysm or dissection. Minimal aortic atherosclerosis. Bilateral iliac arteries and proximal femoral arteries are patent. Unremarkable venous  and portal venous structures. Mild atherosclerotic changes of the bilateral renal arteries. Reproductive: Hysterectomy. The right adnexa/ovary is inseparable from the inflammatory changes, described above in the GI section. Unremarkable left adnexa/broad ligament. Other: Small fat containing umbilical hernia. Musculoskeletal: Degenerative changes of the spine. IMPRESSION: Acute colitis/enteritis of the cecum/ascending colon and the associated  terminal loops of ileum. As was recently described on CT imaging, the anatomy of distal ileum, ileocecal valve, cecum is atypical and correlation with patient's prior surgical record would be useful. The acute inflammatory changes contribute to at least a partial small bowel obstruction. Primary concern of this inflammatory cystic mass/bowel of the right lower quadrant is malignancy given that the posterior margin is inseparable from the psoas muscle, adnexa, and the right ureter. Correlation with CEA may be useful. Inflammatory/infectious changes are also a consideration. 6 mm nodule of the right lower lobe, suspicious, and could potentially represent metastasis. At least a non-contrast chest CT at 6-12 months is recommended. If the nodule is stable at time of repeat CT, then future CT at 18-24 months (from today's scan) is considered optional for low-risk patients, but is recommended for high-risk patients. This recommendation follows the consensus statement: Guidelines for Management of Incidental Pulmonary Nodules Detected on CT Images: From the Fleischner Society 2017; Radiology 2017; 284:228-243. Additional ancillary findings as above. Electronically Signed   By: Corrie Mckusick D.O.   On: 10/23/2020 14:00   NM Sentinel Node Inj-No Rpt (Breast)  Result Date: 11/05/2020 Sulfur colloid was injected by the nuclear medicine technologist for melanoma sentinel node.   MM Breast Surgical Specimen  Result Date: 11/05/2020 CLINICAL DATA:  Left lumpectomy for breast cancer. EXAM: SPECIMEN RADIOGRAPH OF THE LEFT BREAST COMPARISON:  Previous exam(s). FINDINGS: Status post excision of the left breast. The radioactive seed and ribbon shaped biopsy marker clip are present, completely intact. IMPRESSION: Specimen radiograph of the left breast. Electronically Signed   By: Claudie Revering M.D.   On: 11/05/2020 12:58   ECHOCARDIOGRAM COMPLETE  Result Date: 11/15/2020    ECHOCARDIOGRAM REPORT   Patient Name:   Lindsey HUNT  Price Date of Exam: 11/15/2020 Medical Rec #:  [id number]           Height:       66.0 in Accession #:    [id number]          Weight:       126.1 lb Date of Birth:  1946/07/08           BSA:          1.644 m Patient Age:    27 years            BP:           95/53 mmHg Patient Gender: F                   HR:           91 bpm. Exam Location:  Inpatient Procedure: 2D Echo, Cardiac Doppler and Color Doppler STAT ECHO Indications:    Chemo evaluation  History:        Patient has no prior history of Echocardiogram examinations.                 Breast cancer, Lymphoma.  Sonographer:    Dustin Flock Referring Phys: [place]  1. Left ventricular ejection fraction, by estimation, is 55 to 60%. The left ventricle has normal function. The left ventricle has no regional wall  motion abnormalities. Left ventricular diastolic parameters are indeterminate. The average left ventricular global longitudinal strain is -18.9 %.  2. Right ventricular systolic function is normal. The right ventricular size is normal. There is normal pulmonary artery systolic pressure.  3. The mitral valve is normal in structure. Mild mitral valve regurgitation.  4. The aortic valve is normal in structure. Aortic valve regurgitation is not visualized. FINDINGS  Left Ventricle: Left ventricular ejection fraction, by estimation, is 55 to 60%. The left ventricle has normal function. The left ventricle has no regional wall motion abnormalities. The average left ventricular global longitudinal strain is -18.9 %. The left ventricular internal cavity size was normal in size. There is no left ventricular hypertrophy. Left ventricular diastolic parameters are indeterminate. Right Ventricle: The right ventricular size is normal. No increase in right ventricular wall thickness. Right ventricular systolic function is normal. There is normal pulmonary artery systolic pressure. The tricuspid regurgitant velocity is 2.30 m/s, and  with an  assumed right atrial pressure of 3 mmHg, the estimated right ventricular systolic pressure is 41.9 mmHg. Left Atrium: Left atrial size was normal in size. Right Atrium: Right atrial size was normal in size. Pericardium: There is no evidence of pericardial effusion. Mitral Valve: The mitral valve is normal in structure. Mild mitral valve regurgitation. Tricuspid Valve: The tricuspid valve is grossly normal. Tricuspid valve regurgitation is trivial. Aortic Valve: The aortic valve is normal in structure. Aortic valve regurgitation is not visualized. Pulmonic Valve: The pulmonic valve was not well visualized. Pulmonic valve regurgitation is not visualized. Aorta: The aortic root and ascending aorta are structurally normal, with no evidence of dilitation. IAS/Shunts: The atrial septum is grossly normal.  LEFT VENTRICLE PLAX 2D LVIDd:         4.60 cm  Diastology LVIDs:         3.20 cm  LV e' medial:    7.40 cm/s LV PW:         1.00 cm  LV E/e' medial:  9.8 LV IVS:        0.90 cm  LV e' lateral:   8.59 cm/s LVOT diam:     2.30 cm  LV E/e' lateral: 8.4 LV SV:         116 LV SV Index:   70       2D Longitudinal Strain LVOT Area:     4.15 cm 2D Strain GLS Avg:     -18.9 %  RIGHT VENTRICLE RV Basal diam:  3.20 cm RV S prime:     10.10 cm/s LEFT ATRIUM             Index       RIGHT ATRIUM           Index LA diam:        2.70 cm 1.64 cm/m  RA Area:     13.20 cm LA Vol (A2C):   46.1 ml 28.04 ml/m RA Volume:   29.40 ml  17.89 ml/m LA Vol (A4C):   41.3 ml 25.12 ml/m LA Biplane Vol: 43.6 ml 26.52 ml/m  AORTIC VALVE LVOT Vmax:   102.00 cm/s LVOT Vmean:  67.100 cm/s LVOT VTI:    0.278 m  AORTA Ao Root diam: 2.80 cm MITRAL VALVE               TRICUSPID VALVE MV Area (PHT): 4.15 cm    TR Peak grad:   21.2 mmHg MV Decel Time: 183 msec    TR Vmax:  230.00 cm/s MV E velocity: 72.40 cm/s MV A velocity: 61.70 cm/s  SHUNTS MV E/A ratio:  1.17        Systemic VTI:  0.28 m                            Systemic Diam: 2.30 cm Mertie Moores MD Electronically signed by Mertie Moores MD Signature Date/Time: 11/15/2020/1:43:19 PM    Final    MM LT RADIOACTIVE SEED LOC MAMMO GUIDE  Result Date: 11/03/2020 CLINICAL DATA:  75 year old female for radioactive seed localization of LEFT breast cancer prior to lumpectomy. EXAM: MAMMOGRAPHIC GUIDED RADIOACTIVE SEED LOCALIZATION OF THE LEFT BREAST COMPARISON:  Previous exam(s). FINDINGS: Patient presents for radioactive seed localization prior to LEFT lumpectomy. I met with the patient and we discussed the procedure of seed localization including benefits and alternatives. We discussed the high likelihood of a successful procedure. We discussed the risks of the procedure including infection, bleeding, tissue injury and further surgery. We discussed the low dose of radioactivity involved in the procedure. Informed, written consent was given. The usual time-out protocol was performed immediately prior to the procedure. Using mammographic guidance, sterile technique, 1% lidocaine and an I-125 radioactive seed, the RIBBON clip was localized using a LATERAL approach. The follow-up mammogram images confirm the seed in the expected location along the LATERAL INFERIOR aspect of the mass/RIBBON clip and were marked for Dr. Marlou Starks. Follow-up survey of the patient confirms presence of the radioactive seed. Order number of I-125 seed:  [id number]. Total activity:  2.595 millicuries.  Reference Date: 10/13/2020 The patient tolerated the procedure well and was released from the [facility]. She was given instructions regarding seed removal. IMPRESSION: Radioactive seed localization LEFT breast. No apparent complications. Electronically Signed   By: Margarette Canada M.D.   On: 11/03/2020 12:16   [place] EKG SITE RITE  Result Date: 11/15/2020 If Site Rite image not attached, placement could not be confirmed due to current cardiac rhythm.   ASSESSMENT AND PLAN: 1) large B-cell lymphoma -CT abdomen/pelvis with contrast  10/21/2020 - "Acute colitis/enteritis of the cecum/ascending colon and the associated terminal loops of ileum. As was recently described on CT imaging, the anatomy of distal ileum, ileocecal valve, cecum is atypical and correlation with patient's prior surgical record would be useful. The acute inflammatory changes contribute to at least a partial small bowel obstruction. Primary concern of this inflammatory cystic mass/bowel of the right lower quadrant is malignancy given that the posterior margin is inseparable from the psoas muscle, adnexa, and the right ureter. Correlation with CEA may be useful. Inflammatory/infectious changes are also a consideration." -Colonoscopy performed 11/04/2020- "An infiltrative and ulcerated partially obstructing large mass was found at 85 cm proximal to the anus. 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 -Plan is for adjuvant radiation therapy followed by adjuvant antiestrogen therapy  3)  iron deficiency anemia -Labs from 11/15/2020-ferritin 34, iron 24, percent saturation 8%, TIBC 308 -Labs from 11/16/2020-vitamin B12 level 241, folate 7.4  4) history of right breast cancer diagnosed in 2011 status post lumpectomy and 5 years of antiestrogen therapy  5) depression/anxiety  6) hyperlipidemia  7) hypertension  8) hypothyroidism  9) allergies  PLAN:  -Labs from 11/16/2020 reviewed and are normal except for WBC 11.1, RBC 3.55, hemoglobin 8.9, hematocrit 28.8, MCH 25.1, RDW 16.0, platelets 401,000, ANC 7.9, absolute monocytes  1.2, glucose 106, BUN 26, calcium 8.5, total protein 5.8, albumin 2.5, AST 14, LDH 72.  Labs adequate to proceed with day 2 of cycle #1 of chemotherapy. -Will monitor daily CBC with differential, CMET, LDH, uric acid. -The patient has a low normal ferritin level as well as a low iron and percent saturation consistent with iron  deficiency anemia.  She has not tolerated oral iron in the past.  May consider administering Feraheme at the completion of her chemotherapy versus administering outpatient. -Continue Compazine as needed for nausea and vomiting. -Continue MiraLAX daily and will change Senokot-S to 1 tablet twice a day. -Lovenox for DVT prophylaxis. -Continue home medications. -Continue PPI and as needed Maalox for reflux symptoms. -May use home ibuprofen 400 mg every 6 hours as needed for mild pain.   LOS: 1 day   Mikey Bussing, DNP, AGPCNP-BC, AOCNP 11/16/20   ADDENDUM  .Patient was Personally and independently interviewed, examined and relevant elements of the history of present illness were reviewed in details and an assessment and plan was created. All elements of the patient's history of present illness , assessment and plan were discussed in details with Mikey Bussing, DNP, AGPCNP-BC, AOCNP. The above documentation reflects our combined findings assessment and plan.  Patient with iron deficiency likely due to slow GI losses from her ascending colon/cecal tumor and possibly decreased absorption with PPI.  Will replace with IV Feraheme weekly x2 doses with first dose as inpatient.  Also noted to have B12 deficiency likely from poor ideal absorption.  Will start on B12 1000 mcg subcu monthly.  Patient counseled on continuing soft diet at this time.  Laxatives to prevent chemotherapy related constipation especially since she had some issues with symptoms of partial small bowel obstruction related to her ascending colon mass.  We will watch for any increased right lower quadrant abdominal pain.  It is unlikely but small possibility of perforation as the large circumferential lymphoma results and bowel has a chance to heal.  Would avoid excessive NSAIDs to reduce risk of bleeding/ulceration.  Sullivan Lone MD MS

## 2020-11-17 DIAGNOSIS — Z5111 Encounter for antineoplastic chemotherapy: Secondary | ICD-10-CM | POA: Diagnosis not present

## 2020-11-17 DIAGNOSIS — C8510 Unspecified B-cell lymphoma, unspecified site: Secondary | ICD-10-CM | POA: Diagnosis not present

## 2020-11-17 LAB — COMPREHENSIVE METABOLIC PANEL
ALT: 24 U/L (ref 0–44)
AST: 12 U/L — ABNORMAL LOW (ref 15–41)
Albumin: 2.4 g/dL — ABNORMAL LOW (ref 3.5–5.0)
Alkaline Phosphatase: 73 U/L (ref 38–126)
Anion gap: 4 — ABNORMAL LOW (ref 5–15)
BUN: 26 mg/dL — ABNORMAL HIGH (ref 8–23)
CO2: 26 mmol/L (ref 22–32)
Calcium: 8.6 mg/dL — ABNORMAL LOW (ref 8.9–10.3)
Chloride: 109 mmol/L (ref 98–111)
Creatinine, Ser: 0.66 mg/dL (ref 0.44–1.00)
GFR, Estimated: >60 mL/min (ref >60)
Glucose, Bld: 89 mg/dL (ref 70–99)
Potassium: 4.3 mmol/L (ref 3.5–5.1)
Sodium: 139 mmol/L (ref 135–145)
Total Bilirubin: 0.8 mg/dL (ref 0.3–1.2)
Total Protein: 5.2 g/dL — ABNORMAL LOW (ref 6.5–8.1)

## 2020-11-17 LAB — CBC WITH DIFFERENTIAL/PLATELET
Abs Immature Granulocytes: 0.08 10*3/uL — ABNORMAL HIGH (ref 0.00–0.07)
Basophils Absolute: 0 10*3/uL (ref 0.0–0.1)
Basophils Relative: 0 %
Eosinophils Absolute: 0 10*3/uL (ref 0.0–0.5)
Eosinophils Relative: 0 %
HCT: 26.7 % — ABNORMAL LOW (ref 36.0–46.0)
Hemoglobin: 8.3 g/dL — ABNORMAL LOW (ref 12.0–15.0)
Immature Granulocytes: 1 %
Lymphocytes Relative: 20 %
Lymphs Abs: 2 10*3/uL (ref 0.7–4.0)
MCH: 25.5 pg — ABNORMAL LOW (ref 26.0–34.0)
MCHC: 31.1 g/dL (ref 30.0–36.0)
MCV: 81.9 fL (ref 80.0–100.0)
Monocytes Absolute: 1.1 10*3/uL — ABNORMAL HIGH (ref 0.1–1.0)
Monocytes Relative: 12 %
Neutro Abs: 6.6 10*3/uL (ref 1.7–7.7)
Neutrophils Relative %: 67 %
Platelets: 330 10*3/uL (ref 150–400)
RBC: 3.26 MIL/uL — ABNORMAL LOW (ref 3.87–5.11)
RDW: 15.8 % — ABNORMAL HIGH (ref 11.5–15.5)
WBC: 9.8 10*3/uL (ref 4.0–10.5)
nRBC: 0 % (ref 0.0–0.2)

## 2020-11-17 LAB — URIC ACID: Uric Acid, Serum: 5.1 mg/dL (ref 2.5–7.1)

## 2020-11-17 LAB — LACTATE DEHYDROGENASE: LDH: 66 U/L — ABNORMAL LOW (ref 98–192)

## 2020-11-17 MED ORDER — LORAZEPAM 0.5 MG PO TABS
0.5000 mg | ORAL_TABLET | Freq: Every evening | ORAL | Status: DC | PRN
  Administered 2020-11-17: 0.5 mg via ORAL
  Filled 2020-11-17: qty 1

## 2020-11-17 MED ORDER — ENSURE ENLIVE PO LIQD
237.0000 mL | Freq: Two times a day (BID) | ORAL | Status: DC
  Administered 2020-11-17 – 2020-11-19 (×4): 237 mL via ORAL

## 2020-11-17 MED ORDER — VINCRISTINE SULFATE CHEMO INJECTION 1 MG/ML
Freq: Once | INTRAVENOUS | Status: AC
  Filled 2020-11-17: qty 8

## 2020-11-17 MED ORDER — SODIUM CHLORIDE 0.9 % IV SOLN
Freq: Once | INTRAVENOUS | Status: AC
  Administered 2020-11-17: 8 mg via INTRAVENOUS
  Filled 2020-11-17: qty 4

## 2020-11-17 NOTE — Progress Notes (Signed)
Chemotherapy dosages and calculations verified with Verne Carrow, RN.

## 2020-11-17 NOTE — Progress Notes (Signed)
HEMATOLOGY-ONCOLOGY PROGRESS NOTE  DOS .11/17/2020  SUBJECTIVE: The patient tolerated day 2 of cycle1 of her chemotherapy well overall.  She reports having slept well last night with the Ativan and not feeling as jittery with the prednisone today.  We discussed the Ativan nightly as needed and the patient notes she will ask for if she needs it.  Having some mild reflux.  Having bowel movements.  Notes improving p.o. intake.  No nausea vomiting or significant abdominal pain. Tolerating cycle 1 day 3 of chemotherapy without any prohibitive toxicities at this time. Husband and son at bedside.  Answered their questions. No fevers no chills no night sweats. No other acute new symptoms overnight.  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 Biopsy showed invasive and in situ ductal carcinoma, HER-2 equivocal by IHC (2+), negative by FISH (ratio 1.59), ER+ >95%, PR+ 85%, Ki67 10%.    Malignant neoplasm of left breast in female, estrogen receptor positive ([place])  10/12/2020 Initial Diagnosis   Malignant neoplasm of left breast in female, estrogen receptor positive ([place])   10/12/2020 Cancer Staging   Staging form: Breast, AJCC 8th Edition - Clinical stage from 10/12/2020: Stage IA (cT1b, cN0, cM0, G2, ER+, PR+, HER2-) - Signed by Nicholas Lose, MD on 10/15/2020 Stage prefix: Initial diagnosis   11/05/2020 Surgery   Left lumpectomy: Grade 1 IDC, 1.1 cm, intermediate grade DCIS, margins are negative, lymph node -0/1, ER greater than 95%, PR 85%, HER-2 negative, Ki-67 10%   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[place])  11/11/2020 Initial Diagnosis   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[place])   11/15/2020 -  Chemotherapy    Patient is on Treatment Plan: IP NON-HODGKINS LYMPHOMA EPOCH Q21D   Patient is on Antibody Plan: NON-HODGKINS LYMPHOMA RITUXIMAB Q21D    11/22/2020 -  Chemotherapy    Patient is on Treatment Plan: IP NON-HODGKINS LYMPHOMA EPOCH Q21D   Patient is on Antibody Plan: NON-HODGKINS LYMPHOMA RITUXIMAB Q21D       REVIEW OF SYSTEMS:   Constitutional: Denies fevers, chills  Eyes: Denies blurriness of vision Ears, nose, mouth, throat, and face: Denies mucositis or sore throat Respiratory: Denies cough, dyspnea or wheezes Cardiovascular: Denies palpitation, chest discomfort Gastrointestinal:  Denies nausea, heartburn or change in bowel habits Skin: Denies abnormal skin rashes Lymphatics: Denies new lymphadenopathy or easy bruising Neurological: Reports a mild headache this morning Behavioral/Psych: Mood is stable, no new changes  Extremities: No lower extremity edema All other systems were reviewed with the patient and are negative.  I have reviewed the past medical history, past surgical history, social history and family history with the patient and they are unchanged from previous note.   PHYSICAL EXAMINATION: ECOG PERFORMANCE STATUS: 1 - Symptomatic but completely ambulatory  Vitals:   11/16/20 2036 11/17/20 0556  BP: 119/64 117/67  Pulse: 66 60  Resp: 16   Temp: 97.6 F (36.4 C) (!) 97.5 F (36.4 C)  SpO2: 93% 100%   There were no vitals filed for this visit.  Intake/Output from previous day: 03/15 0701 - 03/16 0700 In: 1431.2 [  P.O.:480; IV Piggyback:951.2] Out: -  . GENERAL:alert, in no acute distress and comfortable SKIN: no acute rashes, no significant lesions EYES: conjunctiva are pink and non-injected, sclera anicteric OROPHARYNX: MMM, no exudates, no oropharyngeal erythema or ulceration NECK: supple, no JVD LYMPH:  no palpable lymphadenopathy in the  cervical, axillary or inguinal regions LUNGS: clear to auscultation b/l with normal respiratory effort HEART: regular rate & rhythm ABDOMEN:  normoactive bowel sounds , non tender, not distended. Extremity: no pedal edema PSYCH: alert & oriented x 3 with fluent speech NEURO: no focal motor/sensory deficits   LABORATORY DATA:  I have reviewed the data as listed  . CBC Latest Ref Rng & Units 11/17/2020 11/16/2020 11/15/2020  WBC 4.0 - 10.5 K/uL 9.8 11.1(H) 10.4  Hemoglobin 12.0 - 15.0 g/dL 8.3(L) 8.9(L) 9.6(L)  Hematocrit 36.0 - 46.0 % 26.7(L) 28.8(L) 31.5(L)  Platelets 150 - 400 K/uL 330 401(H) 422(H)    CMP Latest Ref Rng & Units 11/17/2020 11/16/2020 11/15/2020  Glucose 70 - 99 mg/dL 89 106(H) 104(H)  BUN 8 - 23 mg/dL 26(H) 26(H) 27(H)  Creatinine 0.44 - 1.00 mg/dL 0.66 0.83 0.75  Sodium 135 - 145 mmol/L 139 137 139  Potassium 3.5 - 5.1 mmol/L 4.3 4.2 3.9  Chloride 98 - 111 mmol/L 109 104 106  CO2 22 - 32 mmol/L $RemoveB'26 25 24  'MLbiOEEZ$ Calcium 8.9 - 10.3 mg/dL 8.6(L) 8.5(L) 8.9  Total Protein 6.5 - 8.1 g/dL 5.2(L) 5.8(L) 6.8  Total Bilirubin 0.3 - 1.2 mg/dL 0.8 0.6 0.4  Alkaline Phos 38 - 126 U/L 73 84 106  AST 15 - 41 U/L 12(L) 14(L) 21  ALT 0 - 44 U/L 24 31 47(H)    CT ABDOMEN PELVIS W CONTRAST  Result Date: 10/23/2020 CLINICAL DATA:  75 year old female with abdominal pain. The recent consultation notes from general surgery and gastroenterology make no mention that the patient acknowledges a prior small bowel or colon surgery. Last colonoscopy 10/22/2019 EXAM: CT ABDOMEN AND PELVIS WITH CONTRAST TECHNIQUE: Multidetector CT imaging of the abdomen and pelvis was performed using the standard protocol following bolus administration of intravenous contrast. CONTRAST:  121mL OMNIPAQUE IOHEXOL 300 MG/ML  SOLN COMPARISON:  10/02/2020, 10/01/2020 FINDINGS: Lower chest: No acute finding of the lower chest. Scarring/atelectasis in the middle lobe right lower lobe. Rounded suspicious 6 mm nodule at the base  of the right lung. Hepatobiliary: Unremarkable appearance of the liver. Unremarkable gallbladder. Pancreas: Unremarkable Spleen: Unremarkable Adrenals/Urinary Tract: Adrenal glands unremarkable. Right kidney without hydronephrosis or nephrolithiasis. The right ureter in the mid segment is inseparable from the inflammatory changes/serosal margin of the bowel/colon. No hydronephrosis or dilated ureter. Left kidney unremarkable with no hydronephrosis or nephrolithiasis. Subcentimeter low-density lesion in the left likely simple cyst. Unremarkable course of the left ureter. Unremarkable urinary bladder. Stomach/Bowel: - Stomach: Stomach unremarkable. - Small bowel: Proximal small bowel unremarkable. Distal small bowel is again distended. As was previously described, there is a C-shaped loop of small bowel within the pelvis with enteric contrast filling the loop and the ends contiguous with inflammatory changes of the right lower quadrant structure, whether small bowel or colon/cecum. As was previously noted, a normal appearing ileocecal valve cannot be identified. Enteric contrast enters proximal ascending colon. - Appendix: Normal appendix not visualized. - Colon: Similar appearance of a dilated tubular/cystic thick-walled structure in the region of the cecum, with atypical and unexpected anatomy. No ileocecal valve identified. No normal appendix. There are 3 separate ostia too small bowel loops, 2 of which are in  continuation with a C-shaped loop of small bowel, as above. The final has a separate ostium (image 56 of series 2. The posterior wall of this inflammatory structure is inseparable from the psoas musculature. The posterior wall of this inflammatory structure is inseparable from the right adnexa/ovary. The posterior wall of this inflammatory structure is inseparable from the mid segment of the right ureter. Vascular/Lymphatic: No aneurysm or dissection. Minimal aortic atherosclerosis. Bilateral iliac arteries  and proximal femoral arteries are patent. Unremarkable venous and portal venous structures. Mild atherosclerotic changes of the bilateral renal arteries. Reproductive: Hysterectomy. The right adnexa/ovary is inseparable from the inflammatory changes, described above in the GI section. Unremarkable left adnexa/broad ligament. Other: Small fat containing umbilical hernia. Musculoskeletal: Degenerative changes of the spine. IMPRESSION: Acute colitis/enteritis of the cecum/ascending colon and the associated terminal loops of ileum. As was recently described on CT imaging, the anatomy of distal ileum, ileocecal valve, cecum is atypical and correlation with patient's prior surgical record would be useful. The acute inflammatory changes contribute to at least a partial small bowel obstruction. Primary concern of this inflammatory cystic mass/bowel of the right lower quadrant is malignancy given that the posterior margin is inseparable from the psoas muscle, adnexa, and the right ureter. Correlation with CEA may be useful. Inflammatory/infectious changes are also a consideration. 6 mm nodule of the right lower lobe, suspicious, and could potentially represent metastasis. At least a non-contrast chest CT at 6-12 months is recommended. If the nodule is stable at time of repeat CT, then future CT at 18-24 months (from today's scan) is considered optional for low-risk patients, but is recommended for high-risk patients. This recommendation follows the consensus statement: Guidelines for Management of Incidental Pulmonary Nodules Detected on CT Images: From the Fleischner Society 2017; Radiology 2017; 284:228-243. Additional ancillary findings as above. Electronically Signed   By: Corrie Mckusick D.O.   On: 10/23/2020 14:00   NM Sentinel Node Inj-No Rpt (Breast)  Result Date: 11/05/2020 Sulfur colloid was injected by the nuclear medicine technologist for melanoma sentinel node.   MM Breast Surgical Specimen  Result Date:  11/05/2020 CLINICAL DATA:  Left lumpectomy for breast cancer. EXAM: SPECIMEN RADIOGRAPH OF THE LEFT BREAST COMPARISON:  Previous exam(s). FINDINGS: Status post excision of the left breast. The radioactive seed and ribbon shaped biopsy marker clip are present, completely intact. IMPRESSION: Specimen radiograph of the left breast. Electronically Signed   By: Claudie Revering M.D.   On: 11/05/2020 12:58   ECHOCARDIOGRAM COMPLETE  Result Date: 11/15/2020    ECHOCARDIOGRAM REPORT   Patient Name:   Sila HUNT Amenta Date of Exam: 11/15/2020 Medical Rec #:  [id number]           Height:       66.0 in Accession #:    [id number]          Weight:       126.1 lb Date of Birth:  07/26/1946           BSA:          1.644 m Patient Age:    75 years            BP:           95/53 mmHg Patient Gender: F                   HR:           91 bpm. Exam Location:  Inpatient Procedure: 2D Echo, Cardiac Doppler and Color Doppler  STAT ECHO Indications:    Chemo evaluation  History:        Patient has no prior history of Echocardiogram examinations.                 Breast cancer, Lymphoma.  Sonographer:    Dustin Flock Referring Phys: [place]  1. Left ventricular ejection fraction, by estimation, is 55 to 60%. The left ventricle has normal function. The left ventricle has no regional wall motion abnormalities. Left ventricular diastolic parameters are indeterminate. The average left ventricular global longitudinal strain is -18.9 %.  2. Right ventricular systolic function is normal. The right ventricular size is normal. There is normal pulmonary artery systolic pressure.  3. The mitral valve is normal in structure. Mild mitral valve regurgitation.  4. The aortic valve is normal in structure. Aortic valve regurgitation is not visualized. FINDINGS  Left Ventricle: Left ventricular ejection fraction, by estimation, is 55 to 60%. The left ventricle has normal function. The left ventricle has no regional wall motion  abnormalities. The average left ventricular global longitudinal strain is -18.9 %. The left ventricular internal cavity size was normal in size. There is no left ventricular hypertrophy. Left ventricular diastolic parameters are indeterminate. Right Ventricle: The right ventricular size is normal. No increase in right ventricular wall thickness. Right ventricular systolic function is normal. There is normal pulmonary artery systolic pressure. The tricuspid regurgitant velocity is 2.30 m/s, and  with an assumed right atrial pressure of 3 mmHg, the estimated right ventricular systolic pressure is 10.2 mmHg. Left Atrium: Left atrial size was normal in size. Right Atrium: Right atrial size was normal in size. Pericardium: There is no evidence of pericardial effusion. Mitral Valve: The mitral valve is normal in structure. Mild mitral valve regurgitation. Tricuspid Valve: The tricuspid valve is grossly normal. Tricuspid valve regurgitation is trivial. Aortic Valve: The aortic valve is normal in structure. Aortic valve regurgitation is not visualized. Pulmonic Valve: The pulmonic valve was not well visualized. Pulmonic valve regurgitation is not visualized. Aorta: The aortic root and ascending aorta are structurally normal, with no evidence of dilitation. IAS/Shunts: The atrial septum is grossly normal.  LEFT VENTRICLE PLAX 2D LVIDd:         4.60 cm  Diastology LVIDs:         3.20 cm  LV e' medial:    7.40 cm/s LV PW:         1.00 cm  LV E/e' medial:  9.8 LV IVS:        0.90 cm  LV e' lateral:   8.59 cm/s LVOT diam:     2.30 cm  LV E/e' lateral: 8.4 LV SV:         116 LV SV Index:   70       2D Longitudinal Strain LVOT Area:     4.15 cm 2D Strain GLS Avg:     -18.9 %  RIGHT VENTRICLE RV Basal diam:  3.20 cm RV S prime:     10.10 cm/s LEFT ATRIUM             Index       RIGHT ATRIUM           Index LA diam:        2.70 cm 1.64 cm/m  RA Area:     13.20 cm LA Vol (A2C):   46.1 ml 28.04 ml/m RA Volume:   29.40 ml  17.89  ml/m LA Vol (A4C):  41.3 ml 25.12 ml/m LA Biplane Vol: 43.6 ml 26.52 ml/m  AORTIC VALVE LVOT Vmax:   102.00 cm/s LVOT Vmean:  67.100 cm/s LVOT VTI:    0.278 m  AORTA Ao Root diam: 2.80 cm MITRAL VALVE               TRICUSPID VALVE MV Area (PHT): 4.15 cm    TR Peak grad:   21.2 mmHg MV Decel Time: 183 msec    TR Vmax:        230.00 cm/s MV E velocity: 72.40 cm/s MV A velocity: 61.70 cm/s  SHUNTS MV E/A ratio:  1.17        Systemic VTI:  0.28 m                            Systemic Diam: 2.30 cm Mertie Moores MD Electronically signed by Mertie Moores MD Signature Date/Time: 11/15/2020/1:43:19 PM    Final    MM LT RADIOACTIVE SEED LOC MAMMO GUIDE  Result Date: 11/03/2020 CLINICAL DATA:  75 year old female for radioactive seed localization of LEFT breast cancer prior to lumpectomy. EXAM: MAMMOGRAPHIC GUIDED RADIOACTIVE SEED LOCALIZATION OF THE LEFT BREAST COMPARISON:  Previous exam(s). FINDINGS: Patient presents for radioactive seed localization prior to LEFT lumpectomy. I met with the patient and we discussed the procedure of seed localization including benefits and alternatives. We discussed the high likelihood of a successful procedure. We discussed the risks of the procedure including infection, bleeding, tissue injury and further surgery. We discussed the low dose of radioactivity involved in the procedure. Informed, written consent was given. The usual time-out protocol was performed immediately prior to the procedure. Using mammographic guidance, sterile technique, 1% lidocaine and an I-125 radioactive seed, the RIBBON clip was localized using a LATERAL approach. The follow-up mammogram images confirm the seed in the expected location along the LATERAL INFERIOR aspect of the mass/RIBBON clip and were marked for Dr. Marlou Starks. Follow-up survey of the patient confirms presence of the radioactive seed. Order number of I-125 seed:  [id number]. Total activity:  7.564 millicuries.  Reference Date: 10/13/2020 The patient  tolerated the procedure well and was released from the [facility]. She was given instructions regarding seed removal. IMPRESSION: Radioactive seed localization LEFT breast. No apparent complications. Electronically Signed   By: Margarette Canada M.D.   On: 11/03/2020 12:16   [place] EKG SITE RITE  Result Date: 11/15/2020 If Site Rite image not attached, placement could not be confirmed due to current cardiac rhythm.   ASSESSMENT AND PLAN: 1) large B-cell lymphoma -CT abdomen/pelvis with contrast 10/21/2020 - "Acute colitis/enteritis of the cecum/ascending colon and the associated terminal loops of ileum. As was recently described on CT imaging, the anatomy of distal ileum, ileocecal valve, cecum is atypical and correlation with patient's prior surgical record would be useful. The acute inflammatory changes contribute to at least a partial small bowel obstruction. Primary concern of this inflammatory cystic mass/bowel of the right lower quadrant is malignancy given that the posterior margin is inseparable from the psoas muscle, adnexa, and the right ureter. Correlation with CEA may be useful. Inflammatory/infectious changes are also a consideration." -Colonoscopy performed 11/04/2020- "An infiltrative and ulcerated partially obstructing large mass was found at 85 cm proximal to the anus. 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 -moderate anemia will need to be monitored and PRBC transfusion prn fro hgb<7.5 or if symptomatic. -received IV Feraheme and B12 [place] x 1 -Protonix for GERD and maalox prn -ativan 0.$RemoveBefor'5mg'[id number]$  po HS prn for insomnia or anxiety. -Continue Compazine as needed for nausea and vomiting. -Continue MiraLAX daily and will change Senokot-S to 1 tablet twice a day. -soft diet to minimize risk of obstructive bowel symptoms. Lovenox for DVT prophylaxis. -Continue home medications. -will need to likely give Rituxan D5 prior to discharge if outpatient Rituxan not possible. Will need outpatient G-CSF at the cancer center on 11/22/2020. -will continue to f/u daily. -Anticipate discharge late Friday    LOS: 2 days   Vinia Jemmott,11/17/20

## 2020-11-18 ENCOUNTER — Telehealth: Payer: Self-pay | Admitting: Hematology

## 2020-11-18 LAB — CBC WITH DIFFERENTIAL/PLATELET
Abs Immature Granulocytes: 0.05 10*3/uL (ref 0.00–0.07)
Basophils Absolute: 0 10*3/uL (ref 0.0–0.1)
Basophils Relative: 0 %
Eosinophils Absolute: 0 10*3/uL (ref 0.0–0.5)
Eosinophils Relative: 0 %
HCT: 26.4 % — ABNORMAL LOW (ref 36.0–46.0)
Hemoglobin: 8.1 g/dL — ABNORMAL LOW (ref 12.0–15.0)
Immature Granulocytes: 1 %
Lymphocytes Relative: 21 %
Lymphs Abs: 1.8 10*3/uL (ref 0.7–4.0)
MCH: 24.9 pg — ABNORMAL LOW (ref 26.0–34.0)
MCHC: 30.7 g/dL (ref 30.0–36.0)
MCV: 81.2 fL (ref 80.0–100.0)
Monocytes Absolute: 0.8 10*3/uL (ref 0.1–1.0)
Monocytes Relative: 9 %
Neutro Abs: 6.2 10*3/uL (ref 1.7–7.7)
Neutrophils Relative %: 69 %
Platelets: 351 10*3/uL (ref 150–400)
RBC: 3.25 MIL/uL — ABNORMAL LOW (ref 3.87–5.11)
RDW: 15.8 % — ABNORMAL HIGH (ref 11.5–15.5)
WBC: 8.8 10*3/uL (ref 4.0–10.5)
nRBC: 0 % (ref 0.0–0.2)

## 2020-11-18 LAB — COMPREHENSIVE METABOLIC PANEL
ALT: 20 U/L (ref 0–44)
AST: 11 U/L — ABNORMAL LOW (ref 15–41)
Albumin: 2.4 g/dL — ABNORMAL LOW (ref 3.5–5.0)
Alkaline Phosphatase: 67 U/L (ref 38–126)
Anion gap: 8 (ref 5–15)
BUN: 26 mg/dL — ABNORMAL HIGH (ref 8–23)
CO2: 25 mmol/L (ref 22–32)
Calcium: 8.7 mg/dL — ABNORMAL LOW (ref 8.9–10.3)
Chloride: 110 mmol/L (ref 98–111)
Creatinine, Ser: 0.71 mg/dL (ref 0.44–1.00)
GFR, Estimated: >60 mL/min (ref >60)
Glucose, Bld: 82 mg/dL (ref 70–99)
Potassium: 3.7 mmol/L (ref 3.5–5.1)
Sodium: 143 mmol/L (ref 135–145)
Total Bilirubin: 0.6 mg/dL (ref 0.3–1.2)
Total Protein: 5.3 g/dL — ABNORMAL LOW (ref 6.5–8.1)

## 2020-11-18 LAB — URIC ACID: Uric Acid, Serum: 5.1 mg/dL (ref 2.5–7.1)

## 2020-11-18 LAB — LACTATE DEHYDROGENASE: LDH: 67 U/L — ABNORMAL LOW (ref 98–192)

## 2020-11-18 MED ORDER — SODIUM CHLORIDE 0.9 % IV SOLN
510.0000 mg | Freq: Once | INTRAVENOUS | Status: AC
  Administered 2020-11-19: 510 mg via INTRAVENOUS
  Filled 2020-11-18: qty 510

## 2020-11-18 MED ORDER — SODIUM CHLORIDE 0.9 % IV SOLN
750.0000 mg/m2 | Freq: Once | INTRAVENOUS | Status: AC
  Administered 2020-11-19: 1220 mg via INTRAVENOUS
  Filled 2020-11-18: qty 61

## 2020-11-18 MED ORDER — ONDANSETRON HCL 8 MG PO TABS: 8.0000 mg | ORAL_TABLET | Freq: Two times a day (BID) | ORAL | 1 refills | Status: DC

## 2020-11-18 MED ORDER — PROCHLORPERAZINE MALEATE 10 MG PO TABS: 10.0000 mg | ORAL_TABLET | Freq: Four times a day (QID) | ORAL | 1 refills | Status: DC | PRN

## 2020-11-18 MED ORDER — PROCHLORPERAZINE 25 MG RE SUPP: 25.0000 mg | Freq: Two times a day (BID) | RECTAL | 3 refills | Status: DC | PRN

## 2020-11-18 MED ORDER — ONDANSETRON HCL 40 MG/20ML IJ SOLN
Freq: Once | INTRAMUSCULAR | Status: AC
  Administered 2020-11-19: 36 mg via INTRAVENOUS
  Filled 2020-11-18: qty 8

## 2020-11-18 MED ORDER — LORAZEPAM 0.5 MG PO TABS: 0.5000 mg | ORAL_TABLET | Freq: Four times a day (QID) | ORAL | 0 refills | Status: DC | PRN

## 2020-11-18 MED ORDER — DEXAMETHASONE 4 MG PO TABS: 8.0000 mg | ORAL_TABLET | Freq: Two times a day (BID) | ORAL | 1 refills | Status: DC

## 2020-11-18 MED ORDER — CYANOCOBALAMIN 1000 MCG/ML IJ SOLN
1000.0000 ug | Freq: Once | INTRAMUSCULAR | Status: AC
  Administered 2020-11-19: 1000 ug via SUBCUTANEOUS
  Filled 2020-11-18: qty 1

## 2020-11-18 MED ORDER — VINCRISTINE SULFATE CHEMO INJECTION 1 MG/ML
Freq: Once | INTRAVENOUS | Status: AC
  Filled 2020-11-18: qty 8

## 2020-11-18 MED ORDER — SODIUM CHLORIDE 0.9 % IV SOLN
Freq: Once | INTRAVENOUS | Status: AC
  Administered 2020-11-18: 8 mg via INTRAVENOUS
  Filled 2020-11-18: qty 4

## 2020-11-18 NOTE — Progress Notes (Addendum)
HEMATOLOGY-ONCOLOGY PROGRESS NOTE  SUBJECTIVE: Do to tolerate chemotherapy well overall.  Reports that she slept better last night with use of lorazepam. She denies mucositis, nausea, vomiting.  No constipation reported this morning.  Reflux symptoms overall controlled with Protonix and as needed Maalox.    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 Biopsy showed invasive and in situ ductal carcinoma, HER-2 equivocal by IHC (2+), negative by FISH (ratio 1.59), ER+ >95%, PR+ 85%, Ki67 10%.    Malignant neoplasm of left breast in female, estrogen receptor positive ([place])  10/12/2020 Initial Diagnosis   Malignant neoplasm of left breast in female, estrogen receptor positive (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[place])   11/15/2020 -  Chemotherapy    Patient is on Treatment Plan: IP NON-HODGKINS LYMPHOMA EPOCH Q21D   Patient is on Antibody Plan: NON-HODGKINS LYMPHOMA RITUXIMAB Q21D    11/22/2020 -  Chemotherapy    Patient is on Treatment Plan: IP NON-HODGKINS LYMPHOMA EPOCH Q21D   Patient is on Antibody Plan:  NON-HODGKINS LYMPHOMA RITUXIMAB Q21D       REVIEW OF SYSTEMS:   Constitutional: Denies fevers, chills  Eyes: Denies blurriness of vision Ears, nose, mouth, throat, and face: Denies mucositis or sore throat Respiratory: Denies cough, dyspnea or wheezes Cardiovascular: Denies palpitation, chest discomfort Gastrointestinal:  Denies nausea and change in bowel habits.  Reports intermittent reflux symptoms. Skin: Denies abnormal skin rashes Lymphatics: Denies new lymphadenopathy or easy bruising Neurological: Reports a mild headache this morning Behavioral/Psych: Mood is stable, no new changes  Extremities: No lower extremity edema All other systems were reviewed with the patient and are negative.  I have reviewed the past medical history, past surgical history, social history and family history with the patient and they are unchanged from previous note.   PHYSICAL EXAMINATION: ECOG PERFORMANCE STATUS: 1 - Symptomatic but completely ambulatory  Vitals:   11/17/20 2006 11/18/20 0552  BP: 119/72 120/69  Pulse: (!) 55 (!) 56  Resp: 16 16  Temp: 98.6 F (37 C) 97.7 F (36.5 C)  SpO2: 98% 97%   There were no vitals filed for this visit.  Intake/Output from previous day: 03/16 0701 - 03/17 0700 In: 176 [IV Piggyback:176] Out: -   GENERAL:alert, no distress and comfortable SKIN: skin color, texture, turgor are normal, no rashes or significant lesions EYES: normal, Conjunctiva are pink and non-injected, sclera clear OROPHARYNX:no exudate, no erythema and lips, buccal mucosa, and tongue normal  LUNGS: clear to auscultation and percussion with normal breathing effort HEART: regular rate & rhythm and no murmurs and no lower extremity  edema ABDOMEN:abdomen soft, non-tender and normal bowel sounds Musculoskeletal:no cyanosis of digits and no clubbing  NEURO: alert & oriented x 3 with fluent speech, no focal motor/sensory deficits  LABORATORY DATA:  I have reviewed the data as  listed CMP Latest Ref Rng & Units 11/18/2020 11/17/2020 11/16/2020  Glucose 70 - 99 mg/dL 82 89 106(H)  BUN 8 - 23 mg/dL 26(H) 26(H) 26(H)  Creatinine 0.44 - 1.00 mg/dL 0.71 0.66 0.83  Sodium 135 - 145 mmol/L 143 139 137  Potassium 3.5 - 5.1 mmol/L 3.7 4.3 4.2  Chloride 98 - 111 mmol/L 110 109 104  CO2 22 - 32 mmol/L $RemoveB'25 26 25  'bpYoPcPh$ Calcium 8.9 - 10.3 mg/dL 8.7(L) 8.6(L) 8.5(L)  Total Protein 6.5 - 8.1 g/dL 5.3(L) 5.2(L) 5.8(L)  Total Bilirubin 0.3 - 1.2 mg/dL 0.6 0.8 0.6  Alkaline Phos 38 - 126 U/L 67 73 84  AST 15 - 41 U/L 11(L) 12(L) 14(L)  ALT 0 - 44 U/L $Remo'20 24 31    'eFnzJ$ Lab Results  Component Value Date   WBC 8.8 11/18/2020   HGB 8.1 (L) 11/18/2020   HCT 26.4 (L) 11/18/2020   MCV 81.2 11/18/2020   PLT 351 11/18/2020   NEUTROABS 6.2 11/18/2020    CT ABDOMEN PELVIS W CONTRAST  Result Date: 10/23/2020 CLINICAL DATA:  75 year old female with abdominal pain. The recent consultation notes from general surgery and gastroenterology make no mention that the patient acknowledges a prior small bowel or colon surgery. Last colonoscopy 10/22/2019 EXAM: CT ABDOMEN AND PELVIS WITH CONTRAST TECHNIQUE: Multidetector CT imaging of the abdomen and pelvis was performed using the standard protocol following bolus administration of intravenous contrast. CONTRAST:  141mL OMNIPAQUE IOHEXOL 300 MG/ML  SOLN COMPARISON:  10/02/2020, 10/01/2020 FINDINGS: Lower chest: No acute finding of the lower chest. Scarring/atelectasis in the middle lobe right lower lobe. Rounded suspicious 6 mm nodule at the base of the right lung. Hepatobiliary: Unremarkable appearance of the liver. Unremarkable gallbladder. Pancreas: Unremarkable Spleen: Unremarkable Adrenals/Urinary Tract: Adrenal glands unremarkable. Right kidney without hydronephrosis or nephrolithiasis. The right ureter in the mid segment is inseparable from the inflammatory changes/serosal margin of the bowel/colon. No hydronephrosis or dilated ureter. Left kidney unremarkable  with no hydronephrosis or nephrolithiasis. Subcentimeter low-density lesion in the left likely simple cyst. Unremarkable course of the left ureter. Unremarkable urinary bladder. Stomach/Bowel: - Stomach: Stomach unremarkable. - Small bowel: Proximal small bowel unremarkable. Distal small bowel is again distended. As was previously described, there is a C-shaped loop of small bowel within the pelvis with enteric contrast filling the loop and the ends contiguous with inflammatory changes of the right lower quadrant structure, whether small bowel or colon/cecum. As was previously noted, a normal appearing ileocecal valve cannot be identified. Enteric contrast enters proximal ascending colon. - Appendix: Normal appendix not visualized. - Colon: Similar appearance of a dilated tubular/cystic thick-walled structure in the region of the cecum, with atypical and unexpected anatomy. No ileocecal valve identified. No normal appendix. There are 3 separate ostia too small bowel loops, 2 of which are in continuation with a C-shaped loop of small bowel, as above. The final has a separate ostium (image 56 of series 2. The posterior wall of this inflammatory structure is inseparable from the psoas musculature. The posterior wall of this inflammatory structure is inseparable from the right adnexa/ovary. The posterior wall of this inflammatory structure is inseparable from the mid segment of the right ureter. Vascular/Lymphatic: No aneurysm or dissection. Minimal aortic atherosclerosis. Bilateral iliac arteries and proximal  femoral arteries are patent. Unremarkable venous and portal venous structures. Mild atherosclerotic changes of the bilateral renal arteries. Reproductive: Hysterectomy. The right adnexa/ovary is inseparable from the inflammatory changes, described above in the GI section. Unremarkable left adnexa/broad ligament. Other: Small fat containing umbilical hernia. Musculoskeletal: Degenerative changes of the spine.  IMPRESSION: Acute colitis/enteritis of the cecum/ascending colon and the associated terminal loops of ileum. As was recently described on CT imaging, the anatomy of distal ileum, ileocecal valve, cecum is atypical and correlation with patient's prior surgical record would be useful. The acute inflammatory changes contribute to at least a partial small bowel obstruction. Primary concern of this inflammatory cystic mass/bowel of the right lower quadrant is malignancy given that the posterior margin is inseparable from the psoas muscle, adnexa, and the right ureter. Correlation with CEA may be useful. Inflammatory/infectious changes are also a consideration. 6 mm nodule of the right lower lobe, suspicious, and could potentially represent metastasis. At least a non-contrast chest CT at 6-12 months is recommended. If the nodule is stable at time of repeat CT, then future CT at 18-24 months (from today's scan) is considered optional for low-risk patients, but is recommended for high-risk patients. This recommendation follows the consensus statement: Guidelines for Management of Incidental Pulmonary Nodules Detected on CT Images: From the Fleischner Society 2017; Radiology 2017; 284:228-243. Additional ancillary findings as above. Electronically Signed   By: Corrie Mckusick D.O.   On: 10/23/2020 14:00   NM Sentinel Node Inj-No Rpt (Breast)  Result Date: 11/05/2020 Sulfur colloid was injected by the nuclear medicine technologist for melanoma sentinel node.   MM Breast Surgical Specimen  Result Date: 11/05/2020 CLINICAL DATA:  Left lumpectomy for breast cancer. EXAM: SPECIMEN RADIOGRAPH OF THE LEFT BREAST COMPARISON:  Previous exam(s). FINDINGS: Status post excision of the left breast. The radioactive seed and ribbon shaped biopsy marker clip are present, completely intact. IMPRESSION: Specimen radiograph of the left breast. Electronically Signed   By: Claudie Revering M.D.   On: 11/05/2020 12:58   ECHOCARDIOGRAM  COMPLETE  Result Date: 11/15/2020    ECHOCARDIOGRAM REPORT   Patient Name:   Lindsey Price Date of Exam: 11/15/2020 Medical Rec #:  [id number]           Height:       66.0 in Accession #:    [id number]          Weight:       126.1 lb Date of Birth:  [birth year]           BSA:          1.644 m Patient Age:    37 years            BP:           95/53 mmHg Patient Gender: F                   HR:           91 bpm. Exam Location:  Inpatient Procedure: 2D Echo, Cardiac Doppler and Color Doppler STAT ECHO Indications:    Chemo evaluation  History:        Patient has no prior history of Echocardiogram examinations.                 Breast cancer, Lymphoma.  Sonographer:    Dustin Flock Referring Phys: [place]  1. Left ventricular ejection fraction, by estimation, is 55 to 60%. The left ventricle has normal function. The  left ventricle has no regional wall motion abnormalities. Left ventricular diastolic parameters are indeterminate. The average left ventricular global longitudinal strain is -18.9 %.  2. Right ventricular systolic function is normal. The right ventricular size is normal. There is normal pulmonary artery systolic pressure.  3. The mitral valve is normal in structure. Mild mitral valve regurgitation.  4. The aortic valve is normal in structure. Aortic valve regurgitation is not visualized. FINDINGS  Left Ventricle: Left ventricular ejection fraction, by estimation, is 55 to 60%. The left ventricle has normal function. The left ventricle has no regional wall motion abnormalities. The average left ventricular global longitudinal strain is -18.9 %. The left ventricular internal cavity size was normal in size. There is no left ventricular hypertrophy. Left ventricular diastolic parameters are indeterminate. Right Ventricle: The right ventricular size is normal. No increase in right ventricular wall thickness. Right ventricular systolic function is normal. There is normal pulmonary  artery systolic pressure. The tricuspid regurgitant velocity is 2.30 m/s, and  with an assumed right atrial pressure of 3 mmHg, the estimated right ventricular systolic pressure is 02.6 mmHg. Left Atrium: Left atrial size was normal in size. Right Atrium: Right atrial size was normal in size. Pericardium: There is no evidence of pericardial effusion. Mitral Valve: The mitral valve is normal in structure. Mild mitral valve regurgitation. Tricuspid Valve: The tricuspid valve is grossly normal. Tricuspid valve regurgitation is trivial. Aortic Valve: The aortic valve is normal in structure. Aortic valve regurgitation is not visualized. Pulmonic Valve: The pulmonic valve was not well visualized. Pulmonic valve regurgitation is not visualized. Aorta: The aortic root and ascending aorta are structurally normal, with no evidence of dilitation. IAS/Shunts: The atrial septum is grossly normal.  LEFT VENTRICLE PLAX 2D LVIDd:         4.60 cm  Diastology LVIDs:         3.20 cm  LV e' medial:    7.40 cm/s LV PW:         1.00 cm  LV E/e' medial:  9.8 LV IVS:        0.90 cm  LV e' lateral:   8.59 cm/s LVOT diam:     2.30 cm  LV E/e' lateral: 8.4 LV SV:         116 LV SV Index:   70       2D Longitudinal Strain LVOT Area:     4.15 cm 2D Strain GLS Avg:     -18.9 %  RIGHT VENTRICLE RV Basal diam:  3.20 cm RV S prime:     10.10 cm/s LEFT ATRIUM             Index       RIGHT ATRIUM           Index LA diam:        2.70 cm 1.64 cm/m  RA Area:     13.20 cm LA Vol (A2C):   46.1 ml 28.04 ml/m RA Volume:   29.40 ml  17.89 ml/m LA Vol (A4C):   41.3 ml 25.12 ml/m LA Biplane Vol: 43.6 ml 26.52 ml/m  AORTIC VALVE LVOT Vmax:   102.00 cm/s LVOT Vmean:  67.100 cm/s LVOT VTI:    0.278 m  AORTA Ao Root diam: 2.80 cm MITRAL VALVE               TRICUSPID VALVE MV Area (PHT): 4.15 cm    TR Peak grad:   21.2 mmHg MV Decel Time: 183 msec  TR Vmax:        230.00 cm/s MV E velocity: 72.40 cm/s MV A velocity: 61.70 cm/s  SHUNTS MV E/A ratio:  1.17         Systemic VTI:  0.28 m                            Systemic Diam: 2.30 cm Mertie Moores MD Electronically signed by Mertie Moores MD Signature Date/Time: 11/15/2020/1:43:19 PM    Final    MM LT RADIOACTIVE SEED LOC MAMMO GUIDE  Result Date: 11/03/2020 CLINICAL DATA:  75 year old female for radioactive seed localization of LEFT breast cancer prior to lumpectomy. EXAM: MAMMOGRAPHIC GUIDED RADIOACTIVE SEED LOCALIZATION OF THE LEFT BREAST COMPARISON:  Previous exam(s). FINDINGS: Patient presents for radioactive seed localization prior to LEFT lumpectomy. I met with the patient and we discussed the procedure of seed localization including benefits and alternatives. We discussed the high likelihood of a successful procedure. We discussed the risks of the procedure including infection, bleeding, tissue injury and further surgery. We discussed the low dose of radioactivity involved in the procedure. Informed, written consent was given. The usual time-out protocol was performed immediately prior to the procedure. Using mammographic guidance, sterile technique, 1% lidocaine and an I-125 radioactive seed, the RIBBON clip was localized using a LATERAL approach. The follow-up mammogram images confirm the seed in the expected location along the LATERAL INFERIOR aspect of the mass/RIBBON clip and were marked for Dr. Marlou Starks. Follow-up survey of the patient confirms presence of the radioactive seed. Order number of I-125 seed:  [id number]. Total activity:  9.326 millicuries.  Reference Date: 10/13/2020 The patient tolerated the procedure well and was released from the [facility]. She was given instructions regarding seed removal. IMPRESSION: Radioactive seed localization LEFT breast. No apparent complications. Electronically Signed   By: Margarette Canada M.D.   On: 11/03/2020 12:16   [place] EKG SITE RITE  Result Date: 11/15/2020 If Site Rite image not attached, placement could not be confirmed due to current cardiac  rhythm.   ASSESSMENT AND PLAN: 1) large B-cell lymphoma -CT abdomen/pelvis with contrast 10/21/2020 - "Acute colitis/enteritis of the cecum/ascending colon and the associated terminal loops of ileum. As was recently described on CT imaging, the anatomy of distal ileum, ileocecal valve, cecum is atypical and correlation with patient's prior surgical record would be useful. The acute inflammatory changes contribute to at least a partial small bowel obstruction. Primary concern of this inflammatory cystic mass/bowel of the right lower quadrant is malignancy given that the posterior margin is inseparable from the psoas muscle, adnexa, and the right ureter. Correlation with CEA may be useful. Inflammatory/infectious changes are also a consideration." -Colonoscopy performed 11/04/2020- "An infiltrative and ulcerated partially obstructing large mass was found at 85 cm proximal to the anus. 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 -Plan is for adjuvant radiation therapy followed by adjuvant antiestrogen therapy  3)  iron deficiency anemia -Labs from 11/15/2020-ferritin 34, iron 24, percent saturation 8%, TIBC 308 -Labs from 11/16/2020-vitamin B12 level 241, folate 7.4  4) history of right breast cancer diagnosed in 2011 status post lumpectomy and 5 years of antiestrogen therapy  5) depression/anxiety  6) hyperlipidemia  7) hypertension  8) hypothyroidism  9) allergies  PLAN:  1)large B-cell lymphoma -CT abdomen/pelvis with contrast 10/21/2020- "Acute colitis/enteritis of the cecum/ascending colon and the associated terminal  loops of ileum. As was recently described on CT imaging, the anatomy of distal ileum, ileocecal valve, cecum is atypical and correlation with patient's prior surgical record would be useful. The acute inflammatory changes contribute to at least a partial small bowel  obstruction. Primary concern of this inflammatory cystic mass/bowel of the right lower quadrant is malignancy given that the posterior margin is inseparable from the psoas muscle, adnexa, and the right ureter. Correlation with CEA may be useful. Inflammatory/infectious changes are also a consideration." -Colonoscopy performed 11/04/2020-"An infiltrative and ulcerated partially obstructing large mass was found at 85 cm proximal to the anus. 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 -moderate anemia will need to be monitored and PRBC transfusion prn fro hgb<7.5 or if symptomatic. -received IV Feraheme and B12 [address] x 1 -Protonix for GERD and maalox prn -ativan 0.$RemoveBefor'5mg'JebXgRmSslDr$  po HS prn for insomnia or anxiety. -Continue Compazine as needed for nausea and vomiting. -Continue MiraLAX daily and will change Senokot-S to 1 tablet twice a day. -soft diet to minimize risk of obstructive bowel symptoms. Lovenox for DVT prophylaxis. -Continue home medications. -will need to likely give Rituxan D5 prior to discharge if outpatient Rituxan not possible. Will need outpatient G-CSF at the cancer center on 11/22/2020. -will continue to f/u daily. -Anticipate discharge late Friday    LOS: 3 days   Mikey Bussing, DNP, AGPCNP-BC,  AOCNP 11/18/20   ADDENDUM  .Patient was Personally and independently interviewed, examined and relevant elements of the history of present illness were reviewed in details and an assessment and plan was created. All elements of the patient's history of present illness , assessment and plan were discussed in details with Mikey Bussing, DNP, AGPCNP-BC, AOCNP. The above documentation reflects our combined findings assessment and plan.  -will plan to give inpatient Rituxan tomorrow for C1... outpatient from C2 -G-CSF on 3/21 as outpatient -feraheme Dose 2 and [place] B12 tomorrow prior to discharge. -tolerating treatment well thus far without any prohibitive toxicities.  Sullivan Lone MD MS

## 2020-11-18 NOTE — Telephone Encounter (Signed)
Scheduled injection appointment per 3/16 secure chat. Patient is aware.

## 2020-11-18 NOTE — Care Management Important Message (Signed)
Important Message  Patient Details IM Letter given to the Patient. Name: Lindsey Price MRN: [id number] Date of Birth: [birth year]   Medicare Important Message Given:  Yes     Kerin Salen 11/18/2020, 10:25 AM

## 2020-11-19 ENCOUNTER — Other Ambulatory Visit: Payer: Self-pay | Admitting: Hematology

## 2020-11-19 ENCOUNTER — Telehealth: Payer: Self-pay | Admitting: Hematology

## 2020-11-19 DIAGNOSIS — C8510 Unspecified B-cell lymphoma, unspecified site: Secondary | ICD-10-CM

## 2020-11-19 LAB — COMPREHENSIVE METABOLIC PANEL
ALT: 18 U/L (ref 0–44)
AST: 12 U/L — ABNORMAL LOW (ref 15–41)
Albumin: 2.4 g/dL — ABNORMAL LOW (ref 3.5–5.0)
Alkaline Phosphatase: 62 U/L (ref 38–126)
Anion gap: 7 (ref 5–15)
BUN: 25 mg/dL — ABNORMAL HIGH (ref 8–23)
CO2: 26 mmol/L (ref 22–32)
Calcium: 8.4 mg/dL — ABNORMAL LOW (ref 8.9–10.3)
Chloride: 107 mmol/L (ref 98–111)
Creatinine, Ser: 0.67 mg/dL (ref 0.44–1.00)
GFR, Estimated: >60 mL/min (ref >60)
Glucose, Bld: 88 mg/dL (ref 70–99)
Potassium: 3.4 mmol/L — ABNORMAL LOW (ref 3.5–5.1)
Sodium: 140 mmol/L (ref 135–145)
Total Bilirubin: 0.8 mg/dL (ref 0.3–1.2)
Total Protein: 5.3 g/dL — ABNORMAL LOW (ref 6.5–8.1)

## 2020-11-19 LAB — CBC WITH DIFFERENTIAL/PLATELET
Abs Immature Granulocytes: 0.03 10*3/uL (ref 0.00–0.07)
Basophils Absolute: 0 10*3/uL (ref 0.0–0.1)
Basophils Relative: 0 %
Eosinophils Absolute: 0 10*3/uL (ref 0.0–0.5)
Eosinophils Relative: 0 %
HCT: 25.7 % — ABNORMAL LOW (ref 36.0–46.0)
Hemoglobin: 8 g/dL — ABNORMAL LOW (ref 12.0–15.0)
Immature Granulocytes: 0 %
Lymphocytes Relative: 26 %
Lymphs Abs: 1.9 10*3/uL (ref 0.7–4.0)
MCH: 25.2 pg — ABNORMAL LOW (ref 26.0–34.0)
MCHC: 31.1 g/dL (ref 30.0–36.0)
MCV: 81.1 fL (ref 80.0–100.0)
Monocytes Absolute: 0.2 10*3/uL (ref 0.1–1.0)
Monocytes Relative: 3 %
Neutro Abs: 5.3 10*3/uL (ref 1.7–7.7)
Neutrophils Relative %: 71 %
Platelets: 272 10*3/uL (ref 150–400)
RBC: 3.17 MIL/uL — ABNORMAL LOW (ref 3.87–5.11)
RDW: 15.8 % — ABNORMAL HIGH (ref 11.5–15.5)
WBC: 7.5 10*3/uL (ref 4.0–10.5)
nRBC: 0 % (ref 0.0–0.2)

## 2020-11-19 LAB — LACTATE DEHYDROGENASE: LDH: 75 U/L — ABNORMAL LOW (ref 98–192)

## 2020-11-19 LAB — URIC ACID: Uric Acid, Serum: 4.8 mg/dL (ref 2.5–7.1)

## 2020-11-19 MED ORDER — SODIUM CHLORIDE 0.9% FLUSH: 10.0000 mL | INTRAVENOUS | Status: DC | PRN

## 2020-11-19 MED ORDER — FAMOTIDINE IN NACL 20-0.9 MG/50ML-% IV SOLN: 20.0000 mg | Freq: Once | INTRAVENOUS | Status: DC | PRN

## 2020-11-19 MED ORDER — ALBUTEROL SULFATE (2.5 MG/3ML) 0.083% IN NEBU: 2.5000 mg | INHALATION_SOLUTION | Freq: Once | RESPIRATORY_TRACT | Status: DC | PRN

## 2020-11-19 MED ORDER — ALUM & MAG HYDROXIDE-SIMETH 200-200-20 MG/5ML PO SUSP: 30.0000 mL | ORAL | 0 refills | Status: DC | PRN

## 2020-11-19 MED ORDER — HEPARIN SOD (PORK) LOCK FLUSH 100 UNIT/ML IV SOLN: 250.0000 [IU] | Freq: Once | INTRAVENOUS | Status: DC | PRN

## 2020-11-19 MED ORDER — ACETAMINOPHEN 325 MG PO TABS
650.0000 mg | ORAL_TABLET | Freq: Once | ORAL | Status: AC
  Administered 2020-11-19: 650 mg via ORAL
  Filled 2020-11-19: qty 2

## 2020-11-19 MED ORDER — HEPARIN SOD (PORK) LOCK FLUSH 100 UNIT/ML IV SOLN: 500.0000 [IU] | Freq: Once | INTRAVENOUS | Status: DC | PRN

## 2020-11-19 MED ORDER — ENSURE ENLIVE PO LIQD: 237.0000 mL | Freq: Two times a day (BID) | ORAL | 12 refills | Status: DC

## 2020-11-19 MED ORDER — DIPHENHYDRAMINE HCL 50 MG PO CAPS
50.0000 mg | ORAL_CAPSULE | Freq: Once | ORAL | Status: AC
  Administered 2020-11-19: 50 mg via ORAL
  Filled 2020-11-19: qty 1

## 2020-11-19 MED ORDER — ALTEPLASE 2 MG IJ SOLR
2.0000 mg | Freq: Once | INTRAMUSCULAR | Status: DC | PRN
  Filled 2020-11-19: qty 2

## 2020-11-19 MED ORDER — SODIUM CHLORIDE 0.9% FLUSH: 3.0000 mL | INTRAVENOUS | Status: DC | PRN

## 2020-11-19 MED ORDER — DIPHENHYDRAMINE HCL 50 MG/ML IJ SOLN: 50.0000 mg | Freq: Once | INTRAMUSCULAR | Status: DC | PRN

## 2020-11-19 MED ORDER — METHYLPREDNISOLONE SODIUM SUCC 125 MG IJ SOLR
60.0000 mg | Freq: Every day | INTRAMUSCULAR | Status: DC
  Administered 2020-11-19: 60 mg via INTRAVENOUS
  Filled 2020-11-19: qty 2

## 2020-11-19 MED ORDER — EPINEPHRINE 0.3 MG/0.3ML IJ SOAJ
0.3000 mg | Freq: Once | INTRAMUSCULAR | Status: DC | PRN
  Filled 2020-11-19: qty 0.6

## 2020-11-19 MED ORDER — SODIUM CHLORIDE 0.9 % IV SOLN
375.0000 mg/m2 | Freq: Once | INTRAVENOUS | Status: AC
  Administered 2020-11-19: 600 mg via INTRAVENOUS
  Filled 2020-11-19: qty 10

## 2020-11-19 MED ORDER — POTASSIUM CHLORIDE CRYS ER 20 MEQ PO TBCR
20.0000 meq | EXTENDED_RELEASE_TABLET | Freq: Once | ORAL | Status: AC
  Administered 2020-11-19: 20 meq via ORAL
  Filled 2020-11-19: qty 1

## 2020-11-19 MED ORDER — SODIUM CHLORIDE 0.9 % IV SOLN: Freq: Once | INTRAVENOUS | Status: DC | PRN

## 2020-11-19 MED ORDER — CYANOCOBALAMIN 1000 MCG/ML IJ SOLN: 1000.0000 ug | INTRAMUSCULAR | 0 refills | Status: DC

## 2020-11-19 MED ORDER — METHYLPREDNISOLONE SODIUM SUCC 125 MG IJ SOLR: 125.0000 mg | Freq: Once | INTRAMUSCULAR | Status: DC | PRN

## 2020-11-19 MED ORDER — SODIUM BICARBONATE/SODIUM CHLORIDE MOUTHWASH: 1.0000 "application " | OROMUCOSAL | Status: DC | PRN

## 2020-11-19 MED ORDER — FAMOTIDINE IN NACL 20-0.9 MG/50ML-% IV SOLN
20.0000 mg | Freq: Once | INTRAVENOUS | Status: AC
  Administered 2020-11-19: 20 mg via INTRAVENOUS
  Filled 2020-11-19: qty 50

## 2020-11-19 MED ORDER — POLYETHYLENE GLYCOL 3350 17 G PO PACK: 17.0000 g | PACK | Freq: Every day | ORAL | 0 refills | Status: DC

## 2020-11-19 MED ORDER — SENNOSIDES-DOCUSATE SODIUM 8.6-50 MG PO TABS: 1.0000 | ORAL_TABLET | Freq: Two times a day (BID) | ORAL | Status: DC

## 2020-11-19 NOTE — Progress Notes (Signed)
Pt discharged home with spouse in stable condition. Discharge instructions given. Scripts sent to pharmacy of choice. No immediate questions or concerns at this time. Pt discharged from unit via wheelchair.  

## 2020-11-19 NOTE — Discharge Summary (Addendum)
Discharge Summary  Patient ID: Lindsey Price MRN: [id number] DOB/AGE: 1946-08-26 75 y.o.  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[place])   Encounter for antineoplastic chemotherapy   B12 deficiency   Iron deficiency anemia due to chronic blood loss  Discharged Condition: good  Discharge Labs:   CBC    Component Value Date/Time   WBC 7.5 11/19/2020 0543   RBC 3.17 (L) 11/19/2020 0543   HGB 8.0 (L) 11/19/2020 0543   HGB 9.4 (L) 11/12/2020 0951   HGB 13.7 02/06/2019 0908   HGB 14.6 05/15/2014 0908   HCT 25.7 (L) 11/19/2020 0543   HCT 41.3 02/06/2019 0908   HCT 44.9 05/15/2014 0908   PLT 272 11/19/2020 0543   PLT 388 11/12/2020 0951   PLT 322 02/06/2019 0908   MCV 81.1 11/19/2020 0543   MCV 91 02/06/2019 0908   MCV 95.6 05/15/2014 0908   MCH 25.2 (L) 11/19/2020 0543   MCHC 31.1 11/19/2020 0543   RDW 15.8 (H) 11/19/2020 0543   RDW 13.1 02/06/2019 0908   RDW 12.7 05/15/2014 0908   LYMPHSABS 1.9 11/19/2020 0543   LYMPHSABS 1.3 02/06/2019 0908   LYMPHSABS 1.5 05/15/2014 0908   MONOABS 0.2 11/19/2020 0543   MONOABS 0.5 05/15/2014 0908   EOSABS 0.0 11/19/2020 0543   EOSABS 0.2 02/06/2019 0908   EOSABS 0.1 05/27/2010 1030   BASOSABS 0.0 11/19/2020 0543   BASOSABS 0.0 02/06/2019 0908   BASOSABS 0.0 05/15/2014 0908   CMP Latest Ref Rng & Units 11/19/2020 11/18/2020 11/17/2020  Glucose 70 - 99 mg/dL 88 82 89  BUN 8 - 23 mg/dL 25(H) 26(H) 26(H)  Creatinine 0.44 - 1.00 mg/dL 0.67 0.71 0.66  Sodium 135 - 145 mmol/L 140 143 139  Potassium 3.5 - 5.1 mmol/L 3.4(L) 3.7 4.3  Chloride 98 - 111 mmol/L 107 110 109  CO2 22 - 32 mmol/L _0 Calcium 8.9 - 10.3 mg/dL 8.4(L) 8.7(L) 8.6(L)  Total Protein 6.5 - 8.1 g/dL 5.3(L) 5.3(L) 5.2(L)  Total Bilirubin 0.3 - 1.2 mg/dL 0.8 0.6 0.8  Alkaline Phos 38 - 126 U/L 62 67 73  AST 15 - 41 U/L 12(L) 11(L) 12(L)  ALT 0 - 44 U/L _1 Significant  Diagnostic Studies: 11/15/2020 echocardiogram showed LVEF of 55 to 60%.  Consults: None  Procedures: 11/15/2020 PICC line placement  Disposition:  Discharge disposition: 01-Home or Self Care        Allergies as of 11/19/2020      Reactions   Advair Diskus [fluticasone-salmeterol] Other (See Comments)   Hoarseness.   Neosporin [neomycin-polymyxin-gramicidin] Itching      Medication List    STOP taking these medications   miconazole 2 % cream Commonly known as: MICOTIN   predniSONE 20 MG tablet Commonly known as: DELTASONE     TAKE these medications   albuterol 108 (90 Base) MCG/ACT inhaler Commonly known as: ProAir HFA Inhale 1-2 puffs into the lungs every 6 (six) hours as needed for wheezing or shortness of breath.   alum & mag hydroxide-simeth 200-200-20 MG/5ML suspension Commonly known as: MAALOX/MYLANTA Take 30 mLs by mouth every 4 (four) hours as needed for indigestion.   atorvastatin 10 MG tablet Commonly known as: LIPITOR Take 1 tablet (10 mg total) by mouth at bedtime.   calcium carbonate 1250 (500 Ca) MG tablet Commonly known as: OS-CAL - dosed in mg of elemental calcium Take 1,250 mg by  mouth daily.   cyanocobalamin 1000 MCG/ML injection Commonly known as: (VITAMIN B-12) Inject 1 mL (1,000 mcg total) into the skin every 30 (thirty) days. Start taking on: December 16, 2020   dexamethasone 4 MG tablet Commonly known as: DECADRON Take 2 tablets (8 mg total) by mouth 2 (two) times daily with a meal. Take two times a day starting the day after chemotherapy for 3 days.   esomeprazole 20 MG capsule Commonly known as: NEXIUM Take 20 mg by mouth daily at 12 noon.   ezetimibe 10 MG tablet Commonly known as: Zetia Take 1 tablet (10 mg total) by mouth daily.   feeding supplement Liqd Take 237 mLs by mouth 2 (two) times daily between meals.   fluticasone 50 MCG/ACT nasal spray Commonly known as: FLONASE PLACE 1 SPRAY INTO BOTH NOSTRILS DAILY.    HYDROcodone-acetaminophen 5-325 MG tablet Commonly known as: NORCO/VICODIN Take 1-2 tablets by mouth every 6 (six) hours as needed for moderate pain or severe pain.   ibuprofen 600 MG tablet Commonly known as: ADVIL Take 1 tablet (600 mg total) by mouth every 8 (eight) hours as needed.   levocetirizine 5 MG tablet Commonly known as: XYZAL Take 5 mg by mouth daily.   levothyroxine 88 MCG tablet Commonly known as: SYNTHROID * What changed: Another medication with the same name was changed. Make sure you understand how and when to take each.   levothyroxine 100 MCG tablet Commonly known as: SYNTHROID TAKE 1 TABLET EVERY OTHER DAY ALTERNATING WITH 88 MCG TABLET What changed:   how much to take  how to take this  when to take this  additional instructions   LORazepam 0.5 MG tablet Commonly known as: Ativan Take 1 tablet (0.5 mg total) by mouth every 6 (six) hours as needed (Nausea or vomiting).   montelukast 10 MG tablet Commonly known as: SINGULAIR * What changed:   how much to take  how to take this  when to take this  additional instructions   multivitamin with minerals Tabs tablet Take 1 tablet by mouth daily.   omega-3 acid ethyl esters 1 g capsule Commonly known as: LOVAZA TAKE 2 CAPSULES (2 G TOTAL) BY MOUTH 2 (TWO) TIMES DAILY.   ondansetron 8 MG tablet Commonly known as: Zofran Take 1 tablet (8 mg total) by mouth 2 (two) times daily. Take two times a day starting the day after chemo for 3 days. Then take two times a day as needed for nausea or vomiting.   polyethylene glycol 17 g packet Commonly known as: MIRALAX / GLYCOLAX Take 17 g by mouth daily.   prochlorperazine 10 MG tablet Commonly known as: COMPAZINE Take 1 tablet (10 mg total) by mouth every 6 (six) hours as needed (Nausea or vomiting).   prochlorperazine 25 MG suppository Commonly known as: COMPAZINE Place 1 suppository (25 mg total) rectally every 12 (twelve) hours as needed for  nausea.   senna-docusate 8.6-50 MG tablet Commonly known as: Senokot-S Take 1 tablet by mouth 2 (two) times daily.   sodium bicarbonate/sodium chloride Soln 1 application by Mouth Rinse route as needed for dry mouth.   traMADol 50 MG tablet Commonly known as: ULTRAM Take 1 tablet (50 mg total) by mouth every 6 (six) hours as needed (mild pain).   venlafaxine XR 75 MG 24 hr capsule Commonly known as: EFFEXOR-XR TAKE 1 CAPSULE BY MOUTH EVERY DAY AT BEDTIME What changed: See the new instructions.   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  The patient has been being followed by Dr. Lindi Adie in our office for her breast cancer.  The patient had a CT of the abdomen/pelvis with contrast due to abdominal pain performed on 10/21/2020 which showed acute colitis/enteritis of the cecum/ascending colon and the associated terminal loops of ileum, acute inflammatory changes contribute to at least a partial small bowel obstruction, primary concern of this inflammatory cystic mass/bowel of the right lower quadrant is malignancy given that the posterior margin is inseparable from the psoas muscle, adnexa, and right ureter.  A colonoscopy was performed on 11/04/2020 which showed an infiltrative and ulcerated partially obstructing large mass was  found at 85 cm proximal to the anus. The mass was circumferential.  This mass was biopsied and was consistent with large B-cell lymphoma.  Due to this new finding, the patient was referred to Dr. Irene Limbo for consideration of systemic chemotherapy for treatment of her B-cell lymphoma.  Hospital Course: The patient was admitted on 11/15/2020 to begin cycle #1 of EPOCH-R.  On admission, she reported that she was having increased appetite and irritability secondary to prednisone.  She was also gaining weight due to steroids.  She was not having any fevers, chills, headaches, dizziness, chest pain, shortness of breath, abdominal pain, nausea, vomiting, constipation.  On the day of admission, she had a baseline echocardiogram performed which showed an LVEF of 55 to 60%.  She also had a PICC line placed for chemotherapy administration.  Her chemotherapy started as planned on 11/15/2020.  Overall, she tolerated her chemotherapy well and reported a mild headache on day 2 which resolved with as needed analgesics.  She also reported intermittent reflux symptoms which were well controlled with daily Protonix and as needed Maalox.  She will receive rituximab as an inpatient with cycle #1.  Starting September 2, rituximab will be administered as an outpatient.  The patient was found to have iron deficiency anemia and vitamin B12 deficiency.  She was given a dose of Feraheme 510 mg IV on 11/16/2020 and a repeat dose will be given prior to discharge on 11/19/2020.  Additionally, she was started on vitamin B12 1000 mcg subcu and received her first dose 11/16/2020 with a repeat dose to be given prior to discharge on 11/19/2020.  Thereafter, she will receive vitamin B12 subcu monthly in our office.  On the day of discharge, she reported that she was feeling well.  Is tolerating a soft diet.  She does not report any nausea, vomiting, constipation.  Plan is for discharge once Cytoxan, rituximab, Feraheme are completed.  She had mild  hypokalemia on the day of discharge and was given a dose of K-Dur 20 mEq x 1.  She was advised to follow-up with cancer center 11/22/2020 for a Neulasta injection.  She will have a formal chemo education class as well as a lab and follow-up visit on 11/26/2020.  She is tentatively scheduled for a possible PRBC transfusion depending on her lab work on 11/27/2020.  Discharge Instructions    Activity as tolerated - No restrictions   Complete by: As directed    No wound care   Complete by: As directed      Signed: Mikey Bussing 11/19/2020, 11:07 AM     ADDENDUM  Patient was personally and independently interviewed, examined and relevant elements of the discharge plan were created. All elements of the patient's discharge plan were discussed in details with Mikey Bussing DNP. The above documentation reflects our combined findings assessment and plan.  Overall patient tolerated cycle 1 of EPOCH-R without any acute toxicities. Received 2 doses of IV Feraheme for iron deficiency. Received 2 doses of subcu B12 for vitamin B12 deficiency likely from her ileal lesion. -She has been scheduled as outpatient for her G-CSF shot on 11/22/2020 -She she will be getting additional chemotherapy education labs and see me back on 11/26/2020 -She has been tentatively scheduled for 2 units of PRBCs if needed on 11/27/2020. -We have ordered a PET CT scan and Port-A-Cath to be done as outpatient in the next 1 to 2 weeks. -Awaiting high risk lymphoma FISH panel results -Patient advised to eat soft foods discussed concerning symptoms to watch out for. -Discussed infection prevention strategies.  Sullivan Lone MD MS Total time spent discharging patient more than 30 minutes

## 2020-11-19 NOTE — Progress Notes (Signed)
PET CT ordered.

## 2020-11-19 NOTE — Telephone Encounter (Signed)
Scheduled per 03/16 scheduled message, patient has been called and notified of all upcoming appointments.

## 2020-11-20 ENCOUNTER — Other Ambulatory Visit: Payer: Self-pay | Admitting: Hematology

## 2020-11-20 DIAGNOSIS — C8510 Unspecified B-cell lymphoma, unspecified site: Secondary | ICD-10-CM

## 2020-11-20 MED ORDER — LORAZEPAM 0.5 MG PO TABS: 0.5000 mg | ORAL_TABLET | Freq: Four times a day (QID) | ORAL | 0 refills | Status: DC | PRN

## 2020-11-22 ENCOUNTER — Other Ambulatory Visit: Payer: Self-pay | Admitting: Hematology

## 2020-11-22 ENCOUNTER — Inpatient Hospital Stay: Payer: PPO

## 2020-11-22 ENCOUNTER — Other Ambulatory Visit: Payer: Self-pay

## 2020-11-22 VITALS — BP 136/67 | HR 75 | Temp 98.6°F | Resp 16

## 2020-11-22 DIAGNOSIS — Z170 Estrogen receptor positive status [ER+]: Secondary | ICD-10-CM | POA: Diagnosis not present

## 2020-11-22 DIAGNOSIS — D509 Iron deficiency anemia, unspecified: Secondary | ICD-10-CM | POA: Diagnosis not present

## 2020-11-22 DIAGNOSIS — C8519 Unspecified B-cell lymphoma, extranodal and solid organ sites: Secondary | ICD-10-CM | POA: Diagnosis not present

## 2020-11-22 DIAGNOSIS — Z86000 Personal history of in-situ neoplasm of breast: Secondary | ICD-10-CM | POA: Diagnosis not present

## 2020-11-22 DIAGNOSIS — C8510 Unspecified B-cell lymphoma, unspecified site: Secondary | ICD-10-CM

## 2020-11-22 DIAGNOSIS — Z5189 Encounter for other specified aftercare: Secondary | ICD-10-CM | POA: Diagnosis not present

## 2020-11-22 DIAGNOSIS — C50412 Malignant neoplasm of upper-outer quadrant of left female breast: Secondary | ICD-10-CM | POA: Diagnosis not present

## 2020-11-22 MED ORDER — PEGFILGRASTIM-CBQV 6 MG/0.6ML ~~LOC~~ SOSY
PREFILLED_SYRINGE | SUBCUTANEOUS | Status: AC
  Filled 2020-11-22: qty 0.6

## 2020-11-22 MED ORDER — PEGFILGRASTIM-CBQV 6 MG/0.6ML ~~LOC~~ SOSY
6.0000 mg | PREFILLED_SYRINGE | Freq: Once | SUBCUTANEOUS | Status: AC
  Administered 2020-11-22: 6 mg via SUBCUTANEOUS

## 2020-11-22 NOTE — Patient Instructions (Signed)
Pegfilgrastim injection What is this medicine? PEGFILGRASTIM (PEG fil gra stim) is a long-acting granulocyte colony-stimulating factor that stimulates the growth of neutrophils, a type of white blood cell important in the body's fight against infection. It is used to reduce the incidence of fever and infection in patients with certain types of cancer who are receiving chemotherapy that affects the bone marrow, and to increase survival after being exposed to high doses of radiation. This medicine may be used for other purposes; ask your health care provider or pharmacist if you have questions. COMMON BRAND NAME(S): Fulphila, Neulasta, Nyvepria, UDENYCA, Ziextenzo What should I tell my health care provider before I take this medicine? They need to know if you have any of these conditions:  kidney disease  latex allergy  ongoing radiation therapy  sickle cell disease  skin reactions to acrylic adhesives (On-Body Injector only)  an unusual or allergic reaction to pegfilgrastim, filgrastim, other medicines, foods, dyes, or preservatives  pregnant or trying to get pregnant  breast-feeding How should I use this medicine? This medicine is for injection under the skin. If you get this medicine at home, you will be taught how to prepare and give the pre-filled syringe or how to use the On-body Injector. Refer to the patient Instructions for Use for detailed instructions. Use exactly as directed. Tell your healthcare provider immediately if you suspect that the On-body Injector may not have performed as intended or if you suspect the use of the On-body Injector resulted in a missed or partial dose. It is important that you put your used needles and syringes in a special sharps container. Do not put them in a trash can. If you do not have a sharps container, call your pharmacist or healthcare provider to get one. Talk to your pediatrician regarding the use of this medicine in children. While this drug  may be prescribed for selected conditions, precautions do apply. Overdosage: If you think you have taken too much of this medicine contact a poison control center or emergency room at once. NOTE: This medicine is only for you. Do not share this medicine with others. What if I miss a dose? It is important not to miss your dose. Call your doctor or health care professional if you miss your dose. If you miss a dose due to an On-body Injector failure or leakage, a new dose should be administered as soon as possible using a single prefilled syringe for manual use. What may interact with this medicine? Interactions have not been studied. This list may not describe all possible interactions. Give your health care provider a list of all the medicines, herbs, non-prescription drugs, or dietary supplements you use. Also tell them if you smoke, drink alcohol, or use illegal drugs. Some items may interact with your medicine. What should I watch for while using this medicine? Your condition will be monitored carefully while you are receiving this medicine. You may need blood work done while you are taking this medicine. Talk to your health care provider about your risk of cancer. You may be more at risk for certain types of cancer if you take this medicine. If you are going to need a MRI, CT scan, or other procedure, tell your doctor that you are using this medicine (On-Body Injector only). What side effects may I notice from receiving this medicine? Side effects that you should report to your doctor or health care professional as soon as possible:  allergic reactions (skin rash, itching or hives, swelling of   the face, lips, or tongue)  back pain  dizziness  fever  pain, redness, or irritation at site where injected  pinpoint red spots on the skin  red or dark-brown urine  shortness of breath or breathing problems  stomach or side pain, or pain at the shoulder  swelling  tiredness  trouble  passing urine or change in the amount of urine  unusual bruising or bleeding Side effects that usually do not require medical attention (report to your doctor or health care professional if they continue or are bothersome):  bone pain  muscle pain This list may not describe all possible side effects. Call your doctor for medical advice about side effects. You may report side effects to FDA at 1-800-FDA-1088. Where should I keep my medicine? Keep out of the reach of children. If you are using this medicine at home, you will be instructed on how to store it. Throw away any unused medicine after the expiration date on the label. NOTE: This sheet is a summary. It may not cover all possible information. If you have questions about this medicine, talk to your doctor, pharmacist, or health care provider.  2021 [employer]/Gold Standard (2019-09-12 13:20:51)  

## 2020-11-23 ENCOUNTER — Other Ambulatory Visit: Payer: Self-pay | Admitting: Gastroenterology

## 2020-11-24 ENCOUNTER — Other Ambulatory Visit: Payer: Self-pay | Admitting: *Deleted

## 2020-11-24 DIAGNOSIS — C8510 Unspecified B-cell lymphoma, unspecified site: Secondary | ICD-10-CM

## 2020-11-25 NOTE — Progress Notes (Incomplete)
HEMATOLOGY/ONCOLOGY CONSULTATION NOTE  Date of Service: 11/25/2020  Patient Care Team: Leonides Sake, MD as PCP - General (Family Medicine) Juanito Doom, MD as Consulting Physician (Pulmonary Disease) Elsie Saas, MD as Consulting Physician (Orthopedic Surgery) Allyn Kenner, MD as Consulting Physician (Dermatology) Katy Apo, MD as Consulting Physician (Ophthalmology) Jovita Kussmaul, MD as Consulting Physician (General Surgery) Nicholas Lose, MD as Consulting Physician (Hematology and Oncology) Garlan Fair, MD as Consulting Physician (Gastroenterology) Delice Bison, Charlestine Massed, NP as Nurse Practitioner (Hematology and Oncology)  CHIEF COMPLAINTS/PURPOSE OF CONSULTATION:  Large B-Cell Lymphoma  HISTORY OF PRESENTING ILLNESS:  Lindsey Price is a wonderful 75 y.o. female who is here today for transfer of care from Dr. Lindi Adie regarding f/u for evaluation and management of large B-cell lymphoma. The pt reports that she is doing well overall.  The pt reports ***  Lab results today *** of CBC w/diff and CMP is as follows: all values are WNL except for ***  On review of systems, pt reports *** and denies *** and any other symptoms.   MEDICAL HISTORY:  Past Medical History:  Diagnosis Date  . Allergy   . Anemia   . Anxiety   . Arthralgia 09/11/2011  . Asthma    cough variant asthma  . Breast cancer ([place]) 2011   RIGHT BREAST CA  . Breast cancer, left ([place]) 09/2020   left breast IDC  . Cataract   . 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 Depression   . GERD (gastroesophageal reflux disease)   . Hyperlipidemia   . Hypertension   . Hypothyroidism   . Insomnia   . Thyroid disease    hypothyroidism    SURGICAL HISTORY: Past Surgical History:  Procedure Laterality Date  . ABDOMINAL HYSTERECTOMY  1975   partial  . BREAST BIOPSY Right 09/10/2013   BENIGN  . BREAST BIOPSY Left 09/07/2011   BENIGN  . BREAST LUMPECTOMY Right 2011   . BREAST LUMPECTOMY WITH RADIOACTIVE SEED AND SENTINEL LYMPH NODE BIOPSY Left 11/05/2020   Procedure: LEFT BREAST LUMPECTOMY WITH RADIOACTIVE SEED AND SENTINEL LYMPH NODE BIOPSY;  Surgeon: Jovita Kussmaul, MD;  Location: [facility];  Service: General;  Laterality: Left;  . CATARACT EXTRACTION, BILATERAL    . COLONOSCOPY  2015  . COLONOSCOPY WITH PROPOFOL N/A 04/14/2014   Procedure: COLONOSCOPY WITH PROPOFOL;  Surgeon: Garlan Fair, MD;  Location: [facility];  Service: Endoscopy;  Laterality: N/A;  . POLYPECTOMY    . ROTATOR CUFF REPAIR Left 04/2013   Dr. Para March    SOCIAL HISTORY: Social History   Socioeconomic History  . Marital status: Married    Spouse name: Not on file  . Number of children: Not on file  . Years of education: Not on file  . Highest education level: Not on file  Occupational History  . Not on file  Tobacco Use  . Smoking status: Former Smoker    Packs/day: 1.00    Years: 15.00    Pack years: 15.00    Types: Cigarettes    Quit date: 12/09/1978    Years since quitting: 41.9  . Smokeless tobacco: Never Used  Vaping Use  . Vaping Use: Never used  Substance and Sexual Activity  . Alcohol use: Yes    Alcohol/week: 7.0 standard drinks    Types: 7 Glasses of wine per week    Comment: SOCIALLY wine  . Drug use: No  . Sexual  activity: Yes    Birth control/protection: Surgical  Other Topics Concern  . Not on file  Social History Narrative  . Not on file   Social Determinants of Health   Financial Resource Strain: Not on file  Food Insecurity: Not on file  Transportation Needs: Not on file  Physical Activity: Not on file  Stress: Not on file  Social Connections: Not on file  Intimate Partner Violence: Not At Risk  . Fear of Current or Ex-Partner: No  . Emotionally Abused: No  . Physically Abused: No  . Sexually Abused: No    FAMILY HISTORY: Family History  Problem Relation Age of Onset  . Cancer Mother        BREAST  . Cancer  Father        pancreatic  . Cancer Sister        sebacous cell carcinoma  . Cancer Maternal Aunt        lung  . Cancer Paternal Uncle        colon, stomach  . Colon cancer Neg Hx   . Colon polyps Neg Hx   . Esophageal cancer Neg Hx   . Rectal cancer Neg Hx   . Stomach cancer Neg Hx     ALLERGIES:  is allergic to advair diskus [fluticasone-salmeterol] and neosporin [neomycin-polymyxin-gramicidin].  MEDICATIONS:  Current Outpatient Medications  Medication Sig Dispense Refill  . albuterol (PROAIR HFA) 108 (90 Base) MCG/ACT inhaler Inhale 1-2 puffs into the lungs every 6 (six) hours as needed for wheezing or shortness of breath. 8 g 5  . alum & mag hydroxide-simeth (MAALOX/MYLANTA) 200-200-20 MG/5ML suspension Take 30 mLs by mouth every 4 (four) hours as needed for indigestion. 355 mL 0  . atorvastatin (LIPITOR) 10 MG tablet Take 1 tablet (10 mg total) by mouth at bedtime. 90 tablet 0  . calcium carbonate (OS-CAL - DOSED IN MG OF ELEMENTAL CALCIUM) 1250 (500 Ca) MG tablet Take 1,250 mg by mouth daily.    Derrill Memo ON 12/16/2020] cyanocobalamin (,VITAMIN B-12,) 1000 MCG/ML injection Inject 1 mL (1,000 mcg total) into the skin every 30 (thirty) days. 1 mL 0  . dexamethasone (DECADRON) 4 MG tablet Take 2 tablets (8 mg total) by mouth 2 (two) times daily with a meal. Take two times a day starting the day after chemotherapy for 3 days. 30 tablet 1  . esomeprazole (NEXIUM) 20 MG capsule Take 20 mg by mouth daily at 12 noon.    . ezetimibe (ZETIA) 10 MG tablet Take 1 tablet (10 mg total) by mouth daily. 90 tablet 3  . famotidine (PEPCID) 20 MG tablet TAKE 1 TABLET (20 MG TOTAL) BY MOUTH 2 (TWO) TIMES DAILY AS NEEDED FOR HEARTBURN OR INDIGESTION. 60 tablet 1  . feeding supplement (ENSURE ENLIVE / ENSURE PLUS) LIQD Take 237 mLs by mouth 2 (two) times daily between meals. 237 mL 12  . fluticasone (FLONASE) 50 MCG/ACT nasal spray PLACE 1 SPRAY INTO BOTH NOSTRILS DAILY. (Patient not taking: Reported on  11/15/2020) 48 mL 1  . HYDROcodone-acetaminophen (NORCO/VICODIN) 5-325 MG tablet Take 1-2 tablets by mouth every 6 (six) hours as needed for moderate pain or severe pain. 10 tablet 0  . ibuprofen (ADVIL) 600 MG tablet Take 1 tablet (600 mg total) by mouth every 8 (eight) hours as needed. (Patient not taking: Reported on 11/15/2020) 30 tablet 0  . levocetirizine (XYZAL) 5 MG tablet Take 5 mg by mouth daily.    Marland Kitchen levothyroxine (SYNTHROID) 100 MCG tablet TAKE 1  TABLET EVERY OTHER DAY ALTERNATING WITH 88 MCG TABLET (Patient taking differently: Take 100 mcg by mouth daily before breakfast.) 45 tablet 1  . levothyroxine (SYNTHROID) 88 MCG tablet * (Patient not taking: Reported on 11/15/2020) 45 tablet 1  . LORazepam (ATIVAN) 0.5 MG tablet Take 1 tablet (0.5 mg total) by mouth every 6 (six) hours as needed (Nausea or vomiting). 30 tablet 0  . montelukast (SINGULAIR) 10 MG tablet * (Patient taking differently: Take 10 mg by mouth daily.) 90 tablet 1  . Multiple Vitamin (MULTIVITAMIN WITH MINERALS) TABS tablet Take 1 tablet by mouth daily.    Marland Kitchen omega-3 acid ethyl esters (LOVAZA) 1 g capsule TAKE 2 CAPSULES (2 G TOTAL) BY MOUTH 2 (TWO) TIMES DAILY. 360 capsule 0  . ondansetron (ZOFRAN) 8 MG tablet Take 1 tablet (8 mg total) by mouth 2 (two) times daily. Take two times a day starting the day after chemo for 3 days. Then take two times a day as needed for nausea or vomiting. 30 tablet 1  . polyethylene glycol (MIRALAX / GLYCOLAX) 17 g packet Take 17 g by mouth daily. 14 each 0  . prochlorperazine (COMPAZINE) 10 MG tablet Take 1 tablet (10 mg total) by mouth every 6 (six) hours as needed (Nausea or vomiting). 30 tablet 1  . prochlorperazine (COMPAZINE) 25 MG suppository Place 1 suppository (25 mg total) rectally every 12 (twelve) hours as needed for nausea. 12 suppository 3  . senna-docusate (SENOKOT-S) 8.6-50 MG tablet Take 1 tablet by mouth 2 (two) times daily.    . Sodium Chloride-Sodium Bicarb (SODIUM  BICARBONATE/SODIUM CHLORIDE) SOLN 1 application by Mouth Rinse route as needed for dry mouth.    . traMADol (ULTRAM) 50 MG tablet Take 1 tablet (50 mg total) by mouth every 6 (six) hours as needed (mild pain). 10 tablet 0  . venlafaxine XR (EFFEXOR-XR) 75 MG 24 hr capsule TAKE 1 CAPSULE BY MOUTH EVERY DAY AT BEDTIME (Patient taking differently: Take 75 mg by mouth daily.) 90 capsule 4  . Vitamin D, Ergocalciferol, (DRISDOL) 1.25 MG (50000 UT) CAPS capsule TAKE 1 CAPSULE BY MOUTH ONCE WEEKLY (Patient taking differently: Take 50,000 Units by mouth every Friday.) 12 capsule 1   No current facility-administered medications for this visit.    REVIEW OF SYSTEMS:   10 Point review of Systems was done is negative except as noted above.  PHYSICAL EXAMINATION: ECOG PERFORMANCE STATUS: {CHL ONC ECOG ZY:[id number]}  .There were no vitals filed for this visit. There were no vitals filed for this visit. .There is no height or weight on file to calculate BMI.  *** GENERAL:alert, in no acute distress and comfortable SKIN: no acute rashes, no significant lesions EYES: conjunctiva are pink and non-injected, sclera anicteric OROPHARYNX: MMM, no exudates, no oropharyngeal erythema or ulceration NECK: supple, no JVD LYMPH:  no palpable lymphadenopathy in the cervical, axillary or inguinal regions LUNGS: clear to auscultation b/l with normal respiratory effort HEART: regular rate & rhythm ABDOMEN:  normoactive bowel sounds , non tender, not distended. Extremity: no pedal edema PSYCH: alert & oriented x 3 with fluent speech NEURO: no focal motor/sensory deficits  LABORATORY DATA:  I have reviewed the data as listed  . CBC Latest Ref Rng & Units 11/19/2020 11/18/2020 11/17/2020  WBC 4.0 - 10.5 K/uL 7.5 8.8 9.8  Hemoglobin 12.0 - 15.0 g/dL 8.0(L) 8.1(L) 8.3(L)  Hematocrit 36.0 - 46.0 % 25.7(L) 26.4(L) 26.7(L)  Platelets 150 - 400 K/uL 272 351 330    . CMP Latest  Ref Rng & Units 11/19/2020 11/18/2020  11/17/2020  Glucose 70 - 99 mg/dL 88 82 89  BUN 8 - 23 mg/dL 25(H) 26(H) 26(H)  Creatinine 0.44 - 1.00 mg/dL 0.67 0.71 0.66  Sodium 135 - 145 mmol/L 140 143 139  Potassium 3.5 - 5.1 mmol/L 3.4(L) 3.7 4.3  Chloride 98 - 111 mmol/L 107 110 109  CO2 22 - 32 mmol/L $RemoveB'26 25 26  'nKKMIJCn$ Calcium 8.9 - 10.3 mg/dL 8.4(L) 8.7(L) 8.6(L)  Total Protein 6.5 - 8.1 g/dL 5.3(L) 5.3(L) 5.2(L)  Total Bilirubin 0.3 - 1.2 mg/dL 0.8 0.6 0.8  Alkaline Phos 38 - 126 U/L 62 67 73  AST 15 - 41 U/L 12(L) 11(L) 12(L)  ALT 0 - 44 U/L $Remo'18 20 24     'IMTTb$ RADIOGRAPHIC STUDIES: I have personally reviewed the radiological images as listed and agreed with the findings in the report. NM Sentinel Node Inj-No Rpt (Breast)  Result Date: 11/05/2020 Sulfur colloid was injected by the nuclear medicine technologist for melanoma sentinel node.   MM Breast Surgical Specimen  Result Date: 11/05/2020 CLINICAL DATA:  Left lumpectomy for breast cancer. EXAM: SPECIMEN RADIOGRAPH OF THE LEFT BREAST COMPARISON:  Previous exam(s). FINDINGS: Status post excision of the left breast. The radioactive seed and ribbon shaped biopsy marker clip are present, completely intact. IMPRESSION: Specimen radiograph of the left breast. Electronically Signed   By: Claudie Revering M.D.   On: 11/05/2020 12:58   ECHOCARDIOGRAM COMPLETE  Result Date: 11/15/2020    ECHOCARDIOGRAM REPORT   Patient Name:   Marijean HUNT [place] Date of Exam: 11/15/2020 Medical Rec #:  [id number]           Height:       66.0 in Accession #:    [id number]          Weight:       126.1 lb Date of Birth:  [birth year]           BSA:          1.644 m Patient Age:    28 years            BP:           95/53 mmHg Patient Gender: F                   HR:           91 bpm. Exam Location:  Inpatient Procedure: 2D Echo, Cardiac Doppler and Color Doppler STAT ECHO Indications:    Chemo evaluation  History:        Patient has no prior history of Echocardiogram examinations.                 Breast cancer, Lymphoma.   Sonographer:    Dustin Flock Referring Phys: [place]  1. Left ventricular ejection fraction, by estimation, is 55 to 60%. The left ventricle has normal function. The left ventricle has no regional wall motion abnormalities. Left ventricular diastolic parameters are indeterminate. The average left ventricular global longitudinal strain is -18.9 %.  2. Right ventricular systolic function is normal. The right ventricular size is normal. There is normal pulmonary artery systolic pressure.  3. The mitral valve is normal in structure. Mild mitral valve regurgitation.  4. The aortic valve is normal in structure. Aortic valve regurgitation is not visualized. FINDINGS  Left Ventricle: Left ventricular ejection fraction, by estimation, is 55 to 60%. The left ventricle has normal function. The left ventricle has no regional  wall motion abnormalities. The average left ventricular global longitudinal strain is -18.9 %. The left ventricular internal cavity size was normal in size. There is no left ventricular hypertrophy. Left ventricular diastolic parameters are indeterminate. Right Ventricle: The right ventricular size is normal. No increase in right ventricular wall thickness. Right ventricular systolic function is normal. There is normal pulmonary artery systolic pressure. The tricuspid regurgitant velocity is 2.30 m/s, and  with an assumed right atrial pressure of 3 mmHg, the estimated right ventricular systolic pressure is 40.9 mmHg. Left Atrium: Left atrial size was normal in size. Right Atrium: Right atrial size was normal in size. Pericardium: There is no evidence of pericardial effusion. Mitral Valve: The mitral valve is normal in structure. Mild mitral valve regurgitation. Tricuspid Valve: The tricuspid valve is grossly normal. Tricuspid valve regurgitation is trivial. Aortic Valve: The aortic valve is normal in structure. Aortic valve regurgitation is not visualized. Pulmonic Valve: The  pulmonic valve was not well visualized. Pulmonic valve regurgitation is not visualized. Aorta: The aortic root and ascending aorta are structurally normal, with no evidence of dilitation. IAS/Shunts: The atrial septum is grossly normal.  LEFT VENTRICLE PLAX 2D LVIDd:         4.60 cm  Diastology LVIDs:         3.20 cm  LV e' medial:    7.40 cm/s LV PW:         1.00 cm  LV E/e' medial:  9.8 LV IVS:        0.90 cm  LV e' lateral:   8.59 cm/s LVOT diam:     2.30 cm  LV E/e' lateral: 8.4 LV SV:         116 LV SV Index:   70       2D Longitudinal Strain LVOT Area:     4.15 cm 2D Strain GLS Avg:     -18.9 %  RIGHT VENTRICLE RV Basal diam:  3.20 cm RV S prime:     10.10 cm/s LEFT ATRIUM             Index       RIGHT ATRIUM           Index LA diam:        2.70 cm 1.64 cm/m  RA Area:     13.20 cm LA Vol (A2C):   46.1 ml 28.04 ml/m RA Volume:   29.40 ml  17.89 ml/m LA Vol (A4C):   41.3 ml 25.12 ml/m LA Biplane Vol: 43.6 ml 26.52 ml/m  AORTIC VALVE LVOT Vmax:   102.00 cm/s LVOT Vmean:  67.100 cm/s LVOT VTI:    0.278 m  AORTA Ao Root diam: 2.80 cm MITRAL VALVE               TRICUSPID VALVE MV Area (PHT): 4.15 cm    TR Peak grad:   21.2 mmHg MV Decel Time: 183 msec    TR Vmax:        230.00 cm/s MV E velocity: 72.40 cm/s MV A velocity: 61.70 cm/s  SHUNTS MV E/A ratio:  1.17        Systemic VTI:  0.28 m                            Systemic Diam: 2.30 cm Mertie Moores MD Electronically signed by Mertie Moores MD Signature Date/Time: 11/15/2020/1:43:19 PM    Final    MM LT RADIOACTIVE SEED LOC  MAMMO GUIDE  Result Date: 11/03/2020 CLINICAL DATA:  75 year old female for radioactive seed localization of LEFT breast cancer prior to lumpectomy. EXAM: MAMMOGRAPHIC GUIDED RADIOACTIVE SEED LOCALIZATION OF THE LEFT BREAST COMPARISON:  Previous exam(s). FINDINGS: Patient presents for radioactive seed localization prior to LEFT lumpectomy. I met with the patient and we discussed the procedure of seed localization including benefits  and alternatives. We discussed the high likelihood of a successful procedure. We discussed the risks of the procedure including infection, bleeding, tissue injury and further surgery. We discussed the low dose of radioactivity involved in the procedure. Informed, written consent was given. The usual time-out protocol was performed immediately prior to the procedure. Using mammographic guidance, sterile technique, 1% lidocaine and an I-125 radioactive seed, the RIBBON clip was localized using a LATERAL approach. The follow-up mammogram images confirm the seed in the expected location along the LATERAL INFERIOR aspect of the mass/RIBBON clip and were marked for Dr. Marlou Starks. Follow-up survey of the patient confirms presence of the radioactive seed. Order number of I-125 seed:  [id number]. Total activity:  2.500 millicuries.  Reference Date: 10/13/2020 The patient tolerated the procedure well and was released from the [facility]. She was given instructions regarding seed removal. IMPRESSION: Radioactive seed localization LEFT breast. No apparent complications. Electronically Signed   By: Margarette Canada M.D.   On: 11/03/2020 12:16   [place] EKG SITE RITE  Result Date: 11/15/2020 If Site Rite image not attached, placement could not be confirmed due to current cardiac rhythm.   ASSESSMENT & PLAN:    PLAN: -***   -Will see back ***    FOLLOW UP: ***    All of the patients questions were answered with apparent satisfaction. The patient knows to call the clinic with any problems, questions or concerns.  I spent {CHL ONC TIME VISIT - BBCWU:[id number]} counseling the patient face to face. The total time spent in the appointment was {CHL ONC TIME VISIT - UUEKC:[id number]} and more than 50% was on counseling and direct patient cares.    Sullivan Lone MD [place] AAHIVMS [facility] Hematology/Oncology Physician [facility]  (Office):       [phone] (Work cell):  [phone] (Fax):            [phone]  11/25/2020 6:50 PM   I, Reinaldo Raddle, am acting as scribe for Dr. Sullivan Lone, MD.

## 2020-11-26 ENCOUNTER — Inpatient Hospital Stay: Payer: PPO

## 2020-11-26 ENCOUNTER — Other Ambulatory Visit: Payer: Self-pay

## 2020-11-26 ENCOUNTER — Inpatient Hospital Stay (HOSPITAL_BASED_OUTPATIENT_CLINIC_OR_DEPARTMENT_OTHER): Payer: PPO | Admitting: Hematology

## 2020-11-26 ENCOUNTER — Ambulatory Visit: Payer: PPO

## 2020-11-26 VITALS — BP 92/44 | HR 110 | Temp 99.5°F | Resp 18 | Ht 66.0 in | Wt 127.5 lb

## 2020-11-26 DIAGNOSIS — C8510 Unspecified B-cell lymphoma, unspecified site: Secondary | ICD-10-CM

## 2020-11-26 DIAGNOSIS — C8519 Unspecified B-cell lymphoma, extranodal and solid organ sites: Secondary | ICD-10-CM | POA: Diagnosis not present

## 2020-11-26 LAB — CMP (CANCER CENTER ONLY)
ALT: 20 U/L (ref 0–44)
AST: 7 U/L — ABNORMAL LOW (ref 15–41)
Albumin: 2.6 g/dL — ABNORMAL LOW (ref 3.5–5.0)
Alkaline Phosphatase: 73 U/L (ref 38–126)
Anion gap: 8 (ref 5–15)
BUN: 17 mg/dL (ref 8–23)
CO2: 25 mmol/L (ref 22–32)
Calcium: 8.1 mg/dL — ABNORMAL LOW (ref 8.9–10.3)
Chloride: 102 mmol/L (ref 98–111)
Creatinine: 0.66 mg/dL (ref 0.44–1.00)
GFR, Estimated: >60 mL/min (ref >60)
Glucose, Bld: 94 mg/dL (ref 70–99)
Potassium: 3.6 mmol/L (ref 3.5–5.1)
Sodium: 135 mmol/L (ref 135–145)
Total Bilirubin: 0.7 mg/dL (ref 0.3–1.2)
Total Protein: 5.4 g/dL — ABNORMAL LOW (ref 6.5–8.1)

## 2020-11-26 LAB — CBC WITH DIFFERENTIAL (CANCER CENTER ONLY)
Abs Immature Granulocytes: 0.01 10*3/uL (ref 0.00–0.07)
Basophils Absolute: 0 10*3/uL (ref 0.0–0.1)
Basophils Relative: 2 %
Eosinophils Absolute: 0 10*3/uL (ref 0.0–0.5)
Eosinophils Relative: 6 %
HCT: 24.9 % — ABNORMAL LOW (ref 36.0–46.0)
Hemoglobin: 7.8 g/dL — ABNORMAL LOW (ref 12.0–15.0)
Immature Granulocytes: 2 %
Lymphocytes Relative: 26 %
Lymphs Abs: 0.1 10*3/uL — ABNORMAL LOW (ref 0.7–4.0)
MCH: 24.8 pg — ABNORMAL LOW (ref 26.0–34.0)
MCHC: 31.3 g/dL (ref 30.0–36.0)
MCV: 79 fL — ABNORMAL LOW (ref 80.0–100.0)
Monocytes Absolute: 0.3 10*3/uL (ref 0.1–1.0)
Monocytes Relative: 47 %
Neutro Abs: 0.1 10*3/uL — CL (ref 1.7–7.7)
Neutrophils Relative %: 17 %
Platelet Count: 123 10*3/uL — ABNORMAL LOW (ref 150–400)
RBC: 3.15 MIL/uL — ABNORMAL LOW (ref 3.87–5.11)
RDW: 15.9 % — ABNORMAL HIGH (ref 11.5–15.5)
WBC Count: 0.5 10*3/uL — CL (ref 4.0–10.5)
nRBC: 0 % (ref 0.0–0.2)

## 2020-11-26 LAB — PREPARE RBC (CROSSMATCH)

## 2020-11-26 LAB — URIC ACID: Uric Acid, Serum: 4.7 mg/dL (ref 2.5–7.1)

## 2020-11-26 LAB — SAMPLE TO BLOOD BANK

## 2020-11-26 LAB — ABO/RH: ABO/RH(D): O POS

## 2020-11-26 MED ORDER — VENLAFAXINE HCL ER 37.5 MG PO CP24: 37.5000 mg | ORAL_CAPSULE | Freq: Every day | ORAL | 1 refills | Status: DC

## 2020-11-26 NOTE — Progress Notes (Signed)
Orders entered . Blood bank confirmed. Blood bank requested ABO RH test prior to preparing blood. Pt contacted and agreed to come back to [facility] for blood draw today.

## 2020-11-26 NOTE — Progress Notes (Unsigned)
CRITICAL VALUE STICKER  CRITICAL VALUE:WBC: 0.5, Hgb: 7.8, ANC: 0.1  DATE & TIME NOTIFIED: 11/26/20 09:10  MD NOTIFIED: Irene Limbo  TIME OF NOTIFICATION:09:00

## 2020-11-26 NOTE — Progress Notes (Signed)
HEMATOLOGY-ONCOLOGY PROGRESS NOTE  Encounter Date: .11/26/2020  CHIEF COMPLAINTS/PURPOSE OF CONSULTATION:  Large B-Cell Lymphoma  HISTORY OF PRESENTING ILLNESS:   Lindsey Price is a wonderful 76 y.o. 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 The pt reports that she is doing well overall. She is here today for toxicity check following completion of C1 EPOCH-R.  The pt reports that she is not a fan of the Ativan, as she feels hyper the day following. The pt notes that she has recently felt lightheaded and experienced multiple syncopes between last night and this morning. The pt has been eating well and drinking water and Ensure. The pt notes her knee has been in pain since her fall. The pt notes that following treatment she experienced indigestion and diarrhea. The pt notes that last night she was not moving fast or up for extended periods of time. The pt denies needing any medications for nausea or pain.  Lab results today 11/26/2020 of CBC w/diff and CMP is as follows: all values are WNL except for WBC of 0.5K, RBC of 3.15, Hgb of 7.8, HCT of 24.9, MCV of 79.0, MCH of 24.8, RDW of 15.9, Plt of 123K, Calcium of 8.1, Total Protein of 5.4, Albumin of 2.6, AST of 7. 11/26/2020 Uric Acid of 4.7.  On review of systems, pt reports syncope, lightheadedness, difficulty sleeping, anxiety, diarrhea, frequent urination during night, bone pains, body weakness, bruising and denies abdominal pain, hot flashes, nausea, constipation, fevers, chills, bleeding issues, bloody/black stools, leg swelling, and any other symptoms.  Oncology History  Cancer of right breast, stage 0  09/13/2009 Initial Biopsy   Right breast core needle biopsy: High-grade DCIS ER 100%, PR 93%   10/11/2009 Surgery   Right breast lumpectomy: High-grade DCIS with necrosis and microcalcifications 1 SLN negative, ER 100%, PR 93%   03/04/2010 - 03/2015 Anti-estrogen oral therapy   Aromasin 25  mg daily with Effexor 75 mg daily for hot flashes   09/27/2020 Relapse/Recurrence   Mammogram showed a 0.8cm upper outer left breast mass. Biopsy showed invasive and in situ ductal carcinoma, HER-2 equivocal by IHC (2+), negative by FISH (ratio 1.59), ER+ >95%, PR+ 85%, Ki67 10%.    Malignant neoplasm of left breast in female, estrogen receptor positive ([place])  10/12/2020 Initial Diagnosis   Malignant neoplasm of left breast in female, estrogen receptor positive ([place])   10/12/2020 Cancer Staging   Staging form: Breast, AJCC 8th Edition - Clinical stage from 10/12/2020: Stage IA (cT1b, cN0, cM0, G2, ER+, PR+, HER2-) - Signed by Nicholas Lose, MD on 10/15/2020 Stage prefix: Initial diagnosis   11/05/2020 Surgery   Left lumpectomy: Grade 1 IDC, 1.1 cm, intermediate grade DCIS, margins are negative, lymph node -0/1, ER greater than 95%, PR 85%, HER-2 negative, Ki-67 10%   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[place])  11/11/2020 Initial Diagnosis   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[place])   11/15/2020 -  Chemotherapy    Patient is on Treatment Plan: IP NON-HODGKINS LYMPHOMA EPOCH Q21D   Patient is on Antibody Plan: NON-HODGKINS LYMPHOMA RITUXIMAB Q21D    11/19/2020 -  Chemotherapy    Patient is on Treatment Plan: IP NON-HODGKINS LYMPHOMA EPOCH Q21D   Patient is on Antibody Plan: NON-HODGKINS LYMPHOMA RITUXIMAB Q21D      REVIEW OF SYSTEMS:   10 Point review of Systems was done is negative except as noted above.  PHYSICAL EXAMINATION: ECOG PERFORMANCE STATUS: 1 - Symptomatic but completely ambulatory  Vitals:   11/26/20  0910  BP: (!) 92/44  Pulse: (!) 110  Resp: 18  Temp: 99.5 F (37.5 C)  SpO2: 97%   Filed Weights   11/26/20 0910  Weight: 127 lb 8 oz (57.8 kg)    Exam was given in a wheelchair.  GENERAL:alert, in no acute distress and comfortable SKIN: no acute rashes, no significant lesions EYES: conjunctiva are pink and non-injected, sclera anicteric OROPHARYNX: MMM, no exudates, no oropharyngeal erythema  or ulceration NECK: supple, no JVD LYMPH:  no palpable lymphadenopathy in the cervical, axillary or inguinal regions LUNGS: clear to auscultation b/l with normal respiratory effort HEART: regular rate & rhythm ABDOMEN:  normoactive bowel sounds , non tender, not distended. Extremity: no pedal edema PSYCH: alert & oriented x 3 with fluent speech NEURO: no focal motor/sensory deficits   LABORATORY DATA:  I have reviewed the data as listed  CBC Latest Ref Rng & Units 11/26/2020 11/19/2020 11/18/2020  WBC 4.0 - 10.5 K/uL 0.5(LL) 7.5 8.8  Hemoglobin 12.0 - 15.0 g/dL 7.8(L) 8.0(L) 8.1(L)  Hematocrit 36.0 - 46.0 % 24.9(L) 25.7(L) 26.4(L)  Platelets 150 - 400 K/uL 123(L) 272 351    CMP Latest Ref Rng & Units 11/26/2020 11/19/2020 11/18/2020  Glucose 70 - 99 mg/dL 94 88 82  BUN 8 - 23 mg/dL 17 25(H) 26(H)  Creatinine 0.44 - 1.00 mg/dL 0.66 0.67 0.71  Sodium 135 - 145 mmol/L 135 140 143  Potassium 3.5 - 5.1 mmol/L 3.6 3.4(L) 3.7  Chloride 98 - 111 mmol/L 102 107 110  CO2 22 - 32 mmol/L $RemoveB'25 26 25  'XNKnaouN$ Calcium 8.9 - 10.3 mg/dL 8.1(L) 8.4(L) 8.7(L)  Total Protein 6.5 - 8.1 g/dL 5.4(L) 5.3(L) 5.3(L)  Total Bilirubin 0.3 - 1.2 mg/dL 0.7 0.8 0.6  Alkaline Phos 38 - 126 U/L 73 62 67  AST 15 - 41 U/L 7(L) 12(L) 11(L)  ALT 0 - 44 U/L $Remo'20 18 20     'TisLB$ NM Sentinel Node Inj-No Rpt (Breast)  Result Date: 11/05/2020 Sulfur colloid was injected by the nuclear medicine technologist for melanoma sentinel node.   MM Breast Surgical Specimen  Result Date: 11/05/2020 CLINICAL DATA:  Left lumpectomy for breast cancer. EXAM: SPECIMEN RADIOGRAPH OF THE LEFT BREAST COMPARISON:  Previous exam(s). FINDINGS: Status post excision of the left breast. The radioactive seed and ribbon shaped biopsy marker clip are present, completely intact. IMPRESSION: Specimen radiograph of the left breast. Electronically Signed   By: Claudie Revering M.D.   On: 11/05/2020 12:58   ECHOCARDIOGRAM COMPLETE  Result Date: 11/15/2020     ECHOCARDIOGRAM REPORT   Patient Name:   Lindsey Price Date of Exam: 11/15/2020 Medical Rec #:  [id number]           Height:       66.0 in Accession #:    [id number]          Weight:       126.1 lb Date of Birth:  Jan 31, 1946           BSA:          1.644 m Patient Age:    25 years            BP:           95/53 mmHg Patient Gender: F                   HR:           91 bpm. Exam Location:  Inpatient Procedure: 2D Echo, Cardiac  Doppler and Color Doppler STAT ECHO Indications:    Chemo evaluation  History:        Patient has no prior history of Echocardiogram examinations.                 Breast cancer, Lymphoma.  Sonographer:    Lavenia Atlas Referring Phys: [id number] KRISTIN R CURCIO IMPRESSIONS  1. Left ventricular ejection fraction, by estimation, is 55 to 60%. The left ventricle has normal function. The left ventricle has no regional wall motion abnormalities. Left ventricular diastolic parameters are indeterminate. The average left ventricular global longitudinal strain is -18.9 %.  2. Right ventricular systolic function is normal. The right ventricular size is normal. There is normal pulmonary artery systolic pressure.  3. The mitral valve is normal in structure. Mild mitral valve regurgitation.  4. The aortic valve is normal in structure. Aortic valve regurgitation is not visualized. FINDINGS  Left Ventricle: Left ventricular ejection fraction, by estimation, is 55 to 60%. The left ventricle has normal function. The left ventricle has no regional wall motion abnormalities. The average left ventricular global longitudinal strain is -18.9 %. The left ventricular internal cavity size was normal in size. There is no left ventricular hypertrophy. Left ventricular diastolic parameters are indeterminate. Right Ventricle: The right ventricular size is normal. No increase in right ventricular wall thickness. Right ventricular systolic function is normal. There is normal pulmonary artery systolic pressure. The  tricuspid regurgitant velocity is 2.30 m/s, and  with an assumed right atrial pressure of 3 mmHg, the estimated right ventricular systolic pressure is 24.2 mmHg. Left Atrium: Left atrial size was normal in size. Right Atrium: Right atrial size was normal in size. Pericardium: There is no evidence of pericardial effusion. Mitral Valve: The mitral valve is normal in structure. Mild mitral valve regurgitation. Tricuspid Valve: The tricuspid valve is grossly normal. Tricuspid valve regurgitation is trivial. Aortic Valve: The aortic valve is normal in structure. Aortic valve regurgitation is not visualized. Pulmonic Valve: The pulmonic valve was not well visualized. Pulmonic valve regurgitation is not visualized. Aorta: The aortic root and ascending aorta are structurally normal, with no evidence of dilitation. IAS/Shunts: The atrial septum is grossly normal.  LEFT VENTRICLE PLAX 2D LVIDd:         4.60 cm  Diastology LVIDs:         3.20 cm  LV e' medial:    7.40 cm/s LV PW:         1.00 cm  LV E/e' medial:  9.8 LV IVS:        0.90 cm  LV e' lateral:   8.59 cm/s LVOT diam:     2.30 cm  LV E/e' lateral: 8.4 LV SV:         116 LV SV Index:   70       2D Longitudinal Strain LVOT Area:     4.15 cm 2D Strain GLS Avg:     -18.9 %  RIGHT VENTRICLE RV Basal diam:  3.20 cm RV S prime:     10.10 cm/s LEFT ATRIUM             Index       RIGHT ATRIUM           Index LA diam:        2.70 cm 1.64 cm/m  RA Area:     13.20 cm LA Vol (A2C):   46.1 ml 28.04 ml/m RA Volume:   29.40 ml  17.89 ml/m LA  Vol (A4C):   41.3 ml 25.12 ml/m LA Biplane Vol: 43.6 ml 26.52 ml/m  AORTIC VALVE LVOT Vmax:   102.00 cm/s LVOT Vmean:  67.100 cm/s LVOT VTI:    0.278 m  AORTA Ao Root diam: 2.80 cm MITRAL VALVE               TRICUSPID VALVE MV Area (PHT): 4.15 cm    TR Peak grad:   21.2 mmHg MV Decel Time: 183 msec    TR Vmax:        230.00 cm/s MV E velocity: 72.40 cm/s MV A velocity: 61.70 cm/s  SHUNTS MV E/A ratio:  1.17        Systemic VTI:  0.28 m                             Systemic Diam: 2.30 cm Mertie Moores MD Electronically signed by Mertie Moores MD Signature Date/Time: 11/15/2020/1:43:19 PM    Final    MM LT RADIOACTIVE SEED LOC MAMMO GUIDE  Result Date: 11/03/2020 CLINICAL DATA:  75 year old female for radioactive seed localization of LEFT breast cancer prior to lumpectomy. EXAM: MAMMOGRAPHIC GUIDED RADIOACTIVE SEED LOCALIZATION OF THE LEFT BREAST COMPARISON:  Previous exam(s). FINDINGS: Patient presents for radioactive seed localization prior to LEFT lumpectomy. I met with the patient and we discussed the procedure of seed localization including benefits and alternatives. We discussed the high likelihood of a successful procedure. We discussed the risks of the procedure including infection, bleeding, tissue injury and further surgery. We discussed the low dose of radioactivity involved in the procedure. Informed, written consent was given. The usual time-out protocol was performed immediately prior to the procedure. Using mammographic guidance, sterile technique, 1% lidocaine and an I-125 radioactive seed, the RIBBON clip was localized using a LATERAL approach. The follow-up mammogram images confirm the seed in the expected location along the LATERAL INFERIOR aspect of the mass/RIBBON clip and were marked for Dr. Marlou Starks. Follow-up survey of the patient confirms presence of the radioactive seed. Order number of I-125 seed:  [id number]. Total activity:  5.366 millicuries.  Reference Date: 10/13/2020 The patient tolerated the procedure well and was released from the [facility]. She was given instructions regarding seed removal. IMPRESSION: Radioactive seed localization LEFT breast. No apparent complications. Electronically Signed   By: Margarette Canada M.D.   On: 11/03/2020 12:16   [place] EKG SITE RITE  Result Date: 11/15/2020 If Site Rite image not attached, placement could not be confirmed due to current cardiac rhythm.   ASSESSMENT AND PLAN:  1) large  B-cell lymphoma -CT abdomen/pelvis with contrast 10/21/2020 - "Acute colitis/enteritis of the cecum/ascending colon and the associated terminal loops of ileum. As was recently described on CT imaging, the anatomy of distal ileum, ileocecal valve, cecum is atypical and correlation with patient's prior surgical record would be useful. The acute inflammatory changes contribute to at least a partial small bowel obstruction. Primary concern of this inflammatory cystic mass/bowel of the right lower quadrant is malignancy given that the posterior margin is inseparable from the psoas muscle, adnexa, and the right ureter. Correlation with CEA may be useful. Inflammatory/infectious changes are also a consideration." -Colonoscopy performed 11/04/2020- "An infiltrative and ulcerated partially obstructing large mass was found at 85 cm proximal to the anus. 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 Uric acid normal. -Recommended pt avoid exposures to crowds and dust/outside. -Advised pt that steroids keep her BP high and the low number today is most likely her baseline. -Will decrease dose of  Effexor. Discussed potential emotional side effect as this is an antidepressant as well. -Advised pt her syncopes are due to mild anemia and lower BP. -Recommended pt increase daily salt intake. -Recommended pt wear compression socks and avoid sudden body movements. -Recommended lemon juice with salt and water. Water with electrolytes. -Continue Compazine as needed for nausea and vomiting. -Continue MiraLAX daily and Senokot-S to 1 tablet twice a day. -Recommended ice for knee pain. Voltaren gel OTC. Soft brace prn for knee support. -Will get labs next week to make sure counts bouncing back prior to C2. -Will get PET CT in less than 1 week. -Will see back in 1 week with labs.   FOLLOW UP: Please schedule PET/CT scan as ordered within the next week Port flush labs MD visit and appointment for 1 unit of PRBCs on 12/02/20 Inpatient hospitalization for cycle 2 of EPOCH from 12/06/2020 Outpatient appointment for Rituxan and Udenyca on 12/13/2020   The total time spent in the appointment was 30 minutes and more than 50% was on counseling and direct patient cares.   I, Reinaldo Raddle, am acting as scribe for Dr. Sullivan Lone, MD.   .I have reviewed the above documentation for accuracy and completeness, and I agree with the above. Brunetta Genera MD

## 2020-11-27 ENCOUNTER — Inpatient Hospital Stay: Payer: PPO

## 2020-11-27 ENCOUNTER — Other Ambulatory Visit: Payer: Self-pay

## 2020-11-27 DIAGNOSIS — C8519 Unspecified B-cell lymphoma, extranodal and solid organ sites: Secondary | ICD-10-CM | POA: Diagnosis not present

## 2020-11-27 DIAGNOSIS — C8510 Unspecified B-cell lymphoma, unspecified site: Secondary | ICD-10-CM

## 2020-11-27 MED ORDER — ACETAMINOPHEN 325 MG PO TABS
650.0000 mg | ORAL_TABLET | Freq: Once | ORAL | Status: AC
  Administered 2020-11-27: 650 mg via ORAL

## 2020-11-27 MED ORDER — DIPHENHYDRAMINE HCL 25 MG PO CAPS
ORAL_CAPSULE | ORAL | Status: AC
  Filled 2020-11-27: qty 1

## 2020-11-27 MED ORDER — METHYLPREDNISOLONE SODIUM SUCC 40 MG IJ SOLR
INTRAMUSCULAR | Status: AC
  Filled 2020-11-27: qty 1

## 2020-11-27 MED ORDER — PEGFILGRASTIM-CBQV 6 MG/0.6ML ~~LOC~~ SOSY
PREFILLED_SYRINGE | SUBCUTANEOUS | Status: AC
  Filled 2020-11-27: qty 0.6

## 2020-11-27 MED ORDER — METHYLPREDNISOLONE SODIUM SUCC 40 MG IJ SOLR
40.0000 mg | Freq: Once | INTRAMUSCULAR | Status: AC
  Administered 2020-11-27: 40 mg via INTRAVENOUS

## 2020-11-27 MED ORDER — ACETAMINOPHEN 325 MG PO TABS
ORAL_TABLET | ORAL | Status: AC
  Filled 2020-11-27: qty 2

## 2020-11-27 MED ORDER — SODIUM CHLORIDE 0.9% IV SOLUTION
250.0000 mL | Freq: Once | INTRAVENOUS | Status: AC
  Administered 2020-11-27: 250 mL via INTRAVENOUS
  Filled 2020-11-27: qty 250

## 2020-11-27 NOTE — Patient Instructions (Signed)

## 2020-11-28 LAB — TYPE AND SCREEN
ABO/RH(D): O POS
Antibody Screen: NEGATIVE
Unit division: 0
Unit division: 0

## 2020-11-28 LAB — BPAM RBC
Blood Product Expiration Date: 202204192359
Blood Product Expiration Date: 202204202359
ISSUE DATE / TIME: 202203260902
ISSUE DATE / TIME: 202203260902
Unit Type and Rh: 5100
Unit Type and Rh: 5100

## 2020-11-29 ENCOUNTER — Other Ambulatory Visit: Payer: Self-pay | Admitting: Radiology

## 2020-11-30 ENCOUNTER — Other Ambulatory Visit: Payer: Self-pay | Admitting: Radiology

## 2020-11-30 ENCOUNTER — Telehealth: Payer: Self-pay

## 2020-11-30 ENCOUNTER — Telehealth: Payer: Self-pay | Admitting: Hematology

## 2020-11-30 NOTE — Telephone Encounter (Signed)
Left message with follow-up appointments per 3/25 los. Gave option to call back to reschedule if needed.

## 2020-11-30 NOTE — Telephone Encounter (Signed)
Contacted bed placement , confirmed bed placement for 12/06/20 for 5 day stay for inpatient chemo.  Covid test scheduled 08:30 12/03/20. Pt aware. Email sent.

## 2020-12-01 ENCOUNTER — Ambulatory Visit (HOSPITAL_COMMUNITY)
Admission: RE | Admit: 2020-12-01 | Discharge: 2020-12-01 | Disposition: A | Discharge status: Home / Self Care | Payer: PPO | Source: Ambulatory Visit | Attending: Hematology | Admitting: Hematology

## 2020-12-01 ENCOUNTER — Telehealth: Payer: Self-pay | Admitting: Hematology

## 2020-12-01 ENCOUNTER — Encounter (HOSPITAL_COMMUNITY): Payer: Self-pay

## 2020-12-01 ENCOUNTER — Other Ambulatory Visit: Payer: Self-pay

## 2020-12-01 DIAGNOSIS — Z888 Allergy status to other drugs, medicaments and biological substances status: Secondary | ICD-10-CM | POA: Diagnosis not present

## 2020-12-01 DIAGNOSIS — Z79890 Hormone replacement therapy (postmenopausal): Secondary | ICD-10-CM | POA: Insufficient documentation

## 2020-12-01 DIAGNOSIS — C8510 Unspecified B-cell lymphoma, unspecified site: Secondary | ICD-10-CM

## 2020-12-01 DIAGNOSIS — C50912 Malignant neoplasm of unspecified site of left female breast: Secondary | ICD-10-CM | POA: Diagnosis not present

## 2020-12-01 DIAGNOSIS — Z452 Encounter for adjustment and management of vascular access device: Secondary | ICD-10-CM | POA: Diagnosis not present

## 2020-12-01 DIAGNOSIS — C8330 Diffuse large B-cell lymphoma, unspecified site: Secondary | ICD-10-CM | POA: Diagnosis not present

## 2020-12-01 DIAGNOSIS — Z79899 Other long term (current) drug therapy: Secondary | ICD-10-CM | POA: Diagnosis not present

## 2020-12-01 DIAGNOSIS — Z87891 Personal history of nicotine dependence: Secondary | ICD-10-CM | POA: Insufficient documentation

## 2020-12-01 HISTORY — PX: IR IMAGING GUIDED PORT INSERTION: IMG5740

## 2020-12-01 LAB — CBC WITH DIFFERENTIAL/PLATELET
Abs Immature Granulocytes: 2.4 10*3/uL — ABNORMAL HIGH (ref 0.00–0.07)
Band Neutrophils: 7 %
Basophils Absolute: 0 10*3/uL (ref 0.0–0.1)
Basophils Relative: 0 %
Eosinophils Absolute: 0 10*3/uL (ref 0.0–0.5)
Eosinophils Relative: 0 %
HCT: 35.2 % — ABNORMAL LOW (ref 36.0–46.0)
Hemoglobin: 11.2 g/dL — ABNORMAL LOW (ref 12.0–15.0)
Lymphocytes Relative: 7 %
Lymphs Abs: 1 10*3/uL (ref 0.7–4.0)
MCH: 26.4 pg (ref 26.0–34.0)
MCHC: 31.8 g/dL (ref 30.0–36.0)
MCV: 83 fL (ref 80.0–100.0)
Metamyelocytes Relative: 5 %
Monocytes Absolute: 1.5 10*3/uL — ABNORMAL HIGH (ref 0.1–1.0)
Monocytes Relative: 10 %
Myelocytes: 9 %
Neutro Abs: 9.9 10*3/uL — ABNORMAL HIGH (ref 1.7–7.7)
Neutrophils Relative %: 60 %
Platelets: 263 10*3/uL (ref 150–400)
Promyelocytes Relative: 2 %
RBC: 4.24 MIL/uL (ref 3.87–5.11)
RDW: 18.5 % — ABNORMAL HIGH (ref 11.5–15.5)
WBC: 14.8 10*3/uL — ABNORMAL HIGH (ref 4.0–10.5)
nRBC: 0.2 % (ref 0.0–0.2)

## 2020-12-01 MED ORDER — MIDAZOLAM HCL 2 MG/2ML IJ SOLN
INTRAMUSCULAR | Status: AC | PRN
  Administered 2020-12-01 (×2): 1 mg via INTRAVENOUS

## 2020-12-01 MED ORDER — FENTANYL CITRATE (PF) 100 MCG/2ML IJ SOLN
INTRAMUSCULAR | Status: AC | PRN
  Administered 2020-12-01 (×2): 50 ug via INTRAVENOUS

## 2020-12-01 MED ORDER — FENTANYL CITRATE (PF) 100 MCG/2ML IJ SOLN
INTRAMUSCULAR | Status: AC
  Filled 2020-12-01: qty 2

## 2020-12-01 MED ORDER — LIDOCAINE HCL 1 % IJ SOLN
INTRAMUSCULAR | Status: AC
  Filled 2020-12-01: qty 20

## 2020-12-01 MED ORDER — MIDAZOLAM HCL 2 MG/2ML IJ SOLN
INTRAMUSCULAR | Status: AC
  Filled 2020-12-01: qty 2

## 2020-12-01 MED ORDER — LIDOCAINE-EPINEPHRINE 1 %-1:100000 IJ SOLN
INTRAMUSCULAR | Status: AC
  Filled 2020-12-01: qty 1

## 2020-12-01 MED ORDER — HEPARIN SOD (PORK) LOCK FLUSH 100 UNIT/ML IV SOLN
INTRAVENOUS | Status: AC
  Filled 2020-12-01: qty 5

## 2020-12-01 MED ORDER — SODIUM CHLORIDE 0.9 % IV SOLN: INTRAVENOUS | Status: DC

## 2020-12-01 MED ORDER — LIDOCAINE HCL (PF) 1 % IJ SOLN
INTRAMUSCULAR | Status: AC | PRN
  Administered 2020-12-01 (×2): 10 mL via INTRADERMAL

## 2020-12-01 MED ORDER — HEPARIN SOD (PORK) LOCK FLUSH 100 UNIT/ML IV SOLN
INTRAVENOUS | Status: AC | PRN
  Administered 2020-12-01 (×2): 500 [IU] via INTRAVENOUS

## 2020-12-01 NOTE — Telephone Encounter (Signed)
Left message with follow-up appointment per 3/25 los. Gave option to call back to reschedule if needed.

## 2020-12-01 NOTE — H&P (Addendum)
Chief Complaint: Breast Cancer Lymphoma  Referring Physician(s): Fabienne Bruns  Supervising Physician: Aletta Edouard  Patient Status: [facility] - Out-pt  History of Present Illness: Lindsey Price is a 75 y.o. female with diagnosis of breast cancer.  Original diagnosis was 09/13/2009.  She underwent Right breast lumpectomy on 10/11/2009.  Unfortunately she has had a recurrence, diagnosed on 09/27/2020.  She also has a new diagnosis of Large B-cell Lymphoma.  She is here today for placement of a tunneled catheter with port.  She is NPO. No nausea/vomiting. No Fever/chills. ROS negative.   Past Medical History:  Diagnosis Date  . Allergy   . Anemia   . Anxiety   . Arthralgia 09/11/2011  . Asthma    cough variant asthma  . Breast cancer ([place]) 2011   RIGHT BREAST CA  . Breast cancer, left ([place]) 09/2020   left breast IDC  . Cataract   . 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 Depression   . GERD (gastroesophageal reflux disease)   . Hyperlipidemia   . Hypertension   . Hypothyroidism   . Insomnia   . Thyroid disease    hypothyroidism    Past Surgical History:  Procedure Laterality Date  . ABDOMINAL HYSTERECTOMY  1975   partial  . BREAST BIOPSY Right 09/10/2013   BENIGN  . BREAST BIOPSY Left 09/07/2011   BENIGN  . BREAST LUMPECTOMY Right 2011  . BREAST LUMPECTOMY WITH RADIOACTIVE SEED AND SENTINEL LYMPH NODE BIOPSY Left 11/05/2020   Procedure: LEFT BREAST LUMPECTOMY WITH RADIOACTIVE SEED AND SENTINEL LYMPH NODE BIOPSY;  Surgeon: Jovita Kussmaul, MD;  Location: [facility];  Service: General;  Laterality: Left;  . CATARACT EXTRACTION, BILATERAL    . COLONOSCOPY  2015  . COLONOSCOPY WITH PROPOFOL N/A 04/14/2014   Procedure: COLONOSCOPY WITH PROPOFOL;  Surgeon: Garlan Fair, MD;  Location: [facility];  Service: Endoscopy;  Laterality: N/A;  . POLYPECTOMY    . ROTATOR CUFF REPAIR Left 04/2013   Dr. Para March     Allergies: Advair diskus [fluticasone-salmeterol] and Neosporin [neomycin-polymyxin-gramicidin]  Medications: Prior to Admission medications   Medication Sig Start Date End Date Taking? Authorizing Provider  albuterol (PROAIR HFA) 108 (90 Base) MCG/ACT inhaler Inhale 1-2 puffs into the lungs every 6 (six) hours as needed for wheezing or shortness of breath. 05/07/20   Lauraine Rinne, NP  alum & mag hydroxide-simeth (MAALOX/MYLANTA) 200-200-20 MG/5ML suspension Take 30 mLs by mouth every 4 (four) hours as needed for indigestion. 11/19/20   Maryanna Shape, NP  atorvastatin (LIPITOR) 10 MG tablet Take 1 tablet (10 mg total) by mouth at bedtime. 11/12/19   Opalski, Neoma Laming, DO  calcium carbonate (OS-CAL - DOSED IN MG OF ELEMENTAL CALCIUM) 1250 (500 Ca) MG tablet Take 1,250 mg by mouth daily.    [provider]  cyanocobalamin (,VITAMIN B-12,) 1000 MCG/ML injection Inject 1 mL (1,000 mcg total) into the skin every 30 (thirty) days. 12/16/20   Maryanna Shape, NP  dexamethasone (DECADRON) 4 MG tablet Take 2 tablets (8 mg total) by mouth 2 (two) times daily with a meal. Take two times a day starting the day after chemotherapy for 3 days. 11/18/20   Nicholas Lose, MD  esomeprazole (NEXIUM) 20 MG capsule Take 20 mg by mouth daily at 12 noon.    [provider]  ezetimibe (ZETIA) 10 MG tablet Take 1 tablet (10 mg total) by mouth daily. 11/12/19  Opalski, Deborah, DO  famotidine (PEPCID) 20 MG tablet TAKE 1 TABLET (20 MG TOTAL) BY MOUTH 2 (TWO) TIMES DAILY AS NEEDED FOR HEARTBURN OR INDIGESTION. 11/23/20   Mauri Pole, MD  feeding supplement (ENSURE ENLIVE / ENSURE PLUS) LIQD Take 237 mLs by mouth 2 (two) times daily between meals. 11/19/20   Curcio, Roselie Awkward, NP  fluticasone (FLONASE) 50 MCG/ACT nasal spray PLACE 1 SPRAY INTO BOTH NOSTRILS DAILY. Patient not taking: Reported on 11/15/2020 09/16/20   Lauraine Rinne, NP  HYDROcodone-acetaminophen (NORCO/VICODIN) 5-325 MG tablet Take  1-2 tablets by mouth every 6 (six) hours as needed for moderate pain or severe pain. 11/05/20   Autumn Messing III, MD  ibuprofen (ADVIL) 600 MG tablet Take 1 tablet (600 mg total) by mouth every 8 (eight) hours as needed. Patient not taking: Reported on 11/15/2020 09/09/19   Mellody Dance, DO  levocetirizine (XYZAL) 5 MG tablet Take 5 mg by mouth daily.    [provider]  levothyroxine (SYNTHROID) 100 MCG tablet TAKE 1 TABLET EVERY OTHER DAY ALTERNATING WITH 88 MCG TABLET Patient taking differently: Take 100 mcg by mouth daily before breakfast. 11/24/19   Opalski, Neoma Laming, DO  levothyroxine (SYNTHROID) 88 MCG tablet * Patient not taking: Reported on 11/15/2020 08/21/19   Opalski, Neoma Laming, DO  LORazepam (ATIVAN) 0.5 MG tablet Take 1 tablet (0.5 mg total) by mouth every 6 (six) hours as needed (Nausea or vomiting). 11/20/20   Brunetta Genera, MD  montelukast (SINGULAIR) 10 MG tablet * Patient taking differently: Take 10 mg by mouth daily. 08/21/19   Mellody Dance, DO  Multiple Vitamin (MULTIVITAMIN WITH MINERALS) TABS tablet Take 1 tablet by mouth daily.    [provider]  omega-3 acid ethyl esters (LOVAZA) 1 g capsule TAKE 2 CAPSULES (2 G TOTAL) BY MOUTH 2 (TWO) TIMES DAILY. 11/13/19   Opalski, Deborah, DO  ondansetron (ZOFRAN) 8 MG tablet Take 1 tablet (8 mg total) by mouth 2 (two) times daily. Take two times a day starting the day after chemo for 3 days. Then take two times a day as needed for nausea or vomiting. 11/18/20   Nicholas Lose, MD  polyethylene glycol (MIRALAX / GLYCOLAX) 17 g packet Take 17 g by mouth daily. 11/19/20   Maryanna Shape, NP  prochlorperazine (COMPAZINE) 10 MG tablet Take 1 tablet (10 mg total) by mouth every 6 (six) hours as needed (Nausea or vomiting). 11/18/20   Nicholas Lose, MD  prochlorperazine (COMPAZINE) 25 MG suppository Place 1 suppository (25 mg total) rectally every 12 (twelve) hours as needed for nausea. 11/18/20   Nicholas Lose, MD   senna-docusate (SENOKOT-S) 8.6-50 MG tablet Take 1 tablet by mouth 2 (two) times daily. 11/19/20   Maryanna Shape, NP  Sodium Chloride-Sodium Bicarb (SODIUM BICARBONATE/SODIUM CHLORIDE) SOLN 1 application by Mouth Rinse route as needed for dry mouth. 11/19/20   Maryanna Shape, NP  traMADol (ULTRAM) 50 MG tablet Take 1 tablet (50 mg total) by mouth every 6 (six) hours as needed (mild pain). 10/04/20   Saverio Danker, PA-C  venlafaxine XR (EFFEXOR-XR) 37.5 MG 24 hr capsule Take 1 capsule (37.5 mg total) by mouth daily with breakfast. 11/26/20   Brunetta Genera, MD  Vitamin D, Ergocalciferol, (DRISDOL) 1.25 MG (50000 UT) CAPS capsule TAKE 1 CAPSULE BY MOUTH ONCE WEEKLY Patient taking differently: Take 50,000 Units by mouth every Friday. 08/21/19   Mellody Dance, DO     Family History  Problem Relation Age of Onset  .  Cancer Mother        BREAST  . Cancer Father        pancreatic  . Cancer Sister        sebacous cell carcinoma  . Cancer Maternal Aunt        lung  . Cancer Paternal Uncle        colon, stomach  . Colon cancer Neg Hx   . Colon polyps Neg Hx   . Esophageal cancer Neg Hx   . Rectal cancer Neg Hx   . Stomach cancer Neg Hx     Social History   Socioeconomic History  . Marital status: Married    Spouse name: Not on file  . Number of children: Not on file  . Years of education: Not on file  . Highest education level: Not on file  Occupational History  . Not on file  Tobacco Use  . Smoking status: Former Smoker    Packs/day: 1.00    Years: 15.00    Pack years: 15.00    Types: Cigarettes    Quit date: 12/09/1978    Years since quitting: 42.0  . Smokeless tobacco: Never Used  Vaping Use  . Vaping Use: Never used  Substance and Sexual Activity  . Alcohol use: Yes    Alcohol/week: 7.0 standard drinks    Types: 7 Glasses of wine per week    Comment: SOCIALLY wine  . Drug use: No  . Sexual activity: Yes    Birth control/protection: Surgical  Other Topics  Concern  . Not on file  Social History Narrative  . Not on file   Social Determinants of Health   Financial Resource Strain: Not on file  Food Insecurity: Not on file  Transportation Needs: Not on file  Physical Activity: Not on file  Stress: Not on file  Social Connections: Not on file     Review of Systems: A 12 point ROS discussed and pertinent positives are indicated in the HPI above.  All other systems are negative.  Review of Systems  Vital Signs: 115/56  Temp 98.2  pulse 78  Physical Exam Vitals reviewed.  Constitutional:      Appearance: Normal appearance.  HENT:     Head: Normocephalic and atraumatic.  Eyes:     Extraocular Movements: Extraocular movements intact.  Cardiovascular:     Rate and Rhythm: Normal rate and regular rhythm.  Pulmonary:     Effort: Pulmonary effort is normal. No respiratory distress.     Breath sounds: Normal breath sounds.  Abdominal:     General: There is no distension.     Palpations: Abdomen is soft.     Tenderness: There is no abdominal tenderness.  Musculoskeletal:        General: Normal range of motion.  Skin:    General: Skin is warm and dry.  Neurological:     General: No focal deficit present.     Mental Status: She is alert and oriented to person, place, and time.  Psychiatric:        Mood and Affect: Mood normal.        Behavior: Behavior normal.        Thought Content: Thought content normal.        Judgment: Judgment normal.     Imaging: NM Sentinel Node Inj-No Rpt (Breast)  Result Date: 11/05/2020 Sulfur colloid was injected by the nuclear medicine technologist for melanoma sentinel node.   MM Breast Surgical Specimen  Result Date: 11/05/2020  CLINICAL DATA:  Left lumpectomy for breast cancer. EXAM: SPECIMEN RADIOGRAPH OF THE LEFT BREAST COMPARISON:  Previous exam(s). FINDINGS: Status post excision of the left breast. The radioactive seed and ribbon shaped biopsy marker clip are present, completely intact.  IMPRESSION: Specimen radiograph of the left breast. Electronically Signed   By: Claudie Revering M.D.   On: 11/05/2020 12:58   ECHOCARDIOGRAM COMPLETE  Result Date: 11/15/2020    ECHOCARDIOGRAM REPORT   Patient Name:   Lindsey Price Date of Exam: 11/15/2020 Medical Rec #:  [id number]           Height:       66.0 in Accession #:    [id number]          Weight:       126.1 lb Date of Birth:  1946/05/14           BSA:          1.644 m Patient Age:    89 years            BP:           95/53 mmHg Patient Gender: F                   HR:           91 bpm. Exam Location:  Inpatient Procedure: 2D Echo, Cardiac Doppler and Color Doppler STAT ECHO Indications:    Chemo evaluation  History:        Patient has no prior history of Echocardiogram examinations.                 Breast cancer, Lymphoma.  Sonographer:    Dustin Flock Referring Phys: [place]  1. Left ventricular ejection fraction, by estimation, is 55 to 60%. The left ventricle has normal function. The left ventricle has no regional wall motion abnormalities. Left ventricular diastolic parameters are indeterminate. The average left ventricular global longitudinal strain is -18.9 %.  2. Right ventricular systolic function is normal. The right ventricular size is normal. There is normal pulmonary artery systolic pressure.  3. The mitral valve is normal in structure. Mild mitral valve regurgitation.  4. The aortic valve is normal in structure. Aortic valve regurgitation is not visualized. FINDINGS  Left Ventricle: Left ventricular ejection fraction, by estimation, is 55 to 60%. The left ventricle has normal function. The left ventricle has no regional wall motion abnormalities. The average left ventricular global longitudinal strain is -18.9 %. The left ventricular internal cavity size was normal in size. There is no left ventricular hypertrophy. Left ventricular diastolic parameters are indeterminate. Right Ventricle: The right  ventricular size is normal. No increase in right ventricular wall thickness. Right ventricular systolic function is normal. There is normal pulmonary artery systolic pressure. The tricuspid regurgitant velocity is 2.30 m/s, and  with an assumed right atrial pressure of 3 mmHg, the estimated right ventricular systolic pressure is 67.8 mmHg. Left Atrium: Left atrial size was normal in size. Right Atrium: Right atrial size was normal in size. Pericardium: There is no evidence of pericardial effusion. Mitral Valve: The mitral valve is normal in structure. Mild mitral valve regurgitation. Tricuspid Valve: The tricuspid valve is grossly normal. Tricuspid valve regurgitation is trivial. Aortic Valve: The aortic valve is normal in structure. Aortic valve regurgitation is not visualized. Pulmonic Valve: The pulmonic valve was not well visualized. Pulmonic valve regurgitation is not visualized. Aorta: The aortic root and ascending aorta are structurally normal, with  no evidence of dilitation. IAS/Shunts: The atrial septum is grossly normal.  LEFT VENTRICLE PLAX 2D LVIDd:         4.60 cm  Diastology LVIDs:         3.20 cm  LV e' medial:    7.40 cm/s LV PW:         1.00 cm  LV E/e' medial:  9.8 LV IVS:        0.90 cm  LV e' lateral:   8.59 cm/s LVOT diam:     2.30 cm  LV E/e' lateral: 8.4 LV SV:         116 LV SV Index:   70       2D Longitudinal Strain LVOT Area:     4.15 cm 2D Strain GLS Avg:     -18.9 %  RIGHT VENTRICLE RV Basal diam:  3.20 cm RV S prime:     10.10 cm/s LEFT ATRIUM             Index       RIGHT ATRIUM           Index LA diam:        2.70 cm 1.64 cm/m  RA Area:     13.20 cm LA Vol (A2C):   46.1 ml 28.04 ml/m RA Volume:   29.40 ml  17.89 ml/m LA Vol (A4C):   41.3 ml 25.12 ml/m LA Biplane Vol: 43.6 ml 26.52 ml/m  AORTIC VALVE LVOT Vmax:   102.00 cm/s LVOT Vmean:  67.100 cm/s LVOT VTI:    0.278 m  AORTA Ao Root diam: 2.80 cm MITRAL VALVE               TRICUSPID VALVE MV Area (PHT): 4.15 cm    TR Peak  grad:   21.2 mmHg MV Decel Time: 183 msec    TR Vmax:        230.00 cm/s MV E velocity: 72.40 cm/s MV A velocity: 61.70 cm/s  SHUNTS MV E/A ratio:  1.17        Systemic VTI:  0.28 m                            Systemic Diam: 2.30 cm Mertie Moores MD Electronically signed by Mertie Moores MD Signature Date/Time: 11/15/2020/1:43:19 PM    Final    MM LT RADIOACTIVE SEED LOC MAMMO GUIDE  Result Date: 11/03/2020 CLINICAL DATA:  75 year old female for radioactive seed localization of LEFT breast cancer prior to lumpectomy. EXAM: MAMMOGRAPHIC GUIDED RADIOACTIVE SEED LOCALIZATION OF THE LEFT BREAST COMPARISON:  Previous exam(s). FINDINGS: Patient presents for radioactive seed localization prior to LEFT lumpectomy. I met with the patient and we discussed the procedure of seed localization including benefits and alternatives. We discussed the high likelihood of a successful procedure. We discussed the risks of the procedure including infection, bleeding, tissue injury and further surgery. We discussed the low dose of radioactivity involved in the procedure. Informed, written consent was given. The usual time-out protocol was performed immediately prior to the procedure. Using mammographic guidance, sterile technique, 1% lidocaine and an I-125 radioactive seed, the RIBBON clip was localized using a LATERAL approach. The follow-up mammogram images confirm the seed in the expected location along the LATERAL INFERIOR aspect of the mass/RIBBON clip and were marked for Dr. Marlou Starks. Follow-up survey of the patient confirms presence of the radioactive seed. Order number of I-125 seed:  [id number]. Total activity:  7.026 millicuries.  Reference Date: 10/13/2020 The patient tolerated the procedure well and was released from the [place]. She was given instructions regarding seed removal. IMPRESSION: Radioactive seed localization LEFT breast. No apparent complications. Electronically Signed   By: Margarette Canada M.D.   On: 11/03/2020  12:16   [place] EKG SITE RITE  Result Date: 11/15/2020 If Site Rite image not attached, placement could not be confirmed due to current cardiac rhythm.   Labs:  CBC: Recent Labs    11/17/20 0549 11/18/20 0500 11/19/20 0543 11/26/20 0843  WBC 9.8 8.8 7.5 0.5*  HGB 8.3* 8.1* 8.0* 7.8*  HCT 26.7* 26.4* 25.7* 24.9*  PLT 330 351 272 123*    COAGS: Recent Labs    11/12/20 0950  INR 1.2    BMP: Recent Labs    12/19/19 0903 10/01/20 0835 11/17/20 0549 11/18/20 0500 11/19/20 0543 11/26/20 0843  NA 138   < > 139 143 140 135  K 4.3   < > 4.3 3.7 3.4* 3.6  CL 102   < > 109 110 107 102  CO2 25   < > $R'26 25 26 25  'Wb$ GLUCOSE 87   < > 89 82 88 94  BUN 16   < > 26* 26* 25* 17  CALCIUM 9.1   < > 8.6* 8.7* 8.4* 8.1*  CREATININE 0.80   < > 0.66 0.71 0.67 0.66  GFRNONAA 72   < > >60 >60 >60 >60  GFRAA 83  --   --   --   --   --    < > = values in this interval not displayed.    LIVER FUNCTION TESTS: Recent Labs    11/17/20 0549 11/18/20 0500 11/19/20 0543 11/26/20 0843  BILITOT 0.8 0.6 0.8 0.7  AST 12* 11* 12* 7*  ALT $Re'24 20 18 20  'kKA$ ALKPHOS 73 67 62 73  PROT 5.2* 5.3* 5.3* 5.4*  ALBUMIN 2.4* 2.4* 2.4* 2.6*    TUMOR MARKERS: No results for input(s): AFPTM, CEA, CA199, CHROMGRNA in the last 8760 hours.  Assessment and Plan:  Recurrent breast cancer and Large B-cell lymphoma.  Will proceed with image guided placement of a tunneled catheter with port today by Dr. Kathlene Cote.  Risks and benefits of image guided port-a-catheter placement was discussed with the patient including, but not limited to bleeding, infection, pneumothorax, or fibrin sheath development and need for additional procedures.  All of the patient's questions were answered, patient is agreeable to proceed. Consent signed and in chart.  Thank you for this interesting consult.  I greatly enjoyed meeting Onalee Steinbach and look forward to participating in their care.  A copy of this report was sent to the  requesting provider on this date.  Electronically Signed: Murrell Redden, PA-C   12/01/2020, 9:01 AM      I spent a total of  30 Minutes  in face to face in clinical consultation, greater than 50% of which was counseling/coordinating care for placement of a Port A Cath.

## 2020-12-01 NOTE — Discharge Instructions (Signed)
Urgent needs - Interventional Radiology on call MD [phone] ° °Wound - May remove dressing and shower in 24 to 48 hours.  Keep site clean and dry.  Replace with bandaid as needed.  Do not submerge in tub or water until site healing well. If closed with glue, glue will flake off on its own. ° °If ordered by your provider, may start Emla cream in 2 weeks or after incision is healed. ° °After completion of treatment, your provider should have you set up for monthly port flushes.  ° ° °Implanted Port Insertion, Care After °This sheet gives you information about how to care for yourself after your procedure. Your health care provider may also give you more specific instructions. If you have problems or questions, contact your health care provider. °What can I expect after the procedure? °After the procedure, it is common to have: °· Discomfort at the port insertion site. °· Bruising on the skin over the port. This should improve over 3-4 days. °Follow these instructions at home: °Port care °· After your port is placed, you will get a manufacturer's information card. The card has information about your port. Keep this card with you at all times. °· Take care of the port as told by your health care provider. Ask your health care provider if you or a family member can get training for taking care of the port at home. A home health care nurse may also take care of the port. °· Make sure to remember what type of port you have. °Incision care °· Follow instructions from your health care provider about how to take care of your port insertion site. Make sure you: °? Wash your hands with soap and water before and after you change your bandage (dressing). If soap and water are not available, use hand sanitizer. °? Change your dressing as told by your health care provider. °? Leave stitches (sutures), skin glue, or adhesive strips in place. These skin closures may need to stay in place for 2 weeks or longer. If adhesive strip  edges start to loosen and curl up, you may trim the loose edges. Do not remove adhesive strips completely unless your health care provider tells you to do that. °· Check your port insertion site every day for signs of infection. Check for: °? Redness, swelling, or pain. °? Fluid or blood. °? Warmth. °? Pus or a bad smell.  °  °  °Activity °· Return to your normal activities as told by your health care provider. Ask your health care provider what activities are safe for you. °· Do not lift anything that is heavier than 10 lb (4.5 kg), or the limit that you are told, until your health care provider says that it is safe. °General instructions °· Take over-the-counter and prescription medicines only as told by your health care provider. °· Do not take baths, swim, or use a hot tub until your health care provider approves. Ask your health care provider if you may take showers. You may only be allowed to take sponge baths. °· Do not drive for 24 hours if you were given a sedative during your procedure. °· Wear a medical alert bracelet in case of an emergency. This will tell any health care providers that you have a port. °· Keep all follow-up visits as told by your health care provider. This is important. °Contact a health care provider if: °· You cannot flush your port with saline as directed, or you cannot draw blood   from the port. °· You have a fever or chills. °· You have redness, swelling, or pain around your port insertion site. °· You have fluid or blood coming from your port insertion site. °· Your port insertion site feels warm to the touch. °· You have pus or a bad smell coming from the port insertion site. °Get help right away if: °· You have chest pain or shortness of breath. °· You have bleeding from your port that you cannot control. °Summary °· Take care of the port as told by your health care provider. Keep the manufacturer's information card with you at all times. °· Change your dressing as told by your  health care provider. °· Contact a health care provider if you have a fever or chills or if you have redness, swelling, or pain around your port insertion site. °· Keep all follow-up visits as told by your health care provider. °This information is not intended to replace advice given to you by your health care provider. Make sure you discuss any questions you have with your health care provider. °Document Revised: 03/19/2018 Document Reviewed: 03/19/2018 °Elsevier Patient Education © 2021 [employer] ° ° °Moderate Conscious Sedation, Adult, Care After °This sheet gives you information about how to care for yourself after your procedure. Your health care provider may also give you more specific instructions. If you have problems or questions, contact your health care provider. °What can I expect after the procedure? °After the procedure, it is common to have: °· Sleepiness for several hours. °· Impaired judgment for several hours. °· Difficulty with balance. °· Vomiting if you eat too soon. °Follow these instructions at home: °For the time period you were told by your health care provider: °· Rest. °· Do not participate in activities where you could fall or become injured. °· Do not drive or use machinery. °· Do not drink alcohol. °· Do not take sleeping pills or medicines that cause drowsiness. °· Do not make important decisions or sign legal documents. °· Do not take care of children on your own.  °  °  °Eating and drinking °· Follow the diet recommended by your health care provider. °· Drink enough fluid to keep your urine pale yellow. °· If you vomit: °? Drink water, juice, or soup when you can drink without vomiting. °? Make sure you have little or no nausea before eating solid foods.   °General instructions °· Take over-the-counter and prescription medicines only as told by your health care provider. °· Have a responsible adult stay with you for the time you are told. It is important to have someone help  care for you until you are awake and alert. °· Do not smoke. °· Keep all follow-up visits as told by your health care provider. This is important. °Contact a health care provider if: °· You are still sleepy or having trouble with balance after 24 hours. °· You feel light-headed. °· You keep feeling nauseous or you keep vomiting. °· You develop a rash. °· You have a fever. °· You have redness or swelling around the IV site. °Get help right away if: °· You have trouble breathing. °· You have new-onset confusion at home. °Summary °· After the procedure, it is common to feel sleepy, have impaired judgment, or feel nauseous if you eat too soon. °· Rest after you get home. Know the things you should not do after the procedure. °· Follow the diet recommended by your health care provider and drink enough   fluid to keep your urine pale yellow. °· Get help right away if you have trouble breathing or new-onset confusion at home. °This information is not intended to replace advice given to you by your health care provider. Make sure you discuss any questions you have with your health care provider. °Document Revised: 12/19/2019 Document Reviewed: 07/17/2019 °Elsevier Patient Education © 2021 [employer] ° ° °

## 2020-12-01 NOTE — Discharge Instructions (Signed)
See pre porocedure tab D/C instructions

## 2020-12-01 NOTE — Progress Notes (Incomplete)
Discharge Summary  Patient ID: Lindsey Price MRN: [id number] DOB/AGE: November 06, 1945 75 y.o.  Admit date: (Not on file) Discharge date: 12/01/2020  Discharge Diagnoses:  Active Problems:   * No active hospital problems. *  Discharged Condition: good  Discharge Labs:   CBC    Component Value Date/Time   WBC 14.8 (H) 12/01/2020 0830   RBC 4.24 12/01/2020 0830   HGB 11.2 (L) 12/01/2020 0830   HGB 7.8 (L) 11/26/2020 0843   HGB 13.7 02/06/2019 0908   HGB 14.6 05/15/2014 0908   HCT 35.2 (L) 12/01/2020 0830   HCT 41.3 02/06/2019 0908   HCT 44.9 05/15/2014 0908   PLT 263 12/01/2020 0830   PLT 123 (L) 11/26/2020 0843   PLT 322 02/06/2019 0908   MCV 83.0 12/01/2020 0830   MCV 91 02/06/2019 0908   MCV 95.6 05/15/2014 0908   MCH 26.4 12/01/2020 0830   MCHC 31.8 12/01/2020 0830   RDW 18.5 (H) 12/01/2020 0830   RDW 13.1 02/06/2019 0908   RDW 12.7 05/15/2014 0908   LYMPHSABS 1.0 12/01/2020 0830   LYMPHSABS 1.3 02/06/2019 0908   LYMPHSABS 1.5 05/15/2014 0908   MONOABS 1.5 (H) 12/01/2020 0830   MONOABS 0.5 05/15/2014 0908   EOSABS 0.0 12/01/2020 0830   EOSABS 0.2 02/06/2019 0908   EOSABS 0.1 05/27/2010 1030   BASOSABS 0.0 12/01/2020 0830   BASOSABS 0.0 02/06/2019 0908   BASOSABS 0.0 05/15/2014 0908   CMP Latest Ref Rng & Units 11/26/2020 11/19/2020 11/18/2020  Glucose 70 - 99 mg/dL 94 88 82  BUN 8 - 23 mg/dL 17 25(H) 26(H)  Creatinine 0.44 - 1.00 mg/dL 0.66 0.67 0.71  Sodium 135 - 145 mmol/L 135 140 143  Potassium 3.5 - 5.1 mmol/L 3.6 3.4(L) 3.7  Chloride 98 - 111 mmol/L 102 107 110  CO2 22 - 32 mmol/L $RemoveB'25 26 25  'nJmcqPPa$ Calcium 8.9 - 10.3 mg/dL 8.1(L) 8.4(L) 8.7(L)  Total Protein 6.5 - 8.1 g/dL 5.4(L) 5.3(L) 5.3(L)  Total Bilirubin 0.3 - 1.2 mg/dL 0.7 0.8 0.6  Alkaline Phos 38 - 126 U/L 73 62 67  AST 15 - 41 U/L 7(L) 12(L) 11(L)  ALT 0 - 44 U/L $Remo'20 18 20    'AOyfQ$ Significant Diagnostic Studies: 11/15/2020 echocardiogram showed LVEF of 55 to 60%.  Consults: None  Procedures:  11/15/2020 PICC line placement  Disposition:  There are no questions and answers to display.         Allergies as of 12/02/2020      Reactions   Advair Diskus [fluticasone-salmeterol] Other (See Comments)   Hoarseness.   Neosporin [neomycin-polymyxin-gramicidin] Itching      Medication List       Accurate as of December 01, 2020  9:03 PM. If you have any questions, ask your nurse or doctor.        albuterol 108 (90 Base) MCG/ACT inhaler Commonly known as: ProAir HFA Inhale 1-2 puffs into the lungs every 6 (six) hours as needed for wheezing or shortness of breath.   alum & mag hydroxide-simeth 200-200-20 MG/5ML suspension Commonly known as: MAALOX/MYLANTA Take 30 mLs by mouth every 4 (four) hours as needed for indigestion.   atorvastatin 10 MG tablet Commonly known as: LIPITOR Take 1 tablet (10 mg total) by mouth at bedtime.   calcium carbonate 1250 (500 Ca) MG tablet Commonly known as: OS-CAL - dosed in mg of elemental calcium Take 1,250 mg by mouth daily.   cyanocobalamin 1000 MCG/ML injection Commonly known as: (VITAMIN B-12) Inject 1 mL (  1,000 mcg total) into the skin every 30 (thirty) days. Start taking on: December 16, 2020   dexamethasone 4 MG tablet Commonly known as: DECADRON Take 2 tablets (8 mg total) by mouth 2 (two) times daily with a meal. Take two times a day starting the day after chemotherapy for 3 days.   esomeprazole 20 MG capsule Commonly known as: NEXIUM Take 20 mg by mouth daily at 12 noon.   ezetimibe 10 MG tablet Commonly known as: Zetia Take 1 tablet (10 mg total) by mouth daily.   famotidine 20 MG tablet Commonly known as: PEPCID TAKE 1 TABLET (20 MG TOTAL) BY MOUTH 2 (TWO) TIMES DAILY AS NEEDED FOR HEARTBURN OR INDIGESTION.   feeding supplement Liqd Take 237 mLs by mouth 2 (two) times daily between meals.   fluticasone 50 MCG/ACT nasal spray Commonly known as: FLONASE PLACE 1 SPRAY INTO BOTH NOSTRILS DAILY.    HYDROcodone-acetaminophen 5-325 MG tablet Commonly known as: NORCO/VICODIN Take 1-2 tablets by mouth every 6 (six) hours as needed for moderate pain or severe pain.   ibuprofen 600 MG tablet Commonly known as: ADVIL Take 1 tablet (600 mg total) by mouth every 8 (eight) hours as needed.   levocetirizine 5 MG tablet Commonly known as: XYZAL Take 5 mg by mouth daily.   levothyroxine 88 MCG tablet Commonly known as: SYNTHROID * What changed: Another medication with the same name was changed. Make sure you understand how and when to take each.   levothyroxine 100 MCG tablet Commonly known as: SYNTHROID TAKE 1 TABLET EVERY OTHER DAY ALTERNATING WITH 88 MCG TABLET What changed:   how much to take  how to take this  when to take this  additional instructions   LORazepam 0.5 MG tablet Commonly known as: Ativan Take 1 tablet (0.5 mg total) by mouth every 6 (six) hours as needed (Nausea or vomiting).   montelukast 10 MG tablet Commonly known as: SINGULAIR * What changed:   how much to take  how to take this  when to take this  additional instructions   multivitamin with minerals Tabs tablet Take 1 tablet by mouth daily.   omega-3 acid ethyl esters 1 g capsule Commonly known as: LOVAZA TAKE 2 CAPSULES (2 G TOTAL) BY MOUTH 2 (TWO) TIMES DAILY.   ondansetron 8 MG tablet Commonly known as: Zofran Take 1 tablet (8 mg total) by mouth 2 (two) times daily. Take two times a day starting the day after chemo for 3 days. Then take two times a day as needed for nausea or vomiting.   polyethylene glycol 17 g packet Commonly known as: MIRALAX / GLYCOLAX Take 17 g by mouth daily.   prochlorperazine 10 MG tablet Commonly known as: COMPAZINE Take 1 tablet (10 mg total) by mouth every 6 (six) hours as needed (Nausea or vomiting).   prochlorperazine 25 MG suppository Commonly known as: COMPAZINE Place 1 suppository (25 mg total) rectally every 12 (twelve) hours as needed for  nausea.   senna-docusate 8.6-50 MG tablet Commonly known as: Senokot-S Take 1 tablet by mouth 2 (two) times daily.   sodium bicarbonate/sodium chloride Soln 1 application by Mouth Rinse route as needed for dry mouth.   traMADol 50 MG tablet Commonly known as: ULTRAM Take 1 tablet (50 mg total) by mouth every 6 (six) hours as needed (mild pain).   venlafaxine XR 37.5 MG 24 hr capsule Commonly known as: EFFEXOR-XR Take 1 capsule (37.5 mg total) by mouth daily with breakfast.   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  The patient has been being followed by Dr. Lindi Adie in our office for her breast cancer.  The patient had a CT of the abdomen/pelvis with contrast due to abdominal pain performed on 10/21/2020 which showed acute colitis/enteritis of the cecum/ascending colon and the associated terminal loops of ileum, acute inflammatory changes contribute to at least a partial small bowel obstruction, primary concern of this inflammatory cystic mass/bowel of the right lower quadrant is malignancy given that the posterior margin is inseparable from the psoas muscle, adnexa, and right ureter.  A colonoscopy was performed on 11/04/2020 which showed an infiltrative and ulcerated partially obstructing large mass was found at 85 cm proximal  to the anus. The mass was circumferential.  This mass was biopsied and was consistent with large B-cell lymphoma.  Due to this new finding, the patient was referred to Dr. Irene Limbo for consideration of systemic chemotherapy for treatment of her B-cell lymphoma.  Hospital Course: The patient was admitted on 11/15/2020 to begin cycle #1 of EPOCH-R.  On admission, she reported that she was having increased appetite and irritability secondary to prednisone.  She was also gaining weight due to steroids.  She was not having any fevers, chills, headaches, dizziness, chest pain, shortness of breath, abdominal pain, nausea, vomiting, constipation.  On the day of admission, she had a baseline echocardiogram performed which showed an LVEF of 55 to 60%.  She also had a PICC line placed for chemotherapy administration.  Her chemotherapy started as planned on 11/15/2020.  Overall, she tolerated her chemotherapy well and reported a mild headache on day 2 which resolved with as needed analgesics.  She also reported intermittent reflux symptoms which were well controlled with daily Protonix and as needed Maalox.  She will receive rituximab as an inpatient with cycle #1.  Starting September 2, rituximab will be administered as an outpatient.  The patient was found to have iron deficiency anemia and vitamin B12 deficiency.  She was given a dose of Feraheme 510 mg IV on 11/16/2020 and a repeat dose will be given prior to discharge on 11/19/2020.  Additionally, she was started on vitamin B12 1000 mcg subcu and received her first dose 11/16/2020 with a repeat dose to be given prior to discharge on 11/19/2020.  Thereafter, she will receive vitamin B12 subcu monthly in our office.  On the day of discharge, she reported that she was feeling well.  Is tolerating a soft diet.  She does not report any nausea, vomiting, constipation.  Plan is for discharge once Cytoxan, rituximab, Feraheme are completed.  She had mild hypokalemia on the day of  discharge and was given a dose of K-Dur 20 mEq x 1.  She was advised to follow-up with cancer center 11/22/2020 for a Neulasta injection.  She will have a formal chemo education class as well as  a lab and follow-up visit on 11/26/2020.  She is tentatively scheduled for a possible PRBC transfusion depending on her lab work on 11/27/2020.   Signed: Joseph Art 12/01/2020, 9:03 PM     ADDENDUM  Patient was personally and independently interviewed, examined and relevant elements of the discharge plan were created. All elements of the patient's discharge plan were discussed in details with Lindsey Bussing DNP. The above documentation reflects our combined findings assessment and plan.  Overall patient tolerated cycle 1 of EPOCH-R without any acute toxicities. Received 2 doses of IV Feraheme for iron deficiency. Received 2 doses of subcu B12 for vitamin B12 deficiency likely from her ileal lesion. -She has been scheduled as outpatient for her G-CSF shot on 11/22/2020 -She she will be getting additional chemotherapy education labs and see me back on 11/26/2020 -She has been tentatively scheduled for 2 units of PRBCs if needed on 11/27/2020. -We have ordered a PET CT scan and Port-A-Cath to be done as outpatient in the next 1 to 2 weeks. -Awaiting high risk lymphoma FISH panel results -Patient advised to eat soft foods discussed concerning symptoms to watch out for. -Discussed infection prevention strategies.  Sullivan Lone MD MS Total time spent discharging patient more than 30 minutes

## 2020-12-01 NOTE — Procedures (Signed)
Interventional Radiology Procedure Note  Procedure: Single Lumen Power Port Placement    Access:  Right IJ vein.  Findings: Catheter tip positioned at SVC/RA junction. Port is ready for immediate use.   Complications: None  EBL: < 10 mL  Recommendations:  - Ok to shower in 24 hours - Do not submerge for 7 days - Routine line care   Mccormick Macon T. Nikolai Wilczak, M.D Pager:  [phone]   

## 2020-12-02 ENCOUNTER — Other Ambulatory Visit: Payer: Self-pay | Admitting: *Deleted

## 2020-12-02 ENCOUNTER — Inpatient Hospital Stay: Payer: PPO

## 2020-12-02 ENCOUNTER — Inpatient Hospital Stay: Payer: PPO | Admitting: Hematology

## 2020-12-02 VITALS — BP 155/90 | HR 86 | Temp 97.6°F | Resp 17 | Ht 66.0 in | Wt 129.8 lb

## 2020-12-02 DIAGNOSIS — C8510 Unspecified B-cell lymphoma, unspecified site: Secondary | ICD-10-CM | POA: Diagnosis not present

## 2020-12-02 DIAGNOSIS — C8519 Unspecified B-cell lymphoma, extranodal and solid organ sites: Secondary | ICD-10-CM | POA: Diagnosis not present

## 2020-12-02 LAB — CMP (CANCER CENTER ONLY)
ALT: 29 U/L (ref 0–44)
AST: 17 U/L (ref 15–41)
Albumin: 2.6 g/dL — ABNORMAL LOW (ref 3.5–5.0)
Alkaline Phosphatase: 135 U/L — ABNORMAL HIGH (ref 38–126)
Anion gap: 11 (ref 5–15)
BUN: 17 mg/dL (ref 8–23)
CO2: 25 mmol/L (ref 22–32)
Calcium: 8.3 mg/dL — ABNORMAL LOW (ref 8.9–10.3)
Chloride: 102 mmol/L (ref 98–111)
Creatinine: 0.65 mg/dL (ref 0.44–1.00)
GFR, Estimated: >60 mL/min (ref >60)
Glucose, Bld: 107 mg/dL — ABNORMAL HIGH (ref 70–99)
Potassium: 4.3 mmol/L (ref 3.5–5.1)
Sodium: 138 mmol/L (ref 135–145)
Total Bilirubin: 0.3 mg/dL (ref 0.3–1.2)
Total Protein: 5.8 g/dL — ABNORMAL LOW (ref 6.5–8.1)

## 2020-12-02 LAB — CBC WITH DIFFERENTIAL (CANCER CENTER ONLY)
Abs Immature Granulocytes: 3.04 10*3/uL — ABNORMAL HIGH (ref 0.00–0.07)
Basophils Absolute: 0 10*3/uL (ref 0.0–0.1)
Basophils Relative: 0 %
Eosinophils Absolute: 0.1 10*3/uL (ref 0.0–0.5)
Eosinophils Relative: 1 %
HCT: 33.2 % — ABNORMAL LOW (ref 36.0–46.0)
Hemoglobin: 10.7 g/dL — ABNORMAL LOW (ref 12.0–15.0)
Immature Granulocytes: 24 %
Lymphocytes Relative: 9 %
Lymphs Abs: 1.1 10*3/uL (ref 0.7–4.0)
MCH: 26.3 pg (ref 26.0–34.0)
MCHC: 32.2 g/dL (ref 30.0–36.0)
MCV: 81.6 fL (ref 80.0–100.0)
Monocytes Absolute: 1.2 10*3/uL — ABNORMAL HIGH (ref 0.1–1.0)
Monocytes Relative: 10 %
Neutro Abs: 7 10*3/uL (ref 1.7–7.7)
Neutrophils Relative %: 56 %
Platelet Count: 311 10*3/uL (ref 150–400)
RBC: 4.07 MIL/uL (ref 3.87–5.11)
RDW: 17.9 % — ABNORMAL HIGH (ref 11.5–15.5)
WBC Count: 12.5 10*3/uL — ABNORMAL HIGH (ref 4.0–10.5)
nRBC: 0.2 % (ref 0.0–0.2)

## 2020-12-02 LAB — LACTATE DEHYDROGENASE: LDH: 227 U/L — ABNORMAL HIGH (ref 98–192)

## 2020-12-02 LAB — SAMPLE TO BLOOD BANK

## 2020-12-02 LAB — URIC ACID: Uric Acid, Serum: 4.2 mg/dL (ref 2.5–7.1)

## 2020-12-02 MED ORDER — MIRTAZAPINE 7.5 MG PO TABS: 7.5000 mg | ORAL_TABLET | Freq: Every day | ORAL | 0 refills | Status: DC

## 2020-12-02 NOTE — Progress Notes (Signed)
HEMATOLOGY-ONCOLOGY PROGRESS NOTE   Date of Encounter: 12/02/2020  CHIEF COMPLAINT /PURPOSE OF CONSULTATION: Large B-Cell Lymphoma   HISTORY OF PRESENTING ILLNESS See previous note.    INTERVAL HISTORY  Lindsey Price is a wonderful 75 y.o. 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 The patient's last visit with [place] was on 11/26/2020. The pt reports that she is doing well overall. The pt is here for toxicity check prior to starting [employer] on 04/04.  The pt reports no new symptoms or concerns. She notes she has better and worse days dependant upon fatigue. Compared to her baseline, the pt notes she is eating fairly similar. The pt notes she was prescribed Pepcid by another doctor and this helped her heartburn. The pt notes that she was experiencing bone pains after the GF shot, but was helped with the pain medications. The pt received her Port yesterday, and only has Tylenol for this pain she feels. The pt notes she takes the Vicodin, and that the Tramadol did not help. She is solely using the Vicodin.  Lab results today 12/02/2020 of CBC w/diff and CMP is as follows: all values are WNL except for WBC of 12.5K, Hgb of 10.7, HCT of 33.2, RDW of 17.9, Glucose of 107, Calcium of 8.3, Total Protein of 5.8, Albumin of 2.6, Alkaline Phosphatase of 135. 12/02/2020 LDH pending. 12/02/2020 Uric Acid of 4.2.  On review of systems, pt reports fatigue and denies fevers, chills, abdominal pain, leg swelling, nausea, vomiting, diarrhea,  back pain, and any other symptoms.  Oncology History  Cancer of right breast, stage 0  09/13/2009 Initial Biopsy   Right breast core needle biopsy: High-grade DCIS ER 100%, PR 93%   10/11/2009 Surgery   Right breast lumpectomy: High-grade DCIS with necrosis and microcalcifications 1 SLN negative, ER 100%, PR 93%   03/04/2010 - 03/2015 Anti-estrogen oral therapy   Aromasin 25 mg daily with Effexor 75 mg daily  for hot flashes   09/27/2020 Relapse/Recurrence   Mammogram showed a 0.8cm upper outer left breast mass. Biopsy showed invasive and in situ ductal carcinoma, HER-2 equivocal by IHC (2+), negative by FISH (ratio 1.59), ER+ >95%, PR+ 85%, Ki67 10%.    Malignant neoplasm of left breast in female, estrogen receptor positive ([place])  10/12/2020 Initial Diagnosis   Malignant neoplasm of left breast in female, estrogen receptor positive ([place])   10/12/2020 Cancer Staging   Staging form: Breast, AJCC 8th Edition - Clinical stage from 10/12/2020: Stage IA (cT1b, cN0, cM0, G2, ER+, PR+, HER2-) - Signed by Nicholas Lose, MD on 10/15/2020 Stage prefix: Initial diagnosis   11/05/2020 Surgery   Left lumpectomy: Grade 1 IDC, 1.1 cm, intermediate grade DCIS, margins are negative, lymph node -0/1, ER greater than 95%, PR 85%, HER-2 negative, Ki-67 10%   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[place])  11/11/2020 Initial Diagnosis   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[place])   11/15/2020 -  Chemotherapy    Patient is on Treatment Plan: IP NON-HODGKINS LYMPHOMA EPOCH Q21D   Patient is on Antibody Plan: NON-HODGKINS LYMPHOMA RITUXIMAB Q21D    11/19/2020 -  Chemotherapy    Patient is on Treatment Plan: IP NON-HODGKINS LYMPHOMA EPOCH Q21D   Patient is on Antibody Plan: NON-HODGKINS LYMPHOMA RITUXIMAB Q21D      REVIEW OF SYSTEMS:   10 Point review of Systems was done is negative except as noted above.  PHYSICAL EXAMINATION: ECOG PERFORMANCE STATUS: 1 - Symptomatic but completely ambulatory  Vitals:  12/02/20 0935  BP: (!) 155/90  Pulse: 86  Resp: 17  Temp: 97.6 F (36.4 C)  SpO2: 100%   Filed Weights   12/02/20 0935  Weight: 129 lb 12.8 oz (58.9 kg)    GENERAL:alert, in no acute distress and comfortable SKIN: no acute rashes, no significant lesions EYES: conjunctiva are pink and non-injected, sclera anicteric OROPHARYNX: MMM, no exudates, no oropharyngeal erythema or ulceration NECK: supple, no JVD LYMPH:  no palpable  lymphadenopathy in the cervical, axillary or inguinal regions LUNGS: clear to auscultation b/l with normal respiratory effort HEART: regular rate & rhythm ABDOMEN:  normoactive bowel sounds , non tender, not distended. Extremity: no pedal edema PSYCH: alert & oriented x 3 with fluent speech NEURO: no focal motor/sensory deficits  LABORATORY DATA:  I have reviewed the data as listed CMP Latest Ref Rng & Units 12/02/2020 11/26/2020 11/19/2020  Glucose 70 - 99 mg/dL 107(H) 94 88  BUN 8 - 23 mg/dL 17 17 25(H)  Creatinine 0.44 - 1.00 mg/dL 0.65 0.66 0.67  Sodium 135 - 145 mmol/L 138 135 140  Potassium 3.5 - 5.1 mmol/L 4.3 3.6 3.4(L)  Chloride 98 - 111 mmol/L 102 102 107  CO2 22 - 32 mmol/L _0 Calcium 8.9 - 10.3 mg/dL 8.3(L) 8.1(L) 8.4(L)  Total Protein 6.5 - 8.1 g/dL 5.8(L) 5.4(L) 5.3(L)  Total Bilirubin 0.3 - 1.2 mg/dL 0.3 0.7 0.8  Alkaline Phos 38 - 126 U/L 135(H) 73 62  AST 15 - 41 U/L 17 7(L) 12(L)  ALT 0 - 44 U/L _1 Lab Results  Component Value Date   WBC 12.5 (H) 12/02/2020   HGB 10.7 (L) 12/02/2020   HCT 33.2 (L) 12/02/2020   MCV 81.6 12/02/2020   PLT 311 12/02/2020   NEUTROABS 7.0 12/02/2020    NM Sentinel Node Inj-No Rpt (Breast)  Result Date: 11/05/2020 Sulfur colloid was injected by the nuclear medicine technologist for melanoma sentinel node.   MM Breast Surgical Specimen  Result Date: 11/05/2020 CLINICAL DATA:  Left lumpectomy for breast cancer. EXAM: SPECIMEN RADIOGRAPH OF THE LEFT BREAST COMPARISON:  Previous exam(s). FINDINGS: Status post excision of the left breast. The radioactive seed and ribbon shaped biopsy marker clip are present, completely intact. IMPRESSION: Specimen radiograph of the left breast. Electronically Signed   By: Claudie Revering M.D.   On: 11/05/2020 12:58   ECHOCARDIOGRAM COMPLETE  Result Date: 11/15/2020    ECHOCARDIOGRAM REPORT   Patient Name:   Lindsey Price Date of Exam: 11/15/2020 Medical Rec #:  [id number]            Height:       66.0 in Accession #:    [id number]          Weight:       126.1 lb Date of Birth:  Mar 10, 1946           BSA:          1.644 m Patient Age:    46 years            BP:           95/53 mmHg Patient Gender: F                   HR:           91 bpm. Exam Location:  Inpatient Procedure: 2D Echo, Cardiac Doppler and Color Doppler STAT ECHO Indications:    Chemo evaluation  History:  Patient has no prior history of Echocardiogram examinations.                 Breast cancer, Lymphoma.  Sonographer:    Dustin Flock Referring Phys: [place]  1. Left ventricular ejection fraction, by estimation, is 55 to 60%. The left ventricle has normal function. The left ventricle has no regional wall motion abnormalities. Left ventricular diastolic parameters are indeterminate. The average left ventricular global longitudinal strain is -18.9 %.  2. Right ventricular systolic function is normal. The right ventricular size is normal. There is normal pulmonary artery systolic pressure.  3. The mitral valve is normal in structure. Mild mitral valve regurgitation.  4. The aortic valve is normal in structure. Aortic valve regurgitation is not visualized. FINDINGS  Left Ventricle: Left ventricular ejection fraction, by estimation, is 55 to 60%. The left ventricle has normal function. The left ventricle has no regional wall motion abnormalities. The average left ventricular global longitudinal strain is -18.9 %. The left ventricular internal cavity size was normal in size. There is no left ventricular hypertrophy. Left ventricular diastolic parameters are indeterminate. Right Ventricle: The right ventricular size is normal. No increase in right ventricular wall thickness. Right ventricular systolic function is normal. There is normal pulmonary artery systolic pressure. The tricuspid regurgitant velocity is 2.30 m/s, and  with an assumed right atrial pressure of 3 mmHg, the estimated right  ventricular systolic pressure is 39.7 mmHg. Left Atrium: Left atrial size was normal in size. Right Atrium: Right atrial size was normal in size. Pericardium: There is no evidence of pericardial effusion. Mitral Valve: The mitral valve is normal in structure. Mild mitral valve regurgitation. Tricuspid Valve: The tricuspid valve is grossly normal. Tricuspid valve regurgitation is trivial. Aortic Valve: The aortic valve is normal in structure. Aortic valve regurgitation is not visualized. Pulmonic Valve: The pulmonic valve was not well visualized. Pulmonic valve regurgitation is not visualized. Aorta: The aortic root and ascending aorta are structurally normal, with no evidence of dilitation. IAS/Shunts: The atrial septum is grossly normal.  LEFT VENTRICLE PLAX 2D LVIDd:         4.60 cm  Diastology LVIDs:         3.20 cm  LV e' medial:    7.40 cm/s LV PW:         1.00 cm  LV E/e' medial:  9.8 LV IVS:        0.90 cm  LV e' lateral:   8.59 cm/s LVOT diam:     2.30 cm  LV E/e' lateral: 8.4 LV SV:         116 LV SV Index:   70       2D Longitudinal Strain LVOT Area:     4.15 cm 2D Strain GLS Avg:     -18.9 %  RIGHT VENTRICLE RV Basal diam:  3.20 cm RV S prime:     10.10 cm/s LEFT ATRIUM             Index       RIGHT ATRIUM           Index LA diam:        2.70 cm 1.64 cm/m  RA Area:     13.20 cm LA Vol (A2C):   46.1 ml 28.04 ml/m RA Volume:   29.40 ml  17.89 ml/m LA Vol (A4C):   41.3 ml 25.12 ml/m LA Biplane Vol: 43.6 ml 26.52 ml/m  AORTIC VALVE LVOT Vmax:  102.00 cm/s LVOT Vmean:  67.100 cm/s LVOT VTI:    0.278 m  AORTA Ao Root diam: 2.80 cm MITRAL VALVE               TRICUSPID VALVE MV Area (PHT): 4.15 cm    TR Peak grad:   21.2 mmHg MV Decel Time: 183 msec    TR Vmax:        230.00 cm/s MV E velocity: 72.40 cm/s MV A velocity: 61.70 cm/s  SHUNTS MV E/A ratio:  1.17        Systemic VTI:  0.28 m                            Systemic Diam: 2.30 cm Mertie Moores MD Electronically signed by Mertie Moores MD Signature  Date/Time: 11/15/2020/1:43:19 PM    Final    MM LT RADIOACTIVE SEED LOC MAMMO GUIDE  Result Date: 11/03/2020 CLINICAL DATA:  75 year old female for radioactive seed localization of LEFT breast cancer prior to lumpectomy. EXAM: MAMMOGRAPHIC GUIDED RADIOACTIVE SEED LOCALIZATION OF THE LEFT BREAST COMPARISON:  Previous exam(s). FINDINGS: Patient presents for radioactive seed localization prior to LEFT lumpectomy. I met with the patient and we discussed the procedure of seed localization including benefits and alternatives. We discussed the high likelihood of a successful procedure. We discussed the risks of the procedure including infection, bleeding, tissue injury and further surgery. We discussed the low dose of radioactivity involved in the procedure. Informed, written consent was given. The usual time-out protocol was performed immediately prior to the procedure. Using mammographic guidance, sterile technique, 1% lidocaine and an I-125 radioactive seed, the RIBBON clip was localized using a LATERAL approach. The follow-up mammogram images confirm the seed in the expected location along the LATERAL INFERIOR aspect of the mass/RIBBON clip and were marked for Dr. Marlou Starks. Follow-up survey of the patient confirms presence of the radioactive seed. Order number of I-125 seed:  [id number]. Total activity:  6.213 millicuries.  Reference Date: 10/13/2020 The patient tolerated the procedure well and was released from the [place]. She was given instructions regarding seed removal. IMPRESSION: Radioactive seed localization LEFT breast. No apparent complications. Electronically Signed   By: Margarette Canada M.D.   On: 11/03/2020 12:16   IR IMAGING GUIDED PORT INSERTION  Result Date: 12/01/2020 CLINICAL DATA:  Left breast carcinoma and diffuse large B-cell lymphoma. The patient requires a porta cath to begin chemotherapy. EXAM: IMPLANTED PORT A CATH PLACEMENT WITH ULTRASOUND AND FLUOROSCOPIC GUIDANCE ANESTHESIA/SEDATION: 2.0  mg IV Versed; 100 mcg IV Fentanyl Total Moderate Sedation Time:  34 minutes The patient's level of consciousness and physiologic status were continuously monitored during the procedure by Radiology nursing. FLUOROSCOPY TIME:  24 seconds.  3.0 mGy. PROCEDURE: The procedure, risks, benefits, and alternatives were explained to the patient. Questions regarding the procedure were encouraged and answered. The patient understands and consents to the procedure. A time-out was performed prior to initiating the procedure. Ultrasound was utilized to confirm patency of the right internal jugular vein. The right neck and chest were prepped with chlorhexidine in a sterile fashion, and a sterile drape was applied covering the operative field. Maximum barrier sterile technique with sterile gowns and gloves were used for the procedure. Local anesthesia was provided with 1% lidocaine. After creating a small venotomy incision, a 21 gauge needle was advanced into the right internal jugular vein under direct, real-time ultrasound guidance. Ultrasound image documentation was performed. After securing guidewire access,  an 8 Fr dilator was placed. A J-wire was kinked to measure appropriate catheter length. A subcutaneous port pocket was then created along the upper chest wall utilizing sharp and blunt dissection. Portable cautery was utilized. The pocket was irrigated with sterile saline. A single lumen power injectable port was chosen for placement. The 8 Fr catheter was tunneled from the port pocket site to the venotomy incision. The port was placed in the pocket. External catheter was trimmed to appropriate length based on guidewire measurement. At the venotomy, an 8 Fr peel-away sheath was placed over a guidewire. The catheter was then placed through the sheath and the sheath removed. Final catheter positioning was confirmed and documented with a fluoroscopic spot image. The port was accessed with a needle and aspirated and flushed  with heparinized saline. The access needle was removed. The venotomy and port pocket incisions were closed with subcutaneous 3-0 Monocryl and subcuticular 4-0 Vicryl. Dermabond was applied to both incisions. COMPLICATIONS: COMPLICATIONS None FINDINGS: After catheter placement, the tip lies at the cavo-atrial junction. The catheter aspirates normally and is ready for immediate use. IMPRESSION: Placement of single lumen port a cath via right internal jugular vein. The catheter tip lies at the cavo-atrial junction. A power injectable port a cath was placed and is ready for immediate use. Electronically Signed   By: Aletta Edouard M.D.   On: 12/01/2020 11:20   [place] EKG SITE RITE  Result Date: 11/15/2020 If Site Rite image not attached, placement could not be confirmed due to current cardiac rhythm.   ASSESSMENT AND PLAN:  1) large B-cell lymphoma -CT abdomen/pelvis with contrast 10/21/2020 - "Acute colitis/enteritis of the cecum/ascending colon and the associated terminal loops of ileum. As was recently described on CT imaging, the anatomy of distal ileum, ileocecal valve, cecum is atypical and correlation with patient's prior surgical record would be useful. The acute inflammatory changes contribute to at least a partial small bowel obstruction. Primary concern of this inflammatory cystic mass/bowel of the right lower quadrant is malignancy given that the posterior margin is inseparable from the psoas muscle, adnexa, and the right ureter. Correlation with CEA may be useful. Inflammatory/infectious changes are also a consideration." -Colonoscopy performed 11/04/2020- "An infiltrative and ulcerated partially obstructing large mass was found at 85 cm proximal to the anus. 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 -Plan is for adjuvant radiation therapy followed by adjuvant antiestrogen therapy  3)  iron  deficiency anemia -Labs from 11/15/2020-ferritin 34, iron 24, percent saturation 8%, TIBC 308 -Labs from 11/16/2020-vitamin B12 level 241, folate 7.4  4) history of right breast cancer diagnosed in 2011 status post lumpectomy and 5 years of antiestrogen therapy  5) depression/anxiety  6) hyperlipidemia  7) hypertension  8) hypothyroidism  9) allergies  PLAN:  1)large B-cell lymphoma -CT abdomen/pelvis with contrast 10/21/2020- "Acute colitis/enteritis of the cecum/ascending colon and the associated terminal loops of ileum. As was recently described on CT imaging, the anatomy of distal ileum, ileocecal valve, cecum is atypical and correlation with patient's prior surgical record would be useful. The acute inflammatory changes contribute to at least a partial small bowel obstruction. Primary concern of this inflammatory cystic mass/bowel of the right lower quadrant is malignancy given that the posterior margin is inseparable from the psoas muscle, adnexa, and the right ureter. Correlation with CEA may be useful. Inflammatory/infectious changes are also a consideration." -Colonoscopy performed 11/04/2020-"An infiltrative and  ulcerated partially obstructing large mass was found at 85 cm proximal to the anus. 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 -Plan is for adjuvant radiation therapy followed by adjuvant antiestrogen therapy  3) iron deficiency anemia -Labs from 11/15/2020-ferritin 34, iron 24, percent saturation 8%, TIBC 308 -Labs from 11/16/2020-vitamin B12 level 241, folate 7.4  4)history of right breast cancer diagnosed in 2011 status post lumpectomy and 5 years of antiestrogen therapy  5)depression/anxiety  6)hyperlipidemia  7)hypertension  8)hypothyroidism  9)allergies  10) Iron deficiency  11) B12 deficiency  12) GERD   PLAN:   -Discussed pt labwork today, 12/02/2020; mild anemia, Hgb improved, WBC improved, chemistries stable, Uric acid normal. - Rx Remeron- low dose for insomnia. -Advised pt she may not need laxatives on days she is not getting active chemo.  -Recommended pt call the inpatient admitting number on Monday morning at 8 am to confirm her bed and when to come. -Advised pt she does not need a blood transfusion or any infusion on Saturday. -Advised pt the Rituxan infusion will be outpatient this cycle and not have to be inpatient. She should be able to leave earlier on Friday this time. -Advised pt we will most likely have to reschedule PET scan on 04/07 due to inpatient status and insurance not allowing this. Will f/u regarding this. -Discussed [place] and pt's eligibilty. Will send referral. -Advised pt they have now approved the second booster shot for those above 50. Recommended pt receive this on week off from treatment. -Recommended pt continue to stay aware and cautious of surroundings. -Will see back on 04/14 with labs.    FOLLOW UP: Portflush, MD visit and labs on 4/14 (okay to add as last patient of the day)    All of the patients questions were answered with apparent satisfaction. The patient knows to call the clinic with any problems, questions or concerns.    The total time spent in the appointment was 20 minutes and more than 50% was on counseling and direct patient cares.   Sullivan Lone MD [place] AAHIVMS [facility] Hematology/Oncology Physician [facility]  (Office):       [phone] (Work cell):  [phone] (Fax):           [phone]   I, Reinaldo Raddle, am acting as scribe for Dr. Sullivan Lone, MD.   .I have reviewed the above documentation for accuracy and completeness, and I agree with the above. Brunetta Genera MD

## 2020-12-03 ENCOUNTER — Other Ambulatory Visit (HOSPITAL_COMMUNITY)
Admission: RE | Admit: 2020-12-03 | Discharge: 2020-12-03 | Disposition: A | Discharge status: Home / Self Care | Payer: PPO | Source: Ambulatory Visit | Attending: Hematology | Admitting: Hematology

## 2020-12-03 DIAGNOSIS — Z20822 Contact with and (suspected) exposure to covid-19: Secondary | ICD-10-CM | POA: Insufficient documentation

## 2020-12-03 DIAGNOSIS — Z01812 Encounter for preprocedural laboratory examination: Secondary | ICD-10-CM | POA: Insufficient documentation

## 2020-12-03 LAB — SARS CORONAVIRUS 2 (TAT 6-24 HRS): SARS Coronavirus 2: NEGATIVE

## 2020-12-04 ENCOUNTER — Inpatient Hospital Stay: Payer: PPO

## 2020-12-06 ENCOUNTER — Telehealth: Payer: Self-pay | Admitting: Hematology

## 2020-12-06 ENCOUNTER — Other Ambulatory Visit: Payer: Self-pay | Admitting: Hematology

## 2020-12-06 ENCOUNTER — Inpatient Hospital Stay (HOSPITAL_COMMUNITY)
Admission: RE | Admit: 2020-12-06 | Discharge: 2020-12-10 | DRG: 841 | Disposition: A | Discharge status: Home / Self Care | Payer: PPO | Source: Ambulatory Visit | Attending: Hematology | Admitting: Hematology

## 2020-12-06 ENCOUNTER — Encounter (HOSPITAL_COMMUNITY): Payer: Self-pay | Admitting: Hematology

## 2020-12-06 ENCOUNTER — Other Ambulatory Visit: Payer: Self-pay

## 2020-12-06 DIAGNOSIS — Z853 Personal history of malignant neoplasm of breast: Secondary | ICD-10-CM | POA: Diagnosis not present

## 2020-12-06 DIAGNOSIS — F419 Anxiety disorder, unspecified: Secondary | ICD-10-CM | POA: Diagnosis present

## 2020-12-06 DIAGNOSIS — Z87891 Personal history of nicotine dependence: Secondary | ICD-10-CM | POA: Diagnosis not present

## 2020-12-06 DIAGNOSIS — I10 Essential (primary) hypertension: Secondary | ICD-10-CM | POA: Diagnosis not present

## 2020-12-06 DIAGNOSIS — D509 Iron deficiency anemia, unspecified: Secondary | ICD-10-CM | POA: Diagnosis not present

## 2020-12-06 DIAGNOSIS — Z803 Family history of malignant neoplasm of breast: Secondary | ICD-10-CM | POA: Diagnosis not present

## 2020-12-06 DIAGNOSIS — K529 Noninfective gastroenteritis and colitis, unspecified: Secondary | ICD-10-CM | POA: Diagnosis present

## 2020-12-06 DIAGNOSIS — E039 Hypothyroidism, unspecified: Secondary | ICD-10-CM | POA: Diagnosis not present

## 2020-12-06 DIAGNOSIS — K56600 Partial intestinal obstruction, unspecified as to cause: Secondary | ICD-10-CM | POA: Diagnosis not present

## 2020-12-06 DIAGNOSIS — Z170 Estrogen receptor positive status [ER+]: Secondary | ICD-10-CM

## 2020-12-06 DIAGNOSIS — Z9109 Other allergy status, other than to drugs and biological substances: Secondary | ICD-10-CM | POA: Diagnosis not present

## 2020-12-06 DIAGNOSIS — Z79899 Other long term (current) drug therapy: Secondary | ICD-10-CM

## 2020-12-06 DIAGNOSIS — Z800 Family history of malignant neoplasm of digestive organs: Secondary | ICD-10-CM | POA: Diagnosis not present

## 2020-12-06 DIAGNOSIS — F32A Depression, unspecified: Secondary | ICD-10-CM | POA: Diagnosis present

## 2020-12-06 DIAGNOSIS — Z5111 Encounter for antineoplastic chemotherapy: Secondary | ICD-10-CM

## 2020-12-06 DIAGNOSIS — E785 Hyperlipidemia, unspecified: Secondary | ICD-10-CM | POA: Diagnosis present

## 2020-12-06 DIAGNOSIS — Z79890 Hormone replacement therapy (postmenopausal): Secondary | ICD-10-CM | POA: Diagnosis not present

## 2020-12-06 DIAGNOSIS — C8510 Unspecified B-cell lymphoma, unspecified site: Principal | ICD-10-CM | POA: Diagnosis present

## 2020-12-06 DIAGNOSIS — Z801 Family history of malignant neoplasm of trachea, bronchus and lung: Secondary | ICD-10-CM

## 2020-12-06 DIAGNOSIS — J45909 Unspecified asthma, uncomplicated: Secondary | ICD-10-CM | POA: Diagnosis present

## 2020-12-06 DIAGNOSIS — Z20822 Contact with and (suspected) exposure to covid-19: Secondary | ICD-10-CM | POA: Diagnosis present

## 2020-12-06 DIAGNOSIS — D0512 Intraductal carcinoma in situ of left breast: Secondary | ICD-10-CM | POA: Diagnosis present

## 2020-12-06 DIAGNOSIS — Z79811 Long term (current) use of aromatase inhibitors: Secondary | ICD-10-CM | POA: Diagnosis not present

## 2020-12-06 DIAGNOSIS — Z888 Allergy status to other drugs, medicaments and biological substances status: Secondary | ICD-10-CM

## 2020-12-06 LAB — COMPREHENSIVE METABOLIC PANEL
ALT: 22 U/L (ref 0–44)
AST: 14 U/L — ABNORMAL LOW (ref 15–41)
Albumin: 2.9 g/dL — ABNORMAL LOW (ref 3.5–5.0)
Alkaline Phosphatase: 109 U/L (ref 38–126)
Anion gap: 6 (ref 5–15)
BUN: 22 mg/dL (ref 8–23)
CO2: 25 mmol/L (ref 22–32)
Calcium: 8.2 mg/dL — ABNORMAL LOW (ref 8.9–10.3)
Chloride: 105 mmol/L (ref 98–111)
Creatinine, Ser: 0.53 mg/dL (ref 0.44–1.00)
GFR, Estimated: >60 mL/min (ref >60)
Glucose, Bld: 84 mg/dL (ref 70–99)
Potassium: 3.8 mmol/L (ref 3.5–5.1)
Sodium: 136 mmol/L (ref 135–145)
Total Bilirubin: 0.7 mg/dL (ref 0.3–1.2)
Total Protein: 5.9 g/dL — ABNORMAL LOW (ref 6.5–8.1)

## 2020-12-06 LAB — CBC WITH DIFFERENTIAL/PLATELET
Abs Immature Granulocytes: 0.59 10*3/uL — ABNORMAL HIGH (ref 0.00–0.07)
Basophils Absolute: 0.2 10*3/uL — ABNORMAL HIGH (ref 0.0–0.1)
Basophils Relative: 2 %
Eosinophils Absolute: 0.1 10*3/uL (ref 0.0–0.5)
Eosinophils Relative: 1 %
HCT: 34.3 % — ABNORMAL LOW (ref 36.0–46.0)
Hemoglobin: 10.7 g/dL — ABNORMAL LOW (ref 12.0–15.0)
Immature Granulocytes: 7 %
Lymphocytes Relative: 16 %
Lymphs Abs: 1.3 10*3/uL (ref 0.7–4.0)
MCH: 26.9 pg (ref 26.0–34.0)
MCHC: 31.2 g/dL (ref 30.0–36.0)
MCV: 86.2 fL (ref 80.0–100.0)
Monocytes Absolute: 1.3 10*3/uL — ABNORMAL HIGH (ref 0.1–1.0)
Monocytes Relative: 16 %
Neutro Abs: 4.8 10*3/uL (ref 1.7–7.7)
Neutrophils Relative %: 58 %
Platelets: 493 10*3/uL — ABNORMAL HIGH (ref 150–400)
RBC: 3.98 MIL/uL (ref 3.87–5.11)
RDW: 18.1 % — ABNORMAL HIGH (ref 11.5–15.5)
WBC: 8.1 10*3/uL (ref 4.0–10.5)
nRBC: 0 % (ref 0.0–0.2)

## 2020-12-06 LAB — LACTATE DEHYDROGENASE: LDH: 137 U/L (ref 98–192)

## 2020-12-06 LAB — URIC ACID: Uric Acid, Serum: 4.4 mg/dL (ref 2.5–7.1)

## 2020-12-06 MED ORDER — FLUTICASONE PROPIONATE 50 MCG/ACT NA SUSP
1.0000 | Freq: Every day | NASAL | Status: DC
  Administered 2020-12-07 – 2020-12-10 (×4): 1 via NASAL
  Filled 2020-12-06: qty 16

## 2020-12-06 MED ORDER — MONTELUKAST SODIUM 10 MG PO TABS
10.0000 mg | ORAL_TABLET | Freq: Every day | ORAL | Status: DC
Start: 2020-12-07 — End: 2020-12-10
  Administered 2020-12-07 – 2020-12-09 (×3): 10 mg via ORAL
  Filled 2020-12-06 (×3): qty 1

## 2020-12-06 MED ORDER — CHLORHEXIDINE GLUCONATE CLOTH 2 % EX PADS
6.0000 | MEDICATED_PAD | Freq: Every day | CUTANEOUS | Status: DC
  Administered 2020-12-06 – 2020-12-09 (×4): 6 via TOPICAL

## 2020-12-06 MED ORDER — ATORVASTATIN CALCIUM 10 MG PO TABS
10.0000 mg | ORAL_TABLET | Freq: Every day | ORAL | Status: DC
  Administered 2020-12-06 – 2020-12-09 (×4): 10 mg via ORAL
  Filled 2020-12-06 (×4): qty 1

## 2020-12-06 MED ORDER — SODIUM CHLORIDE 0.9 % IV SOLN: INTRAVENOUS | Status: DC

## 2020-12-06 MED ORDER — FAMOTIDINE 20 MG PO TABS: 20.0000 mg | ORAL_TABLET | Freq: Two times a day (BID) | ORAL | Status: DC | PRN

## 2020-12-06 MED ORDER — ADULT MULTIVITAMIN W/MINERALS CH
1.0000 | ORAL_TABLET | Freq: Every day | ORAL | Status: DC
  Administered 2020-12-07 – 2020-12-09 (×3): 1 via ORAL
  Filled 2020-12-06 (×3): qty 1

## 2020-12-06 MED ORDER — MIRTAZAPINE 15 MG PO TABS
7.5000 mg | ORAL_TABLET | Freq: Every day | ORAL | Status: DC
Start: 2020-12-06 — End: 2020-12-10
  Administered 2020-12-08: 7.5 mg via ORAL
  Filled 2020-12-06 (×2): qty 1

## 2020-12-06 MED ORDER — PANTOPRAZOLE SODIUM 40 MG PO TBEC
40.0000 mg | DELAYED_RELEASE_TABLET | Freq: Every day | ORAL | Status: DC
Start: 2020-12-07 — End: 2020-12-10
  Administered 2020-12-07 – 2020-12-10 (×4): 40 mg via ORAL
  Filled 2020-12-06 (×4): qty 1

## 2020-12-06 MED ORDER — LEVOTHYROXINE SODIUM 88 MCG PO TABS
88.0000 ug | ORAL_TABLET | ORAL | Status: DC
  Administered 2020-12-08 – 2020-12-10 (×2): 88 ug via ORAL
  Filled 2020-12-06 (×3): qty 1

## 2020-12-06 MED ORDER — SODIUM CHLORIDE 0.9 % IV SOLN
Freq: Once | INTRAVENOUS | Status: AC
  Administered 2020-12-06: 18 mg via INTRAVENOUS
  Filled 2020-12-06: qty 4

## 2020-12-06 MED ORDER — EZETIMIBE 10 MG PO TABS
10.0000 mg | ORAL_TABLET | Freq: Every day | ORAL | Status: DC
  Administered 2020-12-06 – 2020-12-10 (×5): 10 mg via ORAL
  Filled 2020-12-06 (×5): qty 1

## 2020-12-06 MED ORDER — SODIUM CHLORIDE 0.9% FLUSH: 10.0000 mL | INTRAVENOUS | Status: DC | PRN

## 2020-12-06 MED ORDER — OMEGA-3-ACID ETHYL ESTERS 1 G PO CAPS: 2.0000 g | ORAL_CAPSULE | Freq: Two times a day (BID) | ORAL | Status: DC

## 2020-12-06 MED ORDER — POLYETHYLENE GLYCOL 3350 17 G PO PACK
17.0000 g | PACK | Freq: Every day | ORAL | Status: DC
  Administered 2020-12-07 – 2020-12-10 (×4): 17 g via ORAL
  Filled 2020-12-06 (×4): qty 1

## 2020-12-06 MED ORDER — VITAMIN D (ERGOCALCIFEROL) 1.25 MG (50000 UNIT) PO CAPS
50000.0000 [IU] | ORAL_CAPSULE | ORAL | Status: DC
  Administered 2020-12-10: 50000 [IU] via ORAL
  Filled 2020-12-06: qty 1

## 2020-12-06 MED ORDER — VENLAFAXINE HCL ER 37.5 MG PO CP24
37.5000 mg | ORAL_CAPSULE | Freq: Every day | ORAL | Status: DC
  Administered 2020-12-07 – 2020-12-10 (×4): 37.5 mg via ORAL
  Filled 2020-12-06 (×4): qty 1

## 2020-12-06 MED ORDER — PROCHLORPERAZINE 25 MG RE SUPP
25.0000 mg | Freq: Two times a day (BID) | RECTAL | Status: DC | PRN
  Filled 2020-12-06: qty 1

## 2020-12-06 MED ORDER — CALCIUM CARBONATE 1250 (500 CA) MG PO TABS
1250.0000 mg | ORAL_TABLET | Freq: Every day | ORAL | Status: DC
  Administered 2020-12-07 – 2020-12-09 (×3): 1250 mg via ORAL
  Filled 2020-12-06 (×4): qty 1

## 2020-12-06 MED ORDER — LORATADINE 10 MG PO TABS
10.0000 mg | ORAL_TABLET | Freq: Every day | ORAL | Status: DC
  Administered 2020-12-07 – 2020-12-10 (×4): 10 mg via ORAL
  Filled 2020-12-06 (×4): qty 1

## 2020-12-06 MED ORDER — ETOPOSIDE CHEMO INJECTION 500 MG/25ML
Freq: Once | INTRAVENOUS | Status: AC
  Filled 2020-12-06: qty 8

## 2020-12-06 MED ORDER — SODIUM CHLORIDE 0.9% FLUSH
10.0000 mL | Freq: Two times a day (BID) | INTRAVENOUS | Status: DC
  Administered 2020-12-06 – 2020-12-07 (×3): 10 mL
  Administered 2020-12-07 – 2020-12-08 (×2): 20 mL
  Administered 2020-12-09 (×2): 10 mL

## 2020-12-06 MED ORDER — ONDANSETRON HCL 8 MG PO TABS
8.0000 mg | ORAL_TABLET | Freq: Two times a day (BID) | ORAL | Status: DC
  Administered 2020-12-06 – 2020-12-10 (×9): 8 mg via ORAL
  Filled 2020-12-06 (×9): qty 1

## 2020-12-06 MED ORDER — SENNOSIDES-DOCUSATE SODIUM 8.6-50 MG PO TABS
1.0000 | ORAL_TABLET | Freq: Two times a day (BID) | ORAL | Status: DC
  Administered 2020-12-06 – 2020-12-10 (×8): 1 via ORAL
  Filled 2020-12-06 (×8): qty 1

## 2020-12-06 MED ORDER — ALBUTEROL SULFATE HFA 108 (90 BASE) MCG/ACT IN AERS
1.0000 | INHALATION_SPRAY | Freq: Four times a day (QID) | RESPIRATORY_TRACT | Status: DC | PRN
Start: 2020-12-06 — End: 2020-12-10
  Filled 2020-12-06: qty 6.7

## 2020-12-06 MED ORDER — LORAZEPAM 0.5 MG PO TABS
0.5000 mg | ORAL_TABLET | Freq: Four times a day (QID) | ORAL | Status: DC | PRN
  Administered 2020-12-06 – 2020-12-09 (×4): 0.5 mg via ORAL
  Filled 2020-12-06 (×4): qty 1

## 2020-12-06 MED ORDER — ENOXAPARIN SODIUM 40 MG/0.4ML ~~LOC~~ SOLN
40.0000 mg | SUBCUTANEOUS | Status: DC
Start: 2020-12-06 — End: 2020-12-10
  Administered 2020-12-06 – 2020-12-09 (×4): 40 mg via SUBCUTANEOUS
  Filled 2020-12-06 (×4): qty 0.4

## 2020-12-06 MED ORDER — PROCHLORPERAZINE MALEATE 10 MG PO TABS: 10.0000 mg | ORAL_TABLET | Freq: Four times a day (QID) | ORAL | Status: DC | PRN

## 2020-12-06 MED ORDER — ENSURE ENLIVE PO LIQD
237.0000 mL | Freq: Two times a day (BID) | ORAL | Status: DC
  Administered 2020-12-06 – 2020-12-09 (×7): 237 mL via ORAL

## 2020-12-06 MED ORDER — HYDROCODONE-ACETAMINOPHEN 5-325 MG PO TABS: 1.0000 | ORAL_TABLET | Freq: Four times a day (QID) | ORAL | Status: DC | PRN

## 2020-12-06 MED ORDER — LEVOTHYROXINE SODIUM 100 MCG PO TABS
100.0000 ug | ORAL_TABLET | ORAL | Status: DC
Start: 2020-12-07 — End: 2020-12-10
  Administered 2020-12-07 – 2020-12-09 (×2): 100 ug via ORAL
  Filled 2020-12-06 (×2): qty 1

## 2020-12-06 MED ORDER — ALUM & MAG HYDROXIDE-SIMETH 200-200-20 MG/5ML PO SUSP
30.0000 mL | ORAL | Status: DC | PRN
  Administered 2020-12-10: 30 mL via ORAL
  Filled 2020-12-06: qty 30

## 2020-12-06 MED ORDER — PREDNISONE 20 MG PO TABS
60.0000 mg | ORAL_TABLET | Freq: Every day | ORAL | Status: AC
  Administered 2020-12-06 – 2020-12-10 (×5): 60 mg via ORAL
  Filled 2020-12-06 (×5): qty 3

## 2020-12-06 MED ORDER — SODIUM BICARBONATE/SODIUM CHLORIDE MOUTHWASH
1.0000 "application " | Freq: Four times a day (QID) | OROMUCOSAL | Status: DC
  Administered 2020-12-06 – 2020-12-10 (×15): 1 via OROMUCOSAL
  Filled 2020-12-06: qty 1000

## 2020-12-06 NOTE — Progress Notes (Signed)
Rapid Infusion Rituximab Pharmacist Evaluation  Lindsey Price is a 75 y.o. female being treated with rituximab for B-cell Lymphoma. This patient may be considered for RIR.   A pharmacist has verified the patient tolerated rituximab infusions per the [facility] standard infusion protocol without grade 3-4 infusion reactions. The treatment plan will be updated to reflect RIR if the patient qualifies per the checklist below:   Age > 47 years old Yes   Clinically significant cardiovascular disease No   Circulating lymphocyte count < 5000/uL prior to cycle two Yes  Lab Results  Component Value Date   LYMPHSABS 1.1 12/02/2020    Prior documented grade 3-4 infusion reaction to rituximab No   Prior documented grade 1-2 infusion reaction to rituximab (If YES, Pharmacist will confirm with Physician if patient is still a candidate for RIR) No   Previous rituximab infusion within the past 6 months Yes   Treatment Plan updated orders to reflect [place] does meet the criteria for Rapid Infusion Rituximab. This patient is going to be switched to rapid infusion rituximab.    Kennith Center, Pharm.D., CPP 12/06/2020@8 :29 AM

## 2020-12-06 NOTE — H&P (Addendum)
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  The patient has been being followed by Dr. Lindi Adie in our office for her breast cancer.  The patient had a CT of the abdomen/pelvis with contrast due to abdominal pain performed on 10/21/2020 which showed acute colitis/enteritis of the cecum/ascending colon and the associated terminal loops of ileum, acute inflammatory changes contribute to at least a partial small bowel obstruction, primary concern of this inflammatory cystic mass/bowel of the right lower quadrant is malignancy given that the posterior margin is inseparable from the psoas muscle, adnexa, and right ureter.  A colonoscopy was performed on 11/04/2020 which showed an infiltrative and ulcerated partially obstructing large mass was found at 85 cm proximal to the anus. The mass was circumferential.  This mass was biopsied and was consistent with large B-cell lymphoma.  Due to this new finding, the patient was referred to Dr. Irene Limbo for consideration of systemic chemotherapy for treatment of her B-cell lymphoma.  The patient is seen today prior to cycle #2 of her chemotherapy.  She reports that she has had a good 4 to 5 days.  This morning, she denies any fevers, chills, chest pain, shortness of breath, abdominal pain, nausea, vomiting.  Denies mucositis.  Reports that bowels are moving well. The  patient is seen today for admission prior to cycle #2 of EPOCH-R.     Past Medical History:  Diagnosis Date  . Allergy   . Anemia   . Anxiety   . Arthralgia 09/11/2011  . Asthma    cough variant asthma  . Breast cancer ([place]) 2011   RIGHT BREAST CA  . Breast cancer, left ([place]) 09/2020   left breast IDC  . Cataract   . 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 Depression   . GERD (gastroesophageal reflux disease)   . Hyperlipidemia   . Hypertension   . Hypothyroidism   . Insomnia   . Thyroid disease    hypothyroidism  :   Past Surgical History:  Procedure Laterality Date  . ABDOMINAL HYSTERECTOMY  1975   partial  . BREAST BIOPSY Right 09/10/2013   BENIGN  . BREAST BIOPSY Left 09/07/2011   BENIGN  . BREAST LUMPECTOMY Right 2011  . BREAST LUMPECTOMY WITH RADIOACTIVE SEED AND SENTINEL LYMPH NODE BIOPSY Left 11/05/2020   Procedure: LEFT BREAST LUMPECTOMY WITH RADIOACTIVE SEED AND SENTINEL LYMPH NODE BIOPSY;  Surgeon: Jovita Kussmaul, MD;  Location: [facility];  Service: General;  Laterality: Left;  . CATARACT EXTRACTION, BILATERAL    . COLONOSCOPY  2015  . COLONOSCOPY WITH PROPOFOL N/A 04/14/2014   Procedure: COLONOSCOPY WITH PROPOFOL;  Surgeon: Garlan Fair, MD;  Location: [facility];  Service: Endoscopy;  Laterality: N/A;  . IR IMAGING GUIDED PORT INSERTION  12/01/2020  . POLYPECTOMY    . ROTATOR CUFF REPAIR Left 04/2013   Dr. Para March  :   No current facility-administered medications for this encounter.  Current Outpatient Medications  Medication Sig Dispense Refill  . albuterol (PROAIR HFA) 108 (90 Base) MCG/ACT inhaler Inhale 1-2 puffs into the lungs every 6 (six) hours as needed for wheezing or shortness of breath. 8 g 5  . alum & mag hydroxide-simeth (MAALOX/MYLANTA) 200-200-20 MG/5ML suspension Take 30 mLs by mouth every 4 (four) hours as needed for indigestion. 355 mL 0  . atorvastatin (LIPITOR) 10 MG tablet Take 1 tablet (10 mg  total) by mouth at bedtime. 90 tablet 0  . calcium carbonate (OS-CAL - DOSED IN MG OF ELEMENTAL CALCIUM) 1250 (500 Ca) MG tablet Take 1,250 mg by mouth daily.    Derrill Memo ON 12/16/2020] cyanocobalamin (,VITAMIN B-12,) 1000 MCG/ML injection Inject 1 mL (1,000 mcg total) into the skin every 30 (thirty) days. 1 mL 0  . dexamethasone (DECADRON) 4 MG tablet Take 2 tablets (8 mg total) by mouth 2 (two) times daily with a meal. Take two times a day starting the day after chemotherapy for 3 days. 30 tablet 1  . esomeprazole (NEXIUM) 20 MG capsule Take 20 mg by mouth daily at 12 noon.    . ezetimibe (ZETIA) 10 MG tablet Take 1 tablet (10 mg total) by mouth daily. 90 tablet 3  . famotidine (PEPCID) 20 MG tablet TAKE 1 TABLET (20 MG TOTAL) BY MOUTH 2 (TWO) TIMES DAILY AS NEEDED FOR HEARTBURN OR INDIGESTION. 60 tablet 1  . feeding supplement (ENSURE ENLIVE / ENSURE PLUS) LIQD Take 237 mLs by mouth 2 (two) times daily between meals. 237 mL 12  . fluticasone (FLONASE) 50 MCG/ACT nasal spray PLACE 1 SPRAY INTO BOTH NOSTRILS DAILY. 48 mL 1  . HYDROcodone-acetaminophen (NORCO/VICODIN) 5-325 MG tablet Take 1-2 tablets by mouth every 6 (six) hours as needed for moderate pain or severe pain. 10 tablet 0  . levocetirizine (XYZAL) 5 MG tablet Take 5 mg by mouth daily.    Marland Kitchen levothyroxine (SYNTHROID) 100 MCG tablet TAKE 1 TABLET EVERY OTHER DAY ALTERNATING WITH 88 MCG TABLET (Patient taking differently: Take 100 mcg by mouth daily before breakfast.) 45 tablet 1  . levothyroxine (SYNTHROID) 88 MCG tablet * 45 tablet 1  . LORazepam (ATIVAN) 0.5 MG tablet Take 1 tablet (0.5 mg total) by mouth every 6 (six) hours as needed (Nausea or vomiting). 30 tablet 0  . mirtazapine (REMERON) 7.5 MG tablet Take 1 tablet (7.5 mg total) by mouth at bedtime. 30 tablet 0  . montelukast (SINGULAIR) 10 MG tablet * (Patient taking differently: Take 10 mg by mouth daily.) 90 tablet 1  . Multiple Vitamin (MULTIVITAMIN WITH MINERALS) TABS tablet Take  1 tablet by mouth daily.    Marland Kitchen omega-3 acid ethyl esters (LOVAZA) 1 g capsule TAKE 2 CAPSULES (2 G TOTAL) BY MOUTH 2 (TWO) TIMES DAILY. 360 capsule 0  . ondansetron (ZOFRAN) 8 MG tablet Take 1 tablet (8 mg total) by mouth 2 (two) times daily. Take two times a day starting the day after chemo for 3 days. Then take two times a day as needed for nausea or vomiting. 30 tablet 1  . polyethylene glycol (MIRALAX / GLYCOLAX) 17 g packet Take 17 g by mouth daily. 14 each 0  . prochlorperazine (COMPAZINE) 10 MG tablet Take 1 tablet (10 mg total) by mouth every 6 (six) hours as needed (Nausea or vomiting). 30 tablet 1  . prochlorperazine (COMPAZINE) 25 MG suppository Place 1 suppository (25 mg total) rectally every 12 (twelve) hours as needed for nausea. 12 suppository 3  . senna-docusate (SENOKOT-S)  8.6-50 MG tablet Take 1 tablet by mouth 2 (two) times daily.    . Sodium Chloride-Sodium Bicarb (SODIUM BICARBONATE/SODIUM CHLORIDE) SOLN 1 application by Mouth Rinse route as needed for dry mouth.    . venlafaxine XR (EFFEXOR-XR) 37.5 MG 24 hr capsule Take 1 capsule (37.5 mg total) by mouth daily with breakfast. 30 capsule 1  . Vitamin D, Ergocalciferol, (DRISDOL) 1.25 MG (50000 UT) CAPS capsule TAKE 1 CAPSULE BY MOUTH ONCE WEEKLY (Patient taking differently: Take 50,000 Units by mouth every Friday.) 12 capsule 1      Allergies  Allergen Reactions  . Advair Diskus [Fluticasone-Salmeterol] Other (See Comments)    Hoarseness.  . Neosporin [Neomycin-Polymyxin-Gramicidin] Itching  :   Family History  Problem Relation Age of Onset  . Cancer Mother        BREAST  . Cancer Father        pancreatic  . Cancer Sister        sebacous cell carcinoma  . Cancer Maternal Aunt        lung  . Cancer Paternal Uncle        colon, stomach  . Colon cancer Neg Hx   . Colon polyps Neg Hx   . Esophageal cancer Neg Hx   . Rectal cancer Neg Hx   . Stomach cancer Neg Hx   :   Social History   Socioeconomic History   . Marital status: Married    Spouse name: Not on file  . Number of children: Not on file  . Years of education: Not on file  . Highest education level: Not on file  Occupational History  . Not on file  Tobacco Use  . Smoking status: Former Smoker    Packs/day: 1.00    Years: 15.00    Pack years: 15.00    Types: Cigarettes    Quit date: 12/09/1978    Years since quitting: 42.0  . Smokeless tobacco: Never Used  Vaping Use  . Vaping Use: Never used  Substance and Sexual Activity  . Alcohol use: Yes    Alcohol/week: 7.0 standard drinks    Types: 7 Glasses of wine per week    Comment: SOCIALLY wine  . Drug use: No  . Sexual activity: Yes    Birth control/protection: Surgical  Other Topics Concern  . Not on file  Social History Narrative  . Not on file   Social Determinants of Health   Financial Resource Strain: Not on file  Food Insecurity: Not on file  Transportation Needs: Not on file  Physical Activity: Not on file  Stress: Not on file  Social Connections: Not on file  Intimate Partner Violence: Not At Risk  . Fear of Current or Ex-Partner: No  . Emotionally Abused: No  . Physically Abused: No  . Sexually Abused: No  :  Review of Systems: A comprehensive 14 point review of systems was negative except as noted in the HPI.  Exam: No data found.  General:  well-nourished in no acute distress.   Eyes:  no scleral icterus.   ENT:  There were no oropharyngeal lesions.    Lymphatics:  Negative cervical, supraclavicular or axillary adenopathy.   Respiratory: lungs were clear bilaterally without wheezing or crackles.   Cardiovascular:  Regular rate and rhythm, S1/S2, without murmur, rub or gallop.  There was no pedal edema.   GI:  abdomen was soft, flat, nontender, nondistended, without organomegaly.   Musculoskeletal:  no spinal tenderness of palpation of vertebral spine.  Skin exam was without echymosis, petichae.   Neuro exam was nonfocal. Patient was alert and  oriented.  Attention was good.   Language was appropriate.  Mood was normal without depression.  Speech was not pressured.  Thought content was not tangential.     Lab Results  Component Value Date   WBC 12.5 (H) 12/02/2020   HGB 10.7 (L) 12/02/2020   HCT 33.2 (L) 12/02/2020   PLT 311 12/02/2020   GLUCOSE 107 (H) 12/02/2020   CHOL 94 (L) 12/19/2019   TRIG 54 12/19/2019   HDL 32 (L) 12/19/2019   LDLCALC 49 12/19/2019   ALT 29 12/02/2020   AST 17 12/02/2020   NA 138 12/02/2020   K 4.3 12/02/2020   CL 102 12/02/2020   CREATININE 0.65 12/02/2020   BUN 17 12/02/2020   CO2 25 12/02/2020    ECHOCARDIOGRAM COMPLETE  Result Date: 11/15/2020    ECHOCARDIOGRAM REPORT   Patient Name:   Talmage Date of Exam: 11/15/2020 Medical Rec #:  [id number]           Height:       66.0 in Accession #:    [id number]          Weight:       126.1 lb Date of Birth:  21-Sep-1945           BSA:          1.644 m Patient Age:    49 years            BP:           95/53 mmHg Patient Gender: F                   HR:           91 bpm. Exam Location:  Inpatient Procedure: 2D Echo, Cardiac Doppler and Color Doppler STAT ECHO Indications:    Chemo evaluation  History:        Patient has no prior history of Echocardiogram examinations.                 Breast cancer, Lymphoma.  Sonographer:    Dustin Flock Referring Phys: [place]  1. Left ventricular ejection fraction, by estimation, is 55 to 60%. The left ventricle has normal function. The left ventricle has no regional wall motion abnormalities. Left ventricular diastolic parameters are indeterminate. The average left ventricular global longitudinal strain is -18.9 %.  2. Right ventricular systolic function is normal. The right ventricular size is normal. There is normal pulmonary artery systolic pressure.  3. The mitral valve is normal in structure. Mild mitral valve regurgitation.  4. The aortic valve is normal in structure. Aortic valve  regurgitation is not visualized. FINDINGS  Left Ventricle: Left ventricular ejection fraction, by estimation, is 55 to 60%. The left ventricle has normal function. The left ventricle has no regional wall motion abnormalities. The average left ventricular global longitudinal strain is -18.9 %. The left ventricular internal cavity size was normal in size. There is no left ventricular hypertrophy. Left ventricular diastolic parameters are indeterminate. Right Ventricle: The right ventricular size is normal. No increase in right ventricular wall thickness. Right ventricular systolic function is normal. There is normal pulmonary artery systolic pressure. The tricuspid regurgitant velocity is 2.30 m/s, and  with an assumed right atrial pressure of 3 mmHg, the estimated right ventricular systolic pressure is 75.1 mmHg. Left Atrium: Left atrial size was normal in size.  Right Atrium: Right atrial size was normal in size. Pericardium: There is no evidence of pericardial effusion. Mitral Valve: The mitral valve is normal in structure. Mild mitral valve regurgitation. Tricuspid Valve: The tricuspid valve is grossly normal. Tricuspid valve regurgitation is trivial. Aortic Valve: The aortic valve is normal in structure. Aortic valve regurgitation is not visualized. Pulmonic Valve: The pulmonic valve was not well visualized. Pulmonic valve regurgitation is not visualized. Aorta: The aortic root and ascending aorta are structurally normal, with no evidence of dilitation. IAS/Shunts: The atrial septum is grossly normal.  LEFT VENTRICLE PLAX 2D LVIDd:         4.60 cm  Diastology LVIDs:         3.20 cm  LV e' medial:    7.40 cm/s LV PW:         1.00 cm  LV E/e' medial:  9.8 LV IVS:        0.90 cm  LV e' lateral:   8.59 cm/s LVOT diam:     2.30 cm  LV E/e' lateral: 8.4 LV SV:         116 LV SV Index:   70       2D Longitudinal Strain LVOT Area:     4.15 cm 2D Strain GLS Avg:     -18.9 %  RIGHT VENTRICLE RV Basal diam:  3.20 cm RV S  prime:     10.10 cm/s LEFT ATRIUM             Index       RIGHT ATRIUM           Index LA diam:        2.70 cm 1.64 cm/m  RA Area:     13.20 cm LA Vol (A2C):   46.1 ml 28.04 ml/m RA Volume:   29.40 ml  17.89 ml/m LA Vol (A4C):   41.3 ml 25.12 ml/m LA Biplane Vol: 43.6 ml 26.52 ml/m  AORTIC VALVE LVOT Vmax:   102.00 cm/s LVOT Vmean:  67.100 cm/s LVOT VTI:    0.278 m  AORTA Ao Root diam: 2.80 cm MITRAL VALVE               TRICUSPID VALVE MV Area (PHT): 4.15 cm    TR Peak grad:   21.2 mmHg MV Decel Time: 183 msec    TR Vmax:        230.00 cm/s MV E velocity: 72.40 cm/s MV A velocity: 61.70 cm/s  SHUNTS MV E/A ratio:  1.17        Systemic VTI:  0.28 m                            Systemic Diam: 2.30 cm Mertie Moores MD Electronically signed by Mertie Moores MD Signature Date/Time: 11/15/2020/1:43:19 PM    Final    IR IMAGING GUIDED PORT INSERTION  Result Date: 12/01/2020 CLINICAL DATA:  Left breast carcinoma and diffuse large B-cell lymphoma. The patient requires a porta cath to begin chemotherapy. EXAM: IMPLANTED PORT A CATH PLACEMENT WITH ULTRASOUND AND FLUOROSCOPIC GUIDANCE ANESTHESIA/SEDATION: 2.0 mg IV Versed; 100 mcg IV Fentanyl Total Moderate Sedation Time:  34 minutes The patient's level of consciousness and physiologic status were continuously monitored during the procedure by Radiology nursing. FLUOROSCOPY TIME:  24 seconds.  3.0 mGy. PROCEDURE: The procedure, risks, benefits, and alternatives were explained to the patient. Questions regarding the procedure were encouraged and answered. The patient understands  and consents to the procedure. A time-out was performed prior to initiating the procedure. Ultrasound was utilized to confirm patency of the right internal jugular vein. The right neck and chest were prepped with chlorhexidine in a sterile fashion, and a sterile drape was applied covering the operative field. Maximum barrier sterile technique with sterile gowns and gloves were used for the  procedure. Local anesthesia was provided with 1% lidocaine. After creating a small venotomy incision, a 21 gauge needle was advanced into the right internal jugular vein under direct, real-time ultrasound guidance. Ultrasound image documentation was performed. After securing guidewire access, an 8 Fr dilator was placed. A J-wire was kinked to measure appropriate catheter length. A subcutaneous port pocket was then created along the upper chest wall utilizing sharp and blunt dissection. Portable cautery was utilized. The pocket was irrigated with sterile saline. A single lumen power injectable port was chosen for placement. The 8 Fr catheter was tunneled from the port pocket site to the venotomy incision. The port was placed in the pocket. External catheter was trimmed to appropriate length based on guidewire measurement. At the venotomy, an 8 Fr peel-away sheath was placed over a guidewire. The catheter was then placed through the sheath and the sheath removed. Final catheter positioning was confirmed and documented with a fluoroscopic spot image. The port was accessed with a needle and aspirated and flushed with heparinized saline. The access needle was removed. The venotomy and port pocket incisions were closed with subcutaneous 3-0 Monocryl and subcuticular 4-0 Vicryl. Dermabond was applied to both incisions. COMPLICATIONS: COMPLICATIONS None FINDINGS: After catheter placement, the tip lies at the cavo-atrial junction. The catheter aspirates normally and is ready for immediate use. IMPRESSION: Placement of single lumen port a cath via right internal jugular vein. The catheter tip lies at the cavo-atrial junction. A power injectable port a cath was placed and is ready for immediate use. Electronically Signed   By: Aletta Edouard M.D.   On: 12/01/2020 11:20   [place] EKG SITE RITE  Result Date: 11/15/2020 If Site Rite image not attached, placement could not be confirmed due to current cardiac  rhythm.    ECHOCARDIOGRAM COMPLETE  Result Date: 11/15/2020    ECHOCARDIOGRAM REPORT   Patient Name:   Lindsey Price Date of Exam: 11/15/2020 Medical Rec #:  [id number]           Height:       66.0 in Accession #:    [id number]          Weight:       126.1 lb Date of Birth:  08/20/1946           BSA:          1.644 m Patient Age:    50 years            BP:           95/53 mmHg Patient Gender: F                   HR:           91 bpm. Exam Location:  Inpatient Procedure: 2D Echo, Cardiac Doppler and Color Doppler STAT ECHO Indications:    Chemo evaluation  History:        Patient has no prior history of Echocardiogram examinations.                 Breast cancer, Lymphoma.  Sonographer:    Dustin Flock Referring Phys: [id number]  KRISTIN R CURCIO IMPRESSIONS  1. Left ventricular ejection fraction, by estimation, is 55 to 60%. The left ventricle has normal function. The left ventricle has no regional wall motion abnormalities. Left ventricular diastolic parameters are indeterminate. The average left ventricular global longitudinal strain is -18.9 %.  2. Right ventricular systolic function is normal. The right ventricular size is normal. There is normal pulmonary artery systolic pressure.  3. The mitral valve is normal in structure. Mild mitral valve regurgitation.  4. The aortic valve is normal in structure. Aortic valve regurgitation is not visualized. FINDINGS  Left Ventricle: Left ventricular ejection fraction, by estimation, is 55 to 60%. The left ventricle has normal function. The left ventricle has no regional wall motion abnormalities. The average left ventricular global longitudinal strain is -18.9 %. The left ventricular internal cavity size was normal in size. There is no left ventricular hypertrophy. Left ventricular diastolic parameters are indeterminate. Right Ventricle: The right ventricular size is normal. No increase in right ventricular wall thickness. Right ventricular systolic function is  normal. There is normal pulmonary artery systolic pressure. The tricuspid regurgitant velocity is 2.30 m/s, and  with an assumed right atrial pressure of 3 mmHg, the estimated right ventricular systolic pressure is 87.5 mmHg. Left Atrium: Left atrial size was normal in size. Right Atrium: Right atrial size was normal in size. Pericardium: There is no evidence of pericardial effusion. Mitral Valve: The mitral valve is normal in structure. Mild mitral valve regurgitation. Tricuspid Valve: The tricuspid valve is grossly normal. Tricuspid valve regurgitation is trivial. Aortic Valve: The aortic valve is normal in structure. Aortic valve regurgitation is not visualized. Pulmonic Valve: The pulmonic valve was not well visualized. Pulmonic valve regurgitation is not visualized. Aorta: The aortic root and ascending aorta are structurally normal, with no evidence of dilitation. IAS/Shunts: The atrial septum is grossly normal.  LEFT VENTRICLE PLAX 2D LVIDd:         4.60 cm  Diastology LVIDs:         3.20 cm  LV e' medial:    7.40 cm/s LV PW:         1.00 cm  LV E/e' medial:  9.8 LV IVS:        0.90 cm  LV e' lateral:   8.59 cm/s LVOT diam:     2.30 cm  LV E/e' lateral: 8.4 LV SV:         116 LV SV Index:   70       2D Longitudinal Strain LVOT Area:     4.15 cm 2D Strain GLS Avg:     -18.9 %  RIGHT VENTRICLE RV Basal diam:  3.20 cm RV S prime:     10.10 cm/s LEFT ATRIUM             Index       RIGHT ATRIUM           Index LA diam:        2.70 cm 1.64 cm/m  RA Area:     13.20 cm LA Vol (A2C):   46.1 ml 28.04 ml/m RA Volume:   29.40 ml  17.89 ml/m LA Vol (A4C):   41.3 ml 25.12 ml/m LA Biplane Vol: 43.6 ml 26.52 ml/m  AORTIC VALVE LVOT Vmax:   102.00 cm/s LVOT Vmean:  67.100 cm/s LVOT VTI:    0.278 m  AORTA Ao Root diam: 2.80 cm MITRAL VALVE  TRICUSPID VALVE MV Area (PHT): 4.15 cm    TR Peak grad:   21.2 mmHg MV Decel Time: 183 msec    TR Vmax:        230.00 cm/s MV E velocity: 72.40 cm/s MV A velocity: 61.70  cm/s  SHUNTS MV E/A ratio:  1.17        Systemic VTI:  0.28 m                            Systemic Diam: 2.30 cm Mertie Moores MD Electronically signed by Mertie Moores MD Signature Date/Time: 11/15/2020/1:43:19 PM    Final    IR IMAGING GUIDED PORT INSERTION  Result Date: 12/01/2020 CLINICAL DATA:  Left breast carcinoma and diffuse large B-cell lymphoma. The patient requires a porta cath to begin chemotherapy. EXAM: IMPLANTED PORT A CATH PLACEMENT WITH ULTRASOUND AND FLUOROSCOPIC GUIDANCE ANESTHESIA/SEDATION: 2.0 mg IV Versed; 100 mcg IV Fentanyl Total Moderate Sedation Time:  34 minutes The patient's level of consciousness and physiologic status were continuously monitored during the procedure by Radiology nursing. FLUOROSCOPY TIME:  24 seconds.  3.0 mGy. PROCEDURE: The procedure, risks, benefits, and alternatives were explained to the patient. Questions regarding the procedure were encouraged and answered. The patient understands and consents to the procedure. A time-out was performed prior to initiating the procedure. Ultrasound was utilized to confirm patency of the right internal jugular vein. The right neck and chest were prepped with chlorhexidine in a sterile fashion, and a sterile drape was applied covering the operative field. Maximum barrier sterile technique with sterile gowns and gloves were used for the procedure. Local anesthesia was provided with 1% lidocaine. After creating a small venotomy incision, a 21 gauge needle was advanced into the right internal jugular vein under direct, real-time ultrasound guidance. Ultrasound image documentation was performed. After securing guidewire access, an 8 Fr dilator was placed. A J-wire was kinked to measure appropriate catheter length. A subcutaneous port pocket was then created along the upper chest wall utilizing sharp and blunt dissection. Portable cautery was utilized. The pocket was irrigated with sterile saline. A single lumen power injectable port  was chosen for placement. The 8 Fr catheter was tunneled from the port pocket site to the venotomy incision. The port was placed in the pocket. External catheter was trimmed to appropriate length based on guidewire measurement. At the venotomy, an 8 Fr peel-away sheath was placed over a guidewire. The catheter was then placed through the sheath and the sheath removed. Final catheter positioning was confirmed and documented with a fluoroscopic spot image. The port was accessed with a needle and aspirated and flushed with heparinized saline. The access needle was removed. The venotomy and port pocket incisions were closed with subcutaneous 3-0 Monocryl and subcuticular 4-0 Vicryl. Dermabond was applied to both incisions. COMPLICATIONS: COMPLICATIONS None FINDINGS: After catheter placement, the tip lies at the cavo-atrial junction. The catheter aspirates normally and is ready for immediate use. IMPRESSION: Placement of single lumen port a cath via right internal jugular vein. The catheter tip lies at the cavo-atrial junction. A power injectable port a cath was placed and is ready for immediate use. Electronically Signed   By: Aletta Edouard M.D.   On: 12/01/2020 11:20   [place] EKG SITE RITE  Result Date: 11/15/2020 If Site Rite image not attached, placement could not be confirmed due to current cardiac rhythm.   Pathology:  Diagnosis Colon, biopsy - INVOLVEMENT BY LARGE B-CELL LYMPHOMA,  SEE COMMENT. Microscopic Comment Fragments of colonic mucosa reveal effacement by a dense lymphoid infiltrate. There is crush artifact, but intact areas reveal sheets of large lymphoid cells with irregular nuclear contours and prominent nucleoli. The cells are positive for CD45, CD20, bcl-6, bcl-2, CD21, and MUM-1. They are negative for CD10, CD30, and EBV in situ hybridization. Ki-67 is elevated. CD3 and CD5 highlight scattered T-cells. The overall findings are consistent with involvement by a large B-cell lymphoma. The  differential includes a diffuse large B-cell lymphoma, activated B-cell type or an aggressive B-cell lymphoma (double hit lymphoma). FISH for MYC, BCL-2, and BCL-6 will be attempted and reported in an addendum. Dr. Silverio Decamp was notified on 11/10/2020. Vicente Males MD Pathologist, Electronic Signature (Case signed 11/10/2020)  Assessment and Plan:   1) Large B-cell lymphoma -CT abdomen/pelvis with contrast 10/21/2020 - "Acute colitis/enteritis of the cecum/ascending colon and the associated terminal loops of ileum. As was recently described on CT imaging, the anatomy of distal ileum, ileocecal valve, cecum is atypical and correlation with patient's prior surgical record would be useful. The acute inflammatory changes contribute to at least a partial small bowel obstruction. Primary concern of this inflammatory cystic mass/bowel of the right lower quadrant is malignancy given that the posterior margin is inseparable from the psoas muscle, adnexa, and the right ureter. Correlation with CEA may be useful. Inflammatory/infectious changes are also a consideration." -Colonoscopy performed 11/04/2020- "An infiltrative and ulcerated partially obstructing large mass was found at 85 cm proximal to the anus. 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  Uric acid and LDH are normal.  Labs are adequate to proceed with day 1 of cycle #2 of her chemotherapy today as planned.  We will check a daily CBC with  differential and CMET. -Continue as needed antiemetics. -MiraLAX and Senokot ordered for constipation. -Lovenox for DVT prophylaxis. -Continue home medications.  Mikey Bussing, DNP, AGPCNP-BC, AOCNP  ADDENDUM  .Patient was Personally and independently interviewed, examined and relevant elements of the history of present illness were reviewed in details and an assessment and plan was created. All elements of the patient's history of present illness , assessment and plan were discussed in details with Mikey Bussing, DNP, AGPCNP-BC, AOCNP. The above documentation reflects our combined findings assessment and plan.  . CBC Latest Ref Rng & Units 12/06/2020 12/02/2020 12/01/2020  WBC 4.0 - 10.5 K/uL 8.1 12.5(H) 14.8(H)  Hemoglobin 12.0 - 15.0 g/dL 10.7(L) 10.7(L) 11.2(L)  Hematocrit 36.0 - 46.0 % 34.3(L) 33.2(L) 35.2(L)  Platelets 150 - 400 K/uL 493(H) 311 263    . CMP Latest Ref Rng & Units 12/06/2020 12/02/2020 11/26/2020  Glucose 70 - 99 mg/dL 84 107(H) 94  BUN 8 - 23 mg/dL $Remove'22 17 17  'XyRbyqv$ Creatinine 0.44 - 1.00 mg/dL 0.53 0.65 0.66  Sodium 135 - 145 mmol/L 136 138 135  Potassium 3.5 - 5.1 mmol/L 3.8 4.3 3.6  Chloride 98 - 111 mmol/L 105 102 102  CO2 22 - 32 mmol/L $RemoveB'25 25 25  'JXGsfHCY$ Calcium 8.9 - 10.3 mg/dL  8.2(L) 8.3(L) 8.1(L)  Total Protein 6.5 - 8.1 g/dL 5.9(L) 5.8(L) 5.4(L)  Total Bilirubin 0.3 - 1.2 mg/dL 0.7 0.3 0.7  Alkaline Phos 38 - 126 U/L 109 135(H) 73  AST 15 - 41 U/L 14(L) 17 7(L)  ALT 0 - 44 U/L $Remo'22 29 20   'uTJmE$ PLan - okay to proceed with standard dose EPOCH-R with similar dose as C1. -outpatient Rituxan and G-CSF on 4/11 -no other prohibitive rx toxicities. In good spirits. -outpatient PET/CT scheduled for 4/15  Sullivan Lone MD MS

## 2020-12-06 NOTE — Progress Notes (Signed)
Per Dr. Irene Limbo, pt will be on Prednisone 60 mg PO daily w/ this cycle of chemo.  Kennith Center, Pharm.D., CPP 12/06/2020@12 :50 PM

## 2020-12-06 NOTE — Telephone Encounter (Signed)
Scheduled follow-up appointment per 3/31 los. Patient is aware.

## 2020-12-07 LAB — COMPREHENSIVE METABOLIC PANEL
ALT: 19 U/L (ref 0–44)
AST: 13 U/L — ABNORMAL LOW (ref 15–41)
Albumin: 2.7 g/dL — ABNORMAL LOW (ref 3.5–5.0)
Alkaline Phosphatase: 97 U/L (ref 38–126)
Anion gap: 6 (ref 5–15)
BUN: 20 mg/dL (ref 8–23)
CO2: 25 mmol/L (ref 22–32)
Calcium: 8.7 mg/dL — ABNORMAL LOW (ref 8.9–10.3)
Chloride: 107 mmol/L (ref 98–111)
Creatinine, Ser: 0.56 mg/dL (ref 0.44–1.00)
GFR, Estimated: >60 mL/min (ref >60)
Glucose, Bld: 133 mg/dL — ABNORMAL HIGH (ref 70–99)
Potassium: 4 mmol/L (ref 3.5–5.1)
Sodium: 138 mmol/L (ref 135–145)
Total Bilirubin: 0.5 mg/dL (ref 0.3–1.2)
Total Protein: 5.8 g/dL — ABNORMAL LOW (ref 6.5–8.1)

## 2020-12-07 LAB — CBC WITH DIFFERENTIAL/PLATELET
Abs Immature Granulocytes: 0.33 10*3/uL — ABNORMAL HIGH (ref 0.00–0.07)
Basophils Absolute: 0 10*3/uL (ref 0.0–0.1)
Basophils Relative: 0 %
Eosinophils Absolute: 0 10*3/uL (ref 0.0–0.5)
Eosinophils Relative: 0 %
HCT: 32.3 % — ABNORMAL LOW (ref 36.0–46.0)
Hemoglobin: 10.3 g/dL — ABNORMAL LOW (ref 12.0–15.0)
Immature Granulocytes: 4 %
Lymphocytes Relative: 9 %
Lymphs Abs: 0.7 10*3/uL (ref 0.7–4.0)
MCH: 26.9 pg (ref 26.0–34.0)
MCHC: 31.9 g/dL (ref 30.0–36.0)
MCV: 84.3 fL (ref 80.0–100.0)
Monocytes Absolute: 0.5 10*3/uL (ref 0.1–1.0)
Monocytes Relative: 6 %
Neutro Abs: 6.2 10*3/uL (ref 1.7–7.7)
Neutrophils Relative %: 81 %
Platelets: 490 10*3/uL — ABNORMAL HIGH (ref 150–400)
RBC: 3.83 MIL/uL — ABNORMAL LOW (ref 3.87–5.11)
RDW: 17.2 % — ABNORMAL HIGH (ref 11.5–15.5)
WBC: 7.7 10*3/uL (ref 4.0–10.5)
nRBC: 0 % (ref 0.0–0.2)

## 2020-12-07 MED ORDER — SODIUM CHLORIDE 0.9 % IV SOLN
Freq: Once | INTRAVENOUS | Status: AC
  Administered 2020-12-07: 8 mg via INTRAVENOUS
  Filled 2020-12-07: qty 4

## 2020-12-07 MED ORDER — ETOPOSIDE CHEMO INJECTION 500 MG/25ML
Freq: Once | INTRAVENOUS | Status: AC
  Filled 2020-12-07: qty 8

## 2020-12-07 NOTE — Progress Notes (Signed)
Chaplain engaged in an initial visit with Lindsey Price.  Chaplain learned of Lindsey Price's faith and healthcare journey.  Chaplain and Lindsey Price prayed together.    Chaplain will follow-up.    12/07/20 1200  Clinical Encounter Type  Visited With Patient  Visit Type Initial;Spiritual support

## 2020-12-07 NOTE — Progress Notes (Addendum)
HEMATOLOGY-ONCOLOGY PROGRESS NOTE  SUBJECTIVE: The patient tolerated day 1 of cycle #2 of her chemotherapy well overall.  She has no complaints this morning.  She denies mucositis, nausea, vomiting.  No constipation reported this morning.   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 Biopsy showed invasive and in situ ductal carcinoma, HER-2 equivocal by IHC (2+), negative by FISH (ratio 1.59), ER+ >95%, PR+ 85%, Ki67 10%.    Malignant neoplasm of left breast in female, estrogen receptor positive ([place])  10/12/2020 Initial Diagnosis   Malignant neoplasm of left breast in female, estrogen receptor positive ([place])   10/12/2020 Cancer Staging   Staging form: Breast, AJCC 8th Edition - Clinical stage from 10/12/2020: Stage IA (cT1b, cN0, cM0, G2, ER+, PR+, HER2-) - Signed by Nicholas Lose, MD on 10/15/2020 Stage prefix: Initial diagnosis   11/05/2020 Surgery   Left lumpectomy: Grade 1 IDC, 1.1 cm, intermediate grade DCIS, margins are negative, lymph node -0/1, ER greater than 95%, PR 85%, HER-2 negative, Ki-67 10%   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[place])  11/11/2020 Initial Diagnosis   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[place])   11/15/2020 -  Chemotherapy    Patient is on Treatment Plan: IP NON-HODGKINS LYMPHOMA EPOCH Q21D   Patient is on Antibody Plan: NON-HODGKINS LYMPHOMA RITUXIMAB Q21D    11/19/2020 -  Chemotherapy    Patient is on Treatment Plan: IP NON-HODGKINS LYMPHOMA EPOCH Q21D   Patient is on Antibody Plan: NON-HODGKINS LYMPHOMA RITUXIMAB Q21D       REVIEW OF SYSTEMS:    Constitutional: Denies fevers, chills  Eyes: Denies blurriness of vision Ears, nose, mouth, throat, and face: Denies mucositis or sore throat Respiratory: Denies cough, dyspnea or wheezes Cardiovascular: Denies palpitation, chest discomfort Gastrointestinal:  Denies nausea, heartburn or change in bowel habits Skin: Denies abnormal skin rashes Lymphatics: Denies new lymphadenopathy or easy bruising Neurological: Denies dizziness and headache Behavioral/Psych: Mood is stable, no new changes  Extremities: No lower extremity edema All other systems were reviewed with the patient and are negative.  I have reviewed the past medical history, past surgical history, social history and family history with the patient and they are unchanged from previous note.   PHYSICAL EXAMINATION: ECOG PERFORMANCE STATUS: 1 - Symptomatic but completely ambulatory  Vitals:   12/06/20 2208 12/07/20 0509  BP: 138/78 127/66  Pulse: 79 76  Resp: 16 16  Temp: (!) 97.5 F (36.4 C) 98.5 F (36.9 C)  SpO2: 94% 93%   Filed Weights   12/06/20 1520  Weight: 58.8 kg    Intake/Output from previous day: 04/04 0701 - 04/05 0700 In: 1093.1 [P.O.:240; I.V.:220.1; IV Piggyback:633] Out: -   GENERAL:alert, no distress and comfortable SKIN: skin color, texture, turgor are normal, no rashes or significant lesions EYES: normal, Conjunctiva are pink and non-injected, sclera clear OROPHARYNX:no exudate, no erythema and lips, buccal mucosa, and tongue normal  LUNGS: clear to auscultation and percussion with normal breathing effort HEART: regular rate & rhythm and no murmurs and no lower extremity edema ABDOMEN:abdomen soft, non-tender and normal bowel sounds Musculoskeletal:no cyanosis  of digits and no clubbing  NEURO: alert & oriented x 3 with fluent speech, no focal motor/sensory deficits  LABORATORY DATA:  I have reviewed the data as listed CMP Latest Ref Rng & Units 12/07/2020 12/06/2020 12/02/2020  Glucose 70 -  99 mg/dL 133(H) 84 107(H)  BUN 8 - 23 mg/dL _0 Creatinine 0.44 - 1.00 mg/dL 0.56 0.53 0.65  Sodium 135 - 145 mmol/L 138 136 138  Potassium 3.5 - 5.1 mmol/L 4.0 3.8 4.3  Chloride 98 - 111 mmol/L 107 105 102  CO2 22 - 32 mmol/L _1 Calcium 8.9 - 10.3 mg/dL 8.7(L) 8.2(L) 8.3(L)  Total Protein 6.5 - 8.1 g/dL 5.8(L) 5.9(L) 5.8(L)  Total Bilirubin 0.3 - 1.2 mg/dL 0.5 0.7 0.3  Alkaline Phos 38 - 126 U/L 97 109 135(H)  AST 15 - 41 U/L 13(L) 14(L) 17  ALT 0 - 44 U/L _2 Lab Results  Component Value Date   WBC 7.7 12/07/2020   HGB 10.3 (L) 12/07/2020   HCT 32.3 (L) 12/07/2020   MCV 84.3 12/07/2020   PLT 490 (H) 12/07/2020   NEUTROABS 6.2 12/07/2020    ECHOCARDIOGRAM COMPLETE  Result Date: 11/15/2020    ECHOCARDIOGRAM REPORT   Patient Name:   [place] Date of Exam: 11/15/2020 Medical Rec #:  [id number]           Height:       66.0 in Accession #:    [id number]          Weight:       126.1 lb Date of Birth:  1946-06-19           BSA:          1.644 m Patient Age:    75 years            BP:           95/53 mmHg Patient Gender: F                   HR:           91 bpm. Exam Location:  Inpatient Procedure: 2D Echo, Cardiac Doppler and Color Doppler STAT ECHO Indications:    Chemo evaluation  History:        Patient has no prior history of Echocardiogram examinations.                 Breast cancer, Lymphoma.  Sonographer:    Dustin Flock Referring Phys: [place]  1. Left ventricular ejection fraction, by estimation, is 55 to 60%. The left ventricle has normal function. The left ventricle has no regional wall motion abnormalities. Left ventricular diastolic parameters are indeterminate. The average left ventricular global longitudinal strain is -18.9 %.  2. Right ventricular systolic function is normal. The right ventricular size is normal. There is normal pulmonary artery systolic pressure.  3. The mitral valve is normal in structure. Mild mitral  valve regurgitation.  4. The aortic valve is normal in structure. Aortic valve regurgitation is not visualized. FINDINGS  Left Ventricle: Left ventricular ejection fraction, by estimation, is 55 to 60%. The left ventricle has normal function. The left ventricle has no regional wall motion abnormalities. The average left ventricular global longitudinal strain is -18.9 %. The left ventricular internal cavity size was normal in size. There is no left ventricular hypertrophy. Left ventricular diastolic parameters are indeterminate. Right Ventricle: The right ventricular size is  normal. No increase in right ventricular wall thickness. Right ventricular systolic function is normal. There is normal pulmonary artery systolic pressure. The tricuspid regurgitant velocity is 2.30 m/s, and  with an assumed right atrial pressure of 3 mmHg, the estimated right ventricular systolic pressure is 96.2 mmHg. Left Atrium: Left atrial size was normal in size. Right Atrium: Right atrial size was normal in size. Pericardium: There is no evidence of pericardial effusion. Mitral Valve: The mitral valve is normal in structure. Mild mitral valve regurgitation. Tricuspid Valve: The tricuspid valve is grossly normal. Tricuspid valve regurgitation is trivial. Aortic Valve: The aortic valve is normal in structure. Aortic valve regurgitation is not visualized. Pulmonic Valve: The pulmonic valve was not well visualized. Pulmonic valve regurgitation is not visualized. Aorta: The aortic root and ascending aorta are structurally normal, with no evidence of dilitation. IAS/Shunts: The atrial septum is grossly normal.  LEFT VENTRICLE PLAX 2D LVIDd:         4.60 cm  Diastology LVIDs:         3.20 cm  LV e' medial:    7.40 cm/s LV PW:         1.00 cm  LV E/e' medial:  9.8 LV IVS:        0.90 cm  LV e' lateral:   8.59 cm/s LVOT diam:     2.30 cm  LV E/e' lateral: 8.4 LV SV:         116 LV SV Index:   70       2D Longitudinal Strain LVOT Area:     4.15 cm  2D Strain GLS Avg:     -18.9 %  RIGHT VENTRICLE RV Basal diam:  3.20 cm RV S prime:     10.10 cm/s LEFT ATRIUM             Index       RIGHT ATRIUM           Index LA diam:        2.70 cm 1.64 cm/m  RA Area:     13.20 cm LA Vol (A2C):   46.1 ml 28.04 ml/m RA Volume:   29.40 ml  17.89 ml/m LA Vol (A4C):   41.3 ml 25.12 ml/m LA Biplane Vol: 43.6 ml 26.52 ml/m  AORTIC VALVE LVOT Vmax:   102.00 cm/s LVOT Vmean:  67.100 cm/s LVOT VTI:    0.278 m  AORTA Ao Root diam: 2.80 cm MITRAL VALVE               TRICUSPID VALVE MV Area (PHT): 4.15 cm    TR Peak grad:   21.2 mmHg MV Decel Time: 183 msec    TR Vmax:        230.00 cm/s MV E velocity: 72.40 cm/s MV A velocity: 61.70 cm/s  SHUNTS MV E/A ratio:  1.17        Systemic VTI:  0.28 m                            Systemic Diam: 2.30 cm Mertie Moores MD Electronically signed by Mertie Moores MD Signature Date/Time: 11/15/2020/1:43:19 PM    Final    IR IMAGING GUIDED PORT INSERTION  Result Date: 12/01/2020 CLINICAL DATA:  Left breast carcinoma and diffuse large B-cell lymphoma. The patient requires a porta cath to begin chemotherapy. EXAM: IMPLANTED PORT A CATH PLACEMENT WITH ULTRASOUND AND FLUOROSCOPIC GUIDANCE ANESTHESIA/SEDATION: 2.0 mg IV Versed; 100 mcg  IV Fentanyl Total Moderate Sedation Time:  34 minutes The patient's level of consciousness and physiologic status were continuously monitored during the procedure by Radiology nursing. FLUOROSCOPY TIME:  24 seconds.  3.0 mGy. PROCEDURE: The procedure, risks, benefits, and alternatives were explained to the patient. Questions regarding the procedure were encouraged and answered. The patient understands and consents to the procedure. A time-out was performed prior to initiating the procedure. Ultrasound was utilized to confirm patency of the right internal jugular vein. The right neck and chest were prepped with chlorhexidine in a sterile fashion, and a sterile drape was applied covering the operative field. Maximum  barrier sterile technique with sterile gowns and gloves were used for the procedure. Local anesthesia was provided with 1% lidocaine. After creating a small venotomy incision, a 21 gauge needle was advanced into the right internal jugular vein under direct, real-time ultrasound guidance. Ultrasound image documentation was performed. After securing guidewire access, an 8 Fr dilator was placed. A J-wire was kinked to measure appropriate catheter length. A subcutaneous port pocket was then created along the upper chest wall utilizing sharp and blunt dissection. Portable cautery was utilized. The pocket was irrigated with sterile saline. A single lumen power injectable port was chosen for placement. The 8 Fr catheter was tunneled from the port pocket site to the venotomy incision. The port was placed in the pocket. External catheter was trimmed to appropriate length based on guidewire measurement. At the venotomy, an 8 Fr peel-away sheath was placed over a guidewire. The catheter was then placed through the sheath and the sheath removed. Final catheter positioning was confirmed and documented with a fluoroscopic spot image. The port was accessed with a needle and aspirated and flushed with heparinized saline. The access needle was removed. The venotomy and port pocket incisions were closed with subcutaneous 3-0 Monocryl and subcuticular 4-0 Vicryl. Dermabond was applied to both incisions. COMPLICATIONS: COMPLICATIONS None FINDINGS: After catheter placement, the tip lies at the cavo-atrial junction. The catheter aspirates normally and is ready for immediate use. IMPRESSION: Placement of single lumen port a cath via right internal jugular vein. The catheter tip lies at the cavo-atrial junction. A power injectable port a cath was placed and is ready for immediate use. Electronically Signed   By: Aletta Edouard M.D.   On: 12/01/2020 11:20   [place] EKG SITE RITE  Result Date: 11/15/2020 If Site Rite image not attached,  placement could not be confirmed due to current cardiac rhythm.   ASSESSMENT AND PLAN: 1) large B-cell lymphoma -CT abdomen/pelvis with contrast 10/21/2020 - "Acute colitis/enteritis of the cecum/ascending colon and the associated terminal loops of ileum. As was recently described on CT imaging, the anatomy of distal ileum, ileocecal valve, cecum is atypical and correlation with patient's prior surgical record would be useful. The acute inflammatory changes contribute to at least a partial small bowel obstruction. Primary concern of this inflammatory cystic mass/bowel of the right lower quadrant is malignancy given that the posterior margin is inseparable from the psoas muscle, adnexa, and the right ureter. Correlation with CEA may be useful. Inflammatory/infectious changes are also a consideration." -Colonoscopy performed 11/04/2020- "An infiltrative and ulcerated partially obstructing large mass was found at 85 cm proximal to the anus. 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 -Plan is for adjuvant radiation therapy followed by adjuvant antiestrogen therapy  3)  iron deficiency anemia -Labs from 11/15/2020-ferritin 34, iron 24,  percent saturation 8%, TIBC 308 -Labs from 11/16/2020-vitamin B12 level 241, folate 7.4  4) history of right breast cancer diagnosed in 2011 status post lumpectomy and 5 years of antiestrogen therapy  5) depression/anxiety  6) hyperlipidemia  7) hypertension  8) hypothyroidism  9) allergies  PLAN:  -Labs from 12/07/2020 have been reviewed and are within normal limits except for RBC 3.83, hemoglobin 10.3, hematocrit 32.3, RDW 17.2, platelets 490,000, glucose 133, calcium 8.7, total protein 5.8, albumin 2.7, AST 13.  Labs are adequate to proceed with day 2 of her treatment today. -Will monitor daily CBC with differential and CMET. -Continue as needed  antiemetics. -Continue MiraLAX and Senokot-S. -Lovenox for DVT prophylaxis. -Continue home medications. -Continue PPI and as needed Maalox for reflux symptoms. -She is scheduled on 12/13/2020 for rituximab and Neulasta and will have labs and follow-up visit on 12/16/2020.   LOS: 1 day   Mikey Bussing, DNP, AGPCNP-BC, AOCNP 12/07/20   ADDENDUM  .Patient was Personally and independently interviewed, examined and relevant elements of the history of present illness were reviewed in details and an assessment and plan was created. All elements of the patient's history of present illness , assessment and plan were discussed in details with Mikey Bussing, DNP, AGPCNP-BC, AOCNP. The above documentation reflects our combined findings assessment and plan.  Sullivan Lone MD MS

## 2020-12-08 DIAGNOSIS — C8510 Unspecified B-cell lymphoma, unspecified site: Secondary | ICD-10-CM | POA: Diagnosis not present

## 2020-12-08 DIAGNOSIS — Z5111 Encounter for antineoplastic chemotherapy: Secondary | ICD-10-CM | POA: Diagnosis not present

## 2020-12-08 LAB — CBC WITH DIFFERENTIAL/PLATELET
Abs Immature Granulocytes: 0.14 10*3/uL — ABNORMAL HIGH (ref 0.00–0.07)
Basophils Absolute: 0 10*3/uL (ref 0.0–0.1)
Basophils Relative: 0 %
Eosinophils Absolute: 0 10*3/uL (ref 0.0–0.5)
Eosinophils Relative: 0 %
HCT: 31.1 % — ABNORMAL LOW (ref 36.0–46.0)
Hemoglobin: 9.6 g/dL — ABNORMAL LOW (ref 12.0–15.0)
Immature Granulocytes: 2 %
Lymphocytes Relative: 11 %
Lymphs Abs: 0.9 10*3/uL (ref 0.7–4.0)
MCH: 26.3 pg (ref 26.0–34.0)
MCHC: 30.9 g/dL (ref 30.0–36.0)
MCV: 85.2 fL (ref 80.0–100.0)
Monocytes Absolute: 0.9 10*3/uL (ref 0.1–1.0)
Monocytes Relative: 11 %
Neutro Abs: 6.3 10*3/uL (ref 1.7–7.7)
Neutrophils Relative %: 76 %
Platelets: 477 10*3/uL — ABNORMAL HIGH (ref 150–400)
RBC: 3.65 MIL/uL — ABNORMAL LOW (ref 3.87–5.11)
RDW: 17.4 % — ABNORMAL HIGH (ref 11.5–15.5)
WBC: 8.3 10*3/uL (ref 4.0–10.5)
nRBC: 0 % (ref 0.0–0.2)

## 2020-12-08 LAB — COMPREHENSIVE METABOLIC PANEL
ALT: 20 U/L (ref 0–44)
AST: 16 U/L (ref 15–41)
Albumin: 2.8 g/dL — ABNORMAL LOW (ref 3.5–5.0)
Alkaline Phosphatase: 86 U/L (ref 38–126)
Anion gap: 5 (ref 5–15)
BUN: 22 mg/dL (ref 8–23)
CO2: 25 mmol/L (ref 22–32)
Calcium: 8.7 mg/dL — ABNORMAL LOW (ref 8.9–10.3)
Chloride: 113 mmol/L — ABNORMAL HIGH (ref 98–111)
Creatinine, Ser: 0.69 mg/dL (ref 0.44–1.00)
GFR, Estimated: >60 mL/min (ref >60)
Glucose, Bld: 109 mg/dL — ABNORMAL HIGH (ref 70–99)
Potassium: 3.9 mmol/L (ref 3.5–5.1)
Sodium: 143 mmol/L (ref 135–145)
Total Bilirubin: 0.7 mg/dL (ref 0.3–1.2)
Total Protein: 5.4 g/dL — ABNORMAL LOW (ref 6.5–8.1)

## 2020-12-08 MED ORDER — ETOPOSIDE CHEMO INJECTION 500 MG/25ML
Freq: Once | INTRAVENOUS | Status: AC
  Filled 2020-12-08: qty 8

## 2020-12-08 MED ORDER — SODIUM CHLORIDE 0.9 % IV SOLN
Freq: Once | INTRAVENOUS | Status: AC
  Administered 2020-12-08: 8 mg via INTRAVENOUS
  Filled 2020-12-08: qty 4

## 2020-12-08 NOTE — Care Management Important Message (Signed)
Important Message  Patient Details IM Letter given to the Patient. Name: Lindsey Price MRN: [id number] Date of Birth: [birth year]   Medicare Important Message Given:  Yes     Kerin Salen 12/08/2020, 4:14 PM

## 2020-12-08 NOTE — Progress Notes (Signed)
This RN assumed care of patient at 2200. I agree with charted assessment for the shift. Continue to monitor. Hortencia Conradi RN

## 2020-12-09 ENCOUNTER — Encounter (HOSPITAL_COMMUNITY): Payer: PPO

## 2020-12-09 DIAGNOSIS — C8510 Unspecified B-cell lymphoma, unspecified site: Secondary | ICD-10-CM | POA: Diagnosis not present

## 2020-12-09 DIAGNOSIS — Z5111 Encounter for antineoplastic chemotherapy: Secondary | ICD-10-CM | POA: Diagnosis not present

## 2020-12-09 LAB — COMPREHENSIVE METABOLIC PANEL
ALT: 31 U/L (ref 0–44)
AST: 20 U/L (ref 15–41)
Albumin: 2.7 g/dL — ABNORMAL LOW (ref 3.5–5.0)
Alkaline Phosphatase: 79 U/L (ref 38–126)
Anion gap: 5 (ref 5–15)
BUN: 26 mg/dL — ABNORMAL HIGH (ref 8–23)
CO2: 26 mmol/L (ref 22–32)
Calcium: 8.6 mg/dL — ABNORMAL LOW (ref 8.9–10.3)
Chloride: 110 mmol/L (ref 98–111)
Creatinine, Ser: 0.65 mg/dL (ref 0.44–1.00)
GFR, Estimated: >60 mL/min (ref >60)
Glucose, Bld: 90 mg/dL (ref 70–99)
Potassium: 3.7 mmol/L (ref 3.5–5.1)
Sodium: 141 mmol/L (ref 135–145)
Total Bilirubin: 0.7 mg/dL (ref 0.3–1.2)
Total Protein: 5 g/dL — ABNORMAL LOW (ref 6.5–8.1)

## 2020-12-09 LAB — CBC WITH DIFFERENTIAL/PLATELET
Abs Immature Granulocytes: 0.07 10*3/uL (ref 0.00–0.07)
Basophils Absolute: 0 10*3/uL (ref 0.0–0.1)
Basophils Relative: 0 %
Eosinophils Absolute: 0 10*3/uL (ref 0.0–0.5)
Eosinophils Relative: 0 %
HCT: 29.6 % — ABNORMAL LOW (ref 36.0–46.0)
Hemoglobin: 9.3 g/dL — ABNORMAL LOW (ref 12.0–15.0)
Immature Granulocytes: 1 %
Lymphocytes Relative: 14 %
Lymphs Abs: 0.8 10*3/uL (ref 0.7–4.0)
MCH: 26.9 pg (ref 26.0–34.0)
MCHC: 31.4 g/dL (ref 30.0–36.0)
MCV: 85.5 fL (ref 80.0–100.0)
Monocytes Absolute: 0.6 10*3/uL (ref 0.1–1.0)
Monocytes Relative: 11 %
Neutro Abs: 4.4 10*3/uL (ref 1.7–7.7)
Neutrophils Relative %: 74 %
Platelets: 444 10*3/uL — ABNORMAL HIGH (ref 150–400)
RBC: 3.46 MIL/uL — ABNORMAL LOW (ref 3.87–5.11)
RDW: 17.2 % — ABNORMAL HIGH (ref 11.5–15.5)
WBC: 5.9 10*3/uL (ref 4.0–10.5)
nRBC: 0 % (ref 0.0–0.2)

## 2020-12-09 MED ORDER — ETOPOSIDE CHEMO INJECTION 500 MG/25ML
Freq: Once | INTRAVENOUS | Status: AC
  Filled 2020-12-09: qty 8

## 2020-12-09 MED ORDER — SODIUM CHLORIDE 0.9 % IV SOLN
750.0000 mg/m2 | Freq: Once | INTRAVENOUS | Status: AC
  Administered 2020-12-10: 1220 mg via INTRAVENOUS
  Filled 2020-12-09: qty 61

## 2020-12-09 MED ORDER — SODIUM CHLORIDE 0.9 % IV SOLN
Freq: Once | INTRAVENOUS | Status: AC
  Administered 2020-12-10: 36 mg via INTRAVENOUS
  Filled 2020-12-09: qty 8

## 2020-12-09 MED ORDER — SODIUM CHLORIDE 0.9 % IV SOLN
Freq: Once | INTRAVENOUS | Status: AC
  Administered 2020-12-09: 18 mg via INTRAVENOUS
  Filled 2020-12-09: qty 4

## 2020-12-09 MED ORDER — LIP MEDEX EX OINT
TOPICAL_OINTMENT | CUTANEOUS | Status: AC
  Filled 2020-12-09: qty 7

## 2020-12-09 NOTE — Progress Notes (Addendum)
HEMATOLOGY-ONCOLOGY PROGRESS NOTE  SUBJECTIVE: The patient continues to tolerate her chemotherapy well overall.  She has no complaints this morning.  She denies mucositis, nausea, vomiting.  No constipation reported this morning.   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 Biopsy showed invasive and in situ ductal carcinoma, HER-2 equivocal by IHC (2+), negative by FISH (ratio 1.59), ER+ >95%, PR+ 85%, Ki67 10%.    Malignant neoplasm of left breast in female, estrogen receptor positive ([place])  10/12/2020 Initial Diagnosis   Malignant neoplasm of left breast in female, estrogen receptor positive ([place])   10/12/2020 Cancer Staging   Staging form: Breast, AJCC 8th Edition - Clinical stage from 10/12/2020: Stage IA (cT1b, cN0, cM0, G2, ER+, PR+, HER2-) - Signed by Nicholas Lose, MD on 10/15/2020 Stage prefix: Initial diagnosis   11/05/2020 Surgery   Left lumpectomy: Grade 1 IDC, 1.1 cm, intermediate grade DCIS, margins are negative, lymph node -0/1, ER greater than 95%, PR 85%, HER-2 negative, Ki-67 10%   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[place])  11/11/2020 Initial Diagnosis   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[place])   11/15/2020 -  Chemotherapy    Patient is on Treatment Plan: IP NON-HODGKINS LYMPHOMA EPOCH Q21D   Patient is on Antibody Plan: NON-HODGKINS LYMPHOMA RITUXIMAB Q21D    11/19/2020 -  Chemotherapy    Patient is on Treatment Plan: IP NON-HODGKINS LYMPHOMA EPOCH Q21D   Patient is on Antibody Plan: NON-HODGKINS LYMPHOMA RITUXIMAB Q21D       REVIEW OF SYSTEMS:   Constitutional:  Denies fevers, chills  Eyes: Denies blurriness of vision Ears, nose, mouth, throat, and face: Denies mucositis or sore throat Respiratory: Denies cough, dyspnea or wheezes Cardiovascular: Denies palpitation, chest discomfort Gastrointestinal:  Denies nausea, heartburn or change in bowel habits Skin: Denies abnormal skin rashes Lymphatics: Denies new lymphadenopathy or easy bruising Neurological: Denies dizziness and headache Behavioral/Psych: Mood is stable, no new changes  Extremities: No lower extremity edema All other systems were reviewed with the patient and are negative.  I have reviewed the past medical history, past surgical history, social history and family history with the patient and they are unchanged from previous note.   PHYSICAL EXAMINATION: ECOG PERFORMANCE STATUS: 1 - Symptomatic but completely ambulatory  Vitals:   12/08/20 2039 12/09/20 0516  BP: 127/69 116/71  Pulse: 66 60  Resp: 18 17  Temp: 98 F (36.7 C) 97.9 F (36.6 C)  SpO2: 98% 97%   Filed Weights   12/06/20 1520 12/09/20 0516  Weight: 58.8 kg 60.9 kg    Intake/Output from previous day: 04/06 0701 - 04/07 0700 In: 1092 [P.O.:240; I.V.:240; IV Piggyback:612] Out: -   GENERAL:alert, no distress and comfortable SKIN: skin color, texture, turgor are normal, no rashes or significant lesions EYES: normal, Conjunctiva are pink and non-injected, sclera clear OROPHARYNX:no exudate, no erythema and lips, buccal mucosa, and tongue normal  LUNGS: clear to auscultation and percussion with normal breathing effort HEART: regular rate & rhythm and no murmurs and no lower extremity edema ABDOMEN:abdomen soft, non-tender and normal bowel sounds Musculoskeletal:no cyanosis of  digits and no clubbing  NEURO: alert & oriented x 3 with fluent speech, no focal motor/sensory deficits  LABORATORY DATA:  I have reviewed the data as listed CMP Latest Ref Rng & Units 12/09/2020 12/08/2020 12/07/2020  Glucose 70 - 99 mg/dL  90 109(H) 133(H)  BUN 8 - 23 mg/dL 26(H) 22 20  Creatinine 0.44 - 1.00 mg/dL 0.65 0.69 0.56  Sodium 135 - 145 mmol/L 141 143 138  Potassium 3.5 - 5.1 mmol/L 3.7 3.9 4.0  Chloride 98 - 111 mmol/L 110 113(H) 107  CO2 22 - 32 mmol/L _0 Calcium 8.9 - 10.3 mg/dL 8.6(L) 8.7(L) 8.7(L)  Total Protein 6.5 - 8.1 g/dL 5.0(L) 5.4(L) 5.8(L)  Total Bilirubin 0.3 - 1.2 mg/dL 0.7 0.7 0.5  Alkaline Phos 38 - 126 U/L 79 86 97  AST 15 - 41 U/L 20 16 13(L)  ALT 0 - 44 U/L _1 Lab Results  Component Value Date   WBC 5.9 12/09/2020   HGB 9.3 (L) 12/09/2020   HCT 29.6 (L) 12/09/2020   MCV 85.5 12/09/2020   PLT 444 (H) 12/09/2020   NEUTROABS 4.4 12/09/2020    ECHOCARDIOGRAM COMPLETE  Result Date: 11/15/2020    ECHOCARDIOGRAM REPORT   Patient Name:   [place] Date of Exam: 11/15/2020 Medical Rec #:  [id number]           Height:       66.0 in Accession #:    [id number]          Weight:       126.1 lb Date of Birth:  [birth year]           BSA:          1.644 m Patient Age:    75 years            BP:           95/53 mmHg Patient Gender: F                   HR:           91 bpm. Exam Location:  Inpatient Procedure: 2D Echo, Cardiac Doppler and Color Doppler STAT ECHO Indications:    Chemo evaluation  History:        Patient has no prior history of Echocardiogram examinations.                 Breast cancer, Lymphoma.  Sonographer:    Dustin Flock Referring Phys: [place]  1. Left ventricular ejection fraction, by estimation, is 55 to 60%. The left ventricle has normal function. The left ventricle has no regional wall motion abnormalities. Left ventricular diastolic parameters are indeterminate. The average left ventricular global longitudinal strain is -18.9 %.  2. Right ventricular systolic function is normal. The right ventricular size is normal. There is normal pulmonary artery systolic pressure.  3. The mitral valve is normal in structure. Mild mitral valve  regurgitation.  4. The aortic valve is normal in structure. Aortic valve regurgitation is not visualized. FINDINGS  Left Ventricle: Left ventricular ejection fraction, by estimation, is 55 to 60%. The left ventricle has normal function. The left ventricle has no regional wall motion abnormalities. The average left ventricular global longitudinal strain is -18.9 %. The left ventricular internal cavity size was normal in size. There is no left ventricular hypertrophy. Left ventricular diastolic parameters are indeterminate. Right Ventricle: The right ventricular size is normal.  No increase in right ventricular wall thickness. Right ventricular systolic function is normal. There is normal pulmonary artery systolic pressure. The tricuspid regurgitant velocity is 2.30 m/s, and  with an assumed right atrial pressure of 3 mmHg, the estimated right ventricular systolic pressure is 16.1 mmHg. Left Atrium: Left atrial size was normal in size. Right Atrium: Right atrial size was normal in size. Pericardium: There is no evidence of pericardial effusion. Mitral Valve: The mitral valve is normal in structure. Mild mitral valve regurgitation. Tricuspid Valve: The tricuspid valve is grossly normal. Tricuspid valve regurgitation is trivial. Aortic Valve: The aortic valve is normal in structure. Aortic valve regurgitation is not visualized. Pulmonic Valve: The pulmonic valve was not well visualized. Pulmonic valve regurgitation is not visualized. Aorta: The aortic root and ascending aorta are structurally normal, with no evidence of dilitation. IAS/Shunts: The atrial septum is grossly normal.  LEFT VENTRICLE PLAX 2D LVIDd:         4.60 cm  Diastology LVIDs:         3.20 cm  LV e' medial:    7.40 cm/s LV PW:         1.00 cm  LV E/e' medial:  9.8 LV IVS:        0.90 cm  LV e' lateral:   8.59 cm/s LVOT diam:     2.30 cm  LV E/e' lateral: 8.4 LV SV:         116 LV SV Index:   70       2D Longitudinal Strain LVOT Area:     4.15 cm 2D  Strain GLS Avg:     -18.9 %  RIGHT VENTRICLE RV Basal diam:  3.20 cm RV S prime:     10.10 cm/s LEFT ATRIUM             Index       RIGHT ATRIUM           Index LA diam:        2.70 cm 1.64 cm/m  RA Area:     13.20 cm LA Vol (A2C):   46.1 ml 28.04 ml/m RA Volume:   29.40 ml  17.89 ml/m LA Vol (A4C):   41.3 ml 25.12 ml/m LA Biplane Vol: 43.6 ml 26.52 ml/m  AORTIC VALVE LVOT Vmax:   102.00 cm/s LVOT Vmean:  67.100 cm/s LVOT VTI:    0.278 m  AORTA Ao Root diam: 2.80 cm MITRAL VALVE               TRICUSPID VALVE MV Area (PHT): 4.15 cm    TR Peak grad:   21.2 mmHg MV Decel Time: 183 msec    TR Vmax:        230.00 cm/s MV E velocity: 72.40 cm/s MV A velocity: 61.70 cm/s  SHUNTS MV E/A ratio:  1.17        Systemic VTI:  0.28 m                            Systemic Diam: 2.30 cm Mertie Moores MD Electronically signed by Mertie Moores MD Signature Date/Time: 11/15/2020/1:43:19 PM    Final    IR IMAGING GUIDED PORT INSERTION  Result Date: 12/01/2020 CLINICAL DATA:  Left breast carcinoma and diffuse large B-cell lymphoma. The patient requires a porta cath to begin chemotherapy. EXAM: IMPLANTED PORT A CATH PLACEMENT WITH ULTRASOUND AND FLUOROSCOPIC GUIDANCE ANESTHESIA/SEDATION: 2.0 mg IV Versed; 100 mcg IV  Fentanyl Total Moderate Sedation Time:  34 minutes The patient's level of consciousness and physiologic status were continuously monitored during the procedure by Radiology nursing. FLUOROSCOPY TIME:  24 seconds.  3.0 mGy. PROCEDURE: The procedure, risks, benefits, and alternatives were explained to the patient. Questions regarding the procedure were encouraged and answered. The patient understands and consents to the procedure. A time-out was performed prior to initiating the procedure. Ultrasound was utilized to confirm patency of the right internal jugular vein. The right neck and chest were prepped with chlorhexidine in a sterile fashion, and a sterile drape was applied covering the operative field. Maximum  barrier sterile technique with sterile gowns and gloves were used for the procedure. Local anesthesia was provided with 1% lidocaine. After creating a small venotomy incision, a 21 gauge needle was advanced into the right internal jugular vein under direct, real-time ultrasound guidance. Ultrasound image documentation was performed. After securing guidewire access, an 8 Fr dilator was placed. A J-wire was kinked to measure appropriate catheter length. A subcutaneous port pocket was then created along the upper chest wall utilizing sharp and blunt dissection. Portable cautery was utilized. The pocket was irrigated with sterile saline. A single lumen power injectable port was chosen for placement. The 8 Fr catheter was tunneled from the port pocket site to the venotomy incision. The port was placed in the pocket. External catheter was trimmed to appropriate length based on guidewire measurement. At the venotomy, an 8 Fr peel-away sheath was placed over a guidewire. The catheter was then placed through the sheath and the sheath removed. Final catheter positioning was confirmed and documented with a fluoroscopic spot image. The port was accessed with a needle and aspirated and flushed with heparinized saline. The access needle was removed. The venotomy and port pocket incisions were closed with subcutaneous 3-0 Monocryl and subcuticular 4-0 Vicryl. Dermabond was applied to both incisions. COMPLICATIONS: COMPLICATIONS None FINDINGS: After catheter placement, the tip lies at the cavo-atrial junction. The catheter aspirates normally and is ready for immediate use. IMPRESSION: Placement of single lumen port a cath via right internal jugular vein. The catheter tip lies at the cavo-atrial junction. A power injectable port a cath was placed and is ready for immediate use. Electronically Signed   By: Aletta Edouard M.D.   On: 12/01/2020 11:20   [place] EKG SITE RITE  Result Date: 11/15/2020 If Site Rite image not attached,  placement could not be confirmed due to current cardiac rhythm.   ASSESSMENT AND PLAN: 1) large B-cell lymphoma -CT abdomen/pelvis with contrast 10/21/2020 - "Acute colitis/enteritis of the cecum/ascending colon and the associated terminal loops of ileum. As was recently described on CT imaging, the anatomy of distal ileum, ileocecal valve, cecum is atypical and correlation with patient's prior surgical record would be useful. The acute inflammatory changes contribute to at least a partial small bowel obstruction. Primary concern of this inflammatory cystic mass/bowel of the right lower quadrant is malignancy given that the posterior margin is inseparable from the psoas muscle, adnexa, and the right ureter. Correlation with CEA may be useful. Inflammatory/infectious changes are also a consideration." -Colonoscopy performed 11/04/2020- "An infiltrative and ulcerated partially obstructing large mass was found at 85 cm proximal to the anus. 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 -Plan is for adjuvant radiation therapy followed by adjuvant antiestrogen therapy  3)  iron deficiency anemia -Labs from 11/15/2020-ferritin 34, iron 24, percent  saturation 8%, TIBC 308 -Labs from 11/16/2020-vitamin B12 level 241, folate 7.4  4) history of right breast cancer diagnosed in 2011 status post lumpectomy and 5 years of antiestrogen therapy  5) depression/anxiety  6) hyperlipidemia  7) hypertension  8) hypothyroidism  9) allergies  PLAN:  -Labs from 12/09/2020 have been reviewed and are within normal limits except for RBC 3.46, hemoglobin 9.3, hematocrit 29.6, RDW 17.2 platelets 444,000, BUN 26, calcium 8.6, total protein 5.0, albumin 2.7.  Labs are adequate to proceed with day 4 of her treatment today. -Will monitor daily CBC with differential and CMET. -Continue as needed  antiemetics. -Continue MiraLAX and Senokot-S. -Lovenox for DVT prophylaxis. -Continue home medications. -Continue PPI and as needed Maalox for reflux symptoms. -She is scheduled on 12/13/2020 for rituximab and Neulasta and will have labs and follow-up visit on 12/16/2020.   LOS: 3 days   Mikey Bussing, DNP, AGPCNP-BC, AOCNP 12/09/20  ADDENDUM .Patient was Personally and independently interviewed, examined and relevant elements of the history of present illness were reviewed in details and an assessment and plan was created. All elements of the patient's history of present illness , assessment and plan were discussed in details with Mikey Bussing, DNP, AGPCNP-BC, AOCNP. The above documentation reflects our combined findings assessment and plan.  Sullivan Lone MD MS

## 2020-12-10 LAB — COMPREHENSIVE METABOLIC PANEL
ALT: 40 U/L (ref 0–44)
AST: 19 U/L (ref 15–41)
Albumin: 2.6 g/dL — ABNORMAL LOW (ref 3.5–5.0)
Alkaline Phosphatase: 72 U/L (ref 38–126)
Anion gap: 6 (ref 5–15)
BUN: 22 mg/dL (ref 8–23)
CO2: 27 mmol/L (ref 22–32)
Calcium: 8.7 mg/dL — ABNORMAL LOW (ref 8.9–10.3)
Chloride: 108 mmol/L (ref 98–111)
Creatinine, Ser: 0.6 mg/dL (ref 0.44–1.00)
GFR, Estimated: >60 mL/min (ref >60)
Glucose, Bld: 92 mg/dL (ref 70–99)
Potassium: 3.5 mmol/L (ref 3.5–5.1)
Sodium: 141 mmol/L (ref 135–145)
Total Bilirubin: 0.7 mg/dL (ref 0.3–1.2)
Total Protein: 5.1 g/dL — ABNORMAL LOW (ref 6.5–8.1)

## 2020-12-10 LAB — CBC WITH DIFFERENTIAL/PLATELET
Abs Immature Granulocytes: 0.03 10*3/uL (ref 0.00–0.07)
Basophils Absolute: 0 10*3/uL (ref 0.0–0.1)
Basophils Relative: 0 %
Eosinophils Absolute: 0 10*3/uL (ref 0.0–0.5)
Eosinophils Relative: 0 %
HCT: 29.2 % — ABNORMAL LOW (ref 36.0–46.0)
Hemoglobin: 9.3 g/dL — ABNORMAL LOW (ref 12.0–15.0)
Immature Granulocytes: 1 %
Lymphocytes Relative: 22 %
Lymphs Abs: 0.9 10*3/uL (ref 0.7–4.0)
MCH: 26.6 pg (ref 26.0–34.0)
MCHC: 31.8 g/dL (ref 30.0–36.0)
MCV: 83.7 fL (ref 80.0–100.0)
Monocytes Absolute: 0.2 10*3/uL (ref 0.1–1.0)
Monocytes Relative: 6 %
Neutro Abs: 2.9 10*3/uL (ref 1.7–7.7)
Neutrophils Relative %: 71 %
Platelets: 425 10*3/uL — ABNORMAL HIGH (ref 150–400)
RBC: 3.49 MIL/uL — ABNORMAL LOW (ref 3.87–5.11)
RDW: 16.9 % — ABNORMAL HIGH (ref 11.5–15.5)
WBC: 4.1 10*3/uL (ref 4.0–10.5)
nRBC: 0 % (ref 0.0–0.2)

## 2020-12-10 MED ORDER — HEPARIN SOD (PORK) LOCK FLUSH 100 UNIT/ML IV SOLN
500.0000 [IU] | INTRAVENOUS | Status: DC | PRN
  Filled 2020-12-10: qty 5

## 2020-12-10 MED ORDER — ALBUTEROL SULFATE HFA 108 (90 BASE) MCG/ACT IN AERS: 1.0000 | INHALATION_SPRAY | Freq: Four times a day (QID) | RESPIRATORY_TRACT | 5 refills | Status: DC | PRN

## 2020-12-10 MED ORDER — HEPARIN SOD (PORK) LOCK FLUSH 100 UNIT/ML IV SOLN
500.0000 [IU] | INTRAVENOUS | Status: DC
  Administered 2020-12-10: 500 [IU]

## 2020-12-10 NOTE — Discharge Summary (Addendum)
Discharge Summary  Patient ID: Lindsey Price MRN: [id number] DOB/AGE: [birth year] 75 y.o.  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[place])   Encounter for antineoplastic chemotherapy  Discharged Condition: good  Discharge Labs:   CBC    Component Value Date/Time   WBC 4.1 12/10/2020 0531   RBC 3.49 (L) 12/10/2020 0531   HGB 9.3 (L) 12/10/2020 0531   HGB 10.7 (L) 12/02/2020 0915   HGB 13.7 02/06/2019 0908   HGB 14.6 05/15/2014 0908   HCT 29.2 (L) 12/10/2020 0531   HCT 41.3 02/06/2019 0908   HCT 44.9 05/15/2014 0908   PLT 425 (H) 12/10/2020 0531   PLT 311 12/02/2020 0915   PLT 322 02/06/2019 0908   MCV 83.7 12/10/2020 0531   MCV 91 02/06/2019 0908   MCV 95.6 05/15/2014 0908   MCH 26.6 12/10/2020 0531   MCHC 31.8 12/10/2020 0531   RDW 16.9 (H) 12/10/2020 0531   RDW 13.1 02/06/2019 0908   RDW 12.7 05/15/2014 0908   LYMPHSABS 0.9 12/10/2020 0531   LYMPHSABS 1.3 02/06/2019 0908   LYMPHSABS 1.5 05/15/2014 0908   MONOABS 0.2 12/10/2020 0531   MONOABS 0.5 05/15/2014 0908   EOSABS 0.0 12/10/2020 0531   EOSABS 0.2 02/06/2019 0908   EOSABS 0.1 05/27/2010 1030   BASOSABS 0.0 12/10/2020 0531   BASOSABS 0.0 02/06/2019 0908   BASOSABS 0.0 05/15/2014 0908   CMP Latest Ref Rng & Units 12/10/2020 12/09/2020 12/08/2020  Glucose 70 - 99 mg/dL 92 90 109(H)  BUN 8 - 23 mg/dL 22 26(H) 22  Creatinine 0.44 - 1.00 mg/dL 0.60 0.65 0.69  Sodium 135 - 145 mmol/L 141 141 143  Potassium 3.5 - 5.1 mmol/L 3.5 3.7 3.9  Chloride 98 - 111 mmol/L 108 110 113(H)  CO2 22 - 32 mmol/L _0 Calcium 8.9 - 10.3 mg/dL 8.7(L) 8.6(L) 8.7(L)  Total Protein 6.5 - 8.1 g/dL 5.1(L) 5.0(L) 5.4(L)  Total Bilirubin 0.3 - 1.2 mg/dL 0.7 0.7 0.7  Alkaline Phos 38 - 126 U/L 72 79 86  AST 15 - 41 U/L _1 ALT 0 - 44 U/L 40 31 20    Significant Diagnostic Studies: None  Consults: None  Procedures: None  Disposition:  Discharge  disposition: 01-Home or Self Care      Allergies as of 12/10/2020      Reactions   Advair Diskus [fluticasone-salmeterol] Other (See Comments)   Hoarseness.   Neosporin [neomycin-polymyxin-gramicidin] Itching      Medication List    STOP taking these medications   omega-3 acid ethyl esters 1 g capsule Commonly known as: LOVAZA     TAKE these medications   albuterol 108 (90 Base) MCG/ACT inhaler Commonly known as: ProAir HFA Inhale 1-2 puffs into the lungs every 6 (six) hours as needed for wheezing or shortness of breath.   alum & mag hydroxide-simeth 200-200-20 MG/5ML suspension Commonly known as: MAALOX/MYLANTA Take 30 mLs by mouth every 4 (four) hours as needed for indigestion.   atorvastatin 10 MG tablet Commonly known as: LIPITOR Take 1 tablet (10 mg total) by mouth at bedtime.   calcium carbonate 1250 (500 Ca) MG tablet Commonly known as: OS-CAL - dosed in mg of elemental calcium Take 1,250 mg by mouth daily.   cyanocobalamin 1000 MCG/ML injection Commonly known as: (VITAMIN B-12) Inject 1 mL (1,000 mcg total) into the skin every 30 (thirty) days. Start taking on: December 16, 2020  dexamethasone 4 MG tablet Commonly known as: DECADRON Take 2 tablets (8 mg total) by mouth 2 (two) times daily with a meal. Take two times a day starting the day after chemotherapy for 3 days.   esomeprazole 20 MG capsule Commonly known as: NEXIUM Take 20 mg by mouth daily at 12 noon.   ezetimibe 10 MG tablet Commonly known as: Zetia Take 1 tablet (10 mg total) by mouth daily.   famotidine 20 MG tablet Commonly known as: PEPCID TAKE 1 TABLET (20 MG TOTAL) BY MOUTH 2 (TWO) TIMES DAILY AS NEEDED FOR HEARTBURN OR INDIGESTION.   feeding supplement Liqd Take 237 mLs by mouth 2 (two) times daily between meals.   fluticasone 50 MCG/ACT nasal spray Commonly known as: FLONASE PLACE 1 SPRAY INTO BOTH NOSTRILS DAILY.   HYDROcodone-acetaminophen 5-325 MG tablet Commonly known as:  NORCO/VICODIN Take 1-2 tablets by mouth every 6 (six) hours as needed for moderate pain or severe pain.   levocetirizine 5 MG tablet Commonly known as: XYZAL Take 5 mg by mouth daily.   levothyroxine 88 MCG tablet Commonly known as: SYNTHROID * What changed:   how much to take  how to take this  when to take this  additional instructions   levothyroxine 100 MCG tablet Commonly known as: SYNTHROID TAKE 1 TABLET EVERY OTHER DAY ALTERNATING WITH 88 MCG TABLET What changed:   how much to take  how to take this  when to take this  additional instructions   LORazepam 0.5 MG tablet Commonly known as: Ativan Take 1 tablet (0.5 mg total) by mouth every 6 (six) hours as needed (Nausea or vomiting).   mirtazapine 7.5 MG tablet Commonly known as: REMERON Take 1 tablet (7.5 mg total) by mouth at bedtime.   montelukast 10 MG tablet Commonly known as: SINGULAIR * What changed:   how much to take  how to take this  when to take this  additional instructions   multivitamin with minerals Tabs tablet Take 1 tablet by mouth daily.   ondansetron 8 MG tablet Commonly known as: Zofran Take 1 tablet (8 mg total) by mouth 2 (two) times daily. Take two times a day starting the day after chemo for 3 days. Then take two times a day as needed for nausea or vomiting. What changed: when to take this   polyethylene glycol 17 g packet Commonly known as: MIRALAX / GLYCOLAX Take 17 g by mouth daily.   prochlorperazine 10 MG tablet Commonly known as: COMPAZINE Take 1 tablet (10 mg total) by mouth every 6 (six) hours as needed (Nausea or vomiting).   prochlorperazine 25 MG suppository Commonly known as: COMPAZINE Place 1 suppository (25 mg total) rectally every 12 (twelve) hours as needed for nausea.   senna-docusate 8.6-50 MG tablet Commonly known as: Senokot-S Take 1 tablet by mouth 2 (two) times daily.   sodium bicarbonate/sodium chloride Soln 1 application by Mouth Rinse  route as needed for dry mouth.   venlafaxine XR 37.5 MG 24 hr capsule Commonly known as: EFFEXOR-XR Take 1 capsule (37.5 mg total) by mouth daily with breakfast.   Vitamin D (Ergocalciferol) 1.25 MG (50000 UNIT) Caps capsule Commonly known as: DRISDOL TAKE 1 CAPSULE BY MOUTH ONCE WEEKLY What changed:   how much to take  how to take this  when to take this  additional instructions         HPI: Ms. 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  The patient has been being followed by Dr. Lindi Adie in our office for her breast cancer.  The patient had a CT of the abdomen/pelvis with contrast due to abdominal pain performed on 10/21/2020 which showed acute colitis/enteritis of the cecum/ascending colon and the associated terminal loops of ileum, acute inflammatory changes contribute to at least a partial small bowel obstruction, primary concern of this inflammatory cystic mass/bowel of the right lower quadrant is malignancy given that the posterior margin is inseparable from the psoas muscle, adnexa, and right ureter.  A colonoscopy was performed on 11/04/2020 which showed an infiltrative and ulcerated partially obstructing large mass was found at 85 cm proximal to the anus. The mass was circumferential.  This mass was biopsied and was consistent with large B-cell lymphoma.  Due to this new finding, the patient was referred to Dr. Irene Limbo for consideration of systemic chemotherapy for treatment of her B-cell lymphoma.  Hospital Course: The patient was admitted on 12/06/2020 to begin  cycle #2 of EPOCH-R.  On the day of admission, she reported that she was having a good 4 to 5 days prior to the admission.  She was not having any issues with fevers, chills, headaches, dizziness, chest pain, shortness of breath, abdominal pain, nausea, vomiting, constipation, mucositis, bleeding.  Labs in the day of admission were adequate for treatment.  She started day 1 of cycle #2 of her chemotherapy as planned on 12/06/2020.  During the hospital admission, she tolerated chemotherapy well with no significant side effects.  She was seen on the morning of admission and was not having any issues with mucositis, nausea, vomiting, constipation.  Her exam remained unchanged.  Labs on the morning of discharge were reviewed and she has mild anemia which is stable.  This will be followed closely in the outpatient setting.  The patient is stable for discharge once her chemotherapy is complete.  She will follow-up in our office on 12/13/2020 for rituximab and Neulasta and will have labs and a follow-up visit on 12/16/2020.  Discharge Instructions    Diet general   Complete by: As directed    Increase activity slowly   Complete by: As directed      Signed: Mikey Bussing 12/10/2020, 8:50 AM    ADDENDUM  .Patient was Personally and independently interviewed, examined and relevant elements of the discharge plan were reviewed in details with Mikey Bussing DNP. The above documentation reflects our combined findings assessment and plan.  Sullivan Lone MD MS TT>30 mins

## 2020-12-10 NOTE — Care Management Important Message (Signed)
Medicare IM printed for Social Work staff to give to the patient 

## 2020-12-10 NOTE — Progress Notes (Signed)
Pt discharged home with spouse in stable condition. Discharge instructions given. No immediate questions or concerns at this time. Discharged from unit via wheelchair.  

## 2020-12-12 ENCOUNTER — Other Ambulatory Visit: Payer: Self-pay | Admitting: Physician Assistant

## 2020-12-12 DIAGNOSIS — C8510 Unspecified B-cell lymphoma, unspecified site: Secondary | ICD-10-CM

## 2020-12-12 DIAGNOSIS — C50412 Malignant neoplasm of upper-outer quadrant of left female breast: Secondary | ICD-10-CM

## 2020-12-12 NOTE — Progress Notes (Signed)
I connected by phone with [name] on 12/12/2020, 11:55 AM to discuss the potential use of a new treatment, tixagevimab/cilgavimab, for pre-exposure prophylaxis for prevention of coronavirus disease 2019 (COVID-19) caused by the SARS-CoV-2 virus.  This patient is a 75 y.o. female that meets the FDA criteria for Emergency Use Authorization of tixagevimab/cilgavimab for pre-exposure prophylaxis of COVID-19 disease. Pt meets following criteria:  Age >12 yr and weight > 40kg  Not currently infected with SARS-CoV-2 and has no known recent exposure to an individual infected with SARS-CoV-2 AND o Who has moderate to severe immune compromise due to a medical condition or receipt of immunosuppressive medications or treatments and may not mount an adequate immune response to COVID-19 vaccination or  o Vaccination with any available COVID-19 vaccine, according to the approved or authorized schedule, is not recommended due to a history of severe adverse reaction (e.g., severe allergic reaction) to a COVID-19 vaccine(s) and/or COVID-19 vaccine component(s).  o Patient meets the following definition of mod-severe immune compromised status: 6. Other actively treated hematologic malignancies or severe congenital immunodeficiency syndromes  I have spoken and communicated the following to the patient or parent/caregiver regarding COVID monoclonal antibody treatment:  1. FDA has authorized the emergency use of tixagevimab/cilgavimab for the pre-exposure prophylaxis of COVID-19 in patients with moderate-severe immunocompromised status, who meet above EUA criteria.  2. The significant known and potential risks and benefits of COVID monoclonal antibody, and the extent to which such potential risks and benefits are unknown.  3. Information on available alternative treatments and the risks and benefits of those alternatives, including clinical trials.  4. The patient or parent/caregiver has the option to accept  or refuse COVID monoclonal antibody treatment.  After reviewing this information with the patient, agree to receive tixagevimab/cilgavimab   Set up for tomorrow with her other chemo treatments at 7:30am.   Angelena Form, PA-C, 12/12/2020, 11:55 AM

## 2020-12-13 ENCOUNTER — Other Ambulatory Visit: Payer: Self-pay

## 2020-12-13 ENCOUNTER — Inpatient Hospital Stay: Payer: PPO | Attending: Hematology

## 2020-12-13 ENCOUNTER — Other Ambulatory Visit: Payer: Self-pay | Admitting: Hematology

## 2020-12-13 VITALS — BP 118/79 | HR 72 | Temp 98.0°F | Resp 18 | Wt 127.8 lb

## 2020-12-13 DIAGNOSIS — Z5189 Encounter for other specified aftercare: Secondary | ICD-10-CM | POA: Insufficient documentation

## 2020-12-13 DIAGNOSIS — C8519 Unspecified B-cell lymphoma, extranodal and solid organ sites: Secondary | ICD-10-CM | POA: Insufficient documentation

## 2020-12-13 DIAGNOSIS — Z5112 Encounter for antineoplastic immunotherapy: Secondary | ICD-10-CM | POA: Diagnosis not present

## 2020-12-13 DIAGNOSIS — C8510 Unspecified B-cell lymphoma, unspecified site: Secondary | ICD-10-CM

## 2020-12-13 MED ORDER — METHYLPREDNISOLONE SODIUM SUCC 125 MG IJ SOLR
INTRAMUSCULAR | Status: AC
  Filled 2020-12-13: qty 2

## 2020-12-13 MED ORDER — SODIUM CHLORIDE 0.9% FLUSH
10.0000 mL | INTRAVENOUS | Status: DC | PRN
  Administered 2020-12-13: 10 mL
  Filled 2020-12-13: qty 10

## 2020-12-13 MED ORDER — METHYLPREDNISOLONE SODIUM SUCC 125 MG IJ SOLR
60.0000 mg | Freq: Every day | INTRAMUSCULAR | Status: DC
  Administered 2020-12-13: 60 mg via INTRAVENOUS

## 2020-12-13 MED ORDER — FAMOTIDINE IN NACL 20-0.9 MG/50ML-% IV SOLN
INTRAVENOUS | Status: AC
  Filled 2020-12-13: qty 50

## 2020-12-13 MED ORDER — ACETAMINOPHEN 325 MG PO TABS
650.0000 mg | ORAL_TABLET | Freq: Once | ORAL | Status: AC
  Administered 2020-12-13: 650 mg via ORAL

## 2020-12-13 MED ORDER — PEGFILGRASTIM-CBQV 6 MG/0.6ML ~~LOC~~ SOSY
6.0000 mg | PREFILLED_SYRINGE | Freq: Once | SUBCUTANEOUS | Status: AC
  Administered 2020-12-13: 6 mg via SUBCUTANEOUS

## 2020-12-13 MED ORDER — FAMOTIDINE IN NACL 20-0.9 MG/50ML-% IV SOLN
20.0000 mg | Freq: Once | INTRAVENOUS | Status: AC
  Administered 2020-12-13: 20 mg via INTRAVENOUS

## 2020-12-13 MED ORDER — CILGAVIMAB (PART OF EVUSHELD) INJECTION
300.0000 mg | Freq: Once | INTRAMUSCULAR | Status: AC
  Administered 2020-12-13: 300 mg via INTRAMUSCULAR
  Filled 2020-12-13: qty 3

## 2020-12-13 MED ORDER — DIPHENHYDRAMINE HCL 25 MG PO CAPS
ORAL_CAPSULE | ORAL | Status: AC
  Filled 2020-12-13: qty 2

## 2020-12-13 MED ORDER — SODIUM CHLORIDE 0.9 % IV SOLN
375.0000 mg/m2 | Freq: Once | INTRAVENOUS | Status: AC
  Administered 2020-12-13: 600 mg via INTRAVENOUS
  Filled 2020-12-13: qty 50

## 2020-12-13 MED ORDER — SODIUM CHLORIDE 0.9 % IV SOLN
Freq: Once | INTRAVENOUS | Status: AC
  Filled 2020-12-13: qty 250

## 2020-12-13 MED ORDER — PEGFILGRASTIM-CBQV 6 MG/0.6ML ~~LOC~~ SOSY
PREFILLED_SYRINGE | SUBCUTANEOUS | Status: AC
  Filled 2020-12-13: qty 0.6

## 2020-12-13 MED ORDER — ACETAMINOPHEN 325 MG PO TABS
ORAL_TABLET | ORAL | Status: AC
  Filled 2020-12-13: qty 2

## 2020-12-13 MED ORDER — TIXAGEVIMAB (PART OF EVUSHELD) INJECTION
300.0000 mg | Freq: Once | INTRAMUSCULAR | Status: AC
  Administered 2020-12-13: 300 mg via INTRAMUSCULAR
  Filled 2020-12-13: qty 3

## 2020-12-13 MED ORDER — DIPHENHYDRAMINE HCL 25 MG PO CAPS
50.0000 mg | ORAL_CAPSULE | Freq: Once | ORAL | Status: AC
  Administered 2020-12-13: 50 mg via ORAL

## 2020-12-13 MED ORDER — HEPARIN SOD (PORK) LOCK FLUSH 100 UNIT/ML IV SOLN
500.0000 [IU] | Freq: Once | INTRAVENOUS | Status: AC | PRN
  Administered 2020-12-13: 500 [IU]
  Filled 2020-12-13: qty 5

## 2020-12-13 NOTE — Patient Instructions (Signed)
Rituximab Injection What is this medicine? RITUXIMAB (ri TUX i mab) is a monoclonal antibody. It is used to treat certain types of cancer like non-Hodgkin lymphoma and chronic lymphocytic leukemia. It is also used to treat rheumatoid arthritis, granulomatosis with polyangiitis, microscopic polyangiitis, and pemphigus vulgaris. This medicine may be used for other purposes; ask your health care provider or pharmacist if you have questions. COMMON BRAND NAME(S): RIABNI, Rituxan, RUXIENCE What should I tell my health care provider before I take this medicine? They need to know if you have any of these conditions:  chest pain  heart disease  infection especially a viral infection such as chickenpox, cold sores, hepatitis B, or herpes  immune system problems  irregular heartbeat or rhythm  kidney disease  low blood counts (white cells, platelets, or red cells)  lung disease  recent or upcoming vaccine  an unusual or allergic reaction to rituximab, other medicines, foods, dyes, or preservatives  pregnant or trying to get pregnant  breast-feeding How should I use this medicine? This medicine is injected into a vein. It is given by a health care provider in a hospital or clinic setting. A special MedGuide will be given to you before each treatment. Be sure to read this information carefully each time. Talk to your health care provider about the use of this medicine in children. While this drug may be prescribed for children as young as 2 years for selected conditions, precautions do apply. Overdosage: If you think you have taken too much of this medicine contact a poison control center or emergency room at once. NOTE: This medicine is only for you. Do not share this medicine with others. What if I miss a dose? Keep appointments for follow-up doses. It is important not to miss your dose. Call your health care provider if you are unable to keep an appointment. What may interact with this  medicine? Do not take this medicine with any of the following medicines:  live vaccines This medicine may also interact with the following medicines:  cisplatin This list may not describe all possible interactions. Give your health care provider a list of all the medicines, herbs, non-prescription drugs, or dietary supplements you use. Also tell them if you smoke, drink alcohol, or use illegal drugs. Some items may interact with your medicine. What should I watch for while using this medicine? Your condition will be monitored carefully while you are receiving this medicine. You may need blood work done while you are taking this medicine. This medicine can cause serious infusion reactions. To reduce the risk your health care provider may give you other medicines to take before receiving this one. Be sure to follow the directions from your health care provider. This medicine may increase your risk of getting an infection. Call your health care provider for advice if you get a fever, chills, sore throat, or other symptoms of a cold or flu. Do not treat yourself. Try to avoid being around people who are sick. Call your health care provider if you are around anyone with measles, chickenpox, or if you develop sores or blisters that do not heal properly. Avoid taking medicines that contain aspirin, acetaminophen, ibuprofen, naproxen, or ketoprofen unless instructed by your health care provider. These medicines may hide a fever. This medicine may cause serious skin reactions. They can happen weeks to months after starting the medicine. Contact your health care provider right away if you notice fevers or flu-like symptoms with a rash. The rash may be red   or purple and then turn into blisters or peeling of the skin. Or, you might notice a red rash with swelling of the face, lips or lymph nodes in your neck or under your arms. In some patients, this medicine may cause a serious brain infection that may cause  death. If you have any problems seeing, thinking, speaking, walking, or standing, tell your healthcare professional right away. If you cannot reach your healthcare professional, urgently seek other source of medical care. Do not become pregnant while taking this medicine or for at least 12 months after stopping it. Women should inform their health care provider if they wish to become pregnant or think they might be pregnant. There is potential for serious harm to an unborn child. Talk to your health care provider for more information. Women should use a reliable form of birth control while taking this medicine and for 12 months after stopping it. Do not breast-feed while taking this medicine or for at least 6 months after stopping it. What side effects may I notice from receiving this medicine? Side effects that you should report to your health care provider as soon as possible:  allergic reactions (skin rash, itching or hives; swelling of the face, lips, or tongue)  diarrhea  edema (sudden weight gain; swelling of the ankles, feet, hands or other unusual swelling; trouble breathing)  fast, irregular heartbeat  heart attack (trouble breathing; pain or tightness in the chest, neck, back or arms; unusually weak or tired)  infection (fever, chills, cough, sore throat, pain or trouble passing urine)  kidney injury (trouble passing urine or change in the amount of urine)  liver injury (dark yellow or brown urine; general ill feeling or flu-like symptoms; loss of appetite, right upper belly pain; unusually weak or tired, yellowing of the eyes or skin)  low blood pressure (dizziness; feeling faint or lightheaded, falls; unusually weak or tired)  low red blood cell counts (trouble breathing; feeling faint; lightheaded, falls; unusually weak or tired)  mouth sores  redness, blistering, peeling, or loosening of the skin, including inside the mouth  stomach pain  unusual bruising or  bleeding  wheezing (trouble breathing with loud or whistling sounds)  vomiting Side effects that usually do not require medical attention (report to your health care provider if they continue or are bothersome):  headache  joint pain  muscle cramps, pain  nausea This list may not describe all possible side effects. Call your doctor for medical advice about side effects. You may report side effects to FDA at 1-800-FDA-1088. Where should I keep my medicine? This medicine is given in a hospital or clinic. It will not be stored at home. NOTE: This sheet is a summary. It may not cover all possible information. If you have questions about this medicine, talk to your doctor, pharmacist, or health care provider.  2021 Elsevier/Gold Standard (2020-06-03 21:35:50)

## 2020-12-15 ENCOUNTER — Other Ambulatory Visit: Payer: Self-pay

## 2020-12-15 DIAGNOSIS — C8510 Unspecified B-cell lymphoma, unspecified site: Secondary | ICD-10-CM

## 2020-12-15 NOTE — Progress Notes (Signed)
HEMATOLOGY-ONCOLOGY PROGRESS NOTE   Date of Encounter: 12/02/2020  CHIEF COMPLAINT /PURPOSE OF CONSULTATION: Large B-Cell Lymphoma   HISTORY OF PRESENTING ILLNESS See previous note.    INTERVAL HISTORY  Lindsey Price is a wonderful 75 y.o. 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 The patient's last visit with [place] was on 12/02/2020. The pt reports that she is doing well overall.  The pt reports that she has been doing well, but notes some recent issues with more sores. The pt has tolerated the GF shot better this second time thus far with no body aches. The pt notes that she has been using the mouthwash given to her in the hospital. The pt notes that the Remeron has been helping her sleep better the last few nights.   Lab results today 12/16/2020 of CBC w/diff and CMP is as follows: all values are WNL except for WBC of 1.8K, RBC of 3.44, Hgb of 9.3, HCT of 28.9, RDW of 18.0, Calcium of 8.1, Total Protein of 5.2, Albumin of 2.9, AST of 10.  On review of systems, pt reports mouth sores, intermittent headaches, intermittent lightheadness, intermittent acid reflux and denies fevers, chills, body aches, abdominal pain, dizziness,  Back pain, difficulty sleeping, dehydration, sudden weight loss, and any other symptoms.  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 Biopsy showed invasive and in situ ductal carcinoma, HER-2 equivocal by IHC (2+), negative by FISH (ratio 1.59), ER+ >95%, PR+ 85%, Ki67 10%.     Malignant neoplasm of left breast in female, estrogen receptor positive ([place])  10/12/2020 Initial Diagnosis   Malignant neoplasm of left breast in female, estrogen receptor positive ([place])   10/12/2020 Cancer Staging   Staging form: Breast, AJCC 8th Edition - Clinical stage from 10/12/2020: Stage IA (cT1b, cN0, cM0, G2, ER+, PR+, HER2-) - Signed by Nicholas Lose, MD on 10/15/2020 Stage prefix: Initial diagnosis   11/05/2020 Surgery   Left lumpectomy: Grade 1 IDC, 1.1 cm, intermediate grade DCIS, margins are negative, lymph node -0/1, ER greater than 95%, PR 85%, HER-2 negative, Ki-67 10%   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[place])  11/11/2020 Initial Diagnosis   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[place])   11/15/2020 -  Chemotherapy    Patient is on Treatment Plan: IP NON-HODGKINS LYMPHOMA EPOCH Q21D   Patient is on Antibody Plan: NON-HODGKINS LYMPHOMA RITUXIMAB Q21D    11/19/2020 -  Chemotherapy    Patient is on Treatment Plan: IP NON-HODGKINS LYMPHOMA EPOCH Q21D   Patient is on Antibody Plan: NON-HODGKINS LYMPHOMA RITUXIMAB Q21D      REVIEW OF SYSTEMS:   10 Point review of Systems was done is negative except as noted above.  PHYSICAL EXAMINATION: ECOG PERFORMANCE STATUS: 1 - Symptomatic but completely ambulatory  Vitals:   12/16/20 1527  BP: 137/66  Pulse: 99  Resp: 17  Temp: 98.6 F (37 C)  SpO2: 97%   Filed Weights   12/16/20 1527  Weight: 129 lb 9.6 oz (58.8 kg)     GENERAL:alert, in no  acute distress and comfortable SKIN: no acute rashes, no significant lesions EYES: conjunctiva are pink and non-injected, sclera anicteric OROPHARYNX: MMM, no exudates, no oropharyngeal erythema or ulceration. Mild redness in throat. NECK: supple, no JVD LYMPH:  no palpable lymphadenopathy in the cervical, axillary or inguinal regions LUNGS: clear to auscultation b/l with normal respiratory effort HEART: regular rate & rhythm ABDOMEN:  normoactive bowel sounds , non tender, not distended. Extremity: no pedal  edema PSYCH: alert & oriented x 3 with fluent speech NEURO: no focal motor/sensory deficits   LABORATORY DATA:  I have reviewed the data as listed CMP Latest Ref Rng & Units 12/16/2020 12/10/2020 12/09/2020  Glucose 70 - 99 mg/dL 96 92 90  BUN 8 - 23 mg/dL 23 22 26(H)  Creatinine 0.44 - 1.00 mg/dL 0.67 0.60 0.65  Sodium 135 - 145 mmol/L 141 141 141  Potassium 3.5 - 5.1 mmol/L 3.6 3.5 3.7  Chloride 98 - 111 mmol/L 108 108 110  CO2 22 - 32 mmol/L $RemoveB'23 27 26  'hxKgPlhN$ Calcium 8.9 - 10.3 mg/dL 8.1(L) 8.7(L) 8.6(L)  Total Protein 6.5 - 8.1 g/dL 5.2(L) 5.1(L) 5.0(L)  Total Bilirubin 0.3 - 1.2 mg/dL 0.5 0.7 0.7  Alkaline Phos 38 - 126 U/L 90 72 79  AST 15 - 41 U/L 10(L) 19 20  ALT 0 - 44 U/L 16 40 31    Lab Results  Component Value Date   WBC 1.8 (L) 12/16/2020   HGB 9.3 (L) 12/16/2020   HCT 28.9 (L) 12/16/2020   MCV 84.0 12/16/2020   PLT 157 12/16/2020   NEUTROABS 1.0 (L) 12/16/2020    IR IMAGING GUIDED PORT INSERTION  Result Date: 12/01/2020 CLINICAL DATA:  Left breast carcinoma and diffuse large B-cell lymphoma. The patient requires a porta cath to begin chemotherapy. EXAM: IMPLANTED PORT A CATH PLACEMENT WITH ULTRASOUND AND FLUOROSCOPIC GUIDANCE ANESTHESIA/SEDATION: 2.0 mg IV Versed; 100 mcg IV Fentanyl Total Moderate Sedation Time:  34 minutes The patient's level of consciousness and physiologic status were continuously monitored during the procedure by Radiology nursing. FLUOROSCOPY TIME:  24 seconds.  3.0 mGy. PROCEDURE: The procedure, risks, benefits, and alternatives were explained to the patient. Questions regarding the procedure were encouraged and answered. The patient understands and consents to the procedure. A time-out was performed prior to initiating the procedure. Ultrasound was utilized to confirm patency of the right internal jugular vein. The right neck and chest were prepped with chlorhexidine in a sterile fashion, and a sterile drape was applied covering the operative field.  Maximum barrier sterile technique with sterile gowns and gloves were used for the procedure. Local anesthesia was provided with 1% lidocaine. After creating a small venotomy incision, a 21 gauge needle was advanced into the right internal jugular vein under direct, real-time ultrasound guidance. Ultrasound image documentation was performed. After securing guidewire access, an 8 Fr dilator was placed. A J-wire was kinked to measure appropriate catheter length. A subcutaneous port pocket was then created along the upper chest wall utilizing sharp and blunt dissection. Portable cautery was utilized. The pocket was irrigated with sterile saline. A single lumen power injectable port was chosen for placement. The 8 Fr catheter was tunneled from the port pocket site to the venotomy incision. The port was placed in the pocket. External catheter was trimmed to appropriate length based on guidewire measurement. At the venotomy, an 8 Fr peel-away sheath was placed over a guidewire. The catheter was then placed through the sheath and the sheath removed. Final catheter positioning was  confirmed and documented with a fluoroscopic spot image. The port was accessed with a needle and aspirated and flushed with heparinized saline. The access needle was removed. The venotomy and port pocket incisions were closed with subcutaneous 3-0 Monocryl and subcuticular 4-0 Vicryl. Dermabond was applied to both incisions. COMPLICATIONS: COMPLICATIONS None FINDINGS: After catheter placement, the tip lies at the cavo-atrial junction. The catheter aspirates normally and is ready for immediate use. IMPRESSION: Placement of single lumen port a cath via right internal jugular vein. The catheter tip lies at the cavo-atrial junction. A power injectable port a cath was placed and is ready for immediate use. Electronically Signed   By: Aletta Edouard M.D.   On: 12/01/2020 11:20    ASSESSMENT AND PLAN:  1) large B-cell lymphoma -CT abdomen/pelvis  with contrast 10/21/2020 - "Acute colitis/enteritis of the cecum/ascending colon and the associated terminal loops of ileum. As was recently described on CT imaging, the anatomy of distal ileum, ileocecal valve, cecum is atypical and correlation with patient's prior surgical record would be useful. The acute inflammatory changes contribute to at least a partial small bowel obstruction. Primary concern of this inflammatory cystic mass/bowel of the right lower quadrant is malignancy given that the posterior margin is inseparable from the psoas muscle, adnexa, and the right ureter. Correlation with CEA may be useful. Inflammatory/infectious changes are also a consideration." -Colonoscopy performed 11/04/2020- "An infiltrative and ulcerated partially obstructing large mass was found at 85 cm proximal to the anus. 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 -Plan is for adjuvant radiation therapy followed by adjuvant antiestrogen therapy  3)  iron deficiency anemia -Labs from 11/15/2020-ferritin 34, iron 24, percent saturation 8%, TIBC 308 -Labs from 11/16/2020-vitamin B12 level 241, folate 7.4  4) history of right breast cancer diagnosed in 2011 status post lumpectomy and 5 years of antiestrogen therapy  5) depression/anxiety  6) hyperlipidemia  7) hypertension  8) hypothyroidism  9) allergies  PLAN:  1)large B-cell lymphoma -CT abdomen/pelvis with contrast 10/21/2020- "Acute colitis/enteritis of the cecum/ascending colon and the associated terminal loops of ileum. As was recently described on CT imaging, the anatomy of distal ileum, ileocecal valve, cecum is atypical and correlation with patient's prior surgical record would be useful. The acute inflammatory changes contribute to at least a partial small bowel obstruction. Primary concern of this inflammatory cystic mass/bowel of the  right lower quadrant is malignancy given that the posterior margin is inseparable from the psoas muscle, adnexa, and the right ureter. Correlation with CEA may be useful. Inflammatory/infectious changes are also a consideration." -Colonoscopy performed 11/04/2020-"An infiltrative and ulcerated partially obstructing large mass was found at 85 cm proximal to the anus. 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  -Recommended pt continue the baking soda and salt mouth washes multiple times  daily. -Advised pt that Magic Mouthwash will numb her mouth completely. The pt is aware and desires this to be prescribed. Advised pt this can be swallowed if reflux is causing pain.  -Advised pt that it it acceptable to take Advil sparingly for a headache if Tylenol does not work. Recommended pt take Tylenol first to see if this helps. -Advised pt that she will be seeing the NP next week for a toxicity check and to make sure all labs are stable. She will see Dr. Irene Limbo again prior to treat. -Advised pt that her PET scan is scheduled for tomorrow. The pt is aware and will go. -Recommended pt wlk 20-30 minutes daily, eat healthy, sleep well, and drink 48-64 oz water daily. -Advised pt that [facility] should  be able to fulfill the mouthwash, but this must be done by a compounding-able pharmacy. -Continue Vitamin B-Complex. -Will see back on 05/09 with labs. -Rx Magic Mouthwash.    FOLLOW UP: RTC with portflush, labs with Dede Query DNP in 1 week for toxicity check Plz schedule for 1 unit of PRBC in 1 week Schedule for inpatient [facility] chemotherapy for 5 days from 12/27/2020 Outpatient Rituxan and Udenyca on 01/03/2021 RTC with Dr Irene Limbo with portflush and labs 01/10/2021     All of the patients questions were answered with apparent satisfaction. The patient knows to call the clinic with any problems, questions or concerns.    The total time spent in the appointment was 20 minutes and more than 50% was on counseling and direct patient cares.   Sullivan Lone MD [place] AAHIVMS [facility] Hematology/Oncology Physician [facility]  (Office):       [phone] (Work cell):  [phone] (Fax):           [phone]   I, Reinaldo Raddle, am acting as scribe for Dr. Sullivan Lone, MD.   .I have reviewed the above documentation for accuracy and completeness, and I agree with the above.  Brunetta Genera MD

## 2020-12-16 ENCOUNTER — Other Ambulatory Visit: Payer: Self-pay

## 2020-12-16 ENCOUNTER — Inpatient Hospital Stay: Payer: PPO

## 2020-12-16 ENCOUNTER — Inpatient Hospital Stay: Payer: PPO | Admitting: Hematology

## 2020-12-16 VITALS — BP 137/66 | HR 99 | Temp 98.6°F | Resp 17 | Ht 66.0 in | Wt 129.6 lb

## 2020-12-16 DIAGNOSIS — C8510 Unspecified B-cell lymphoma, unspecified site: Secondary | ICD-10-CM | POA: Diagnosis not present

## 2020-12-16 DIAGNOSIS — Z95828 Presence of other vascular implants and grafts: Secondary | ICD-10-CM

## 2020-12-16 DIAGNOSIS — Z5112 Encounter for antineoplastic immunotherapy: Secondary | ICD-10-CM | POA: Diagnosis not present

## 2020-12-16 LAB — CBC WITH DIFFERENTIAL (CANCER CENTER ONLY)
Abs Immature Granulocytes: 0 10*3/uL (ref 0.00–0.07)
Band Neutrophils: 6 %
Basophils Absolute: 0 10*3/uL (ref 0.0–0.1)
Basophils Relative: 0 %
Eosinophils Absolute: 0 10*3/uL (ref 0.0–0.5)
Eosinophils Relative: 1 %
HCT: 28.9 % — ABNORMAL LOW (ref 36.0–46.0)
Hemoglobin: 9.3 g/dL — ABNORMAL LOW (ref 12.0–15.0)
Lymphocytes Relative: 19 %
Lymphs Abs: 0.3 10*3/uL — ABNORMAL LOW (ref 0.7–4.0)
MCH: 27 pg (ref 26.0–34.0)
MCHC: 32.2 g/dL (ref 30.0–36.0)
MCV: 84 fL (ref 80.0–100.0)
Metamyelocytes Relative: 1 %
Monocytes Absolute: 0.4 10*3/uL (ref 0.1–1.0)
Monocytes Relative: 23 %
Myelocytes: 1 %
Neutro Abs: 1 10*3/uL — ABNORMAL LOW (ref 1.7–7.7)
Neutrophils Relative %: 49 %
Platelet Count: 157 10*3/uL (ref 150–400)
RBC: 3.44 MIL/uL — ABNORMAL LOW (ref 3.87–5.11)
RDW: 18 % — ABNORMAL HIGH (ref 11.5–15.5)
WBC Count: 1.8 10*3/uL — ABNORMAL LOW (ref 4.0–10.5)
nRBC: 0 % (ref 0.0–0.2)

## 2020-12-16 LAB — CMP (CANCER CENTER ONLY)
ALT: 16 U/L (ref 0–44)
AST: 10 U/L — ABNORMAL LOW (ref 15–41)
Albumin: 2.9 g/dL — ABNORMAL LOW (ref 3.5–5.0)
Alkaline Phosphatase: 90 U/L (ref 38–126)
Anion gap: 10 (ref 5–15)
BUN: 23 mg/dL (ref 8–23)
CO2: 23 mmol/L (ref 22–32)
Calcium: 8.1 mg/dL — ABNORMAL LOW (ref 8.9–10.3)
Chloride: 108 mmol/L (ref 98–111)
Creatinine: 0.67 mg/dL (ref 0.44–1.00)
GFR, Estimated: >60 mL/min (ref >60)
Glucose, Bld: 96 mg/dL (ref 70–99)
Potassium: 3.6 mmol/L (ref 3.5–5.1)
Sodium: 141 mmol/L (ref 135–145)
Total Bilirubin: 0.5 mg/dL (ref 0.3–1.2)
Total Protein: 5.2 g/dL — ABNORMAL LOW (ref 6.5–8.1)

## 2020-12-16 MED ORDER — MAGIC MOUTHWASH W/LIDOCAINE: 5.0000 mL | Freq: Four times a day (QID) | ORAL | 1 refills | Status: DC | PRN

## 2020-12-16 MED ORDER — HEPARIN SOD (PORK) LOCK FLUSH 100 UNIT/ML IV SOLN
500.0000 [IU] | Freq: Once | INTRAVENOUS | Status: AC
  Administered 2020-12-16: 500 [IU] via INTRAVENOUS
  Filled 2020-12-16: qty 5

## 2020-12-16 MED ORDER — SODIUM CHLORIDE 0.9% FLUSH
10.0000 mL | Freq: Once | INTRAVENOUS | Status: AC
  Administered 2020-12-16: 10 mL via INTRAVENOUS
  Filled 2020-12-16: qty 10

## 2020-12-16 NOTE — Patient Instructions (Signed)

## 2020-12-17 ENCOUNTER — Ambulatory Visit (HOSPITAL_COMMUNITY)
Admission: RE | Admit: 2020-12-17 | Discharge: 2020-12-17 | Disposition: A | Discharge status: Home / Self Care | Payer: PPO | Source: Ambulatory Visit | Attending: Hematology | Admitting: Hematology

## 2020-12-17 DIAGNOSIS — R911 Solitary pulmonary nodule: Secondary | ICD-10-CM | POA: Diagnosis not present

## 2020-12-17 DIAGNOSIS — I700 Atherosclerosis of aorta: Secondary | ICD-10-CM | POA: Insufficient documentation

## 2020-12-17 DIAGNOSIS — C8510 Unspecified B-cell lymphoma, unspecified site: Secondary | ICD-10-CM | POA: Insufficient documentation

## 2020-12-17 DIAGNOSIS — C8330 Diffuse large B-cell lymphoma, unspecified site: Secondary | ICD-10-CM | POA: Diagnosis not present

## 2020-12-17 DIAGNOSIS — I2510 Atherosclerotic heart disease of native coronary artery without angina pectoris: Secondary | ICD-10-CM | POA: Insufficient documentation

## 2020-12-17 DIAGNOSIS — K760 Fatty (change of) liver, not elsewhere classified: Secondary | ICD-10-CM | POA: Insufficient documentation

## 2020-12-17 LAB — GLUCOSE, CAPILLARY: Glucose-Capillary: 103 mg/dL — ABNORMAL HIGH (ref 70–99)

## 2020-12-17 MED ORDER — FLUDEOXYGLUCOSE F - 18 (FDG) INJECTION
7.0000 | Freq: Once | INTRAVENOUS | Status: AC | PRN
  Administered 2020-12-17: 6.4 via INTRAVENOUS

## 2020-12-18 ENCOUNTER — Other Ambulatory Visit: Payer: Self-pay | Admitting: Hematology

## 2020-12-18 ENCOUNTER — Other Ambulatory Visit: Payer: Self-pay | Admitting: Gastroenterology

## 2020-12-21 ENCOUNTER — Telehealth: Payer: Self-pay

## 2020-12-21 ENCOUNTER — Telehealth: Payer: Self-pay | Admitting: Hematology

## 2020-12-21 NOTE — Telephone Encounter (Signed)
Left message with follow-up appointments per 4/14 los. Gave option to call back to reschedule if needed. 

## 2020-12-21 NOTE — Telephone Encounter (Signed)
Pt contacted to let her know she has been set up for a Covid test on Friday 12/24/20 . Pt informed that bed placement would call her on Monday 12/27/20 to tell her when to come in for her inpt stay for chemo. Pt verbalized understanding.

## 2020-12-23 NOTE — Progress Notes (Signed)
[place] [phone]   [phone]  PROGRESS NOTE  Patient Care Team: Hamrick, Lorin Mercy, MD as PCP - General (Family Medicine) Juanito Doom, MD as Consulting Physician (Pulmonary Disease) Elsie Saas, MD as Consulting Physician (Orthopedic Surgery) Allyn Kenner, MD as Consulting Physician (Dermatology) Katy Apo, MD as Consulting Physician (Ophthalmology) Jovita Kussmaul, MD as Consulting Physician (General Surgery) Nicholas Lose, MD as Consulting Physician (Hematology and Oncology) Garlan Fair, MD as Consulting Physician (Gastroenterology) Gardenia Phlegm, NP as Nurse Practitioner (Hematology and Oncology)  Hematological/Oncological History Oncology History  Cancer of right breast, stage 0  09/13/2009 Initial Biopsy   Right breast core needle biopsy: 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 Biopsy showed invasive and in situ ductal carcinoma, HER-2 equivocal by IHC (2+), negative by FISH (ratio 1.59), ER+ >95%, PR+ 85%, Ki67 10%.    Malignant neoplasm of left breast in female, estrogen receptor positive ([place])  10/12/2020 Initial Diagnosis   Malignant neoplasm of left breast in female, estrogen receptor positive ([place])   10/12/2020 Cancer Staging   Staging form: Breast, AJCC 8th Edition - Clinical stage from 10/12/2020: Stage IA (cT1b, cN0, cM0, G2, ER+, PR+, HER2-) - Signed by Nicholas Lose, MD on 10/15/2020 Stage prefix: Initial diagnosis   11/05/2020 Surgery   Left lumpectomy: Grade 1 IDC, 1.1 cm, intermediate grade DCIS, margins are negative, lymph node -0/1, ER greater than 95%, PR 85%, HER-2 negative,  Ki-67 10%   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 female who presents to the clinic for a follow up evaluation and management of large B-cell lymphoma. Patient is scheduled for Cycle 3, Day 1 of EPOCH on 12/27/2020. Patient is unaccompanied for this visit.  She reports that her energy levels are overall stable with intermittent days of fatigue. She continues to complete her ADLs on her own. She denies any nausea, vomiting or abdominal pain. Patient has intermittent episodes of diarrhea that resolves on its own. She does not require anti-diarrheals at this time. Patient denies any hematochezia or melena. 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 Patient has improvement of oral mucositis with Magic Mouthwash and is able to eat without discomfort. Patient denies any fevers, chills, shortness of breath, chest pain, cough, edema or skin changes. She has no other complaints. Rest of the 10 point ROS is below.   MEDICAL HISTORY:  Past Medical History:  Diagnosis Date  . Allergy   . Anemia   . Anxiety   . Arthralgia 09/11/2011  . Asthma    cough variant asthma  . Breast cancer ([place])  2011   RIGHT BREAST CA  . Breast cancer, left ([place]) 09/2020   left breast IDC  . Cataract   . 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 Depression   . GERD (gastroesophageal reflux disease)   . Hyperlipidemia   . Hypertension   . Hypothyroidism    . Insomnia   . Thyroid disease    hypothyroidism    SURGICAL HISTORY: Past Surgical History:  Procedure Laterality Date  . ABDOMINAL HYSTERECTOMY  1975   partial  . BREAST BIOPSY Right 09/10/2013   BENIGN  . BREAST BIOPSY Left 09/07/2011   BENIGN  . BREAST LUMPECTOMY Right 2011  . BREAST LUMPECTOMY WITH RADIOACTIVE SEED AND SENTINEL LYMPH NODE BIOPSY Left 11/05/2020   Procedure: LEFT BREAST LUMPECTOMY WITH RADIOACTIVE SEED AND SENTINEL LYMPH NODE BIOPSY;  Surgeon: Jovita Kussmaul, MD;  Location: [facility];  Service: General;  Laterality: Left;  . CATARACT EXTRACTION, BILATERAL    . COLONOSCOPY  2015  . COLONOSCOPY WITH PROPOFOL N/A 04/14/2014   Procedure: COLONOSCOPY WITH PROPOFOL;  Surgeon: Garlan Fair, MD;  Location: [facility];  Service: Endoscopy;  Laterality: N/A;  . IR IMAGING GUIDED PORT INSERTION  12/01/2020  . POLYPECTOMY    . ROTATOR CUFF REPAIR Left 04/2013   Dr. Para March    SOCIAL HISTORY: Social History   Socioeconomic History  . Marital status: Married    Spouse name: Not on file  . Number of children: Not on file  . Years of education: Not on file  . Highest education level: Not on file  Occupational History  . Not on file  Tobacco Use  . Smoking status: Former Smoker    Packs/day: 1.00    Years: 15.00    Pack years: 15.00    Types: Cigarettes    Quit date: 12/09/1978    Years since quitting: 42.0  . Smokeless tobacco: Never Used  Vaping Use  . Vaping Use: Never used  Substance and Sexual Activity  . Alcohol use: Yes    Alcohol/week: 7.0 standard drinks    Types: 7 Glasses of wine per week    Comment: SOCIALLY wine  . Drug use: No  . Sexual activity: Yes    Birth control/protection: Surgical  Other Topics Concern  . Not on file  Social History Narrative  . Not on file   Social Determinants of Health   Financial Resource Strain: Not on file  Food Insecurity: Not on file  Transportation Needs: Not on file  Physical  Activity: Not on file  Stress: Not on file  Social Connections: Not on file  Intimate Partner Violence: Not At Risk  . Fear of Current or Ex-Partner: No  . Emotionally Abused: No  . Physically Abused: No  . Sexually Abused: No    FAMILY HISTORY: Family History  Problem Relation Age of Onset  . Cancer Mother        BREAST  . Cancer Father        pancreatic  . Cancer Sister        sebacous cell carcinoma  . Cancer Maternal Aunt        lung  . Cancer Paternal Uncle        colon, stomach  . Colon cancer Neg Hx   . Colon polyps Neg Hx   . Esophageal cancer Neg Hx   . Rectal cancer Neg Hx   . Stomach cancer Neg Hx     ALLERGIES:  is allergic to advair diskus [fluticasone-salmeterol] and neosporin [neomycin-polymyxin-gramicidin].  MEDICATIONS:  Current Outpatient Medications  Medication Sig Dispense Refill  . lidocaine-prilocaine (EMLA) cream Apply 1 application topically as needed. 30 g 0  . albuterol (PROAIR HFA) 108 (90 Base) MCG/ACT inhaler Inhale 1-2 puffs into the lungs every 6 (six) hours as needed for wheezing or shortness of breath. 8 g 5  . alum & mag hydroxide-simeth (MAALOX/MYLANTA) 200-200-20 MG/5ML suspension Take 30 mLs by mouth every 4 (four) hours as needed for indigestion. 355 mL 0  . atorvastatin (LIPITOR) 10 MG tablet Take 1 tablet (10 mg total) by mouth at bedtime. 90 tablet 0  . calcium carbonate (OS-CAL - DOSED IN MG OF ELEMENTAL CALCIUM) 1250 (500 Ca) MG tablet Take 1,250 mg by mouth daily.    . cyanocobalamin (,VITAMIN B-12,) 1000 MCG/ML injection Inject 1 mL (1,000 mcg total) into the skin every 30 (thirty) days. 1 mL 0  . dexamethasone (DECADRON) 4 MG tablet Take 2 tablets (8 mg total) by mouth 2 (two) times daily with a meal. Take two times a day starting the day after chemotherapy for 3 days. 30 tablet 1  . esomeprazole (NEXIUM) 20 MG capsule Take 20 mg by mouth daily at 12 noon.    . ezetimibe (ZETIA) 10 MG tablet Take 1 tablet (10 mg total) by mouth  daily. 90 tablet 3  . famotidine (PEPCID) 20 MG tablet TAKE 1 TABLET (20 MG TOTAL) BY MOUTH 2 (TWO) TIMES DAILY AS NEEDED FOR HEARTBURN OR INDIGESTION. 60 tablet 1  . feeding supplement (ENSURE ENLIVE / ENSURE PLUS) LIQD Take 237 mLs by mouth 2 (two) times daily between meals. 237 mL 12  . fluticasone (FLONASE) 50 MCG/ACT nasal spray PLACE 1 SPRAY INTO BOTH NOSTRILS DAILY. 48 mL 1  . HYDROcodone-acetaminophen (NORCO/VICODIN) 5-325 MG tablet Take 1-2 tablets by mouth every 6 (six) hours as needed for moderate pain or severe pain. 10 tablet 0  . levocetirizine (XYZAL) 5 MG tablet Take 5 mg by mouth daily.    Marland Kitchen levothyroxine (SYNTHROID) 100 MCG tablet TAKE 1 TABLET EVERY OTHER DAY ALTERNATING WITH 88 MCG TABLET (Patient taking differently: Take 100 mcg by mouth every other day. Alternating with 48mcg) 45 tablet 1  . levothyroxine (SYNTHROID) 88 MCG tablet * (Patient taking differently: Take 88 mcg by mouth every other day. Alternating with 117mcg) 45 tablet 1  . LORazepam (ATIVAN) 0.5 MG tablet Take 1 tablet (0.5 mg total) by mouth every 6 (six) hours as needed (Nausea or vomiting). 30 tablet 0  . magic mouthwash w/lidocaine SOLN Take 5 mLs by mouth 4 (four) times daily as needed for mouth pain. Swish and swallow 1 part diphenhydramine 12.5 mg per 5 mL elixir (39ml), 1 part Maalox (do not substitute Kaopectate) 9ml, 1 part 2% viscous lidocaine (40 ml), 1 part nystatin 500000 units/ml (46ml) and 1 part distilled water (21ml). Plz dispense 200 ml 200 mL 1  . mirtazapine (REMERON) 7.5 MG tablet Take 1 tablet (7.5 mg total) by mouth at bedtime. 30 tablet 0  . montelukast (SINGULAIR) 10 MG tablet * (Patient taking differently: Take 10 mg by mouth daily.) 90 tablet 1  . Multiple Vitamin (MULTIVITAMIN WITH MINERALS) TABS tablet Take 1 tablet by mouth daily.    . ondansetron (ZOFRAN) 8 MG tablet Take 1 tablet (8 mg total) by mouth 2 (two) times daily. Take two times a day starting the day after chemo for 3  days. Then take two times a day as needed  for nausea or vomiting. (Patient taking differently: Take 8 mg by mouth See admin instructions. Take two times a day starting the day after chemo for 3 days. Then take two times a day as needed for nausea or vomiting.) 30 tablet 1  . polyethylene glycol (MIRALAX / GLYCOLAX) 17 g packet Take 17 g by mouth daily. 14 each 0  . prochlorperazine (COMPAZINE) 10 MG tablet Take 1 tablet (10 mg total) by mouth every 6 (six) hours as needed (Nausea or vomiting). 30 tablet 1  . prochlorperazine (COMPAZINE) 25 MG suppository Place 1 suppository (25 mg total) rectally every 12 (twelve) hours as needed for nausea. 12 suppository 3  . senna-docusate (SENOKOT-S) 8.6-50 MG tablet Take 1 tablet by mouth 2 (two) times daily.    . Sodium Chloride-Sodium Bicarb (SODIUM BICARBONATE/SODIUM CHLORIDE) SOLN 1 application by Mouth Rinse route as needed for dry mouth.    . venlafaxine XR (EFFEXOR-XR) 37.5 MG 24 hr capsule TAKE 1 CAPSULE BY MOUTH DAILY WITH BREAKFAST. 90 capsule 1  . Vitamin D, Ergocalciferol, (DRISDOL) 1.25 MG (50000 UT) CAPS capsule TAKE 1 CAPSULE BY MOUTH ONCE WEEKLY (Patient taking differently: Take 50,000 Units by mouth every Friday.) 12 capsule 1   No current facility-administered medications for this visit.    REVIEW OF SYSTEMS:   Constitutional: ( - ) fevers, ( - )  chills , ( - ) night sweats Eyes: ( - ) blurriness of vision, ( - ) double vision, ( - ) watery eyes Ears, nose, mouth, throat, and face: ( - ) mucositis, ( - ) sore throat Respiratory: ( - ) cough, ( - ) dyspnea, ( - ) wheezes Cardiovascular: ( - ) palpitation, ( - ) chest discomfort, ( - ) lower extremity swelling Gastrointestinal:  ( - ) nausea, ( - ) heartburn, ( + ) diarrhea Skin: ( - ) abnormal skin rashes Lymphatics: ( - ) new lymphadenopathy, ( - ) easy bruising Neurological: ( + ) numbness, ( - ) tingling, ( - ) new weaknesses Behavioral/Psych: ( - ) mood change, ( - ) new changes  All  other systems were reviewed with the patient and are negative.  PHYSICAL EXAMINATION: ECOG PERFORMANCE STATUS: 1 - Symptomatic but completely ambulatory  Vitals:   12/24/20 0839  BP: 126/68  Pulse: 78  Resp: 18  Temp: (!) 97.4 F (36.3 C)  SpO2: 100%   Filed Weights   12/24/20 0839  Weight: 129 lb 14.4 oz (58.9 kg)    GENERAL: well appearing female in NAD  SKIN: skin color, texture, turgor are normal, no rashes or significant lesions EYES: conjunctiva are pink and non-injected, sclera clear OROPHARYNX: no exudate, no erythema; lips, buccal mucosa, and tongue normal  NECK: supple, non-tender LYMPH:  no palpable lymphadenopathy in the cervical, axillary or supraclavicular lymph nodes.  LUNGS: clear to auscultation and percussion with normal breathing effort HEART: regular rate & rhythm and no murmurs and no lower extremity edema ABDOMEN: soft, non-tender, non-distended, normal bowel sounds Musculoskeletal: no cyanosis of digits and no clubbing  PSYCH: alert & oriented x 3, fluent speech NEURO: no focal motor/sensory deficits  LABORATORY DATA:  I have reviewed the data as listed CBC Latest Ref Rng & Units 12/16/2020 12/10/2020 12/09/2020  WBC 4.0 - 10.5 K/uL 1.8(L) 4.1 5.9  Hemoglobin 12.0 - 15.0 g/dL 9.3(L) 9.3(L) 9.3(L)  Hematocrit 36.0 - 46.0 % 28.9(L) 29.2(L) 29.6(L)  Platelets 150 - 400 K/uL 157 425(H) 444(H)    CMP Latest Ref Rng & Units  12/16/2020 12/10/2020 12/09/2020  Glucose 70 - 99 mg/dL 96 92 90  BUN 8 - 23 mg/dL 23 22 26(H)  Creatinine 0.44 - 1.00 mg/dL 0.67 0.60 0.65  Sodium 135 - 145 mmol/L 141 141 141  Potassium 3.5 - 5.1 mmol/L 3.6 3.5 3.7  Chloride 98 - 111 mmol/L 108 108 110  CO2 22 - 32 mmol/L $RemoveB'23 27 26  'eSmmPmvE$ Calcium 8.9 - 10.3 mg/dL 8.1(L) 8.7(L) 8.6(L)  Total Protein 6.5 - 8.1 g/dL 5.2(L) 5.1(L) 5.0(L)  Total Bilirubin 0.3 - 1.2 mg/dL 0.5 0.7 0.7  Alkaline Phos 38 - 126 U/L 90 72 79  AST 15 - 41 U/L 10(L) 19 20  ALT 0 - 44 U/L 16 40 31     RADIOGRAPHIC  STUDIES: I have personally reviewed the radiological images as listed and agreed with the findings in the report. NM PET Image Initial (PI) Skull Base To Thigh  Result Date: 12/18/2020 CLINICAL DATA:  Initial treatment strategy for new diagnosis of large B-cell lymphoma involving the distal small bowel and cecum. Chemotherapy including on 12/06/2020. EXAM: NUCLEAR MEDICINE PET SKULL BASE TO THIGH TECHNIQUE: 6.4 mCi F-18 FDG was injected intravenously. Full-ring PET imaging was performed from the skull base to thigh after the radiotracer. CT data was obtained and used for attenuation correction and anatomic localization. Fasting blood glucose: 103 mg/dl COMPARISON:  10/21/2020 abdominopelvic CT.  CTA chest of 02/12/2006. FINDINGS: Motion degraded CT images. Mediastinal blood pool activity: SUV max 1.4 Liver activity: SUV max 2.3 NECK: No areas of abnormal hypermetabolism. Incidental CT findings: No cervical adenopathy. Presumed sebaceous cyst about the posterior right neck at 8 mm on 12/04. CHEST: No pulmonary parenchymal or thoracic nodal hypermetabolism. Incidental CT findings:Mild aortic and lad coronary artery calcification. Right Port-A-Cath tip at mid right atrium. ABDOMEN/PELVIS: The right lower quadrant/ileocecal mass is less distinct today, given motion degradation in this area. Corresponds to hypermetabolism at a S.U.V. max of 6.7 including on 151/4. No abdominopelvic nodal hypermetabolism. The spleen is normal in size, mildly hypermetabolic relative to the liver. Incidental CT findings: Aortic atherosclerosis. Hysterectomy. Pelvic floor laxity. Large colonic stool burden. Mild hepatic steatosis. SKELETON: Diffuse moderate marrow hypermetabolism, with an example area in the L4 vertebral body measuring a S.U.V. max of 9.0. Incidental CT findings: none IMPRESSION: 1. Hypermetabolism corresponding to the right lower quadrant, ileocolic mass consistent with the clinical history of bowel lymphoma. 2. Diffuse  marrow hypermetabolism which could represent marrow stimulation by chemotherapy or marrow involvement by lymphoma. 3. Mild hypermetabolism of the spleen relative to the liver, nonspecific. No splenomegaly. 4. Incidental findings, including: Coronary artery atherosclerosis. Aortic Atherosclerosis (ICD10-I70.0). Hepatic steatosis. Possible constipation. 5. Motion degraded CT images. The right lower lobe pulmonary nodule described on prior CTs is not readily apparent but likely below PET resolution. Electronically Signed   By: Abigail Miyamoto M.D.   On: 12/18/2020 18:07   IR IMAGING GUIDED PORT INSERTION  Result Date: 12/01/2020 CLINICAL DATA:  Left breast carcinoma and diffuse large B-cell lymphoma. The patient requires a porta cath to begin chemotherapy. EXAM: IMPLANTED PORT A CATH PLACEMENT WITH ULTRASOUND AND FLUOROSCOPIC GUIDANCE ANESTHESIA/SEDATION: 2.0 mg IV Versed; 100 mcg IV Fentanyl Total Moderate Sedation Time:  34 minutes The patient's level of consciousness and physiologic status were continuously monitored during the procedure by Radiology nursing. FLUOROSCOPY TIME:  24 seconds.  3.0 mGy. PROCEDURE: The procedure, risks, benefits, and alternatives were explained to the patient. Questions regarding the procedure were encouraged and answered. The patient understands and consents  to the procedure. A time-out was performed prior to initiating the procedure. Ultrasound was utilized to confirm patency of the right internal jugular vein. The right neck and chest were prepped with chlorhexidine in a sterile fashion, and a sterile drape was applied covering the operative field. Maximum barrier sterile technique with sterile gowns and gloves were used for the procedure. Local anesthesia was provided with 1% lidocaine. After creating a small venotomy incision, a 21 gauge needle was advanced into the right internal jugular vein under direct, real-time ultrasound guidance. Ultrasound image documentation was  performed. After securing guidewire access, an 8 Fr dilator was placed. A J-wire was kinked to measure appropriate catheter length. A subcutaneous port pocket was then created along the upper chest wall utilizing sharp and blunt dissection. Portable cautery was utilized. The pocket was irrigated with sterile saline. A single lumen power injectable port was chosen for placement. The 8 Fr catheter was tunneled from the port pocket site to the venotomy incision. The port was placed in the pocket. External catheter was trimmed to appropriate length based on guidewire measurement. At the venotomy, an 8 Fr peel-away sheath was placed over a guidewire. The catheter was then placed through the sheath and the sheath removed. Final catheter positioning was confirmed and documented with a fluoroscopic spot image. The port was accessed with a needle and aspirated and flushed with heparinized saline. The access needle was removed. The venotomy and port pocket incisions were closed with subcutaneous 3-0 Monocryl and subcuticular 4-0 Vicryl. Dermabond was applied to both incisions. COMPLICATIONS: COMPLICATIONS None FINDINGS: After catheter placement, the tip lies at the cavo-atrial junction. The catheter aspirates normally and is ready for immediate use. IMPRESSION: Placement of single lumen port a cath via right internal jugular vein. The catheter tip lies at the cavo-atrial junction. A power injectable port a cath was placed and is ready for immediate use. Electronically Signed   By: Aletta Edouard M.D.   On: 12/01/2020 11:20    ASSESSMENT & PLAN Lindsey Price is a 74 y.o. female presenting to the clinic for evaluation prior to cycle 3 of EPOCH scheduled for 12/27/2020.   #Large B-cell Lymphoma: --CT abdomen/pelvis with contrast 10/21/2020- "Acute colitis/enteritis of the cecum/ascending colon and the associated terminal loops of ileum. As was recently described on CT imaging, the anatomy of distal ileum, ileocecal  valve, cecum is atypical and correlation with patient's prior surgical record would be useful. The acute inflammatory changes contribute to at least a partial small bowel obstruction. Primary concern of this inflammatory cystic mass/bowel of the right lower quadrant is malignancy given that the posterior margin is inseparable from the psoas muscle, adnexa, and the right ureter. --Colonoscopy performed 11/04/2020-"An infiltrative and ulcerated partially obstructing large mass was found at 85 cm proximal to the anus. The mass was circumferential." Biopsy of the colon mass consistent with large B-cell lymphoma --Patient initiated treatment with EPOCH on 11/15/2020. --PET scan from 12/17/2020 reveals hypermetabolism corresponding to the right lower quadrant, ileocolic mass consistent with the clinical history of bowel lymphoma. Diffuse marrow hypermetabolism which could represent marrow stimulation by chemotherapy or marrow involvement by lymphoma. Mild hypermetabolism of the spleen relative to the liver,nonspecific. No splenomegaly.Incidental findings, including: Coronary artery atherosclerosis.Aortic Atherosclerosis (ICD10-I70.0). Hepatic steatosis. Possible constipation. Motion degraded CT images. The right lower lobe pulmonary nodule described on prior CTs is not readily apparent but likely below PET Resolution. --Patient presents today, 12/24/2020 for a toxicity evaluation prior to Cycle 3 of [facility] scheduled for 12/27/2020.  Labs from today were reviewed without any intervention required. Hemoglobin is stable. WBC has improved after Udenyca injection on 12/13/2020. Patient will proceed with treatment as planned. She will return to the clinic to see Dr. Damian Hofstra Limbo on 01/14/2021 for an evaluation prior to C4.   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   #Diarrhea: --Intermittent episodes. Advised to take OTC Imodium as needed.   Orders Placed This Encounter  Procedures  . CBC with Differential (Cancer Center Only)    Standing Status:   Future    Number of Occurrences:   1    Standing Expiration Date:   12/24/2021  . CMP ([place] only)    Standing Status:   Future    Number of Occurrences:   1    Standing Expiration Date:   12/24/2021  . Lactate dehydrogenase (LDH)    Standing Status:   Future    Number of Occurrences:   1    Standing Expiration Date:   12/24/2021    All questions were answered. The patient knows to call the clinic with any problems, questions or concerns.  A total of more than 30 minutes were spent on this encounter and over half of that time was spent on counseling and coordination of care as outlined above.    Dede Query, PA-C Department of Hematology/Oncology [place] [facility], [place] Phone: [phone]

## 2020-12-24 ENCOUNTER — Other Ambulatory Visit: Payer: Self-pay | Admitting: Hematology

## 2020-12-24 ENCOUNTER — Inpatient Hospital Stay: Payer: PPO

## 2020-12-24 ENCOUNTER — Inpatient Hospital Stay: Payer: PPO | Admitting: Physician Assistant

## 2020-12-24 ENCOUNTER — Other Ambulatory Visit: Payer: Self-pay

## 2020-12-24 ENCOUNTER — Other Ambulatory Visit (HOSPITAL_COMMUNITY)
Admission: RE | Admit: 2020-12-24 | Discharge: 2020-12-24 | Disposition: A | Discharge status: Home / Self Care | Payer: PPO | Source: Ambulatory Visit | Attending: Hematology | Admitting: Hematology

## 2020-12-24 VITALS — BP 126/68 | HR 78 | Temp 97.4°F | Resp 18 | Ht 66.0 in | Wt 129.9 lb

## 2020-12-24 DIAGNOSIS — Z20822 Contact with and (suspected) exposure to covid-19: Secondary | ICD-10-CM | POA: Insufficient documentation

## 2020-12-24 DIAGNOSIS — Z170 Estrogen receptor positive status [ER+]: Secondary | ICD-10-CM | POA: Diagnosis not present

## 2020-12-24 DIAGNOSIS — Z95828 Presence of other vascular implants and grafts: Secondary | ICD-10-CM | POA: Insufficient documentation

## 2020-12-24 DIAGNOSIS — E039 Hypothyroidism, unspecified: Secondary | ICD-10-CM | POA: Diagnosis present

## 2020-12-24 DIAGNOSIS — F419 Anxiety disorder, unspecified: Secondary | ICD-10-CM | POA: Diagnosis present

## 2020-12-24 DIAGNOSIS — C8510 Unspecified B-cell lymphoma, unspecified site: Secondary | ICD-10-CM | POA: Diagnosis not present

## 2020-12-24 DIAGNOSIS — Z01812 Encounter for preprocedural laboratory examination: Secondary | ICD-10-CM | POA: Insufficient documentation

## 2020-12-24 DIAGNOSIS — Y92239 Unspecified place in hospital as the place of occurrence of the external cause: Secondary | ICD-10-CM | POA: Diagnosis not present

## 2020-12-24 DIAGNOSIS — C8519 Unspecified B-cell lymphoma, extranodal and solid organ sites: Secondary | ICD-10-CM | POA: Diagnosis not present

## 2020-12-24 DIAGNOSIS — F32A Depression, unspecified: Secondary | ICD-10-CM | POA: Diagnosis present

## 2020-12-24 DIAGNOSIS — D509 Iron deficiency anemia, unspecified: Secondary | ICD-10-CM | POA: Diagnosis present

## 2020-12-24 DIAGNOSIS — T451X5A Adverse effect of antineoplastic and immunosuppressive drugs, initial encounter: Secondary | ICD-10-CM | POA: Diagnosis not present

## 2020-12-24 DIAGNOSIS — Z5111 Encounter for antineoplastic chemotherapy: Secondary | ICD-10-CM | POA: Diagnosis present

## 2020-12-24 DIAGNOSIS — C8333 Diffuse large B-cell lymphoma, intra-abdominal lymph nodes: Secondary | ICD-10-CM | POA: Diagnosis present

## 2020-12-24 DIAGNOSIS — D630 Anemia in neoplastic disease: Secondary | ICD-10-CM | POA: Diagnosis present

## 2020-12-24 DIAGNOSIS — Z9841 Cataract extraction status, right eye: Secondary | ICD-10-CM | POA: Diagnosis not present

## 2020-12-24 DIAGNOSIS — D72829 Elevated white blood cell count, unspecified: Secondary | ICD-10-CM | POA: Diagnosis present

## 2020-12-24 DIAGNOSIS — I10 Essential (primary) hypertension: Secondary | ICD-10-CM | POA: Diagnosis present

## 2020-12-24 DIAGNOSIS — D75839 Thrombocytosis, unspecified: Secondary | ICD-10-CM | POA: Diagnosis present

## 2020-12-24 DIAGNOSIS — Z9842 Cataract extraction status, left eye: Secondary | ICD-10-CM | POA: Diagnosis not present

## 2020-12-24 DIAGNOSIS — D0512 Intraductal carcinoma in situ of left breast: Secondary | ICD-10-CM | POA: Diagnosis present

## 2020-12-24 DIAGNOSIS — E860 Dehydration: Secondary | ICD-10-CM | POA: Diagnosis present

## 2020-12-24 DIAGNOSIS — Z888 Allergy status to other drugs, medicaments and biological substances status: Secondary | ICD-10-CM | POA: Diagnosis not present

## 2020-12-24 DIAGNOSIS — Z800 Family history of malignant neoplasm of digestive organs: Secondary | ICD-10-CM | POA: Diagnosis not present

## 2020-12-24 DIAGNOSIS — D649 Anemia, unspecified: Secondary | ICD-10-CM | POA: Diagnosis not present

## 2020-12-24 DIAGNOSIS — E785 Hyperlipidemia, unspecified: Secondary | ICD-10-CM | POA: Diagnosis present

## 2020-12-24 DIAGNOSIS — K5903 Drug induced constipation: Secondary | ICD-10-CM | POA: Diagnosis not present

## 2020-12-24 DIAGNOSIS — C50412 Malignant neoplasm of upper-outer quadrant of left female breast: Secondary | ICD-10-CM

## 2020-12-24 LAB — CBC WITH DIFFERENTIAL (CANCER CENTER ONLY)
Abs Immature Granulocytes: 2.27 10*3/uL — ABNORMAL HIGH (ref 0.00–0.07)
Basophils Absolute: 0.2 10*3/uL — ABNORMAL HIGH (ref 0.0–0.1)
Basophils Relative: 1 %
Eosinophils Absolute: 0 10*3/uL (ref 0.0–0.5)
Eosinophils Relative: 0 %
HCT: 31 % — ABNORMAL LOW (ref 36.0–46.0)
Hemoglobin: 9.7 g/dL — ABNORMAL LOW (ref 12.0–15.0)
Immature Granulocytes: 20 %
Lymphocytes Relative: 9 %
Lymphs Abs: 1 10*3/uL (ref 0.7–4.0)
MCH: 27.2 pg (ref 26.0–34.0)
MCHC: 31.3 g/dL (ref 30.0–36.0)
MCV: 87.1 fL (ref 80.0–100.0)
Monocytes Absolute: 1.3 10*3/uL — ABNORMAL HIGH (ref 0.1–1.0)
Monocytes Relative: 11 %
Neutro Abs: 6.4 10*3/uL (ref 1.7–7.7)
Neutrophils Relative %: 59 %
Platelet Count: 246 10*3/uL (ref 150–400)
RBC: 3.56 MIL/uL — ABNORMAL LOW (ref 3.87–5.11)
RDW: 20.1 % — ABNORMAL HIGH (ref 11.5–15.5)
WBC Count: 11.1 10*3/uL — ABNORMAL HIGH (ref 4.0–10.5)
nRBC: 0.4 % — ABNORMAL HIGH (ref 0.0–0.2)

## 2020-12-24 LAB — CMP (CANCER CENTER ONLY)
ALT: 18 U/L (ref 0–44)
AST: 19 U/L (ref 15–41)
Albumin: 2.9 g/dL — ABNORMAL LOW (ref 3.5–5.0)
Alkaline Phosphatase: 120 U/L (ref 38–126)
Anion gap: 8 (ref 5–15)
BUN: 16 mg/dL (ref 8–23)
CO2: 24 mmol/L (ref 22–32)
Calcium: 8.4 mg/dL — ABNORMAL LOW (ref 8.9–10.3)
Chloride: 107 mmol/L (ref 98–111)
Creatinine: 0.65 mg/dL (ref 0.44–1.00)
GFR, Estimated: >60 mL/min (ref >60)
Glucose, Bld: 90 mg/dL (ref 70–99)
Potassium: 4.1 mmol/L (ref 3.5–5.1)
Sodium: 139 mmol/L (ref 135–145)
Total Bilirubin: 0.4 mg/dL (ref 0.3–1.2)
Total Protein: 5.9 g/dL — ABNORMAL LOW (ref 6.5–8.1)

## 2020-12-24 LAB — LACTATE DEHYDROGENASE: LDH: 283 U/L — ABNORMAL HIGH (ref 98–192)

## 2020-12-24 MED ORDER — LIDOCAINE-PRILOCAINE 2.5-2.5 % EX CREA: 1.0000 "application " | TOPICAL_CREAM | CUTANEOUS | 0 refills | Status: DC | PRN

## 2020-12-24 MED ORDER — SODIUM CHLORIDE 0.9% FLUSH
10.0000 mL | INTRAVENOUS | Status: DC | PRN
  Administered 2020-12-24: 10 mL
  Filled 2020-12-24: qty 10

## 2020-12-25 ENCOUNTER — Inpatient Hospital Stay: Payer: PPO

## 2020-12-25 LAB — SARS CORONAVIRUS 2 (TAT 6-24 HRS): SARS Coronavirus 2: NEGATIVE

## 2020-12-27 ENCOUNTER — Inpatient Hospital Stay (HOSPITAL_COMMUNITY)
Admission: AD | Admit: 2020-12-27 | Discharge: 2020-12-31 | DRG: 847 | Disposition: A | Discharge status: Home / Self Care | Payer: PPO | Attending: Hematology | Admitting: Hematology

## 2020-12-27 ENCOUNTER — Encounter (HOSPITAL_COMMUNITY): Payer: Self-pay | Admitting: Hematology

## 2020-12-27 ENCOUNTER — Other Ambulatory Visit: Payer: Self-pay

## 2020-12-27 DIAGNOSIS — Z888 Allergy status to other drugs, medicaments and biological substances status: Secondary | ICD-10-CM | POA: Diagnosis not present

## 2020-12-27 DIAGNOSIS — D72829 Elevated white blood cell count, unspecified: Secondary | ICD-10-CM | POA: Diagnosis present

## 2020-12-27 DIAGNOSIS — C8519 Unspecified B-cell lymphoma, extranodal and solid organ sites: Secondary | ICD-10-CM | POA: Diagnosis not present

## 2020-12-27 DIAGNOSIS — D630 Anemia in neoplastic disease: Secondary | ICD-10-CM | POA: Diagnosis present

## 2020-12-27 DIAGNOSIS — C8510 Unspecified B-cell lymphoma, unspecified site: Secondary | ICD-10-CM

## 2020-12-27 DIAGNOSIS — Z5111 Encounter for antineoplastic chemotherapy: Principal | ICD-10-CM

## 2020-12-27 DIAGNOSIS — E860 Dehydration: Secondary | ICD-10-CM | POA: Diagnosis present

## 2020-12-27 DIAGNOSIS — K1231 Oral mucositis (ulcerative) due to antineoplastic therapy: Secondary | ICD-10-CM

## 2020-12-27 DIAGNOSIS — D500 Iron deficiency anemia secondary to blood loss (chronic): Secondary | ICD-10-CM

## 2020-12-27 DIAGNOSIS — F419 Anxiety disorder, unspecified: Secondary | ICD-10-CM | POA: Diagnosis present

## 2020-12-27 DIAGNOSIS — F32A Depression, unspecified: Secondary | ICD-10-CM | POA: Diagnosis present

## 2020-12-27 DIAGNOSIS — Z20822 Contact with and (suspected) exposure to covid-19: Secondary | ICD-10-CM | POA: Diagnosis present

## 2020-12-27 DIAGNOSIS — E785 Hyperlipidemia, unspecified: Secondary | ICD-10-CM | POA: Diagnosis present

## 2020-12-27 DIAGNOSIS — C8333 Diffuse large B-cell lymphoma, intra-abdominal lymph nodes: Secondary | ICD-10-CM | POA: Diagnosis present

## 2020-12-27 DIAGNOSIS — I10 Essential (primary) hypertension: Secondary | ICD-10-CM | POA: Diagnosis present

## 2020-12-27 DIAGNOSIS — Z800 Family history of malignant neoplasm of digestive organs: Secondary | ICD-10-CM

## 2020-12-27 DIAGNOSIS — D509 Iron deficiency anemia, unspecified: Secondary | ICD-10-CM | POA: Diagnosis present

## 2020-12-27 DIAGNOSIS — T451X5A Adverse effect of antineoplastic and immunosuppressive drugs, initial encounter: Secondary | ICD-10-CM | POA: Diagnosis not present

## 2020-12-27 DIAGNOSIS — Z170 Estrogen receptor positive status [ER+]: Secondary | ICD-10-CM | POA: Diagnosis not present

## 2020-12-27 DIAGNOSIS — K5903 Drug induced constipation: Secondary | ICD-10-CM | POA: Diagnosis not present

## 2020-12-27 DIAGNOSIS — D0512 Intraductal carcinoma in situ of left breast: Secondary | ICD-10-CM | POA: Diagnosis present

## 2020-12-27 DIAGNOSIS — R1013 Epigastric pain: Secondary | ICD-10-CM

## 2020-12-27 DIAGNOSIS — Y92239 Unspecified place in hospital as the place of occurrence of the external cause: Secondary | ICD-10-CM | POA: Diagnosis not present

## 2020-12-27 DIAGNOSIS — D75839 Thrombocytosis, unspecified: Secondary | ICD-10-CM | POA: Diagnosis present

## 2020-12-27 DIAGNOSIS — D649 Anemia, unspecified: Secondary | ICD-10-CM | POA: Diagnosis not present

## 2020-12-27 DIAGNOSIS — E039 Hypothyroidism, unspecified: Secondary | ICD-10-CM | POA: Diagnosis present

## 2020-12-27 DIAGNOSIS — Z9842 Cataract extraction status, left eye: Secondary | ICD-10-CM | POA: Diagnosis not present

## 2020-12-27 DIAGNOSIS — Z9841 Cataract extraction status, right eye: Secondary | ICD-10-CM

## 2020-12-27 LAB — CBC WITH DIFFERENTIAL/PLATELET
Abs Immature Granulocytes: 0.49 10*3/uL — ABNORMAL HIGH (ref 0.00–0.07)
Basophils Absolute: 0.2 10*3/uL — ABNORMAL HIGH (ref 0.0–0.1)
Basophils Relative: 1 %
Eosinophils Absolute: 0 10*3/uL (ref 0.0–0.5)
Eosinophils Relative: 0 %
HCT: 36.9 % (ref 36.0–46.0)
Hemoglobin: 11.5 g/dL — ABNORMAL LOW (ref 12.0–15.0)
Immature Granulocytes: 4 %
Lymphocytes Relative: 9 %
Lymphs Abs: 1.2 10*3/uL (ref 0.7–4.0)
MCH: 27.6 pg (ref 26.0–34.0)
MCHC: 31.2 g/dL (ref 30.0–36.0)
MCV: 88.7 fL (ref 80.0–100.0)
Monocytes Absolute: 1.4 10*3/uL — ABNORMAL HIGH (ref 0.1–1.0)
Monocytes Relative: 10 %
Neutro Abs: 10.7 10*3/uL — ABNORMAL HIGH (ref 1.7–7.7)
Neutrophils Relative %: 76 %
Platelets: 524 10*3/uL — ABNORMAL HIGH (ref 150–400)
RBC: 4.16 MIL/uL (ref 3.87–5.11)
RDW: 21.4 % — ABNORMAL HIGH (ref 11.5–15.5)
WBC: 14 10*3/uL — ABNORMAL HIGH (ref 4.0–10.5)
nRBC: 0 % (ref 0.0–0.2)

## 2020-12-27 LAB — COMPREHENSIVE METABOLIC PANEL
ALT: 19 U/L (ref 0–44)
AST: 19 U/L (ref 15–41)
Albumin: 3.5 g/dL (ref 3.5–5.0)
Alkaline Phosphatase: 97 U/L (ref 38–126)
Anion gap: 8 (ref 5–15)
BUN: 23 mg/dL (ref 8–23)
CO2: 26 mmol/L (ref 22–32)
Calcium: 8.8 mg/dL — ABNORMAL LOW (ref 8.9–10.3)
Chloride: 104 mmol/L (ref 98–111)
Creatinine, Ser: 1 mg/dL (ref 0.44–1.00)
GFR, Estimated: 58 mL/min — ABNORMAL LOW (ref >60)
Glucose, Bld: 128 mg/dL — ABNORMAL HIGH (ref 70–99)
Potassium: 3.8 mmol/L (ref 3.5–5.1)
Sodium: 138 mmol/L (ref 135–145)
Total Bilirubin: 0.7 mg/dL (ref 0.3–1.2)
Total Protein: 6.8 g/dL (ref 6.5–8.1)

## 2020-12-27 LAB — LACTATE DEHYDROGENASE: LDH: 190 U/L (ref 98–192)

## 2020-12-27 LAB — URIC ACID: Uric Acid, Serum: 6.7 mg/dL (ref 2.5–7.1)

## 2020-12-27 MED ORDER — ETOPOSIDE CHEMO INJECTION 500 MG/25ML
Freq: Once | INTRAVENOUS | Status: AC
  Filled 2020-12-27: qty 8

## 2020-12-27 MED ORDER — FAMOTIDINE 20 MG PO TABS: 20.0000 mg | ORAL_TABLET | Freq: Two times a day (BID) | ORAL | Status: DC | PRN

## 2020-12-27 MED ORDER — SODIUM CHLORIDE 0.9 % IV SOLN: INTRAVENOUS | Status: DC

## 2020-12-27 MED ORDER — LEVOTHYROXINE SODIUM 100 MCG PO TABS
100.0000 ug | ORAL_TABLET | ORAL | Status: DC
  Administered 2020-12-28 – 2020-12-30 (×2): 100 ug via ORAL
  Filled 2020-12-27 (×3): qty 1

## 2020-12-27 MED ORDER — ENOXAPARIN SODIUM 40 MG/0.4ML ~~LOC~~ SOLN
40.0000 mg | Freq: Every day | SUBCUTANEOUS | Status: DC
  Administered 2020-12-27 – 2020-12-31 (×5): 40 mg via SUBCUTANEOUS
  Filled 2020-12-27 (×5): qty 0.4

## 2020-12-27 MED ORDER — SODIUM CHLORIDE 0.9% FLUSH: 10.0000 mL | INTRAVENOUS | Status: DC | PRN

## 2020-12-27 MED ORDER — SODIUM CHLORIDE 0.9 % IV SOLN
Freq: Once | INTRAVENOUS | Status: AC
  Administered 2020-12-27: 8 mg via INTRAVENOUS
  Filled 2020-12-27: qty 4

## 2020-12-27 MED ORDER — MIRTAZAPINE 15 MG PO TABS
7.5000 mg | ORAL_TABLET | Freq: Every day | ORAL | Status: DC
  Administered 2020-12-27 – 2020-12-30 (×4): 7.5 mg via ORAL
  Filled 2020-12-27 (×4): qty 1

## 2020-12-27 MED ORDER — ENSURE ENLIVE PO LIQD
237.0000 mL | Freq: Two times a day (BID) | ORAL | Status: DC
  Administered 2020-12-27 – 2020-12-31 (×9): 237 mL via ORAL

## 2020-12-27 MED ORDER — LEVOTHYROXINE SODIUM 88 MCG PO TABS
88.0000 ug | ORAL_TABLET | ORAL | Status: DC
  Administered 2020-12-27 – 2020-12-31 (×3): 88 ug via ORAL
  Filled 2020-12-27 (×3): qty 1

## 2020-12-27 MED ORDER — ALBUTEROL SULFATE HFA 108 (90 BASE) MCG/ACT IN AERS
1.0000 | INHALATION_SPRAY | Freq: Four times a day (QID) | RESPIRATORY_TRACT | Status: DC | PRN
Start: 2020-12-27 — End: 2020-12-31
  Filled 2020-12-27: qty 6.7

## 2020-12-27 MED ORDER — LORATADINE 10 MG PO TABS
10.0000 mg | ORAL_TABLET | Freq: Every day | ORAL | Status: DC
  Administered 2020-12-27 – 2020-12-31 (×5): 10 mg via ORAL
  Filled 2020-12-27 (×5): qty 1

## 2020-12-27 MED ORDER — LIDOCAINE-PRILOCAINE 2.5-2.5 % EX CREA: 1.0000 "application " | TOPICAL_CREAM | CUTANEOUS | Status: DC | PRN

## 2020-12-27 MED ORDER — PREDNISONE 20 MG PO TABS
60.0000 mg | ORAL_TABLET | Freq: Every day | ORAL | Status: AC
  Administered 2020-12-27 – 2020-12-31 (×5): 60 mg via ORAL
  Filled 2020-12-27 (×5): qty 3

## 2020-12-27 MED ORDER — CALCIUM CARBONATE 1250 (500 CA) MG PO TABS
1250.0000 mg | ORAL_TABLET | Freq: Every day | ORAL | Status: DC
  Administered 2020-12-28 – 2020-12-31 (×4): 1250 mg via ORAL
  Filled 2020-12-27 (×5): qty 1

## 2020-12-27 MED ORDER — PANTOPRAZOLE SODIUM 40 MG PO TBEC
40.0000 mg | DELAYED_RELEASE_TABLET | Freq: Every day | ORAL | Status: DC
Start: 2020-12-27 — End: 2020-12-31
  Administered 2020-12-27 – 2020-12-31 (×5): 40 mg via ORAL
  Filled 2020-12-27 (×5): qty 1

## 2020-12-27 MED ORDER — PROCHLORPERAZINE MALEATE 10 MG PO TABS
10.0000 mg | ORAL_TABLET | Freq: Four times a day (QID) | ORAL | Status: DC | PRN
  Administered 2020-12-29 (×2): 10 mg via ORAL
  Filled 2020-12-27 (×2): qty 1

## 2020-12-27 MED ORDER — EZETIMIBE 10 MG PO TABS
10.0000 mg | ORAL_TABLET | Freq: Every day | ORAL | Status: DC
  Administered 2020-12-27 – 2020-12-31 (×5): 10 mg via ORAL
  Filled 2020-12-27 (×5): qty 1

## 2020-12-27 MED ORDER — MAGIC MOUTHWASH W/LIDOCAINE
5.0000 mL | Freq: Four times a day (QID) | ORAL | Status: DC | PRN
  Filled 2020-12-27: qty 5

## 2020-12-27 MED ORDER — VITAMIN D (ERGOCALCIFEROL) 1.25 MG (50000 UNIT) PO CAPS
50000.0000 [IU] | ORAL_CAPSULE | ORAL | Status: DC
  Administered 2020-12-31: 50000 [IU] via ORAL
  Filled 2020-12-27: qty 1

## 2020-12-27 MED ORDER — ADULT MULTIVITAMIN W/MINERALS CH
1.0000 | ORAL_TABLET | Freq: Every day | ORAL | Status: DC
  Administered 2020-12-27 – 2020-12-31 (×5): 1 via ORAL
  Filled 2020-12-27 (×5): qty 1

## 2020-12-27 MED ORDER — CHLORHEXIDINE GLUCONATE CLOTH 2 % EX PADS
6.0000 | MEDICATED_PAD | Freq: Every day | CUTANEOUS | Status: DC
  Administered 2020-12-27 – 2020-12-31 (×5): 6 via TOPICAL

## 2020-12-27 MED ORDER — FLUTICASONE PROPIONATE 50 MCG/ACT NA SUSP
1.0000 | Freq: Every day | NASAL | Status: DC
  Administered 2020-12-28 – 2020-12-31 (×4): 1 via NASAL
  Filled 2020-12-27: qty 16

## 2020-12-27 MED ORDER — VENLAFAXINE HCL ER 37.5 MG PO CP24
37.5000 mg | ORAL_CAPSULE | Freq: Every day | ORAL | Status: DC
  Administered 2020-12-27 – 2020-12-31 (×5): 37.5 mg via ORAL
  Filled 2020-12-27 (×5): qty 1

## 2020-12-27 MED ORDER — SENNOSIDES-DOCUSATE SODIUM 8.6-50 MG PO TABS
1.0000 | ORAL_TABLET | Freq: Two times a day (BID) | ORAL | Status: DC
  Administered 2020-12-27 – 2020-12-31 (×8): 1 via ORAL
  Filled 2020-12-27 (×8): qty 1

## 2020-12-27 MED ORDER — SODIUM CHLORIDE 0.9% FLUSH
10.0000 mL | Freq: Two times a day (BID) | INTRAVENOUS | Status: DC
  Administered 2020-12-27: 20 mL
  Administered 2020-12-28 – 2020-12-29 (×2): 10 mL
  Administered 2020-12-30 – 2020-12-31 (×2): 20 mL

## 2020-12-27 MED ORDER — LEVOCETIRIZINE DIHYDROCHLORIDE 5 MG PO TABS: 5.0000 mg | ORAL_TABLET | Freq: Every day | ORAL | Status: DC

## 2020-12-27 MED ORDER — ALUM & MAG HYDROXIDE-SIMETH 200-200-20 MG/5ML PO SUSP
30.0000 mL | ORAL | Status: DC | PRN
  Administered 2020-12-29 – 2020-12-31 (×3): 30 mL via ORAL
  Filled 2020-12-27 (×3): qty 30

## 2020-12-27 MED ORDER — HYDROCODONE-ACETAMINOPHEN 5-325 MG PO TABS: 1.0000 | ORAL_TABLET | Freq: Four times a day (QID) | ORAL | Status: DC | PRN

## 2020-12-27 MED ORDER — LORAZEPAM 0.5 MG PO TABS
0.5000 mg | ORAL_TABLET | Freq: Four times a day (QID) | ORAL | Status: DC | PRN
  Administered 2020-12-28 – 2020-12-30 (×4): 0.5 mg via ORAL
  Filled 2020-12-27 (×4): qty 1

## 2020-12-27 MED ORDER — MONTELUKAST SODIUM 10 MG PO TABS
10.0000 mg | ORAL_TABLET | Freq: Every day | ORAL | Status: DC
  Administered 2020-12-27 – 2020-12-31 (×5): 10 mg via ORAL
  Filled 2020-12-27 (×5): qty 1

## 2020-12-27 MED ORDER — SODIUM BICARBONATE/SODIUM CHLORIDE MOUTHWASH: 1.0000 "application " | Freq: Four times a day (QID) | OROMUCOSAL | Status: DC

## 2020-12-27 MED ORDER — POLYETHYLENE GLYCOL 3350 17 G PO PACK
17.0000 g | PACK | Freq: Every day | ORAL | Status: DC
  Administered 2020-12-28 – 2020-12-31 (×4): 17 g via ORAL
  Filled 2020-12-27 (×5): qty 1

## 2020-12-27 MED ORDER — PROCHLORPERAZINE 25 MG RE SUPP
25.0000 mg | Freq: Two times a day (BID) | RECTAL | Status: DC | PRN
  Filled 2020-12-27: qty 1

## 2020-12-27 MED ORDER — ATORVASTATIN CALCIUM 10 MG PO TABS
10.0000 mg | ORAL_TABLET | Freq: Every day | ORAL | Status: DC
  Administered 2020-12-27 – 2020-12-30 (×4): 10 mg via ORAL
  Filled 2020-12-27 (×4): qty 1

## 2020-12-27 NOTE — H&P (Addendum)
[place]  [phone] [phone]   MEDICAL ONCOLOGY - ADMISSION H&P  Reason for Admission: Cycle #3 EPOCH-R, Large B-Cell Lymphoma of the colon  HPI: Ms. 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  The patient has been being followed by Dr. Lindi Adie in our office for her breast cancer.  The patient had a CT of the abdomen/pelvis with contrast due to abdominal pain performed on 10/21/2020 which showed acute colitis/enteritis of the cecum/ascending colon and the associated terminal loops of ileum, acute inflammatory changes contribute to at least a partial small bowel obstruction, primary concern of this inflammatory cystic mass/bowel of the right lower quadrant is malignancy given that the posterior margin is inseparable from the psoas muscle, adnexa, and right ureter.  A colonoscopy was performed on 11/04/2020 which showed an infiltrative and ulcerated partially obstructing large mass was found at 85 cm proximal to the anus. The mass was circumferential.  This mass was biopsied and was consistent with large B-cell lymphoma.  Due to this new finding, the patient was referred to Dr. Irene Limbo for consideration of systemic chemotherapy for treatment of her B-cell lymphoma.  The patient is seen today prior to cycle #3 of her chemotherapy.  She reported that she had some stomach discomfort yesterday including nausea, vomiting, abdominal cramping, and diarrhea.  Symptoms are much better today but she still has some mild, intermittent abdominal cramping.  The  nausea, vomiting, diarrhea have resolved.  She is not aware of any sick contacts. She is able to eat and drink.  This morning, she denies any fevers, chills, chest pain, shortness of breath. Denies mucositis. The patient is seen today for admission prior to cycle #3 of EPOCH-R.      Past Medical History:  Diagnosis Date  . Allergy   . Anemia   . Anxiety   . Arthralgia 09/11/2011  . Asthma    cough variant asthma  . Breast cancer ([place]) 2011   RIGHT BREAST CA  . Breast cancer, left ([place]) 09/2020   left breast IDC  . Cataract   . 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 Depression   . GERD (gastroesophageal reflux disease)   . Hyperlipidemia   . Hypertension   . Hypothyroidism   . Insomnia   . Thyroid disease    hypothyroidism                                           . CATARACT EXTRACTION, BILATERAL    . COLONOSCOPY  2015  . COLONOSCOPY WITH PROPOFOL N/A 04/14/2014   Procedure: COLONOSCOPY WITH PROPOFOL;  Surgeon: Garlan Fair, MD;  Location: [facility];  Service: Endoscopy;  Laterality: N/A;  . IR IMAGING GUIDED PORT INSERTION  12/01/2020  . POLYPECTOMY    . ROTATOR CUFF REPAIR Left 04/2013   Dr. Para March         Allergies  Allergen Reactions  . Advair Diskus [Fluticasone-Salmeterol] Other (See Comments)    Hoarseness.  . Neosporin [Neomycin-Polymyxin-Gramicidin] Itching  Maternal Aunt        lung  . Cancer Paternal Uncle        colon, stomach  . Colon cancer Neg Hx   . Colon polyps Neg Hx   . Esophageal cancer Neg Hx   . Rectal cancer Neg Hx   . Stomach cancer Neg Hx      Socioeconomic History  . Marital status: Married    Spouse name: Not on file  . Number of children: Not on file  . Years of education: Not on file  . Highest education level: Not on file  Occupational History  . Not on file  Tobacco Use  . Smoking status: Former Smoker    Packs/day: 1.00    Years: 15.00    Pack years:  15.00    Types: Cigarettes    Quit date: 12/09/1978    Years since quitting: 42.0  . Smokeless tobacco: Never Used  Vaping Use  . Vaping Use: Never used  Substance and Sexual Activity  . Alcohol use: Yes    Alcohol/week: 7.0 standard drinks    Types: 7 Glasses of wine per week    Comment: SOCIALLY wine  . Drug use: No  . Sexual activity: Yes    Birth control/protection: Surgical  Other Topics Concern  . Not on file  Social History Narrative  . Not on file   Social Determinants of Health   Financial Resource Strain: Not on file  Food Insecurity: Not on file  Transportation Needs: Not on file  Physical Activity: Not on file  Stress: Not on file  Social Connections: Not on file  Intimate Partner Violence: Not At Risk  . Fear of Current or Ex-Partner: No  . Emotionally Abused: No  . Physically Abused: No  . Sexually Abused: No  :  Review of Systems: A comprehensive 14 point review of systems was negative except as noted in the HPI.  Exam: BP 121/65 (BP Location: Left Arm)   Pulse 98   Temp 98.2 F (36.8 C) (Oral)   Resp 18   SpO2 99%   General:  well-nourished in no acute distress.   Eyes:  no scleral icterus.   ENT:  There were no oropharyngeal lesions.    Lymphatics:  Negative cervical, supraclavicular or axillary adenopathy.   Respiratory: lungs were clear bilaterally without wheezing or crackles.   Cardiovascular:  Regular rate and rhythm, S1/S2, without murmur, rub or gallop.  There was no pedal edema.   GI:  abdomen was soft, flat, nontender, nondistended, without organomegaly.   Musculoskeletal:  no spinal tenderness of palpation of vertebral spine.   Skin exam was without echymosis, petichae.   Neuro exam was nonfocal. Patient was alert and oriented.  Attention was good.   Language was appropriate.  Mood was normal without depression.  Speech was not pressured.  Thought content was not tangential.     Lab Results  Component Value Date   WBC 11.1 (H)  12/24/2020   HGB 9.7 (L) 12/24/2020   HCT 31.0 (L) 12/24/2020   PLT 246 12/24/2020   GLUCOSE 90 12/24/2020   CHOL 94 (L) 12/19/2019   TRIG 54 12/19/2019   HDL 32 (L) 12/19/2019   LDLCALC 49 12/19/2019   ALT 18 12/24/2020   AST 19 12/24/2020   NA 139 12/24/2020   K 4.1 12/24/2020   CL 107 12/24/2020   CREATININE 0.65 12/24/2020   BUN 16 12/24/2020   CO2 24 12/24/2020    NM PET  Image Initial (PI) Skull Base To Thigh  Result Date: 12/18/2020 CLINICAL DATA:  Initial treatment strategy for new diagnosis of large B-cell lymphoma involving the distal small bowel and cecum. Chemotherapy including on 12/06/2020. EXAM: NUCLEAR MEDICINE PET SKULL BASE TO THIGH TECHNIQUE: 6.4 mCi F-18 FDG was injected intravenously. Full-ring PET imaging was performed from the skull base to thigh after the radiotracer. CT data was obtained and used for attenuation correction and anatomic localization. Fasting blood glucose: 103 mg/dl COMPARISON:  10/21/2020 abdominopelvic CT.  CTA chest of 02/12/2006. FINDINGS: Motion degraded CT images. Mediastinal blood pool activity: SUV max 1.4 Liver activity: SUV max 2.3 NECK: No areas of abnormal hypermetabolism. Incidental CT findings: No cervical adenopathy. Presumed sebaceous cyst about the posterior right neck at 8 mm on 12/04. CHEST: No pulmonary parenchymal or thoracic nodal hypermetabolism. Incidental CT findings:Mild aortic and lad coronary artery calcification. Right Port-A-Cath tip at mid right atrium. ABDOMEN/PELVIS: The right lower quadrant/ileocecal mass is less distinct today, given motion degradation in this area. Corresponds to hypermetabolism at a S.U.V. max of 6.7 including on 151/4. No abdominopelvic nodal hypermetabolism. The spleen is normal in size, mildly hypermetabolic relative to the liver. Incidental CT findings: Aortic atherosclerosis. Hysterectomy. Pelvic floor laxity. Large colonic stool burden. Mild hepatic steatosis. SKELETON: Diffuse moderate marrow  hypermetabolism, with an example area in the L4 vertebral body measuring a S.U.V. max of 9.0. Incidental CT findings: none IMPRESSION: 1. Hypermetabolism corresponding to the right lower quadrant, ileocolic mass consistent with the clinical history of bowel lymphoma. 2. Diffuse marrow hypermetabolism which could represent marrow stimulation by chemotherapy or marrow involvement by lymphoma. 3. Mild hypermetabolism of the spleen relative to the liver, nonspecific. No splenomegaly. 4. Incidental findings, including: Coronary artery atherosclerosis. Aortic Atherosclerosis (ICD10-I70.0). Hepatic steatosis. Possible constipation. 5. Motion degraded CT images. The right lower lobe pulmonary nodule described on prior CTs is not readily apparent but likely below PET resolution. Electronically Signed   By: Kyle  Talbot M.D.   On: 12/18/2020 18:07   IR IMAGING GUIDED PORT INSERTION  Result Date: 12/01/2020 CLINICAL DATA:  Left breast carcinoma and diffuse large B-cell lymphoma. The patient requires a porta cath to begin chemotherapy. EXAM: IMPLANTED PORT A CATH PLACEMENT WITH ULTRASOUND AND FLUOROSCOPIC GUIDANCE ANESTHESIA/SEDATION: 2.0 mg IV Versed; 100 mcg IV Fentanyl Total Moderate Sedation Time:  34 minutes The patient's level of consciousness and physiologic status were continuously monitored during the procedure by Radiology nursing. FLUOROSCOPY TIME:  24 seconds.  3.0 mGy. PROCEDURE: The procedure, risks, benefits, and alternatives were explained to the patient. Questions regarding the procedure were encouraged and answered. The patient understands and consents to the procedure. A time-out was performed prior to initiating the procedure. Ultrasound was utilized to confirm patency of the right internal jugular vein. The right neck and chest were prepped with chlorhexidine in a sterile fashion, and a sterile drape was applied covering the operative field. Maximum barrier sterile technique with sterile gowns and  gloves were used for the procedure. Local anesthesia was provided with 1% lidocaine. After creating a small venotomy incision, a 21 gauge needle was advanced into the right internal jugular vein under direct, real-time ultrasound guidance. Ultrasound image documentation was performed. After securing guidewire access, an 8 Fr dilator was placed. A J-wire was kinked to measure appropriate catheter length. A subcutaneous port pocket was then created along the upper chest wall utilizing sharp and blunt dissection. Portable cautery was utilized. The pocket was irrigated with sterile saline. A single lumen power injectable   port was chosen for placement. The 8 Fr catheter was tunneled from the port pocket site to the venotomy incision. The port was placed in the pocket. External catheter was trimmed to appropriate length based on guidewire measurement. At the venotomy, an 8 Fr peel-away sheath was placed over a guidewire. The catheter was then placed through the sheath and the sheath removed. Final catheter positioning was confirmed and documented with a fluoroscopic spot image. The port was accessed with a needle and aspirated and flushed with heparinized saline. The access needle was removed. The venotomy and port pocket incisions were closed with subcutaneous 3-0 Monocryl and subcuticular 4-0 Vicryl. Dermabond was applied to both incisions. COMPLICATIONS: COMPLICATIONS None FINDINGS: After catheter placement, the tip lies at the cavo-atrial junction. The catheter aspirates normally and is ready for immediate use. IMPRESSION: Placement of single lumen port a cath via right internal jugular vein. The catheter tip lies at the cavo-atrial junction. A power injectable port a cath was placed and is ready for immediate use. Electronically Signed   By: Aletta Edouard M.D.   On: 12/01/2020 11:20     NM PET Image Initial (PI) Skull Base To Thigh  Result Date: 12/18/2020 CLINICAL DATA:  Initial treatment strategy for new  diagnosis of large B-cell lymphoma involving the distal small bowel and cecum. Chemotherapy including on 12/06/2020. EXAM: NUCLEAR MEDICINE PET SKULL BASE TO THIGH TECHNIQUE: 6.4 mCi F-18 FDG was injected intravenously. Full-ring PET imaging was performed from the skull base to thigh after the radiotracer. CT data was obtained and used for attenuation correction and anatomic localization. Fasting blood glucose: 103 mg/dl COMPARISON:  10/21/2020 abdominopelvic CT.  CTA chest of 02/12/2006. FINDINGS: Motion degraded CT images. Mediastinal blood pool activity: SUV max 1.4 Liver activity: SUV max 2.3 NECK: No areas of abnormal hypermetabolism. Incidental CT findings: No cervical adenopathy. Presumed sebaceous cyst about the posterior right neck at 8 mm on 12/04. CHEST: No pulmonary parenchymal or thoracic nodal hypermetabolism. Incidental CT findings:Mild aortic and lad coronary artery calcification. Right Port-A-Cath tip at mid right atrium. ABDOMEN/PELVIS: The right lower quadrant/ileocecal mass is less distinct today, given motion degradation in this area. Corresponds to hypermetabolism at a S.U.V. max of 6.7 including on 151/4. No abdominopelvic nodal hypermetabolism. The spleen is normal in size, mildly hypermetabolic relative to the liver. Incidental CT findings: Aortic atherosclerosis. Hysterectomy. Pelvic floor laxity. Large colonic stool burden. Mild hepatic steatosis. SKELETON: Diffuse moderate marrow hypermetabolism, with an example area in the L4 vertebral body measuring a S.U.V. max of 9.0. Incidental CT findings: none IMPRESSION: 1. Hypermetabolism corresponding to the right lower quadrant, ileocolic mass consistent with the clinical history of bowel lymphoma. 2. Diffuse marrow hypermetabolism which could represent marrow stimulation by chemotherapy or marrow involvement by lymphoma. 3. Mild hypermetabolism of the spleen relative to the liver, nonspecific. No splenomegaly. 4. Incidental findings,  including: Coronary artery atherosclerosis. Aortic Atherosclerosis (ICD10-I70.0). Hepatic steatosis. Possible constipation. 5. Motion degraded CT images. The right lower lobe pulmonary nodule described on prior CTs is not readily apparent but likely below PET resolution. Electronically Signed   By: Abigail Miyamoto M.D.   On: 12/18/2020 18:07   IR IMAGING GUIDED PORT INSERTION  Result Date: 12/01/2020 CLINICAL DATA:  Left breast carcinoma and diffuse large B-cell lymphoma. The patient requires a porta cath to begin chemotherapy. EXAM: IMPLANTED PORT A CATH PLACEMENT WITH ULTRASOUND AND FLUOROSCOPIC GUIDANCE ANESTHESIA/SEDATION: 2.0 mg IV Versed; 100 mcg IV Fentanyl Total Moderate Sedation Time:  34 minutes The patient's level  of consciousness and physiologic status were continuously monitored during the procedure by Radiology nursing. FLUOROSCOPY TIME:  24 seconds.  3.0 mGy. PROCEDURE: The procedure, risks, benefits, and alternatives were explained to the patient. Questions regarding the procedure were encouraged and answered. The patient understands and consents to the procedure. A time-out was performed prior to initiating the procedure. Ultrasound was utilized to confirm patency of the right internal jugular vein. The right neck and chest were prepped with chlorhexidine in a sterile fashion, and a sterile drape was applied covering the operative field. Maximum barrier sterile technique with sterile gowns and gloves were used for the procedure. Local anesthesia was provided with 1% lidocaine. After creating a small venotomy incision, a 21 gauge needle was advanced into the right internal jugular vein under direct, real-time ultrasound guidance. Ultrasound image documentation was performed. After securing guidewire access, an 8 Fr dilator was placed. A J-wire was kinked to measure appropriate catheter length. A subcutaneous port pocket was then created along the upper chest wall utilizing sharp and blunt  dissection. Portable cautery was utilized. The pocket was irrigated with sterile saline. A single lumen power injectable port was chosen for placement. The 8 Fr catheter was tunneled from the port pocket site to the venotomy incision. The port was placed in the pocket. External catheter was trimmed to appropriate length based on guidewire measurement. At the venotomy, an 8 Fr peel-away sheath was placed over a guidewire. The catheter was then placed through the sheath and the sheath removed. Final catheter positioning was confirmed and documented with a fluoroscopic spot image. The port was accessed with a needle and aspirated and flushed with heparinized saline. The access needle was removed. The venotomy and port pocket incisions were closed with subcutaneous 3-0 Monocryl and subcuticular 4-0 Vicryl. Dermabond was applied to both incisions. COMPLICATIONS: COMPLICATIONS None FINDINGS: After catheter placement, the tip lies at the cavo-atrial junction. The catheter aspirates normally and is ready for immediate use. IMPRESSION: Placement of single lumen port a cath via right internal jugular vein. The catheter tip lies at the cavo-atrial junction. A power injectable port a cath was placed and is ready for immediate use. Electronically Signed   By: Aletta Edouard M.D.   On: 12/01/2020 11:20    Pathology:  Diagnosis Colon, biopsy - INVOLVEMENT BY LARGE B-CELL LYMPHOMA, SEE COMMENT. Microscopic Comment Fragments of colonic mucosa reveal effacement by a dense lymphoid infiltrate. There is crush artifact, but intact areas reveal sheets of large lymphoid cells with irregular nuclear contours and prominent nucleoli. The cells are positive for CD45, CD20, bcl-6, bcl-2, CD21, and MUM-1. They are negative for CD10, CD30, and EBV in situ hybridization. Ki-67 is elevated. CD3 and CD5 highlight scattered T-cells. The overall findings are consistent with involvement by a large B-cell lymphoma. The differential  includes a diffuse large B-cell lymphoma, activated B-cell type or an aggressive B-cell lymphoma (double hit lymphoma). FISH for MYC, BCL-2, and BCL-6 will be attempted and reported in an addendum. Dr. Silverio Decamp was notified on 11/10/2020. Vicente Males MD Pathologist, Electronic Signature (Case signed 11/10/2020)  Assessment and Plan:   1) Large B-cell lymphoma -CT abdomen/pelvis with contrast 10/21/2020 - "Acute colitis/enteritis of the cecum/ascending colon and the associated terminal loops of ileum. As was recently described on CT imaging, the anatomy of distal ileum, ileocecal valve, cecum is atypical and correlation with patient's prior surgical record would be useful. The acute inflammatory changes contribute to at least a partial small bowel obstruction. Primary concern of this  inflammatory cystic mass/bowel of the right lower quadrant is malignancy given that the posterior margin is inseparable from the psoas muscle, adnexa, and the right ureter. Correlation with CEA may be useful. Inflammatory/infectious changes are also a consideration." -Colonoscopy performed 11/04/2020- "An infiltrative and ulcerated partially obstructing large mass was found at 85 cm proximal to the anus. 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 --Patient initiated treatment with EPOCH on 11/15/2020. --PET scan from 12/17/2020 - "hypermetabolism corresponding to the right lower quadrant, ileocolic mass consistent with the clinical history of bowel lymphoma. Diffuse marrow hypermetabolism which could represent marrow stimulation by chemotherapy or marrow involvement by lymphoma. Mild hypermetabolism of the spleen relative to the liver,nonspecific. No splenomegaly.Incidental findings, including: Coronary artery atherosclerosis.Aortic Atherosclerosis (ICD10-I70.0). Hepatic steatosis. Possible constipation. Motion degraded CT images. The right lower lobe pulmonary nodule described  on prior CTs is not readily apparent but likely below PET Resolution.  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  She also has leukocytosis likely related to recent G-CSF.  Uric acid and LDH are normal.  Labs are adequate to proceed with day 1 of cycle #3 of her chemotherapy today as planned.  We will check a daily CBC with differential and CMET. -Continue as needed antiemetics. -MiraLAX and Senokot ordered for constipation. -Lovenox for DVT prophylaxis. -Continue home medications. -Outpatient rituximab and Neulasta scheduled for 01/03/2021 with labs and follow-up visit scheduled for 01/14/2021.  Mikey Bussing, DNP, AGPCNP-BC, AOCNP   ADDENDUM  .Patient was Personally and independently interviewed, examined and relevant elements of the history of present illness were reviewed in details and an assessment and plan was created. All elements of the patient's history of present illness , assessment and plan were discussed in details with Mikey Bussing, DNP, AGPCNP-BC, AOCNP. The above documentation reflects our combined findings assessment and plan. PET/CT results discussed. Labs stable Appropriate to proceed with C3 of EPOCH-R at std dose levels.  Sullivan Lone MD MS

## 2020-12-27 NOTE — Progress Notes (Signed)
Checked chemotherapy based on normal dosing of Adriamycin, Etoposide, and Vincristine.  Checked individually with Adela Ports based on patient's BSA.

## 2020-12-28 DIAGNOSIS — Z5111 Encounter for antineoplastic chemotherapy: Secondary | ICD-10-CM | POA: Diagnosis not present

## 2020-12-28 DIAGNOSIS — C8519 Unspecified B-cell lymphoma, extranodal and solid organ sites: Secondary | ICD-10-CM

## 2020-12-28 LAB — COMPREHENSIVE METABOLIC PANEL
ALT: 18 U/L (ref 0–44)
AST: 15 U/L (ref 15–41)
Albumin: 2.9 g/dL — ABNORMAL LOW (ref 3.5–5.0)
Alkaline Phosphatase: 76 U/L (ref 38–126)
Anion gap: 6 (ref 5–15)
BUN: 20 mg/dL (ref 8–23)
CO2: 23 mmol/L (ref 22–32)
Calcium: 8.3 mg/dL — ABNORMAL LOW (ref 8.9–10.3)
Chloride: 108 mmol/L (ref 98–111)
Creatinine, Ser: 0.68 mg/dL (ref 0.44–1.00)
GFR, Estimated: >60 mL/min (ref >60)
Glucose, Bld: 114 mg/dL — ABNORMAL HIGH (ref 70–99)
Potassium: 3.7 mmol/L (ref 3.5–5.1)
Sodium: 137 mmol/L (ref 135–145)
Total Bilirubin: 0.7 mg/dL (ref 0.3–1.2)
Total Protein: 5.4 g/dL — ABNORMAL LOW (ref 6.5–8.1)

## 2020-12-28 LAB — CBC WITH DIFFERENTIAL/PLATELET
Abs Immature Granulocytes: 0.14 10*3/uL — ABNORMAL HIGH (ref 0.00–0.07)
Basophils Absolute: 0 10*3/uL (ref 0.0–0.1)
Basophils Relative: 0 %
Eosinophils Absolute: 0 10*3/uL (ref 0.0–0.5)
Eosinophils Relative: 0 %
HCT: 28.2 % — ABNORMAL LOW (ref 36.0–46.0)
Hemoglobin: 8.8 g/dL — ABNORMAL LOW (ref 12.0–15.0)
Immature Granulocytes: 2 %
Lymphocytes Relative: 11 %
Lymphs Abs: 0.7 10*3/uL (ref 0.7–4.0)
MCH: 27.7 pg (ref 26.0–34.0)
MCHC: 31.2 g/dL (ref 30.0–36.0)
MCV: 88.7 fL (ref 80.0–100.0)
Monocytes Absolute: 0.7 10*3/uL (ref 0.1–1.0)
Monocytes Relative: 10 %
Neutro Abs: 5.5 10*3/uL (ref 1.7–7.7)
Neutrophils Relative %: 77 %
Platelets: 381 10*3/uL (ref 150–400)
RBC: 3.18 MIL/uL — ABNORMAL LOW (ref 3.87–5.11)
RDW: 20.3 % — ABNORMAL HIGH (ref 11.5–15.5)
WBC: 7.1 10*3/uL (ref 4.0–10.5)
nRBC: 0 % (ref 0.0–0.2)

## 2020-12-28 MED ORDER — SODIUM CHLORIDE 0.9 % IV SOLN
Freq: Once | INTRAVENOUS | Status: AC
  Administered 2020-12-28: 18 mg via INTRAVENOUS
  Filled 2020-12-28: qty 4

## 2020-12-28 MED ORDER — ETOPOSIDE CHEMO INJECTION 500 MG/25ML
Freq: Once | INTRAVENOUS | Status: AC
  Filled 2020-12-28: qty 8

## 2020-12-28 NOTE — Progress Notes (Addendum)
HEMATOLOGY-ONCOLOGY PROGRESS NOTE  SUBJECTIVE: The patient tolerated day 1 of cycle #3 of her chemotherapy well overall.  She has no complaints this morning.  Her abdominal cramping has resolved.  She is eating and drinking well. She denies mucositis, nausea, vomiting.  No constipation reported this morning.   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 Biopsy showed invasive and in situ ductal carcinoma, HER-2 equivocal by IHC (2+), negative by FISH (ratio 1.59), ER+ >95%, PR+ 85%, Ki67 10%.    Malignant neoplasm of left breast in female, estrogen receptor positive ([place])  10/12/2020 Initial Diagnosis   Malignant neoplasm of left breast in female, estrogen receptor positive ([place])   10/12/2020 Cancer Staging   Staging form: Breast, AJCC 8th Edition - Clinical stage from 10/12/2020: Stage IA (cT1b, cN0, cM0, G2, ER+, PR+, HER2-) - Signed by Nicholas Lose, MD on 10/15/2020 Stage prefix: Initial diagnosis   11/05/2020 Surgery   Left lumpectomy: Grade 1 IDC, 1.1 cm, intermediate grade DCIS, margins are negative, lymph node -0/1, ER greater than 95%, PR 85%, HER-2 negative, Ki-67 10%   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[place])  11/11/2020 Initial Diagnosis   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[place])   11/15/2020 -  Chemotherapy    Patient is on Treatment Plan: IP NON-HODGKINS LYMPHOMA EPOCH Q21D   Patient is on Antibody Plan: NON-HODGKINS LYMPHOMA RITUXIMAB Q21D    11/19/2020 -  Chemotherapy    Patient is on Treatment Plan: IP NON-HODGKINS LYMPHOMA EPOCH Q21D   Patient is on Antibody Plan:  NON-HODGKINS LYMPHOMA RITUXIMAB Q21D       REVIEW OF SYSTEMS:   Constitutional: Denies fevers, chills  Eyes: Denies blurriness of vision Ears, nose, mouth, throat, and face: Denies mucositis or sore throat Respiratory: Denies cough, dyspnea or wheezes Cardiovascular: Denies palpitation, chest discomfort Gastrointestinal:  Denies nausea, heartburn or change in bowel habits Skin: Denies abnormal skin rashes Lymphatics: Denies new lymphadenopathy or easy bruising Neurological: Denies dizziness and headache Behavioral/Psych: Mood is stable, no new changes  Extremities: No lower extremity edema All other systems were reviewed with the patient and are negative.  I have reviewed the past medical history, past surgical history, social history and family history with the patient and they are unchanged from previous note.   PHYSICAL EXAMINATION: ECOG PERFORMANCE STATUS: 1 - Symptomatic but completely ambulatory  Vitals:   12/27/20 2206 12/28/20 0430  BP: 116/70 119/70  Pulse: 84 78  Resp: 16 16  Temp: 98.2 F (36.8 C) 97.9 F (36.6 C)  SpO2: 95% 97%   There were no vitals filed for this visit.  Intake/Output from previous day: No intake/output data recorded.  GENERAL:alert, no distress and comfortable SKIN: skin color, texture, turgor are normal, no rashes or significant lesions EYES: normal, Conjunctiva are pink and non-injected, sclera clear OROPHARYNX:no exudate, no erythema and lips, buccal mucosa, and tongue normal  LUNGS: clear to auscultation and percussion with normal breathing effort HEART: regular rate & rhythm and no murmurs and no lower extremity edema ABDOMEN:abdomen soft, non-tender and normal bowel sounds Musculoskeletal:no cyanosis of digits and  no clubbing  NEURO: alert & oriented x 3 with fluent speech, no focal motor/sensory deficits  LABORATORY DATA:  I have reviewed the data as listed CMP Latest Ref Rng & Units 12/28/2020 12/27/2020 12/24/2020  Glucose 70  - 99 mg/dL 114(H) 128(H) 90  BUN 8 - 23 mg/dL $Remove'20 23 16  'uMkQjGS$ Creatinine 0.44 - 1.00 mg/dL 0.68 1.00 0.65  Sodium 135 - 145 mmol/L 137 138 139  Potassium 3.5 - 5.1 mmol/L 3.7 3.8 4.1  Chloride 98 - 111 mmol/L 108 104 107  CO2 22 - 32 mmol/L $RemoveB'23 26 24  'rFaSurtT$ Calcium 8.9 - 10.3 mg/dL 8.3(L) 8.8(L) 8.4(L)  Total Protein 6.5 - 8.1 g/dL 5.4(L) 6.8 5.9(L)  Total Bilirubin 0.3 - 1.2 mg/dL 0.7 0.7 0.4  Alkaline Phos 38 - 126 U/L 76 97 120  AST 15 - 41 U/L $Remo'15 19 19  'dhHYx$ ALT 0 - 44 U/L $Remo'18 19 18    'KPdyr$ Lab Results  Component Value Date   WBC 7.1 12/28/2020   HGB 8.8 (L) 12/28/2020   HCT 28.2 (L) 12/28/2020   MCV 88.7 12/28/2020   PLT 381 12/28/2020   NEUTROABS 5.5 12/28/2020    NM PET Image Initial (PI) Skull Base To Thigh  Result Date: 12/18/2020 CLINICAL DATA:  Initial treatment strategy for new diagnosis of large B-cell lymphoma involving the distal small bowel and cecum. Chemotherapy including on 12/06/2020. EXAM: NUCLEAR MEDICINE PET SKULL BASE TO THIGH TECHNIQUE: 6.4 mCi F-18 FDG was injected intravenously. Full-ring PET imaging was performed from the skull base to thigh after the radiotracer. CT data was obtained and used for attenuation correction and anatomic localization. Fasting blood glucose: 103 mg/dl COMPARISON:  10/21/2020 abdominopelvic CT.  CTA chest of 02/12/2006. FINDINGS: Motion degraded CT images. Mediastinal blood pool activity: SUV max 1.4 Liver activity: SUV max 2.3 NECK: No areas of abnormal hypermetabolism. Incidental CT findings: No cervical adenopathy. Presumed sebaceous cyst about the posterior right neck at 8 mm on 12/04. CHEST: No pulmonary parenchymal or thoracic nodal hypermetabolism. Incidental CT findings:Mild aortic and lad coronary artery calcification. Right Port-A-Cath tip at mid right atrium. ABDOMEN/PELVIS: The right lower quadrant/ileocecal mass is less distinct today, given motion degradation in this area. Corresponds to hypermetabolism at a S.U.V. max of 6.7 including on 151/4.  No abdominopelvic nodal hypermetabolism. The spleen is normal in size, mildly hypermetabolic relative to the liver. Incidental CT findings: Aortic atherosclerosis. Hysterectomy. Pelvic floor laxity. Large colonic stool burden. Mild hepatic steatosis. SKELETON: Diffuse moderate marrow hypermetabolism, with an example area in the L4 vertebral body measuring a S.U.V. max of 9.0. Incidental CT findings: none IMPRESSION: 1. Hypermetabolism corresponding to the right lower quadrant, ileocolic mass consistent with the clinical history of bowel lymphoma. 2. Diffuse marrow hypermetabolism which could represent marrow stimulation by chemotherapy or marrow involvement by lymphoma. 3. Mild hypermetabolism of the spleen relative to the liver, nonspecific. No splenomegaly. 4. Incidental findings, including: Coronary artery atherosclerosis. Aortic Atherosclerosis (ICD10-I70.0). Hepatic steatosis. Possible constipation. 5. Motion degraded CT images. The right lower lobe pulmonary nodule described on prior CTs is not readily apparent but likely below PET resolution. Electronically Signed   By: Abigail Miyamoto M.D.   On: 12/18/2020 18:07   IR IMAGING GUIDED PORT INSERTION  Result Date: 12/01/2020 CLINICAL DATA:  Left breast carcinoma and diffuse large B-cell lymphoma. The patient requires a porta cath to begin chemotherapy. EXAM: IMPLANTED PORT A CATH PLACEMENT WITH ULTRASOUND AND FLUOROSCOPIC GUIDANCE ANESTHESIA/SEDATION: 2.0 mg IV Versed; 100 mcg IV Fentanyl Total Moderate Sedation  Time:  34 minutes The patient's level of consciousness and physiologic status were continuously monitored during the procedure by Radiology nursing. FLUOROSCOPY TIME:  24 seconds.  3.0 mGy. PROCEDURE: The procedure, risks, benefits, and alternatives were explained to the patient. Questions regarding the procedure were encouraged and answered. The patient understands and consents to the procedure. A time-out was performed prior to initiating the  procedure. Ultrasound was utilized to confirm patency of the right internal jugular vein. The right neck and chest were prepped with chlorhexidine in a sterile fashion, and a sterile drape was applied covering the operative field. Maximum barrier sterile technique with sterile gowns and gloves were used for the procedure. Local anesthesia was provided with 1% lidocaine. After creating a small venotomy incision, a 21 gauge needle was advanced into the right internal jugular vein under direct, real-time ultrasound guidance. Ultrasound image documentation was performed. After securing guidewire access, an 8 Fr dilator was placed. A J-wire was kinked to measure appropriate catheter length. A subcutaneous port pocket was then created along the upper chest wall utilizing sharp and blunt dissection. Portable cautery was utilized. The pocket was irrigated with sterile saline. A single lumen power injectable port was chosen for placement. The 8 Fr catheter was tunneled from the port pocket site to the venotomy incision. The port was placed in the pocket. External catheter was trimmed to appropriate length based on guidewire measurement. At the venotomy, an 8 Fr peel-away sheath was placed over a guidewire. The catheter was then placed through the sheath and the sheath removed. Final catheter positioning was confirmed and documented with a fluoroscopic spot image. The port was accessed with a needle and aspirated and flushed with heparinized saline. The access needle was removed. The venotomy and port pocket incisions were closed with subcutaneous 3-0 Monocryl and subcuticular 4-0 Vicryl. Dermabond was applied to both incisions. COMPLICATIONS: COMPLICATIONS None FINDINGS: After catheter placement, the tip lies at the cavo-atrial junction. The catheter aspirates normally and is ready for immediate use. IMPRESSION: Placement of single lumen port a cath via right internal jugular vein. The catheter tip lies at the cavo-atrial  junction. A power injectable port a cath was placed and is ready for immediate use. Electronically Signed   By: Aletta Edouard M.D.   On: 12/01/2020 11:20    ASSESSMENT AND PLAN: 1) large B-cell lymphoma -CT abdomen/pelvis with contrast 10/21/2020 - "Acute colitis/enteritis of the cecum/ascending colon and the associated terminal loops of ileum. As was recently described on CT imaging, the anatomy of distal ileum, ileocecal valve, cecum is atypical and correlation with patient's prior surgical record would be useful. The acute inflammatory changes contribute to at least a partial small bowel obstruction. Primary concern of this inflammatory cystic mass/bowel of the right lower quadrant is malignancy given that the posterior margin is inseparable from the psoas muscle, adnexa, and the right ureter. Correlation with CEA may be useful. Inflammatory/infectious changes are also a consideration." -Colonoscopy performed 11/04/2020- "An infiltrative and ulcerated partially obstructing large mass was found at 85 cm proximal to the anus. 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  However, hemoglobin from yesterday was likely hemoconcentrated due to GI symptoms this past Sunday and she was likely slightly dehydrated.  Hemoglobin this  morning is most consistent with her prior hemoglobins in our office.  She has no bleeding.  The remainder of her labs remained stable.  Labs are adequate to proceed with day 2 of cycle #3 of her chemotherapy today as planned. -Will monitor daily CBC with differential and CMET. -Continue as needed antiemetics. -Continue MiraLAX and Senokot-S. -Lovenox for DVT prophylaxis. -Continue home medications. -Continue PPI and as needed Maalox for reflux symptoms. -Outpatient rituximab and Neulasta scheduled for 01/03/2021 with labs and follow-up visit scheduled for 01/14/2021.   LOS: 1 day   Mikey Bussing, DNP, AGPCNP-BC, AOCNP 12/28/20  ADDENDUM  .Patient was Personally and independently interviewed, examined and relevant elements of the history of present illness were reviewed in details and an assessment and plan was created. All elements of the patient's history of present illness , assessment and plan were discussed in details with Mikey Bussing, DNP, AGPCNP-BC, AOCNP. The above documentation reflects our combined findings assessment and plan.  Sullivan Lone MD MS

## 2020-12-29 DIAGNOSIS — Z5111 Encounter for antineoplastic chemotherapy: Secondary | ICD-10-CM | POA: Diagnosis not present

## 2020-12-29 DIAGNOSIS — C8519 Unspecified B-cell lymphoma, extranodal and solid organ sites: Secondary | ICD-10-CM | POA: Diagnosis not present

## 2020-12-29 LAB — COMPREHENSIVE METABOLIC PANEL
ALT: 15 U/L (ref 0–44)
AST: 13 U/L — ABNORMAL LOW (ref 15–41)
Albumin: 2.7 g/dL — ABNORMAL LOW (ref 3.5–5.0)
Alkaline Phosphatase: 76 U/L (ref 38–126)
Anion gap: 6 (ref 5–15)
BUN: 21 mg/dL (ref 8–23)
CO2: 24 mmol/L (ref 22–32)
Calcium: 8.4 mg/dL — ABNORMAL LOW (ref 8.9–10.3)
Chloride: 111 mmol/L (ref 98–111)
Creatinine, Ser: 0.65 mg/dL (ref 0.44–1.00)
GFR, Estimated: >60 mL/min (ref >60)
Glucose, Bld: 97 mg/dL (ref 70–99)
Potassium: 3.5 mmol/L (ref 3.5–5.1)
Sodium: 141 mmol/L (ref 135–145)
Total Bilirubin: 0.4 mg/dL (ref 0.3–1.2)
Total Protein: 5.1 g/dL — ABNORMAL LOW (ref 6.5–8.1)

## 2020-12-29 LAB — CBC WITH DIFFERENTIAL/PLATELET
Abs Immature Granulocytes: 0.14 10*3/uL — ABNORMAL HIGH (ref 0.00–0.07)
Basophils Absolute: 0 10*3/uL (ref 0.0–0.1)
Basophils Relative: 0 %
Eosinophils Absolute: 0 10*3/uL (ref 0.0–0.5)
Eosinophils Relative: 0 %
HCT: 26.7 % — ABNORMAL LOW (ref 36.0–46.0)
Hemoglobin: 8.5 g/dL — ABNORMAL LOW (ref 12.0–15.0)
Immature Granulocytes: 2 %
Lymphocytes Relative: 9 %
Lymphs Abs: 0.8 10*3/uL (ref 0.7–4.0)
MCH: 28.3 pg (ref 26.0–34.0)
MCHC: 31.8 g/dL (ref 30.0–36.0)
MCV: 89 fL (ref 80.0–100.0)
Monocytes Absolute: 0.9 10*3/uL (ref 0.1–1.0)
Monocytes Relative: 10 %
Neutro Abs: 6.9 10*3/uL (ref 1.7–7.7)
Neutrophils Relative %: 79 %
Platelets: 372 10*3/uL (ref 150–400)
RBC: 3 MIL/uL — ABNORMAL LOW (ref 3.87–5.11)
RDW: 20.3 % — ABNORMAL HIGH (ref 11.5–15.5)
WBC: 8.7 10*3/uL (ref 4.0–10.5)
nRBC: 0 % (ref 0.0–0.2)

## 2020-12-29 MED ORDER — ETOPOSIDE CHEMO INJECTION 500 MG/25ML
Freq: Once | INTRAVENOUS | Status: AC
  Filled 2020-12-29: qty 8

## 2020-12-29 MED ORDER — SODIUM BICARBONATE/SODIUM CHLORIDE MOUTHWASH
OROMUCOSAL | Status: DC | PRN
  Filled 2020-12-29: qty 1000

## 2020-12-29 MED ORDER — ONDANSETRON HCL 40 MG/20ML IJ SOLN
Freq: Once | INTRAMUSCULAR | Status: AC
  Administered 2020-12-29: 18 mg via INTRAVENOUS
  Filled 2020-12-29: qty 4

## 2020-12-29 NOTE — Progress Notes (Signed)
HEMATOLOGY-ONCOLOGY PROGRESS NOTE  SUBJECTIVE:  Patient tolerated cycle 3-day 2 of chemotherapy without any issues.  She is currently receiving cycle 3-day 3 of chemotherapy.  No acute issues noted some grade 1 fatigue and some stomach bloating.  No nausea or vomiting. No fevers or chills.  No shortness of breath or chest pain.  No leg swelling. No other acute new symptoms. Feeling a little less hungry today.  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 Biopsy showed invasive and in situ ductal carcinoma, HER-2 equivocal by IHC (2+), negative by FISH (ratio 1.59), ER+ >95%, PR+ 85%, Ki67 10%.    Malignant neoplasm of left breast in female, estrogen receptor positive ([place])  10/12/2020 Initial Diagnosis   Malignant neoplasm of left breast in female, estrogen receptor positive ([place])   10/12/2020 Cancer Staging   Staging form: Breast, AJCC 8th Edition - Clinical stage from 10/12/2020: Stage IA (cT1b, cN0, cM0, G2, ER+, PR+, HER2-) - Signed by Nicholas Lose, MD on 10/15/2020 Stage prefix: Initial diagnosis   11/05/2020 Surgery   Left lumpectomy: Grade 1 IDC, 1.1 cm, intermediate grade DCIS, margins are negative, lymph node -0/1, ER greater than 95%, PR 85%, HER-2 negative, Ki-67 10%   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[place])  11/11/2020 Initial Diagnosis   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[place])   11/15/2020 -  Chemotherapy    Patient is on Treatment Plan: IP NON-HODGKINS LYMPHOMA EPOCH Q21D   Patient is on Antibody Plan: NON-HODGKINS LYMPHOMA RITUXIMAB Q21D    11/19/2020 -  Chemotherapy     Patient is on Treatment Plan: IP NON-HODGKINS LYMPHOMA EPOCH Q21D   Patient is on Antibody Plan: NON-HODGKINS LYMPHOMA RITUXIMAB Q21D       REVIEW OF SYSTEMS:   Constitutional: Denies fevers, chills  Eyes: Denies blurriness of vision Ears, nose, mouth, throat, and face: Denies mucositis or sore throat Respiratory: Denies cough, dyspnea or wheezes Cardiovascular: Denies palpitation, chest discomfort Gastrointestinal:  Denies nausea, heartburn or change in bowel habits Skin: Denies abnormal skin rashes Lymphatics: Denies new lymphadenopathy or easy bruising Neurological: Denies dizziness and headache Behavioral/Psych: Mood is stable, no new changes  Extremities: No lower extremity edema All other systems were reviewed with the patient and are negative.  I have reviewed the past medical history, past surgical history, social history and family history with the patient and they are unchanged from previous note.   PHYSICAL EXAMINATION: ECOG PERFORMANCE STATUS: 1 - Symptomatic but completely ambulatory  Vitals:   12/28/20 2216 12/29/20 0441  BP: 131/69 118/66  Pulse: 72 73  Resp: 14 14  Temp: 97.9 F (36.6 C) 98 F (36.7 C)  SpO2: 99% 95%   Filed Weights   12/29/20 0441  Weight: 133 lb 6.1 oz (60.5 kg)    Intake/Output from previous day: 04/26 0701 - 04/27 0700 In: 360 [P.O.:360] Out: -   GENERAL:alert, no distress and comfortable SKIN: skin color, texture, turgor are normal, no rashes or significant lesions EYES: normal, Conjunctiva are pink and non-injected, sclera clear OROPHARYNX:no exudate, no erythema and lips, buccal mucosa, and tongue normal  LUNGS: clear to auscultation and percussion with normal breathing effort HEART: regular rate & rhythm and no murmurs and no lower extremity edema ABDOMEN:abdomen soft, non-tender and normal bowel sounds Musculoskeletal:no cyanosis of digits and no clubbing  NEURO: alert & oriented x 3 with fluent speech, no focal  motor/sensory deficits  LABORATORY DATA:  I have reviewed the data as listed CMP Latest Ref Rng & Units 12/29/2020 12/28/2020 12/27/2020  Glucose 70 - 99 mg/dL 97 114(H) 128(H)  BUN 8 - 23 mg/dL _0 Creatinine 0.44 - 1.00 mg/dL 0.65 0.68 1.00  Sodium 135 - 145 mmol/L 141 137 138  Potassium 3.5 - 5.1 mmol/L 3.5 3.7 3.8  Chloride 98 - 111 mmol/L 111 108 104  CO2 22 - 32 mmol/L _1 Calcium 8.9 - 10.3 mg/dL 8.4(L) 8.3(L) 8.8(L)  Total Protein 6.5 - 8.1 g/dL 5.1(L) 5.4(L) 6.8  Total Bilirubin 0.3 - 1.2 mg/dL 0.4 0.7 0.7  Alkaline Phos 38 - 126 U/L 76 76 97  AST 15 - 41 U/L 13(L) 15 19  ALT 0 - 44 U/L _2 . CBC Latest Ref Rng & Units 12/29/2020 12/28/2020 12/27/2020  WBC 4.0 - 10.5 K/uL 8.7 7.1 14.0(H)  Hemoglobin 12.0 - 15.0 g/dL 8.5(L) 8.8(L) 11.5(L)  Hematocrit 36.0 - 46.0 % 26.7(L) 28.2(L) 36.9  Platelets 150 - 400 K/uL 372 381 524(H)     NM PET Image Initial (PI) Skull Base To Thigh  Result Date: 12/18/2020 CLINICAL DATA:  Initial treatment strategy for new diagnosis of large B-cell lymphoma involving the distal small bowel and cecum. Chemotherapy including on 12/06/2020. EXAM: NUCLEAR MEDICINE PET SKULL BASE TO THIGH TECHNIQUE: 6.4 mCi F-18 FDG was injected intravenously. Full-ring PET imaging was performed from the skull base to thigh after the radiotracer. CT data was obtained and used for attenuation correction and anatomic localization. Fasting blood glucose: 103 mg/dl COMPARISON:  10/21/2020 abdominopelvic CT.  CTA chest of 02/12/2006. FINDINGS: Motion degraded CT images. Mediastinal blood pool activity: SUV max 1.4 Liver activity: SUV max 2.3 NECK: No areas of abnormal hypermetabolism. Incidental CT findings: No cervical adenopathy. Presumed sebaceous cyst about the posterior right neck at 8 mm on 12/04. CHEST: No pulmonary parenchymal or thoracic nodal hypermetabolism. Incidental CT findings:Mild aortic and lad coronary artery calcification. Right Port-A-Cath tip at  mid right atrium. ABDOMEN/PELVIS: The right lower quadrant/ileocecal mass is less distinct today, given motion degradation in this area. Corresponds to hypermetabolism at a S.U.V. max of 6.7 including on 151/4. No abdominopelvic nodal hypermetabolism. The spleen is normal in size, mildly hypermetabolic relative to the liver. Incidental CT findings: Aortic atherosclerosis. Hysterectomy. Pelvic floor laxity. Large colonic stool burden. Mild hepatic steatosis. SKELETON: Diffuse moderate marrow hypermetabolism, with an example area in the L4 vertebral body measuring a S.U.V. max of 9.0. Incidental CT findings: none IMPRESSION: 1. Hypermetabolism corresponding to the right lower quadrant, ileocolic mass consistent with the clinical history of bowel lymphoma. 2. Diffuse marrow hypermetabolism which could represent marrow stimulation by chemotherapy or marrow involvement by lymphoma. 3. Mild hypermetabolism of the spleen relative to the liver, nonspecific. No splenomegaly. 4. Incidental findings, including: Coronary artery atherosclerosis. Aortic Atherosclerosis (ICD10-I70.0). Hepatic steatosis. Possible constipation. 5. Motion degraded CT images. The right lower lobe pulmonary nodule described on prior CTs is not readily apparent but likely below PET resolution. Electronically Signed   By: Abigail Miyamoto M.D.   On: 12/18/2020 18:07   IR IMAGING GUIDED PORT INSERTION  Result Date: 12/01/2020  CLINICAL DATA:  Left breast carcinoma and diffuse large B-cell lymphoma. The patient requires a porta cath to begin chemotherapy. EXAM: IMPLANTED PORT A CATH PLACEMENT WITH ULTRASOUND AND FLUOROSCOPIC GUIDANCE ANESTHESIA/SEDATION: 2.0 mg IV Versed; 100 mcg IV Fentanyl Total Moderate Sedation Time:  34 minutes The patient's level of consciousness and physiologic status were continuously monitored during the procedure by Radiology nursing. FLUOROSCOPY TIME:  24 seconds.  3.0 mGy. PROCEDURE: The procedure, risks, benefits, and  alternatives were explained to the patient. Questions regarding the procedure were encouraged and answered. The patient understands and consents to the procedure. A time-out was performed prior to initiating the procedure. Ultrasound was utilized to confirm patency of the right internal jugular vein. The right neck and chest were prepped with chlorhexidine in a sterile fashion, and a sterile drape was applied covering the operative field. Maximum barrier sterile technique with sterile gowns and gloves were used for the procedure. Local anesthesia was provided with 1% lidocaine. After creating a small venotomy incision, a 21 gauge needle was advanced into the right internal jugular vein under direct, real-time ultrasound guidance. Ultrasound image documentation was performed. After securing guidewire access, an 8 Fr dilator was placed. A J-wire was kinked to measure appropriate catheter length. A subcutaneous port pocket was then created along the upper chest wall utilizing sharp and blunt dissection. Portable cautery was utilized. The pocket was irrigated with sterile saline. A single lumen power injectable port was chosen for placement. The 8 Fr catheter was tunneled from the port pocket site to the venotomy incision. The port was placed in the pocket. External catheter was trimmed to appropriate length based on guidewire measurement. At the venotomy, an 8 Fr peel-away sheath was placed over a guidewire. The catheter was then placed through the sheath and the sheath removed. Final catheter positioning was confirmed and documented with a fluoroscopic spot image. The port was accessed with a needle and aspirated and flushed with heparinized saline. The access needle was removed. The venotomy and port pocket incisions were closed with subcutaneous 3-0 Monocryl and subcuticular 4-0 Vicryl. Dermabond was applied to both incisions. COMPLICATIONS: COMPLICATIONS None FINDINGS: After catheter placement, the tip lies at the  cavo-atrial junction. The catheter aspirates normally and is ready for immediate use. IMPRESSION: Placement of single lumen port a cath via right internal jugular vein. The catheter tip lies at the cavo-atrial junction. A power injectable port a cath was placed and is ready for immediate use. Electronically Signed   By: Aletta Edouard M.D.   On: 12/01/2020 11:20    ASSESSMENT AND PLAN: 1) large B-cell lymphoma -CT abdomen/pelvis with contrast 10/21/2020 - "Acute colitis/enteritis of the cecum/ascending colon and the associated terminal loops of ileum. As was recently described on CT imaging, the anatomy of distal ileum, ileocecal valve, cecum is atypical and correlation with patient's prior surgical record would be useful. The acute inflammatory changes contribute to at least a partial small bowel obstruction. Primary concern of this inflammatory cystic mass/bowel of the right lower quadrant is malignancy given that the posterior margin is inseparable from the psoas muscle, adnexa, and the right ureter. Correlation with CEA may be useful. Inflammatory/infectious changes are also a consideration." -Colonoscopy performed 11/04/2020- "An infiltrative and ulcerated partially obstructing large mass was found at 85 cm proximal to the anus. 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  Initial admission labs showed hemoglobin of 11.5 which was likely a hemoconcentrated sample.  No evidence of bleeding. -No acute chemotherapy related toxicities labs are adequate to proceed with day 3 of cycle #3 of her chemotherapy today as planned. -Will monitor daily CBC with differential and CMET. -Continue as needed antiemetics. -Continue MiraLAX and Senokot-S for vincristine related constipation. -Lovenox for DVT prophylaxis. -Continue home medications. -Continue PPI and as needed Maalox for reflux and dyspeptic symptoms. -Outpatient rituximab and Neulasta scheduled for 01/03/2021 with labs and follow-up visit scheduled for 01/14/2021. -Patient in good spirits and doing well overall   LOS: 2 days   Sullivan Lone,

## 2020-12-30 DIAGNOSIS — C8510 Unspecified B-cell lymphoma, unspecified site: Secondary | ICD-10-CM | POA: Diagnosis not present

## 2020-12-30 DIAGNOSIS — Z5111 Encounter for antineoplastic chemotherapy: Secondary | ICD-10-CM | POA: Diagnosis not present

## 2020-12-30 LAB — CBC WITH DIFFERENTIAL/PLATELET
Abs Immature Granulocytes: 0.04 10*3/uL (ref 0.00–0.07)
Basophils Absolute: 0 10*3/uL (ref 0.0–0.1)
Basophils Relative: 0 %
Eosinophils Absolute: 0 10*3/uL (ref 0.0–0.5)
Eosinophils Relative: 0 %
HCT: 27.1 % — ABNORMAL LOW (ref 36.0–46.0)
Hemoglobin: 8.8 g/dL — ABNORMAL LOW (ref 12.0–15.0)
Immature Granulocytes: 1 %
Lymphocytes Relative: 17 %
Lymphs Abs: 0.8 10*3/uL (ref 0.7–4.0)
MCH: 28 pg (ref 26.0–34.0)
MCHC: 32.5 g/dL (ref 30.0–36.0)
MCV: 86.3 fL (ref 80.0–100.0)
Monocytes Absolute: 0.4 10*3/uL (ref 0.1–1.0)
Monocytes Relative: 8 %
Neutro Abs: 3.3 10*3/uL (ref 1.7–7.7)
Neutrophils Relative %: 74 %
Platelets: 391 10*3/uL (ref 150–400)
RBC: 3.14 MIL/uL — ABNORMAL LOW (ref 3.87–5.11)
RDW: 20 % — ABNORMAL HIGH (ref 11.5–15.5)
WBC: 4.4 10*3/uL (ref 4.0–10.5)
nRBC: 0 % (ref 0.0–0.2)

## 2020-12-30 LAB — COMPREHENSIVE METABOLIC PANEL
ALT: 15 U/L (ref 0–44)
AST: 13 U/L — ABNORMAL LOW (ref 15–41)
Albumin: 2.7 g/dL — ABNORMAL LOW (ref 3.5–5.0)
Alkaline Phosphatase: 71 U/L (ref 38–126)
Anion gap: 5 (ref 5–15)
BUN: 17 mg/dL (ref 8–23)
CO2: 25 mmol/L (ref 22–32)
Calcium: 8.4 mg/dL — ABNORMAL LOW (ref 8.9–10.3)
Chloride: 110 mmol/L (ref 98–111)
Creatinine, Ser: 0.53 mg/dL (ref 0.44–1.00)
GFR, Estimated: >60 mL/min (ref >60)
Glucose, Bld: 86 mg/dL (ref 70–99)
Potassium: 3.5 mmol/L (ref 3.5–5.1)
Sodium: 140 mmol/L (ref 135–145)
Total Bilirubin: 0.4 mg/dL (ref 0.3–1.2)
Total Protein: 5.1 g/dL — ABNORMAL LOW (ref 6.5–8.1)

## 2020-12-30 MED ORDER — ONDANSETRON HCL 40 MG/20ML IJ SOLN
Freq: Once | INTRAMUSCULAR | Status: AC
  Administered 2020-12-30: 8 mg via INTRAVENOUS
  Filled 2020-12-30: qty 4

## 2020-12-30 MED ORDER — SODIUM CHLORIDE 0.9 % IV SOLN
Freq: Once | INTRAVENOUS | Status: AC
  Administered 2020-12-31: 20 mg via INTRAVENOUS
  Filled 2020-12-30: qty 8

## 2020-12-30 MED ORDER — VINCRISTINE SULFATE CHEMO INJECTION 1 MG/ML
Freq: Once | INTRAVENOUS | Status: AC
  Filled 2020-12-30: qty 8

## 2020-12-30 MED ORDER — SODIUM CHLORIDE 0.9 % IV SOLN
750.0000 mg/m2 | Freq: Once | INTRAVENOUS | Status: AC
  Administered 2020-12-31: 1220 mg via INTRAVENOUS
  Filled 2020-12-30: qty 61

## 2020-12-30 MED ORDER — HYDROCODONE-ACETAMINOPHEN 5-325 MG PO TABS: 1.0000 | ORAL_TABLET | Freq: Four times a day (QID) | ORAL | 0 refills | Status: DC | PRN

## 2020-12-30 NOTE — Progress Notes (Addendum)
HEMATOLOGY-ONCOLOGY PROGRESS NOTE  SUBJECTIVE:  Patient tolerated cycle 3-day 3 of chemotherapy moderately well.  She reports some abdominal cramping yesterday and some stomach bloating.  The symptoms have now resolved.  She had a good breakfast this morning.  No nausea or vomiting.  Bowels are moving. No fevers or chills.  No shortness of breath or chest pain.  No leg swelling. No other acute new symptoms.  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 Biopsy showed invasive and in situ ductal carcinoma, HER-2 equivocal by IHC (2+), negative by FISH (ratio 1.59), ER+ >95%, PR+ 85%, Ki67 10%.    Malignant neoplasm of left breast in female, estrogen receptor positive ([place])  10/12/2020 Initial Diagnosis   Malignant neoplasm of left breast in female, estrogen receptor positive ([place])   10/12/2020 Cancer Staging   Staging form: Breast, AJCC 8th Edition - Clinical stage from 10/12/2020: Stage IA (cT1b, cN0, cM0, G2, ER+, PR+, HER2-) - Signed by Nicholas Lose, MD on 10/15/2020 Stage prefix: Initial diagnosis   11/05/2020 Surgery   Left lumpectomy: Grade 1 IDC, 1.1 cm, intermediate grade DCIS, margins are negative, lymph node -0/1, ER greater than 95%, PR 85%, HER-2 negative, Ki-67 10%   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[place])  11/11/2020 Initial Diagnosis   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[place])   11/15/2020 -  Chemotherapy    Patient is on Treatment Plan: IP NON-HODGKINS LYMPHOMA EPOCH Q21D   Patient is on Antibody Plan: NON-HODGKINS LYMPHOMA RITUXIMAB Q21D    11/19/2020 -  Chemotherapy     Patient is on Treatment Plan: IP NON-HODGKINS LYMPHOMA EPOCH Q21D   Patient is on Antibody Plan: NON-HODGKINS LYMPHOMA RITUXIMAB Q21D       REVIEW OF SYSTEMS:   Constitutional: Denies fevers, chills  Eyes: Denies blurriness of vision Ears, nose, mouth, throat, and face: Denies mucositis or sore throat Respiratory: Denies cough, dyspnea or wheezes Cardiovascular: Denies palpitation, chest discomfort Gastrointestinal:  Denies nausea, heartburn or change in bowel habits Skin: Denies abnormal skin rashes Lymphatics: Denies new lymphadenopathy or easy bruising Neurological: Denies dizziness and headache Behavioral/Psych: Mood is stable, no new changes  Extremities: No lower extremity edema All other systems were reviewed with the patient and are negative.  I have reviewed the past medical history, past surgical history, social history and family history with the patient and they are unchanged from previous note.   PHYSICAL EXAMINATION: ECOG PERFORMANCE STATUS: 1 - Symptomatic but completely ambulatory  Vitals:   12/30/20 0457 12/30/20 0839  BP: 127/78 130/67  Pulse: 64 75  Resp: 18 17  Temp: 97.6 F (36.4 C) 97.7 F (36.5 C)  SpO2: 97% 98%   Filed Weights   12/29/20 0441  Weight: 60.5 kg    Intake/Output from previous day: 04/27 0701 - 04/28 0700 In: 638.9 [P.O.:240; I.V.:160; IV Piggyback:238.9] Out: -   GENERAL:alert, no distress and comfortable SKIN: skin color, texture, turgor are normal, no rashes or significant lesions EYES: normal, Conjunctiva are pink and non-injected, sclera clear OROPHARYNX:no exudate, no erythema and lips, buccal mucosa, and tongue normal  LUNGS:  clear to auscultation and percussion with normal breathing effort HEART: regular rate & rhythm and no murmurs and no lower extremity edema ABDOMEN:abdomen soft, non-tender and normal bowel sounds Musculoskeletal:no cyanosis of digits and no clubbing  NEURO: alert & oriented x 3 with fluent speech,  no focal motor/sensory deficits  LABORATORY DATA:  I have reviewed the data as listed CMP Latest Ref Rng & Units 12/30/2020 12/29/2020 12/28/2020  Glucose 70 - 99 mg/dL 86 97 114(H)  BUN 8 - 23 mg/dL $Remove'17 21 20  'nsKMrzc$ Creatinine 0.44 - 1.00 mg/dL 0.53 0.65 0.68  Sodium 135 - 145 mmol/L 140 141 137  Potassium 3.5 - 5.1 mmol/L 3.5 3.5 3.7  Chloride 98 - 111 mmol/L 110 111 108  CO2 22 - 32 mmol/L $RemoveB'25 24 23  'jSITPQCr$ Calcium 8.9 - 10.3 mg/dL 8.4(L) 8.4(L) 8.3(L)  Total Protein 6.5 - 8.1 g/dL 5.1(L) 5.1(L) 5.4(L)  Total Bilirubin 0.3 - 1.2 mg/dL 0.4 0.4 0.7  Alkaline Phos 38 - 126 U/L 71 76 76  AST 15 - 41 U/L 13(L) 13(L) 15  ALT 0 - 44 U/L $Remo'15 15 18   'fprpd$ . CBC Latest Ref Rng & Units 12/30/2020 12/29/2020 12/28/2020  WBC 4.0 - 10.5 K/uL 4.4 8.7 7.1  Hemoglobin 12.0 - 15.0 g/dL 8.8(L) 8.5(L) 8.8(L)  Hematocrit 36.0 - 46.0 % 27.1(L) 26.7(L) 28.2(L)  Platelets 150 - 400 K/uL 391 372 381     NM PET Image Initial (PI) Skull Base To Thigh  Result Date: 12/18/2020 CLINICAL DATA:  Initial treatment strategy for new diagnosis of large B-cell lymphoma involving the distal small bowel and cecum. Chemotherapy including on 12/06/2020. EXAM: NUCLEAR MEDICINE PET SKULL BASE TO THIGH TECHNIQUE: 6.4 mCi F-18 FDG was injected intravenously. Full-ring PET imaging was performed from the skull base to thigh after the radiotracer. CT data was obtained and used for attenuation correction and anatomic localization. Fasting blood glucose: 103 mg/dl COMPARISON:  10/21/2020 abdominopelvic CT.  CTA chest of 02/12/2006. FINDINGS: Motion degraded CT images. Mediastinal blood pool activity: SUV max 1.4 Liver activity: SUV max 2.3 NECK: No areas of abnormal hypermetabolism. Incidental CT findings: No cervical adenopathy. Presumed sebaceous cyst about the posterior right neck at 8 mm on 12/04. CHEST: No pulmonary parenchymal or thoracic nodal hypermetabolism. Incidental CT findings:Mild aortic and lad coronary artery calcification. Right Port-A-Cath  tip at mid right atrium. ABDOMEN/PELVIS: The right lower quadrant/ileocecal mass is less distinct today, given motion degradation in this area. Corresponds to hypermetabolism at a S.U.V. max of 6.7 including on 151/4. No abdominopelvic nodal hypermetabolism. The spleen is normal in size, mildly hypermetabolic relative to the liver. Incidental CT findings: Aortic atherosclerosis. Hysterectomy. Pelvic floor laxity. Large colonic stool burden. Mild hepatic steatosis. SKELETON: Diffuse moderate marrow hypermetabolism, with an example area in the L4 vertebral body measuring a S.U.V. max of 9.0. Incidental CT findings: none IMPRESSION: 1. Hypermetabolism corresponding to the right lower quadrant, ileocolic mass consistent with the clinical history of bowel lymphoma. 2. Diffuse marrow hypermetabolism which could represent marrow stimulation by chemotherapy or marrow involvement by lymphoma. 3. Mild hypermetabolism of the spleen relative to the liver, nonspecific. No splenomegaly. 4. Incidental findings, including: Coronary artery atherosclerosis. Aortic Atherosclerosis (ICD10-I70.0). Hepatic steatosis. Possible constipation. 5. Motion degraded CT images. The right lower lobe pulmonary nodule described on prior CTs is not readily apparent but likely below PET resolution. Electronically Signed   By: Abigail Miyamoto M.D.   On: 12/18/2020 18:07   IR IMAGING GUIDED PORT INSERTION  Result Date: 12/01/2020 CLINICAL  DATA:  Left breast carcinoma and diffuse large B-cell lymphoma. The patient requires a porta cath to begin chemotherapy. EXAM: IMPLANTED PORT A CATH PLACEMENT WITH ULTRASOUND AND FLUOROSCOPIC GUIDANCE ANESTHESIA/SEDATION: 2.0 mg IV Versed; 100 mcg IV Fentanyl Total Moderate Sedation Time:  34 minutes The patient's level of consciousness and physiologic status were continuously monitored during the procedure by Radiology nursing. FLUOROSCOPY TIME:  24 seconds.  3.0 mGy. PROCEDURE: The procedure, risks, benefits, and  alternatives were explained to the patient. Questions regarding the procedure were encouraged and answered. The patient understands and consents to the procedure. A time-out was performed prior to initiating the procedure. Ultrasound was utilized to confirm patency of the right internal jugular vein. The right neck and chest were prepped with chlorhexidine in a sterile fashion, and a sterile drape was applied covering the operative field. Maximum barrier sterile technique with sterile gowns and gloves were used for the procedure. Local anesthesia was provided with 1% lidocaine. After creating a small venotomy incision, a 21 gauge needle was advanced into the right internal jugular vein under direct, real-time ultrasound guidance. Ultrasound image documentation was performed. After securing guidewire access, an 8 Fr dilator was placed. A J-wire was kinked to measure appropriate catheter length. A subcutaneous port pocket was then created along the upper chest wall utilizing sharp and blunt dissection. Portable cautery was utilized. The pocket was irrigated with sterile saline. A single lumen power injectable port was chosen for placement. The 8 Fr catheter was tunneled from the port pocket site to the venotomy incision. The port was placed in the pocket. External catheter was trimmed to appropriate length based on guidewire measurement. At the venotomy, an 8 Fr peel-away sheath was placed over a guidewire. The catheter was then placed through the sheath and the sheath removed. Final catheter positioning was confirmed and documented with a fluoroscopic spot image. The port was accessed with a needle and aspirated and flushed with heparinized saline. The access needle was removed. The venotomy and port pocket incisions were closed with subcutaneous 3-0 Monocryl and subcuticular 4-0 Vicryl. Dermabond was applied to both incisions. COMPLICATIONS: COMPLICATIONS None FINDINGS: After catheter placement, the tip lies at the  cavo-atrial junction. The catheter aspirates normally and is ready for immediate use. IMPRESSION: Placement of single lumen port a cath via right internal jugular vein. The catheter tip lies at the cavo-atrial junction. A power injectable port a cath was placed and is ready for immediate use. Electronically Signed   By: Aletta Edouard M.D.   On: 12/01/2020 11:20    ASSESSMENT AND PLAN: 1) large B-cell lymphoma -CT abdomen/pelvis with contrast 10/21/2020 - "Acute colitis/enteritis of the cecum/ascending colon and the associated terminal loops of ileum. As was recently described on CT imaging, the anatomy of distal ileum, ileocecal valve, cecum is atypical and correlation with patient's prior surgical record would be useful. The acute inflammatory changes contribute to at least a partial small bowel obstruction. Primary concern of this inflammatory cystic mass/bowel of the right lower quadrant is malignancy given that the posterior margin is inseparable from the psoas muscle, adnexa, and the right ureter. Correlation with CEA may be useful. Inflammatory/infectious changes are also a consideration." -Colonoscopy performed 11/04/2020- "An infiltrative and ulcerated partially obstructing large mass was found at 85 cm proximal to the anus. 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 -No acute chemotherapy related toxicities labs are adequate to proceed with day 4 of cycle #3 of her chemotherapy today as planned. -Will monitor daily CBC with differential and CMET. -Continue as needed antiemetics. -Continue MiraLAX and Senokot-S for vincristine related constipation. -Lovenox for DVT prophylaxis. -Continue home medications. -Continue PPI and as needed Maalox for reflux and dyspeptic symptoms. -Outpatient rituximab and Neulasta scheduled for 01/03/2021 with labs and follow-up visit scheduled for 01/14/2021. -Patient in good spirits and doing well overall -Anticipate hospital discharge on 12/31/2020.   LOS: 3 days   Mikey Bussing    ADDENDUM  .Patient was Personally and independently interviewed, examined and relevant elements of the history of present illness were reviewed in details and an assessment and plan was created. All elements of the patient's history of present illness , assessment and plan were discussed in details with Mikey Bussing DNP. The above documentation reflects our combined findings assessment and plan.  Sullivan Lone MD MS

## 2020-12-30 NOTE — Care Management Important Message (Signed)
Important Message  Patient Details IM Letter given to the Patient. Name: Lindsey Price MRN: [id number] Date of Birth: 1946/01/22   Medicare Important Message Given:  Yes     Kerin Salen 12/30/2020, 10:53 AM

## 2020-12-31 DIAGNOSIS — Z5111 Encounter for antineoplastic chemotherapy: Secondary | ICD-10-CM | POA: Diagnosis not present

## 2020-12-31 DIAGNOSIS — D500 Iron deficiency anemia secondary to blood loss (chronic): Secondary | ICD-10-CM

## 2020-12-31 DIAGNOSIS — D649 Anemia, unspecified: Secondary | ICD-10-CM

## 2020-12-31 DIAGNOSIS — C8510 Unspecified B-cell lymphoma, unspecified site: Secondary | ICD-10-CM | POA: Diagnosis not present

## 2020-12-31 LAB — CBC WITH DIFFERENTIAL/PLATELET
Abs Immature Granulocytes: 0.03 10*3/uL (ref 0.00–0.07)
Basophils Absolute: 0 10*3/uL (ref 0.0–0.1)
Basophils Relative: 0 %
Eosinophils Absolute: 0 10*3/uL (ref 0.0–0.5)
Eosinophils Relative: 1 %
HCT: 24.6 % — ABNORMAL LOW (ref 36.0–46.0)
Hemoglobin: 7.9 g/dL — ABNORMAL LOW (ref 12.0–15.0)
Immature Granulocytes: 1 %
Lymphocytes Relative: 18 %
Lymphs Abs: 0.7 10*3/uL (ref 0.7–4.0)
MCH: 27.6 pg (ref 26.0–34.0)
MCHC: 32.1 g/dL (ref 30.0–36.0)
MCV: 86 fL (ref 80.0–100.0)
Monocytes Absolute: 0.1 10*3/uL (ref 0.1–1.0)
Monocytes Relative: 3 %
Neutro Abs: 3.1 10*3/uL (ref 1.7–7.7)
Neutrophils Relative %: 77 %
Platelets: 352 10*3/uL (ref 150–400)
RBC: 2.86 MIL/uL — ABNORMAL LOW (ref 3.87–5.11)
RDW: 19.3 % — ABNORMAL HIGH (ref 11.5–15.5)
WBC: 3.9 10*3/uL — ABNORMAL LOW (ref 4.0–10.5)
nRBC: 0 % (ref 0.0–0.2)

## 2020-12-31 LAB — COMPREHENSIVE METABOLIC PANEL
ALT: 15 U/L (ref 0–44)
AST: 14 U/L — ABNORMAL LOW (ref 15–41)
Albumin: 2.6 g/dL — ABNORMAL LOW (ref 3.5–5.0)
Alkaline Phosphatase: 70 U/L (ref 38–126)
Anion gap: 5 (ref 5–15)
BUN: 21 mg/dL (ref 8–23)
CO2: 26 mmol/L (ref 22–32)
Calcium: 8.3 mg/dL — ABNORMAL LOW (ref 8.9–10.3)
Chloride: 111 mmol/L (ref 98–111)
Creatinine, Ser: 0.61 mg/dL (ref 0.44–1.00)
GFR, Estimated: >60 mL/min (ref >60)
Glucose, Bld: 83 mg/dL (ref 70–99)
Potassium: 3.4 mmol/L — ABNORMAL LOW (ref 3.5–5.1)
Sodium: 142 mmol/L (ref 135–145)
Total Bilirubin: 0.6 mg/dL (ref 0.3–1.2)
Total Protein: 4.8 g/dL — ABNORMAL LOW (ref 6.5–8.1)

## 2020-12-31 LAB — PREPARE RBC (CROSSMATCH)

## 2020-12-31 MED ORDER — POTASSIUM CHLORIDE CRYS ER 20 MEQ PO TBCR
20.0000 meq | EXTENDED_RELEASE_TABLET | Freq: Once | ORAL | Status: AC
  Administered 2020-12-31: 20 meq via ORAL
  Filled 2020-12-31: qty 1

## 2020-12-31 MED ORDER — SODIUM CHLORIDE 0.9% IV SOLUTION: Freq: Once | INTRAVENOUS | Status: DC

## 2020-12-31 MED ORDER — HEPARIN SOD (PORK) LOCK FLUSH 100 UNIT/ML IV SOLN
500.0000 [IU] | INTRAVENOUS | Status: DC | PRN
  Filled 2020-12-31: qty 5

## 2020-12-31 NOTE — Discharge Summary (Addendum)
Discharge Summary  Patient ID: Lindsey Price MRN: [id number] DOB/AGE: 1945-10-17 75 y.o.  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[place])   Encounter for antineoplastic chemotherapy  Discharged Condition: good  Discharge Labs:   CBC    Component Value Date/Time   WBC 3.9 (L) 12/31/2020 0540   RBC 2.86 (L) 12/31/2020 0540   HGB 7.9 (L) 12/31/2020 0540   HGB 9.7 (L) 12/24/2020 0853   HGB 13.7 02/06/2019 0908   HGB 14.6 05/15/2014 0908   HCT 24.6 (L) 12/31/2020 0540   HCT 41.3 02/06/2019 0908   HCT 44.9 05/15/2014 0908   PLT 352 12/31/2020 0540   PLT 246 12/24/2020 0853   PLT 322 02/06/2019 0908   MCV 86.0 12/31/2020 0540   MCV 91 02/06/2019 0908   MCV 95.6 05/15/2014 0908   MCH 27.6 12/31/2020 0540   MCHC 32.1 12/31/2020 0540   RDW 19.3 (H) 12/31/2020 0540   RDW 13.1 02/06/2019 0908   RDW 12.7 05/15/2014 0908   LYMPHSABS 0.7 12/31/2020 0540   LYMPHSABS 1.3 02/06/2019 0908   LYMPHSABS 1.5 05/15/2014 0908   MONOABS 0.1 12/31/2020 0540   MONOABS 0.5 05/15/2014 0908   EOSABS 0.0 12/31/2020 0540   EOSABS 0.2 02/06/2019 0908   EOSABS 0.1 05/27/2010 1030   BASOSABS 0.0 12/31/2020 0540   BASOSABS 0.0 02/06/2019 0908   BASOSABS 0.0 05/15/2014 0908   CMP Latest Ref Rng & Units 12/31/2020 12/30/2020 12/29/2020  Glucose 70 - 99 mg/dL 83 86 97  BUN 8 - 23 mg/dL $Remove'21 17 21  'FvxljlZ$ Creatinine 0.44 - 1.00 mg/dL 0.61 0.53 0.65  Sodium 135 - 145 mmol/L 142 140 141  Potassium 3.5 - 5.1 mmol/L 3.4(L) 3.5 3.5  Chloride 98 - 111 mmol/L 111 110 111  CO2 22 - 32 mmol/L $RemoveB'26 25 24  'LUWVwTgC$ Calcium 8.9 - 10.3 mg/dL 8.3(L) 8.4(L) 8.4(L)  Total Protein 6.5 - 8.1 g/dL 4.8(L) 5.1(L) 5.1(L)  Total Bilirubin 0.3 - 1.2 mg/dL 0.6 0.4 0.4  Alkaline Phos 38 - 126 U/L 70 71 76  AST 15 - 41 U/L 14(L) 13(L) 13(L)  ALT 0 - 44 U/L $Remo'15 15 15    'NutEM$ Significant Diagnostic Studies: None  Consults: None  Procedures: None  Disposition:   Discharge disposition: 01-Home or Self Care      Allergies as of 12/31/2020      Reactions   Advair Diskus [fluticasone-salmeterol] Other (See Comments)   Hoarseness.   Neosporin [neomycin-polymyxin-gramicidin] Itching      Medication List    TAKE these medications   albuterol 108 (90 Base) MCG/ACT inhaler Commonly known as: ProAir HFA Inhale 1-2 puffs into the lungs every 6 (six) hours as needed for wheezing or shortness of breath.   alum & mag hydroxide-simeth 200-200-20 MG/5ML suspension Commonly known as: MAALOX/MYLANTA Take 30 mLs by mouth every 4 (four) hours as needed for indigestion.   atorvastatin 10 MG tablet Commonly known as: LIPITOR Take 1 tablet (10 mg total) by mouth at bedtime.   calcium carbonate 1250 (500 Ca) MG tablet Commonly known as: OS-CAL - dosed in mg of elemental calcium Take 1,250 mg by mouth daily.   cyanocobalamin 1000 MCG/ML injection Commonly known as: (VITAMIN B-12) Inject 1 mL (1,000 mcg total) into the skin every 30 (thirty) days.   dexamethasone 4 MG tablet Commonly known as: DECADRON Take 2 tablets (8 mg total) by mouth 2 (two) times daily with a meal. Take two times  a day starting the day after chemotherapy for 3 days.   esomeprazole 20 MG capsule Commonly known as: NEXIUM Take 20 mg by mouth daily at 12 noon.   ezetimibe 10 MG tablet Commonly known as: Zetia Take 1 tablet (10 mg total) by mouth daily.   famotidine 20 MG tablet Commonly known as: PEPCID TAKE 1 TABLET (20 MG TOTAL) BY MOUTH 2 (TWO) TIMES DAILY AS NEEDED FOR HEARTBURN OR INDIGESTION.   feeding supplement Liqd Take 237 mLs by mouth 2 (two) times daily between meals.   fluticasone 50 MCG/ACT nasal spray Commonly known as: FLONASE PLACE 1 SPRAY INTO BOTH NOSTRILS DAILY.   HYDROcodone-acetaminophen 5-325 MG tablet Commonly known as: NORCO/VICODIN Take 1-2 tablets by mouth every 6 (six) hours as needed for moderate pain or severe pain.   levocetirizine 5 MG  tablet Commonly known as: XYZAL Take 5 mg by mouth daily.   levothyroxine 88 MCG tablet Commonly known as: SYNTHROID * What changed:   how much to take  how to take this  when to take this  additional instructions   levothyroxine 100 MCG tablet Commonly known as: SYNTHROID TAKE 1 TABLET EVERY OTHER DAY ALTERNATING WITH 88 MCG TABLET What changed:   how much to take  how to take this  when to take this  additional instructions   lidocaine-prilocaine cream Commonly known as: EMLA Apply 1 application topically as needed.   LORazepam 0.5 MG tablet Commonly known as: Ativan Take 1 tablet (0.5 mg total) by mouth every 6 (six) hours as needed (Nausea or vomiting).   magic mouthwash w/lidocaine Soln Take 5 mLs by mouth 4 (four) times daily as needed for mouth pain. Swish and swallow 1 part diphenhydramine 12.5 mg per 5 mL elixir (35ml), 1 part Maalox (do not substitute Kaopectate) 55ml, 1 part 2% viscous lidocaine (40 ml), 1 part nystatin 500000 units/ml (35ml) and 1 part distilled water (64ml). Plz dispense 200 ml   mirtazapine 7.5 MG tablet Commonly known as: REMERON TAKE 1 TABLET BY MOUTH AT BEDTIME.   montelukast 10 MG tablet Commonly known as: SINGULAIR * What changed:   how much to take  how to take this  when to take this  additional instructions   multivitamin with minerals Tabs tablet Take 1 tablet by mouth daily.   ondansetron 8 MG tablet Commonly known as: Zofran Take 1 tablet (8 mg total) by mouth 2 (two) times daily. Take two times a day starting the day after chemo for 3 days. Then take two times a day as needed for nausea or vomiting. What changed: when to take this   polyethylene glycol 17 g packet Commonly known as: MIRALAX / GLYCOLAX Take 17 g by mouth daily.   prochlorperazine 10 MG tablet Commonly known as: COMPAZINE Take 1 tablet (10 mg total) by mouth every 6 (six) hours as needed (Nausea or vomiting).   prochlorperazine 25 MG  suppository Commonly known as: COMPAZINE Place 1 suppository (25 mg total) rectally every 12 (twelve) hours as needed for nausea.   senna-docusate 8.6-50 MG tablet Commonly known as: Senokot-S Take 1 tablet by mouth 2 (two) times daily.   sodium bicarbonate/sodium chloride Soln 1 application by Mouth Rinse route as needed for dry mouth.   venlafaxine XR 37.5 MG 24 hr capsule Commonly known as: EFFEXOR-XR TAKE 1 CAPSULE BY MOUTH DAILY WITH BREAKFAST.   Vitamin D (Ergocalciferol) 1.25 MG (50000 UNIT) Caps capsule Commonly known as: DRISDOL TAKE 1 CAPSULE BY MOUTH ONCE WEEKLY What  changed:   how much to take  how to take this  when to take this  additional instructions         HPI: Ms. 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  The patient has been being followed by Dr. Lindi Adie in our office for her breast cancer.  The patient had a CT of the abdomen/pelvis with contrast due to abdominal pain performed on 10/21/2020 which showed acute colitis/enteritis of the cecum/ascending colon and the associated terminal loops of ileum, acute inflammatory changes contribute to at least a partial small bowel obstruction, primary concern of this inflammatory cystic mass/bowel of the right lower quadrant is malignancy given that the posterior margin is inseparable from the psoas muscle, adnexa, and right ureter.  A colonoscopy was performed on 11/04/2020 which showed an infiltrative and ulcerated partially obstructing large mass was found at 85 cm proximal to the anus. The mass was  circumferential.  This mass was biopsied and was consistent with large B-cell lymphoma.  Due to this new finding, the patient was referred to Dr. Irene Limbo for consideration of systemic chemotherapy for treatment of her B-cell lymphoma.  Hospital Course: The patient was admitted on 12/27/2020 to begin cycle #3 of EPOCH-R.  On the day of admission, she reported having abdominal cramping, nausea, vomiting and diarrhea the day prior to admission.  On day of admission, she still had some mild abdominal cramping but her other symptoms have resolved. She was not having any issues with fevers, chills, headaches, dizziness, chest pain, shortness of breath, mucositis, bleeding.  Labs in the day of admission were adequate for treatment.  She started day 1 of cycle #3 of her chemotherapy as planned on 12/27/2020.  During the hospital admission, she tolerated chemotherapy well with no significant side effects.  She was seen on the morning of admission and was not having any issues with mucositis, nausea, vomiting, constipation.  Her exam remained unchanged.  Labs from the morning of discharge were reviewed.  Her hemoglobin is down to 7.9 and will give 1 unit PRBCs prior to discharge.  Her potassium is 3.4 this morning and will give a one-time dose of K-Dur 20 mEq by mouth.  The patient is stable for discharge once she receives a dose of potassium and her blood transfusion is completed.  She will follow-up in our office on 01/03/2021 for Rituximab and Neulasta and will have labs and a follow-up visit on 01/14/2021.   Signed: Mikey Bussing 12/31/2020, 8:53 AM   ADDENDUM  .Patient was Personally and independently interviewed, examined and relevant elements of the discharge plan were reviewed in details with Mikey Bussing  DNP. The above documentation reflects our combined findings assessment and plan.  Sullivan Lone MD MS TT>30 mins

## 2021-01-02 LAB — TYPE AND SCREEN
ABO/RH(D): O POS
Antibody Screen: NEGATIVE
Unit division: 0

## 2021-01-02 LAB — BPAM RBC
Blood Product Expiration Date: 202205232359
ISSUE DATE / TIME: 202204291155
Unit Type and Rh: 5100

## 2021-01-03 ENCOUNTER — Other Ambulatory Visit: Payer: Self-pay

## 2021-01-03 ENCOUNTER — Inpatient Hospital Stay: Payer: PPO | Attending: Hematology

## 2021-01-03 VITALS — BP 135/74 | HR 65 | Temp 97.9°F | Resp 18

## 2021-01-03 DIAGNOSIS — Z7189 Other specified counseling: Secondary | ICD-10-CM | POA: Diagnosis not present

## 2021-01-03 DIAGNOSIS — C8519 Unspecified B-cell lymphoma, extranodal and solid organ sites: Secondary | ICD-10-CM | POA: Diagnosis not present

## 2021-01-03 DIAGNOSIS — Z5112 Encounter for antineoplastic immunotherapy: Secondary | ICD-10-CM | POA: Insufficient documentation

## 2021-01-03 DIAGNOSIS — C8510 Unspecified B-cell lymphoma, unspecified site: Secondary | ICD-10-CM

## 2021-01-03 MED ORDER — FAMOTIDINE IN NACL 20-0.9 MG/50ML-% IV SOLN
20.0000 mg | Freq: Once | INTRAVENOUS | Status: AC
  Administered 2021-01-03: 20 mg via INTRAVENOUS

## 2021-01-03 MED ORDER — DIPHENHYDRAMINE HCL 25 MG PO CAPS
50.0000 mg | ORAL_CAPSULE | Freq: Once | ORAL | Status: AC
  Administered 2021-01-03: 50 mg via ORAL

## 2021-01-03 MED ORDER — FAMOTIDINE IN NACL 20-0.9 MG/50ML-% IV SOLN
INTRAVENOUS | Status: AC
  Filled 2021-01-03: qty 50

## 2021-01-03 MED ORDER — ACETAMINOPHEN 325 MG PO TABS
650.0000 mg | ORAL_TABLET | Freq: Once | ORAL | Status: AC
  Administered 2021-01-03: 650 mg via ORAL

## 2021-01-03 MED ORDER — ACETAMINOPHEN 325 MG PO TABS
ORAL_TABLET | ORAL | Status: AC
  Filled 2021-01-03: qty 2

## 2021-01-03 MED ORDER — PEGFILGRASTIM-CBQV 6 MG/0.6ML ~~LOC~~ SOSY
PREFILLED_SYRINGE | SUBCUTANEOUS | Status: AC
  Filled 2021-01-03: qty 0.6

## 2021-01-03 MED ORDER — HEPARIN SOD (PORK) LOCK FLUSH 100 UNIT/ML IV SOLN
500.0000 [IU] | Freq: Once | INTRAVENOUS | Status: AC
  Administered 2021-01-03: 500 [IU] via INTRAVENOUS
  Filled 2021-01-03: qty 5

## 2021-01-03 MED ORDER — METHYLPREDNISOLONE SODIUM SUCC 125 MG IJ SOLR
INTRAMUSCULAR | Status: AC
  Filled 2021-01-03: qty 2

## 2021-01-03 MED ORDER — METHYLPREDNISOLONE SODIUM SUCC 125 MG IJ SOLR
60.0000 mg | Freq: Every day | INTRAMUSCULAR | Status: DC
  Administered 2021-01-03: 60 mg via INTRAVENOUS

## 2021-01-03 MED ORDER — DIPHENHYDRAMINE HCL 25 MG PO CAPS
ORAL_CAPSULE | ORAL | Status: AC
  Filled 2021-01-03: qty 2

## 2021-01-03 MED ORDER — SODIUM CHLORIDE 0.9 % IV SOLN
Freq: Once | INTRAVENOUS | Status: AC
  Filled 2021-01-03: qty 250

## 2021-01-03 MED ORDER — SODIUM CHLORIDE 0.9% FLUSH
10.0000 mL | Freq: Once | INTRAVENOUS | Status: AC
  Administered 2021-01-03: 10 mL via INTRAVENOUS
  Filled 2021-01-03: qty 10

## 2021-01-03 MED ORDER — PEGFILGRASTIM-CBQV 6 MG/0.6ML ~~LOC~~ SOSY
6.0000 mg | PREFILLED_SYRINGE | Freq: Once | SUBCUTANEOUS | Status: AC
  Administered 2021-01-03: 6 mg via SUBCUTANEOUS

## 2021-01-03 MED ORDER — SODIUM CHLORIDE 0.9 % IV SOLN
375.0000 mg/m2 | Freq: Once | INTRAVENOUS | Status: AC
  Administered 2021-01-03: 600 mg via INTRAVENOUS
  Filled 2021-01-03: qty 50

## 2021-01-03 NOTE — Patient Instructions (Signed)
[facility] MEDICAL ONCOLOGY  Discharge Instructions: Thank you for choosing [facility] to provide your oncology and hematology care.   If you have a lab appointment with the Cancer Center, please go directly to the Cancer Center and check in at the registration area.   Wear comfortable clothing and clothing appropriate for easy access to any Portacath or PICC line.   We strive to give you quality time with your provider. You may need to reschedule your appointment if you arrive late (15 or more minutes).  Arriving late affects you and other patients whose appointments are after yours.  Also, if you miss three or more appointments without notifying the office, you may be dismissed from the clinic at the provider's discretion.      For prescription refill requests, have your pharmacy contact our office and allow 72 hours for refills to be completed.    Today you received the following chemotherapy and/or immunotherapy agents rituxan      To help prevent nausea and vomiting after your treatment, we encourage you to take your nausea medication as directed.  BELOW ARE SYMPTOMS THAT SHOULD BE REPORTED IMMEDIATELY: *FEVER GREATER THAN 100.4 F (38 C) OR HIGHER *CHILLS OR SWEATING *NAUSEA AND VOMITING THAT IS NOT CONTROLLED WITH YOUR NAUSEA MEDICATION *UNUSUAL SHORTNESS OF BREATH *UNUSUAL BRUISING OR BLEEDING *URINARY PROBLEMS (pain or burning when urinating, or frequent urination) *BOWEL PROBLEMS (unusual diarrhea, constipation, pain near the anus) TENDERNESS IN MOUTH AND THROAT WITH OR WITHOUT PRESENCE OF ULCERS (sore throat, sores in mouth, or a toothache) UNUSUAL RASH, SWELLING OR PAIN  UNUSUAL VAGINAL DISCHARGE OR ITCHING   Items with * indicate a potential emergency and should be followed up as soon as possible or go to the Emergency Department if any problems should occur.  Please show the CHEMOTHERAPY ALERT CARD or IMMUNOTHERAPY ALERT CARD at check-in to the  Emergency Department and triage nurse.  Should you have questions after your visit or need to cancel or reschedule your appointment, please contact [facility]  Dept: [phone]  and follow the prompts.  Office hours are 8:00 a.m. to 4:30 p.m. Monday - Friday. Please note that voicemails left after 4:00 p.m. may not be returned until the following business day.  We are closed weekends and major holidays. You have access to a nurse at all times for urgent questions. Please call the main number to the clinic Dept: [phone] and follow the prompts.   For any non-urgent questions, you may also contact your provider using MyChart. We now offer e-Visits for anyone 18 and older to request care online for non-urgent symptoms. For details visit [email or web address].   Also download the MyChart app! Go to the app store, search "MyChart", open the app, select [facility], and log in with your MyChart username and password.  Due to Covid, a mask is required upon entering the hospital/clinic. If you do not have a mask, one will be given to you upon arrival. For doctor visits, patients may have 1 support person aged 18 or older with them. For treatment visits, patients cannot have anyone with them due to current Covid guidelines and our immunocompromised population.   

## 2021-01-12 ENCOUNTER — Telehealth: Payer: Self-pay

## 2021-01-12 NOTE — Telephone Encounter (Signed)
Contacted pt to let her know all orders /arrangements have been made for inpt stay next week. Admitting to call pt on Monday (01/17/21) . Pt will need Covid test appointment on 01/14/21. Informed pt they are closed for the day but will call in am and make appointment. This RN to call and update pt with time. Pt verbalized understanding.

## 2021-01-13 ENCOUNTER — Other Ambulatory Visit: Payer: Self-pay

## 2021-01-13 ENCOUNTER — Telehealth: Payer: Self-pay

## 2021-01-13 DIAGNOSIS — C8510 Unspecified B-cell lymphoma, unspecified site: Secondary | ICD-10-CM

## 2021-01-13 DIAGNOSIS — Z95828 Presence of other vascular implants and grafts: Secondary | ICD-10-CM

## 2021-01-13 NOTE — Progress Notes (Signed)
HEMATOLOGY-ONCOLOGY PROGRESS NOTE   Date of Encounter: 01/14/2021  CHIEF COMPLAINT /PURPOSE OF CONSULTATION: Large B-Cell Lymphoma   HISTORY OF PRESENTING ILLNESS See previous note.    INTERVAL HISTORY  Lindsey Price is a wonderful 75 y.o. 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 The patient's last visit with [place] was on 12/16/2020. The pt reports that she is doing well overall. She is here prior to C4D1.  The pt reports that this has not been a good week for her, noting fatigue and difficulty eating due to stomach cramping. The pt notes she eats half her baseline. She notes that she does not feel like eating. The pt notes that her bowel movements improve the cramping. The stools are very liquid, but no blood. She has been using supplements such as ensure and the [employer] brand. The pt notes she is able to eat more in the morning. She has lost five pounds and drinks around two of the Ensure drinks. The pt notes no issues with her port and is abl;e to walk daily.  Lab results today 01/14/2021 of CBC w/diff and CMP is as follows: all values are WNL except for Calcium of 8.3, Total Protein of 5.4, Albumin of 2.6, WBC of 11.9K, RBC of 3.40, Hgb of 9.6, HCT of 29.8, RDW of 19.5, Monocytes Abs of 1.3K.  On review of systems, pt reports stomach cramping, intermittent diarrhea, decreased appetite, fatigue and denies nausea, vomiting, fevers, chills, night sweats, abdominal pain, blood/mucus in stools, mouth sores, leg swelling, and any other symptoms.  Oncology History  Cancer of right breast, stage 0  09/13/2009 Initial Biopsy   Right breast core needle biopsy: High-grade DCIS ER 100%, PR 93%   10/11/2009 Surgery   Right breast lumpectomy: High-grade DCIS with necrosis and microcalcifications 1 SLN negative, ER 100%, PR 93%   03/04/2010 - 03/2015 Anti-estrogen oral therapy   Aromasin 25 mg daily with Effexor 75 mg daily for hot flashes    09/27/2020 Relapse/Recurrence   Mammogram showed a 0.8cm upper outer left breast mass. Biopsy showed invasive and in situ ductal carcinoma, HER-2 equivocal by IHC (2+), negative by FISH (ratio 1.59), ER+ >95%, PR+ 85%, Ki67 10%.    Malignant neoplasm of left breast in female, estrogen receptor positive (HCC)  10/12/2020 Initial Diagnosis   Malignant neoplasm of left breast in female, estrogen receptor positive (HCC)   10/12/2020 Cancer Staging   Staging form: Breast, AJCC 8th Edition - Clinical stage from 10/12/2020: Stage IA (cT1b, cN0, cM0, G2, ER+, PR+, HER2-) - Signed by Serena Croissant, MD on 10/15/2020 Stage prefix: Initial diagnosis   11/05/2020 Surgery   Left lumpectomy: Grade 1 IDC, 1.1 cm, intermediate grade DCIS, margins are negative, lymph node -0/1, ER greater than 95%, PR 85%, HER-2 negative, Ki-67 10%   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HCC)  11/11/2020 Initial Diagnosis   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HCC)   11/15/2020 -  Chemotherapy    Patient is on Treatment Plan: IP NON-HODGKINS LYMPHOMA EPOCH Q21D   Patient is on Antibody Plan: NON-HODGKINS LYMPHOMA RITUXIMAB Q21D    11/19/2020 -  Chemotherapy    Patient is on Treatment Plan: IP NON-HODGKINS LYMPHOMA EPOCH Q21D   Patient is on Antibody Plan: NON-HODGKINS LYMPHOMA RITUXIMAB Q21D      REVIEW OF SYSTEMS:   10 Point review of Systems was done is negative except as noted above.  PHYSICAL EXAMINATION: ECOG PERFORMANCE STATUS: 1 - Symptomatic but completely ambulatory  Vitals:  01/14/21 0941  BP: 117/61  Pulse: 95  Resp: 18  Temp: 98.6 F (37 C)  SpO2: 94%   Filed Weights   01/14/21 0941  Weight: 125 lb 12.8 oz (57.1 kg)     GENERAL:alert, in no acute distress and comfortable SKIN: no acute rashes, no significant lesions EYES: conjunctiva are pink and non-injected, sclera anicteric OROPHARYNX: MMM, no exudates, no oropharyngeal erythema or ulceration. Mild redness in throat. NECK: supple, no JVD LYMPH:  no palpable  lymphadenopathy in the cervical, axillary or inguinal regions LUNGS: clear to auscultation b/l with normal respiratory effort HEART: regular rate & rhythm ABDOMEN:  normoactive bowel sounds , non tender, not distended. Extremity: no pedal edema PSYCH: alert & oriented x 3 with fluent speech NEURO: no focal motor/sensory deficits   LABORATORY DATA:  I have reviewed the data as listed CMP Latest Ref Rng & Units 01/14/2021 12/31/2020 12/30/2020  Glucose 70 - 99 mg/dL 93 83 86  BUN 8 - 23 mg/dL $Remove'9 21 17  'vWlnYjb$ Creatinine 0.44 - 1.00 mg/dL 0.70 0.61 0.53  Sodium 135 - 145 mmol/L 138 142 140  Potassium 3.5 - 5.1 mmol/L 3.6 3.4(L) 3.5  Chloride 98 - 111 mmol/L 104 111 110  CO2 22 - 32 mmol/L $RemoveB'26 26 25  'GzCuTxWp$ Calcium 8.9 - 10.3 mg/dL 8.3(L) 8.3(L) 8.4(L)  Total Protein 6.5 - 8.1 g/dL 5.4(L) 4.8(L) 5.1(L)  Total Bilirubin 0.3 - 1.2 mg/dL 0.6 0.6 0.4  Alkaline Phos 38 - 126 U/L 106 70 71  AST 15 - 41 U/L 17 14(L) 13(L)  ALT 0 - 44 U/L $Remo'16 15 15    'BhcdP$ Lab Results  Component Value Date   WBC 11.9 (H) 01/14/2021   HGB 9.6 (L) 01/14/2021   HCT 29.8 (L) 01/14/2021   MCV 87.6 01/14/2021   PLT 290 01/14/2021   NEUTROABS 7.5 01/14/2021    NM PET Image Initial (PI) Skull Base To Thigh  Result Date: 12/18/2020 CLINICAL DATA:  Initial treatment strategy for new diagnosis of large B-cell lymphoma involving the distal small bowel and cecum. Chemotherapy including on 12/06/2020. EXAM: NUCLEAR MEDICINE PET SKULL BASE TO THIGH TECHNIQUE: 6.4 mCi F-18 FDG was injected intravenously. Full-ring PET imaging was performed from the skull base to thigh after the radiotracer. CT data was obtained and used for attenuation correction and anatomic localization. Fasting blood glucose: 103 mg/dl COMPARISON:  10/21/2020 abdominopelvic CT.  CTA chest of 02/12/2006. FINDINGS: Motion degraded CT images. Mediastinal blood pool activity: SUV max 1.4 Liver activity: SUV max 2.3 NECK: No areas of abnormal hypermetabolism. Incidental CT  findings: No cervical adenopathy. Presumed sebaceous cyst about the posterior right neck at 8 mm on 12/04. CHEST: No pulmonary parenchymal or thoracic nodal hypermetabolism. Incidental CT findings:Mild aortic and lad coronary artery calcification. Right Port-A-Cath tip at mid right atrium. ABDOMEN/PELVIS: The right lower quadrant/ileocecal mass is less distinct today, given motion degradation in this area. Corresponds to hypermetabolism at a S.U.V. max of 6.7 including on 151/4. No abdominopelvic nodal hypermetabolism. The spleen is normal in size, mildly hypermetabolic relative to the liver. Incidental CT findings: Aortic atherosclerosis. Hysterectomy. Pelvic floor laxity. Large colonic stool burden. Mild hepatic steatosis. SKELETON: Diffuse moderate marrow hypermetabolism, with an example area in the L4 vertebral body measuring a S.U.V. max of 9.0. Incidental CT findings: none IMPRESSION: 1. Hypermetabolism corresponding to the right lower quadrant, ileocolic mass consistent with the clinical history of bowel lymphoma. 2. Diffuse marrow hypermetabolism which could represent marrow stimulation by chemotherapy or marrow involvement by lymphoma.  3. Mild hypermetabolism of the spleen relative to the liver, nonspecific. No splenomegaly. 4. Incidental findings, including: Coronary artery atherosclerosis. Aortic Atherosclerosis (ICD10-I70.0). Hepatic steatosis. Possible constipation. 5. Motion degraded CT images. The right lower lobe pulmonary nodule described on prior CTs is not readily apparent but likely below PET resolution. Electronically Signed   By: Abigail Miyamoto M.D.   On: 12/18/2020 18:07    ASSESSMENT AND PLAN:  1) large B-cell lymphoma -CT abdomen/pelvis with contrast 10/21/2020 - "Acute colitis/enteritis of the cecum/ascending colon and the associated terminal loops of ileum. As was recently described on CT imaging, the anatomy of distal ileum, ileocecal valve, cecum is atypical and correlation with  patient's prior surgical record would be useful. The acute inflammatory changes contribute to at least a partial small bowel obstruction. Primary concern of this inflammatory cystic mass/bowel of the right lower quadrant is malignancy given that the posterior margin is inseparable from the psoas muscle, adnexa, and the right ureter. Correlation with CEA may be useful. Inflammatory/infectious changes are also a consideration." -Colonoscopy performed 11/04/2020- "An infiltrative and ulcerated partially obstructing large mass was found at 85 cm proximal to the anus. 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 -Plan is for adjuvant radiation therapy followed by adjuvant antiestrogen therapy  3)  iron deficiency anemia -Labs from 11/15/2020-ferritin 34, iron 24, percent saturation 8%, TIBC 308 -Labs from 11/16/2020-vitamin B12 level 241, folate 7.4  4) history of right breast cancer diagnosed in 2011 status post lumpectomy and 5 years of antiestrogen therapy  5) depression/anxiety  6) hyperlipidemia  7) hypertension  8) hypothyroidism  9) allergies  PLAN:  1)large B-cell lymphoma -CT abdomen/pelvis with contrast 10/21/2020- "Acute colitis/enteritis of the cecum/ascending colon and the associated terminal loops of ileum. As was recently described on CT imaging, the anatomy of distal ileum, ileocecal valve, cecum is atypical and correlation with patient's prior surgical record would be useful. The acute inflammatory changes contribute to at least a partial small bowel obstruction. Primary concern of this inflammatory cystic mass/bowel of the right lower quadrant is malignancy given that the posterior margin is inseparable from the psoas muscle, adnexa, and the right ureter. Correlation with CEA may be useful. Inflammatory/infectious changes are also a consideration." -Colonoscopy performed  11/04/2020-"An infiltrative and ulcerated partially obstructing large mass was found at 85 cm proximal to the anus. 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 -Plan is for adjuvant radiation therapy followed by adjuvant antiestrogen therapy after completion of treatment for diffuse large B cell lymphoma  3) iron deficiency anemia -Labs from 11/15/2020-ferritin 34, iron 24, percent saturation 8%, TIBC 308 -Labs from 11/16/2020-vitamin B12 level 241, folate 7.4  4)history of right breast cancer diagnosed in 2011 status post lumpectomy and 5 years of antiestrogen therapy  5)depression/anxiety  6)hyperlipidemia  7)hypertension  8)hypothyroidism  9)allergies  10) Iron deficiency  11) B12 deficiency  12) GERD   PLAN:  -Discussed pt labwork today, 01/13/2021; chemistries normal, blood counts bounced back well. -Advised pt we will connect her with nutritionist to better optimize her diet and food intake. -Recommended low residue diet and soft food intake. Avoid high amounts of roughage and vegetables until stomach settles down. -Advised pt we will repeat scams after completion of this next fourth cycle. -Continue to drink at least 2-3 Ensures and supplemental drinks daily in addition  to meals. -Advised pt to take the Remeron consistently every night to help with increasing appetite. -Recommended smaller, more frequent meals and adding nuts or calorically dense foods to diet to increase caloric intake. -Recommended pt walk 20-30 minutes daily, eat healthy, sleep well, and drink 48-64 oz water daily. -Continue Vitamin B-Complex. -Will see back in 2 weeks with labs.   FOLLOW UP: Inpatient admission for [facility] chemotherapy C4 for 5 days starting 5/16 Outpatient Rituxan and Udenyca on 01/24/2021 Port flush labs and MD visit and appointment for transfusion of 1 unit of  PRBC on 5/26 or 5/27    All of the patients questions were answered with apparent satisfaction. The patient knows to call the clinic with any problems, questions or concerns.    The total time spent in the appointment was 30 minutes and more than 50% was on counseling and direct patient cares, ordering and mx of chemotherapy   Sullivan Lone MD MS AAHIVMS [facility] Hematology/Oncology Physician [facility]  (Office):       [phone] (Work cell):  [phone] (Fax):           [phone]   I, Reinaldo Raddle, am acting as scribe for Dr. Sullivan Lone, MD.  .Brunetta Genera MD

## 2021-01-13 NOTE — Telephone Encounter (Signed)
Pt contacted with time for Covid appointment on 01/14/21. Pt acknowledged.

## 2021-01-14 ENCOUNTER — Other Ambulatory Visit: Payer: Self-pay

## 2021-01-14 ENCOUNTER — Inpatient Hospital Stay: Payer: PPO

## 2021-01-14 ENCOUNTER — Inpatient Hospital Stay: Payer: PPO | Admitting: Hematology

## 2021-01-14 ENCOUNTER — Other Ambulatory Visit (HOSPITAL_COMMUNITY)
Admission: RE | Admit: 2021-01-14 | Discharge: 2021-01-14 | Disposition: A | Discharge status: Home / Self Care | Payer: PPO | Source: Ambulatory Visit | Attending: Hematology | Admitting: Hematology

## 2021-01-14 VITALS — BP 117/61 | HR 95 | Temp 98.6°F | Resp 18 | Ht 66.0 in | Wt 125.8 lb

## 2021-01-14 DIAGNOSIS — Z881 Allergy status to other antibiotic agents status: Secondary | ICD-10-CM | POA: Diagnosis not present

## 2021-01-14 DIAGNOSIS — Z170 Estrogen receptor positive status [ER+]: Secondary | ICD-10-CM | POA: Diagnosis not present

## 2021-01-14 DIAGNOSIS — I10 Essential (primary) hypertension: Secondary | ICD-10-CM | POA: Diagnosis present

## 2021-01-14 DIAGNOSIS — D509 Iron deficiency anemia, unspecified: Secondary | ICD-10-CM | POA: Diagnosis present

## 2021-01-14 DIAGNOSIS — Z5111 Encounter for antineoplastic chemotherapy: Secondary | ICD-10-CM | POA: Diagnosis present

## 2021-01-14 DIAGNOSIS — D0512 Intraductal carcinoma in situ of left breast: Secondary | ICD-10-CM | POA: Diagnosis present

## 2021-01-14 DIAGNOSIS — Z01812 Encounter for preprocedural laboratory examination: Secondary | ICD-10-CM | POA: Insufficient documentation

## 2021-01-14 DIAGNOSIS — Z87891 Personal history of nicotine dependence: Secondary | ICD-10-CM | POA: Diagnosis not present

## 2021-01-14 DIAGNOSIS — C8510 Unspecified B-cell lymphoma, unspecified site: Secondary | ICD-10-CM

## 2021-01-14 DIAGNOSIS — Z79811 Long term (current) use of aromatase inhibitors: Secondary | ICD-10-CM | POA: Diagnosis not present

## 2021-01-14 DIAGNOSIS — J45909 Unspecified asthma, uncomplicated: Secondary | ICD-10-CM | POA: Diagnosis present

## 2021-01-14 DIAGNOSIS — C8519 Unspecified B-cell lymphoma, extranodal and solid organ sites: Secondary | ICD-10-CM | POA: Diagnosis present

## 2021-01-14 DIAGNOSIS — Z5112 Encounter for antineoplastic immunotherapy: Secondary | ICD-10-CM | POA: Diagnosis present

## 2021-01-14 DIAGNOSIS — K760 Fatty (change of) liver, not elsewhere classified: Secondary | ICD-10-CM | POA: Diagnosis present

## 2021-01-14 DIAGNOSIS — I700 Atherosclerosis of aorta: Secondary | ICD-10-CM | POA: Diagnosis present

## 2021-01-14 DIAGNOSIS — Z20822 Contact with and (suspected) exposure to covid-19: Secondary | ICD-10-CM | POA: Diagnosis present

## 2021-01-14 DIAGNOSIS — Z853 Personal history of malignant neoplasm of breast: Secondary | ICD-10-CM | POA: Diagnosis not present

## 2021-01-14 DIAGNOSIS — E785 Hyperlipidemia, unspecified: Secondary | ICD-10-CM | POA: Diagnosis present

## 2021-01-14 DIAGNOSIS — K219 Gastro-esophageal reflux disease without esophagitis: Secondary | ICD-10-CM | POA: Diagnosis present

## 2021-01-14 DIAGNOSIS — E039 Hypothyroidism, unspecified: Secondary | ICD-10-CM | POA: Diagnosis present

## 2021-01-14 DIAGNOSIS — Z95828 Presence of other vascular implants and grafts: Secondary | ICD-10-CM

## 2021-01-14 DIAGNOSIS — C8339 Diffuse large B-cell lymphoma, extranodal and solid organ sites: Secondary | ICD-10-CM | POA: Diagnosis present

## 2021-01-14 DIAGNOSIS — Z7189 Other specified counseling: Secondary | ICD-10-CM | POA: Diagnosis not present

## 2021-01-14 DIAGNOSIS — F419 Anxiety disorder, unspecified: Secondary | ICD-10-CM | POA: Diagnosis present

## 2021-01-14 DIAGNOSIS — I2510 Atherosclerotic heart disease of native coronary artery without angina pectoris: Secondary | ICD-10-CM | POA: Diagnosis present

## 2021-01-14 DIAGNOSIS — F32A Depression, unspecified: Secondary | ICD-10-CM | POA: Diagnosis present

## 2021-01-14 DIAGNOSIS — Z888 Allergy status to other drugs, medicaments and biological substances status: Secondary | ICD-10-CM | POA: Diagnosis not present

## 2021-01-14 LAB — CBC WITH DIFFERENTIAL (CANCER CENTER ONLY)
Abs Immature Granulocytes: 2.21 10*3/uL — ABNORMAL HIGH (ref 0.00–0.07)
Basophils Absolute: 0.1 10*3/uL (ref 0.0–0.1)
Basophils Relative: 1 %
Eosinophils Absolute: 0 10*3/uL (ref 0.0–0.5)
Eosinophils Relative: 0 %
HCT: 29.8 % — ABNORMAL LOW (ref 36.0–46.0)
Hemoglobin: 9.6 g/dL — ABNORMAL LOW (ref 12.0–15.0)
Immature Granulocytes: 19 %
Lymphocytes Relative: 6 %
Lymphs Abs: 0.7 10*3/uL (ref 0.7–4.0)
MCH: 28.2 pg (ref 26.0–34.0)
MCHC: 32.2 g/dL (ref 30.0–36.0)
MCV: 87.6 fL (ref 80.0–100.0)
Monocytes Absolute: 1.3 10*3/uL — ABNORMAL HIGH (ref 0.1–1.0)
Monocytes Relative: 11 %
Neutro Abs: 7.5 10*3/uL (ref 1.7–7.7)
Neutrophils Relative %: 63 %
Platelet Count: 290 10*3/uL (ref 150–400)
RBC: 3.4 MIL/uL — ABNORMAL LOW (ref 3.87–5.11)
RDW: 19.5 % — ABNORMAL HIGH (ref 11.5–15.5)
WBC Count: 11.9 10*3/uL — ABNORMAL HIGH (ref 4.0–10.5)
nRBC: 0 % (ref 0.0–0.2)

## 2021-01-14 LAB — CMP (CANCER CENTER ONLY)
ALT: 16 U/L (ref 0–44)
AST: 17 U/L (ref 15–41)
Albumin: 2.6 g/dL — ABNORMAL LOW (ref 3.5–5.0)
Alkaline Phosphatase: 106 U/L (ref 38–126)
Anion gap: 8 (ref 5–15)
BUN: 9 mg/dL (ref 8–23)
CO2: 26 mmol/L (ref 22–32)
Calcium: 8.3 mg/dL — ABNORMAL LOW (ref 8.9–10.3)
Chloride: 104 mmol/L (ref 98–111)
Creatinine: 0.7 mg/dL (ref 0.44–1.00)
GFR, Estimated: >60 mL/min (ref >60)
Glucose, Bld: 93 mg/dL (ref 70–99)
Potassium: 3.6 mmol/L (ref 3.5–5.1)
Sodium: 138 mmol/L (ref 135–145)
Total Bilirubin: 0.6 mg/dL (ref 0.3–1.2)
Total Protein: 5.4 g/dL — ABNORMAL LOW (ref 6.5–8.1)

## 2021-01-14 MED ORDER — HEPARIN SOD (PORK) LOCK FLUSH 100 UNIT/ML IV SOLN
500.0000 [IU] | Freq: Once | INTRAVENOUS | Status: AC | PRN
  Administered 2021-01-14: 500 [IU]
  Filled 2021-01-14: qty 5

## 2021-01-14 MED ORDER — SODIUM CHLORIDE 0.9% FLUSH
10.0000 mL | INTRAVENOUS | Status: DC | PRN
  Administered 2021-01-14: 10 mL
  Filled 2021-01-14: qty 10

## 2021-01-15 LAB — SARS CORONAVIRUS 2 (TAT 6-24 HRS): SARS Coronavirus 2: NEGATIVE

## 2021-01-17 ENCOUNTER — Inpatient Hospital Stay (HOSPITAL_COMMUNITY)
Admission: RE | Admit: 2021-01-17 | Discharge: 2021-01-21 | DRG: 847 | Disposition: A | Discharge status: Home / Self Care | Payer: PPO | Source: Ambulatory Visit | Attending: Hematology | Admitting: Hematology

## 2021-01-17 ENCOUNTER — Encounter (HOSPITAL_COMMUNITY): Payer: Self-pay | Admitting: Hematology

## 2021-01-17 ENCOUNTER — Other Ambulatory Visit: Payer: Self-pay | Admitting: Gastroenterology

## 2021-01-17 ENCOUNTER — Other Ambulatory Visit: Payer: Self-pay

## 2021-01-17 DIAGNOSIS — K760 Fatty (change of) liver, not elsewhere classified: Secondary | ICD-10-CM | POA: Diagnosis present

## 2021-01-17 DIAGNOSIS — I10 Essential (primary) hypertension: Secondary | ICD-10-CM | POA: Diagnosis present

## 2021-01-17 DIAGNOSIS — Z888 Allergy status to other drugs, medicaments and biological substances status: Secondary | ICD-10-CM | POA: Diagnosis not present

## 2021-01-17 DIAGNOSIS — Z170 Estrogen receptor positive status [ER+]: Secondary | ICD-10-CM

## 2021-01-17 DIAGNOSIS — Z79811 Long term (current) use of aromatase inhibitors: Secondary | ICD-10-CM

## 2021-01-17 DIAGNOSIS — C8510 Unspecified B-cell lymphoma, unspecified site: Secondary | ICD-10-CM | POA: Diagnosis not present

## 2021-01-17 DIAGNOSIS — Z853 Personal history of malignant neoplasm of breast: Secondary | ICD-10-CM | POA: Diagnosis not present

## 2021-01-17 DIAGNOSIS — Z5111 Encounter for antineoplastic chemotherapy: Secondary | ICD-10-CM | POA: Diagnosis present

## 2021-01-17 DIAGNOSIS — F32A Depression, unspecified: Secondary | ICD-10-CM | POA: Diagnosis present

## 2021-01-17 DIAGNOSIS — Z881 Allergy status to other antibiotic agents status: Secondary | ICD-10-CM | POA: Diagnosis not present

## 2021-01-17 DIAGNOSIS — K219 Gastro-esophageal reflux disease without esophagitis: Secondary | ICD-10-CM | POA: Diagnosis present

## 2021-01-17 DIAGNOSIS — Z20822 Contact with and (suspected) exposure to covid-19: Secondary | ICD-10-CM | POA: Diagnosis present

## 2021-01-17 DIAGNOSIS — Z87891 Personal history of nicotine dependence: Secondary | ICD-10-CM | POA: Diagnosis not present

## 2021-01-17 DIAGNOSIS — I700 Atherosclerosis of aorta: Secondary | ICD-10-CM | POA: Diagnosis present

## 2021-01-17 DIAGNOSIS — D649 Anemia, unspecified: Secondary | ICD-10-CM

## 2021-01-17 DIAGNOSIS — C8339 Diffuse large B-cell lymphoma, extranodal and solid organ sites: Secondary | ICD-10-CM | POA: Diagnosis present

## 2021-01-17 DIAGNOSIS — D0512 Intraductal carcinoma in situ of left breast: Secondary | ICD-10-CM | POA: Diagnosis present

## 2021-01-17 DIAGNOSIS — I2510 Atherosclerotic heart disease of native coronary artery without angina pectoris: Secondary | ICD-10-CM | POA: Diagnosis present

## 2021-01-17 DIAGNOSIS — J45909 Unspecified asthma, uncomplicated: Secondary | ICD-10-CM | POA: Diagnosis present

## 2021-01-17 DIAGNOSIS — F419 Anxiety disorder, unspecified: Secondary | ICD-10-CM | POA: Diagnosis present

## 2021-01-17 DIAGNOSIS — E785 Hyperlipidemia, unspecified: Secondary | ICD-10-CM | POA: Diagnosis present

## 2021-01-17 DIAGNOSIS — E039 Hypothyroidism, unspecified: Secondary | ICD-10-CM | POA: Diagnosis present

## 2021-01-17 DIAGNOSIS — D509 Iron deficiency anemia, unspecified: Secondary | ICD-10-CM | POA: Diagnosis present

## 2021-01-17 DIAGNOSIS — D500 Iron deficiency anemia secondary to blood loss (chronic): Secondary | ICD-10-CM

## 2021-01-17 LAB — CBC WITH DIFFERENTIAL/PLATELET
Abs Immature Granulocytes: 0.52 10*3/uL — ABNORMAL HIGH (ref 0.00–0.07)
Basophils Absolute: 0.1 10*3/uL (ref 0.0–0.1)
Basophils Relative: 1 %
Eosinophils Absolute: 0.1 10*3/uL (ref 0.0–0.5)
Eosinophils Relative: 1 %
HCT: 28.1 % — ABNORMAL LOW (ref 36.0–46.0)
Hemoglobin: 9 g/dL — ABNORMAL LOW (ref 12.0–15.0)
Immature Granulocytes: 7 %
Lymphocytes Relative: 11 %
Lymphs Abs: 0.9 10*3/uL (ref 0.7–4.0)
MCH: 28.8 pg (ref 26.0–34.0)
MCHC: 32 g/dL (ref 30.0–36.0)
MCV: 90.1 fL (ref 80.0–100.0)
Monocytes Absolute: 1 10*3/uL (ref 0.1–1.0)
Monocytes Relative: 14 %
Neutro Abs: 5 10*3/uL (ref 1.7–7.7)
Neutrophils Relative %: 66 %
Platelets: 343 10*3/uL (ref 150–400)
RBC: 3.12 MIL/uL — ABNORMAL LOW (ref 3.87–5.11)
RDW: 19.9 % — ABNORMAL HIGH (ref 11.5–15.5)
WBC: 7.5 10*3/uL (ref 4.0–10.5)
nRBC: 0 % (ref 0.0–0.2)

## 2021-01-17 LAB — COMPREHENSIVE METABOLIC PANEL
ALT: 18 U/L (ref 0–44)
AST: 15 U/L (ref 15–41)
Albumin: 2.8 g/dL — ABNORMAL LOW (ref 3.5–5.0)
Alkaline Phosphatase: 81 U/L (ref 38–126)
Anion gap: 5 (ref 5–15)
BUN: 15 mg/dL (ref 8–23)
CO2: 25 mmol/L (ref 22–32)
Calcium: 8.4 mg/dL — ABNORMAL LOW (ref 8.9–10.3)
Chloride: 108 mmol/L (ref 98–111)
Creatinine, Ser: 0.53 mg/dL (ref 0.44–1.00)
GFR, Estimated: >60 mL/min (ref >60)
Glucose, Bld: 95 mg/dL (ref 70–99)
Potassium: 3.5 mmol/L (ref 3.5–5.1)
Sodium: 138 mmol/L (ref 135–145)
Total Bilirubin: 0.4 mg/dL (ref 0.3–1.2)
Total Protein: 5.5 g/dL — ABNORMAL LOW (ref 6.5–8.1)

## 2021-01-17 LAB — LACTATE DEHYDROGENASE: LDH: 178 U/L (ref 98–192)

## 2021-01-17 LAB — URIC ACID: Uric Acid, Serum: 4.7 mg/dL (ref 2.5–7.1)

## 2021-01-17 MED ORDER — SODIUM CHLORIDE 0.9% FLUSH
10.0000 mL | Freq: Two times a day (BID) | INTRAVENOUS | Status: DC
  Administered 2021-01-17 – 2021-01-18 (×2): 10 mL
  Administered 2021-01-18 – 2021-01-19 (×2): 20 mL
  Administered 2021-01-20 – 2021-01-21 (×2): 10 mL

## 2021-01-17 MED ORDER — MIRTAZAPINE 15 MG PO TABS
7.5000 mg | ORAL_TABLET | Freq: Every day | ORAL | Status: DC
  Administered 2021-01-17 – 2021-01-20 (×4): 7.5 mg via ORAL
  Filled 2021-01-17 (×4): qty 1

## 2021-01-17 MED ORDER — MAGIC MOUTHWASH W/LIDOCAINE
5.0000 mL | Freq: Four times a day (QID) | ORAL | Status: DC | PRN
  Filled 2021-01-17: qty 5

## 2021-01-17 MED ORDER — LEVOTHYROXINE SODIUM 100 MCG PO TABS
100.0000 ug | ORAL_TABLET | ORAL | Status: DC
  Administered 2021-01-18 – 2021-01-20 (×2): 100 ug via ORAL
  Filled 2021-01-17 (×3): qty 1

## 2021-01-17 MED ORDER — CALCIUM CARBONATE 1250 (500 CA) MG PO TABS
1250.0000 mg | ORAL_TABLET | Freq: Every day | ORAL | Status: DC
  Administered 2021-01-18 – 2021-01-21 (×4): 1250 mg via ORAL
  Filled 2021-01-17 (×4): qty 1

## 2021-01-17 MED ORDER — SODIUM CHLORIDE 0.9 % IV SOLN: INTRAVENOUS | Status: DC

## 2021-01-17 MED ORDER — EZETIMIBE 10 MG PO TABS
10.0000 mg | ORAL_TABLET | Freq: Every day | ORAL | Status: DC
  Administered 2021-01-17 – 2021-01-21 (×5): 10 mg via ORAL
  Filled 2021-01-17 (×5): qty 1

## 2021-01-17 MED ORDER — LIDOCAINE-PRILOCAINE 2.5-2.5 % EX CREA: 1.0000 "application " | TOPICAL_CREAM | CUTANEOUS | Status: DC | PRN

## 2021-01-17 MED ORDER — COLD PACK MISC ONCOLOGY
1.0000 | Freq: Once | Status: DC | PRN
  Filled 2021-01-17: qty 1

## 2021-01-17 MED ORDER — FAMOTIDINE 20 MG PO TABS: 20.0000 mg | ORAL_TABLET | Freq: Two times a day (BID) | ORAL | Status: DC | PRN

## 2021-01-17 MED ORDER — PANTOPRAZOLE SODIUM 40 MG PO TBEC
40.0000 mg | DELAYED_RELEASE_TABLET | Freq: Every day | ORAL | Status: DC
  Administered 2021-01-18 – 2021-01-21 (×4): 40 mg via ORAL
  Filled 2021-01-17 (×5): qty 1

## 2021-01-17 MED ORDER — CHLORHEXIDINE GLUCONATE CLOTH 2 % EX PADS
6.0000 | MEDICATED_PAD | Freq: Every day | CUTANEOUS | Status: DC
  Administered 2021-01-17 – 2021-01-21 (×5): 6 via TOPICAL

## 2021-01-17 MED ORDER — POLYETHYLENE GLYCOL 3350 17 G PO PACK
17.0000 g | PACK | Freq: Every day | ORAL | Status: DC
  Administered 2021-01-18 – 2021-01-21 (×4): 17 g via ORAL
  Filled 2021-01-17 (×5): qty 1

## 2021-01-17 MED ORDER — ENOXAPARIN SODIUM 40 MG/0.4ML IJ SOSY
40.0000 mg | PREFILLED_SYRINGE | Freq: Every day | INTRAMUSCULAR | Status: DC
Start: 2021-01-17 — End: 2021-01-21
  Administered 2021-01-17 – 2021-01-21 (×5): 40 mg via SUBCUTANEOUS
  Filled 2021-01-17 (×5): qty 0.4

## 2021-01-17 MED ORDER — PREDNISONE 20 MG PO TABS
60.0000 mg | ORAL_TABLET | Freq: Every day | ORAL | Status: AC
  Administered 2021-01-17 – 2021-01-21 (×5): 60 mg via ORAL
  Filled 2021-01-17 (×5): qty 3

## 2021-01-17 MED ORDER — ENSURE ENLIVE PO LIQD
237.0000 mL | Freq: Two times a day (BID) | ORAL | Status: DC
  Administered 2021-01-17 – 2021-01-21 (×9): 237 mL via ORAL

## 2021-01-17 MED ORDER — SENNOSIDES-DOCUSATE SODIUM 8.6-50 MG PO TABS
1.0000 | ORAL_TABLET | Freq: Two times a day (BID) | ORAL | Status: DC
  Administered 2021-01-17 – 2021-01-21 (×8): 1 via ORAL
  Filled 2021-01-17 (×8): qty 1

## 2021-01-17 MED ORDER — ALBUTEROL SULFATE HFA 108 (90 BASE) MCG/ACT IN AERS
1.0000 | INHALATION_SPRAY | Freq: Four times a day (QID) | RESPIRATORY_TRACT | Status: DC | PRN
Start: 2021-01-17 — End: 2021-01-21
  Filled 2021-01-17: qty 6.7

## 2021-01-17 MED ORDER — MONTELUKAST SODIUM 10 MG PO TABS
10.0000 mg | ORAL_TABLET | Freq: Every day | ORAL | Status: DC
  Administered 2021-01-18 – 2021-01-21 (×4): 10 mg via ORAL
  Filled 2021-01-17 (×4): qty 1

## 2021-01-17 MED ORDER — SODIUM CHLORIDE 0.9% FLUSH: 10.0000 mL | INTRAVENOUS | Status: DC | PRN

## 2021-01-17 MED ORDER — VINCRISTINE SULFATE CHEMO INJECTION 1 MG/ML
Freq: Once | INTRAVENOUS | Status: AC
  Filled 2021-01-17: qty 8

## 2021-01-17 MED ORDER — FLUTICASONE PROPIONATE 50 MCG/ACT NA SUSP
1.0000 | Freq: Every day | NASAL | Status: DC
  Administered 2021-01-17 – 2021-01-21 (×5): 1 via NASAL
  Filled 2021-01-17: qty 16

## 2021-01-17 MED ORDER — SODIUM BICARBONATE/SODIUM CHLORIDE MOUTHWASH
1.0000 "application " | Freq: Four times a day (QID) | OROMUCOSAL | Status: DC
  Administered 2021-01-17 – 2021-01-21 (×16): 1 via OROMUCOSAL
  Filled 2021-01-17: qty 1000

## 2021-01-17 MED ORDER — VITAMIN D (ERGOCALCIFEROL) 1.25 MG (50000 UNIT) PO CAPS
50000.0000 [IU] | ORAL_CAPSULE | ORAL | Status: DC
  Administered 2021-01-21: 50000 [IU] via ORAL
  Filled 2021-01-17: qty 1

## 2021-01-17 MED ORDER — LEVOTHYROXINE SODIUM 88 MCG PO TABS
88.0000 ug | ORAL_TABLET | ORAL | Status: DC
  Administered 2021-01-19 – 2021-01-21 (×2): 88 ug via ORAL
  Filled 2021-01-17 (×2): qty 1

## 2021-01-17 MED ORDER — ADULT MULTIVITAMIN W/MINERALS CH
1.0000 | ORAL_TABLET | Freq: Every day | ORAL | Status: DC
Start: 2021-01-18 — End: 2021-01-21
  Administered 2021-01-18 – 2021-01-21 (×4): 1 via ORAL
  Filled 2021-01-17 (×4): qty 1

## 2021-01-17 MED ORDER — HOT PACK MISC ONCOLOGY
1.0000 | Freq: Once | Status: DC | PRN
  Filled 2021-01-17: qty 1

## 2021-01-17 MED ORDER — LORATADINE 10 MG PO TABS
10.0000 mg | ORAL_TABLET | Freq: Every day | ORAL | Status: DC
  Administered 2021-01-18 – 2021-01-21 (×4): 10 mg via ORAL
  Filled 2021-01-17 (×4): qty 1

## 2021-01-17 MED ORDER — SODIUM CHLORIDE 0.9 % IV SOLN
Freq: Once | INTRAVENOUS | Status: AC
  Administered 2021-01-17: 18 mg via INTRAVENOUS
  Filled 2021-01-17: qty 4

## 2021-01-17 MED ORDER — CYANOCOBALAMIN 1000 MCG/ML IJ SOLN
1000.0000 ug | Freq: Once | INTRAMUSCULAR | Status: AC
  Administered 2021-01-17: 1000 ug via SUBCUTANEOUS
  Filled 2021-01-17: qty 1

## 2021-01-17 MED ORDER — ALUM & MAG HYDROXIDE-SIMETH 200-200-20 MG/5ML PO SUSP
30.0000 mL | ORAL | Status: DC | PRN
  Administered 2021-01-19 – 2021-01-21 (×6): 30 mL via ORAL
  Filled 2021-01-17 (×6): qty 30

## 2021-01-17 MED ORDER — LORAZEPAM 0.5 MG PO TABS
0.5000 mg | ORAL_TABLET | Freq: Four times a day (QID) | ORAL | Status: DC | PRN
Start: 2021-01-17 — End: 2021-01-21
  Administered 2021-01-17 – 2021-01-20 (×4): 0.5 mg via ORAL
  Filled 2021-01-17 (×4): qty 1

## 2021-01-17 MED ORDER — PROCHLORPERAZINE MALEATE 10 MG PO TABS
10.0000 mg | ORAL_TABLET | Freq: Four times a day (QID) | ORAL | Status: DC | PRN
Start: 2021-01-17 — End: 2021-01-21

## 2021-01-17 MED ORDER — VENLAFAXINE HCL ER 37.5 MG PO CP24
37.5000 mg | ORAL_CAPSULE | Freq: Every day | ORAL | Status: DC
  Administered 2021-01-18 – 2021-01-21 (×4): 37.5 mg via ORAL
  Filled 2021-01-17 (×4): qty 1

## 2021-01-17 MED ORDER — ATORVASTATIN CALCIUM 10 MG PO TABS
10.0000 mg | ORAL_TABLET | Freq: Every day | ORAL | Status: DC
  Administered 2021-01-17 – 2021-01-20 (×4): 10 mg via ORAL
  Filled 2021-01-17 (×4): qty 1

## 2021-01-17 MED ORDER — HYDROCODONE-ACETAMINOPHEN 5-325 MG PO TABS: 1.0000 | ORAL_TABLET | Freq: Four times a day (QID) | ORAL | Status: DC | PRN

## 2021-01-17 NOTE — H&P (Addendum)
[place]  Telephone:([phone]) [phone] Fax:(336) [phone]   MEDICAL ONCOLOGY - ADMISSION H&P  Reason for Admission: Cycle #4 EPOCH-R, Large B-Cell Lymphoma of the colon  HPI: Ms. 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  The patient has been being followed by Dr. Lindi Adie in our office for her breast cancer.  The patient had a CT of the abdomen/pelvis with contrast due to abdominal pain performed on 10/21/2020 which showed acute colitis/enteritis of the cecum/ascending colon and the associated terminal loops of ileum, acute inflammatory changes contribute to at least a partial small bowel obstruction, primary concern of this inflammatory cystic mass/bowel of the right lower quadrant is malignancy given that the posterior margin is inseparable from the psoas muscle, adnexa, and right ureter.  A colonoscopy was performed on 11/04/2020 which showed an infiltrative and ulcerated partially obstructing large mass was found at 85 cm proximal to the anus. The mass was circumferential.  This mass was biopsied and was consistent with large B-cell lymphoma.  Due to this new finding, the patient was referred to Dr. Irene Limbo for consideration of systemic chemotherapy for treatment of her B-cell lymphoma.  The patient is seen today prior to cycle #4 of her chemotherapy.  Overall, the patient reports that she is feeling well today.  She is not having any fevers or chills.  She denies chest pain, shortness of breath, cough.  Denies abdominal pain, nausea, vomiting, constipation, diarrhea.  The  patient is seen today for admission prior to cycle #4 of EPOCH-R.      Past Medical History:  Diagnosis Date  . Allergy   . Anemia   . Anxiety   . Arthralgia 09/11/2011  . Asthma    cough variant asthma  . Breast cancer ([place]) 2011   RIGHT BREAST CA  . Breast cancer, left ([place]) 09/2020   left breast IDC  . Cataract   . 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 Depression   . GERD (gastroesophageal reflux disease)   . Hyperlipidemia   . Hypertension   . Hypothyroidism   . Insomnia   . Thyroid disease    hypothyroidism                                           . CATARACT EXTRACTION, BILATERAL    . COLONOSCOPY  2015  . COLONOSCOPY WITH PROPOFOL N/A 04/14/2014   Procedure: COLONOSCOPY WITH PROPOFOL;  Surgeon: Garlan Fair, MD;  Location: [facility];  Service: Endoscopy;  Laterality: N/A;  . IR IMAGING GUIDED PORT INSERTION  12/01/2020  . POLYPECTOMY    . ROTATOR CUFF REPAIR Left 04/2013   Dr. Para March         Allergies  Allergen Reactions  . Advair Diskus [Fluticasone-Salmeterol] Other (See Comments)    Hoarseness.  . Neosporin [Neomycin-Polymyxin-Gramicidin] Itching                               Maternal Aunt  lung  . Cancer Paternal Uncle        colon, stomach  . Colon cancer Neg Hx   . Colon polyps Neg Hx   . Esophageal cancer Neg Hx   . Rectal cancer Neg Hx   . Stomach cancer Neg Hx      Socioeconomic History  . Marital status: Married    Spouse name: Not on file  . Number of children: Not on file  . Years of education: Not on file  . Highest education level: Not on file  Occupational History  . Not on file  Tobacco Use  . Smoking status: Former Smoker    Packs/day: 1.00    Years: 15.00    Pack years: 15.00    Types: Cigarettes    Quit date: 12/09/1978    Years since quitting: 42.0  . Smokeless tobacco: Never Used  Vaping Use  . Vaping Use: Never used  Substance and Sexual Activity  . Alcohol use: Yes     Alcohol/week: 7.0 standard drinks    Types: 7 Glasses of wine per week    Comment: SOCIALLY wine  . Drug use: No  . Sexual activity: Yes    Birth control/protection: Surgical  Other Topics Concern  . Not on file  Social History Narrative  . Not on file   Social Determinants of Health   Financial Resource Strain: Not on file  Food Insecurity: Not on file  Transportation Needs: Not on file  Physical Activity: Not on file  Stress: Not on file  Social Connections: Not on file  Intimate Partner Violence: Not At Risk  . Fear of Current or Ex-Partner: No  . Emotionally Abused: No  . Physically Abused: No  . Sexually Abused: No  :  Review of Systems: A comprehensive 14 point review of systems was negative except as noted in the HPI.  Exam: There were no vitals taken for this visit.  General:  well-nourished in no acute distress.   Eyes:  no scleral icterus.   ENT:  There were no oropharyngeal lesions.    Lymphatics:  Negative cervical, supraclavicular or axillary adenopathy.   Respiratory: lungs were clear bilaterally without wheezing or crackles.   Cardiovascular:  Regular rate and rhythm, S1/S2, without murmur, rub or gallop.  There was no pedal edema.   GI:  abdomen was soft, flat, nontender, nondistended, without organomegaly.   Musculoskeletal:  no spinal tenderness of palpation of vertebral spine.   Skin exam was without echymosis, petichae.   Neuro exam was nonfocal. Patient was alert and oriented.  Attention was good.   Language was appropriate.  Mood was normal without depression.  Speech was not pressured.  Thought content was not tangential.     Lab Results  Component Value Date   WBC 11.1 (H) 12/24/2020   HGB 9.7 (L) 12/24/2020   HCT 31.0 (L) 12/24/2020   PLT 246 12/24/2020   GLUCOSE 90 12/24/2020   CHOL 94 (L) 12/19/2019   TRIG 54 12/19/2019   HDL 32 (L) 12/19/2019   LDLCALC 49 12/19/2019   ALT 18 12/24/2020   AST 19 12/24/2020   NA 139 12/24/2020   K 4.1  12/24/2020   CL 107 12/24/2020   CREATININE 0.65 12/24/2020   BUN 16 12/24/2020   CO2 24 12/24/2020    NM PET Image Initial (PI) Skull Base To Thigh  Result Date: 12/18/2020 CLINICAL DATA:  Initial treatment strategy for new diagnosis of large B-cell lymphoma involving the distal small  bowel and cecum. Chemotherapy including on 12/06/2020. EXAM: NUCLEAR MEDICINE PET SKULL BASE TO THIGH TECHNIQUE: 6.4 mCi F-18 FDG was injected intravenously. Full-ring PET imaging was performed from the skull base to thigh after the radiotracer. CT data was obtained and used for attenuation correction and anatomic localization. Fasting blood glucose: 103 mg/dl COMPARISON:  10/21/2020 abdominopelvic CT.  CTA chest of 02/12/2006. FINDINGS: Motion degraded CT images. Mediastinal blood pool activity: SUV max 1.4 Liver activity: SUV max 2.3 NECK: No areas of abnormal hypermetabolism. Incidental CT findings: No cervical adenopathy. Presumed sebaceous cyst about the posterior right neck at 8 mm on 12/04. CHEST: No pulmonary parenchymal or thoracic nodal hypermetabolism. Incidental CT findings:Mild aortic and lad coronary artery calcification. Right Port-A-Cath tip at mid right atrium. ABDOMEN/PELVIS: The right lower quadrant/ileocecal mass is less distinct today, given motion degradation in this area. Corresponds to hypermetabolism at a S.U.V. max of 6.7 including on 151/4. No abdominopelvic nodal hypermetabolism. The spleen is normal in size, mildly hypermetabolic relative to the liver. Incidental CT findings: Aortic atherosclerosis. Hysterectomy. Pelvic floor laxity. Large colonic stool burden. Mild hepatic steatosis. SKELETON: Diffuse moderate marrow hypermetabolism, with an example area in the L4 vertebral body measuring a S.U.V. max of 9.0. Incidental CT findings: none IMPRESSION: 1. Hypermetabolism corresponding to the right lower quadrant, ileocolic mass consistent with the clinical history of bowel lymphoma. 2. Diffuse  marrow hypermetabolism which could represent marrow stimulation by chemotherapy or marrow involvement by lymphoma. 3. Mild hypermetabolism of the spleen relative to the liver, nonspecific. No splenomegaly. 4. Incidental findings, including: Coronary artery atherosclerosis. Aortic Atherosclerosis (ICD10-I70.0). Hepatic steatosis. Possible constipation. 5. Motion degraded CT images. The right lower lobe pulmonary nodule described on prior CTs is not readily apparent but likely below PET resolution. Electronically Signed   By: Abigail Miyamoto M.D.   On: 12/18/2020 18:07   IR IMAGING GUIDED PORT INSERTION  Result Date: 12/01/2020 CLINICAL DATA:  Left breast carcinoma and diffuse large B-cell lymphoma. The patient requires a porta cath to begin chemotherapy. EXAM: IMPLANTED PORT A CATH PLACEMENT WITH ULTRASOUND AND FLUOROSCOPIC GUIDANCE ANESTHESIA/SEDATION: 2.0 mg IV Versed; 100 mcg IV Fentanyl Total Moderate Sedation Time:  34 minutes The patient's level of consciousness and physiologic status were continuously monitored during the procedure by Radiology nursing. FLUOROSCOPY TIME:  24 seconds.  3.0 mGy. PROCEDURE: The procedure, risks, benefits, and alternatives were explained to the patient. Questions regarding the procedure were encouraged and answered. The patient understands and consents to the procedure. A time-out was performed prior to initiating the procedure. Ultrasound was utilized to confirm patency of the right internal jugular vein. The right neck and chest were prepped with chlorhexidine in a sterile fashion, and a sterile drape was applied covering the operative field. Maximum barrier sterile technique with sterile gowns and gloves were used for the procedure. Local anesthesia was provided with 1% lidocaine. After creating a small venotomy incision, a 21 gauge needle was advanced into the right internal jugular vein under direct, real-time ultrasound guidance. Ultrasound image documentation was  performed. After securing guidewire access, an 8 Fr dilator was placed. A J-wire was kinked to measure appropriate catheter length. A subcutaneous port pocket was then created along the upper chest wall utilizing sharp and blunt dissection. Portable cautery was utilized. The pocket was irrigated with sterile saline. A single lumen power injectable port was chosen for placement. The 8 Fr catheter was tunneled from the port pocket site to the venotomy incision. The port was placed in the pocket. External  catheter was trimmed to appropriate length based on guidewire measurement. At the venotomy, an 8 Fr peel-away sheath was placed over a guidewire. The catheter was then placed through the sheath and the sheath removed. Final catheter positioning was confirmed and documented with a fluoroscopic spot image. The port was accessed with a needle and aspirated and flushed with heparinized saline. The access needle was removed. The venotomy and port pocket incisions were closed with subcutaneous 3-0 Monocryl and subcuticular 4-0 Vicryl. Dermabond was applied to both incisions. COMPLICATIONS: COMPLICATIONS None FINDINGS: After catheter placement, the tip lies at the cavo-atrial junction. The catheter aspirates normally and is ready for immediate use. IMPRESSION: Placement of single lumen port a cath via right internal jugular vein. The catheter tip lies at the cavo-atrial junction. A power injectable port a cath was placed and is ready for immediate use. Electronically Signed   By: Aletta Edouard M.D.   On: 12/01/2020 11:20     NM PET Image Initial (PI) Skull Base To Thigh  Result Date: 12/18/2020 CLINICAL DATA:  Initial treatment strategy for new diagnosis of large B-cell lymphoma involving the distal small bowel and cecum. Chemotherapy including on 12/06/2020. EXAM: NUCLEAR MEDICINE PET SKULL BASE TO THIGH TECHNIQUE: 6.4 mCi F-18 FDG was injected intravenously. Full-ring PET imaging was performed from the skull base  to thigh after the radiotracer. CT data was obtained and used for attenuation correction and anatomic localization. Fasting blood glucose: 103 mg/dl COMPARISON:  10/21/2020 abdominopelvic CT.  CTA chest of 02/12/2006. FINDINGS: Motion degraded CT images. Mediastinal blood pool activity: SUV max 1.4 Liver activity: SUV max 2.3 NECK: No areas of abnormal hypermetabolism. Incidental CT findings: No cervical adenopathy. Presumed sebaceous cyst about the posterior right neck at 8 mm on 12/04. CHEST: No pulmonary parenchymal or thoracic nodal hypermetabolism. Incidental CT findings:Mild aortic and lad coronary artery calcification. Right Port-A-Cath tip at mid right atrium. ABDOMEN/PELVIS: The right lower quadrant/ileocecal mass is less distinct today, given motion degradation in this area. Corresponds to hypermetabolism at a S.U.V. max of 6.7 including on 151/4. No abdominopelvic nodal hypermetabolism. The spleen is normal in size, mildly hypermetabolic relative to the liver. Incidental CT findings: Aortic atherosclerosis. Hysterectomy. Pelvic floor laxity. Large colonic stool burden. Mild hepatic steatosis. SKELETON: Diffuse moderate marrow hypermetabolism, with an example area in the L4 vertebral body measuring a S.U.V. max of 9.0. Incidental CT findings: none IMPRESSION: 1. Hypermetabolism corresponding to the right lower quadrant, ileocolic mass consistent with the clinical history of bowel lymphoma. 2. Diffuse marrow hypermetabolism which could represent marrow stimulation by chemotherapy or marrow involvement by lymphoma. 3. Mild hypermetabolism of the spleen relative to the liver, nonspecific. No splenomegaly. 4. Incidental findings, including: Coronary artery atherosclerosis. Aortic Atherosclerosis (ICD10-I70.0). Hepatic steatosis. Possible constipation. 5. Motion degraded CT images. The right lower lobe pulmonary nodule described on prior CTs is not readily apparent but likely below PET resolution.  Electronically Signed   By: Abigail Miyamoto M.D.   On: 12/18/2020 18:07   IR IMAGING GUIDED PORT INSERTION  Result Date: 12/01/2020 CLINICAL DATA:  Left breast carcinoma and diffuse large B-cell lymphoma. The patient requires a porta cath to begin chemotherapy. EXAM: IMPLANTED PORT A CATH PLACEMENT WITH ULTRASOUND AND FLUOROSCOPIC GUIDANCE ANESTHESIA/SEDATION: 2.0 mg IV Versed; 100 mcg IV Fentanyl Total Moderate Sedation Time:  34 minutes The patient's level of consciousness and physiologic status were continuously monitored during the procedure by Radiology nursing. FLUOROSCOPY TIME:  24 seconds.  3.0 mGy. PROCEDURE: The procedure, risks, benefits, and  alternatives were explained to the patient. Questions regarding the procedure were encouraged and answered. The patient understands and consents to the procedure. A time-out was performed prior to initiating the procedure. Ultrasound was utilized to confirm patency of the right internal jugular vein. The right neck and chest were prepped with chlorhexidine in a sterile fashion, and a sterile drape was applied covering the operative field. Maximum barrier sterile technique with sterile gowns and gloves were used for the procedure. Local anesthesia was provided with 1% lidocaine. After creating a small venotomy incision, a 21 gauge needle was advanced into the right internal jugular vein under direct, real-time ultrasound guidance. Ultrasound image documentation was performed. After securing guidewire access, an 8 Fr dilator was placed. A J-wire was kinked to measure appropriate catheter length. A subcutaneous port pocket was then created along the upper chest wall utilizing sharp and blunt dissection. Portable cautery was utilized. The pocket was irrigated with sterile saline. A single lumen power injectable port was chosen for placement. The 8 Fr catheter was tunneled from the port pocket site to the venotomy incision. The port was placed in the pocket. External  catheter was trimmed to appropriate length based on guidewire measurement. At the venotomy, an 8 Fr peel-away sheath was placed over a guidewire. The catheter was then placed through the sheath and the sheath removed. Final catheter positioning was confirmed and documented with a fluoroscopic spot image. The port was accessed with a needle and aspirated and flushed with heparinized saline. The access needle was removed. The venotomy and port pocket incisions were closed with subcutaneous 3-0 Monocryl and subcuticular 4-0 Vicryl. Dermabond was applied to both incisions. COMPLICATIONS: COMPLICATIONS None FINDINGS: After catheter placement, the tip lies at the cavo-atrial junction. The catheter aspirates normally and is ready for immediate use. IMPRESSION: Placement of single lumen port a cath via right internal jugular vein. The catheter tip lies at the cavo-atrial junction. A power injectable port a cath was placed and is ready for immediate use. Electronically Signed   By: Aletta Edouard M.D.   On: 12/01/2020 11:20    Pathology:  Diagnosis Colon, biopsy - INVOLVEMENT BY LARGE B-CELL LYMPHOMA, SEE COMMENT. Microscopic Comment Fragments of colonic mucosa reveal effacement by a dense lymphoid infiltrate. There is crush artifact, but intact areas reveal sheets of large lymphoid cells with irregular nuclear contours and prominent nucleoli. The cells are positive for CD45, CD20, bcl-6, bcl-2, CD21, and MUM-1. They are negative for CD10, CD30, and EBV in situ hybridization. Ki-67 is elevated. CD3 and CD5 highlight scattered T-cells. The overall findings are consistent with involvement by a large B-cell lymphoma. The differential includes a diffuse large B-cell lymphoma, activated B-cell type or an aggressive B-cell lymphoma (double hit lymphoma). FISH for MYC, BCL-2, and BCL-6 will be attempted and reported in an addendum. Dr. Silverio Decamp was notified on 11/10/2020. Vicente Males MD Pathologist, Electronic  Signature (Case signed 11/10/2020)  Assessment and Plan:   1) Large B-cell lymphoma -CT abdomen/pelvis with contrast 10/21/2020 - "Acute colitis/enteritis of the cecum/ascending colon and the associated terminal loops of ileum. As was recently described on CT imaging, the anatomy of distal ileum, ileocecal valve, cecum is atypical and correlation with patient's prior surgical record would be useful. The acute inflammatory changes contribute to at least a partial small bowel obstruction. Primary concern of this inflammatory cystic mass/bowel of the right lower quadrant is malignancy given that the posterior margin is inseparable from the psoas muscle, adnexa, and the right ureter. Correlation with  CEA may be useful. Inflammatory/infectious changes are also a consideration." -Colonoscopy performed 11/04/2020- "An infiltrative and ulcerated partially obstructing large mass was found at 85 cm proximal to the anus. 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 --Patient initiated treatment with EPOCH on 11/15/2020. --PET scan from 12/17/2020 - "hypermetabolism corresponding to the right lower quadrant, ileocolic mass consistent with the clinical history of bowel lymphoma. Diffuse marrow hypermetabolism which could represent marrow stimulation by chemotherapy or marrow involvement by lymphoma. Mild hypermetabolism of the spleen relative to the liver,nonspecific. No splenomegaly.Incidental findings, including: Coronary artery atherosclerosis.Aortic Atherosclerosis (ICD10-I70.0). Hepatic steatosis. Possible constipation. Motion degraded CT images. The right lower lobe pulmonary nodule described on prior CTs is not readily apparent but likely below PET Resolution.  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  Will review prior to initiation of chemotherapy.  Check CBC with differential and CMET daily. -Continue as needed antiemetics. -MiraLAX and Senokot ordered for constipation. -Lovenox for DVT prophylaxis. -Continue home medications. -The patient is due for her vitamin B12 injection.  We will order vitamin B12 1000 mg subcu x1 dose. -We will arrange for outpatient follow-up at the cancer center.  She will need rituximab and Neulasta on 5/23 and will need a follow-up visit with lab work thereafter.  Mikey Bussing, DNP, AGPCNP-BC, AOCNP   ADDENDUM  .Patient was Personally and independently interviewed, examined and relevant elements of the history of present illness were reviewed in details and an assessment and plan was created. All elements of the patient's history of present illness , assessment and plan were discussed in details with Mikey Bussing, DNP, AGPCNP-BC, AOCNP. The above documentation reflects our combined findings assessment and plan. Patient notes much improvement in fatigue and appetite since her last clinic visit.  Sleeping well.  Notes less abdominal bloating and discomfort.  Tolerating cycle 4-day 1 of chemotherapy well without any acute issues.  Continue chemotherapy as per plan with lab monitoring.  Sullivan Lone MD MS

## 2021-01-18 DIAGNOSIS — C8510 Unspecified B-cell lymphoma, unspecified site: Secondary | ICD-10-CM | POA: Diagnosis not present

## 2021-01-18 DIAGNOSIS — Z5111 Encounter for antineoplastic chemotherapy: Secondary | ICD-10-CM | POA: Diagnosis not present

## 2021-01-18 LAB — CBC WITH DIFFERENTIAL/PLATELET
Abs Immature Granulocytes: 0.38 10*3/uL — ABNORMAL HIGH (ref 0.00–0.07)
Basophils Absolute: 0 10*3/uL (ref 0.0–0.1)
Basophils Relative: 1 %
Eosinophils Absolute: 0 10*3/uL (ref 0.0–0.5)
Eosinophils Relative: 0 %
HCT: 28.9 % — ABNORMAL LOW (ref 36.0–46.0)
Hemoglobin: 9.1 g/dL — ABNORMAL LOW (ref 12.0–15.0)
Immature Granulocytes: 6 %
Lymphocytes Relative: 10 %
Lymphs Abs: 0.6 10*3/uL — ABNORMAL LOW (ref 0.7–4.0)
MCH: 28.3 pg (ref 26.0–34.0)
MCHC: 31.5 g/dL (ref 30.0–36.0)
MCV: 89.8 fL (ref 80.0–100.0)
Monocytes Absolute: 0.5 10*3/uL (ref 0.1–1.0)
Monocytes Relative: 7 %
Neutro Abs: 5.1 10*3/uL (ref 1.7–7.7)
Neutrophils Relative %: 76 %
Platelets: 376 10*3/uL (ref 150–400)
RBC: 3.22 MIL/uL — ABNORMAL LOW (ref 3.87–5.11)
RDW: 19.3 % — ABNORMAL HIGH (ref 11.5–15.5)
WBC: 6.6 10*3/uL (ref 4.0–10.5)
nRBC: 0 % (ref 0.0–0.2)

## 2021-01-18 LAB — COMPREHENSIVE METABOLIC PANEL
ALT: 16 U/L (ref 0–44)
AST: 16 U/L (ref 15–41)
Albumin: 2.8 g/dL — ABNORMAL LOW (ref 3.5–5.0)
Alkaline Phosphatase: 79 U/L (ref 38–126)
Anion gap: 7 (ref 5–15)
BUN: 15 mg/dL (ref 8–23)
CO2: 24 mmol/L (ref 22–32)
Calcium: 8.5 mg/dL — ABNORMAL LOW (ref 8.9–10.3)
Chloride: 109 mmol/L (ref 98–111)
Creatinine, Ser: 0.3 mg/dL — ABNORMAL LOW (ref 0.44–1.00)
GFR, Estimated: >60 mL/min (ref >60)
Glucose, Bld: 130 mg/dL — ABNORMAL HIGH (ref 70–99)
Potassium: 3.9 mmol/L (ref 3.5–5.1)
Sodium: 140 mmol/L (ref 135–145)
Total Bilirubin: 0.6 mg/dL (ref 0.3–1.2)
Total Protein: 5.4 g/dL — ABNORMAL LOW (ref 6.5–8.1)

## 2021-01-18 MED ORDER — SODIUM CHLORIDE 0.9 % IV SOLN
Freq: Once | INTRAVENOUS | Status: AC
  Administered 2021-01-18: 8 mg via INTRAVENOUS
  Filled 2021-01-18: qty 3

## 2021-01-18 MED ORDER — ETOPOSIDE CHEMO INJECTION 500 MG/25ML
Freq: Once | INTRAVENOUS | Status: AC
  Filled 2021-01-18: qty 8

## 2021-01-18 NOTE — Progress Notes (Addendum)
HEMATOLOGY-ONCOLOGY PROGRESS NOTE  SUBJECTIVE: The patient tolerated day 1 of cycle #4 of her chemotherapy well overall.  She has no complaints this morning.  Denies fevers and chills.  She denies mucositis, nausea, vomiting, constipation, diarrhea.  She ambulates in the hallway without any difficulty.  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 Biopsy showed invasive and in situ ductal carcinoma, HER-2 equivocal by IHC (2+), negative by FISH (ratio 1.59), ER+ >95%, PR+ 85%, Ki67 10%.    Malignant neoplasm of left breast in female, estrogen receptor positive ([place])  10/12/2020 Initial Diagnosis   Malignant neoplasm of left breast in female, estrogen receptor positive ([place])   10/12/2020 Cancer Staging   Staging form: Breast, AJCC 8th Edition - Clinical stage from 10/12/2020: Stage IA (cT1b, cN0, cM0, G2, ER+, PR+, HER2-) - Signed by Nicholas Lose, MD on 10/15/2020 Stage prefix: Initial diagnosis   11/05/2020 Surgery   Left lumpectomy: Grade 1 IDC, 1.1 cm, intermediate grade DCIS, margins are negative, lymph node -0/1, ER greater than 95%, PR 85%, HER-2 negative, Ki-67 10%   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[place])  11/11/2020 Initial Diagnosis   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[place])   11/15/2020 -  Chemotherapy    Patient is on Treatment Plan: IP NON-HODGKINS LYMPHOMA EPOCH Q21D   Patient is on Antibody Plan: NON-HODGKINS LYMPHOMA RITUXIMAB Q21D    11/19/2020 -  Chemotherapy    Patient is on Treatment Plan: IP NON-HODGKINS LYMPHOMA EPOCH Q21D   Patient is on Antibody Plan: NON-HODGKINS  LYMPHOMA RITUXIMAB Q21D       REVIEW OF SYSTEMS:   Constitutional: Denies fevers, chills  Eyes: Denies blurriness of vision Ears, nose, mouth, throat, and face: Denies mucositis or sore throat Respiratory: Denies cough, dyspnea or wheezes Cardiovascular: Denies palpitation, chest discomfort Gastrointestinal:  Denies nausea, heartburn or change in bowel habits Skin: Denies abnormal skin rashes Lymphatics: Denies new lymphadenopathy or easy bruising Neurological: Denies dizziness and headache Behavioral/Psych: Mood is stable, no new changes  Extremities: No lower extremity edema All other systems were reviewed with the patient and are negative.  I have reviewed the past medical history, past surgical history, social history and family history with the patient and they are unchanged from previous note.   PHYSICAL EXAMINATION: ECOG PERFORMANCE STATUS: 1 - Symptomatic but completely ambulatory  Vitals:   01/17/21 2207 01/18/21 0641  BP: 122/71 121/75  Pulse: 83 74  Resp: 17 16  Temp: 97.7 F (36.5 C) 97.6 F (36.4 C)  SpO2: 96% 97%   There were no vitals filed for this visit.  Intake/Output from previous day: 05/16 0701 - 05/17 0700 In: 506.8 [P.O.:360; IV Piggyback:146.8] Out: -   GENERAL:alert, no distress and comfortable SKIN: skin color, texture, turgor are normal, no rashes or significant lesions EYES: normal, Conjunctiva are pink and non-injected, sclera clear OROPHARYNX:no exudate, no erythema and lips, buccal mucosa, and tongue normal  LUNGS: clear to auscultation and percussion with normal breathing effort HEART: regular rate & rhythm and no murmurs and no lower extremity edema ABDOMEN:abdomen soft, non-tender and normal bowel  sounds Musculoskeletal:no cyanosis of digits and no clubbing  NEURO: alert & oriented x 3 with fluent speech, no focal motor/sensory deficits  LABORATORY DATA:  I have reviewed the data as listed CMP Latest Ref Rng & Units 01/18/2021  01/17/2021 01/14/2021  Glucose 70 - 99 mg/dL 130(H) 95 93  BUN 8 - 23 mg/dL _0 Creatinine 0.44 - 1.00 mg/dL 0.30(L) 0.53 0.70  Sodium 135 - 145 mmol/L 140 138 138  Potassium 3.5 - 5.1 mmol/L 3.9 3.5 3.6  Chloride 98 - 111 mmol/L 109 108 104  CO2 22 - 32 mmol/L _1 Calcium 8.9 - 10.3 mg/dL 8.5(L) 8.4(L) 8.3(L)  Total Protein 6.5 - 8.1 g/dL 5.4(L) 5.5(L) 5.4(L)  Total Bilirubin 0.3 - 1.2 mg/dL 0.6 0.4 0.6  Alkaline Phos 38 - 126 U/L 79 81 106  AST 15 - 41 U/L _2 ALT 0 - 44 U/L _3 Lab Results  Component Value Date   WBC 6.6 01/18/2021   HGB 9.1 (L) 01/18/2021   HCT 28.9 (L) 01/18/2021   MCV 89.8 01/18/2021   PLT 376 01/18/2021   NEUTROABS 5.1 01/18/2021    No results found.  ASSESSMENT AND PLAN: 1) large B-cell lymphoma -CT abdomen/pelvis with contrast 10/21/2020 - "Acute colitis/enteritis of the cecum/ascending colon and the associated terminal loops of ileum. As was recently described on CT imaging, the anatomy of distal ileum, ileocecal valve, cecum is atypical and correlation with patient's prior surgical record would be useful. The acute inflammatory changes contribute to at least a partial small bowel obstruction. Primary concern of this inflammatory cystic mass/bowel of the right lower quadrant is malignancy given that the posterior margin is inseparable from the psoas muscle, adnexa, and the right ureter. Correlation with CEA may be useful. Inflammatory/infectious changes are also a consideration." -Colonoscopy performed 11/04/2020- "An infiltrative and ulcerated partially obstructing large mass was found at 85 cm proximal to the anus. 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  Her WBC and platelets remain normal.  Labs are adequate to proceed with day 2 of cycle #4 of her chemotherapy today as planned. -Will monitor daily CBC with differential and CMET. -Continue as needed antiemetics. -Continue MiraLAX and Senokot-S. -Lovenox for DVT prophylaxis. -Continue home medications. -Continue PPI and as needed Maalox for reflux symptoms. -Resent scheduling message again this morning to arrange for rituximab and Udenyca on 01/24/2021 and for a lab, visit, and possible PRBC transfusion on 01/27/2021 or 01/28/2021.   LOS: 1 day   Mikey Bussing, DNP, AGPCNP-BC, AOCNP 01/18/21  ADDENDUM  .Patient was Personally and independently interviewed, examined and relevant elements of the history of present illness were reviewed in details and an assessment and plan was created. All elements of the patient's history of present illness , assessment and plan were discussed in details with Mikey Bussing, DNP,  AGPCNP-BC, AOCNP. The above documentation reflects our combined findings assessment and plan.  Sullivan Lone MD MS

## 2021-01-18 NOTE — Progress Notes (Signed)
Initial Nutrition Assessment  INTERVENTION:   -Ensure Enlive po BID, each supplement provides 350 kcal and 20 grams of protein  -"High Calorie, High Protein" handout from [employer]  NUTRITION DIAGNOSIS:   Increased nutrient needs related to cancer and cancer related treatments as evidenced by estimated needs.  GOAL:   Patient will meet greater than or equal to 90% of their needs  MONITOR:   PO intake,Supplement acceptance,Weight trends,Labs,I & O's  REASON FOR ASSESSMENT:   Consult Assessment of nutrition requirement/status  ASSESSMENT:   75 y.o patient admitted to start chemotherapy for Large B-Cell Lymphoma. History of right breast cancer diagnosed in 2011 status post lumpectomy and 5 years of antiestrogen therapy.  Patient reports her appetite has been fluctuating. The past week she was unable to eat much at all d/t taste changes and lack of appetite. Appetite has improved since admission and she is consuming 100% of meals at this time. Provided "High Calorie, High Protein nutrition therapy" handout as pt is now starting chemotherapy.  Pt reports weight loss but no weight history in records to confirm.  Medications: OSCAL, Remeron, Multivitamin with minerals daily, Miralax, Senokot, Zofran  Labs reviewed.  NUTRITION - FOCUSED PHYSICAL EXAM:  No depletions noted.  Diet Order:   Diet Order            Diet regular Room service appropriate? Yes; Fluid consistency: Thin  Diet effective now                 EDUCATION NEEDS:   Education needs have been addressed  Skin:  Skin Assessment: Reviewed RN Assessment  Last BM:  5/16  Height:   Ht Readings from Last 1 Encounters:  01/14/21 5\' 6"  (1.676 m)    Weight:   Wt Readings from Last 1 Encounters:  01/14/21 57.1 kg     BMI:  20.2 kg/m^2  Estimated Nutritional Needs:   Kcal:  1700-1900  Protein:  85-95g  Fluid:  1.9L/day  Clayton Bibles, MS, RD, LDN Inpatient Clinical  Dietitian Contact information available via Amion

## 2021-01-19 DIAGNOSIS — C8510 Unspecified B-cell lymphoma, unspecified site: Secondary | ICD-10-CM | POA: Diagnosis not present

## 2021-01-19 LAB — CBC WITH DIFFERENTIAL/PLATELET
Abs Immature Granulocytes: 0.16 10*3/uL — ABNORMAL HIGH (ref 0.00–0.07)
Basophils Absolute: 0 10*3/uL (ref 0.0–0.1)
Basophils Relative: 0 %
Eosinophils Absolute: 0 10*3/uL (ref 0.0–0.5)
Eosinophils Relative: 0 %
HCT: 27.2 % — ABNORMAL LOW (ref 36.0–46.0)
Hemoglobin: 8.6 g/dL — ABNORMAL LOW (ref 12.0–15.0)
Immature Granulocytes: 2 %
Lymphocytes Relative: 8 %
Lymphs Abs: 0.7 10*3/uL (ref 0.7–4.0)
MCH: 28.8 pg (ref 26.0–34.0)
MCHC: 31.6 g/dL (ref 30.0–36.0)
MCV: 91 fL (ref 80.0–100.0)
Monocytes Absolute: 0.9 10*3/uL (ref 0.1–1.0)
Monocytes Relative: 9 %
Neutro Abs: 7.3 10*3/uL (ref 1.7–7.7)
Neutrophils Relative %: 81 %
Platelets: 380 10*3/uL (ref 150–400)
RBC: 2.99 MIL/uL — ABNORMAL LOW (ref 3.87–5.11)
RDW: 19.3 % — ABNORMAL HIGH (ref 11.5–15.5)
WBC: 9.1 10*3/uL (ref 4.0–10.5)
nRBC: 0 % (ref 0.0–0.2)

## 2021-01-19 LAB — COMPREHENSIVE METABOLIC PANEL
ALT: 20 U/L (ref 0–44)
AST: 17 U/L (ref 15–41)
Albumin: 2.6 g/dL — ABNORMAL LOW (ref 3.5–5.0)
Alkaline Phosphatase: 72 U/L (ref 38–126)
Anion gap: 4 — ABNORMAL LOW (ref 5–15)
BUN: 25 mg/dL — ABNORMAL HIGH (ref 8–23)
CO2: 26 mmol/L (ref 22–32)
Calcium: 8.5 mg/dL — ABNORMAL LOW (ref 8.9–10.3)
Chloride: 111 mmol/L (ref 98–111)
Creatinine, Ser: 0.62 mg/dL (ref 0.44–1.00)
GFR, Estimated: >60 mL/min (ref >60)
Glucose, Bld: 103 mg/dL — ABNORMAL HIGH (ref 70–99)
Potassium: 3.5 mmol/L (ref 3.5–5.1)
Sodium: 141 mmol/L (ref 135–145)
Total Bilirubin: 0.4 mg/dL (ref 0.3–1.2)
Total Protein: 5.2 g/dL — ABNORMAL LOW (ref 6.5–8.1)

## 2021-01-19 MED ORDER — VINCRISTINE SULFATE CHEMO INJECTION 1 MG/ML
Freq: Once | INTRAVENOUS | Status: AC
  Filled 2021-01-19: qty 8

## 2021-01-19 MED ORDER — SODIUM CHLORIDE 0.9 % IV SOLN
Freq: Once | INTRAVENOUS | Status: AC
  Administered 2021-01-19: 8 mg via INTRAVENOUS
  Filled 2021-01-19: qty 4

## 2021-01-20 DIAGNOSIS — C8510 Unspecified B-cell lymphoma, unspecified site: Secondary | ICD-10-CM | POA: Diagnosis not present

## 2021-01-20 LAB — CBC WITH DIFFERENTIAL/PLATELET
Abs Immature Granulocytes: 0.06 10*3/uL (ref 0.00–0.07)
Basophils Absolute: 0 10*3/uL (ref 0.0–0.1)
Basophils Relative: 0 %
Eosinophils Absolute: 0 10*3/uL (ref 0.0–0.5)
Eosinophils Relative: 0 %
HCT: 24.6 % — ABNORMAL LOW (ref 36.0–46.0)
Hemoglobin: 8 g/dL — ABNORMAL LOW (ref 12.0–15.0)
Immature Granulocytes: 1 %
Lymphocytes Relative: 8 %
Lymphs Abs: 0.5 10*3/uL — ABNORMAL LOW (ref 0.7–4.0)
MCH: 29.4 pg (ref 26.0–34.0)
MCHC: 32.5 g/dL (ref 30.0–36.0)
MCV: 90.4 fL (ref 80.0–100.0)
Monocytes Absolute: 0.5 10*3/uL (ref 0.1–1.0)
Monocytes Relative: 9 %
Neutro Abs: 4.7 10*3/uL (ref 1.7–7.7)
Neutrophils Relative %: 82 %
Platelets: 327 10*3/uL (ref 150–400)
RBC: 2.72 MIL/uL — ABNORMAL LOW (ref 3.87–5.11)
RDW: 19.1 % — ABNORMAL HIGH (ref 11.5–15.5)
WBC: 5.8 10*3/uL (ref 4.0–10.5)
nRBC: 0 % (ref 0.0–0.2)

## 2021-01-20 LAB — COMPREHENSIVE METABOLIC PANEL
ALT: 28 U/L (ref 0–44)
AST: 24 U/L (ref 15–41)
Albumin: 2.6 g/dL — ABNORMAL LOW (ref 3.5–5.0)
Alkaline Phosphatase: 69 U/L (ref 38–126)
Anion gap: 2 — ABNORMAL LOW (ref 5–15)
BUN: 25 mg/dL — ABNORMAL HIGH (ref 8–23)
CO2: 26 mmol/L (ref 22–32)
Calcium: 8.4 mg/dL — ABNORMAL LOW (ref 8.9–10.3)
Chloride: 113 mmol/L — ABNORMAL HIGH (ref 98–111)
Creatinine, Ser: 0.57 mg/dL (ref 0.44–1.00)
GFR, Estimated: >60 mL/min (ref >60)
Glucose, Bld: 92 mg/dL (ref 70–99)
Potassium: 3.7 mmol/L (ref 3.5–5.1)
Sodium: 141 mmol/L (ref 135–145)
Total Bilirubin: 0.6 mg/dL (ref 0.3–1.2)
Total Protein: 4.9 g/dL — ABNORMAL LOW (ref 6.5–8.1)

## 2021-01-20 MED ORDER — SODIUM CHLORIDE 0.9 % IV SOLN
Freq: Once | INTRAVENOUS | Status: AC
  Administered 2021-01-21: 36 mg via INTRAVENOUS
  Filled 2021-01-20: qty 8

## 2021-01-20 MED ORDER — ETOPOSIDE CHEMO INJECTION 500 MG/25ML
Freq: Once | INTRAVENOUS | Status: AC
  Filled 2021-01-20: qty 8

## 2021-01-20 MED ORDER — CYCLOPHOSPHAMIDE CHEMO INJECTION 1 GM
750.0000 mg/m2 | Freq: Once | INTRAMUSCULAR | Status: AC
  Administered 2021-01-21: 1220 mg via INTRAVENOUS
  Filled 2021-01-20: qty 61

## 2021-01-20 MED ORDER — MIRTAZAPINE 15 MG PO TABS: 15.0000 mg | ORAL_TABLET | Freq: Every day | ORAL | 1 refills | Status: DC

## 2021-01-20 MED ORDER — SODIUM CHLORIDE 0.9 % IV SOLN
Freq: Once | INTRAVENOUS | Status: AC
  Administered 2021-01-20: 8 mg via INTRAVENOUS
  Filled 2021-01-20: qty 4

## 2021-01-20 MED ORDER — HYDROCODONE-ACETAMINOPHEN 5-325 MG PO TABS: 1.0000 | ORAL_TABLET | Freq: Four times a day (QID) | ORAL | 0 refills | Status: DC | PRN

## 2021-01-20 NOTE — Progress Notes (Addendum)
HEMATOLOGY-ONCOLOGY PROGRESS NOTE  SUBJECTIVE: The patient tolerated day 3 of cycle #4 of her chemotherapy well overall.  Reports mild dizziness with standing this morning.  Reports mild reflux symptoms this morning.  Denies fevers and chills.  She denies mucositis, nausea, vomiting, constipation, diarrhea.  She ate a good breakfast. She ambulates in the hallway without any difficulty.  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 Biopsy showed invasive and in situ ductal carcinoma, HER-2 equivocal by IHC (2+), negative by FISH (ratio 1.59), ER+ >95%, PR+ 85%, Ki67 10%.    Malignant neoplasm of left breast in female, estrogen receptor positive ([place])  10/12/2020 Initial Diagnosis   Malignant neoplasm of left breast in female, estrogen receptor positive ([place])   10/12/2020 Cancer Staging   Staging form: Breast, AJCC 8th Edition - Clinical stage from 10/12/2020: Stage IA (cT1b, cN0, cM0, G2, ER+, PR+, HER2-) - Signed by Nicholas Lose, MD on 10/15/2020 Stage prefix: Initial diagnosis   11/05/2020 Surgery   Left lumpectomy: Grade 1 IDC, 1.1 cm, intermediate grade DCIS, margins are negative, lymph node -0/1, ER greater than 95%, PR 85%, HER-2 negative, Ki-67 10%   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[place])  11/11/2020 Initial Diagnosis   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[place])   11/15/2020 -  Chemotherapy    Patient is on Treatment Plan: IP NON-HODGKINS LYMPHOMA EPOCH Q21D   Patient is on Antibody Plan: NON-HODGKINS LYMPHOMA RITUXIMAB Q21D    11/19/2020 -  Chemotherapy    Patient is on Treatment Plan:  IP NON-HODGKINS LYMPHOMA EPOCH Q21D   Patient is on Antibody Plan: NON-HODGKINS LYMPHOMA RITUXIMAB Q21D       REVIEW OF SYSTEMS:   Constitutional: Denies fevers, chills  Eyes: Denies blurriness of vision Ears, nose, mouth, throat, and face: Denies mucositis or sore throat Respiratory: Denies cough, dyspnea or wheezes Cardiovascular: Denies palpitation, chest discomfort Gastrointestinal:  Denies nausea, heartburn or change in bowel habits Skin: Denies abnormal skin rashes Lymphatics: Denies new lymphadenopathy or easy bruising Neurological: Reports mild dizziness with standing Behavioral/Psych: Mood is stable, no new changes  Extremities: No lower extremity edema All other systems were reviewed with the patient and are negative.  I have reviewed the past medical history, past surgical history, social history and family history with the patient and they are unchanged from previous note.   PHYSICAL EXAMINATION: ECOG PERFORMANCE STATUS: 1 - Symptomatic but completely ambulatory  Vitals:   01/19/21 2111 01/20/21 0442  BP: 130/76 117/68  Pulse: 60 73  Resp: 18 18  Temp:  97.7 F (36.5 C)  SpO2: 98% 96%   There were no vitals filed for this visit.  Intake/Output from previous day: 05/18 0701 - 05/19 0700 In: 1318.7 [I.V.:456.8; IV Piggyback:861.9] Out: -   GENERAL:alert, no distress and comfortable SKIN: skin color, texture, turgor are normal, no rashes or significant lesions EYES: normal, Conjunctiva are pink and non-injected, sclera clear OROPHARYNX:no exudate, no erythema and lips, buccal mucosa, and tongue normal  LUNGS: clear to auscultation and percussion with normal breathing effort HEART: regular rate & rhythm and no murmurs  and no lower extremity edema ABDOMEN:abdomen soft, non-tender and normal bowel sounds Musculoskeletal:no cyanosis of digits and no clubbing  NEURO: alert & oriented x 3 with fluent speech, no focal motor/sensory deficits  LABORATORY DATA:  I  have reviewed the data as listed CMP Latest Ref Rng & Units 01/20/2021 01/19/2021 01/18/2021  Glucose 70 - 99 mg/dL 92 103(H) 130(H)  BUN 8 - 23 mg/dL 25(H) 25(H) 15  Creatinine 0.44 - 1.00 mg/dL 0.57 0.62 0.30(L)  Sodium 135 - 145 mmol/L 141 141 140  Potassium 3.5 - 5.1 mmol/L 3.7 3.5 3.9  Chloride 98 - 111 mmol/L 113(H) 111 109  CO2 22 - 32 mmol/L $RemoveB'26 26 24  'zXvWWcTc$ Calcium 8.9 - 10.3 mg/dL 8.4(L) 8.5(L) 8.5(L)  Total Protein 6.5 - 8.1 g/dL 4.9(L) 5.2(L) 5.4(L)  Total Bilirubin 0.3 - 1.2 mg/dL 0.6 0.4 0.6  Alkaline Phos 38 - 126 U/L 69 72 79  AST 15 - 41 U/L $Remo'24 17 16  'KIkgs$ ALT 0 - 44 U/L $Remo'28 20 16    'CWUrn$ Lab Results  Component Value Date   WBC 5.8 01/20/2021   HGB 8.0 (L) 01/20/2021   HCT 24.6 (L) 01/20/2021   MCV 90.4 01/20/2021   PLT 327 01/20/2021   NEUTROABS 4.7 01/20/2021    No results found.  ASSESSMENT AND PLAN: 1) large B-cell lymphoma -CT abdomen/pelvis with contrast 10/21/2020 - "Acute colitis/enteritis of the cecum/ascending colon and the associated terminal loops of ileum. As was recently described on CT imaging, the anatomy of distal ileum, ileocecal valve, cecum is atypical and correlation with patient's prior surgical record would be useful. The acute inflammatory changes contribute to at least a partial small bowel obstruction. Primary concern of this inflammatory cystic mass/bowel of the right lower quadrant is malignancy given that the posterior margin is inseparable from the psoas muscle, adnexa, and the right ureter. Correlation with CEA may be useful. Inflammatory/infectious changes are also a consideration." -Colonoscopy performed 11/04/2020- "An infiltrative and ulcerated partially obstructing large mass was found at 85 cm proximal to the anus. 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 May need PRBC transfusion in am if hemoglobin <8 or if persistent lightheadedness. Will add type and screen to am labs. Her WBC and platelets remain normal.  Labs are adequate to proceed with day 4 of cycle #4 of her chemotherapy today as planned. -Will monitor daily CBC with differential and CMET. -Continue as needed antiemetics. -Continue MiraLAX and Senokot-S. -Lovenox for DVT prophylaxis. -Continue home medications. -Continue PPI and as needed Maalox for reflux symptoms. -Follow up for rituximab and Udenyca on 01/24/2021 and for a lab and visit on 01/28/2021. Possible PRBC transfusion 01/28/2021.    LOS: 3 days   Mikey Bussing, DNP, AGPCNP-BC, AOCNP 01/20/21  ADDENDUM  .Patient was Personally and independently interviewed, examined and relevant elements of the history of present illness were reviewed in details and an  assessment and plan was created. All elements of the patient's history of present illness , assessment and plan were discussed in details with Mikey Bussing, DNP, AGPCNP-BC, AOCNP. The above documentation reflects our combined findings assessment and plan.  Sullivan Lone MD MS

## 2021-01-20 NOTE — Progress Notes (Signed)
HEMATOLOGY-ONCOLOGY PROGRESS NOTE  SUBJECTIVE: The patient tolerated day 3 of cycle #4 of her chemotherapy well overall.  She has no acute new complaints this evening.  Denies fevers and chills.  She denies mucositis, nausea, vomiting, constipation, diarrhea.  She ambulates in the hallway without any difficulty.  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 Biopsy showed invasive and in situ ductal carcinoma, HER-2 equivocal by IHC (2+), negative by FISH (ratio 1.59), ER+ >95%, PR+ 85%, Ki67 10%.    Malignant neoplasm of left breast in female, estrogen receptor positive ([place])  10/12/2020 Initial Diagnosis   Malignant neoplasm of left breast in female, estrogen receptor positive ([place])   10/12/2020 Cancer Staging   Staging form: Breast, AJCC 8th Edition - Clinical stage from 10/12/2020: Stage IA (cT1b, cN0, cM0, G2, ER+, PR+, HER2-) - Signed by Nicholas Lose, MD on 10/15/2020 Stage prefix: Initial diagnosis   11/05/2020 Surgery   Left lumpectomy: Grade 1 IDC, 1.1 cm, intermediate grade DCIS, margins are negative, lymph node -0/1, ER greater than 95%, PR 85%, HER-2 negative, Ki-67 10%   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[place])  11/11/2020 Initial Diagnosis   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[place])   11/15/2020 -  Chemotherapy    Patient is on Treatment Plan: IP NON-HODGKINS LYMPHOMA EPOCH Q21D   Patient is on Antibody Plan: NON-HODGKINS LYMPHOMA RITUXIMAB Q21D    11/19/2020 -  Chemotherapy    Patient is on Treatment Plan: IP NON-HODGKINS LYMPHOMA EPOCH Q21D   Patient is on Antibody Plan:  NON-HODGKINS LYMPHOMA RITUXIMAB Q21D      REVIEW OF SYSTEMS:   Constitutional: Denies fevers, chills  Eyes: Denies blurriness of vision Ears, nose, mouth, throat, and face: Denies mucositis or sore throat Respiratory: Denies cough, dyspnea or wheezes Cardiovascular: Denies palpitation, chest discomfort Gastrointestinal:  Denies nausea, heartburn or change in bowel habits Skin: Denies abnormal skin rashes Lymphatics: Denies new lymphadenopathy or easy bruising Neurological: Denies dizziness and headache Behavioral/Psych: Mood is stable, no new changes  Extremities: No lower extremity edema All other systems were reviewed with the patient and are negative.  I have reviewed the past medical history, past surgical history, social history and family history with the patient and they are unchanged from previous note.   PHYSICAL EXAMINATION: ECOG PERFORMANCE STATUS: 1 - Symptomatic but completely ambulatory  Vitals:   01/19/21 2111 01/20/21 0442  BP: 130/76 117/68  Pulse: 60 73  Resp: 18 18  Temp:  97.7 F (36.5 C)  SpO2: 98% 96%   There were no vitals filed for this visit.  Intake/Output from previous day: 05/18 0701 - 05/19 0700 In: 1318.7 [I.V.:456.8; IV Piggyback:861.9] Out: -   GENERAL:alert, no distress and comfortable SKIN: skin color, texture, turgor are normal, no rashes or significant lesions EYES: normal, Conjunctiva are pink and non-injected, sclera clear OROPHARYNX:no exudate, no erythema and lips, buccal mucosa, and tongue normal  LUNGS: clear to auscultation and percussion with normal breathing effort HEART: regular rate & rhythm and no murmurs and no lower extremity edema ABDOMEN:abdomen soft, non-tender and normal bowel sounds Musculoskeletal:no  cyanosis of digits and no clubbing  NEURO: alert & oriented x 3 with fluent speech, no focal motor/sensory deficits  LABORATORY DATA:  I have reviewed the data as listed CMP Latest Ref Rng & Units 01/20/2021  01/19/2021 01/18/2021  Glucose 70 - 99 mg/dL 92 103(H) 130(H)  BUN 8 - 23 mg/dL 25(H) 25(H) 15  Creatinine 0.44 - 1.00 mg/dL 0.57 0.62 0.30(L)  Sodium 135 - 145 mmol/L 141 141 140  Potassium 3.5 - 5.1 mmol/L 3.7 3.5 3.9  Chloride 98 - 111 mmol/L 113(H) 111 109  CO2 22 - 32 mmol/L $RemoveB'26 26 24  'xwoBTVmm$ Calcium 8.9 - 10.3 mg/dL 8.4(L) 8.5(L) 8.5(L)  Total Protein 6.5 - 8.1 g/dL 4.9(L) 5.2(L) 5.4(L)  Total Bilirubin 0.3 - 1.2 mg/dL 0.6 0.4 0.6  Alkaline Phos 38 - 126 U/L 69 72 79  AST 15 - 41 U/L $Remo'24 17 16  'GuQLq$ ALT 0 - 44 U/L $Remo'28 20 16    'xYGJR$ Lab Results  Component Value Date   WBC 5.8 01/20/2021   HGB 8.0 (L) 01/20/2021   HCT 24.6 (L) 01/20/2021   MCV 90.4 01/20/2021   PLT 327 01/20/2021   NEUTROABS 4.7 01/20/2021    No results found.  ASSESSMENT AND PLAN:  1) Large B-cell lymphoma involving bowels -CT abdomen/pelvis with contrast 10/21/2020 - "Acute colitis/enteritis of the cecum/ascending colon and the associated terminal loops of ileum. As was recently described on CT imaging, the anatomy of distal ileum, ileocecal valve, cecum is atypical and correlation with patient's prior surgical record would be useful. The acute inflammatory changes contribute to at least a partial small bowel obstruction. Primary concern of this inflammatory cystic mass/bowel of the right lower quadrant is malignancy given that the posterior margin is inseparable from the psoas muscle, adnexa, and the right ureter. Correlation with CEA may be useful. Inflammatory/infectious changes are also a consideration." -Colonoscopy performed 11/04/2020- "An infiltrative and ulcerated partially obstructing large mass was found at 85 cm proximal to the anus. 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 Labs are adequate to proceed with day 3 of cycle #4 of her chemotherapy today as planned. -Will monitor daily CBC with differential and CMET. -Continue as needed antiemetics. -Continue MiraLAX and Senokot-S. -Lovenox for DVT prophylaxis. -Continue home medications. -encourage to maintain good po fluid and food intake. -Continue PPI and as needed Maalox for reflux symptoms. -Resent scheduling message again this morning to arrange for rituximab and Udenyca on 01/24/2021 and for a lab, visit, and possible PRBC transfusion on 01/27/2021 or 01/28/2021.   LOS: 3 days   Lindsey Price

## 2021-01-20 NOTE — Care Management Important Message (Signed)
Important Message  Patient Details IM Letter given to the Patient. Name: [name] MRN: [id number] Date of Birth: 15-Jan-1946   Medicare Important Message Given:  Yes     Kerin Salen 01/20/2021, 10:08 AM

## 2021-01-21 ENCOUNTER — Telehealth: Payer: Self-pay | Admitting: Hematology

## 2021-01-21 DIAGNOSIS — C8510 Unspecified B-cell lymphoma, unspecified site: Secondary | ICD-10-CM | POA: Diagnosis not present

## 2021-01-21 LAB — CBC WITH DIFFERENTIAL/PLATELET
Abs Immature Granulocytes: 0.03 10*3/uL (ref 0.00–0.07)
Basophils Absolute: 0 10*3/uL (ref 0.0–0.1)
Basophils Relative: 0 %
Eosinophils Absolute: 0 10*3/uL (ref 0.0–0.5)
Eosinophils Relative: 0 %
HCT: 24.4 % — ABNORMAL LOW (ref 36.0–46.0)
Hemoglobin: 8 g/dL — ABNORMAL LOW (ref 12.0–15.0)
Immature Granulocytes: 1 %
Lymphocytes Relative: 10 %
Lymphs Abs: 0.5 10*3/uL — ABNORMAL LOW (ref 0.7–4.0)
MCH: 29 pg (ref 26.0–34.0)
MCHC: 32.8 g/dL (ref 30.0–36.0)
MCV: 88.4 fL (ref 80.0–100.0)
Monocytes Absolute: 0.2 10*3/uL (ref 0.1–1.0)
Monocytes Relative: 4 %
Neutro Abs: 3.9 10*3/uL (ref 1.7–7.7)
Neutrophils Relative %: 85 %
Platelets: 344 10*3/uL (ref 150–400)
RBC: 2.76 MIL/uL — ABNORMAL LOW (ref 3.87–5.11)
RDW: 18.4 % — ABNORMAL HIGH (ref 11.5–15.5)
WBC: 4.6 10*3/uL (ref 4.0–10.5)
nRBC: 0 % (ref 0.0–0.2)

## 2021-01-21 LAB — COMPREHENSIVE METABOLIC PANEL
ALT: 45 U/L — ABNORMAL HIGH (ref 0–44)
AST: 28 U/L (ref 15–41)
Albumin: 2.7 g/dL — ABNORMAL LOW (ref 3.5–5.0)
Alkaline Phosphatase: 70 U/L (ref 38–126)
Anion gap: 3 — ABNORMAL LOW (ref 5–15)
BUN: 24 mg/dL — ABNORMAL HIGH (ref 8–23)
CO2: 28 mmol/L (ref 22–32)
Calcium: 8.2 mg/dL — ABNORMAL LOW (ref 8.9–10.3)
Chloride: 113 mmol/L — ABNORMAL HIGH (ref 98–111)
Creatinine, Ser: 0.57 mg/dL (ref 0.44–1.00)
GFR, Estimated: >60 mL/min (ref >60)
Glucose, Bld: 93 mg/dL (ref 70–99)
Potassium: 3.6 mmol/L (ref 3.5–5.1)
Sodium: 144 mmol/L (ref 135–145)
Total Bilirubin: 0.8 mg/dL (ref 0.3–1.2)
Total Protein: 5.1 g/dL — ABNORMAL LOW (ref 6.5–8.1)

## 2021-01-21 LAB — PREPARE RBC (CROSSMATCH)

## 2021-01-21 MED ORDER — SODIUM CHLORIDE 0.9% IV SOLUTION: Freq: Once | INTRAVENOUS | Status: AC

## 2021-01-21 MED ORDER — HEPARIN SOD (PORK) LOCK FLUSH 100 UNIT/ML IV SOLN
500.0000 [IU] | Freq: Once | INTRAVENOUS | Status: AC
  Administered 2021-01-21: 500 [IU] via INTRAVENOUS
  Filled 2021-01-21: qty 5

## 2021-01-21 NOTE — Discharge Summary (Addendum)
Discharge Summary  Patient ID: Lindsey Price MRN: [id number] DOB/AGE: May 18, 1946 75 y.o.  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[place])   Encounter for antineoplastic chemotherapy  Discharged Condition: good  Discharge Labs:   CBC    Component Value Date/Time   WBC 4.6 01/21/2021 0523   RBC 2.76 (L) 01/21/2021 0523   HGB 8.0 (L) 01/21/2021 0523   HGB 9.6 (L) 01/14/2021 0840   HGB 13.7 02/06/2019 0908   HGB 14.6 05/15/2014 0908   HCT 24.4 (L) 01/21/2021 0523   HCT 41.3 02/06/2019 0908   HCT 44.9 05/15/2014 0908   PLT 344 01/21/2021 0523   PLT 290 01/14/2021 0840   PLT 322 02/06/2019 0908   MCV 88.4 01/21/2021 0523   MCV 91 02/06/2019 0908   MCV 95.6 05/15/2014 0908   MCH 29.0 01/21/2021 0523   MCHC 32.8 01/21/2021 0523   RDW 18.4 (H) 01/21/2021 0523   RDW 13.1 02/06/2019 0908   RDW 12.7 05/15/2014 0908   LYMPHSABS 0.5 (L) 01/21/2021 0523   LYMPHSABS 1.3 02/06/2019 0908   LYMPHSABS 1.5 05/15/2014 0908   MONOABS 0.2 01/21/2021 0523   MONOABS 0.5 05/15/2014 0908   EOSABS 0.0 01/21/2021 0523   EOSABS 0.2 02/06/2019 0908   EOSABS 0.1 05/27/2010 1030   BASOSABS 0.0 01/21/2021 0523   BASOSABS 0.0 02/06/2019 0908   BASOSABS 0.0 05/15/2014 0908   CMP Latest Ref Rng & Units 01/21/2021 01/20/2021 01/19/2021  Glucose 70 - 99 mg/dL 93 92 103(H)  BUN 8 - 23 mg/dL 24(H) 25(H) 25(H)  Creatinine 0.44 - 1.00 mg/dL 0.57 0.57 0.62  Sodium 135 - 145 mmol/L 144 141 141  Potassium 3.5 - 5.1 mmol/L 3.6 3.7 3.5  Chloride 98 - 111 mmol/L 113(H) 113(H) 111  CO2 22 - 32 mmol/L _0 Calcium 8.9 - 10.3 mg/dL 8.2(L) 8.4(L) 8.5(L)  Total Protein 6.5 - 8.1 g/dL 5.1(L) 4.9(L) 5.2(L)  Total Bilirubin 0.3 - 1.2 mg/dL 0.8 0.6 0.4  Alkaline Phos 38 - 126 U/L 70 69 72  AST 15 - 41 U/L _1 ALT 0 - 44 U/L 45(H) 28 20    Significant Diagnostic Studies: None  Consults: None  Procedures:  None  Disposition:  Discharge disposition: 01-Home or Self Care      Allergies as of 01/21/2021      Reactions   Advair Diskus [fluticasone-salmeterol] Other (See Comments)   Hoarseness.   Neosporin [neomycin-polymyxin-gramicidin] Itching      Medication List    TAKE these medications   albuterol 108 (90 Base) MCG/ACT inhaler Commonly known as: ProAir HFA Inhale 1-2 puffs into the lungs every 6 (six) hours as needed for wheezing or shortness of breath.   alum & mag hydroxide-simeth 200-200-20 MG/5ML suspension Commonly known as: MAALOX/MYLANTA Take 30 mLs by mouth every 4 (four) hours as needed for indigestion.   atorvastatin 10 MG tablet Commonly known as: LIPITOR Take 1 tablet (10 mg total) by mouth at bedtime.   calcium carbonate 1250 (500 Ca) MG tablet Commonly known as: OS-CAL - dosed in mg of elemental calcium Take 1,250 mg by mouth daily.   cyanocobalamin 1000 MCG/ML injection Commonly known as: (VITAMIN B-12) Inject 1 mL (1,000 mcg total) into the skin every 30 (thirty) days.   dexamethasone 4 MG tablet Commonly known as: DECADRON Take 2 tablets (8 mg total) by mouth 2 (two) times daily with a meal. Take two times  a day starting the day after chemotherapy for 3 days.   esomeprazole 20 MG capsule Commonly known as: NEXIUM Take 20 mg by mouth daily at 12 noon.   ezetimibe 10 MG tablet Commonly known as: Zetia Take 1 tablet (10 mg total) by mouth daily.   famotidine 20 MG tablet Commonly known as: PEPCID TAKE 1 TABLET (20 MG TOTAL) BY MOUTH 2 (TWO) TIMES DAILY AS NEEDED FOR HEARTBURN OR INDIGESTION.   feeding supplement Liqd Take 237 mLs by mouth 2 (two) times daily between meals.   fluticasone 50 MCG/ACT nasal spray Commonly known as: FLONASE PLACE 1 SPRAY INTO BOTH NOSTRILS DAILY.   HYDROcodone-acetaminophen 5-325 MG tablet Commonly known as: NORCO/VICODIN Take 1-2 tablets by mouth every 6 (six) hours as needed for moderate pain or severe pain.    levocetirizine 5 MG tablet Commonly known as: XYZAL Take 5 mg by mouth daily.   levothyroxine 88 MCG tablet Commonly known as: SYNTHROID * What changed:   how much to take  how to take this  when to take this  additional instructions   levothyroxine 100 MCG tablet Commonly known as: SYNTHROID TAKE 1 TABLET EVERY OTHER DAY ALTERNATING WITH 88 MCG TABLET What changed:   how much to take  how to take this  when to take this  additional instructions   lidocaine-prilocaine cream Commonly known as: EMLA Apply 1 application topically as needed.   LORazepam 0.5 MG tablet Commonly known as: Ativan Take 1 tablet (0.5 mg total) by mouth every 6 (six) hours as needed (Nausea or vomiting). What changed: reasons to take this   magic mouthwash w/lidocaine Soln Take 5 mLs by mouth 4 (four) times daily as needed for mouth pain. Swish and swallow 1 part diphenhydramine 12.5 mg per 5 mL elixir (19m), 1 part Maalox (do not substitute Kaopectate) 457m 1 part 2% viscous lidocaine (40 ml), 1 part nystatin 500000 units/ml (405mand 1 part distilled water (89m8mPlz dispense 200 ml   mirtazapine 15 MG tablet Commonly known as: REMERON Take 1 tablet (15 mg total) by mouth at bedtime. What changed:   medication strength  how much to take   montelukast 10 MG tablet Commonly known as: SINGULAIR * What changed:   how much to take  how to take this  when to take this  additional instructions   multivitamin with minerals Tabs tablet Take 1 tablet by mouth daily.   ondansetron 8 MG tablet Commonly known as: Zofran Take 1 tablet (8 mg total) by mouth 2 (two) times daily. Take two times a day starting the day after chemo for 3 days. Then take two times a day as needed for nausea or vomiting. What changed: when to take this   polyethylene glycol 17 g packet Commonly known as: MIRALAX / GLYCOLAX Take 17 g by mouth daily.   prochlorperazine 10 MG tablet Commonly known  as: COMPAZINE Take 1 tablet (10 mg total) by mouth every 6 (six) hours as needed (Nausea or vomiting).   prochlorperazine 25 MG suppository Commonly known as: COMPAZINE Place 1 suppository (25 mg total) rectally every 12 (twelve) hours as needed for nausea.   senna-docusate 8.6-50 MG tablet Commonly known as: Senokot-S Take 1 tablet by mouth 2 (two) times daily.   sodium bicarbonate/sodium chloride Soln 1 application by Mouth Rinse route as needed for dry mouth.   venlafaxine XR 37.5 MG 24 hr capsule Commonly known as: EFFEXOR-XR TAKE 1 CAPSULE BY MOUTH DAILY WITH BREAKFAST.   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  The patient has been being followed by Dr. Lindi Adie in our office for her breast cancer.  The patient had a CT of the abdomen/pelvis with contrast due to abdominal pain performed on 10/21/2020 which showed acute colitis/enteritis of the cecum/ascending colon and the associated terminal loops of ileum, acute inflammatory changes contribute to at least a partial small bowel obstruction, primary concern of this inflammatory cystic mass/bowel of the right lower quadrant is malignancy given that the posterior margin is inseparable from the psoas muscle, adnexa, and right ureter.  A colonoscopy was performed on  11/04/2020 which showed an infiltrative and ulcerated partially obstructing large mass was found at 85 cm proximal to the anus. The mass was circumferential.  This mass was biopsied and was consistent with large B-cell lymphoma.  Due to this new finding, the patient was referred to Dr. Irene Limbo for consideration of systemic chemotherapy for treatment of her B-cell lymphoma.  Hospital Course: The patient was admitted on 01/17/2021 to begin cycle #4 of EPOCH-R.  On the day of admission, she reported feeling well overall.  She was not having any fevers, chills, abdominal pain, nausea, vomiting, constipation, diarrhea.  Labs in the day of admission were adequate for treatment.  She started day 1 of cycle #4 of her chemotherapy as planned on 01/17/2021.  During the hospital admission, she tolerated chemotherapy well with no significant side effects.  She received vitamin B12 1000 mcg subcu on the day of admission.  She was seen on the morning of admission and was not having any issues with mucositis, nausea, vomiting, constipation.  Her exam remained unchanged.  Labs from the morning of discharge were reviewed.  Her hemoglobin is down to 8.0 and will give 1 unit PRBCs prior to discharge.  She is stable for discharged home once chemotherapy is completed and she received 1 unit PRBCs.  She will follow-up in our office on 01/24/2021 for rituximab and Udenyca.  She will have a lab and follow-up visit on 01/26/2021.  She is scheduled for possible blood transfusion on 01/28/2021.  Discharge Instructions    Diet general   Complete by: As directed    Increase activity slowly   Complete by: As directed      Signed: Mikey Bussing 01/21/2021, 9:17 AM    ADDENDUM  Patient was Personally and independently interviewed, examined and relevant elements of the discharge plan were reviewed in details and an assessment and plan was created. All elements of the patient's discharge plan were discussed in details with Mikey Bussing  DNP. The above documentation reflects our combined findings  and plan.  Sullivan Lone MD MS TT discharging patient>30 mins

## 2021-01-21 NOTE — Telephone Encounter (Signed)
Scheduled follow-up appointments per 5/13 los. Patient is aware. 

## 2021-01-24 ENCOUNTER — Inpatient Hospital Stay (HOSPITAL_BASED_OUTPATIENT_CLINIC_OR_DEPARTMENT_OTHER): Payer: PPO

## 2021-01-24 ENCOUNTER — Other Ambulatory Visit: Payer: Self-pay

## 2021-01-24 ENCOUNTER — Encounter: Payer: Self-pay | Admitting: *Deleted

## 2021-01-24 VITALS — BP 133/69 | HR 75 | Temp 98.1°F | Resp 20 | Wt 128.5 lb

## 2021-01-24 DIAGNOSIS — Z7189 Other specified counseling: Secondary | ICD-10-CM | POA: Diagnosis not present

## 2021-01-24 DIAGNOSIS — C8510 Unspecified B-cell lymphoma, unspecified site: Secondary | ICD-10-CM

## 2021-01-24 DIAGNOSIS — C8519 Unspecified B-cell lymphoma, extranodal and solid organ sites: Secondary | ICD-10-CM | POA: Diagnosis not present

## 2021-01-24 DIAGNOSIS — Z5112 Encounter for antineoplastic immunotherapy: Secondary | ICD-10-CM | POA: Diagnosis not present

## 2021-01-24 LAB — BPAM RBC
Blood Product Expiration Date: 202206152359
ISSUE DATE / TIME: 202205201138
Unit Type and Rh: 5100

## 2021-01-24 LAB — TYPE AND SCREEN
ABO/RH(D): O POS
Antibody Screen: NEGATIVE
Unit division: 0

## 2021-01-24 MED ORDER — SODIUM CHLORIDE 0.9% FLUSH
10.0000 mL | INTRAVENOUS | Status: DC | PRN
  Administered 2021-01-24: 10 mL
  Filled 2021-01-24: qty 10

## 2021-01-24 MED ORDER — SODIUM CHLORIDE 0.9 % IV SOLN
375.0000 mg/m2 | Freq: Once | INTRAVENOUS | Status: AC
  Administered 2021-01-24: 600 mg via INTRAVENOUS
  Filled 2021-01-24: qty 50

## 2021-01-24 MED ORDER — FAMOTIDINE 20 MG IN NS 100 ML IVPB
INTRAVENOUS | Status: AC
  Filled 2021-01-24: qty 100

## 2021-01-24 MED ORDER — DIPHENHYDRAMINE HCL 25 MG PO CAPS
ORAL_CAPSULE | ORAL | Status: AC
  Filled 2021-01-24: qty 2

## 2021-01-24 MED ORDER — SODIUM CHLORIDE 0.9 % IV SOLN
Freq: Once | INTRAVENOUS | Status: AC
  Filled 2021-01-24: qty 250

## 2021-01-24 MED ORDER — METHYLPREDNISOLONE SODIUM SUCC 125 MG IJ SOLR
60.0000 mg | Freq: Every day | INTRAMUSCULAR | Status: DC
  Administered 2021-01-24: 60 mg via INTRAVENOUS

## 2021-01-24 MED ORDER — PEGFILGRASTIM-CBQV 6 MG/0.6ML ~~LOC~~ SOSY
6.0000 mg | PREFILLED_SYRINGE | Freq: Once | SUBCUTANEOUS | Status: AC
  Administered 2021-01-24: 6 mg via SUBCUTANEOUS

## 2021-01-24 MED ORDER — HEPARIN SOD (PORK) LOCK FLUSH 100 UNIT/ML IV SOLN
500.0000 [IU] | Freq: Once | INTRAVENOUS | Status: AC | PRN
  Administered 2021-01-24: 500 [IU]
  Filled 2021-01-24: qty 5

## 2021-01-24 MED ORDER — FAMOTIDINE 20 MG IN NS 100 ML IVPB
20.0000 mg | Freq: Once | INTRAVENOUS | Status: AC
  Administered 2021-01-24: 20 mg via INTRAVENOUS

## 2021-01-24 MED ORDER — METHYLPREDNISOLONE SODIUM SUCC 125 MG IJ SOLR
INTRAMUSCULAR | Status: AC
  Filled 2021-01-24: qty 2

## 2021-01-24 MED ORDER — PEGFILGRASTIM-CBQV 6 MG/0.6ML ~~LOC~~ SOSY
PREFILLED_SYRINGE | SUBCUTANEOUS | Status: AC
  Filled 2021-01-24: qty 0.6

## 2021-01-24 MED ORDER — ACETAMINOPHEN 325 MG PO TABS
ORAL_TABLET | ORAL | Status: AC
  Filled 2021-01-24: qty 2

## 2021-01-24 MED ORDER — DIPHENHYDRAMINE HCL 25 MG PO CAPS
50.0000 mg | ORAL_CAPSULE | Freq: Once | ORAL | Status: AC
  Administered 2021-01-24: 50 mg via ORAL

## 2021-01-24 MED ORDER — ACETAMINOPHEN 325 MG PO TABS
650.0000 mg | ORAL_TABLET | Freq: Once | ORAL | Status: AC
  Administered 2021-01-24: 650 mg via ORAL

## 2021-01-24 NOTE — Patient Instructions (Signed)
[facility]  Discharge Instructions: Thank you for choosing [facility] to provide your oncology and hematology care.   If you have a lab appointment with the [facility], please go directly to the [facility] and check in at the registration area.   Wear comfortable clothing and clothing appropriate for easy access to any Portacath or PICC line.   We strive to give you quality time with your provider. You may need to reschedule your appointment if you arrive late (15 or more minutes).  Arriving late affects you and other patients whose appointments are after yours.  Also, if you miss three or more appointments without notifying the office, you may be dismissed from the clinic at the provider's discretion.      For prescription refill requests, have your pharmacy contact our office and allow 72 hours for refills to be completed.    Today you received the following chemotherapy and/or immunotherapy agent: Rituximab      To help prevent nausea and vomiting after your treatment, we encourage you to take your nausea medication as directed.  BELOW ARE SYMPTOMS THAT SHOULD BE REPORTED IMMEDIATELY: . *FEVER GREATER THAN 100.4 F (38 C) OR HIGHER . *CHILLS OR SWEATING . *NAUSEA AND VOMITING THAT IS NOT CONTROLLED WITH YOUR NAUSEA MEDICATION . *UNUSUAL SHORTNESS OF BREATH . *UNUSUAL BRUISING OR BLEEDING . *URINARY PROBLEMS (pain or burning when urinating, or frequent urination) . *BOWEL PROBLEMS (unusual diarrhea, constipation, pain near the anus) . TENDERNESS IN MOUTH AND THROAT WITH OR WITHOUT PRESENCE OF ULCERS (sore throat, sores in mouth, or a toothache) . UNUSUAL RASH, SWELLING OR PAIN  . UNUSUAL VAGINAL DISCHARGE OR ITCHING   Items with * indicate a potential emergency and should be followed up as soon as possible or go to the Emergency Department if any problems should occur.  Please show the CHEMOTHERAPY ALERT CARD or IMMUNOTHERAPY ALERT  CARD at check-in to the Emergency Department and triage nurse.  Should you have questions after your visit or need to cancel or reschedule your appointment, please contact [facility]  Dept: [phone]  and follow the prompts.  Office hours are 8:00 a.m. to 4:30 p.m. Monday - Friday. Please note that voicemails left after 4:00 p.m. may not be returned until the following business day.  We are closed weekends and major holidays. You have access to a nurse at all times for urgent questions. Please call the main number to the clinic Dept: [phone] and follow the prompts.   For any non-urgent questions, you may also contact your provider using MyChart. We now offer e-Visits for anyone 78 and older to request care online for non-urgent symptoms. For details visit [email or web address].   Also download the MyChart app! Go to the app store, search "MyChart", open the app, select [facility], and log in with your MyChart username and password.  Due to Covid, a mask is required upon entering the hospital/clinic. If you do not have a mask, one will be given to you upon arrival. For doctor visits, patients may have 1 support person aged 89 or older with them. For treatment visits, patients cannot have anyone with them due to current Covid guidelines and our immunocompromised population.   Pegfilgrastim injection What is this medicine? PEGFILGRASTIM (PEG fil gra stim) is a long-acting granulocyte colony-stimulating factor that stimulates the growth of neutrophils, a type of white blood cell important in the body's fight against infection. It is used  to reduce the incidence of fever and infection in patients with certain types of cancer who are receiving chemotherapy that affects the bone marrow, and to increase survival after being exposed to high doses of radiation. This medicine may be used for other purposes; ask your health care provider or pharmacist if you have  questions. COMMON BRAND NAME(S): Rexene Edison, Ziextenzo What should I tell my health care provider before I take this medicine? They need to know if you have any of these conditions:  kidney disease  latex allergy  ongoing radiation therapy  sickle cell disease  skin reactions to acrylic adhesives (On-Body Injector only)  an unusual or allergic reaction to pegfilgrastim, filgrastim, other medicines, foods, dyes, or preservatives  pregnant or trying to get pregnant  breast-feeding How should I use this medicine? This medicine is for injection under the skin. If you get this medicine at home, you will be taught how to prepare and give the pre-filled syringe or how to use the On-body Injector. Refer to the patient Instructions for Use for detailed instructions. Use exactly as directed. Tell your healthcare provider immediately if you suspect that the On-body Injector may not have performed as intended or if you suspect the use of the On-body Injector resulted in a missed or partial dose. It is important that you put your used needles and syringes in a special sharps container. Do not put them in a trash can. If you do not have a sharps container, call your pharmacist or healthcare provider to get one. Talk to your pediatrician regarding the use of this medicine in children. While this drug may be prescribed for selected conditions, precautions do apply. Overdosage: If you think you have taken too much of this medicine contact a poison control center or emergency room at once. NOTE: This medicine is only for you. Do not share this medicine with others. What if I miss a dose? It is important not to miss your dose. Call your doctor or health care professional if you miss your dose. If you miss a dose due to an On-body Injector failure or leakage, a new dose should be administered as soon as possible using a single prefilled syringe for manual use. What may interact  with this medicine? Interactions have not been studied. This list may not describe all possible interactions. Give your health care provider a list of all the medicines, herbs, non-prescription drugs, or dietary supplements you use. Also tell them if you smoke, drink alcohol, or use illegal drugs. Some items may interact with your medicine. What should I watch for while using this medicine? Your condition will be monitored carefully while you are receiving this medicine. You may need blood work done while you are taking this medicine. Talk to your health care provider about your risk of cancer. You may be more at risk for certain types of cancer if you take this medicine. If you are going to need a MRI, CT scan, or other procedure, tell your doctor that you are using this medicine (On-Body Injector only). What side effects may I notice from receiving this medicine? Side effects that you should report to your doctor or health care professional as soon as possible:  allergic reactions (skin rash, itching or hives, swelling of the face, lips, or tongue)  back pain  dizziness  fever  pain, redness, or irritation at site where injected  pinpoint red spots on the skin  red or dark-brown urine  shortness of breath or  breathing problems  stomach or side pain, or pain at the shoulder  swelling  tiredness  trouble passing urine or change in the amount of urine  unusual bruising or bleeding Side effects that usually do not require medical attention (report to your doctor or health care professional if they continue or are bothersome):  bone pain  muscle pain This list may not describe all possible side effects. Call your doctor for medical advice about side effects. You may report side effects to FDA at 1-800-FDA-1088. Where should I keep my medicine? Keep out of the reach of children. If you are using this medicine at home, you will be instructed on how to store it. Throw away any  unused medicine after the expiration date on the label. NOTE: This sheet is a summary. It may not cover all possible information. If you have questions about this medicine, talk to your doctor, pharmacist, or health care provider.  2021 Elsevier/Gold Standard (2019-09-12 13:20:51)

## 2021-01-25 NOTE — Progress Notes (Signed)
HEMATOLOGY-ONCOLOGY PROGRESS NOTE   Date of Encounter: 01/14/2021  CHIEF COMPLAINT /PURPOSE OF CONSULTATION: Large B-Cell Lymphoma   HISTORY OF PRESENTING ILLNESS See previous note.    INTERVAL HISTORY  Lindsey Price is a wonderful 75 y.o. 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 The patient's last visit with [place] was on 01/18/2021. The pt reports that she is doing well overall.  The pt reports that she had some ankle swelling right after discharge following C4. The pt also notes some newfound tingling and numbness in her fingertips and toes. She continues to take the B-Complex and B12. Her stomach has not been as gaseous, but she is still very hoarse and experiencing acid reflux. She takes Nexium in the morning and Pepcid twice daily. She has been eating much better this time and has been very hungry.  Lab results today 01/26/2021 of CBC w/diff and CMP is as follows: all values are WNL except for WBC of 17.3, RBC of 3.43, Hgb of 10.3, HCT of 30.7, RDW of 18.9, Calcium of 8.6, Total protein of 5.3, Albumin of 3.1, AST of 9.  On review of systems, pt reports acid reflux, intermittent neuropathy in hands/toes, fatigue and denies decreased appetite, fevers, chills, night sweats, new lumps/bumps, changes in bowel habits, constipation, leg swelling, and any other symptoms.  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 Biopsy showed invasive and in situ ductal carcinoma, HER-2 equivocal by IHC (2+),  negative by FISH (ratio 1.59), ER+ >95%, PR+ 85%, Ki67 10%.    Malignant neoplasm of left breast in female, estrogen receptor positive ([place])  10/12/2020 Initial Diagnosis   Malignant neoplasm of left breast in female, estrogen receptor positive ([place])   10/12/2020 Cancer Staging   Staging form: Breast, AJCC 8th Edition - Clinical stage from 10/12/2020: Stage IA (cT1b, cN0, cM0, G2, ER+, PR+, HER2-) - Signed by Nicholas Lose, MD on 10/15/2020 Stage prefix: Initial diagnosis   11/05/2020 Surgery   Left lumpectomy: Grade 1 IDC, 1.1 cm, intermediate grade DCIS, margins are negative, lymph node -0/1, ER greater than 95%, PR 85%, HER-2 negative, Ki-67 10%   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[place])  11/11/2020 Initial Diagnosis   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[place])   11/15/2020 -  Chemotherapy    Patient is on Treatment Plan: IP NON-HODGKINS LYMPHOMA EPOCH Q21D   Patient is on Antibody Plan: NON-HODGKINS LYMPHOMA RITUXIMAB Q21D    11/19/2020 -  Chemotherapy    Patient is on Treatment Plan: IP NON-HODGKINS LYMPHOMA EPOCH Q21D   Patient is on Antibody Plan: NON-HODGKINS LYMPHOMA RITUXIMAB Q21D      REVIEW OF SYSTEMS:   10 Point review of Systems was done is negative except as noted above.  PHYSICAL EXAMINATION: ECOG PERFORMANCE STATUS: 1 - Symptomatic but completely ambulatory  Vitals:   01/26/21 1049  BP: 112/64  Pulse: 92  Resp: 17  Temp: 98.2 F (36.8 C)  SpO2: 98%   Filed Weights   01/26/21 1049  Weight: 128 lb (58.1 kg)  NAD. GENERAL:alert, in no acute distress and comfortable SKIN: no acute rashes, no significant lesions EYES: conjunctiva are pink and non-injected, sclera anicteric OROPHARYNX: MMM, no exudates, no oropharyngeal erythema or ulceration. Mild redness in throat. NECK: supple, no JVD LYMPH:  no palpable lymphadenopathy in the cervical, axillary or inguinal regions LUNGS: clear to auscultation b/l with normal respiratory effort HEART: regular rate & rhythm ABDOMEN:  normoactive  bowel sounds , non tender, not distended. Extremity: no pedal edema PSYCH: alert & oriented x 3 with fluent speech NEURO: no focal motor/sensory deficits   LABORATORY DATA:  I have reviewed the data as listed CMP Latest Ref Rng & Units 01/26/2021 01/21/2021 01/20/2021  Glucose 70 - 99 mg/dL 92 93 92  BUN 8 - 23 mg/dL 22 24(H) 25(H)  Creatinine 0.44 - 1.00 mg/dL 0.66 0.57 0.57  Sodium 135 - 145 mmol/L 138 144 141  Potassium 3.5 - 5.1 mmol/L 3.9 3.6 3.7  Chloride 98 - 111 mmol/L 102 113(H) 113(H)  CO2 22 - 32 mmol/L _0 Calcium 8.9 - 10.3 mg/dL 8.6(L) 8.2(L) 8.4(L)  Total Protein 6.5 - 8.1 g/dL 5.3(L) 5.1(L) 4.9(L)  Total Bilirubin 0.3 - 1.2 mg/dL 1.1 0.8 0.6  Alkaline Phos 38 - 126 U/L 88 70 69  AST 15 - 41 U/L 9(L) 28 24  ALT 0 - 44 U/L 24 45(H) 28    Lab Results  Component Value Date   WBC 17.3 (H) 01/26/2021   HGB 10.3 (L) 01/26/2021   HCT 30.7 (L) 01/26/2021   MCV 89.5 01/26/2021   PLT 205 01/26/2021   NEUTROABS 16.6 (H) 01/26/2021    No results found.  ASSESSMENT AND PLAN:  1) large B-cell lymphoma -CT abdomen/pelvis with contrast 10/21/2020 - "Acute colitis/enteritis of the cecum/ascending colon and the associated terminal loops of ileum. As was recently described on CT imaging, the anatomy of distal ileum, ileocecal valve, cecum is atypical and correlation with patient's prior surgical record would be useful. The acute inflammatory changes contribute to at least a partial small bowel obstruction. Primary concern of this inflammatory cystic mass/bowel of the right lower quadrant is malignancy given that the posterior margin is inseparable from the psoas muscle, adnexa, and the right ureter. Correlation with CEA may be useful. Inflammatory/infectious changes are also a consideration." -Colonoscopy performed 11/04/2020- "An infiltrative and ulcerated partially obstructing large mass was found at 85 cm proximal to the anus. 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 -Plan is for adjuvant radiation therapy followed by adjuvant antiestrogen therapy  3)  iron deficiency anemia -Labs from 11/15/2020-ferritin 34, iron 24, percent saturation 8%, TIBC 308 -Labs from 11/16/2020-vitamin B12 level 241, folate 7.4  4) history of right breast cancer diagnosed in 2011 status post lumpectomy and 5 years of antiestrogen therapy  5) depression/anxiety  6) hyperlipidemia  7) hypertension  8) hypothyroidism  9) allergies  PLAN:  1)large B-cell lymphoma -CT abdomen/pelvis with contrast 10/21/2020- "Acute colitis/enteritis of the cecum/ascending colon and the associated terminal loops of ileum. As was recently described on CT imaging, the anatomy of distal ileum, ileocecal valve, cecum is atypical and correlation with patient's prior surgical record would be useful. The acute inflammatory changes contribute to at least a partial small bowel obstruction. Primary concern of this inflammatory cystic mass/bowel of the right lower quadrant is malignancy given that the posterior margin is inseparable from the psoas muscle, adnexa, and  the right ureter. Correlation with CEA may be useful. Inflammatory/infectious changes are also a consideration." -Colonoscopy performed 11/04/2020-"An infiltrative and ulcerated partially obstructing large mass was found at 85 cm proximal to the anus. 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 -Plan is for adjuvant radiation therapy followed by adjuvant antiestrogen therapy after completion of treatment for diffuse large B cell lymphoma  3) iron deficiency anemia -Labs from 11/15/2020-ferritin 34, iron 24, percent saturation 8%, TIBC 308 -Labs from 11/16/2020-vitamin B12 level 241, folate 7.4  4)history of right breast cancer  diagnosed in 2011 status post lumpectomy and 5 years of antiestrogen therapy  5)depression/anxiety  6)hyperlipidemia  7)hypertension  8)hypothyroidism  9)allergies  10) Iron deficiency  11) B12 deficiency  12) GERD   PLAN:  -Discussed pt labwork today, 01/26/2021; blood counts stable, chemistries stable. No need for transfusion at this time. -Discussed addition of Gabapentin if neuropathy becomes painful. Will add this for use at bedtime. -Will get repeat PET following completion of C6. -Recommended pt stop Pepcid and increase Nexium to BID thirty minutes prior to meals. -Recommended pt avoid drinking during meals and walk 1000 steps following meals. Smaller, more frequent meals. -Continue to drink at least 2-3 Ensures and supplemental drinks daily in addition to meals. -Advised pt to take the Remeron consistently every night to help with increasing appetite. -Recommended smaller, more frequent meals and adding nuts or calorically dense foods to diet to increase caloric intake. -Recommended pt walk 20-30 minutes daily, eat healthy, sleep well, and drink 48-64 oz water daily. -Continue Vitamin B-Complex. Continue B12 shots. -Will connect with our PA Murray Hodgkins and Dr. Lorenso Courier while Dr. Irene Limbo is in [place] for three weeks in June. -Will set up labs for next Thursday for any transfusion needs.    FOLLOW UP: Portflush and labs in 1 weeks Inpatient [facility] for 5 days for C5 from 02/07/2021 Outpatient RItuxan and Udenyca on 02/14/2021 Outpatient portflush and labs and toxicity check with Murray Hodgkins and 1 unit of PRBC on 02/18/2021. Outpatient portflush and labs and toxicity check with Murray Hodgkins and 1 unit of PRBC on 02/21/2021.      All of the patients questions were answered with apparent satisfaction. The patient knows to call the clinic with any problems, questions or concerns.    The total time spent in the appointment was 20 minutes and more than 50% was on counseling and direct  patient cares.    Sullivan Lone MD [place] AAHIVMS [facility] Hematology/Oncology Physician [facility]  (Office):       [phone] (Work cell):  [phone] (Fax):           [phone]   I, Reinaldo Raddle, am acting as scribe for Dr. Sullivan Lone, MD.  .I have reviewed the above documentation for accuracy and completeness, and I agree with the above. Brunetta Genera MD

## 2021-01-26 ENCOUNTER — Other Ambulatory Visit: Payer: Self-pay

## 2021-01-26 ENCOUNTER — Inpatient Hospital Stay: Payer: PPO | Admitting: Hematology

## 2021-01-26 ENCOUNTER — Inpatient Hospital Stay: Payer: PPO

## 2021-01-26 VITALS — BP 112/64 | HR 92 | Temp 98.2°F | Resp 17 | Wt 128.0 lb

## 2021-01-26 DIAGNOSIS — Z170 Estrogen receptor positive status [ER+]: Secondary | ICD-10-CM

## 2021-01-26 DIAGNOSIS — C8510 Unspecified B-cell lymphoma, unspecified site: Secondary | ICD-10-CM | POA: Diagnosis not present

## 2021-01-26 DIAGNOSIS — Z5112 Encounter for antineoplastic immunotherapy: Secondary | ICD-10-CM | POA: Diagnosis not present

## 2021-01-26 DIAGNOSIS — Z95828 Presence of other vascular implants and grafts: Secondary | ICD-10-CM

## 2021-01-26 LAB — CMP (CANCER CENTER ONLY)
ALT: 24 U/L (ref 0–44)
AST: 9 U/L — ABNORMAL LOW (ref 15–41)
Albumin: 3.1 g/dL — ABNORMAL LOW (ref 3.5–5.0)
Alkaline Phosphatase: 88 U/L (ref 38–126)
Anion gap: 9 (ref 5–15)
BUN: 22 mg/dL (ref 8–23)
CO2: 27 mmol/L (ref 22–32)
Calcium: 8.6 mg/dL — ABNORMAL LOW (ref 8.9–10.3)
Chloride: 102 mmol/L (ref 98–111)
Creatinine: 0.66 mg/dL (ref 0.44–1.00)
GFR, Estimated: >60 mL/min (ref >60)
Glucose, Bld: 92 mg/dL (ref 70–99)
Potassium: 3.9 mmol/L (ref 3.5–5.1)
Sodium: 138 mmol/L (ref 135–145)
Total Bilirubin: 1.1 mg/dL (ref 0.3–1.2)
Total Protein: 5.3 g/dL — ABNORMAL LOW (ref 6.5–8.1)

## 2021-01-26 LAB — TYPE AND SCREEN
ABO/RH(D): O POS
Antibody Screen: NEGATIVE

## 2021-01-26 LAB — CBC WITH DIFFERENTIAL (CANCER CENTER ONLY)
Abs Immature Granulocytes: 0 10*3/uL (ref 0.00–0.07)
Band Neutrophils: 8 %
Basophils Absolute: 0 10*3/uL (ref 0.0–0.1)
Basophils Relative: 0 %
Eosinophils Absolute: 0 10*3/uL (ref 0.0–0.5)
Eosinophils Relative: 0 %
HCT: 30.7 % — ABNORMAL LOW (ref 36.0–46.0)
Hemoglobin: 10.3 g/dL — ABNORMAL LOW (ref 12.0–15.0)
Lymphocytes Relative: 3 %
Lymphs Abs: 0.5 10*3/uL — ABNORMAL LOW (ref 0.7–4.0)
MCH: 30 pg (ref 26.0–34.0)
MCHC: 33.6 g/dL (ref 30.0–36.0)
MCV: 89.5 fL (ref 80.0–100.0)
Monocytes Absolute: 0.2 10*3/uL (ref 0.1–1.0)
Monocytes Relative: 1 %
Neutro Abs: 16.6 10*3/uL — ABNORMAL HIGH (ref 1.7–7.7)
Neutrophils Relative %: 88 %
Platelet Count: 205 10*3/uL (ref 150–400)
RBC: 3.43 MIL/uL — ABNORMAL LOW (ref 3.87–5.11)
RDW: 18.9 % — ABNORMAL HIGH (ref 11.5–15.5)
WBC Count: 17.3 10*3/uL — ABNORMAL HIGH (ref 4.0–10.5)
nRBC: 0 % (ref 0.0–0.2)

## 2021-01-26 MED ORDER — HEPARIN SOD (PORK) LOCK FLUSH 100 UNIT/ML IV SOLN
500.0000 [IU] | Freq: Once | INTRAVENOUS | Status: AC | PRN
  Administered 2021-01-26: 500 [IU]
  Filled 2021-01-26: qty 5

## 2021-01-26 MED ORDER — SODIUM CHLORIDE 0.9% FLUSH
10.0000 mL | INTRAVENOUS | Status: DC | PRN
  Administered 2021-01-26: 10 mL
  Filled 2021-01-26: qty 10

## 2021-01-27 ENCOUNTER — Telehealth: Payer: Self-pay | Admitting: Hematology

## 2021-01-27 ENCOUNTER — Telehealth: Payer: Self-pay

## 2021-01-27 ENCOUNTER — Encounter: Payer: Self-pay | Admitting: *Deleted

## 2021-01-27 NOTE — Telephone Encounter (Signed)
Contacted pt to let her know no need for blood transfusion this week. Pt acknowledged.

## 2021-01-27 NOTE — Telephone Encounter (Signed)
Scheduled follow-up appointments per 5/25 los. Patient is aware.

## 2021-02-01 ENCOUNTER — Other Ambulatory Visit: Payer: Self-pay

## 2021-02-01 ENCOUNTER — Encounter: Payer: Self-pay | Admitting: Hematology and Oncology

## 2021-02-01 ENCOUNTER — Encounter: Payer: Self-pay | Admitting: Hematology

## 2021-02-01 DIAGNOSIS — C8510 Unspecified B-cell lymphoma, unspecified site: Secondary | ICD-10-CM

## 2021-02-02 ENCOUNTER — Other Ambulatory Visit: Payer: Self-pay | Admitting: Hematology

## 2021-02-02 ENCOUNTER — Other Ambulatory Visit: Payer: Self-pay

## 2021-02-02 ENCOUNTER — Inpatient Hospital Stay: Payer: PPO

## 2021-02-02 ENCOUNTER — Inpatient Hospital Stay: Payer: PPO | Attending: Physician Assistant

## 2021-02-02 DIAGNOSIS — Z95828 Presence of other vascular implants and grafts: Secondary | ICD-10-CM

## 2021-02-02 DIAGNOSIS — Z87891 Personal history of nicotine dependence: Secondary | ICD-10-CM | POA: Insufficient documentation

## 2021-02-02 DIAGNOSIS — D0512 Intraductal carcinoma in situ of left breast: Secondary | ICD-10-CM | POA: Diagnosis not present

## 2021-02-02 DIAGNOSIS — B370 Candidal stomatitis: Secondary | ICD-10-CM | POA: Insufficient documentation

## 2021-02-02 DIAGNOSIS — Z853 Personal history of malignant neoplasm of breast: Secondary | ICD-10-CM | POA: Diagnosis not present

## 2021-02-02 DIAGNOSIS — D509 Iron deficiency anemia, unspecified: Secondary | ICD-10-CM | POA: Insufficient documentation

## 2021-02-02 DIAGNOSIS — C8519 Unspecified B-cell lymphoma, extranodal and solid organ sites: Secondary | ICD-10-CM | POA: Insufficient documentation

## 2021-02-02 DIAGNOSIS — C8510 Unspecified B-cell lymphoma, unspecified site: Secondary | ICD-10-CM

## 2021-02-02 DIAGNOSIS — Z5189 Encounter for other specified aftercare: Secondary | ICD-10-CM | POA: Insufficient documentation

## 2021-02-02 DIAGNOSIS — C50412 Malignant neoplasm of upper-outer quadrant of left female breast: Secondary | ICD-10-CM

## 2021-02-02 DIAGNOSIS — Z170 Estrogen receptor positive status [ER+]: Secondary | ICD-10-CM

## 2021-02-02 LAB — CBC WITH DIFFERENTIAL (CANCER CENTER ONLY)
Abs Immature Granulocytes: 4.67 10*3/uL — ABNORMAL HIGH (ref 0.00–0.07)
Basophils Absolute: 0.1 10*3/uL (ref 0.0–0.1)
Basophils Relative: 1 %
Eosinophils Absolute: 0 10*3/uL (ref 0.0–0.5)
Eosinophils Relative: 0 %
HCT: 30.7 % — ABNORMAL LOW (ref 36.0–46.0)
Hemoglobin: 9.9 g/dL — ABNORMAL LOW (ref 12.0–15.0)
Immature Granulocytes: 30 %
Lymphocytes Relative: 4 %
Lymphs Abs: 0.6 10*3/uL — ABNORMAL LOW (ref 0.7–4.0)
MCH: 29 pg (ref 26.0–34.0)
MCHC: 32.2 g/dL (ref 30.0–36.0)
MCV: 90 fL (ref 80.0–100.0)
Monocytes Absolute: 1.4 10*3/uL — ABNORMAL HIGH (ref 0.1–1.0)
Monocytes Relative: 9 %
Neutro Abs: 8.7 10*3/uL — ABNORMAL HIGH (ref 1.7–7.7)
Neutrophils Relative %: 56 %
Platelet Count: 248 10*3/uL (ref 150–400)
RBC: 3.41 MIL/uL — ABNORMAL LOW (ref 3.87–5.11)
RDW: 19 % — ABNORMAL HIGH (ref 11.5–15.5)
WBC Count: 15.4 10*3/uL — ABNORMAL HIGH (ref 4.0–10.5)
nRBC: 0.2 % (ref 0.0–0.2)

## 2021-02-02 LAB — CMP (CANCER CENTER ONLY)
ALT: 26 U/L (ref 0–44)
AST: 24 U/L (ref 15–41)
Albumin: 2.8 g/dL — ABNORMAL LOW (ref 3.5–5.0)
Alkaline Phosphatase: 92 U/L (ref 38–126)
Anion gap: 13 (ref 5–15)
BUN: 12 mg/dL (ref 8–23)
CO2: 24 mmol/L (ref 22–32)
Calcium: 8.6 mg/dL — ABNORMAL LOW (ref 8.9–10.3)
Chloride: 103 mmol/L (ref 98–111)
Creatinine: 0.68 mg/dL (ref 0.44–1.00)
GFR, Estimated: >60 mL/min (ref >60)
Glucose, Bld: 87 mg/dL (ref 70–99)
Potassium: 3 mmol/L — ABNORMAL LOW (ref 3.5–5.1)
Sodium: 140 mmol/L (ref 135–145)
Total Bilirubin: 0.6 mg/dL (ref 0.3–1.2)
Total Protein: 5.9 g/dL — ABNORMAL LOW (ref 6.5–8.1)

## 2021-02-02 MED ORDER — SODIUM CHLORIDE 0.9% FLUSH
10.0000 mL | INTRAVENOUS | Status: DC | PRN
  Administered 2021-02-02: 10 mL
  Filled 2021-02-02: qty 10

## 2021-02-02 MED ORDER — HEPARIN SOD (PORK) LOCK FLUSH 100 UNIT/ML IV SOLN
500.0000 [IU] | Freq: Once | INTRAVENOUS | Status: AC | PRN
  Administered 2021-02-02: 500 [IU]
  Filled 2021-02-02: qty 5

## 2021-02-04 ENCOUNTER — Other Ambulatory Visit (HOSPITAL_COMMUNITY)
Admission: RE | Admit: 2021-02-04 | Discharge: 2021-02-04 | Disposition: A | Discharge status: Home / Self Care | Payer: PPO | Source: Ambulatory Visit | Attending: Hematology | Admitting: Hematology

## 2021-02-04 DIAGNOSIS — Z20822 Contact with and (suspected) exposure to covid-19: Secondary | ICD-10-CM | POA: Insufficient documentation

## 2021-02-04 DIAGNOSIS — Z01812 Encounter for preprocedural laboratory examination: Secondary | ICD-10-CM | POA: Insufficient documentation

## 2021-02-04 LAB — SARS CORONAVIRUS 2 (TAT 6-24 HRS): SARS Coronavirus 2: NEGATIVE

## 2021-02-07 ENCOUNTER — Encounter (HOSPITAL_COMMUNITY): Payer: Self-pay | Admitting: Hematology

## 2021-02-07 ENCOUNTER — Other Ambulatory Visit: Payer: Self-pay

## 2021-02-07 ENCOUNTER — Inpatient Hospital Stay (HOSPITAL_COMMUNITY)
Admission: RE | Admit: 2021-02-07 | Discharge: 2021-02-15 | DRG: 847 | Disposition: A | Discharge status: Home / Self Care | Payer: PPO | Source: Ambulatory Visit | Attending: Hematology and Oncology | Admitting: Hematology and Oncology

## 2021-02-07 DIAGNOSIS — K56600 Partial intestinal obstruction, unspecified as to cause: Secondary | ICD-10-CM | POA: Diagnosis not present

## 2021-02-07 DIAGNOSIS — E039 Hypothyroidism, unspecified: Secondary | ICD-10-CM | POA: Diagnosis not present

## 2021-02-07 DIAGNOSIS — C8519 Unspecified B-cell lymphoma, extranodal and solid organ sites: Secondary | ICD-10-CM | POA: Diagnosis not present

## 2021-02-07 DIAGNOSIS — G4700 Insomnia, unspecified: Secondary | ICD-10-CM | POA: Diagnosis not present

## 2021-02-07 DIAGNOSIS — D649 Anemia, unspecified: Secondary | ICD-10-CM

## 2021-02-07 DIAGNOSIS — D6481 Anemia due to antineoplastic chemotherapy: Secondary | ICD-10-CM | POA: Diagnosis present

## 2021-02-07 DIAGNOSIS — G911 Obstructive hydrocephalus: Secondary | ICD-10-CM | POA: Diagnosis not present

## 2021-02-07 DIAGNOSIS — C8330 Diffuse large B-cell lymphoma, unspecified site: Secondary | ICD-10-CM | POA: Diagnosis not present

## 2021-02-07 DIAGNOSIS — J3089 Other allergic rhinitis: Secondary | ICD-10-CM

## 2021-02-07 DIAGNOSIS — I700 Atherosclerosis of aorta: Secondary | ICD-10-CM | POA: Diagnosis not present

## 2021-02-07 DIAGNOSIS — I10 Essential (primary) hypertension: Secondary | ICD-10-CM | POA: Diagnosis not present

## 2021-02-07 DIAGNOSIS — J45909 Unspecified asthma, uncomplicated: Secondary | ICD-10-CM | POA: Diagnosis not present

## 2021-02-07 DIAGNOSIS — F419 Anxiety disorder, unspecified: Secondary | ICD-10-CM | POA: Diagnosis present

## 2021-02-07 DIAGNOSIS — Z7189 Other specified counseling: Secondary | ICD-10-CM | POA: Diagnosis not present

## 2021-02-07 DIAGNOSIS — Z5111 Encounter for antineoplastic chemotherapy: Secondary | ICD-10-CM | POA: Diagnosis not present

## 2021-02-07 DIAGNOSIS — T451X5A Adverse effect of antineoplastic and immunosuppressive drugs, initial encounter: Secondary | ICD-10-CM | POA: Diagnosis not present

## 2021-02-07 DIAGNOSIS — Z888 Allergy status to other drugs, medicaments and biological substances status: Secondary | ICD-10-CM

## 2021-02-07 DIAGNOSIS — Z79899 Other long term (current) drug therapy: Secondary | ICD-10-CM | POA: Diagnosis not present

## 2021-02-07 DIAGNOSIS — Z5112 Encounter for antineoplastic immunotherapy: Secondary | ICD-10-CM

## 2021-02-07 DIAGNOSIS — Z87891 Personal history of nicotine dependence: Secondary | ICD-10-CM

## 2021-02-07 DIAGNOSIS — E785 Hyperlipidemia, unspecified: Secondary | ICD-10-CM | POA: Diagnosis present

## 2021-02-07 DIAGNOSIS — K56609 Unspecified intestinal obstruction, unspecified as to partial versus complete obstruction: Secondary | ICD-10-CM

## 2021-02-07 DIAGNOSIS — E559 Vitamin D deficiency, unspecified: Secondary | ICD-10-CM

## 2021-02-07 DIAGNOSIS — D509 Iron deficiency anemia, unspecified: Secondary | ICD-10-CM | POA: Diagnosis present

## 2021-02-07 DIAGNOSIS — C8510 Unspecified B-cell lymphoma, unspecified site: Secondary | ICD-10-CM | POA: Diagnosis present

## 2021-02-07 DIAGNOSIS — E782 Mixed hyperlipidemia: Secondary | ICD-10-CM

## 2021-02-07 DIAGNOSIS — Z853 Personal history of malignant neoplasm of breast: Secondary | ICD-10-CM

## 2021-02-07 DIAGNOSIS — Z20822 Contact with and (suspected) exposure to covid-19: Secondary | ICD-10-CM | POA: Diagnosis present

## 2021-02-07 DIAGNOSIS — Z8572 Personal history of non-Hodgkin lymphomas: Secondary | ICD-10-CM | POA: Diagnosis not present

## 2021-02-07 DIAGNOSIS — J309 Allergic rhinitis, unspecified: Secondary | ICD-10-CM

## 2021-02-07 DIAGNOSIS — F32A Depression, unspecified: Secondary | ICD-10-CM | POA: Diagnosis not present

## 2021-02-07 DIAGNOSIS — K219 Gastro-esophageal reflux disease without esophagitis: Secondary | ICD-10-CM | POA: Diagnosis not present

## 2021-02-07 DIAGNOSIS — E876 Hypokalemia: Secondary | ICD-10-CM

## 2021-02-07 DIAGNOSIS — E786 Lipoprotein deficiency: Secondary | ICD-10-CM

## 2021-02-07 LAB — CBC WITH DIFFERENTIAL/PLATELET
Abs Immature Granulocytes: 0.48 10*3/uL — ABNORMAL HIGH (ref 0.00–0.07)
Basophils Absolute: 0.1 10*3/uL (ref 0.0–0.1)
Basophils Relative: 1 %
Eosinophils Absolute: 0 10*3/uL (ref 0.0–0.5)
Eosinophils Relative: 1 %
HCT: 29.7 % — ABNORMAL LOW (ref 36.0–46.0)
Hemoglobin: 9.6 g/dL — ABNORMAL LOW (ref 12.0–15.0)
Immature Granulocytes: 7 %
Lymphocytes Relative: 9 %
Lymphs Abs: 0.7 10*3/uL (ref 0.7–4.0)
MCH: 29.8 pg (ref 26.0–34.0)
MCHC: 32.3 g/dL (ref 30.0–36.0)
MCV: 92.2 fL (ref 80.0–100.0)
Monocytes Absolute: 0.9 10*3/uL (ref 0.1–1.0)
Monocytes Relative: 12 %
Neutro Abs: 5.3 10*3/uL (ref 1.7–7.7)
Neutrophils Relative %: 70 %
Platelets: 415 10*3/uL — ABNORMAL HIGH (ref 150–400)
RBC: 3.22 MIL/uL — ABNORMAL LOW (ref 3.87–5.11)
RDW: 18.5 % — ABNORMAL HIGH (ref 11.5–15.5)
WBC: 7.4 10*3/uL (ref 4.0–10.5)
nRBC: 0 % (ref 0.0–0.2)

## 2021-02-07 LAB — COMPREHENSIVE METABOLIC PANEL
ALT: 32 U/L (ref 0–44)
AST: 22 U/L (ref 15–41)
Albumin: 3.1 g/dL — ABNORMAL LOW (ref 3.5–5.0)
Alkaline Phosphatase: 74 U/L (ref 38–126)
Anion gap: 10 (ref 5–15)
BUN: 17 mg/dL (ref 8–23)
CO2: 22 mmol/L (ref 22–32)
Calcium: 8.7 mg/dL — ABNORMAL LOW (ref 8.9–10.3)
Chloride: 105 mmol/L (ref 98–111)
Creatinine, Ser: 0.58 mg/dL (ref 0.44–1.00)
GFR, Estimated: >60 mL/min (ref >60)
Glucose, Bld: 101 mg/dL — ABNORMAL HIGH (ref 70–99)
Potassium: 2.9 mmol/L — ABNORMAL LOW (ref 3.5–5.1)
Sodium: 137 mmol/L (ref 135–145)
Total Bilirubin: 0.8 mg/dL (ref 0.3–1.2)
Total Protein: 6.1 g/dL — ABNORMAL LOW (ref 6.5–8.1)

## 2021-02-07 MED ORDER — ALBUTEROL SULFATE (2.5 MG/3ML) 0.083% IN NEBU
2.5000 mg | INHALATION_SOLUTION | Freq: Four times a day (QID) | RESPIRATORY_TRACT | Status: DC | PRN
  Filled 2021-02-07: qty 3

## 2021-02-07 MED ORDER — SODIUM CHLORIDE 0.9 % IV SOLN
INTRAVENOUS | Status: DC
  Administered 2021-02-09: 1000 mL via INTRAVENOUS

## 2021-02-07 MED ORDER — LEVOCETIRIZINE DIHYDROCHLORIDE 5 MG PO TABS: 5.0000 mg | ORAL_TABLET | Freq: Every day | ORAL | Status: DC

## 2021-02-07 MED ORDER — MONTELUKAST SODIUM 10 MG PO TABS
10.0000 mg | ORAL_TABLET | Freq: Every day | ORAL | Status: DC
  Administered 2021-02-08 – 2021-02-15 (×8): 10 mg via ORAL
  Filled 2021-02-07 (×8): qty 1

## 2021-02-07 MED ORDER — LORAZEPAM 0.5 MG PO TABS
0.5000 mg | ORAL_TABLET | Freq: Four times a day (QID) | ORAL | Status: DC | PRN
  Administered 2021-02-07 – 2021-02-10 (×4): 0.5 mg via ORAL
  Filled 2021-02-07 (×4): qty 1

## 2021-02-07 MED ORDER — HYDROCODONE-ACETAMINOPHEN 5-325 MG PO TABS
1.0000 | ORAL_TABLET | Freq: Four times a day (QID) | ORAL | Status: DC | PRN
  Administered 2021-02-09 – 2021-02-11 (×2): 1 via ORAL
  Filled 2021-02-07 (×2): qty 1

## 2021-02-07 MED ORDER — CALCIUM CARBONATE 1250 (500 CA) MG PO TABS
1250.0000 mg | ORAL_TABLET | Freq: Every day | ORAL | Status: DC
  Administered 2021-02-08 – 2021-02-15 (×8): 1250 mg via ORAL
  Filled 2021-02-07 (×9): qty 1

## 2021-02-07 MED ORDER — ENOXAPARIN SODIUM 40 MG/0.4ML IJ SOSY
40.0000 mg | PREFILLED_SYRINGE | INTRAMUSCULAR | Status: DC
  Administered 2021-02-07 – 2021-02-14 (×8): 40 mg via SUBCUTANEOUS
  Filled 2021-02-07 (×8): qty 0.4

## 2021-02-07 MED ORDER — FAMOTIDINE 20 MG PO TABS: 20.0000 mg | ORAL_TABLET | Freq: Two times a day (BID) | ORAL | Status: DC | PRN

## 2021-02-07 MED ORDER — SODIUM CHLORIDE 0.9% FLUSH
10.0000 mL | Freq: Two times a day (BID) | INTRAVENOUS | Status: DC
  Administered 2021-02-07: 20 mL
  Administered 2021-02-07 – 2021-02-08 (×2): 10 mL
  Administered 2021-02-08: 20 mL
  Administered 2021-02-13 – 2021-02-15 (×3): 10 mL

## 2021-02-07 MED ORDER — SENNOSIDES-DOCUSATE SODIUM 8.6-50 MG PO TABS
1.0000 | ORAL_TABLET | Freq: Two times a day (BID) | ORAL | Status: DC
  Administered 2021-02-07 – 2021-02-15 (×11): 1 via ORAL
  Filled 2021-02-07 (×15): qty 1

## 2021-02-07 MED ORDER — CHLORHEXIDINE GLUCONATE CLOTH 2 % EX PADS
6.0000 | MEDICATED_PAD | Freq: Every day | CUTANEOUS | Status: DC
Start: 2021-02-07 — End: 2021-02-15
  Administered 2021-02-07 – 2021-02-15 (×9): 6 via TOPICAL

## 2021-02-07 MED ORDER — SODIUM CHLORIDE 0.9% FLUSH
10.0000 mL | INTRAVENOUS | Status: DC | PRN
  Administered 2021-02-11: 10 mL

## 2021-02-07 MED ORDER — HOT PACK MISC ONCOLOGY
1.0000 | Freq: Once | Status: DC | PRN
  Filled 2021-02-07: qty 1

## 2021-02-07 MED ORDER — VITAMIN D (ERGOCALCIFEROL) 1.25 MG (50000 UNIT) PO CAPS
50000.0000 [IU] | ORAL_CAPSULE | ORAL | Status: DC
  Administered 2021-02-11: 50000 [IU] via ORAL
  Filled 2021-02-07: qty 1

## 2021-02-07 MED ORDER — POLYETHYLENE GLYCOL 3350 17 G PO PACK
17.0000 g | PACK | Freq: Every day | ORAL | Status: DC
  Administered 2021-02-07 – 2021-02-08 (×2): 17 g via ORAL
  Filled 2021-02-07 (×5): qty 1

## 2021-02-07 MED ORDER — LIDOCAINE-PRILOCAINE 2.5-2.5 % EX CREA: 1.0000 "application " | TOPICAL_CREAM | CUTANEOUS | Status: DC | PRN

## 2021-02-07 MED ORDER — LEVOTHYROXINE SODIUM 88 MCG PO TABS: 88.0000 ug | ORAL_TABLET | ORAL | Status: DC

## 2021-02-07 MED ORDER — ADULT MULTIVITAMIN W/MINERALS CH
1.0000 | ORAL_TABLET | Freq: Every day | ORAL | Status: DC
  Administered 2021-02-08 – 2021-02-15 (×8): 1 via ORAL
  Filled 2021-02-07 (×8): qty 1

## 2021-02-07 MED ORDER — MAGIC MOUTHWASH W/LIDOCAINE
5.0000 mL | Freq: Four times a day (QID) | ORAL | Status: DC | PRN
  Filled 2021-02-07: qty 5

## 2021-02-07 MED ORDER — LEVOTHYROXINE SODIUM 100 MCG PO TABS: 100.0000 ug | ORAL_TABLET | ORAL | Status: DC

## 2021-02-07 MED ORDER — FLUTICASONE PROPIONATE 50 MCG/ACT NA SUSP
1.0000 | Freq: Every day | NASAL | Status: DC
  Administered 2021-02-08 – 2021-02-14 (×7): 1 via NASAL
  Filled 2021-02-07: qty 16

## 2021-02-07 MED ORDER — PREDNISONE 20 MG PO TABS
60.0000 mg | ORAL_TABLET | Freq: Every day | ORAL | Status: AC
  Administered 2021-02-07 – 2021-02-11 (×5): 60 mg via ORAL
  Filled 2021-02-07 (×6): qty 3

## 2021-02-07 MED ORDER — COLD PACK MISC ONCOLOGY
1.0000 | Freq: Once | Status: DC | PRN
  Filled 2021-02-07: qty 1

## 2021-02-07 MED ORDER — LORATADINE 10 MG PO TABS
10.0000 mg | ORAL_TABLET | Freq: Every day | ORAL | Status: DC
  Administered 2021-02-08 – 2021-02-15 (×8): 10 mg via ORAL
  Filled 2021-02-07 (×8): qty 1

## 2021-02-07 MED ORDER — ENSURE ENLIVE PO LIQD
237.0000 mL | Freq: Two times a day (BID) | ORAL | Status: DC
  Administered 2021-02-07 – 2021-02-14 (×11): 237 mL via ORAL

## 2021-02-07 MED ORDER — VENLAFAXINE HCL ER 37.5 MG PO CP24
37.5000 mg | ORAL_CAPSULE | Freq: Every day | ORAL | Status: DC
  Administered 2021-02-08 – 2021-02-15 (×8): 37.5 mg via ORAL
  Filled 2021-02-07 (×8): qty 1

## 2021-02-07 MED ORDER — ATORVASTATIN CALCIUM 10 MG PO TABS
10.0000 mg | ORAL_TABLET | Freq: Every day | ORAL | Status: DC
  Administered 2021-02-07 – 2021-02-14 (×8): 10 mg via ORAL
  Filled 2021-02-07 (×8): qty 1

## 2021-02-07 MED ORDER — ALBUTEROL SULFATE HFA 108 (90 BASE) MCG/ACT IN AERS
2.0000 | INHALATION_SPRAY | RESPIRATORY_TRACT | Status: DC | PRN
  Filled 2021-02-07: qty 6.7

## 2021-02-07 MED ORDER — ALUM & MAG HYDROXIDE-SIMETH 200-200-20 MG/5ML PO SUSP
30.0000 mL | ORAL | Status: DC | PRN
  Administered 2021-02-09 – 2021-02-11 (×4): 30 mL via ORAL
  Filled 2021-02-07 (×5): qty 30

## 2021-02-07 MED ORDER — PROCHLORPERAZINE 25 MG RE SUPP
25.0000 mg | Freq: Two times a day (BID) | RECTAL | Status: DC | PRN
  Filled 2021-02-07: qty 1

## 2021-02-07 MED ORDER — LEVOTHYROXINE SODIUM 88 MCG PO TABS
88.0000 ug | ORAL_TABLET | ORAL | Status: DC
  Administered 2021-02-09 – 2021-02-15 (×4): 88 ug via ORAL
  Filled 2021-02-07 (×7): qty 1

## 2021-02-07 MED ORDER — PANTOPRAZOLE SODIUM 40 MG PO TBEC
40.0000 mg | DELAYED_RELEASE_TABLET | Freq: Two times a day (BID) | ORAL | Status: DC
Start: 2021-02-07 — End: 2021-02-15
  Administered 2021-02-07 – 2021-02-15 (×16): 40 mg via ORAL
  Filled 2021-02-07 (×16): qty 1

## 2021-02-07 MED ORDER — POTASSIUM CHLORIDE CRYS ER 20 MEQ PO TBCR
40.0000 meq | EXTENDED_RELEASE_TABLET | Freq: Once | ORAL | Status: AC
  Administered 2021-02-07: 40 meq via ORAL
  Filled 2021-02-07: qty 2

## 2021-02-07 MED ORDER — MIRTAZAPINE 15 MG PO TABS
15.0000 mg | ORAL_TABLET | Freq: Every day | ORAL | Status: DC
  Administered 2021-02-07 – 2021-02-14 (×8): 15 mg via ORAL
  Filled 2021-02-07 (×8): qty 1

## 2021-02-07 MED ORDER — PANTOPRAZOLE SODIUM 40 MG PO TBEC: 40.0000 mg | DELAYED_RELEASE_TABLET | Freq: Every day | ORAL | Status: DC

## 2021-02-07 MED ORDER — CALCIUM CARBONATE 1250 (500 CA) MG PO TABS: 1250.0000 mg | ORAL_TABLET | Freq: Every day | ORAL | Status: DC

## 2021-02-07 MED ORDER — PROCHLORPERAZINE MALEATE 10 MG PO TABS
10.0000 mg | ORAL_TABLET | Freq: Four times a day (QID) | ORAL | Status: DC | PRN
Start: 2021-02-07 — End: 2021-02-15
  Administered 2021-02-09 – 2021-02-11 (×5): 10 mg via ORAL
  Filled 2021-02-07 (×5): qty 1

## 2021-02-07 MED ORDER — BUDESONIDE 0.25 MG/2ML IN SUSP
0.2500 mg | Freq: Two times a day (BID) | RESPIRATORY_TRACT | Status: DC
Start: 2021-02-07 — End: 2021-02-08
  Filled 2021-02-07: qty 2

## 2021-02-07 MED ORDER — SODIUM BICARBONATE/SODIUM CHLORIDE MOUTHWASH
1.0000 "application " | Freq: Four times a day (QID) | OROMUCOSAL | Status: DC
  Administered 2021-02-07 – 2021-02-14 (×28): 1 via OROMUCOSAL
  Filled 2021-02-07: qty 1000

## 2021-02-07 MED ORDER — LEVOTHYROXINE SODIUM 100 MCG PO TABS
100.0000 ug | ORAL_TABLET | ORAL | Status: DC
  Administered 2021-02-08 – 2021-02-14 (×4): 100 ug via ORAL
  Filled 2021-02-07 (×5): qty 1

## 2021-02-07 MED ORDER — ETOPOSIDE CHEMO INJECTION 500 MG/25ML
Freq: Once | INTRAVENOUS | Status: AC
  Filled 2021-02-07: qty 8

## 2021-02-07 MED ORDER — SODIUM CHLORIDE 0.9 % IV SOLN
Freq: Once | INTRAVENOUS | Status: AC
  Administered 2021-02-07: 8 mg via INTRAVENOUS
  Filled 2021-02-07: qty 4

## 2021-02-07 MED ORDER — EZETIMIBE 10 MG PO TABS
10.0000 mg | ORAL_TABLET | Freq: Every day | ORAL | Status: DC
  Administered 2021-02-07 – 2021-02-14 (×8): 10 mg via ORAL
  Filled 2021-02-07 (×8): qty 1

## 2021-02-07 NOTE — H&P (Signed)
[place]  [phone] [phone]   MEDICAL ONCOLOGY - ADMISSION H&P  Reason for Admission: Cycle #5 EPOCH-R, Large B-Cell Lymphoma of the colon  HPI: 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  The patient has been being followed by Dr. Lindi Adie in our office for her breast cancer.  The patient had a CT of the abdomen/pelvis with contrast due to abdominal pain performed on 10/21/2020 which showed acute colitis/enteritis of the cecum/ascending colon and the associated terminal loops of ileum, acute inflammatory changes contribute to at least a partial small bowel obstruction, primary concern of this inflammatory cystic mass/bowel of the right lower quadrant is malignancy given that the posterior margin is inseparable from the psoas muscle, adnexa, and right ureter.  A colonoscopy was performed on 11/04/2020 which showed an infiltrative and ulcerated partially obstructing large mass was found at 85 cm proximal to the anus. The mass was circumferential.  This mass was biopsied and was consistent with large B-cell lymphoma.  Due to this new finding, the patient was referred to Dr. Irene Limbo for consideration of systemic chemotherapy for treatment of her B-cell lymphoma.  The patient is seen today prior to cycle #5 of her chemotherapy.  She reported that last week she was having nausea, vomiting, diarrhea.  She was not eating well.  She took as needed antiemetics and antidiarrheals with resolution of her symptoms.  She has been able to eat and drink for the  past 48 hours.  She also reports that she has been having increased hoarseness and was started on a Flovent inhaler in addition to her as needed albuterol for possible asthma symptoms.  Her Nexium was also increased.  Today, she denies fevers, chills, mucositis, chest pain, shortness of breath, abdominal pain, nausea, vomiting, constipation, diarrhea. The patient is seen today for admission prior to cycle #5 of EPOCH-R.      Past Medical History:  Diagnosis Date  . Allergy   . Anemia   . Anxiety   . Arthralgia 09/11/2011  . Asthma    cough variant asthma  . Breast cancer ([place]) 2011   RIGHT BREAST CA  . Breast cancer, left ([place]) 09/2020   left breast IDC  . Cataract   . 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 Depression   . GERD (gastroesophageal reflux disease)   . Hyperlipidemia   . Hypertension   . Hypothyroidism   . Insomnia   . Thyroid disease    hypothyroidism                                           . CATARACT EXTRACTION, BILATERAL    . COLONOSCOPY  2015  . COLONOSCOPY WITH PROPOFOL N/A 04/14/2014   Procedure: COLONOSCOPY WITH PROPOFOL;  Surgeon: Garlan Fair, MD;  Location: [facility];  Service: Endoscopy;  Laterality: N/A;  . IR IMAGING GUIDED PORT INSERTION  12/01/2020  . POLYPECTOMY    . ROTATOR CUFF REPAIR Left 04/2013   Dr. Para March  Allergies  Allergen Reactions  . Advair Diskus [Fluticasone-Salmeterol] Other (See Comments)    Hoarseness.  . Neosporin [Neomycin-Polymyxin-Gramicidin] Itching                               Maternal Aunt        lung  . Cancer Paternal Uncle        colon, stomach  . Colon cancer Neg Hx   . Colon polyps Neg Hx   . Esophageal cancer Neg Hx   . Rectal cancer Neg Hx   . Stomach cancer Neg Hx      Socioeconomic History  . Marital status: Married    Spouse name: Not on file  . Number of children: Not on file  . Years of education: Not on file  . Highest education level: Not on file   Occupational History  . Not on file  Tobacco Use  . Smoking status: Former Smoker    Packs/day: 1.00    Years: 15.00    Pack years: 15.00    Types: Cigarettes    Quit date: 12/09/1978    Years since quitting: 42.0  . Smokeless tobacco: Never Used  Vaping Use  . Vaping Use: Never used  Substance and Sexual Activity  . Alcohol use: Yes    Alcohol/week: 7.0 standard drinks    Types: 7 Glasses of wine per week    Comment: SOCIALLY wine  . Drug use: No  . Sexual activity: Yes    Birth control/protection: Surgical  Other Topics Concern  . Not on file  Social History Narrative  . Not on file   Social Determinants of Health   Financial Resource Strain: Not on file  Food Insecurity: Not on file  Transportation Needs: Not on file  Physical Activity: Not on file  Stress: Not on file  Social Connections: Not on file  Intimate Partner Violence: Not At Risk  . Fear of Current or Ex-Partner: No  . Emotionally Abused: No  . Physically Abused: No  . Sexually Abused: No  :  Review of Systems: A comprehensive 14 point review of systems was negative except as noted in the HPI.  Exam: BP 114/61 (BP Location: Right Arm)   Pulse 79   Temp 98.3 F (36.8 C) (Oral)   Resp 16   SpO2 100%   General:  well-nourished in no acute distress.   Eyes:  no scleral icterus.   ENT:  There were no oropharyngeal lesions.    Lymphatics:  Negative cervical, supraclavicular or axillary adenopathy.   Respiratory: lungs were clear bilaterally without wheezing or crackles.   Cardiovascular:  Regular rate and rhythm, S1/S2, without murmur, rub or gallop.  There was no pedal edema.   GI:  abdomen was soft, flat, nontender, nondistended, without organomegaly.   Musculoskeletal:  no spinal tenderness of palpation of vertebral spine.   Skin exam was without echymosis, petichae.   Neuro exam was nonfocal. Patient was alert and oriented.  Attention was good.   Language was appropriate.  Mood was normal  without depression.  Speech was not pressured.  Thought content was not tangential.     Lab Results  Component Value Date   WBC 11.1 (H) 12/24/2020   HGB 9.7 (L) 12/24/2020   HCT 31.0 (L) 12/24/2020   PLT 246 12/24/2020   GLUCOSE 90 12/24/2020   CHOL 94 (L) 12/19/2019   TRIG 54 12/19/2019   HDL 32 (L)  12/19/2019   LDLCALC 49 12/19/2019   ALT 18 12/24/2020   AST 19 12/24/2020   NA 139 12/24/2020   K 4.1 12/24/2020   CL 107 12/24/2020   CREATININE 0.65 12/24/2020   BUN 16 12/24/2020   CO2 24 12/24/2020    NM PET Image Initial (PI) Skull Base To Thigh  Result Date: 12/18/2020 CLINICAL DATA:  Initial treatment strategy for new diagnosis of large B-cell lymphoma involving the distal small bowel and cecum. Chemotherapy including on 12/06/2020. EXAM: NUCLEAR MEDICINE PET SKULL BASE TO THIGH TECHNIQUE: 6.4 mCi F-18 FDG was injected intravenously. Full-ring PET imaging was performed from the skull base to thigh after the radiotracer. CT data was obtained and used for attenuation correction and anatomic localization. Fasting blood glucose: 103 mg/dl COMPARISON:  10/21/2020 abdominopelvic CT.  CTA chest of 02/12/2006. FINDINGS: Motion degraded CT images. Mediastinal blood pool activity: SUV max 1.4 Liver activity: SUV max 2.3 NECK: No areas of abnormal hypermetabolism. Incidental CT findings: No cervical adenopathy. Presumed sebaceous cyst about the posterior right neck at 8 mm on 12/04. CHEST: No pulmonary parenchymal or thoracic nodal hypermetabolism. Incidental CT findings:Mild aortic and lad coronary artery calcification. Right Port-A-Cath tip at mid right atrium. ABDOMEN/PELVIS: The right lower quadrant/ileocecal mass is less distinct today, given motion degradation in this area. Corresponds to hypermetabolism at a S.U.V. max of 6.7 including on 151/4. No abdominopelvic nodal hypermetabolism. The spleen is normal in size, mildly hypermetabolic relative to the liver. Incidental CT findings: Aortic  atherosclerosis. Hysterectomy. Pelvic floor laxity. Large colonic stool burden. Mild hepatic steatosis. SKELETON: Diffuse moderate marrow hypermetabolism, with an example area in the L4 vertebral body measuring a S.U.V. max of 9.0. Incidental CT findings: none IMPRESSION: 1. Hypermetabolism corresponding to the right lower quadrant, ileocolic mass consistent with the clinical history of bowel lymphoma. 2. Diffuse marrow hypermetabolism which could represent marrow stimulation by chemotherapy or marrow involvement by lymphoma. 3. Mild hypermetabolism of the spleen relative to the liver, nonspecific. No splenomegaly. 4. Incidental findings, including: Coronary artery atherosclerosis. Aortic Atherosclerosis (ICD10-I70.0). Hepatic steatosis. Possible constipation. 5. Motion degraded CT images. The right lower lobe pulmonary nodule described on prior CTs is not readily apparent but likely below PET resolution. Electronically Signed   By: Abigail Miyamoto M.D.   On: 12/18/2020 18:07   IR IMAGING GUIDED PORT INSERTION  Result Date: 12/01/2020 CLINICAL DATA:  Left breast carcinoma and diffuse large B-cell lymphoma. The patient requires a porta cath to begin chemotherapy. EXAM: IMPLANTED PORT A CATH PLACEMENT WITH ULTRASOUND AND FLUOROSCOPIC GUIDANCE ANESTHESIA/SEDATION: 2.0 mg IV Versed; 100 mcg IV Fentanyl Total Moderate Sedation Time:  34 minutes The patient's level of consciousness and physiologic status were continuously monitored during the procedure by Radiology nursing. FLUOROSCOPY TIME:  24 seconds.  3.0 mGy. PROCEDURE: The procedure, risks, benefits, and alternatives were explained to the patient. Questions regarding the procedure were encouraged and answered. The patient understands and consents to the procedure. A time-out was performed prior to initiating the procedure. Ultrasound was utilized to confirm patency of the right internal jugular vein. The right neck and chest were prepped with chlorhexidine in a  sterile fashion, and a sterile drape was applied covering the operative field. Maximum barrier sterile technique with sterile gowns and gloves were used for the procedure. Local anesthesia was provided with 1% lidocaine. After creating a small venotomy incision, a 21 gauge needle was advanced into the right internal jugular vein under direct, real-time ultrasound guidance. Ultrasound image documentation was performed. After  securing guidewire access, an 8 Fr dilator was placed. A J-wire was kinked to measure appropriate catheter length. A subcutaneous port pocket was then created along the upper chest wall utilizing sharp and blunt dissection. Portable cautery was utilized. The pocket was irrigated with sterile saline. A single lumen power injectable port was chosen for placement. The 8 Fr catheter was tunneled from the port pocket site to the venotomy incision. The port was placed in the pocket. External catheter was trimmed to appropriate length based on guidewire measurement. At the venotomy, an 8 Fr peel-away sheath was placed over a guidewire. The catheter was then placed through the sheath and the sheath removed. Final catheter positioning was confirmed and documented with a fluoroscopic spot image. The port was accessed with a needle and aspirated and flushed with heparinized saline. The access needle was removed. The venotomy and port pocket incisions were closed with subcutaneous 3-0 Monocryl and subcuticular 4-0 Vicryl. Dermabond was applied to both incisions. COMPLICATIONS: COMPLICATIONS None FINDINGS: After catheter placement, the tip lies at the cavo-atrial junction. The catheter aspirates normally and is ready for immediate use. IMPRESSION: Placement of single lumen port a cath via right internal jugular vein. The catheter tip lies at the cavo-atrial junction. A power injectable port a cath was placed and is ready for immediate use. Electronically Signed   By: Aletta Edouard M.D.   On: 12/01/2020  11:20     NM PET Image Initial (PI) Skull Base To Thigh  Result Date: 12/18/2020 CLINICAL DATA:  Initial treatment strategy for new diagnosis of large B-cell lymphoma involving the distal small bowel and cecum. Chemotherapy including on 12/06/2020. EXAM: NUCLEAR MEDICINE PET SKULL BASE TO THIGH TECHNIQUE: 6.4 mCi F-18 FDG was injected intravenously. Full-ring PET imaging was performed from the skull base to thigh after the radiotracer. CT data was obtained and used for attenuation correction and anatomic localization. Fasting blood glucose: 103 mg/dl COMPARISON:  10/21/2020 abdominopelvic CT.  CTA chest of 02/12/2006. FINDINGS: Motion degraded CT images. Mediastinal blood pool activity: SUV max 1.4 Liver activity: SUV max 2.3 NECK: No areas of abnormal hypermetabolism. Incidental CT findings: No cervical adenopathy. Presumed sebaceous cyst about the posterior right neck at 8 mm on 12/04. CHEST: No pulmonary parenchymal or thoracic nodal hypermetabolism. Incidental CT findings:Mild aortic and lad coronary artery calcification. Right Port-A-Cath tip at mid right atrium. ABDOMEN/PELVIS: The right lower quadrant/ileocecal mass is less distinct today, given motion degradation in this area. Corresponds to hypermetabolism at a S.U.V. max of 6.7 including on 151/4. No abdominopelvic nodal hypermetabolism. The spleen is normal in size, mildly hypermetabolic relative to the liver. Incidental CT findings: Aortic atherosclerosis. Hysterectomy. Pelvic floor laxity. Large colonic stool burden. Mild hepatic steatosis. SKELETON: Diffuse moderate marrow hypermetabolism, with an example area in the L4 vertebral body measuring a S.U.V. max of 9.0. Incidental CT findings: none IMPRESSION: 1. Hypermetabolism corresponding to the right lower quadrant, ileocolic mass consistent with the clinical history of bowel lymphoma. 2. Diffuse marrow hypermetabolism which could represent marrow stimulation by chemotherapy or marrow involvement  by lymphoma. 3. Mild hypermetabolism of the spleen relative to the liver, nonspecific. No splenomegaly. 4. Incidental findings, including: Coronary artery atherosclerosis. Aortic Atherosclerosis (ICD10-I70.0). Hepatic steatosis. Possible constipation. 5. Motion degraded CT images. The right lower lobe pulmonary nodule described on prior CTs is not readily apparent but likely below PET resolution. Electronically Signed   By: Abigail Miyamoto M.D.   On: 12/18/2020 18:07   IR IMAGING GUIDED PORT INSERTION  Result Date:  12/01/2020 CLINICAL DATA:  Left breast carcinoma and diffuse large B-cell lymphoma. The patient requires a porta cath to begin chemotherapy. EXAM: IMPLANTED PORT A CATH PLACEMENT WITH ULTRASOUND AND FLUOROSCOPIC GUIDANCE ANESTHESIA/SEDATION: 2.0 mg IV Versed; 100 mcg IV Fentanyl Total Moderate Sedation Time:  34 minutes The patient's level of consciousness and physiologic status were continuously monitored during the procedure by Radiology nursing. FLUOROSCOPY TIME:  24 seconds.  3.0 mGy. PROCEDURE: The procedure, risks, benefits, and alternatives were explained to the patient. Questions regarding the procedure were encouraged and answered. The patient understands and consents to the procedure. A time-out was performed prior to initiating the procedure. Ultrasound was utilized to confirm patency of the right internal jugular vein. The right neck and chest were prepped with chlorhexidine in a sterile fashion, and a sterile drape was applied covering the operative field. Maximum barrier sterile technique with sterile gowns and gloves were used for the procedure. Local anesthesia was provided with 1% lidocaine. After creating a small venotomy incision, a 21 gauge needle was advanced into the right internal jugular vein under direct, real-time ultrasound guidance. Ultrasound image documentation was performed. After securing guidewire access, an 8 Fr dilator was placed. A J-wire was kinked to measure  appropriate catheter length. A subcutaneous port pocket was then created along the upper chest wall utilizing sharp and blunt dissection. Portable cautery was utilized. The pocket was irrigated with sterile saline. A single lumen power injectable port was chosen for placement. The 8 Fr catheter was tunneled from the port pocket site to the venotomy incision. The port was placed in the pocket. External catheter was trimmed to appropriate length based on guidewire measurement. At the venotomy, an 8 Fr peel-away sheath was placed over a guidewire. The catheter was then placed through the sheath and the sheath removed. Final catheter positioning was confirmed and documented with a fluoroscopic spot image. The port was accessed with a needle and aspirated and flushed with heparinized saline. The access needle was removed. The venotomy and port pocket incisions were closed with subcutaneous 3-0 Monocryl and subcuticular 4-0 Vicryl. Dermabond was applied to both incisions. COMPLICATIONS: COMPLICATIONS None FINDINGS: After catheter placement, the tip lies at the cavo-atrial junction. The catheter aspirates normally and is ready for immediate use. IMPRESSION: Placement of single lumen port a cath via right internal jugular vein. The catheter tip lies at the cavo-atrial junction. A power injectable port a cath was placed and is ready for immediate use. Electronically Signed   By: Aletta Edouard M.D.   On: 12/01/2020 11:20    Pathology:  Diagnosis Colon, biopsy - INVOLVEMENT BY LARGE B-CELL LYMPHOMA, SEE COMMENT. Microscopic Comment Fragments of colonic mucosa reveal effacement by a dense lymphoid infiltrate. There is crush artifact, but intact areas reveal sheets of large lymphoid cells with irregular nuclear contours and prominent nucleoli. The cells are positive for CD45, CD20, bcl-6, bcl-2, CD21, and MUM-1. They are negative for CD10, CD30, and EBV in situ hybridization. Ki-67 is elevated. CD3 and CD5 highlight  scattered T-cells. The overall findings are consistent with involvement by a large B-cell lymphoma. The differential includes a diffuse large B-cell lymphoma, activated B-cell type or an aggressive B-cell lymphoma (double hit lymphoma). FISH for MYC, BCL-2, and BCL-6 will be attempted and reported in an addendum. Dr. Silverio Decamp was notified on 11/10/2020. Vicente Males MD Pathologist, Electronic Signature (Case signed 11/10/2020)  Assessment and Plan:   1) Large B-cell lymphoma -CT abdomen/pelvis with contrast 10/21/2020 - "Acute colitis/enteritis of the cecum/ascending colon  and the associated terminal loops of ileum. As was recently described on CT imaging, the anatomy of distal ileum, ileocecal valve, cecum is atypical and correlation with patient's prior surgical record would be useful. The acute inflammatory changes contribute to at least a partial small bowel obstruction. Primary concern of this inflammatory cystic mass/bowel of the right lower quadrant is malignancy given that the posterior margin is inseparable from the psoas muscle, adnexa, and the right ureter. Correlation with CEA may be useful. Inflammatory/infectious changes are also a consideration." -Colonoscopy performed 11/04/2020- "An infiltrative and ulcerated partially obstructing large mass was found at 85 cm proximal to the anus. 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 --Patient initiated treatment with EPOCH on 11/15/2020. --PET scan from 12/17/2020 - "hypermetabolism corresponding to the right lower quadrant, ileocolic mass consistent with the clinical history of bowel lymphoma. Diffuse marrow hypermetabolism which could represent marrow stimulation by chemotherapy or marrow involvement by lymphoma. Mild hypermetabolism of the spleen relative to the liver,nonspecific. No splenomegaly.Incidental findings, including: Coronary artery atherosclerosis.Aortic Atherosclerosis (ICD10-I70.0).  Hepatic steatosis. Possible constipation. Motion degraded CT images. The right lower lobe pulmonary nodule described on prior CTs is not readily apparent but likely below PET Resolution.  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  Will review prior to initiation of chemotherapy. Check CBC with differential and CMET daily. -Continue as needed antiemetics. -MiraLAX and Senokot ordered for constipation. -Lovenox for DVT prophylaxis. -Continue home medications. -Lab, Rituxan, and Neulasta scheduled for 6/13 and she will have a lab, follow-up visit, and possible transfusion 6/17, and a lab, follow-up visit, and possible transfusion on 6/21.  Mikey Bussing, DNP, AGPCNP-BC, AOCNP

## 2021-02-08 ENCOUNTER — Encounter: Payer: Self-pay | Admitting: Hematology and Oncology

## 2021-02-08 ENCOUNTER — Telehealth: Payer: Self-pay | Admitting: Hematology and Oncology

## 2021-02-08 LAB — CBC WITH DIFFERENTIAL/PLATELET
Abs Immature Granulocytes: 0.33 10*3/uL — ABNORMAL HIGH (ref 0.00–0.07)
Basophils Absolute: 0 10*3/uL (ref 0.0–0.1)
Basophils Relative: 0 %
Eosinophils Absolute: 0 10*3/uL (ref 0.0–0.5)
Eosinophils Relative: 0 %
HCT: 25.4 % — ABNORMAL LOW (ref 36.0–46.0)
Hemoglobin: 8.1 g/dL — ABNORMAL LOW (ref 12.0–15.0)
Immature Granulocytes: 5 %
Lymphocytes Relative: 9 %
Lymphs Abs: 0.5 10*3/uL — ABNORMAL LOW (ref 0.7–4.0)
MCH: 29.3 pg (ref 26.0–34.0)
MCHC: 31.9 g/dL (ref 30.0–36.0)
MCV: 92 fL (ref 80.0–100.0)
Monocytes Absolute: 0.5 10*3/uL (ref 0.1–1.0)
Monocytes Relative: 8 %
Neutro Abs: 4.9 10*3/uL (ref 1.7–7.7)
Neutrophils Relative %: 78 %
Platelets: 355 10*3/uL (ref 150–400)
RBC: 2.76 MIL/uL — ABNORMAL LOW (ref 3.87–5.11)
RDW: 18.2 % — ABNORMAL HIGH (ref 11.5–15.5)
WBC: 6.2 10*3/uL (ref 4.0–10.5)
nRBC: 0 % (ref 0.0–0.2)

## 2021-02-08 LAB — COMPREHENSIVE METABOLIC PANEL
ALT: 23 U/L (ref 0–44)
AST: 12 U/L — ABNORMAL LOW (ref 15–41)
Albumin: 2.4 g/dL — ABNORMAL LOW (ref 3.5–5.0)
Alkaline Phosphatase: 75 U/L (ref 38–126)
Anion gap: 5 (ref 5–15)
BUN: 14 mg/dL (ref 8–23)
CO2: 24 mmol/L (ref 22–32)
Calcium: 8.4 mg/dL — ABNORMAL LOW (ref 8.9–10.3)
Chloride: 110 mmol/L (ref 98–111)
Creatinine, Ser: 0.49 mg/dL (ref 0.44–1.00)
GFR, Estimated: >60 mL/min (ref >60)
Glucose, Bld: 126 mg/dL — ABNORMAL HIGH (ref 70–99)
Potassium: 3.6 mmol/L (ref 3.5–5.1)
Sodium: 139 mmol/L (ref 135–145)
Total Bilirubin: 0.5 mg/dL (ref 0.3–1.2)
Total Protein: 5 g/dL — ABNORMAL LOW (ref 6.5–8.1)

## 2021-02-08 MED ORDER — SODIUM CHLORIDE 0.9 % IV SOLN
Freq: Once | INTRAVENOUS | Status: AC
  Administered 2021-02-08: 8 mg via INTRAVENOUS
  Filled 2021-02-08 (×2): qty 4

## 2021-02-08 MED ORDER — ETOPOSIDE CHEMO INJECTION 500 MG/25ML
Freq: Once | INTRAVENOUS | Status: AC
  Filled 2021-02-08: qty 8

## 2021-02-08 NOTE — Telephone Encounter (Signed)
Scheduled per 06/07 sch msg, Patient is aware.

## 2021-02-08 NOTE — Progress Notes (Signed)
HEMATOLOGY-ONCOLOGY PROGRESS NOTE  SUBJECTIVE: The patient tolerated day 1 of cycle #5 of her chemotherapy well overall.  She has no complaints this morning.  She has not had any fevers and denies chills.  Reports that her appetite has improved.  She denies mucositis, nausea, vomiting, constipation, diarrhea.  Bowels moved yesterday after taking MiraLAX.  Denies bleeding.  She is ambulating in the hallway without any difficulty.  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 Biopsy showed invasive and in situ ductal carcinoma, HER-2 equivocal by IHC (2+), negative by FISH (ratio 1.59), ER+ >95%, PR+ 85%, Ki67 10%.    Malignant neoplasm of left breast in female, estrogen receptor positive ([place])  10/12/2020 Initial Diagnosis   Malignant neoplasm of left breast in female, estrogen receptor positive ([place])   10/12/2020 Cancer Staging   Staging form: Breast, AJCC 8th Edition - Clinical stage from 10/12/2020: Stage IA (cT1b, cN0, cM0, G2, ER+, PR+, HER2-) - Signed by Nicholas Lose, MD on 10/15/2020 Stage prefix: Initial diagnosis   11/05/2020 Surgery   Left lumpectomy: Grade 1 IDC, 1.1 cm, intermediate grade DCIS, margins are negative, lymph node -0/1, ER greater than 95%, PR 85%, HER-2 negative, Ki-67 10%   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[place])  11/11/2020 Initial Diagnosis   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[place])   11/15/2020 -  Chemotherapy    Patient is on Treatment Plan: IP NON-HODGKINS LYMPHOMA EPOCH Q21D   Patient is on Antibody Plan: NON-HODGKINS LYMPHOMA RITUXIMAB Q21D    11/19/2020 -   Chemotherapy    Patient is on Treatment Plan: IP NON-HODGKINS LYMPHOMA EPOCH Q21D   Patient is on Antibody Plan: NON-HODGKINS LYMPHOMA RITUXIMAB Q21D       REVIEW OF SYSTEMS:   Constitutional: Denies fevers, chills  Eyes: Denies blurriness of vision Ears, nose, mouth, throat, and face: Denies mucositis or sore throat Respiratory: Denies cough, dyspnea or wheezes Cardiovascular: Denies palpitation, chest discomfort Gastrointestinal:  Denies nausea, heartburn or change in bowel habits Skin: Denies abnormal skin rashes Lymphatics: Denies new lymphadenopathy or easy bruising Neurological: Denies dizziness and headache Behavioral/Psych: Mood is stable, no new changes  Extremities: No lower extremity edema All other systems were reviewed with the patient and are negative.  I have reviewed the past medical history, past surgical history, social history and family history with the patient and they are unchanged from previous note.   PHYSICAL EXAMINATION: ECOG PERFORMANCE STATUS: 1 - Symptomatic but completely ambulatory  Vitals:   02/07/21 2046 02/08/21 0500  BP: 100/62 111/66  Pulse: 77 82  Resp: 16 16  Temp: 97.9 F (36.6 C) 97.6 F (36.4 C)  SpO2: 98% 97%   There were no vitals filed for this visit.  Intake/Output from previous day: 06/06 0701 - 06/07 0700 In: 1217.8 [P.O.:220; I.V.:294; IV Piggyback:703.8] Out: -   GENERAL:alert, no distress and comfortable SKIN: skin color, texture, turgor are normal, no rashes or significant lesions EYES: normal, Conjunctiva are pink and non-injected, sclera clear OROPHARYNX:no exudate, no erythema and lips, buccal mucosa, and tongue normal  LUNGS: clear to auscultation and  percussion with normal breathing effort HEART: regular rate & rhythm and no murmurs and no lower extremity edema ABDOMEN:abdomen soft, non-tender and normal bowel sounds Musculoskeletal:no cyanosis of digits and no clubbing  NEURO: alert & oriented x 3 with  fluent speech, no focal motor/sensory deficits  LABORATORY DATA:  I have reviewed the data as listed CMP Latest Ref Rng & Units 02/08/2021 02/07/2021 02/02/2021  Glucose 70 - 99 mg/dL 126(H) 101(H) 87  BUN 8 - 23 mg/dL $Remove'14 17 12  'UAMzcey$ Creatinine 0.44 - 1.00 mg/dL 0.49 0.58 0.68  Sodium 135 - 145 mmol/L 139 137 140  Potassium 3.5 - 5.1 mmol/L 3.6 2.9(L) 3.0(L)  Chloride 98 - 111 mmol/L 110 105 103  CO2 22 - 32 mmol/L $RemoveB'24 22 24  'ESaBYNhp$ Calcium 8.9 - 10.3 mg/dL 8.4(L) 8.7(L) 8.6(L)  Total Protein 6.5 - 8.1 g/dL 5.0(L) 6.1(L) 5.9(L)  Total Bilirubin 0.3 - 1.2 mg/dL 0.5 0.8 0.6  Alkaline Phos 38 - 126 U/L 75 74 92  AST 15 - 41 U/L 12(L) 22 24  ALT 0 - 44 U/L 23 32 26    Lab Results  Component Value Date   WBC 6.2 02/08/2021   HGB 8.1 (L) 02/08/2021   HCT 25.4 (L) 02/08/2021   MCV 92.0 02/08/2021   PLT 355 02/08/2021   NEUTROABS 4.9 02/08/2021    No results found.  ASSESSMENT AND PLAN: 1) large B-cell lymphoma -CT abdomen/pelvis with contrast 10/21/2020 - "Acute colitis/enteritis of the cecum/ascending colon and the associated terminal loops of ileum. As was recently described on CT imaging, the anatomy of distal ileum, ileocecal valve, cecum is atypical and correlation with patient's prior surgical record would be useful. The acute inflammatory changes contribute to at least a partial small bowel obstruction. Primary concern of this inflammatory cystic mass/bowel of the right lower quadrant is malignancy given that the posterior margin is inseparable from the psoas muscle, adnexa, and the right ureter. Correlation with CEA may be useful. Inflammatory/infectious changes are also a consideration." -Colonoscopy performed 11/04/2020- "An infiltrative and ulcerated partially obstructing large mass was found at 85 cm proximal to the anus. 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  She has no evidence of bleeding.  She was having decreased appetite, nausea, vomiting, diarrhea prior to admission.  The symptoms have now all resolved.  Suspect sample from yesterday was hemoconcentrated.  We will plan for PRBC transfusion if hemoglobin is less than 8.  She is aware that we may need to give her a unit of blood prior to discharge. -Potassium on admission was low at 2.9.  She received K-Dur 40 mEq x 1 dose yesterday with normalization of her potassium level.  We will continue to monitor daily and administer potassium as needed. -Labs are adequate to proceed with day 2 of cycle #5 of her chemotherapy today as planned. -Will monitor daily CBC with differential and  CMET. -Continue as needed antiemetics. -Continue MiraLAX and Senokot-S. -Lovenox for DVT prophylaxis. -Continue home medications. -Continue PPI and as needed Maalox for reflux symptoms. -Labs, Rituxan, Neulasta 6/13 at [facility].  Labs, visit, possible PRBC transfusion 6/17 and again on 6/21.   LOS: 1 day   Mikey Bussing, DNP, AGPCNP-BC, AOCNP 02/08/21

## 2021-02-08 NOTE — Progress Notes (Signed)
Patient refuses Pulmicort and wishes to use her home Flovent, which she has at the bedside. RN aware. Order discontinued.

## 2021-02-09 ENCOUNTER — Encounter: Payer: Self-pay | Admitting: Hematology and Oncology

## 2021-02-09 LAB — COMPREHENSIVE METABOLIC PANEL
ALT: 22 U/L (ref 0–44)
AST: 14 U/L — ABNORMAL LOW (ref 15–41)
Albumin: 2.5 g/dL — ABNORMAL LOW (ref 3.5–5.0)
Alkaline Phosphatase: 64 U/L (ref 38–126)
Anion gap: 5 (ref 5–15)
BUN: 21 mg/dL (ref 8–23)
CO2: 24 mmol/L (ref 22–32)
Calcium: 8.4 mg/dL — ABNORMAL LOW (ref 8.9–10.3)
Chloride: 111 mmol/L (ref 98–111)
Creatinine, Ser: 0.66 mg/dL (ref 0.44–1.00)
GFR, Estimated: >60 mL/min (ref >60)
Glucose, Bld: 100 mg/dL — ABNORMAL HIGH (ref 70–99)
Potassium: 3.8 mmol/L (ref 3.5–5.1)
Sodium: 140 mmol/L (ref 135–145)
Total Bilirubin: 0.4 mg/dL (ref 0.3–1.2)
Total Protein: 4.9 g/dL — ABNORMAL LOW (ref 6.5–8.1)

## 2021-02-09 LAB — CBC WITH DIFFERENTIAL/PLATELET
Abs Immature Granulocytes: 0.28 10*3/uL — ABNORMAL HIGH (ref 0.00–0.07)
Basophils Absolute: 0 10*3/uL (ref 0.0–0.1)
Basophils Relative: 0 %
Eosinophils Absolute: 0 10*3/uL (ref 0.0–0.5)
Eosinophils Relative: 0 %
HCT: 25.9 % — ABNORMAL LOW (ref 36.0–46.0)
Hemoglobin: 8.3 g/dL — ABNORMAL LOW (ref 12.0–15.0)
Immature Granulocytes: 2 %
Lymphocytes Relative: 4 %
Lymphs Abs: 0.6 10*3/uL — ABNORMAL LOW (ref 0.7–4.0)
MCH: 29.6 pg (ref 26.0–34.0)
MCHC: 32 g/dL (ref 30.0–36.0)
MCV: 92.5 fL (ref 80.0–100.0)
Monocytes Absolute: 1.1 10*3/uL — ABNORMAL HIGH (ref 0.1–1.0)
Monocytes Relative: 8 %
Neutro Abs: 12.2 10*3/uL — ABNORMAL HIGH (ref 1.7–7.7)
Neutrophils Relative %: 86 %
Platelets: 404 10*3/uL — ABNORMAL HIGH (ref 150–400)
RBC: 2.8 MIL/uL — ABNORMAL LOW (ref 3.87–5.11)
RDW: 17.8 % — ABNORMAL HIGH (ref 11.5–15.5)
WBC: 14.2 10*3/uL — ABNORMAL HIGH (ref 4.0–10.5)
nRBC: 0 % (ref 0.0–0.2)

## 2021-02-09 MED ORDER — ETOPOSIDE CHEMO INJECTION 500 MG/25ML
Freq: Once | INTRAVENOUS | Status: AC
  Filled 2021-02-09: qty 8

## 2021-02-09 MED ORDER — SODIUM CHLORIDE 0.9 % IV SOLN
Freq: Once | INTRAVENOUS | Status: AC
  Administered 2021-02-09: 8 mg via INTRAVENOUS
  Filled 2021-02-09: qty 4

## 2021-02-09 NOTE — Progress Notes (Signed)
Chemotherapy dosage and calculations verified with Joycelyn Das, RN.

## 2021-02-09 NOTE — Progress Notes (Signed)
HEMATOLOGY-ONCOLOGY PROGRESS NOTE  SUBJECTIVE: The patient tolerated day 2 of cycle #5 of her chemotherapy well overall, with the exception of some loose stools this morning.  She is currently having some abdominal cramping and mild discomfort, which she notes is typically relieved with a bowel movement.  She has not had any fevers and denies chills.  Reports that her appetite was not as good this AM as she did not want to eat with her abdominal discomfort.  She denies mucositis, nausea, vomiting, constipation, diarrhea.  Denies bleeding.  She is ambulating in the hallway without any difficulty.  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 Biopsy showed invasive and in situ ductal carcinoma, HER-2 equivocal by IHC (2+), negative by FISH (ratio 1.59), ER+ >95%, PR+ 85%, Ki67 10%.    Malignant neoplasm of left breast in female, estrogen receptor positive ([place])  10/12/2020 Initial Diagnosis   Malignant neoplasm of left breast in female, estrogen receptor positive ([place])   10/12/2020 Cancer Staging   Staging form: Breast, AJCC 8th Edition - Clinical stage from 10/12/2020: Stage IA (cT1b, cN0, cM0, G2, ER+, PR+, HER2-) - Signed by Nicholas Lose, MD on 10/15/2020 Stage prefix: Initial diagnosis   11/05/2020 Surgery   Left lumpectomy: Grade 1 IDC, 1.1 cm, intermediate grade DCIS, margins are negative, lymph node -0/1, ER greater than 95%, PR 85%, HER-2 negative, Ki-67 10%   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[place])  11/11/2020 Initial Diagnosis   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[place])   11/15/2020 -   Chemotherapy    Patient is on Treatment Plan: IP NON-HODGKINS LYMPHOMA EPOCH Q21D   Patient is on Antibody Plan: NON-HODGKINS LYMPHOMA RITUXIMAB Q21D    11/19/2020 -  Chemotherapy    Patient is on Treatment Plan: IP NON-HODGKINS LYMPHOMA EPOCH Q21D   Patient is on Antibody Plan: NON-HODGKINS LYMPHOMA RITUXIMAB Q21D       REVIEW OF SYSTEMS:   Constitutional: Denies fevers, chills  Eyes: Denies blurriness of vision Ears, nose, mouth, throat, and face: Denies mucositis or sore throat Respiratory: Denies cough, dyspnea or wheezes Cardiovascular: Denies palpitation, chest discomfort Gastrointestinal:  Denies nausea, heartburn or change in bowel habits Skin: Denies abnormal skin rashes Lymphatics: Denies new lymphadenopathy or easy bruising Neurological: Denies dizziness and headache Behavioral/Psych: Mood is stable, no new changes  Extremities: No lower extremity edema All other systems were reviewed with the patient and are negative.  I have reviewed the past medical history, past surgical history, social history and family history with the patient and they are unchanged from previous note.   PHYSICAL EXAMINATION: ECOG PERFORMANCE STATUS: 1 - Symptomatic but completely ambulatory  Vitals:   02/09/21 0700 02/09/21 1350  BP: 115/60 120/73  Pulse: 70 74  Resp: 16 17  Temp: 98 F (36.7 C) 98.1 F (36.7 C)  SpO2: 94% 94%   There were no vitals filed for this visit.  Intake/Output from previous day: 06/07 0701 - 06/08 0700 In: 480 [P.O.:480] Out: -   GENERAL:alert, no distress and comfortable SKIN: skin color, texture, turgor are normal, no rashes or  significant lesions EYES: normal, Conjunctiva are pink and non-injected, sclera clear OROPHARYNX:no exudate, no erythema and lips, buccal mucosa, and tongue normal  LUNGS: clear to auscultation and percussion with normal breathing effort HEART: regular rate & rhythm and no murmurs and no lower extremity edema ABDOMEN:abdomen  soft, non-tender and normal bowel sounds Musculoskeletal:no cyanosis of digits and no clubbing  NEURO: alert & oriented x 3 with fluent speech, no focal motor/sensory deficits  LABORATORY DATA:  I have reviewed the data as listed CMP Latest Ref Rng & Units 02/09/2021 02/08/2021 02/07/2021  Glucose 70 - 99 mg/dL 100(H) 126(H) 101(H)  BUN 8 - 23 mg/dL $Remove'21 14 17  'atmVkPm$ Creatinine 0.44 - 1.00 mg/dL 0.66 0.49 0.58  Sodium 135 - 145 mmol/L 140 139 137  Potassium 3.5 - 5.1 mmol/L 3.8 3.6 2.9(L)  Chloride 98 - 111 mmol/L 111 110 105  CO2 22 - 32 mmol/L $RemoveB'24 24 22  'nVNtsiBh$ Calcium 8.9 - 10.3 mg/dL 8.4(L) 8.4(L) 8.7(L)  Total Protein 6.5 - 8.1 g/dL 4.9(L) 5.0(L) 6.1(L)  Total Bilirubin 0.3 - 1.2 mg/dL 0.4 0.5 0.8  Alkaline Phos 38 - 126 U/L 64 75 74  AST 15 - 41 U/L 14(L) 12(L) 22  ALT 0 - 44 U/L 22 23 32    Lab Results  Component Value Date   WBC 14.2 (H) 02/09/2021   HGB 8.3 (L) 02/09/2021   HCT 25.9 (L) 02/09/2021   MCV 92.5 02/09/2021   PLT 404 (H) 02/09/2021   NEUTROABS 12.2 (H) 02/09/2021    No results found.  ASSESSMENT AND PLAN: 1) large B-cell lymphoma -CT abdomen/pelvis with contrast 10/21/2020 - "Acute colitis/enteritis of the cecum/ascending colon and the associated terminal loops of ileum. As was recently described on CT imaging, the anatomy of distal ileum, ileocecal valve, cecum is atypical and correlation with patient's prior surgical record would be useful. The acute inflammatory changes contribute to at least a partial small bowel obstruction. Primary concern of this inflammatory cystic mass/bowel of the right lower quadrant is malignancy given that the posterior margin is inseparable from the psoas muscle, adnexa, and the right ureter. Correlation with CEA may be useful. Inflammatory/infectious changes are also a consideration." -Colonoscopy performed 11/04/2020- "An infiltrative and ulcerated partially obstructing large mass was found at 85 cm proximal to the anus. 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  She has no evidence of bleeding.  She was having decreased appetite, nausea, vomiting, diarrhea prior to admission.  The symptoms have now all resolved.  Suspect sample from yesterday was hemoconcentrated.  We will plan for PRBC transfusion if hemoglobin is less than 8.  She is aware that we may need to give her a unit of blood prior to discharge. -Potassium on admission was low at 2.9.  She received K-Dur 40 mEq x 1 dose yesterday with normalization of her potassium level.  We will continue to monitor daily and administer potassium  as needed. -Labs are adequate to proceed with day 3 of cycle #5 of her chemotherapy today as planned. -Will monitor daily CBC with differential and CMET. -Continue as needed antiemetics. -Continue MiraLAX and Senokot-S. -Lovenox for DVT prophylaxis. -Continue home medications. -Continue PPI and as needed Maalox for reflux symptoms. -Labs, Rituxan, Neulasta 6/13 at [facility].  Labs, visit, possible PRBC transfusion 6/17 and again on 6/21.   LOS: 2 days   Ledell Peoples, MD Department of Hematology/Oncology [place] at [facility] Phone: [phone] Pager: [phone] Email: [name]

## 2021-02-10 ENCOUNTER — Encounter: Payer: Self-pay | Admitting: Hematology and Oncology

## 2021-02-10 LAB — COMPREHENSIVE METABOLIC PANEL
ALT: 28 U/L (ref 0–44)
AST: 21 U/L (ref 15–41)
Albumin: 2.5 g/dL — ABNORMAL LOW (ref 3.5–5.0)
Alkaline Phosphatase: 57 U/L (ref 38–126)
Anion gap: 5 (ref 5–15)
BUN: 19 mg/dL (ref 8–23)
CO2: 25 mmol/L (ref 22–32)
Calcium: 8.3 mg/dL — ABNORMAL LOW (ref 8.9–10.3)
Chloride: 110 mmol/L (ref 98–111)
Creatinine, Ser: 0.53 mg/dL (ref 0.44–1.00)
GFR, Estimated: >60 mL/min (ref >60)
Glucose, Bld: 96 mg/dL (ref 70–99)
Potassium: 3.9 mmol/L (ref 3.5–5.1)
Sodium: 140 mmol/L (ref 135–145)
Total Bilirubin: 0.5 mg/dL (ref 0.3–1.2)
Total Protein: 4.8 g/dL — ABNORMAL LOW (ref 6.5–8.1)

## 2021-02-10 LAB — CBC WITH DIFFERENTIAL/PLATELET
Abs Immature Granulocytes: 0.09 10*3/uL — ABNORMAL HIGH (ref 0.00–0.07)
Basophils Absolute: 0 10*3/uL (ref 0.0–0.1)
Basophils Relative: 0 %
Eosinophils Absolute: 0 10*3/uL (ref 0.0–0.5)
Eosinophils Relative: 0 %
HCT: 24.6 % — ABNORMAL LOW (ref 36.0–46.0)
Hemoglobin: 7.9 g/dL — ABNORMAL LOW (ref 12.0–15.0)
Immature Granulocytes: 2 %
Lymphocytes Relative: 7 %
Lymphs Abs: 0.4 10*3/uL — ABNORMAL LOW (ref 0.7–4.0)
MCH: 29.8 pg (ref 26.0–34.0)
MCHC: 32.1 g/dL (ref 30.0–36.0)
MCV: 92.8 fL (ref 80.0–100.0)
Monocytes Absolute: 0.4 10*3/uL (ref 0.1–1.0)
Monocytes Relative: 7 %
Neutro Abs: 4.9 10*3/uL (ref 1.7–7.7)
Neutrophils Relative %: 84 %
Platelets: 357 10*3/uL (ref 150–400)
RBC: 2.65 MIL/uL — ABNORMAL LOW (ref 3.87–5.11)
RDW: 17.8 % — ABNORMAL HIGH (ref 11.5–15.5)
WBC: 5.8 10*3/uL (ref 4.0–10.5)
nRBC: 0 % (ref 0.0–0.2)

## 2021-02-10 LAB — PREPARE RBC (CROSSMATCH)

## 2021-02-10 MED ORDER — VINCRISTINE SULFATE CHEMO INJECTION 1 MG/ML
Freq: Once | INTRAVENOUS | Status: AC
  Filled 2021-02-10: qty 8

## 2021-02-10 MED ORDER — ACETAMINOPHEN 325 MG PO TABS
650.0000 mg | ORAL_TABLET | Freq: Once | ORAL | Status: AC
  Administered 2021-02-11: 650 mg via ORAL
  Filled 2021-02-10: qty 2

## 2021-02-10 MED ORDER — SODIUM CHLORIDE 0.9 % IV SOLN
Freq: Once | INTRAVENOUS | Status: AC
  Administered 2021-02-11: 36 mg via INTRAVENOUS
  Filled 2021-02-10: qty 8

## 2021-02-10 MED ORDER — SODIUM CHLORIDE 0.9 % IV SOLN
750.0000 mg/m2 | Freq: Once | INTRAVENOUS | Status: AC
  Administered 2021-02-11: 1220 mg via INTRAVENOUS
  Filled 2021-02-10: qty 61

## 2021-02-10 MED ORDER — SODIUM CHLORIDE 0.9% IV SOLUTION: Freq: Once | INTRAVENOUS | Status: DC

## 2021-02-10 MED ORDER — ONDANSETRON HCL 40 MG/20ML IJ SOLN
Freq: Once | INTRAMUSCULAR | Status: AC
Start: 2021-02-10 — End: 2021-02-10
  Administered 2021-02-10: 18 mg via INTRAVENOUS
  Filled 2021-02-10: qty 4

## 2021-02-10 NOTE — Care Management Important Message (Signed)
Important Message  Patient Details IM Letter given to the Patient. Name: Lindsey Price MRN: [id number] Date of Birth: [birth year]   Medicare Important Message Given:  Yes     Kerin Salen 02/10/2021, 9:02 AM

## 2021-02-10 NOTE — Progress Notes (Signed)
HEMATOLOGY-ONCOLOGY PROGRESS NOTE  SUBJECTIVE: The patient tolerated day 3 of cycle #5 of her chemotherapy well overall, though she continues to have loose stools and abdominal cramping this morning.  She does have some mild bilateral swelling in her feet, but no shortness of breath.  She has not had any fevers and denies chills.  Reports she is trying to eat more. She continued to ambulate the halls.  She denies mucositis, nausea, vomiting, constipation, diarrhea.  Denies bleeding.  A full 10 point ROS is listed below.   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 Biopsy showed invasive and in situ ductal carcinoma, HER-2 equivocal by IHC (2+), negative by FISH (ratio 1.59), ER+ >95%, PR+ 85%, Ki67 10%.    Malignant neoplasm of left breast in female, estrogen receptor positive ([place])  10/12/2020 Initial Diagnosis   Malignant neoplasm of left breast in female, estrogen receptor positive ([place])    10/12/2020 Cancer Staging   Staging form: Breast, AJCC 8th Edition - Clinical stage from 10/12/2020: Stage IA (cT1b, cN0, cM0, G2, ER+, PR+, HER2-) - Signed by Nicholas Lose, MD on 10/15/2020  Stage prefix: Initial diagnosis    11/05/2020 Surgery   Left lumpectomy: Grade 1 IDC, 1.1 cm, intermediate grade DCIS, margins are negative, lymph node -0/1, ER greater than 95%, PR 85%, HER-2 negative, Ki-67 10%   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[place])  11/11/2020 Initial Diagnosis   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[place])    11/15/2020 -  Chemotherapy    Patient is on Treatment Plan: IP NON-HODGKINS  LYMPHOMA EPOCH Q21D   Patient is on Antibody Plan: NON-HODGKINS LYMPHOMA RITUXIMAB Q21D     11/19/2020 -  Chemotherapy    Patient is on Treatment Plan: IP NON-HODGKINS LYMPHOMA EPOCH Q21D   Patient is on Antibody Plan: NON-HODGKINS LYMPHOMA RITUXIMAB Q21D        REVIEW OF SYSTEMS:   Constitutional: Denies fevers, chills  Eyes: Denies blurriness of vision Ears, nose, mouth, throat, and face: Denies mucositis or sore throat Respiratory: Denies cough, dyspnea or wheezes Cardiovascular: Denies palpitation, chest discomfort Gastrointestinal:  Denies nausea, heartburn or change in bowel habits Skin: Denies abnormal skin rashes Lymphatics: Denies new lymphadenopathy or easy bruising Neurological: Denies dizziness and headache Behavioral/Psych: Mood is stable, no new changes  Extremities: No lower extremity edema All other systems were reviewed with the patient and are negative.  I have reviewed the past medical history, past surgical history, social history and family history with the patient and they are unchanged from previous note.   PHYSICAL EXAMINATION: ECOG PERFORMANCE STATUS: 1 - Symptomatic but completely ambulatory  Vitals:   02/10/21 0425 02/10/21 1413  BP: 126/64 127/70  Pulse: 70 75  Resp: 16 17  Temp: 97.7 F (36.5 C) 97.9 F (36.6 C)  SpO2: 94% 96%   There were no vitals filed for this visit.  Intake/Output from previous day: 06/08 0701 - 06/09 0700 In: 1092.7 [P.O.:480; I.V.:612.7] Out: -   GENERAL:alert, no distress and comfortable SKIN: skin color, texture, turgor are normal, no rashes or significant  lesions EYES: normal, Conjunctiva are pink and non-injected, sclera clear OROPHARYNX:no exudate, no erythema and lips, buccal mucosa, and tongue normal  LUNGS: clear to auscultation and percussion with normal breathing effort HEART: regular rate & rhythm and no murmurs and +1 pitting lower extremity edema ABDOMEN:abdomen soft, non-tender and normal bowel  sounds Musculoskeletal:no cyanosis of digits and no clubbing  NEURO: alert & oriented x 3 with fluent speech, no focal motor/sensory deficits  LABORATORY DATA:  I have reviewed the data as listed CMP Latest Ref Rng & Units 02/10/2021 02/09/2021 02/08/2021  Glucose 70 - 99 mg/dL 96 100(H) 126(H)  BUN 8 - 23 mg/dL $Remove'19 21 14  'RQwzdOv$ Creatinine 0.44 - 1.00 mg/dL 0.53 0.66 0.49  Sodium 135 - 145 mmol/L 140 140 139  Potassium 3.5 - 5.1 mmol/L 3.9 3.8 3.6  Chloride 98 - 111 mmol/L 110 111 110  CO2 22 - 32 mmol/L $RemoveB'25 24 24  'gaKwwgFS$ Calcium 8.9 - 10.3 mg/dL 8.3(L) 8.4(L) 8.4(L)  Total Protein 6.5 - 8.1 g/dL 4.8(L) 4.9(L) 5.0(L)  Total Bilirubin 0.3 - 1.2 mg/dL 0.5 0.4 0.5  Alkaline Phos 38 - 126 U/L 57 64 75  AST 15 - 41 U/L 21 14(L) 12(L)  ALT 0 - 44 U/L $Remo'28 22 23    'gAFiv$ Lab Results  Component Value Date   WBC 5.8 02/10/2021   HGB 7.9 (L) 02/10/2021   HCT 24.6 (L) 02/10/2021   MCV 92.8 02/10/2021   PLT 357 02/10/2021   NEUTROABS 4.9 02/10/2021    No results found.  ASSESSMENT AND PLAN: 1) large B-cell lymphoma -CT abdomen/pelvis with contrast 10/21/2020 - "Acute colitis/enteritis of the cecum/ascending colon and the associated terminal loops of ileum. As was recently described on CT imaging, the anatomy of distal ileum, ileocecal valve, cecum is atypical and correlation with patient's prior surgical record would be useful. The acute inflammatory changes contribute to at least a partial small bowel obstruction. Primary concern of this inflammatory cystic mass/bowel of the right lower quadrant is malignancy given that the posterior margin is inseparable from the psoas muscle, adnexa, and the right ureter. Correlation with CEA may be useful. Inflammatory/infectious changes are also a consideration." -Colonoscopy performed 11/04/2020- "An infiltrative and ulcerated partially obstructing large mass was found at 85 cm proximal to the anus. 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  She has no evidence of bleeding.   We will plan for PRBC transfusion if hemoglobin is less than 8.  She is aware that we may need to give her a unit of blood prior to discharge. -Potassium normalized We will continue to monitor daily and administer potassium as needed. -Labs are adequate to proceed with day 4 of cycle #5 of her chemotherapy today as planned. -Will monitor daily CBC with differential and CMET. -Continue as needed antiemetics. -Continue MiraLAX and Senokot-S. -Lovenox for DVT prophylaxis. -  Continue home medications. -Continue PPI and as needed Maalox for reflux symptoms. -Labs, Rituxan, Neulasta 6/13 at [facility].  Labs, visit, possible PRBC transfusion 6/17 and again on 6/21.   LOS: 3 days   Ledell Peoples, MD Department of Hematology/Oncology [place] at [facility] Phone: [phone] Pager: [phone] Email: [name]

## 2021-02-11 ENCOUNTER — Other Ambulatory Visit: Payer: Self-pay

## 2021-02-11 ENCOUNTER — Other Ambulatory Visit: Payer: Self-pay | Admitting: Gastroenterology

## 2021-02-11 ENCOUNTER — Encounter: Payer: Self-pay | Admitting: Hematology and Oncology

## 2021-02-11 ENCOUNTER — Other Ambulatory Visit: Payer: Self-pay | Admitting: Oncology

## 2021-02-11 ENCOUNTER — Inpatient Hospital Stay (HOSPITAL_COMMUNITY): Payer: PPO

## 2021-02-11 DIAGNOSIS — C8510 Unspecified B-cell lymphoma, unspecified site: Secondary | ICD-10-CM

## 2021-02-11 LAB — CBC WITH DIFFERENTIAL/PLATELET
Abs Immature Granulocytes: 0.07 10*3/uL (ref 0.00–0.07)
Basophils Absolute: 0 10*3/uL (ref 0.0–0.1)
Basophils Relative: 0 %
Eosinophils Absolute: 0 10*3/uL (ref 0.0–0.5)
Eosinophils Relative: 0 %
HCT: 27.1 % — ABNORMAL LOW (ref 36.0–46.0)
Hemoglobin: 8.9 g/dL — ABNORMAL LOW (ref 12.0–15.0)
Immature Granulocytes: 1 %
Lymphocytes Relative: 7 %
Lymphs Abs: 0.5 10*3/uL — ABNORMAL LOW (ref 0.7–4.0)
MCH: 29.5 pg (ref 26.0–34.0)
MCHC: 32.8 g/dL (ref 30.0–36.0)
MCV: 89.7 fL (ref 80.0–100.0)
Monocytes Absolute: 0.2 10*3/uL (ref 0.1–1.0)
Monocytes Relative: 2 %
Neutro Abs: 6.9 10*3/uL (ref 1.7–7.7)
Neutrophils Relative %: 90 %
Platelets: 405 10*3/uL — ABNORMAL HIGH (ref 150–400)
RBC: 3.02 MIL/uL — ABNORMAL LOW (ref 3.87–5.11)
RDW: 17 % — ABNORMAL HIGH (ref 11.5–15.5)
WBC: 7.6 10*3/uL (ref 4.0–10.5)
nRBC: 0 % (ref 0.0–0.2)

## 2021-02-11 LAB — COMPREHENSIVE METABOLIC PANEL
ALT: 27 U/L (ref 0–44)
AST: 18 U/L (ref 15–41)
Albumin: 2.6 g/dL — ABNORMAL LOW (ref 3.5–5.0)
Alkaline Phosphatase: 62 U/L (ref 38–126)
Anion gap: 6 (ref 5–15)
BUN: 21 mg/dL (ref 8–23)
CO2: 28 mmol/L (ref 22–32)
Calcium: 8.2 mg/dL — ABNORMAL LOW (ref 8.9–10.3)
Chloride: 103 mmol/L (ref 98–111)
Creatinine, Ser: 0.51 mg/dL (ref 0.44–1.00)
GFR, Estimated: >60 mL/min (ref >60)
Glucose, Bld: 91 mg/dL (ref 70–99)
Potassium: 3.4 mmol/L — ABNORMAL LOW (ref 3.5–5.1)
Sodium: 137 mmol/L (ref 135–145)
Total Bilirubin: 0.7 mg/dL (ref 0.3–1.2)
Total Protein: 4.8 g/dL — ABNORMAL LOW (ref 6.5–8.1)

## 2021-02-11 MED ORDER — SODIUM CHLORIDE (PF) 0.9 % IJ SOLN
INTRAMUSCULAR | Status: AC
  Filled 2021-02-11: qty 50

## 2021-02-11 MED ORDER — SODIUM CHLORIDE 0.9 % IV SOLN: INTRAVENOUS | Status: DC

## 2021-02-11 MED ORDER — HYDROMORPHONE HCL 1 MG/ML IJ SOLN: 1.0000 mg | INTRAMUSCULAR | Status: DC | PRN

## 2021-02-11 MED ORDER — IOHEXOL 300 MG/ML  SOLN
75.0000 mL | Freq: Once | INTRAMUSCULAR | Status: AC | PRN
  Administered 2021-02-11: 75 mL via INTRAVENOUS

## 2021-02-11 MED ORDER — IOHEXOL 9 MG/ML PO SOLN
ORAL | Status: AC
  Administered 2021-02-11: 500 mL
  Filled 2021-02-11: qty 1000

## 2021-02-11 MED ORDER — IOHEXOL 9 MG/ML PO SOLN
500.0000 mL | ORAL | Status: AC
  Administered 2021-02-11 (×2): 500 mL via ORAL

## 2021-02-11 MED ORDER — PROCHLORPERAZINE EDISYLATE 10 MG/2ML IJ SOLN: 10.0000 mg | Freq: Four times a day (QID) | INTRAMUSCULAR | Status: DC | PRN

## 2021-02-11 NOTE — Progress Notes (Signed)
HEMATOLOGY-ONCOLOGY PROGRESS NOTE  SUBJECTIVE: The patient tolerated day 4 of cycle #5 of her chemotherapy well overall, though she continues to have small loose stools and worsening abdominal cramping, particularly this morning.  She does have some mild bilateral swelling in her feet, but no shortness of breath.  She has not had any fevers and denies chills.  Reports she is trying to eat more but reports only being able to eat a small cup of apple sauce today. She continued to ambulate the halls.  She denies mucositis, nausea, vomiting.  Denies bleeding.  A full 10 point ROS is listed below.   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 Biopsy showed invasive and in situ ductal carcinoma, HER-2 equivocal by IHC (2+), negative by FISH (ratio 1.59), ER+ >95%, PR+ 85%, Ki67 10%.    Malignant neoplasm of left breast in female, estrogen receptor positive ([place])  10/12/2020 Initial Diagnosis   Malignant neoplasm of left breast in female, estrogen receptor positive ([place])    10/12/2020 Cancer Staging   Staging form: Breast, AJCC 8th Edition - Clinical stage from 10/12/2020: Stage IA (cT1b, cN0, cM0, G2, ER+, PR+, HER2-) - Signed by Nicholas Lose, MD on 10/15/2020  Stage prefix: Initial diagnosis    11/05/2020 Surgery   Left lumpectomy: Grade 1 IDC, 1.1 cm, intermediate grade DCIS, margins are negative, lymph node -0/1, ER greater than 95%, PR 85%, HER-2 negative, Ki-67 10%   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[place])  11/11/2020 Initial Diagnosis   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[place])     11/15/2020 -  Chemotherapy    Patient is on Treatment Plan: IP NON-HODGKINS LYMPHOMA EPOCH Q21D   Patient is on Antibody Plan: NON-HODGKINS LYMPHOMA RITUXIMAB Q21D     11/19/2020 -  Chemotherapy    Patient is on Treatment Plan: IP NON-HODGKINS LYMPHOMA EPOCH Q21D   Patient is on Antibody Plan: NON-HODGKINS LYMPHOMA RITUXIMAB Q21D        REVIEW OF SYSTEMS:   Constitutional: Denies fevers, chills  Eyes: Denies blurriness of vision Ears, nose, mouth, throat, and face: Denies mucositis or sore throat Respiratory: Denies cough, dyspnea or wheezes Cardiovascular: Denies palpitation, chest discomfort Gastrointestinal:  Denies nausea, heartburn or change in bowel habits Skin: Denies abnormal skin rashes Lymphatics: Denies new lymphadenopathy or easy bruising Neurological: Denies dizziness and headache Behavioral/Psych: Mood is stable, no new changes  Extremities: No lower extremity edema All other systems were reviewed with the patient and are negative.  I have reviewed the past medical history, past surgical history, social history and family history with the patient and they are unchanged from previous note.   PHYSICAL EXAMINATION: ECOG PERFORMANCE STATUS: 1 - Symptomatic but completely ambulatory  Vitals:   02/11/21 1631 02/11/21 2104  BP: 136/78 128/79  Pulse: 73 69  Resp: 17 16  Temp: 97.6 F (36.4 C) 98.2 F (36.8 C)  SpO2: 93% 93%   There were no vitals filed for this visit.  Intake/Output from previous day: 06/09 0701 - 06/10 0700 In: 916.5 [P.O.:120; I.V.:563.6; IV Piggyback:232.9] Out: -  GENERAL:alert, no distress and comfortable SKIN: skin color, texture, turgor are normal, no rashes or significant lesions EYES: normal, Conjunctiva are pink and non-injected, sclera clear OROPHARYNX:no exudate, no erythema and lips, buccal mucosa, and tongue normal  LUNGS: clear to auscultation and percussion with normal breathing effort HEART: regular rate & rhythm and no  murmurs and +1 pitting lower extremity edema ABDOMEN:soft, nontender, but tympanic in epigastric region.  Musculoskeletal:no cyanosis of digits and no clubbing  NEURO: alert & oriented x 3 with fluent speech, no focal motor/sensory deficits  LABORATORY DATA:  I have reviewed the data as listed CMP Latest Ref Rng & Units 02/11/2021 02/10/2021 02/09/2021  Glucose 70 - 99 mg/dL 91 96 100(H)  BUN 8 - 23 mg/dL $Remove'21 19 21  'YlbkMAT$ Creatinine 0.44 - 1.00 mg/dL 0.51 0.53 0.66  Sodium 135 - 145 mmol/L 137 140 140  Potassium 3.5 - 5.1 mmol/L 3.4(L) 3.9 3.8  Chloride 98 - 111 mmol/L 103 110 111  CO2 22 - 32 mmol/L $RemoveB'28 25 24  'qWkaLLgE$ Calcium 8.9 - 10.3 mg/dL 8.2(L) 8.3(L) 8.4(L)  Total Protein 6.5 - 8.1 g/dL 4.8(L) 4.8(L) 4.9(L)  Total Bilirubin 0.3 - 1.2 mg/dL 0.7 0.5 0.4  Alkaline Phos 38 - 126 U/L 62 57 64  AST 15 - 41 U/L 18 21 14(L)  ALT 0 - 44 U/L $Remo'27 28 22    'PyYcI$ Lab Results  Component Value Date   WBC 7.6 02/11/2021   HGB 8.9 (L) 02/11/2021   HCT 27.1 (L) 02/11/2021   MCV 89.7 02/11/2021   PLT 405 (H) 02/11/2021   NEUTROABS 6.9 02/11/2021    CT ABDOMEN PELVIS W CONTRAST  Result Date: 02/11/2021 CLINICAL DATA:  History of large B-cell lymphoma involving the cecum and distal ileum. EXAM: CT ABDOMEN AND PELVIS WITH CONTRAST TECHNIQUE: Multidetector CT imaging of the abdomen and pelvis was performed using the standard protocol following bolus administration of intravenous contrast. CONTRAST:  90mL OMNIPAQUE IOHEXOL 300 MG/ML  SOLN COMPARISON:  CT scan 10/21/2020 FINDINGS: Lower chest: Very small bilateral pleural effusions with minimal overlying atelectasis. No worrisome pulmonary lesions. Probable radiation changes involving the anterior aspect of the right lung and there are surgical changes involving the right breast. The heart is within normal limits in size. No pericardial effusion. Hepatobiliary: No hepatic lesions are identified. Gallbladder is slightly distended no definite gallstones or findings for acute  cholecystitis. No common bile duct dilatation. Pancreas: No mass, inflammation or ductal dilatation. Spleen: Normal size. No focal lesions. Adrenals/Urinary Tract: Adrenal glands and kidneys are unremarkable. The bladder is unremarkable. Stomach/Bowel: The stomach and duodenum are unremarkable. There is a fairly high-grade small-bowel obstruction noted with markedly dilated small bowel loops and air-fluid/air contrast levels throughout. The colon is decompressed. The small-bowel obstruction appears to be at the distal ileum. There is a very irregular masslike area of scarring changes, fibrosis or treated lymphoid tissue in the right lower quadrant. Surrounding adhesions or scarring in this area. The cecum now appears normal. No residual bowel wall thickening. Vascular/Lymphatic: Stable atherosclerotic calcifications involving the aorta and iliac arteries but no aneurysm. No mesenteric or retroperitoneal mass or adenopathy. Reproductive: Surgically absent. Other: Small amount of free abdominal/free pelvic fluid likely related to the patient has small-bowel obstruction. Musculoskeletal: No significant bony findings. IMPRESSION: 1. Fairly high-grade small-bowel obstruction as detailed above. This appears to be due to a very irregular masslike area of scarring change or treated tumor in the right lower quadrant with surrounding fibrosis and adhesions. 2. No residual bowel wall  thickening to suggest persistent lymphoma involving the cecum or terminal ileum. 3. Small amount of free abdominal/free pelvic fluid likely related to the small-bowel obstruction. 4. Very small bilateral pleural effusions with minimal overlying atelectasis. These results will be called to the ordering clinician or representative by the Radiologist Assistant, and communication documented in the PACS or [employer]. Aortic Atherosclerosis (ICD10-I70.0). Electronically Signed   By: Marijo Sanes M.D.   On: 02/11/2021 19:37    ASSESSMENT AND  PLAN: 1) large B-cell lymphoma -CT abdomen/pelvis with contrast 10/21/2020 - "Acute colitis/enteritis of the cecum/ascending colon and the associated terminal loops of ileum. As was recently described on CT imaging, the anatomy of distal ileum, ileocecal valve, cecum is atypical and correlation with patient's prior surgical record would be useful. The acute inflammatory changes contribute to at least a partial small bowel obstruction. Primary concern of this inflammatory cystic mass/bowel of the right lower quadrant is malignancy given that the posterior margin is inseparable from the psoas muscle, adnexa, and the right ureter. Correlation with CEA may be useful. Inflammatory/infectious changes are also a consideration." -Colonoscopy performed 11/04/2020- "An infiltrative and ulcerated partially obstructing large mass was found at 85 cm proximal to the anus. 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 -Plan is for adjuvant radiation therapy followed by adjuvant antiestrogen therapy   3)  iron deficiency anemia -Labs from 11/15/2020-ferritin 34, iron 24, percent saturation 8%, TIBC 308 -Labs from 11/16/2020-vitamin B12 level 241, folate 7.4   4) history of right breast cancer diagnosed in 2011 status post lumpectomy and 5 years of antiestrogen therapy   5) depression/anxiety   6) hyperlipidemia   7) hypertension   8) hypothyroidism   9) allergies   PLAN:  -Labs from 02/11/2021 show a hemoglobin of 8.9 which is likely elevated in the setting of dehydration 2/2 to poor PO intake over the last day.  She has no evidence of bleeding.   We will plan for PRBC transfusion if hemoglobin is less than 8 in future labs.  She is aware that we may need to give her a unit of blood prior to discharge. -Potassium normalized We will continue to monitor daily and administer potassium as needed. -Labs are  adequate to proceed with day 5 of cycle #5 of her chemotherapy today as planned. -Will monitor daily CBC with differential and CMET. -Continue as needed antiemetics. -Continue MiraLAX and Senokot-S. -Lovenox for DVT prophylaxis. -Continue home medications. -Continue PPI and as needed Maalox for reflux symptoms. -Labs, Rituxan, Neulasta 6/13 at [facility].  Labs, visit, possible PRBC transfusion 6/17 and again on 6/21. --d/c was to happen today, however due to symptoms concerning for bowel obstruction a CT abdomen was ordered. Will await the results of this scan prior to d/c. --if obstruction is noted recommend referral to surgery. Likely non-surgical intervention with NPO status and NG tube.    LOS: 4 days   Ledell Peoples, MD Department of Hematology/Oncology [place] at [facility] Phone: [phone] Pager: [phone] Email: [name]

## 2021-02-12 DIAGNOSIS — Z7189 Other specified counseling: Secondary | ICD-10-CM

## 2021-02-12 DIAGNOSIS — D6481 Anemia due to antineoplastic chemotherapy: Secondary | ICD-10-CM

## 2021-02-12 DIAGNOSIS — K56609 Unspecified intestinal obstruction, unspecified as to partial versus complete obstruction: Secondary | ICD-10-CM

## 2021-02-12 LAB — TYPE AND SCREEN
ABO/RH(D): O POS
Antibody Screen: NEGATIVE
Unit division: 0

## 2021-02-12 LAB — BPAM RBC
Blood Product Expiration Date: 202207122359
ISSUE DATE / TIME: 202206101306
Unit Type and Rh: 5100

## 2021-02-12 NOTE — Progress Notes (Signed)
Lindsey Price   DOB:Jun 19, 1946   TK#:[id number]    ASSESSMENT & PLAN:  Partial small bowel obstruction, resolving I am surprised by her rapid clinical improvement We discussed conservative management for partial small bowel obstruction I recommend reintroduce liquid diet only today If she tolerates that, advance to soft diet tomorrow She will likely be here on Monday Continue supportive care She will continue IV fluids today  Diffuse large B-cell lymphoma She is due for rituximab on Monday She would like to get her treatment here  Anemia due to chemotherapy She is not symptomatic Repeat CBC tomorrow  Code Status Full code  Goals of care Until resolution of bowel symptoms  Discharge planning Probably Monday  All questions were answered. The patient knows to call the clinic with any problems, questions or concerns.   The total time spent in the appointment was 30 minutes encounter with patients including review of chart and various tests results, discussions about plan of care and coordination of care plan  Heath Lark, MD 02/12/2021 10:26 AM  Subjective:  The patient is seen on behalf of her primary oncologist She was supposed to go home yesterday after completion of chemotherapy However, she started to develop acute left lower quadrant pain, similar to her prior presentation of bowel obstruction She had urgent CT imaging done yesterday which show partial small bowel obstruction She was kept n.p.o. and started on IV fluids Last night, she started to have bowel movement with multiple loose stool This morning, she felt hungry Her pain went away Her abdominal bloating went away She denies nausea or vomiting  Objective:  Vitals:   02/11/21 2104 02/12/21 0556  BP: 128/79 138/77  Pulse: 69 73  Resp: 16 16  Temp: 98.2 F (36.8 C) 97.9 F (36.6 C)  SpO2: 93% 94%     Intake/Output Summary (Last 24 hours) at 02/12/2021 1026 Last data filed at 02/11/2021 1605 Gross  per 24 hour  Intake 408 ml  Output --  Net 408 ml    GENERAL:alert, no distress and comfortable SKIN: skin color, texture, turgor are normal, no rashes or significant lesions EYES: normal, Conjunctiva are pink and non-injected, sclera clear OROPHARYNX:no exudate, no erythema and lips, buccal mucosa, and tongue normal  NECK: supple, thyroid normal size, non-tender, without nodularity LYMPH:  no palpable lymphadenopathy in the cervical, axillary or inguinal LUNGS: clear to auscultation and percussion with normal breathing effort HEART: regular rate & rhythm and no murmurs and no lower extremity edema ABDOMEN:abdomen soft, non-tender and normal bowel sounds Musculoskeletal:no cyanosis of digits and no clubbing  NEURO: alert & oriented x 3 with fluent speech, no focal motor/sensory deficits   Labs:  Recent Labs    02/09/21 0529 02/10/21 0526 02/11/21 0525  NA 140 140 137  K 3.8 3.9 3.4*  CL 111 110 103  CO2 24 25 28   GLUCOSE 100* 96 91  BUN 21 19 21   CREATININE 0.66 0.53 0.51  CALCIUM 8.4* 8.3* 8.2*  GFRNONAA >60 >60 >60  PROT 4.9* 4.8* 4.8*  ALBUMIN 2.5* 2.5* 2.6*  AST 14* 21 18  ALT 22 28 27   ALKPHOS 64 57 62  BILITOT 0.4 0.5 0.7    Studies: I have personally reviewed her CT imaging CT ABDOMEN PELVIS W CONTRAST  Result Date: 02/11/2021 CLINICAL DATA:  History of large B-cell lymphoma involving the cecum and distal ileum. EXAM: CT ABDOMEN AND PELVIS WITH CONTRAST TECHNIQUE: Multidetector CT imaging of the abdomen and pelvis was performed using the standard  protocol following bolus administration of intravenous contrast. CONTRAST:  49mL OMNIPAQUE IOHEXOL 300 MG/ML  SOLN COMPARISON:  CT scan 10/21/2020 FINDINGS: Lower chest: Very small bilateral pleural effusions with minimal overlying atelectasis. No worrisome pulmonary lesions. Probable radiation changes involving the anterior aspect of the right lung and there are surgical changes involving the right breast. The heart is  within normal limits in size. No pericardial effusion. Hepatobiliary: No hepatic lesions are identified. Gallbladder is slightly distended no definite gallstones or findings for acute cholecystitis. No common bile duct dilatation. Pancreas: No mass, inflammation or ductal dilatation. Spleen: Normal size. No focal lesions. Adrenals/Urinary Tract: Adrenal glands and kidneys are unremarkable. The bladder is unremarkable. Stomach/Bowel: The stomach and duodenum are unremarkable. There is a fairly high-grade small-bowel obstruction noted with markedly dilated small bowel loops and air-fluid/air contrast levels throughout. The colon is decompressed. The small-bowel obstruction appears to be at the distal ileum. There is a very irregular masslike area of scarring changes, fibrosis or treated lymphoid tissue in the right lower quadrant. Surrounding adhesions or scarring in this area. The cecum now appears normal. No residual bowel wall thickening. Vascular/Lymphatic: Stable atherosclerotic calcifications involving the aorta and iliac arteries but no aneurysm. No mesenteric or retroperitoneal mass or adenopathy. Reproductive: Surgically absent. Other: Small amount of free abdominal/free pelvic fluid likely related to the patient has small-bowel obstruction. Musculoskeletal: No significant bony findings. IMPRESSION: 1. Fairly high-grade small-bowel obstruction as detailed above. This appears to be due to a very irregular masslike area of scarring change or treated tumor in the right lower quadrant with surrounding fibrosis and adhesions. 2. No residual bowel wall thickening to suggest persistent lymphoma involving the cecum or terminal ileum. 3. Small amount of free abdominal/free pelvic fluid likely related to the small-bowel obstruction. 4. Very small bilateral pleural effusions with minimal overlying atelectasis. These results will be called to the ordering clinician or representative by the Radiologist Assistant, and  communication documented in the PACS or [employer]. Aortic Atherosclerosis (ICD10-I70.0). Electronically Signed   By: Marijo Sanes M.D.   On: 02/11/2021 19:37

## 2021-02-13 ENCOUNTER — Other Ambulatory Visit: Payer: Self-pay | Admitting: Hematology and Oncology

## 2021-02-13 ENCOUNTER — Inpatient Hospital Stay (HOSPITAL_COMMUNITY): Payer: PPO

## 2021-02-13 NOTE — Progress Notes (Signed)
Lindsey Price   DOB:May 02, 1946   LK#:[id number]    ASSESSMENT & PLAN:   Partial small bowel obstruction, resolving We discussed conservative management for partial small bowel obstruction She has some mild intermittent cramps but no nausea Abdominal X ray showed stability I will cautiously advance to soft diet today She will likely be here on Monday Continue supportive care She will continue IV fluids today   Diffuse large B-cell lymphoma She is due for rituximab on Monday She would like to get her treatment here Orders are signed and inpatient team is informed She can proceed if clinically stable   Anemia due to chemotherapy She is not symptomatic Repeat CBC tomorrow   Code Status Full code   Goals of care Until resolution of bowel symptoms   Discharge planning Probably Monday All questions were answered. The patient knows to call the clinic with any problems, questions or concerns.   The total time spent in the appointment was 30 minutes encounter with patients including review of chart and various tests results, discussions about plan of care and coordination of care plan  Heath Lark, MD 02/13/2021 10:39 AM  Subjective:  She felt better, able to tolerate soft diet but did  complain of intermittent cramps She has loose stool No nausea or vomiting  Objective:  Vitals:   02/12/21 2009 02/13/21 0523  BP: 105/77 122/69  Pulse: 64 77  Resp: 16 16  Temp: 98.2 F (36.8 C) 98.1 F (36.7 C)  SpO2: 95% 97%     Intake/Output Summary (Last 24 hours) at 02/13/2021 1039 Last data filed at 02/13/2021 0324 Gross per 24 hour  Intake 2086.31 ml  Output --  Net 2086.31 ml    GENERAL:alert, no distress and comfortable SKIN: skin color, texture, turgor are normal, no rashes or significant lesions EYES: normal, Conjunctiva are pink and non-injected, sclera clear OROPHARYNX:no exudate, no erythema and lips, buccal mucosa, and tongue normal  NECK: supple, thyroid normal  size, non-tender, without nodularity LYMPH:  no palpable lymphadenopathy in the cervical, axillary or inguinal LUNGS: clear to auscultation and percussion with normal breathing effort HEART: regular rate & rhythm and no murmurs and no lower extremity edema ABDOMEN:abdomen soft, slightly more distended compared to yesterday's exam but non-tender Musculoskeletal:no cyanosis of digits and no clubbing  NEURO: alert & oriented x 3 with fluent speech, no focal motor/sensory deficits   Labs:  Recent Labs    02/09/21 0529 02/10/21 0526 02/11/21 0525  NA 140 140 137  K 3.8 3.9 3.4*  CL 111 110 103  CO2 24 25 28   GLUCOSE 100* 96 91  BUN 21 19 21   CREATININE 0.66 0.53 0.51  CALCIUM 8.4* 8.3* 8.2*  GFRNONAA >60 >60 >60  PROT 4.9* 4.8* 4.8*  ALBUMIN 2.5* 2.5* 2.6*  AST 14* 21 18  ALT 22 28 27   ALKPHOS 64 57 62  BILITOT 0.4 0.5 0.7    Studies: I have reviewed her X-ray CT ABDOMEN PELVIS W CONTRAST  Result Date: 02/11/2021 CLINICAL DATA:  History of large B-cell lymphoma involving the cecum and distal ileum. EXAM: CT ABDOMEN AND PELVIS WITH CONTRAST TECHNIQUE: Multidetector CT imaging of the abdomen and pelvis was performed using the standard protocol following bolus administration of intravenous contrast. CONTRAST:  60mL OMNIPAQUE IOHEXOL 300 MG/ML  SOLN COMPARISON:  CT scan 10/21/2020 FINDINGS: Lower chest: Very small bilateral pleural effusions with minimal overlying atelectasis. No worrisome pulmonary lesions. Probable radiation changes involving the anterior aspect of the right lung and there  are surgical changes involving the right breast. The heart is within normal limits in size. No pericardial effusion. Hepatobiliary: No hepatic lesions are identified. Gallbladder is slightly distended no definite gallstones or findings for acute cholecystitis. No common bile duct dilatation. Pancreas: No mass, inflammation or ductal dilatation. Spleen: Normal size. No focal lesions. Adrenals/Urinary  Tract: Adrenal glands and kidneys are unremarkable. The bladder is unremarkable. Stomach/Bowel: The stomach and duodenum are unremarkable. There is a fairly high-grade small-bowel obstruction noted with markedly dilated small bowel loops and air-fluid/air contrast levels throughout. The colon is decompressed. The small-bowel obstruction appears to be at the distal ileum. There is a very irregular masslike area of scarring changes, fibrosis or treated lymphoid tissue in the right lower quadrant. Surrounding adhesions or scarring in this area. The cecum now appears normal. No residual bowel wall thickening. Vascular/Lymphatic: Stable atherosclerotic calcifications involving the aorta and iliac arteries but no aneurysm. No mesenteric or retroperitoneal mass or adenopathy. Reproductive: Surgically absent. Other: Small amount of free abdominal/free pelvic fluid likely related to the patient has small-bowel obstruction. Musculoskeletal: No significant bony findings. IMPRESSION: 1. Fairly high-grade small-bowel obstruction as detailed above. This appears to be due to a very irregular masslike area of scarring change or treated tumor in the right lower quadrant with surrounding fibrosis and adhesions. 2. No residual bowel wall thickening to suggest persistent lymphoma involving the cecum or terminal ileum. 3. Small amount of free abdominal/free pelvic fluid likely related to the small-bowel obstruction. 4. Very small bilateral pleural effusions with minimal overlying atelectasis. These results will be called to the ordering clinician or representative by the Radiologist Assistant, and communication documented in the PACS or [employer]. Aortic Atherosclerosis (ICD10-I70.0). Electronically Signed   By: Marijo Sanes M.D.   On: 02/11/2021 19:37   DG Abd 2 Views  Result Date: 02/13/2021 CLINICAL DATA:  Small-bowel obstruction on CT EXAM: ABDOMEN - 2 VIEW COMPARISON:  CT of 2 days ago FINDINGS: Supine and upright  views. The upright view demonstrates no free intraperitoneal air. Right hemidiaphragm elevation. Cardiomegaly. No upper abdominal air-fluid levels. The supine view demonstrates normal caliber gas-filled colon. Small bowel loops measure up to 4.0 cm, minimally decreased compared to the prior CT. IMPRESSION: Improvement in small-bowel obstruction pattern. No free intraperitoneal air or other acute complication. Electronically Signed   By: Abigail Miyamoto M.D.   On: 02/13/2021 10:16

## 2021-02-14 ENCOUNTER — Other Ambulatory Visit: Payer: PPO

## 2021-02-14 ENCOUNTER — Telehealth: Payer: Self-pay | Admitting: Physician Assistant

## 2021-02-14 ENCOUNTER — Ambulatory Visit: Payer: PPO

## 2021-02-14 LAB — CBC WITH DIFFERENTIAL/PLATELET
Abs Immature Granulocytes: 0.02 10*3/uL (ref 0.00–0.07)
Basophils Absolute: 0 10*3/uL (ref 0.0–0.1)
Basophils Relative: 1 %
Eosinophils Absolute: 0 10*3/uL (ref 0.0–0.5)
Eosinophils Relative: 2 %
HCT: 28.2 % — ABNORMAL LOW (ref 36.0–46.0)
Hemoglobin: 9 g/dL — ABNORMAL LOW (ref 12.0–15.0)
Immature Granulocytes: 1 %
Lymphocytes Relative: 6 %
Lymphs Abs: 0.2 10*3/uL — ABNORMAL LOW (ref 0.7–4.0)
MCH: 28.5 pg (ref 26.0–34.0)
MCHC: 31.9 g/dL (ref 30.0–36.0)
MCV: 89.2 fL (ref 80.0–100.0)
Monocytes Absolute: 0 10*3/uL — ABNORMAL LOW (ref 0.1–1.0)
Monocytes Relative: 2 %
Neutro Abs: 2.4 10*3/uL (ref 1.7–7.7)
Neutrophils Relative %: 88 %
Platelets: 261 10*3/uL (ref 150–400)
RBC: 3.16 MIL/uL — ABNORMAL LOW (ref 3.87–5.11)
RDW: 17.7 % — ABNORMAL HIGH (ref 11.5–15.5)
WBC: 2.7 10*3/uL — ABNORMAL LOW (ref 4.0–10.5)
nRBC: 0 % (ref 0.0–0.2)

## 2021-02-14 LAB — COMPREHENSIVE METABOLIC PANEL
ALT: 15 U/L (ref 0–44)
AST: 13 U/L — ABNORMAL LOW (ref 15–41)
Albumin: 2.4 g/dL — ABNORMAL LOW (ref 3.5–5.0)
Alkaline Phosphatase: 44 U/L (ref 38–126)
Anion gap: 6 (ref 5–15)
BUN: 9 mg/dL (ref 8–23)
CO2: 28 mmol/L (ref 22–32)
Calcium: 7.8 mg/dL — ABNORMAL LOW (ref 8.9–10.3)
Chloride: 104 mmol/L (ref 98–111)
Creatinine, Ser: 0.44 mg/dL (ref 0.44–1.00)
GFR, Estimated: >60 mL/min (ref >60)
Glucose, Bld: 84 mg/dL (ref 70–99)
Potassium: 2.9 mmol/L — ABNORMAL LOW (ref 3.5–5.1)
Sodium: 138 mmol/L (ref 135–145)
Total Bilirubin: 0.4 mg/dL (ref 0.3–1.2)
Total Protein: 4.4 g/dL — ABNORMAL LOW (ref 6.5–8.1)

## 2021-02-14 MED ORDER — FAMOTIDINE IN NACL 20-0.9 MG/50ML-% IV SOLN: 20.0000 mg | Freq: Once | INTRAVENOUS | Status: DC | PRN

## 2021-02-14 MED ORDER — POTASSIUM CHLORIDE 10 MEQ/100ML IV SOLN
10.0000 meq | INTRAVENOUS | Status: AC
  Administered 2021-02-14 (×3): 10 meq via INTRAVENOUS
  Filled 2021-02-14 (×2): qty 100

## 2021-02-14 MED ORDER — DIPHENHYDRAMINE HCL 50 MG/ML IJ SOLN: 50.0000 mg | Freq: Once | INTRAMUSCULAR | Status: DC | PRN

## 2021-02-14 MED ORDER — ALBUTEROL SULFATE (2.5 MG/3ML) 0.083% IN NEBU: 2.5000 mg | INHALATION_SOLUTION | Freq: Once | RESPIRATORY_TRACT | Status: DC | PRN

## 2021-02-14 MED ORDER — FAMOTIDINE IN NACL 20-0.9 MG/50ML-% IV SOLN
20.0000 mg | Freq: Once | INTRAVENOUS | Status: AC
  Administered 2021-02-14: 20 mg via INTRAVENOUS
  Filled 2021-02-14: qty 50

## 2021-02-14 MED ORDER — EPINEPHRINE 0.3 MG/0.3ML IJ SOAJ
0.3000 mg | Freq: Once | INTRAMUSCULAR | Status: DC | PRN
  Filled 2021-02-14: qty 0.6

## 2021-02-14 MED ORDER — METHYLPREDNISOLONE SODIUM SUCC 125 MG IJ SOLR
60.0000 mg | Freq: Every day | INTRAMUSCULAR | Status: DC
  Administered 2021-02-14 – 2021-02-15 (×2): 60 mg via INTRAVENOUS
  Filled 2021-02-14: qty 2

## 2021-02-14 MED ORDER — DIPHENHYDRAMINE HCL 50 MG PO CAPS
50.0000 mg | ORAL_CAPSULE | Freq: Once | ORAL | Status: AC
  Administered 2021-02-14: 50 mg via ORAL
  Filled 2021-02-14: qty 1

## 2021-02-14 MED ORDER — HEPARIN SOD (PORK) LOCK FLUSH 100 UNIT/ML IV SOLN: 250.0000 [IU] | Freq: Once | INTRAVENOUS | Status: DC | PRN

## 2021-02-14 MED ORDER — SODIUM CHLORIDE 0.9% FLUSH: 3.0000 mL | INTRAVENOUS | Status: DC | PRN

## 2021-02-14 MED ORDER — SODIUM CHLORIDE 0.9 % IV SOLN: Freq: Once | INTRAVENOUS | Status: AC

## 2021-02-14 MED ORDER — ACETAMINOPHEN 325 MG PO TABS
650.0000 mg | ORAL_TABLET | Freq: Once | ORAL | Status: AC
  Administered 2021-02-14: 650 mg via ORAL
  Filled 2021-02-14: qty 2

## 2021-02-14 MED ORDER — HEPARIN SOD (PORK) LOCK FLUSH 100 UNIT/ML IV SOLN
500.0000 [IU] | Freq: Once | INTRAVENOUS | Status: DC | PRN
  Filled 2021-02-14: qty 5

## 2021-02-14 MED ORDER — METHYLPREDNISOLONE SODIUM SUCC 125 MG IJ SOLR
125.0000 mg | Freq: Once | INTRAMUSCULAR | Status: DC | PRN
  Filled 2021-02-14: qty 2

## 2021-02-14 MED ORDER — SODIUM CHLORIDE 0.9 % IV SOLN
375.0000 mg/m2 | Freq: Once | INTRAVENOUS | Status: AC
  Administered 2021-02-14: 600 mg via INTRAVENOUS
  Filled 2021-02-14: qty 50

## 2021-02-14 MED ORDER — SODIUM CHLORIDE 0.9 % IV SOLN: Freq: Once | INTRAVENOUS | Status: DC | PRN

## 2021-02-14 MED ORDER — SODIUM CHLORIDE 0.9% FLUSH: 10.0000 mL | INTRAVENOUS | Status: DC | PRN

## 2021-02-14 MED ORDER — POTASSIUM CHLORIDE IN NACL 20-0.9 MEQ/L-% IV SOLN
INTRAVENOUS | Status: DC
Start: 2021-02-14 — End: 2021-02-15
  Filled 2021-02-14 (×2): qty 1000

## 2021-02-14 MED ORDER — ALTEPLASE 2 MG IJ SOLR
2.0000 mg | Freq: Once | INTRAMUSCULAR | Status: DC | PRN
  Filled 2021-02-14: qty 2

## 2021-02-14 NOTE — Telephone Encounter (Signed)
Scheduled appointment per 06/13 sch msg. Left message.

## 2021-02-14 NOTE — Progress Notes (Signed)
HEMATOLOGY-ONCOLOGY PROGRESS NOTE  SUBJECTIVE: The patient reports improvement in her abdominal pain this morning.  Still has some discomfort in her right upper quadrant.  Reports diarrhea is having 3-4 loose stools per day.  She denies nausea and vomiting.  Denies fevers and chills.  Denies mucositis.  No chest pain, shortness of breath, cough.  She is tolerating a regular diet.     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 Biopsy showed invasive and in situ ductal carcinoma, HER-2 equivocal by IHC (2+), negative by FISH (ratio 1.59), ER+ >95%, PR+ 85%, Ki67 10%.    Malignant neoplasm of left breast in female, estrogen receptor positive ([place])  10/12/2020 Initial Diagnosis   Malignant neoplasm of left breast in female, estrogen receptor positive ([place])    10/12/2020 Cancer Staging   Staging form: Breast, AJCC 8th Edition - Clinical stage from 10/12/2020: Stage IA (cT1b, cN0, cM0, G2, ER+, PR+, HER2-) - Signed by Nicholas Lose, MD on 10/15/2020  Stage prefix: Initial diagnosis    11/05/2020 Surgery   Left lumpectomy: Grade 1 IDC, 1.1 cm, intermediate grade DCIS, margins are negative, lymph node -0/1, ER greater than 95%, PR 85%, HER-2 negative, Ki-67 10%   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[place])  11/11/2020 Initial Diagnosis   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[place])    11/15/2020 -  Chemotherapy    Patient is on Treatment Plan: IP NON-HODGKINS LYMPHOMA EPOCH Q21D   Patient is on Antibody Plan: NON-HODGKINS LYMPHOMA RITUXIMAB Q21D     11/19/2020 -  Chemotherapy    Patient is on  Treatment Plan: IP NON-HODGKINS LYMPHOMA EPOCH Q21D   Patient is on Antibody Plan: NON-HODGKINS LYMPHOMA RITUXIMAB Q21D        REVIEW OF SYSTEMS:   Constitutional: Denies fevers, chills  Eyes: Denies blurriness of vision Ears, nose, mouth, throat, and face: Denies mucositis or sore throat Respiratory: Denies cough, dyspnea or wheezes Cardiovascular: Denies palpitation, chest discomfort Gastrointestinal: Reports improvement in her abdominal pain and abdominal distention, reports diarrhea, denies nausea and vomiting Skin: Denies abnormal skin rashes Lymphatics: Denies new lymphadenopathy or easy bruising Neurological: Denies dizziness and headache Behavioral/Psych: Mood is stable, no new changes  Extremities: No lower extremity edema All other systems were reviewed with the patient and are negative.  I have reviewed the past medical history, past surgical history, social history and family history with the patient and they are unchanged from previous note.   PHYSICAL EXAMINATION: ECOG PERFORMANCE STATUS: 1 - Symptomatic but completely ambulatory  Vitals:   02/13/21 2054 02/14/21 0520  BP: 128/73 125/70  Pulse: 77 79  Resp: 15 16  Temp: 98.2 F (36.8 C) 98.4 F (36.9 C)  SpO2: 94% 93%   There were no vitals filed for this visit.  Intake/Output from previous day: 06/12 0701 - 06/13 0700 In: 238 [P.O.:238] Out: -   GENERAL:alert, no distress and comfortable SKIN: skin color, texture, turgor are normal, no rashes or significant lesions EYES: normal, Conjunctiva are pink and non-injected, sclera clear OROPHARYNX:no exudate, no erythema and lips, buccal mucosa, and  tongue normal  LUNGS: clear to auscultation and percussion with normal breathing effort HEART: regular rate & rhythm and no murmurs and +1 pitting lower extremity edema ABDOMEN: Hypoactive bowel sounds, mild tenderness over right upper quadrant, mildly distended Musculoskeletal:no cyanosis of digits and no  clubbing  NEURO: alert & oriented x 3 with fluent speech, no focal motor/sensory deficits  LABORATORY DATA:  I have reviewed the data as listed CMP Latest Ref Rng & Units 02/14/2021 02/11/2021 02/10/2021  Glucose 70 - 99 mg/dL 84 91 96  BUN 8 - 23 mg/dL $Remove'9 21 19  'pppgyck$ Creatinine 0.44 - 1.00 mg/dL 0.44 0.51 0.53  Sodium 135 - 145 mmol/L 138 137 140  Potassium 3.5 - 5.1 mmol/L 2.9(L) 3.4(L) 3.9  Chloride 98 - 111 mmol/L 104 103 110  CO2 22 - 32 mmol/L $RemoveB'28 28 25  'bfkHksmZ$ Calcium 8.9 - 10.3 mg/dL 7.8(L) 8.2(L) 8.3(L)  Total Protein 6.5 - 8.1 g/dL 4.4(L) 4.8(L) 4.8(L)  Total Bilirubin 0.3 - 1.2 mg/dL 0.4 0.7 0.5  Alkaline Phos 38 - 126 U/L 44 62 57  AST 15 - 41 U/L 13(L) 18 21  ALT 0 - 44 U/L $Remo'15 27 28    'kDEkl$ Lab Results  Component Value Date   WBC 2.7 (L) 02/14/2021   HGB 9.0 (L) 02/14/2021   HCT 28.2 (L) 02/14/2021   MCV 89.2 02/14/2021   PLT 261 02/14/2021   NEUTROABS 2.4 02/14/2021    CT ABDOMEN PELVIS W CONTRAST  Result Date: 02/11/2021 CLINICAL DATA:  History of large B-cell lymphoma involving the cecum and distal ileum. EXAM: CT ABDOMEN AND PELVIS WITH CONTRAST TECHNIQUE: Multidetector CT imaging of the abdomen and pelvis was performed using the standard protocol following bolus administration of intravenous contrast. CONTRAST:  28mL OMNIPAQUE IOHEXOL 300 MG/ML  SOLN COMPARISON:  CT scan 10/21/2020 FINDINGS: Lower chest: Very small bilateral pleural effusions with minimal overlying atelectasis. No worrisome pulmonary lesions. Probable radiation changes involving the anterior aspect of the right lung and there are surgical changes involving the right breast. The heart is within normal limits in size. No pericardial effusion. Hepatobiliary: No hepatic lesions are identified. Gallbladder is slightly distended no definite gallstones or findings for acute cholecystitis. No common bile duct dilatation. Pancreas: No mass, inflammation or ductal dilatation. Spleen: Normal size. No focal lesions. Adrenals/Urinary  Tract: Adrenal glands and kidneys are unremarkable. The bladder is unremarkable. Stomach/Bowel: The stomach and duodenum are unremarkable. There is a fairly high-grade small-bowel obstruction noted with markedly dilated small bowel loops and air-fluid/air contrast levels throughout. The colon is decompressed. The small-bowel obstruction appears to be at the distal ileum. There is a very irregular masslike area of scarring changes, fibrosis or treated lymphoid tissue in the right lower quadrant. Surrounding adhesions or scarring in this area. The cecum now appears normal. No residual bowel wall thickening. Vascular/Lymphatic: Stable atherosclerotic calcifications involving the aorta and iliac arteries but no aneurysm. No mesenteric or retroperitoneal mass or adenopathy. Reproductive: Surgically absent. Other: Small amount of free abdominal/free pelvic fluid likely related to the patient has small-bowel obstruction. Musculoskeletal: No significant bony findings. IMPRESSION: 1. Fairly high-grade small-bowel obstruction as detailed above. This appears to be due to a very irregular masslike area of scarring change or treated tumor in the right lower quadrant with surrounding fibrosis and adhesions. 2. No residual bowel wall thickening to suggest persistent lymphoma involving the cecum or terminal ileum. 3. Small amount of free abdominal/free pelvic fluid likely related to the small-bowel obstruction. 4. Very small bilateral pleural effusions  with minimal overlying atelectasis. These results will be called to the ordering clinician or representative by the Radiologist Assistant, and communication documented in the PACS or [employer]. Aortic Atherosclerosis (ICD10-I70.0). Electronically Signed   By: Marijo Sanes M.D.   On: 02/11/2021 19:37   DG Abd 2 Views  Result Date: 02/13/2021 CLINICAL DATA:  Small-bowel obstruction on CT EXAM: ABDOMEN - 2 VIEW COMPARISON:  CT of 2 days ago FINDINGS: Supine and upright  views. The upright view demonstrates no free intraperitoneal air. Right hemidiaphragm elevation. Cardiomegaly. No upper abdominal air-fluid levels. The supine view demonstrates normal caliber gas-filled colon. Small bowel loops measure up to 4.0 cm, minimally decreased compared to the prior CT. IMPRESSION: Improvement in small-bowel obstruction pattern. No free intraperitoneal air or other acute complication. Electronically Signed   By: Abigail Miyamoto M.D.   On: 02/13/2021 10:16    ASSESSMENT AND PLAN: 1) large B-cell lymphoma -CT abdomen/pelvis with contrast 10/21/2020 - "Acute colitis/enteritis of the cecum/ascending colon and the associated terminal loops of ileum. As was recently described on CT imaging, the anatomy of distal ileum, ileocecal valve, cecum is atypical and correlation with patient's prior surgical record would be useful. The acute inflammatory changes contribute to at least a partial small bowel obstruction. Primary concern of this inflammatory cystic mass/bowel of the right lower quadrant is malignancy given that the posterior margin is inseparable from the psoas muscle, adnexa, and the right ureter. Correlation with CEA may be useful. Inflammatory/infectious changes are also a consideration." -Colonoscopy performed 11/04/2020- "An infiltrative and ulcerated partially obstructing large mass was found at 85 cm proximal to the anus. 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  Total white count is low at 2.7 but ANC remains normal.  Hemoglobin stable at 9.0.  Platelets normal.  Potassium down to 2.9 this morning.   -Counts adequate to proceed with Rituxan today. -We will plan to reschedule Udenyca this week the day following discharge. -3 potassium runs today.  Will begin normal saline with 20 mEq of potassium later today once Rituxan and potassium runs completed.  Recheck potassium in the morning. -Continue as needed antiemetics. -Lovenox for DVT prophylaxis. -Continue home medications. -Continue PPI and as needed Maalox for reflux symptoms. -We will reschedule Udenyca injection for Wednesday of this week.  Labs, visit, possible PRBC transfusion 6/17 and again on 6/21.  -Possible discharge in the morning if potassium normalizes and GI symptoms improved.  Mikey Bussing, DNP, AGPCNP-BC, AOCNP   LOS: 7 days

## 2021-02-15 LAB — BASIC METABOLIC PANEL
Anion gap: 5 (ref 5–15)
BUN: 10 mg/dL (ref 8–23)
CO2: 28 mmol/L (ref 22–32)
Calcium: 8.5 mg/dL — ABNORMAL LOW (ref 8.9–10.3)
Chloride: 107 mmol/L (ref 98–111)
Creatinine, Ser: 0.49 mg/dL (ref 0.44–1.00)
GFR, Estimated: >60 mL/min (ref >60)
Glucose, Bld: 87 mg/dL (ref 70–99)
Potassium: 3.6 mmol/L (ref 3.5–5.1)
Sodium: 140 mmol/L (ref 135–145)

## 2021-02-15 MED ORDER — SODIUM BICARBONATE/SODIUM CHLORIDE MOUTHWASH: 1.0000 "application " | OROMUCOSAL | Status: DC

## 2021-02-15 MED ORDER — VENLAFAXINE HCL ER 37.5 MG PO CP24: 37.5000 mg | ORAL_CAPSULE | Freq: Every day | ORAL | Status: DC

## 2021-02-15 MED ORDER — HEPARIN SOD (PORK) LOCK FLUSH 100 UNIT/ML IV SOLN
500.0000 [IU] | Freq: Once | INTRAVENOUS | Status: AC
  Administered 2021-02-15: 500 [IU] via INTRAVENOUS

## 2021-02-15 MED ORDER — EZETIMIBE 10 MG PO TABS: 10.0000 mg | ORAL_TABLET | Freq: Every day | ORAL | Status: AC

## 2021-02-15 MED ORDER — LORAZEPAM 0.5 MG PO TABS: 0.5000 mg | ORAL_TABLET | ORAL | Status: DC

## 2021-02-15 MED ORDER — VITAMIN D (ERGOCALCIFEROL) 1.25 MG (50000 UNIT) PO CAPS: 50000.0000 [IU] | ORAL_CAPSULE | ORAL | Status: AC

## 2021-02-15 MED ORDER — MONTELUKAST SODIUM 10 MG PO TABS: 10.0000 mg | ORAL_TABLET | Freq: Every day | ORAL | Status: DC

## 2021-02-15 MED ORDER — LIDOCAINE-PRILOCAINE 2.5-2.5 % EX CREA: 1.0000 "application " | TOPICAL_CREAM | CUTANEOUS | 0 refills | Status: DC | PRN

## 2021-02-15 MED ORDER — FLUTICASONE PROPIONATE 50 MCG/ACT NA SUSP: 2.0000 | Freq: Every day | NASAL | Status: DC

## 2021-02-15 MED ORDER — PROCHLORPERAZINE MALEATE 10 MG PO TABS: 10.0000 mg | ORAL_TABLET | Freq: Four times a day (QID) | ORAL | Status: DC | PRN

## 2021-02-15 MED ORDER — ONDANSETRON HCL 8 MG PO TABS: 8.0000 mg | ORAL_TABLET | ORAL | Status: DC

## 2021-02-15 MED ORDER — DEXAMETHASONE 4 MG PO TABS: 8.0000 mg | ORAL_TABLET | ORAL | Status: DC

## 2021-02-15 NOTE — Discharge Summary (Signed)
Discharge Summary  Patient ID: Lindsey Price MRN: [id number] DOB/AGE: October 11, 1945 75 y.o.  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[place])   Encounter for antineoplastic chemotherapy  Discharged Condition: good  Discharge Labs:   CBC    Component Value Date/Time   WBC 2.7 (L) 02/14/2021 0521   RBC 3.16 (L) 02/14/2021 0521   HGB 9.0 (L) 02/14/2021 0521   HGB 9.9 (L) 02/02/2021 1156   HGB 13.7 02/06/2019 0908   HGB 14.6 05/15/2014 0908   HCT 28.2 (L) 02/14/2021 0521   HCT 41.3 02/06/2019 0908   HCT 44.9 05/15/2014 0908   PLT 261 02/14/2021 0521   PLT 248 02/02/2021 1156   PLT 322 02/06/2019 0908   MCV 89.2 02/14/2021 0521   MCV 91 02/06/2019 0908   MCV 95.6 05/15/2014 0908   MCH 28.5 02/14/2021 0521   MCHC 31.9 02/14/2021 0521   RDW 17.7 (H) 02/14/2021 0521   RDW 13.1 02/06/2019 0908   RDW 12.7 05/15/2014 0908   LYMPHSABS 0.2 (L) 02/14/2021 0521   LYMPHSABS 1.3 02/06/2019 0908   LYMPHSABS 1.5 05/15/2014 0908   MONOABS 0.0 (L) 02/14/2021 0521   MONOABS 0.5 05/15/2014 0908   EOSABS 0.0 02/14/2021 0521   EOSABS 0.2 02/06/2019 0908   EOSABS 0.1 05/27/2010 1030   BASOSABS 0.0 02/14/2021 0521   BASOSABS 0.0 02/06/2019 0908   BASOSABS 0.0 05/15/2014 0908   CMP Latest Ref Rng & Units 02/15/2021 02/14/2021 02/11/2021  Glucose 70 - 99 mg/dL 87 84 91  BUN 8 - 23 mg/dL $Remove'10 9 21  'TzhltcV$ Creatinine 0.44 - 1.00 mg/dL 0.49 0.44 0.51  Sodium 135 - 145 mmol/L 140 138 137  Potassium 3.5 - 5.1 mmol/L 3.6 2.9(L) 3.4(L)  Chloride 98 - 111 mmol/L 107 104 103  CO2 22 - 32 mmol/L $RemoveB'28 28 28  'gUTJTSdV$ Calcium 8.9 - 10.3 mg/dL 8.5(L) 7.8(L) 8.2(L)  Total Protein 6.5 - 8.1 g/dL - 4.4(L) 4.8(L)  Total Bilirubin 0.3 - 1.2 mg/dL - 0.4 0.7  Alkaline Phos 38 - 126 U/L - 44 62  AST 15 - 41 U/L - 13(L) 18  ALT 0 - 44 U/L - 15 27    Significant Diagnostic Studies:  1) 02/11/2021 -CT abdomen/pelvis with contrast -showed a fairly  high-grade small bowel obstruction which appears to be due to a very irregular masslike area of scarring change or treated tumor in the right lower quadrant with surrounding fibrosis and adhesions, no residual bowel wall thickening to suggest persistent lymphoma involving the cecum or terminal ileum, small amount of free abdominal/free pelvic fluid likely related to the small bowel obstruction, small bilateral pleural effusions with minimal overlying atelectasis.  2) 02/13/2021 -abdominal x-ray -showed improvement in small bowel obstruction pattern, no free intraperitoneal air or other acute complication.  Consults: None  Procedures:  1) 02/11/2021 -1 unit PRBCs  Disposition:  Discharge disposition: 01-Home or Self Care     Allergies as of 02/15/2021       Reactions   Advair Diskus [fluticasone-salmeterol] Other (See Comments)   Hoarseness   Neosporin [neomycin-polymyxin-gramicidin] Itching        Medication List     TAKE these medications    albuterol 108 (90 Base) MCG/ACT inhaler Commonly known as: ProAir HFA Inhale 1-2 puffs into the lungs every 6 (six) hours as needed for wheezing or shortness of breath.   alum & mag hydroxide-simeth 200-200-20 MG/5ML suspension Commonly known as: MAALOX/MYLANTA Take 30 mLs  by mouth every 4 (four) hours as needed for indigestion.   atorvastatin 10 MG tablet Commonly known as: LIPITOR Take 1 tablet (10 mg total) by mouth at bedtime.   B COMPLEX PO Take 1 tablet by mouth daily with breakfast.   calcium carbonate 1250 (500 Ca) MG tablet Commonly known as: OS-CAL - dosed in mg of elemental calcium Take 1,250 mg by mouth daily.   cyanocobalamin 1000 MCG/ML injection Commonly known as: (VITAMIN B-12) Inject 1 mL (1,000 mcg total) into the skin every 30 (thirty) days.   dexamethasone 4 MG tablet Commonly known as: DECADRON Take 2 tablets (8 mg total) by mouth See admin instructions. Take 8 mg by mouth two times a day with a meal  starting the day after chemotherapy- for 3 days   ezetimibe 10 MG tablet Commonly known as: Zetia Take 1 tablet (10 mg total) by mouth at bedtime.   famotidine 20 MG tablet Commonly known as: PEPCID TAKE 1 TABLET (20 MG TOTAL) BY MOUTH 2 (TWO) TIMES DAILY AS NEEDED FOR HEARTBURN OR INDIGESTION.   feeding supplement Liqd Take 237 mLs by mouth 2 (two) times daily between meals.   Flovent HFA 44 MCG/ACT inhaler Generic drug: fluticasone Inhale 2 puffs into the lungs 2 (two) times daily.   fluticasone 50 MCG/ACT nasal spray Commonly known as: FLONASE Place 2 sprays into both nostrils daily.   HYDROcodone-acetaminophen 5-325 MG tablet Commonly known as: NORCO/VICODIN Take 1-2 tablets by mouth every 6 (six) hours as needed for moderate pain or severe pain.   levocetirizine 5 MG tablet Commonly known as: XYZAL Take 5 mg by mouth daily.   levothyroxine 88 MCG tablet Commonly known as: SYNTHROID * What changed: Another medication with the same name was removed. Continue taking this medication, and follow the directions you see here.   levothyroxine 100 MCG tablet Commonly known as: SYNTHROID TAKE 1 TABLET EVERY OTHER DAY ALTERNATING WITH 88 MCG TABLET What changed: Another medication with the same name was removed. Continue taking this medication, and follow the directions you see here.   lidocaine-prilocaine cream Commonly known as: EMLA Apply 1 application topically as needed (as directed).   LORazepam 0.5 MG tablet Commonly known as: Ativan Take 1 tablet (0.5 mg total) by mouth See admin instructions. Take 0.5 mg by mouth at bedtime nightly while hospitalized   magic mouthwash w/lidocaine Soln Take 5 mLs by mouth 4 (four) times daily as needed for mouth pain. Swish and swallow 1 part diphenhydramine 12.5 mg per 5 mL elixir (54ml), 1 part Maalox (do not substitute Kaopectate) 35ml, 1 part 2% viscous lidocaine (40 ml), 1 part nystatin 500000 units/ml (34ml) and 1 part  distilled water (52ml). Plz dispense 200 ml   mirtazapine 15 MG tablet Commonly known as: REMERON Take 1 tablet (15 mg total) by mouth at bedtime.   montelukast 10 MG tablet Commonly known as: SINGULAIR Take 1 tablet (10 mg total) by mouth daily.   multivitamin with minerals Tabs tablet Take 1 tablet by mouth daily.   NexIUM 20 MG capsule Generic drug: esomeprazole Take 40 mg by mouth 2 (two) times daily before a meal. What changed: Another medication with the same name was removed. Continue taking this medication, and follow the directions you see here.   ondansetron 8 MG tablet Commonly known as: Zofran Take 1 tablet (8 mg total) by mouth See admin instructions. Take 8 mg by mouth two times a day starting the day after chemo for- 3 days, then, as  needed, two times a day as needed for nausea or vomiting   polyethylene glycol 17 g packet Commonly known as: MIRALAX / GLYCOLAX Take 17 g by mouth daily.   prochlorperazine 10 MG tablet Commonly known as: COMPAZINE Take 1 tablet (10 mg total) by mouth every 6 (six) hours as needed for vomiting or nausea.   prochlorperazine 25 MG suppository Commonly known as: COMPAZINE Place 1 suppository (25 mg total) rectally every 12 (twelve) hours as needed for nausea.   senna-docusate 8.6-50 MG tablet Commonly known as: Senokot-S Take 1 tablet by mouth 2 (two) times daily.   sodium bicarbonate/sodium chloride Soln 1 application by Mouth Rinse route See admin instructions. Use as directed three to four times daily   venlafaxine XR 37.5 MG 24 hr capsule Commonly known as: EFFEXOR-XR Take 1 capsule (37.5 mg total) by mouth daily with breakfast.   Vitamin D (Ergocalciferol) 1.25 MG (50000 UNIT) Caps capsule Commonly known as: DRISDOL Take 1 capsule (50,000 Units total) by mouth every Friday.          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  The patient has been being followed by Dr. Lindi Adie in our office for her breast cancer.  The patient had a CT of the abdomen/pelvis with contrast due to abdominal pain performed on 10/21/2020 which showed acute colitis/enteritis of the cecum/ascending colon and the associated terminal loops of ileum, acute inflammatory changes contribute to at least a partial small bowel obstruction, primary concern of this inflammatory cystic mass/bowel of the right lower quadrant is malignancy given that the posterior margin is inseparable from the psoas muscle, adnexa, and right ureter.  A colonoscopy was performed on 11/04/2020 which showed an infiltrative and ulcerated partially obstructing large mass was found at 85 cm proximal to the anus. The mass was circumferential.  This mass was biopsied and was consistent with large B-cell lymphoma.  Due to this new finding, the patient was referred to Dr. Irene Limbo for consideration of systemic chemotherapy for treatment of her B-cell lymphoma.  Hospital Course: The patient was admitted on 02/07/2021 to begin cycle #5 of EPOCH-R.  On the day of admission, she was feeling well, but had reported having abdominal discomfort, nausea, vomiting, diarrhea for several days prior to admission.  The symptoms had all resolved on the day of admission.Marland Kitchen  She was not having any fevers or chills.  Labs in the day of admission were adequate for treatment.  She started day 1 of cycle #5 of her chemotherapy as planned on 02/07/2021.  She tolerated chemotherapy well this admission.  The patient's hemoglobin dropped to 7.9 and she received 1 unit of PRBCs on  02/11/2021 with improvement of her hemoglobin.  Discharge was planned for 02/11/2021, but on the day of discharge she reported worsening abdominal cramping and small loose stools.  She was not able to eat very well with the exception of a small amount of applesauce.  A CT of the abdomen/pelvis was obtained on 6/10 which showed a high-grade small bowel obstruction.  Therefore, she was not discharged as planned on 6/10.  The patient was made n.p.o. and then her diet was  slowly advanced which she tolerated well.  She remained in the hospital on 6/13 and received her rituximab on this date.  She had hypokalemia with a potassium of 2.9 on 6/13 and received 3 potassium runs.  Potassium improved to 3.6 on 6/14.  The patient was seen on the morning of 6/14 and was feeling much better.  She denies having abdominal cramping and diarrhea has resolved.  She is tolerating a regular diet.  Her abdominal distention has resolved and she had more bowel sounds on the morning of 6/14.  The patient would like to be discharged home and she is deemed stable for discharge.  The patient will follow up with the cancer center on 6/15 for a Udenyca injection.  She has a lab, follow-up, and possible PRBC transfusion scheduled for 6/17 and again on 6/21.   Discharge Instructions     Informed Consent Details: Physician/Practitioner Attestation; Transcribe to consent form and obtain patient signature   Complete by: Feb 11, 2021    Physician/Practitioner attestation of informed consent for blood and or blood product transfusion: I, the physician/practitioner, attest that I have discussed with the patient the benefits, risks, side effects, alternatives, likelihood of achieving goals and potential problems during recovery for the procedure that I have provided informed consent.   Product(s): All Product(s)   Care order/instruction   Complete by: As directed    Transfuse Parameters   Increase activity slowly   Complete by: As  directed    Type and screen   Complete by: As directed    [facility]       Signed: Mikey Bussing 02/15/2021, 8:31 AM

## 2021-02-15 NOTE — Care Management Important Message (Signed)
Important Message  Patient Details IM Letter given to the Patient. Name: Lindsey Price MRN: [id number] Date of Birth: 1946-04-07   Medicare Important Message Given:  Yes     Kerin Salen 02/15/2021, 9:40 AM

## 2021-02-15 NOTE — Progress Notes (Signed)
Nursing Discharge Summary  Patient ID: Lindsey Price MRN: [id number] DOB/AGE: 1946-03-18 75 y.o.  Admit date: 02/07/2021 Discharge date: 6/14/20222  Discharged Condition: good  Disposition: Discharge disposition: 01-Home or Self Care         Prescriptions Given: Prescriptions called into the pharmacy in [place], [place].  Means of Discharge: Patient to be taken via private vehicle home with husband.  Patient stated Quentin Cornwall, NP reviewed all of her medications with her.  I reviewed schedule appointments and patient verbalized understanding.  Signed: Buel Ream 02/15/2021, 9:50 AM

## 2021-02-16 ENCOUNTER — Other Ambulatory Visit: Payer: Self-pay

## 2021-02-16 ENCOUNTER — Inpatient Hospital Stay: Payer: PPO

## 2021-02-16 VITALS — BP 130/62 | HR 82 | Temp 98.4°F | Resp 20

## 2021-02-16 DIAGNOSIS — G4700 Insomnia, unspecified: Secondary | ICD-10-CM | POA: Diagnosis not present

## 2021-02-16 DIAGNOSIS — E782 Mixed hyperlipidemia: Secondary | ICD-10-CM | POA: Diagnosis not present

## 2021-02-16 DIAGNOSIS — K219 Gastro-esophageal reflux disease without esophagitis: Secondary | ICD-10-CM | POA: Diagnosis not present

## 2021-02-16 DIAGNOSIS — D0512 Intraductal carcinoma in situ of left breast: Secondary | ICD-10-CM | POA: Diagnosis not present

## 2021-02-16 DIAGNOSIS — K1230 Oral mucositis (ulcerative), unspecified: Secondary | ICD-10-CM | POA: Diagnosis not present

## 2021-02-16 DIAGNOSIS — C8339 Diffuse large B-cell lymphoma, extranodal and solid organ sites: Secondary | ICD-10-CM | POA: Diagnosis not present

## 2021-02-16 DIAGNOSIS — C8510 Unspecified B-cell lymphoma, unspecified site: Secondary | ICD-10-CM | POA: Diagnosis not present

## 2021-02-16 DIAGNOSIS — E876 Hypokalemia: Secondary | ICD-10-CM | POA: Diagnosis not present

## 2021-02-16 DIAGNOSIS — T451X5A Adverse effect of antineoplastic and immunosuppressive drugs, initial encounter: Secondary | ICD-10-CM | POA: Diagnosis not present

## 2021-02-16 DIAGNOSIS — I10 Essential (primary) hypertension: Secondary | ICD-10-CM | POA: Diagnosis not present

## 2021-02-16 DIAGNOSIS — C8590 Non-Hodgkin lymphoma, unspecified, unspecified site: Secondary | ICD-10-CM | POA: Diagnosis not present

## 2021-02-16 DIAGNOSIS — Z87891 Personal history of nicotine dependence: Secondary | ICD-10-CM | POA: Diagnosis not present

## 2021-02-16 DIAGNOSIS — D709 Neutropenia, unspecified: Secondary | ICD-10-CM | POA: Diagnosis not present

## 2021-02-16 DIAGNOSIS — Z853 Personal history of malignant neoplasm of breast: Secondary | ICD-10-CM | POA: Diagnosis not present

## 2021-02-16 DIAGNOSIS — B370 Candidal stomatitis: Secondary | ICD-10-CM | POA: Diagnosis not present

## 2021-02-16 DIAGNOSIS — R5081 Fever presenting with conditions classified elsewhere: Secondary | ICD-10-CM | POA: Diagnosis not present

## 2021-02-16 DIAGNOSIS — E039 Hypothyroidism, unspecified: Secondary | ICD-10-CM | POA: Diagnosis not present

## 2021-02-16 DIAGNOSIS — J45909 Unspecified asthma, uncomplicated: Secondary | ICD-10-CM | POA: Diagnosis not present

## 2021-02-16 DIAGNOSIS — R509 Fever, unspecified: Secondary | ICD-10-CM | POA: Diagnosis present

## 2021-02-16 DIAGNOSIS — Z170 Estrogen receptor positive status [ER+]: Secondary | ICD-10-CM | POA: Diagnosis not present

## 2021-02-16 DIAGNOSIS — D701 Agranulocytosis secondary to cancer chemotherapy: Secondary | ICD-10-CM | POA: Diagnosis not present

## 2021-02-16 DIAGNOSIS — C8519 Unspecified B-cell lymphoma, extranodal and solid organ sites: Secondary | ICD-10-CM | POA: Diagnosis not present

## 2021-02-16 DIAGNOSIS — F419 Anxiety disorder, unspecified: Secondary | ICD-10-CM | POA: Diagnosis not present

## 2021-02-16 DIAGNOSIS — E538 Deficiency of other specified B group vitamins: Secondary | ICD-10-CM | POA: Diagnosis not present

## 2021-02-16 DIAGNOSIS — F32A Depression, unspecified: Secondary | ICD-10-CM | POA: Diagnosis not present

## 2021-02-16 DIAGNOSIS — Z20822 Contact with and (suspected) exposure to covid-19: Secondary | ICD-10-CM | POA: Diagnosis not present

## 2021-02-16 DIAGNOSIS — Z881 Allergy status to other antibiotic agents status: Secondary | ICD-10-CM | POA: Diagnosis not present

## 2021-02-16 DIAGNOSIS — Z888 Allergy status to other drugs, medicaments and biological substances status: Secondary | ICD-10-CM | POA: Diagnosis not present

## 2021-02-16 DIAGNOSIS — D500 Iron deficiency anemia secondary to blood loss (chronic): Secondary | ICD-10-CM | POA: Diagnosis not present

## 2021-02-16 DIAGNOSIS — K56600 Partial intestinal obstruction, unspecified as to cause: Secondary | ICD-10-CM | POA: Diagnosis not present

## 2021-02-16 DIAGNOSIS — D509 Iron deficiency anemia, unspecified: Secondary | ICD-10-CM | POA: Diagnosis not present

## 2021-02-16 DIAGNOSIS — Z5189 Encounter for other specified aftercare: Secondary | ICD-10-CM | POA: Diagnosis not present

## 2021-02-16 MED ORDER — PEGFILGRASTIM-CBQV 6 MG/0.6ML ~~LOC~~ SOSY
6.0000 mg | PREFILLED_SYRINGE | Freq: Once | SUBCUTANEOUS | Status: AC
  Administered 2021-02-16: 6 mg via SUBCUTANEOUS

## 2021-02-16 MED ORDER — PEGFILGRASTIM-CBQV 6 MG/0.6ML ~~LOC~~ SOSY
PREFILLED_SYRINGE | SUBCUTANEOUS | Status: AC
  Filled 2021-02-16: qty 0.6

## 2021-02-17 ENCOUNTER — Other Ambulatory Visit: Payer: Self-pay | Admitting: Physician Assistant

## 2021-02-17 DIAGNOSIS — C8510 Unspecified B-cell lymphoma, unspecified site: Secondary | ICD-10-CM

## 2021-02-18 ENCOUNTER — Inpatient Hospital Stay: Payer: PPO

## 2021-02-18 ENCOUNTER — Inpatient Hospital Stay (HOSPITAL_COMMUNITY)
Admission: AD | Admit: 2021-02-18 | Discharge: 2021-02-22 | DRG: 809 | Disposition: A | Discharge status: Home / Self Care | Payer: PPO | Source: Ambulatory Visit | Attending: Internal Medicine | Admitting: Internal Medicine

## 2021-02-18 ENCOUNTER — Other Ambulatory Visit: Payer: Self-pay

## 2021-02-18 ENCOUNTER — Inpatient Hospital Stay: Payer: PPO | Admitting: Physician Assistant

## 2021-02-18 ENCOUNTER — Telehealth: Payer: Self-pay

## 2021-02-18 ENCOUNTER — Inpatient Hospital Stay (HOSPITAL_COMMUNITY): Payer: PPO

## 2021-02-18 VITALS — BP 113/58 | HR 102 | Temp 100.6°F | Resp 18 | Ht 66.0 in | Wt 122.7 lb

## 2021-02-18 DIAGNOSIS — Z20822 Contact with and (suspected) exposure to covid-19: Secondary | ICD-10-CM | POA: Diagnosis present

## 2021-02-18 DIAGNOSIS — J309 Allergic rhinitis, unspecified: Secondary | ICD-10-CM | POA: Diagnosis present

## 2021-02-18 DIAGNOSIS — R5081 Fever presenting with conditions classified elsewhere: Secondary | ICD-10-CM | POA: Diagnosis present

## 2021-02-18 DIAGNOSIS — K1230 Oral mucositis (ulcerative), unspecified: Secondary | ICD-10-CM | POA: Diagnosis present

## 2021-02-18 DIAGNOSIS — G4700 Insomnia, unspecified: Secondary | ICD-10-CM | POA: Diagnosis present

## 2021-02-18 DIAGNOSIS — T451X5A Adverse effect of antineoplastic and immunosuppressive drugs, initial encounter: Secondary | ICD-10-CM | POA: Diagnosis present

## 2021-02-18 DIAGNOSIS — J45909 Unspecified asthma, uncomplicated: Secondary | ICD-10-CM | POA: Diagnosis present

## 2021-02-18 DIAGNOSIS — J310 Chronic rhinitis: Secondary | ICD-10-CM | POA: Diagnosis present

## 2021-02-18 DIAGNOSIS — E039 Hypothyroidism, unspecified: Secondary | ICD-10-CM | POA: Diagnosis present

## 2021-02-18 DIAGNOSIS — D649 Anemia, unspecified: Secondary | ICD-10-CM

## 2021-02-18 DIAGNOSIS — E559 Vitamin D deficiency, unspecified: Secondary | ICD-10-CM | POA: Diagnosis present

## 2021-02-18 DIAGNOSIS — D0512 Intraductal carcinoma in situ of left breast: Secondary | ICD-10-CM | POA: Diagnosis present

## 2021-02-18 DIAGNOSIS — E782 Mixed hyperlipidemia: Secondary | ICD-10-CM | POA: Diagnosis present

## 2021-02-18 DIAGNOSIS — Z803 Family history of malignant neoplasm of breast: Secondary | ICD-10-CM

## 2021-02-18 DIAGNOSIS — C8510 Unspecified B-cell lymphoma, unspecified site: Secondary | ICD-10-CM

## 2021-02-18 DIAGNOSIS — Z888 Allergy status to other drugs, medicaments and biological substances status: Secondary | ICD-10-CM

## 2021-02-18 DIAGNOSIS — Z79890 Hormone replacement therapy (postmenopausal): Secondary | ICD-10-CM

## 2021-02-18 DIAGNOSIS — K56600 Partial intestinal obstruction, unspecified as to cause: Secondary | ICD-10-CM | POA: Diagnosis present

## 2021-02-18 DIAGNOSIS — D500 Iron deficiency anemia secondary to blood loss (chronic): Secondary | ICD-10-CM | POA: Diagnosis present

## 2021-02-18 DIAGNOSIS — F32A Depression, unspecified: Secondary | ICD-10-CM | POA: Diagnosis present

## 2021-02-18 DIAGNOSIS — E876 Hypokalemia: Secondary | ICD-10-CM | POA: Diagnosis present

## 2021-02-18 DIAGNOSIS — Z881 Allergy status to other antibiotic agents status: Secondary | ICD-10-CM | POA: Diagnosis not present

## 2021-02-18 DIAGNOSIS — R509 Fever, unspecified: Secondary | ICD-10-CM

## 2021-02-18 DIAGNOSIS — Z170 Estrogen receptor positive status [ER+]: Secondary | ICD-10-CM

## 2021-02-18 DIAGNOSIS — F419 Anxiety disorder, unspecified: Secondary | ICD-10-CM | POA: Diagnosis present

## 2021-02-18 DIAGNOSIS — I10 Essential (primary) hypertension: Secondary | ICD-10-CM | POA: Diagnosis present

## 2021-02-18 DIAGNOSIS — D709 Neutropenia, unspecified: Secondary | ICD-10-CM | POA: Diagnosis not present

## 2021-02-18 DIAGNOSIS — C8339 Diffuse large B-cell lymphoma, extranodal and solid organ sites: Secondary | ICD-10-CM | POA: Diagnosis present

## 2021-02-18 DIAGNOSIS — D72829 Elevated white blood cell count, unspecified: Secondary | ICD-10-CM | POA: Diagnosis not present

## 2021-02-18 DIAGNOSIS — C8519 Unspecified B-cell lymphoma, extranodal and solid organ sites: Secondary | ICD-10-CM | POA: Diagnosis not present

## 2021-02-18 DIAGNOSIS — E538 Deficiency of other specified B group vitamins: Secondary | ICD-10-CM | POA: Diagnosis present

## 2021-02-18 DIAGNOSIS — D701 Agranulocytosis secondary to cancer chemotherapy: Secondary | ICD-10-CM | POA: Diagnosis present

## 2021-02-18 DIAGNOSIS — Z90710 Acquired absence of both cervix and uterus: Secondary | ICD-10-CM

## 2021-02-18 DIAGNOSIS — Z853 Personal history of malignant neoplasm of breast: Secondary | ICD-10-CM | POA: Diagnosis not present

## 2021-02-18 DIAGNOSIS — Z79899 Other long term (current) drug therapy: Secondary | ICD-10-CM

## 2021-02-18 DIAGNOSIS — K219 Gastro-esophageal reflux disease without esophagitis: Secondary | ICD-10-CM | POA: Diagnosis present

## 2021-02-18 DIAGNOSIS — Z87891 Personal history of nicotine dependence: Secondary | ICD-10-CM

## 2021-02-18 DIAGNOSIS — Z7952 Long term (current) use of systemic steroids: Secondary | ICD-10-CM

## 2021-02-18 LAB — SAMPLE TO BLOOD BANK

## 2021-02-18 LAB — CBC WITH DIFFERENTIAL (CANCER CENTER ONLY)
Abs Immature Granulocytes: 0 10*3/uL (ref 0.00–0.07)
Band Neutrophils: 4 %
Basophils Absolute: 0 10*3/uL (ref 0.0–0.1)
Basophils Relative: 1 %
Eosinophils Absolute: 0 10*3/uL (ref 0.0–0.5)
Eosinophils Relative: 0 %
HCT: 27 % — ABNORMAL LOW (ref 36.0–46.0)
Hemoglobin: 8.9 g/dL — ABNORMAL LOW (ref 12.0–15.0)
Lymphocytes Relative: 11 %
Lymphs Abs: 0.1 10*3/uL — ABNORMAL LOW (ref 0.7–4.0)
MCH: 28.8 pg (ref 26.0–34.0)
MCHC: 33 g/dL (ref 30.0–36.0)
MCV: 87.4 fL (ref 80.0–100.0)
Metamyelocytes Relative: 5 %
Monocytes Absolute: 0.1 10*3/uL (ref 0.1–1.0)
Monocytes Relative: 11 %
Neutro Abs: 0.6 10*3/uL — ABNORMAL LOW (ref 1.7–7.7)
Neutrophils Relative %: 68 %
Platelet Count: 118 10*3/uL — ABNORMAL LOW (ref 150–400)
RBC: 3.09 MIL/uL — ABNORMAL LOW (ref 3.87–5.11)
RDW: 17.5 % — ABNORMAL HIGH (ref 11.5–15.5)
WBC Count: 0.9 10*3/uL — CL (ref 4.0–10.5)
nRBC: 0 % (ref 0.0–0.2)

## 2021-02-18 LAB — URINALYSIS, ROUTINE W REFLEX MICROSCOPIC
Bacteria, UA: NONE SEEN
Glucose, UA: NEGATIVE mg/dL
Hgb urine dipstick: NEGATIVE
Ketones, ur: NEGATIVE mg/dL
Nitrite: NEGATIVE
Protein, ur: NEGATIVE mg/dL
Renal Epithelial: <1
Specific Gravity, Urine: 1.035 — ABNORMAL HIGH (ref 1.005–1.030)
pH: 5 (ref 5.0–8.0)

## 2021-02-18 LAB — CMP (CANCER CENTER ONLY)
ALT: 30 U/L (ref 0–44)
AST: 18 U/L (ref 15–41)
Albumin: 3 g/dL — ABNORMAL LOW (ref 3.5–5.0)
Alkaline Phosphatase: 61 U/L (ref 38–126)
Anion gap: 8 (ref 5–15)
BUN: 13 mg/dL (ref 8–23)
CO2: 27 mmol/L (ref 22–32)
Calcium: 8.3 mg/dL — ABNORMAL LOW (ref 8.9–10.3)
Chloride: 102 mmol/L (ref 98–111)
Creatinine: 0.58 mg/dL (ref 0.44–1.00)
GFR, Estimated: >60 mL/min (ref >60)
Glucose, Bld: 99 mg/dL (ref 70–99)
Potassium: 3.5 mmol/L (ref 3.5–5.1)
Sodium: 137 mmol/L (ref 135–145)
Total Bilirubin: 0.8 mg/dL (ref 0.3–1.2)
Total Protein: 5.2 g/dL — ABNORMAL LOW (ref 6.5–8.1)

## 2021-02-18 LAB — TYPE AND SCREEN
ABO/RH(D): O POS
Antibody Screen: NEGATIVE

## 2021-02-18 LAB — C DIFFICILE QUICK SCREEN W PCR REFLEX
C Diff antigen: NONREACTIVE — AB
C Diff interpretation: NEGATIVE
C Diff toxin: NONREACTIVE — AB

## 2021-02-18 LAB — LACTATE DEHYDROGENASE: LDH: 141 U/L (ref 98–192)

## 2021-02-18 MED ORDER — RINGERS IV SOLN: INTRAVENOUS | Status: DC

## 2021-02-18 MED ORDER — VANCOMYCIN HCL 1000 MG/200ML IV SOLN
1000.0000 mg | INTRAVENOUS | Status: DC
  Administered 2021-02-19 – 2021-02-21 (×3): 1000 mg via INTRAVENOUS
  Filled 2021-02-18 (×3): qty 200

## 2021-02-18 MED ORDER — EZETIMIBE 10 MG PO TABS
10.0000 mg | ORAL_TABLET | Freq: Every day | ORAL | Status: DC
  Administered 2021-02-18 – 2021-02-21 (×4): 10 mg via ORAL
  Filled 2021-02-18 (×4): qty 1

## 2021-02-18 MED ORDER — ACETAMINOPHEN 325 MG PO TABS
650.0000 mg | ORAL_TABLET | Freq: Four times a day (QID) | ORAL | Status: DC | PRN
  Administered 2021-02-19 – 2021-02-20 (×2): 650 mg via ORAL
  Filled 2021-02-18 (×2): qty 2

## 2021-02-18 MED ORDER — VANCOMYCIN HCL 1000 MG/200ML IV SOLN
1000.0000 mg | Freq: Once | INTRAVENOUS | Status: AC
  Administered 2021-02-18: 1000 mg via INTRAVENOUS
  Filled 2021-02-18: qty 200

## 2021-02-18 MED ORDER — LEVOTHYROXINE SODIUM 88 MCG PO TABS
88.0000 ug | ORAL_TABLET | ORAL | Status: DC
  Administered 2021-02-19 – 2021-02-21 (×2): 88 ug via ORAL
  Filled 2021-02-18 (×4): qty 1

## 2021-02-18 MED ORDER — SODIUM CHLORIDE 0.9 % IV SOLN
2.0000 g | Freq: Two times a day (BID) | INTRAVENOUS | Status: DC
  Administered 2021-02-18 – 2021-02-22 (×8): 2 g via INTRAVENOUS
  Filled 2021-02-18 (×9): qty 2

## 2021-02-18 MED ORDER — ACETAMINOPHEN 325 MG PO TABS
ORAL_TABLET | ORAL | Status: AC
  Filled 2021-02-18: qty 2

## 2021-02-18 MED ORDER — LIDOCAINE-PRILOCAINE 2.5-2.5 % EX CREA: 1.0000 "application " | TOPICAL_CREAM | Freq: Every day | CUTANEOUS | Status: DC | PRN

## 2021-02-18 MED ORDER — ADULT MULTIVITAMIN W/MINERALS CH
1.0000 | ORAL_TABLET | Freq: Every day | ORAL | Status: DC
  Administered 2021-02-19 – 2021-02-22 (×4): 1 via ORAL
  Filled 2021-02-18 (×4): qty 1

## 2021-02-18 MED ORDER — LEVOCETIRIZINE DIHYDROCHLORIDE 5 MG PO TABS
5.0000 mg | ORAL_TABLET | Freq: Every day | ORAL | Status: DC
  Filled 2021-02-18: qty 1

## 2021-02-18 MED ORDER — LACTATED RINGERS IV SOLN: INTRAVENOUS | Status: DC

## 2021-02-18 MED ORDER — VENLAFAXINE HCL ER 37.5 MG PO CP24
37.5000 mg | ORAL_CAPSULE | Freq: Every day | ORAL | Status: DC
  Administered 2021-02-19 – 2021-02-22 (×4): 37.5 mg via ORAL
  Filled 2021-02-18 (×4): qty 1

## 2021-02-18 MED ORDER — ACETAMINOPHEN 650 MG RE SUPP: 650.0000 mg | Freq: Four times a day (QID) | RECTAL | Status: DC | PRN

## 2021-02-18 MED ORDER — CYANOCOBALAMIN 1000 MCG/ML IJ SOLN: 1000.0000 ug | INTRAMUSCULAR | Status: DC

## 2021-02-18 MED ORDER — MAGIC MOUTHWASH W/LIDOCAINE
5.0000 mL | Freq: Four times a day (QID) | ORAL | Status: DC | PRN
  Administered 2021-02-18 – 2021-02-21 (×5): 5 mL via ORAL
  Filled 2021-02-18 (×6): qty 5

## 2021-02-18 MED ORDER — LEVOTHYROXINE SODIUM 100 MCG PO TABS
100.0000 ug | ORAL_TABLET | ORAL | Status: DC
  Administered 2021-02-20 – 2021-02-22 (×2): 100 ug via ORAL
  Filled 2021-02-18 (×2): qty 1

## 2021-02-18 MED ORDER — ONDANSETRON HCL 4 MG PO TABS: 4.0000 mg | ORAL_TABLET | Freq: Four times a day (QID) | ORAL | Status: DC | PRN

## 2021-02-18 MED ORDER — FLUTICASONE PROPIONATE 50 MCG/ACT NA SUSP
2.0000 | Freq: Every day | NASAL | Status: DC
  Administered 2021-02-21 – 2021-02-22 (×2): 2 via NASAL
  Filled 2021-02-18: qty 16

## 2021-02-18 MED ORDER — ACETAMINOPHEN 325 MG PO TABS
650.0000 mg | ORAL_TABLET | Freq: Once | ORAL | Status: AC
  Administered 2021-02-18: 650 mg via ORAL

## 2021-02-18 MED ORDER — SENNOSIDES-DOCUSATE SODIUM 8.6-50 MG PO TABS: 1.0000 | ORAL_TABLET | Freq: Every evening | ORAL | Status: DC | PRN

## 2021-02-18 MED ORDER — ONDANSETRON HCL 4 MG/2ML IJ SOLN: 4.0000 mg | Freq: Four times a day (QID) | INTRAMUSCULAR | Status: DC | PRN

## 2021-02-18 MED ORDER — ENOXAPARIN SODIUM 40 MG/0.4ML IJ SOSY
40.0000 mg | PREFILLED_SYRINGE | INTRAMUSCULAR | Status: DC
  Administered 2021-02-18 – 2021-02-21 (×4): 40 mg via SUBCUTANEOUS
  Filled 2021-02-18 (×4): qty 0.4

## 2021-02-18 MED ORDER — ALUM & MAG HYDROXIDE-SIMETH 200-200-20 MG/5ML PO SUSP: 30.0000 mL | ORAL | Status: DC | PRN

## 2021-02-18 MED ORDER — PROCHLORPERAZINE MALEATE 10 MG PO TABS
10.0000 mg | ORAL_TABLET | Freq: Four times a day (QID) | ORAL | Status: DC | PRN
  Administered 2021-02-21: 10 mg via ORAL
  Filled 2021-02-18: qty 1

## 2021-02-18 MED ORDER — MONTELUKAST SODIUM 10 MG PO TABS
10.0000 mg | ORAL_TABLET | Freq: Every day | ORAL | Status: DC
  Administered 2021-02-19 – 2021-02-22 (×4): 10 mg via ORAL
  Filled 2021-02-18 (×4): qty 1

## 2021-02-18 MED ORDER — MIRTAZAPINE 15 MG PO TABS
15.0000 mg | ORAL_TABLET | Freq: Every day | ORAL | Status: DC
  Administered 2021-02-18 – 2021-02-21 (×4): 15 mg via ORAL
  Filled 2021-02-18 (×4): qty 1

## 2021-02-18 MED ORDER — ATORVASTATIN CALCIUM 10 MG PO TABS
10.0000 mg | ORAL_TABLET | Freq: Every day | ORAL | Status: DC
  Administered 2021-02-18 – 2021-02-21 (×4): 10 mg via ORAL
  Filled 2021-02-18 (×4): qty 1

## 2021-02-18 MED ORDER — DEXAMETHASONE 4 MG PO TABS: 8.0000 mg | ORAL_TABLET | ORAL | Status: DC

## 2021-02-18 MED ORDER — B COMPLEX PO CAPS: ORAL_CAPSULE | Freq: Every day | ORAL | Status: DC

## 2021-02-18 MED ORDER — LORAZEPAM 0.5 MG PO TABS
0.5000 mg | ORAL_TABLET | Freq: Every day | ORAL | Status: DC
  Administered 2021-02-18 – 2021-02-21 (×4): 0.5 mg via ORAL
  Filled 2021-02-18 (×5): qty 1

## 2021-02-18 MED ORDER — MAGIC MOUTHWASH W/LIDOCAINE: 5.0000 mL | Freq: Four times a day (QID) | ORAL | Status: DC | PRN

## 2021-02-18 MED ORDER — ALBUTEROL SULFATE (2.5 MG/3ML) 0.083% IN NEBU: 2.5000 mg | INHALATION_SOLUTION | Freq: Four times a day (QID) | RESPIRATORY_TRACT | Status: DC | PRN

## 2021-02-18 MED ORDER — HYDROCODONE-ACETAMINOPHEN 5-325 MG PO TABS
1.0000 | ORAL_TABLET | Freq: Four times a day (QID) | ORAL | Status: DC | PRN
  Administered 2021-02-18 – 2021-02-19 (×3): 2 via ORAL
  Administered 2021-02-20 (×2): 1 via ORAL
  Filled 2021-02-18 (×2): qty 2
  Filled 2021-02-18: qty 1
  Filled 2021-02-18: qty 2
  Filled 2021-02-18: qty 1
  Filled 2021-02-18: qty 2

## 2021-02-18 NOTE — Progress Notes (Signed)
[facility] [phone]   [phone]  PROGRESS NOTE  Patient Care Team: Hamrick, Durward Fortes, MD as PCP - General (Family Medicine) Lupita Leash, MD as Consulting Physician (Pulmonary Disease) Salvatore Marvel, MD as Consulting Physician (Orthopedic Surgery) Nita Sells, MD as Consulting Physician (Dermatology) Antony Contras, MD as Consulting Physician (Ophthalmology) Griselda Miner, MD as Consulting Physician (General Surgery) Serena Croissant, MD as Consulting Physician (Hematology and Oncology) Charolett Bumpers, MD as Consulting Physician (Gastroenterology) Loa Socks, NP as Nurse Practitioner (Hematology and Oncology)  Hematological/Oncological History Oncology History  Cancer of right breast, stage 0  09/13/2009 Initial Biopsy   Right breast core needle biopsy: 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 Biopsy showed invasive and in situ ductal carcinoma, HER-2 equivocal by IHC (2+), negative by FISH (ratio 1.59), ER+ >95%, PR+ 85%, Ki67 10%.    Malignant neoplasm of left breast in female, estrogen receptor positive (HCC)  10/12/2020 Initial Diagnosis   Malignant neoplasm of left breast in female, estrogen receptor positive (HCC)    10/12/2020 Cancer Staging   Staging form: Breast, AJCC 8th Edition - Clinical stage from 10/12/2020: Stage IA (cT1b, cN0, cM0, G2, ER+, PR+, HER2-) - Signed by Serena Croissant, MD on 10/15/2020  Stage prefix: Initial diagnosis    11/05/2020 Surgery   Left lumpectomy: Grade 1 IDC, 1.1 cm, intermediate grade DCIS, margins are negative, lymph node -0/1, ER greater than 95%, PR 85%, HER-2  negative, Ki-67 10%   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HCC)  11/11/2020 Initial Diagnosis   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HCC)    11/15/2020 -  Chemotherapy    Patient is on Treatment Plan: IP NON-HODGKINS LYMPHOMA EPOCH Q21D   Patient is on Antibody Plan: NON-HODGKINS LYMPHOMA RITUXIMAB Q21D     11/19/2020 -  Chemotherapy    Patient is on Treatment Plan: IP NON-HODGKINS LYMPHOMA EPOCH Q21D   Patient is on Antibody Plan: NON-HODGKINS LYMPHOMA RITUXIMAB Q21D        CHIEF COMPLAINTS/PURPOSE OF CONSULTATION:  "Large B-Cell Lymphoma"  HISTORY OF PRESENTING ILLNESS:  Lindsey Price 75 y.o. female who presents to the clinic for a follow up evaluation and management of large B-cell lymphoma. Patient was last seen in clinic on 01/26/2021. Since then, patient was admitted for cycle 5 of EPOCH from 02/07/2021. Her hospitalization was complicated as she developed a bowel obstruction.   On exam today, Ms. Bebee reports feeling fatigued, mainly sitting on the couch for most of the day. She is able to complete her basic ADLs on her own including self care. Her appetite is fair but she is trying to eat throughout the day. She denies any nausea, vomiting or abdominal pain. She developed diarrhea following hospital discharge which has resolved today. She is taking stool softeners and miralax daily. Patient denies any hematochezia or melena. 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 Patient denies any fevers, chills, shortness of breath, chest pain, cough, edema or skin changes. She has no other complaints. Rest of the 10 point ROS is below.   MEDICAL HISTORY:  Past Medical History:  Diagnosis Date   Allergy  Anemia    Anxiety    Arthralgia 09/11/2011   Asthma    cough variant asthma   Breast cancer ([place]) 2011   RIGHT BREAST CA   Breast cancer, left ([place]) 09/2020   left breast IDC   Cataract    DCIS (ductal carcinoma in situ) of breast 2011   DCIS   Depression     GERD (gastroesophageal reflux disease)    Hyperlipidemia    Hypertension    Hypothyroidism    Insomnia    Thyroid disease    hypothyroidism    SURGICAL HISTORY: Past Surgical History:  Procedure Laterality Date   ABDOMINAL HYSTERECTOMY  1975   partial   BREAST BIOPSY Right 09/10/2013   BENIGN   BREAST BIOPSY Left 09/07/2011   BENIGN   BREAST LUMPECTOMY Right 2011   BREAST LUMPECTOMY WITH RADIOACTIVE SEED AND SENTINEL LYMPH NODE BIOPSY Left 11/05/2020   Procedure: LEFT BREAST LUMPECTOMY WITH RADIOACTIVE SEED AND SENTINEL LYMPH NODE BIOPSY;  Surgeon: Jovita Kussmaul, MD;  Location: [place];  Service: General;  Laterality: Left;   CATARACT EXTRACTION, BILATERAL     COLONOSCOPY  2015   COLONOSCOPY WITH PROPOFOL N/A 04/14/2014   Procedure: COLONOSCOPY WITH PROPOFOL;  Surgeon: Garlan Fair, MD;  Location: [facility];  Service: Endoscopy;  Laterality: N/A;   IR IMAGING GUIDED PORT INSERTION  12/01/2020   POLYPECTOMY     ROTATOR CUFF REPAIR Left 04/2013   Dr. Para March    SOCIAL HISTORY: Social History   Socioeconomic History   Marital status: Married    Spouse name: Not on file   Number of children: Not on file   Years of education: Not on file   Highest education level: Not on file  Occupational History   Not on file  Tobacco Use   Smoking status: Former    Packs/day: 1.00    Years: 15.00    Pack years: 15.00    Types: Cigarettes    Quit date: 12/09/1978    Years since quitting: 42.2   Smokeless tobacco: Never  Vaping Use   Vaping Use: Never used  Substance and Sexual Activity   Alcohol use: Yes    Alcohol/week: 7.0 standard drinks    Types: 7 Glasses of wine per week    Comment: SOCIALLY wine   Drug use: No   Sexual activity: Yes    Birth control/protection: Surgical  Other Topics Concern   Not on file  Social History Narrative   Not on file   Social Determinants of Health   Financial Resource Strain: Not on file  Food Insecurity: Not on  file  Transportation Needs: Not on file  Physical Activity: Not on file  Stress: Not on file  Social Connections: Not on file  Intimate Partner Violence: Not At Risk   Fear of Current or Ex-Partner: No   Emotionally Abused: No   Physically Abused: No   Sexually Abused: No    FAMILY HISTORY: Family History  Problem Relation Age of Onset   Cancer Mother        BREAST   Cancer Father        pancreatic   Cancer Sister        sebacous cell carcinoma   Cancer Maternal Aunt        lung   Cancer Paternal Uncle        colon, stomach   Colon cancer Neg Hx    Colon polyps Neg Hx    Esophageal  cancer Neg Hx    Rectal cancer Neg Hx    Stomach cancer Neg Hx     ALLERGIES:  is allergic to advair diskus [fluticasone-salmeterol] and neosporin [neomycin-polymyxin-gramicidin].  MEDICATIONS:  Current Outpatient Medications  Medication Sig Dispense Refill   albuterol (PROAIR HFA) 108 (90 Base) MCG/ACT inhaler Inhale 1-2 puffs into the lungs every 6 (six) hours as needed for wheezing or shortness of breath. 8 g 5   alum & mag hydroxide-simeth (MAALOX/MYLANTA) 200-200-20 MG/5ML suspension Take 30 mLs by mouth every 4 (four) hours as needed for indigestion. 355 mL 0   atorvastatin (LIPITOR) 10 MG tablet Take 1 tablet (10 mg total) by mouth at bedtime. 90 tablet 0   B Complex Vitamins (B COMPLEX PO) Take 1 tablet by mouth daily with breakfast.     calcium carbonate (OS-CAL - DOSED IN MG OF ELEMENTAL CALCIUM) 1250 (500 Ca) MG tablet Take 1,250 mg by mouth daily.     cyanocobalamin (,VITAMIN B-12,) 1000 MCG/ML injection Inject 1 mL (1,000 mcg total) into the skin every 30 (thirty) days. 1 mL 0   dexamethasone (DECADRON) 4 MG tablet Take 2 tablets (8 mg total) by mouth See admin instructions. Take 8 mg by mouth two times a day with a meal starting the day after chemotherapy- for 3 days     ezetimibe (ZETIA) 10 MG tablet Take 1 tablet (10 mg total) by mouth at bedtime.     famotidine (PEPCID) 20 MG  tablet TAKE 1 TABLET (20 MG TOTAL) BY MOUTH 2 (TWO) TIMES DAILY AS NEEDED FOR HEARTBURN OR INDIGESTION. 60 tablet 1   feeding supplement (ENSURE ENLIVE / ENSURE PLUS) LIQD Take 237 mLs by mouth 2 (two) times daily between meals. 237 mL 12   FLOVENT HFA 44 MCG/ACT inhaler Inhale 2 puffs into the lungs 2 (two) times daily.     fluticasone (FLONASE) 50 MCG/ACT nasal spray Place 2 sprays into both nostrils daily.     HYDROcodone-acetaminophen (NORCO/VICODIN) 5-325 MG tablet Take 1-2 tablets by mouth every 6 (six) hours as needed for moderate pain or severe pain. 10 tablet 0   levocetirizine (XYZAL) 5 MG tablet Take 5 mg by mouth daily.     levothyroxine (SYNTHROID) 100 MCG tablet TAKE 1 TABLET EVERY OTHER DAY ALTERNATING WITH 88 MCG TABLET 45 tablet 1   levothyroxine (SYNTHROID) 88 MCG tablet * 45 tablet 1   lidocaine-prilocaine (EMLA) cream Apply 1 application topically as needed (as directed). 30 g 0   LORazepam (ATIVAN) 0.5 MG tablet Take 1 tablet (0.5 mg total) by mouth See admin instructions. Take 0.5 mg by mouth at bedtime nightly while hospitalized     magic mouthwash w/lidocaine SOLN Take 5 mLs by mouth 4 (four) times daily as needed for mouth pain. Swish and swallow 1 part diphenhydramine 12.5 mg per 5 mL elixir (61ml), 1 part Maalox (do not substitute Kaopectate) 43ml, 1 part 2% viscous lidocaine (40 ml), 1 part nystatin 500000 units/ml (21ml) and 1 part distilled water (21ml). Plz dispense 200 ml 200 mL 1   mirtazapine (REMERON) 15 MG tablet Take 1 tablet (15 mg total) by mouth at bedtime. 30 tablet 1   montelukast (SINGULAIR) 10 MG tablet Take 1 tablet (10 mg total) by mouth daily.     Multiple Vitamin (MULTIVITAMIN WITH MINERALS) TABS tablet Take 1 tablet by mouth daily.     NEXIUM 20 MG capsule Take 40 mg by mouth 2 (two) times daily before a meal.     ondansetron (  ZOFRAN) 8 MG tablet Take 1 tablet (8 mg total) by mouth See admin instructions. Take 8 mg by mouth two times a day starting  the day after chemo for- 3 days, then, as needed, two times a day as needed for nausea or vomiting     polyethylene glycol (MIRALAX / GLYCOLAX) 17 g packet Take 17 g by mouth daily. 14 each 0   prochlorperazine (COMPAZINE) 10 MG tablet Take 1 tablet (10 mg total) by mouth every 6 (six) hours as needed for vomiting or nausea.     prochlorperazine (COMPAZINE) 25 MG suppository Place 1 suppository (25 mg total) rectally every 12 (twelve) hours as needed for nausea. 12 suppository 3   senna-docusate (SENOKOT-S) 8.6-50 MG tablet Take 1 tablet by mouth 2 (two) times daily.     Sodium Chloride-Sodium Bicarb (SODIUM BICARBONATE/SODIUM CHLORIDE) SOLN 1 application by Mouth Rinse route See admin instructions. Use as directed three to four times daily     venlafaxine XR (EFFEXOR-XR) 37.5 MG 24 hr capsule Take 1 capsule (37.5 mg total) by mouth daily with breakfast.     Vitamin D, Ergocalciferol, (DRISDOL) 1.25 MG (50000 UNIT) CAPS capsule Take 1 capsule (50,000 Units total) by mouth every Friday.     No current facility-administered medications for this visit.    REVIEW OF SYSTEMS:   Constitutional: ( - ) fevers, ( - )  chills , ( - ) night sweats Eyes: ( - ) blurriness of vision, ( - ) double vision, ( - ) watery eyes Ears, nose, mouth, throat, and face: ( - ) mucositis, ( - ) sore throat Respiratory: ( - ) cough, ( - ) dyspnea, ( - ) wheezes Cardiovascular: ( - ) palpitation, ( - ) chest discomfort, ( - ) lower extremity swelling Gastrointestinal:  ( - ) nausea, ( - ) heartburn, ( + ) diarrhea Skin: ( - ) abnormal skin rashes Lymphatics: ( - ) new lymphadenopathy, ( - ) easy bruising Neurological: ( + ) numbness, ( - ) tingling, ( - ) new weaknesses Behavioral/Psych: ( - ) mood change, ( - ) new changes  All other systems were reviewed with the patient and are negative.  PHYSICAL EXAMINATION: ECOG PERFORMANCE STATUS: 2 - Symptomatic, <50% confined to bed  Vitals:   02/18/21 1342  BP: (!) 113/58   Pulse: (!) 102  Resp: 18  Temp: (!) 100.6 F (38.1 C)  SpO2: 100%    Filed Weights   02/18/21 1342  Weight: 122 lb 11.2 oz (55.7 kg)     GENERAL: thin female in NAD  SKIN: skin color, texture, turgor are normal, no rashes or significant lesions EYES: conjunctiva are pink and non-injected, sclera clear OROPHARYNX: no exudate, no erythema; lips, buccal mucosa, and tongue normal  NECK: supple, non-tender LYMPH:  no palpable lymphadenopathy in the cervical, axillary or supraclavicular lymph nodes.  LUNGS: clear to auscultation and percussion with normal breathing effort HEART: regular rate & rhythm and no murmurs and no lower extremity edema ABDOMEN: soft, non-tender, non-distended, normal bowel sounds Musculoskeletal: no cyanosis of digits and no clubbing  PSYCH: alert & oriented x 3, fluent speech NEURO: no focal motor/sensory deficits  LABORATORY DATA:  I have reviewed the data as listed CBC Latest Ref Rng & Units 02/18/2021 02/14/2021 02/11/2021  WBC 4.0 - 10.5 K/uL 0.9(LL) 2.7(L) 7.6  Hemoglobin 12.0 - 15.0 g/dL 8.9(L) 9.0(L) 8.9(L)  Hematocrit 36.0 - 46.0 % 27.0(L) 28.2(L) 27.1(L)  Platelets 150 - 400 K/uL 118(L) 261 405(H)  CMP Latest Ref Rng & Units 02/18/2021 02/15/2021 02/14/2021  Glucose 70 - 99 mg/dL 99 87 84  BUN 8 - 23 mg/dL $Remove'13 10 9  'AEeTxNu$ Creatinine 0.44 - 1.00 mg/dL 0.58 0.49 0.44  Sodium 135 - 145 mmol/L 137 140 138  Potassium 3.5 - 5.1 mmol/L 3.5 3.6 2.9(L)  Chloride 98 - 111 mmol/L 102 107 104  CO2 22 - 32 mmol/L $RemoveB'27 28 28  'JCvlGjbm$ Calcium 8.9 - 10.3 mg/dL 8.3(L) 8.5(L) 7.8(L)  Total Protein 6.5 - 8.1 g/dL 5.2(L) - 4.4(L)  Total Bilirubin 0.3 - 1.2 mg/dL 0.8 - 0.4  Alkaline Phos 38 - 126 U/L 61 - 44  AST 15 - 41 U/L 18 - 13(L)  ALT 0 - 44 U/L 30 - 15     RADIOGRAPHIC STUDIES: I have personally reviewed the radiological images as listed and agreed with the findings in the report. CT ABDOMEN PELVIS W CONTRAST  Result Date: 02/11/2021 CLINICAL DATA:  History of  large B-cell lymphoma involving the cecum and distal ileum. EXAM: CT ABDOMEN AND PELVIS WITH CONTRAST TECHNIQUE: Multidetector CT imaging of the abdomen and pelvis was performed using the standard protocol following bolus administration of intravenous contrast. CONTRAST:  53mL OMNIPAQUE IOHEXOL 300 MG/ML  SOLN COMPARISON:  CT scan 10/21/2020 FINDINGS: Lower chest: Very small bilateral pleural effusions with minimal overlying atelectasis. No worrisome pulmonary lesions. Probable radiation changes involving the anterior aspect of the right lung and there are surgical changes involving the right breast. The heart is within normal limits in size. No pericardial effusion. Hepatobiliary: No hepatic lesions are identified. Gallbladder is slightly distended no definite gallstones or findings for acute cholecystitis. No common bile duct dilatation. Pancreas: No mass, inflammation or ductal dilatation. Spleen: Normal size. No focal lesions. Adrenals/Urinary Tract: Adrenal glands and kidneys are unremarkable. The bladder is unremarkable. Stomach/Bowel: The stomach and duodenum are unremarkable. There is a fairly high-grade small-bowel obstruction noted with markedly dilated small bowel loops and air-fluid/air contrast levels throughout. The colon is decompressed. The small-bowel obstruction appears to be at the distal ileum. There is a very irregular masslike area of scarring changes, fibrosis or treated lymphoid tissue in the right lower quadrant. Surrounding adhesions or scarring in this area. The cecum now appears normal. No residual bowel wall thickening. Vascular/Lymphatic: Stable atherosclerotic calcifications involving the aorta and iliac arteries but no aneurysm. No mesenteric or retroperitoneal mass or adenopathy. Reproductive: Surgically absent. Other: Small amount of free abdominal/free pelvic fluid likely related to the patient has small-bowel obstruction. Musculoskeletal: No significant bony findings. IMPRESSION:  1. Fairly high-grade small-bowel obstruction as detailed above. This appears to be due to a very irregular masslike area of scarring change or treated tumor in the right lower quadrant with surrounding fibrosis and adhesions. 2. No residual bowel wall thickening to suggest persistent lymphoma involving the cecum or terminal ileum. 3. Small amount of free abdominal/free pelvic fluid likely related to the small-bowel obstruction. 4. Very small bilateral pleural effusions with minimal overlying atelectasis. These results will be called to the ordering clinician or representative by the Radiologist Assistant, and communication documented in the PACS or [employer]. Aortic Atherosclerosis (ICD10-I70.0). Electronically Signed   By: Marijo Sanes M.D.   On: 02/11/2021 19:37   DG Abd 2 Views  Result Date: 02/13/2021 CLINICAL DATA:  Small-bowel obstruction on CT EXAM: ABDOMEN - 2 VIEW COMPARISON:  CT of 2 days ago FINDINGS: Supine and upright views. The upright view demonstrates no free intraperitoneal air. Right hemidiaphragm elevation. Cardiomegaly. No upper  abdominal air-fluid levels. The supine view demonstrates normal caliber gas-filled colon. Small bowel loops measure up to 4.0 cm, minimally decreased compared to the prior CT. IMPRESSION: Improvement in small-bowel obstruction pattern. No free intraperitoneal air or other acute complication. Electronically Signed   By: Abigail Miyamoto M.D.   On: 02/13/2021 10:16     ASSESSMENT & PLAN Lindsey Price is a 75 y.o. female presenting to the clinic for for a follow up after Cycle 5 of EPOCH.   #Large B-cell Lymphoma: --CT abdomen/pelvis with contrast 10/21/2020 - "Acute colitis/enteritis of the cecum/ascending colon and the associated terminal loops of ileum. As was recently described on CT imaging, the anatomy of distal ileum, ileocecal valve, cecum is atypical and correlation with patient's prior surgical record would be useful. The acute inflammatory  changes contribute to at least a partial small bowel obstruction. Primary concern of this inflammatory cystic mass/bowel of the right lower quadrant is malignancy given that the posterior margin is inseparable from the psoas muscle, adnexa, and the right ureter. --Colonoscopy performed 11/04/2020- "An infiltrative and ulcerated partially obstructing large mass was found at 85 cm proximal to the anus. The mass was circumferential." Biopsy of the colon mass consistent with large B-cell lymphoma --Patient initiated treatment with EPOCH on 11/15/2020. --PET scan from 12/17/2020 reveals hypermetabolism corresponding to the right lower quadrant, ileocolic mass consistent with the clinical history of bowel lymphoma. Diffuse marrow hypermetabolism which could represent marrow stimulation by chemotherapy or marrow involvement by lymphoma. Mild hypermetabolism of the spleen relative to the liver,nonspecific. No splenomegaly.Incidental findings, including: Coronary artery atherosclerosis.Aortic Atherosclerosis (ICD10-I70.0). Hepatic steatosis. Possible constipation. Motion degraded CT images. The right lower lobe pulmonary nodule described on prior CTs is not readily apparent but likely below PET Resolution. --Patient presents today, 02/18/2021 for a follow up after recent hospitalization for Cycle 5 of EPOCH. Her hospitalization was complicated with a bowel obstruction that resolved with bowel rest. Labs from today were reviewed that show stable anemia with hemoglobin of 8.9 and mild thrombocytopenia with platelet of 118K. Unfortunately she is neutropenic with ANC of 600 and she is febrile with a fever of 100.6 F. Recommended direct admission to rule out infectious process. Recommend workup with blood cultures x 2, urinanalysis, urine cultures, chest xray. In addition, we recommend initiating IV cefapine 2 g every 8 hours. Patient will follow up in clinic after hospital discharge.    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   #Bowel obstruction: --Patient is currently taking stool softener/miralax daily. Advised to hold if she has recurrent episodes of diarrhea.    #Neutropenic fever: --ANC is 600 today. Fever of 100.6 F.  --Patient denies any infectious process including cough, congestion, diarrhea or dysuria.  --Direct admission for further workup including blood cultures x 2, urinanalysis, urine cultures, chest xray.  --Recommend initiating IV cefapine 2 g every 8 hours.   No orders of the defined types were placed in this encounter.   All questions were answered. The patient knows to call the clinic with any problems, questions or concerns.  I have spent a total of 60 minutes minutes  of face-to-face and non-face-to-face time, preparing to see the patient, obtaining and/or reviewing separately obtained history, performing a medically appropriate examination, counseling and educating the patient, ordering medications, documenting clinical information in the electronic health record, and care coordination.    Dede Query, PA-C Department of Hematology/Oncology [place] at [facility] Phone: [phone]  Patient was seen with Dr. Lorenso Courier.   I have read the above note and personally examined the patient. I agree with the assessment and plan as noted above.  Briefly Mrs. 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  Unfortunately today she is neutropenic and  febrile.  Given these findings it would be safest to have her admitted to the hospital for IV antibiotics, culturing, and close monitoring.  She voices understanding of this plan moving forward.   Ledell Peoples, MD Department of Hematology/Oncology [place] at [facility] Phone: [phone] Pager: [phone] Email: [name]

## 2021-02-18 NOTE — Progress Notes (Signed)
Pt to be admitted to room 1601. Report called to [facility]. Pt to be transported via wheelchair, port to remain accessed.

## 2021-02-18 NOTE — Telephone Encounter (Signed)
CRITICAL VALUE STICKER  CRITICAL VALUE: WBC = 0.9  RECEIVER (on-site recipient of call): Yetta Glassman, [place] NOTIFIED: 02/18/21 at 1:47pm  MESSENGER (representative from lab): Pam  MD NOTIFIED: Dede Query, PA-C  TIME OF NOTIFICATION: 02/02/21 at 1:50pm  RESPONSE: Notification given directly to [facility] for follow-up with the pt.

## 2021-02-18 NOTE — H&P (Addendum)
History and Physical  Lindsey Price RCV:[id number] DOB: 1946-05-14 DOA: 02/18/2021  PCP: Leonides Sake, MD Patient coming from: Home   I have personally briefly reviewed patient's old medical records in [place]   Chief Complaint: Fever.   HPI: Lindsey Price is a 75 y.o. female past medical history significant for large B-cell lymphoma, undergoing chemo, .  History of breast cancer, hypertension, hypothyroidism who presents after direct admission from oncology office for further evaluation of neutropenic fever.  She was found to have to 100.6 white blood cells 0.9, neutrophil count 600, platelets 118. Patient denies cough, shortness of breath or chest pain.  He denies significant abdominal pain.  She reports mild sinus headache and some rhinitis.  She has been having rhinitis since she was a started on chemo. She reports 2-3 episode of watery stool.  She denies worsening abdominal pain.  She has been taking less MiraLAX. She received her shot for her white count 2 days ago. Labs at oncology office: 137, potassium 3.5, BUN 13, creatinine 0.5, AST 18, ALT 30, white blood cell 0.9, hemoglobin 8.9, platelets 118.   Of Note she had a CT abdomen 02/11/2021 that show fairly high-grade small bowel obstruction with appears to be due to a very irregular masslike area of his scaring changes or treated tumor in the right lower quadrant with surrounding fibrosis and adhesions.   Review of Systems: All systems reviewed and apart from history of presenting illness, are negative.  Past Medical History:  Diagnosis Date   Allergy    Anemia    Anxiety    Arthralgia 09/11/2011   Asthma    cough variant asthma   Breast cancer ([place]) 2011   RIGHT BREAST CA   Breast cancer, left ([place]) 09/2020   left breast IDC   Cataract    DCIS (ductal carcinoma in situ) of breast 2011   DCIS   Depression    GERD (gastroesophageal reflux disease)    Hyperlipidemia    Hypertension     Hypothyroidism    Insomnia    Thyroid disease    hypothyroidism   Past Surgical History:  Procedure Laterality Date   ABDOMINAL HYSTERECTOMY  1975   partial   BREAST BIOPSY Right 09/10/2013   BENIGN   BREAST BIOPSY Left 09/07/2011   BENIGN   BREAST LUMPECTOMY Right 2011   BREAST LUMPECTOMY WITH RADIOACTIVE SEED AND SENTINEL LYMPH NODE BIOPSY Left 11/05/2020   Procedure: LEFT BREAST LUMPECTOMY WITH RADIOACTIVE SEED AND SENTINEL LYMPH NODE BIOPSY;  Surgeon: Jovita Kussmaul, MD;  Location: [facility];  Service: General;  Laterality: Left;   CATARACT EXTRACTION, BILATERAL     COLONOSCOPY  2015   COLONOSCOPY WITH PROPOFOL N/A 04/14/2014   Procedure: COLONOSCOPY WITH PROPOFOL;  Surgeon: Garlan Fair, MD;  Location: [facility];  Service: Endoscopy;  Laterality: N/A;   IR IMAGING GUIDED PORT INSERTION  12/01/2020   POLYPECTOMY     ROTATOR CUFF REPAIR Left 04/2013   Dr. Para March   Social History:  reports that she quit smoking about 42 years ago. Her smoking use included cigarettes. She has a 15.00 pack-year smoking history. She has never used smokeless tobacco. She reports current alcohol use of about 7.0 standard drinks of alcohol per week. She reports that she does not use drugs.   Allergies  Allergen Reactions   Advair Diskus [Fluticasone-Salmeterol] Other (See Comments)    Hoarseness   Neosporin [Neomycin-Polymyxin-Gramicidin] Itching    Family  History  Problem Relation Age of Onset   Cancer Mother        BREAST   Cancer Father        pancreatic   Cancer Sister        sebacous cell carcinoma   Cancer Maternal Aunt        lung   Cancer Paternal Uncle        colon, stomach   Colon cancer Neg Hx    Colon polyps Neg Hx    Esophageal cancer Neg Hx    Rectal cancer Neg Hx    Stomach cancer Neg Hx       Prior to Admission medications   Medication Sig Start Date End Date Taking? Authorizing Provider  albuterol (PROAIR HFA) 108 (90 Base) MCG/ACT inhaler Inhale  1-2 puffs into the lungs every 6 (six) hours as needed for wheezing or shortness of breath. 12/10/20  Yes Curcio, Roselie Awkward, NP  alum & mag hydroxide-simeth (MAALOX/MYLANTA) 200-200-20 MG/5ML suspension Take 30 mLs by mouth every 4 (four) hours as needed for indigestion. 11/19/20  Yes Curcio, Roselie Awkward, NP  atorvastatin (LIPITOR) 10 MG tablet Take 1 tablet (10 mg total) by mouth at bedtime. 11/12/19  Yes Opalski, Deborah, DO  B Complex Vitamins (B COMPLEX PO) Take 1 tablet by mouth daily with breakfast.   Yes [provider]  calcium carbonate (OS-CAL - DOSED IN MG OF ELEMENTAL CALCIUM) 1250 (500 Ca) MG tablet Take 1,250 mg by mouth daily.   Yes [provider]  cyanocobalamin (,VITAMIN B-12,) 1000 MCG/ML injection Inject 1 mL (1,000 mcg total) into the skin every 30 (thirty) days. 12/16/20  Yes Curcio, Roselie Awkward, NP  dexamethasone (DECADRON) 4 MG tablet Take 2 tablets (8 mg total) by mouth See admin instructions. Take 8 mg by mouth two times a day with a meal starting the day after chemotherapy- for 3 days 02/15/21  Yes Curcio, Roselie Awkward, NP  ezetimibe (ZETIA) 10 MG tablet Take 1 tablet (10 mg total) by mouth at bedtime. 02/15/21  Yes Curcio, Roselie Awkward, NP  feeding supplement (ENSURE ENLIVE / ENSURE PLUS) LIQD Take 237 mLs by mouth 2 (two) times daily between meals. Patient taking differently: Take 237 mLs by mouth daily as needed (food supplement). 11/19/20  Yes Curcio, Roselie Awkward, NP  FLOVENT HFA 44 MCG/ACT inhaler Inhale 2 puffs into the lungs daily.   Yes [provider]  fluticasone (FLONASE) 50 MCG/ACT nasal spray Place 2 sprays into both nostrils daily. 02/15/21  Yes Curcio, Roselie Awkward, NP  HYDROcodone-acetaminophen (NORCO/VICODIN) 5-325 MG tablet Take 1-2 tablets by mouth every 6 (six) hours as needed for moderate pain or severe pain. 01/20/21  Yes Curcio, Roselie Awkward, NP  ibuprofen (ADVIL) 200 MG tablet Take 400 mg by mouth every 6 (six) hours as needed for mild pain.   Yes  [provider]  levocetirizine (XYZAL) 5 MG tablet Take 5 mg by mouth daily.   Yes [provider]  levothyroxine (SYNTHROID) 100 MCG tablet TAKE 1 TABLET EVERY OTHER DAY ALTERNATING WITH 88 MCG TABLET Patient taking differently: Take 100 mcg by mouth daily before breakfast. TAKE 1 TABLET EVERY OTHER DAY ALTERNATING WITH 88 MCG TABLET 11/24/19  Yes Opalski, Neoma Laming, DO  levothyroxine (SYNTHROID) 88 MCG tablet * Patient taking differently: Take 88 mcg by mouth every other day. Alternates with 100 mcg tablet 08/21/19  Yes Opalski, Deborah, DO  lidocaine-prilocaine (EMLA) cream Apply 1 application topically as needed (as directed). Patient taking differently:  Apply 1 application topically daily as needed (port access). 02/15/21  Yes Curcio, Roselie Awkward, NP  LORazepam (ATIVAN) 0.5 MG tablet Take 1 tablet (0.5 mg total) by mouth See admin instructions. Take 0.5 mg by mouth at bedtime nightly while hospitalized 02/15/21  Yes Curcio, Roselie Awkward, NP  magic mouthwash w/lidocaine SOLN Take 5 mLs by mouth 4 (four) times daily as needed for mouth pain. Swish and swallow 1 part diphenhydramine 12.5 mg per 5 mL elixir (41ml), 1 part Maalox (do not substitute Kaopectate) 37ml, 1 part 2% viscous lidocaine (40 ml), 1 part nystatin 500000 units/ml (58ml) and 1 part distilled water (67ml). Plz dispense 200 ml 12/16/20  Yes Brunetta Genera, MD  mirtazapine (REMERON) 15 MG tablet Take 1 tablet (15 mg total) by mouth at bedtime. 01/20/21  Yes Curcio, Roselie Awkward, NP  montelukast (SINGULAIR) 10 MG tablet Take 1 tablet (10 mg total) by mouth daily. 02/15/21  Yes Curcio, Roselie Awkward, NP  Multiple Vitamin (MULTIVITAMIN WITH MINERALS) TABS tablet Take 1 tablet by mouth daily.   Yes [provider]  NEXIUM 20 MG capsule Take 20 mg by mouth 2 (two) times daily before a meal.   Yes [provider]  polyethylene glycol (MIRALAX / GLYCOLAX) 17 g packet Take 17 g by mouth daily. 11/19/20  Yes Curcio,  Roselie Awkward, NP  senna-docusate (SENOKOT-S) 8.6-50 MG tablet Take 1 tablet by mouth 2 (two) times daily. 11/19/20  Yes Curcio, Roselie Awkward, NP  Sodium Chloride-Sodium Bicarb (SODIUM BICARBONATE/SODIUM CHLORIDE) SOLN 1 application by Mouth Rinse route See admin instructions. Use as directed three to four times daily 02/15/21  Yes Curcio, Roselie Awkward, NP  venlafaxine XR (EFFEXOR-XR) 37.5 MG 24 hr capsule Take 1 capsule (37.5 mg total) by mouth daily with breakfast. 02/15/21  Yes Curcio, Roselie Awkward, NP  Vitamin D, Ergocalciferol, (DRISDOL) 1.25 MG (50000 UNIT) CAPS capsule Take 1 capsule (50,000 Units total) by mouth every Friday. 02/15/21  Yes Curcio, Roselie Awkward, NP  famotidine (PEPCID) 20 MG tablet TAKE 1 TABLET (20 MG TOTAL) BY MOUTH 2 (TWO) TIMES DAILY AS NEEDED FOR HEARTBURN OR INDIGESTION. Patient not taking: No sig reported 02/11/21   Mauri Pole, MD  ondansetron (ZOFRAN) 8 MG tablet Take 1 tablet (8 mg total) by mouth See admin instructions. Take 8 mg by mouth two times a day starting the day after chemo for- 3 days, then, as needed, two times a day as needed for nausea or vomiting 02/15/21   Maryanna Shape, NP  prochlorperazine (COMPAZINE) 10 MG tablet Take 1 tablet (10 mg total) by mouth every 6 (six) hours as needed for vomiting or nausea. 02/15/21   Maryanna Shape, NP  prochlorperazine (COMPAZINE) 25 MG suppository Place 1 suppository (25 mg total) rectally every 12 (twelve) hours as needed for nausea. Patient not taking: Reported on 02/18/2021 11/18/20   Nicholas Lose, MD   Physical Exam: Vitals:   02/18/21 1649  BP: 124/65  Pulse: 90  Resp: 17  Temp: 98.9 F (37.2 C)  TempSrc: Oral  SpO2: 97%    General exam: Moderately built and nourished patient, lying comfortably supine on the gurney in no obvious distress. Head, eyes and ENT: Nontraumatic and normocephalic. Pupils equally reacting to light and accommodation. Oral mucosa moist. Neck: Supple. No JVD, carotid bruit or  thyromegaly. Lymphatics: No lymphadenopathy. Respiratory system: Clear to auscultation. No increased work of breathing. Cardiovascular system: S1 and S2 heard, RRR. No JVD, murmurs, gallops, clicks or pedal edema. Gastrointestinal  system: Abdomen is nondistended, soft and nontender.  Central nervous system: Alert and oriented. No focal neurological deficits. Extremities: Symmetric 5 x 5 power. Peripheral pulses symmetrically felt.  Skin: No rashes or acute findings. Musculoskeletal system: Negative exam. Psychiatry: Pleasant and cooperative.   Labs on Admission:  Basic Metabolic Panel: Recent Labs  Lab 02/14/21 0521 02/15/21 0534 02/18/21 1321  NA 138 140 137  K 2.9* 3.6 3.5  CL 104 107 102  CO2 28 28 27   GLUCOSE 84 87 99  BUN 9 10 13   CREATININE 0.44 0.49 0.58  CALCIUM 7.8* 8.5* 8.3*   Liver Function Tests: Recent Labs  Lab 02/14/21 0521 02/18/21 1321  AST 13* 18  ALT 15 30  ALKPHOS 44 61  BILITOT 0.4 0.8  PROT 4.4* 5.2*  ALBUMIN 2.4* 3.0*   No results for input(s): LIPASE, AMYLASE in the last 168 hours. No results for input(s): AMMONIA in the last 168 hours. CBC: Recent Labs  Lab 02/14/21 0521 02/18/21 1321  WBC 2.7* 0.9*  NEUTROABS 2.4 0.6*  HGB 9.0* 8.9*  HCT 28.2* 27.0*  MCV 89.2 87.4  PLT 261 118*   Cardiac Enzymes: No results for input(s): CKTOTAL, CKMB, CKMBINDEX, TROPONINI in the last 168 hours.  BNP (last 3 results) No results for input(s): PROBNP in the last 8760 hours. CBG: No results for input(s): GLUCAP in the last 168 hours.  Radiological Exams on Admission: No results found.    Assessment/Plan Principal Problem:   Neutropenic fever ([facility]) Active Problems:   Allergic rhinitis   HTN (hypertension)   Hyperlipemia, mixed   Hypothyroidism   GERD (gastroesophageal reflux disease)   Vitamin D deficiency   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HCC)   B12 deficiency   Iron deficiency anemia due to chronic blood loss  1-Neutropenic  fever: -Patient presents with fever, white blood cell count 0.9 in the setting of recent chemotherapy -She reported that she received granulocyte stimulator factor 2  days ago -Plan to proceed with panculture: blood cultures UA, urine culture, check for C. difficile in the setting of diarrhea, chest x-ray. -Start empiric antibiotic vancomycin and cefepime  2-Large B-cell lymphoma; Follow by oncology, Dr Lorenso Courier.  Undergoing chemotherapy  3-Hypothyroidism: Continue with Synthroid.  4-B12 deficiency: Continue with B12 injections 5-GERD: Continue with PPI 6-Asthma; continue with inhaler.  7-Insomnia; Continue with Remeron.       DVT Prophylaxis: Lovenox, monitor platelets count Code Status: clarification, patient wishes to be Full Code Family Communication: Care discussed with patient Disposition Plan: Admit inpatient patient admitted with neutropenic fever  Time spent: 75 minutes.    Elmarie Shiley MD [facility]   02/18/2021, 6:02 PM

## 2021-02-18 NOTE — Progress Notes (Addendum)
.  Pharmacy Antibiotic Note  Lindsey Price is a 75 y.o. female with large B-cell lymphoma, receiving EPOCH chemotherapy, admitted on 02/18/2021 with neutropenic fever.  Pharmacy has been consulted for cefepime and vancomycin dosing.  Plan: Cefepime 2 grams IV q12h Vancomycin 1 gram IV x 1, then 1 gram IV q24h  (Estimated AUC 518) Follow up renal function, culture results, and clinical course.      Temp (24hrs), Avg:99.8 F (37.7 C), Min:98.9 F (37.2 C), Max:100.6 F (38.1 C)  Recent Labs  Lab 02/14/21 0521 02/15/21 0534 02/18/21 1321  WBC 2.7*  --  0.9*  CREATININE 0.44 0.49 0.58    Estimated Creatinine Clearance: 52.6 mL/min (by C-G formula based on SCr of 0.58 mg/dL).    Allergies  Allergen Reactions   Advair Diskus [Fluticasone-Salmeterol] Other (See Comments)    Hoarseness   Neosporin [Neomycin-Polymyxin-Gramicidin] Itching    Antimicrobials this admission: Cefepime 6/17 >> Vancomycin 6/17>>  Dose adjustments this admission:   Microbiology results: BCx 6/17: ordered UCx 6/17: ordered   Thank you for allowing pharmacy to be a part of this patient's care.  Clayburn Pert, PharmD, BCPS 02/18/2021  5:41 PM

## 2021-02-18 NOTE — Progress Notes (Signed)
Patient went from a 0 green MEWS to a 4 red MEWS. Patient stated she feels fine and did not even realized that she had a slight temperature. Patient is calm, resting and does not have any changes from assessment earlier in the shift or from first assessment of the day. Patient is placed on the RED MEWS protocol. MD on call (X.Blount) has been notified of current score and condition. No new orders at this time. Will continue to monitor Patient

## 2021-02-19 DIAGNOSIS — R5081 Fever presenting with conditions classified elsewhere: Secondary | ICD-10-CM

## 2021-02-19 DIAGNOSIS — D709 Neutropenia, unspecified: Secondary | ICD-10-CM | POA: Diagnosis not present

## 2021-02-19 LAB — DIFFERENTIAL
Abs Immature Granulocytes: 0.03 10*3/uL (ref 0.00–0.07)
Basophils Absolute: 0 10*3/uL (ref 0.0–0.1)
Basophils Relative: 1 %
Eosinophils Absolute: 0 10*3/uL (ref 0.0–0.5)
Eosinophils Relative: 3 %
Immature Granulocytes: 4 %
Lymphocytes Relative: 23 %
Lymphs Abs: 0.2 10*3/uL — ABNORMAL LOW (ref 0.7–4.0)
Monocytes Absolute: 0.3 10*3/uL (ref 0.1–1.0)
Monocytes Relative: 46 %
Neutro Abs: 0.2 10*3/uL — CL (ref 1.7–7.7)
Neutrophils Relative %: 23 %

## 2021-02-19 LAB — CBC
HCT: 26 % — ABNORMAL LOW (ref 36.0–46.0)
Hemoglobin: 8.4 g/dL — ABNORMAL LOW (ref 12.0–15.0)
MCH: 29.1 pg (ref 26.0–34.0)
MCHC: 32.3 g/dL (ref 30.0–36.0)
MCV: 90 fL (ref 80.0–100.0)
Platelets: 109 10*3/uL — ABNORMAL LOW (ref 150–400)
RBC: 2.89 MIL/uL — ABNORMAL LOW (ref 3.87–5.11)
RDW: 17.7 % — ABNORMAL HIGH (ref 11.5–15.5)
WBC: 0.7 10*3/uL — CL (ref 4.0–10.5)
nRBC: 0 % (ref 0.0–0.2)

## 2021-02-19 LAB — COMPREHENSIVE METABOLIC PANEL
ALT: 24 U/L (ref 0–44)
AST: 14 U/L — ABNORMAL LOW (ref 15–41)
Albumin: 2.6 g/dL — ABNORMAL LOW (ref 3.5–5.0)
Alkaline Phosphatase: 54 U/L (ref 38–126)
Anion gap: 5 (ref 5–15)
BUN: 11 mg/dL (ref 8–23)
CO2: 27 mmol/L (ref 22–32)
Calcium: 8 mg/dL — ABNORMAL LOW (ref 8.9–10.3)
Chloride: 104 mmol/L (ref 98–111)
Creatinine, Ser: 0.62 mg/dL (ref 0.44–1.00)
GFR, Estimated: >60 mL/min (ref >60)
Glucose, Bld: 89 mg/dL (ref 70–99)
Potassium: 3.1 mmol/L — ABNORMAL LOW (ref 3.5–5.1)
Sodium: 136 mmol/L (ref 135–145)
Total Bilirubin: 1 mg/dL (ref 0.3–1.2)
Total Protein: 4.8 g/dL — ABNORMAL LOW (ref 6.5–8.1)

## 2021-02-19 LAB — SARS CORONAVIRUS 2 (TAT 6-24 HRS): SARS Coronavirus 2: NEGATIVE

## 2021-02-19 MED ORDER — POTASSIUM CHLORIDE CRYS ER 20 MEQ PO TBCR
40.0000 meq | EXTENDED_RELEASE_TABLET | Freq: Once | ORAL | Status: AC
  Administered 2021-02-19: 40 meq via ORAL
  Filled 2021-02-19: qty 2

## 2021-02-19 MED ORDER — POTASSIUM CHLORIDE CRYS ER 20 MEQ PO TBCR
20.0000 meq | EXTENDED_RELEASE_TABLET | Freq: Once | ORAL | Status: AC
  Administered 2021-02-19: 20 meq via ORAL
  Filled 2021-02-19: qty 1

## 2021-02-19 MED ORDER — CHLORHEXIDINE GLUCONATE CLOTH 2 % EX PADS
6.0000 | MEDICATED_PAD | Freq: Every day | CUTANEOUS | Status: DC
  Administered 2021-02-20 – 2021-02-22 (×3): 6 via TOPICAL

## 2021-02-19 MED ORDER — POLYETHYLENE GLYCOL 3350 17 G PO PACK
17.0000 g | PACK | Freq: Every day | ORAL | Status: DC
  Administered 2021-02-19 – 2021-02-20 (×2): 17 g via ORAL
  Filled 2021-02-19 (×3): qty 1

## 2021-02-19 MED ORDER — LORATADINE 10 MG PO TABS
10.0000 mg | ORAL_TABLET | Freq: Every day | ORAL | Status: DC
  Administered 2021-02-19 – 2021-02-22 (×4): 10 mg via ORAL
  Filled 2021-02-19 (×4): qty 1

## 2021-02-19 NOTE — Progress Notes (Signed)
Resident went from a RED mews to yellow and is now currently green. Will now switch to Yellow MEWS protocol. Resident is resting, stable, and doing just fine

## 2021-02-19 NOTE — Progress Notes (Signed)
PROGRESS NOTE    Lindsey Price  XBL:[id number] DOB: Feb 18, 1946 DOA: 02/18/2021 PCP: Leonides Sake, MD   Brief Narrative: 75 year old with past medical history significant for large B-cell lymphoma undergoing chemo, history of breast cancer, hypertension, hypothyroidism who presents as a direct admission from oncology office for further evaluation of neutropenic fever. She was found to have to 100.6 white blood cells 0.9, neutrophil count 600, platelets 118.  Patient admitted for neutropenic fever.  No focal symptoms or signs of infection.   Assessment & Plan:   Principal Problem:   Neutropenic fever ([place]) Active Problems:   Allergic rhinitis   HTN (hypertension)   Hyperlipemia, mixed   Hypothyroidism   GERD (gastroesophageal reflux disease)   Vitamin D deficiency   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HCC)   B12 deficiency   Iron deficiency anemia due to chronic blood loss  1-Neutropenic Fever:  -Patient Presents with Fever, WBC 0.9 in setting of recent chemo.  -Chest x-ray negative for pneumonia, UA negative. -C diff negative.  -Follow blood cultures, urine culture* -Continue with vancomycin and cefepime  2--Large B-cell lymphoma; Follow by oncology, Dr.  Lorenso Courier.  Undergoing chemotherapy.    3-Hypothyroidism: Continue with Synthroid.   4-B12 deficiency: Continue with B12 injections. 5-GERD: Continue with PPI 6-Asthma; Continue with inhaler. 7-Insomnia; Continue with Remeron. 8-History of SBO:  She had a CT abdomen 02/11/2021 that show fairly high-grade small bowel obstruction which appears to be due to a very irregular masslike area of his scaring changes or treated tumor in the right lower quadrant with surrounding fibrosis and adhesions.  Resume miralax.  Hypokalemia; replete orally.    Estimated body mass index is 19.8 kg/m as calculated from the following:   Height as of an earlier encounter on 02/18/21: 5\' 6"  (1.676 m).   Weight as of an earlier encounter on  02/18/21: 55.7 kg.   DVT prophylaxis: Lovenox Code Status: Full code Family Communication: care discussed with patient Disposition Plan:  Status is: Inpatient  Remains inpatient appropriate because:IV treatments appropriate due to intensity of illness or inability to take PO  Dispo: The patient is from: Home              Anticipated d/c is to: Home              Patient currently is not medically stable to d/c.   Difficult to place patient No        Consultants:  Oncology   Procedures:  None  Antimicrobials:    Subjective: No new symptoms. Denies abdominal pain   Objective: Vitals:   02/19/21 0306 02/19/21 0447 02/19/21 0943 02/19/21 1444  BP: 111/61 138/74 (!) 121/50 126/66  Pulse: 84 67 88 96  Resp: 20 20 17 18   Temp: 98 F (36.7 C) 99 F (37.2 C) 98.1 F (36.7 C) 100 F (37.8 C)  TempSrc: Oral Oral Oral Oral  SpO2: 94% 98% 94% 92%    Intake/Output Summary (Last 24 hours) at 02/19/2021 1529 Last data filed at 02/19/2021 1300 Gross per 24 hour  Intake 1967.08 ml  Output 250 ml  Net 1717.08 ml   There were no vitals filed for this visit.  Examination:  General exam: Appears calm and comfortable  Respiratory system: Clear to auscultation. Respiratory effort normal. Cardiovascular system: S1 & S2 heard, RRR. No JVD, murmurs, rubs, gallops or clicks. No pedal edema. Gastrointestinal system: Abdomen is nondistended, soft and nontender. No organomegaly or masses felt. Normal bowel sounds heard. Central nervous system: Alert and  oriented. No focal neurological deficits. Extremities: Symmetric 5 x 5 power. Skin: No rashes, lesions or ulcers Psychiatry: Judgement and insight appear normal. Mood & affect appropriate.     Data Reviewed: I have personally reviewed following labs and imaging studies  CBC: Recent Labs  Lab 02/14/21 0521 02/18/21 1321 02/19/21 0524  WBC 2.7* 0.9* 0.7*  NEUTROABS 2.4 0.6* 0.2*  HGB 9.0* 8.9* 8.4*  HCT 28.2* 27.0* 26.0*   MCV 89.2 87.4 90.0  PLT 261 118* 694*   Basic Metabolic Panel: Recent Labs  Lab 02/14/21 0521 02/15/21 0534 02/18/21 1321 02/19/21 0524  NA 138 140 137 136  K 2.9* 3.6 3.5 3.1*  CL 104 107 102 104  CO2 28 28 27 27   GLUCOSE 84 87 99 89  BUN 9 10 13 11   CREATININE 0.44 0.49 0.58 0.62  CALCIUM 7.8* 8.5* 8.3* 8.0*   GFR: Estimated Creatinine Clearance: 52.6 mL/min (by C-G formula based on SCr of 0.62 mg/dL). Liver Function Tests: Recent Labs  Lab 02/14/21 0521 02/18/21 1321 02/19/21 0524  AST 13* 18 14*  ALT 15 30 24   ALKPHOS 44 61 54  BILITOT 0.4 0.8 1.0  PROT 4.4* 5.2* 4.8*  ALBUMIN 2.4* 3.0* 2.6*   No results for input(s): LIPASE, AMYLASE in the last 168 hours. No results for input(s): AMMONIA in the last 168 hours. Coagulation Profile: No results for input(s): INR, PROTIME in the last 168 hours. Cardiac Enzymes: No results for input(s): CKTOTAL, CKMB, CKMBINDEX, TROPONINI in the last 168 hours. BNP (last 3 results) No results for input(s): PROBNP in the last 8760 hours. HbA1C: No results for input(s): HGBA1C in the last 72 hours. CBG: No results for input(s): GLUCAP in the last 168 hours. Lipid Profile: No results for input(s): CHOL, HDL, LDLCALC, TRIG, CHOLHDL, LDLDIRECT in the last 72 hours. Thyroid Function Tests: No results for input(s): TSH, T4TOTAL, FREET4, T3FREE, THYROIDAB in the last 72 hours. Anemia Panel: No results for input(s): VITAMINB12, FOLATE, FERRITIN, TIBC, IRON, RETICCTPCT in the last 72 hours. Sepsis Labs: No results for input(s): PROCALCITON, LATICACIDVEN in the last 168 hours.  Recent Results (from the past 240 hour(s))  C Difficile Quick Screen w PCR reflex     Status: Abnormal   Collection Time: 02/18/21  6:10 PM   Specimen: STOOL  Result Value Ref Range Status   C Diff antigen NON REACTIVE (A) NEGATIVE Final   C Diff toxin NON REACTIVE (A) NEGATIVE Final   C Diff interpretation NEGATIVE  Final    Comment: Performed at [facility], [place] [address] [place], [place] [address]  SARS CORONAVIRUS 2 (TAT 6-24 HRS) Nasopharyngeal STOOL     Status: None   Collection Time: 02/18/21  6:10 PM   Specimen: STOOL; Nasopharyngeal  Result Value Ref Range Status   SARS Coronavirus 2 NEGATIVE NEGATIVE Final    Comment: (NOTE) SARS-CoV-2 target nucleic acids are NOT DETECTED.  The SARS-CoV-2 RNA is generally detectable in upper and lower respiratory specimens during the acute phase of infection. Negative results do not preclude SARS-CoV-2 infection, do not rule out co-infections with other pathogens, and should not be used as the sole basis for treatment or other patient management decisions. Negative results must be combined with clinical observations, patient history, and epidemiological information. The expected result is Negative.  Fact Sheet for Patients: SugarRoll.be  Fact Sheet for Healthcare Providers: https://www.woods-mathews.com/  This test is not yet approved or cleared by the [place] FDA and  has been  authorized for detection and/or diagnosis of SARS-CoV-2 by FDA under an Emergency Use Authorization (EUA). This EUA will remain  in effect (meaning this test can be used) for the duration of the COVID-19 declaration under Se ction 564(b)(1) of the Act, 21 U.S.C. section 360bbb-3(b)(1), unless the authorization is terminated or revoked sooner.  Performed at [facility], [place] [address] [place], [place] [address]          Radiology Studies: DG Chest 2 View  Result Date: 02/18/2021 CLINICAL DATA:  History of asthma and lymphoma EXAM: CHEST - 2 VIEW COMPARISON:  05/07/2020 FINDINGS: Cardiac shadow is within normal limits. Right-sided chest wall port is noted in satisfactory position. Minimal scarring is noted in the bases bilaterally. No focal infiltrate or effusion is seen. Degenerative change of the thoracic spine is noted.  IMPRESSION: No acute abnormality noted. Electronically Signed   By: Inez Catalina M.D.   On: 02/18/2021 19:25        Scheduled Meds:  atorvastatin  10 mg Oral QHS   [START ON 02/28/2021] cyanocobalamin  1,000 mcg Subcutaneous Q30 days   enoxaparin (LOVENOX) injection  40 mg Subcutaneous Q24H   ezetimibe  10 mg Oral QHS   fluticasone  2 spray Each Nare Daily   [START ON 02/20/2021] levothyroxine  100 mcg Oral Q48H   levothyroxine  88 mcg Oral Q48H   loratadine  10 mg Oral Daily   LORazepam  0.5 mg Oral QHS   mirtazapine  15 mg Oral QHS   montelukast  10 mg Oral Daily   multivitamin with minerals  1 tablet Oral Daily   polyethylene glycol  17 g Oral Daily   venlafaxine XR  37.5 mg Oral Q breakfast   Continuous Infusions:  ceFEPime (MAXIPIME) IV Stopped (02/19/21 0600)   lactated ringers 100 mL/hr at 02/19/21 0610   vancomycin       LOS: 1 day    Time spent: 35 minutes.     Elmarie Shiley, MD [facility]   If 7PM-7AM, please contact night-coverage [email or web address]  02/19/2021, 3:29 PM

## 2021-02-19 NOTE — Progress Notes (Signed)
   02/19/21 1907  Assess: MEWS Score  Temp (!) 102.6 F (39.2 C)  BP 128/71  Pulse Rate (!) 107  Resp 18  SpO2 92 %  Assess: MEWS Score  MEWS Temp 2  MEWS Systolic 0  MEWS Pulse 1  MEWS RR 0  MEWS LOC 0  MEWS Score 3  MEWS Score Color Yellow  Assess: if the MEWS score is Yellow or Red  Were vital signs taken at a resting state? Yes  Focused Assessment No change from prior assessment  Does the patient meet 2 or more of the SIRS criteria? Yes  Does the patient have a confirmed or suspected source of infection? Yes  Provider and Rapid Response Notified? No  MEWS guidelines implemented *See Row Information* No, previously yellow, continue vital signs every 4 hours  Take Vital Signs  Increase Vital Sign Frequency  Yellow: Q 2hr X 2 then Q 4hr X 2, if remains yellow, continue Q 4hrs  Escalate  MEWS: Escalate Yellow: discuss with charge nurse/RN and consider discussing with provider and RRT (discussed with provider eariler in the day, will not change orders if has additional fever)  Notify: Charge Nurse/RN  Name of Charge Nurse/RN Notified Colletta [place]  Date Charge Nurse/RN Notified 02/19/21  Time Charge Nurse/RN Notified 1920  Document  Patient Outcome Other (Comment) (stable no changes)  Progress note created (see row info) Yes  Assess: SIRS CRITERIA  SIRS Temperature  1  SIRS Pulse 1  SIRS Respirations  0  SIRS WBC 0  SIRS Score Sum  2

## 2021-02-19 NOTE — Progress Notes (Signed)
HEMATOLOGY-ONCOLOGY PROGRESS NOTE  SUBJECTIVE: This morning Lindsey Price is accompanied by her husband.  She reports that she did well overnight with the exception of spiking a temperature of 103 F.  She notes that she continues to have no focal symptoms such as sinus pain, headache, nausea, vomiting, or diarrhea.  Culture data so far has been negative.  She is otherwise in good spirits and willing to stay until her immune system recovers.  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 Biopsy showed invasive and in situ ductal carcinoma, HER-2 equivocal by IHC (2+), negative by FISH (ratio 1.59), ER+ >95%, PR+ 85%, Ki67 10%.    Malignant neoplasm of left breast in female, estrogen receptor positive ([place])  10/12/2020 Initial Diagnosis   Malignant neoplasm of left breast in female, estrogen receptor positive ([place])    10/12/2020 Cancer Staging   Staging form: Breast, AJCC 8th Edition - Clinical stage from 10/12/2020: Stage IA (cT1b, cN0, cM0, G2, ER+, PR+, HER2-) - Signed by Nicholas Lose, MD on 10/15/2020  Stage prefix: Initial diagnosis    11/05/2020 Surgery   Left lumpectomy: Grade 1 IDC, 1.1 cm, intermediate grade DCIS, margins are negative, lymph node -0/1, ER greater than 95%, PR 85%, HER-2 negative, Ki-67 10%   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[place])  11/11/2020 Initial Diagnosis   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[place])    11/15/2020 -  Chemotherapy    Patient is on Treatment Plan: IP NON-HODGKINS LYMPHOMA EPOCH Q21D   Patient is on Antibody Plan: NON-HODGKINS LYMPHOMA RITUXIMAB  Q21D     11/19/2020 -  Chemotherapy    Patient is on Treatment Plan: IP NON-HODGKINS LYMPHOMA EPOCH Q21D   Patient is on Antibody Plan: NON-HODGKINS LYMPHOMA RITUXIMAB Q21D        REVIEW OF SYSTEMS:   Constitutional: Denies fevers, chills  Eyes: Denies blurriness of vision Ears, nose, mouth, throat, and face: Denies mucositis or sore throat Respiratory: Denies cough, dyspnea or wheezes Cardiovascular: Denies palpitation, chest discomfort Gastrointestinal: Reports improvement in her abdominal pain and abdominal distention, reports diarrhea, denies nausea and vomiting Skin: Denies abnormal skin rashes Lymphatics: Denies new lymphadenopathy or easy bruising Neurological: Denies dizziness and headache Behavioral/Psych: Mood is stable, no new changes  Extremities: No lower extremity edema All other systems were reviewed with the patient and are negative.  I have reviewed the past medical history, past surgical history, social history and family history with the patient and they are unchanged from previous note.   PHYSICAL EXAMINATION: ECOG PERFORMANCE STATUS: 1 - Symptomatic but completely ambulatory  Vitals:   02/19/21 0447 02/19/21 0943  BP: 138/74 (!) 121/50  Pulse: 67 88  Resp: 20 17  Temp: 99 F (37.2 C) 98.1 F (36.7 C)  SpO2: 98% 94%   There were no vitals filed for this visit.  Intake/Output from previous day: 06/17 0701 - 06/18 0700 In: 1732.1 [P.O.:355; I.V.:1177.1; IV Piggyback:200] Out: 250 [Urine:250]  GENERAL:alert, no distress and comfortable SKIN: skin color, texture, turgor are normal, no rashes or significant lesions EYES: normal, Conjunctiva are pink  and non-injected, sclera clear OROPHARYNX:no exudate, no erythema and lips, buccal mucosa, and tongue normal  LUNGS: clear to auscultation and percussion with normal breathing effort HEART: regular rate & rhythm and no murmurs and +1 pitting lower extremity edema ABDOMEN: Markedly improved, no tympany,  normal bowel sounds. Musculoskeletal:no cyanosis of digits and no clubbing  NEURO: alert & oriented x 3 with fluent speech, no focal motor/sensory deficits  LABORATORY DATA:  I have reviewed the data as listed CMP Latest Ref Rng & Units 02/19/2021 02/18/2021 02/15/2021  Glucose 70 - 99 mg/dL 89 99 87  BUN 8 - 23 mg/dL _0 Creatinine 0.44 - 1.00 mg/dL 0.62 0.58 0.49  Sodium 135 - 145 mmol/L 136 137 140  Potassium 3.5 - 5.1 mmol/L 3.1(L) 3.5 3.6  Chloride 98 - 111 mmol/L 104 102 107  CO2 22 - 32 mmol/L _1 Calcium 8.9 - 10.3 mg/dL 8.0(L) 8.3(L) 8.5(L)  Total Protein 6.5 - 8.1 g/dL 4.8(L) 5.2(L) -  Total Bilirubin 0.3 - 1.2 mg/dL 1.0 0.8 -  Alkaline Phos 38 - 126 U/L 54 61 -  AST 15 - 41 U/L 14(L) 18 -  ALT 0 - 44 U/L 24 30 -    Lab Results  Component Value Date   WBC 0.7 (LL) 02/19/2021   HGB 8.4 (L) 02/19/2021   HCT 26.0 (L) 02/19/2021   MCV 90.0 02/19/2021   PLT 109 (L) 02/19/2021   NEUTROABS 0.2 (LL) 02/19/2021    DG Chest 2 View  Result Date: 02/18/2021 CLINICAL DATA:  History of asthma and lymphoma EXAM: CHEST - 2 VIEW COMPARISON:  05/07/2020 FINDINGS: Cardiac shadow is within normal limits. Right-sided chest wall port is noted in satisfactory position. Minimal scarring is noted in the bases bilaterally. No focal infiltrate or effusion is seen. Degenerative change of the thoracic spine is noted. IMPRESSION: No acute abnormality noted. Electronically Signed   By: Inez Catalina M.D.   On: 02/18/2021 19:25   CT ABDOMEN PELVIS W CONTRAST  Result Date: 02/11/2021 CLINICAL DATA:  History of large B-cell lymphoma involving the cecum and distal ileum. EXAM: CT ABDOMEN AND PELVIS WITH CONTRAST TECHNIQUE: Multidetector CT imaging of the abdomen and pelvis was performed using the standard protocol following bolus administration of intravenous contrast. CONTRAST:  65m OMNIPAQUE IOHEXOL 300 MG/ML  SOLN COMPARISON:  CT scan 10/21/2020 FINDINGS: Lower chest: Very small bilateral  pleural effusions with minimal overlying atelectasis. No worrisome pulmonary lesions. Probable radiation changes involving the anterior aspect of the right lung and there are surgical changes involving the right breast. The heart is within normal limits in size. No pericardial effusion. Hepatobiliary: No hepatic lesions are identified. Gallbladder is slightly distended no definite gallstones or findings for acute cholecystitis. No common bile duct dilatation. Pancreas: No mass, inflammation or ductal dilatation. Spleen: Normal size. No focal lesions. Adrenals/Urinary Tract: Adrenal glands and kidneys are unremarkable. The bladder is unremarkable. Stomach/Bowel: The stomach and duodenum are unremarkable. There is a fairly high-grade small-bowel obstruction noted with markedly dilated small bowel loops and air-fluid/air contrast levels throughout. The colon is decompressed. The small-bowel obstruction appears to be at the distal ileum. There is a very irregular masslike area of scarring changes, fibrosis or treated lymphoid tissue in the right lower quadrant. Surrounding adhesions or scarring in this area. The cecum now appears normal. No residual bowel wall thickening. Vascular/Lymphatic: Stable atherosclerotic calcifications involving the aorta and iliac arteries but no aneurysm. No mesenteric or retroperitoneal mass or adenopathy. Reproductive:  Surgically absent. Other: Small amount of free abdominal/free pelvic fluid likely related to the patient has small-bowel obstruction. Musculoskeletal: No significant bony findings. IMPRESSION: 1. Fairly high-grade small-bowel obstruction as detailed above. This appears to be due to a very irregular masslike area of scarring change or treated tumor in the right lower quadrant with surrounding fibrosis and adhesions. 2. No residual bowel wall thickening to suggest persistent lymphoma involving the cecum or terminal ileum. 3. Small amount of free abdominal/free pelvic fluid  likely related to the small-bowel obstruction. 4. Very small bilateral pleural effusions with minimal overlying atelectasis. These results will be called to the ordering clinician or representative by the Radiologist Assistant, and communication documented in the PACS or [employer]. Aortic Atherosclerosis (ICD10-I70.0). Electronically Signed   By: Marijo Sanes M.D.   On: 02/11/2021 19:37   DG Abd 2 Views  Result Date: 02/13/2021 CLINICAL DATA:  Small-bowel obstruction on CT EXAM: ABDOMEN - 2 VIEW COMPARISON:  CT of 2 days ago FINDINGS: Supine and upright views. The upright view demonstrates no free intraperitoneal air. Right hemidiaphragm elevation. Cardiomegaly. No upper abdominal air-fluid levels. The supine view demonstrates normal caliber gas-filled colon. Small bowel loops measure up to 4.0 cm, minimally decreased compared to the prior CT. IMPRESSION: Improvement in small-bowel obstruction pattern. No free intraperitoneal air or other acute complication. Electronically Signed   By: Abigail Miyamoto M.D.   On: 02/13/2021 10:16    ASSESSMENT AND PLAN: 1) large B-cell lymphoma -CT abdomen/pelvis with contrast 10/21/2020 - "Acute colitis/enteritis of the cecum/ascending colon and the associated terminal loops of ileum. As was recently described on CT imaging, the anatomy of distal ileum, ileocecal valve, cecum is atypical and correlation with patient's prior surgical record would be useful. The acute inflammatory changes contribute to at least a partial small bowel obstruction. Primary concern of this inflammatory cystic mass/bowel of the right lower quadrant is malignancy given that the posterior margin is inseparable from the psoas muscle, adnexa, and the right ureter. Correlation with CEA may be useful. Inflammatory/infectious changes are also a consideration." -Colonoscopy performed 11/04/2020- "An infiltrative and ulcerated partially obstructing large mass was found at 85 cm proximal to the anus. 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  Total white count is low at 0.7 with ANC 0.2.  Hemoglobin stable at 8.4.  Platelets at 109.  Potassium down to 3.1 this morning.   --temperature max of 103 F last night. No temperature spike today.  --transfusion for Hgb <7.0 (or <8.0 and symptomatic) or Plt <10 -continue Cefepime/Vancomycin empirically for neutropenic fever -culture data so far unrevealing. No focal symptoms.  -Patient received Udenyca injection on Wednesday of last week. No indication for repeat dosing.  -Continue as needed antiemetics. -Lovenox for DVT prophylaxis. Hold if Plt <50 -Continue home medications. -Continue PPI and as needed Maalox for  reflux symptoms. --will consider d/c if ANC is >1.0 and trending upward with no fever x 24-48 hours.   Ledell Peoples, MD Department of Hematology/Oncology [place] at [facility] Phone: [phone] Pager: [phone] Email: [name]    LOS: 1 day

## 2021-02-19 NOTE — Progress Notes (Signed)
Critical results Absolute Neut. 0.2 relayed to Dr Tyrell Antonio. No new orders.

## 2021-02-20 ENCOUNTER — Encounter (HOSPITAL_COMMUNITY): Payer: Self-pay | Admitting: Internal Medicine

## 2021-02-20 DIAGNOSIS — R5081 Fever presenting with conditions classified elsewhere: Secondary | ICD-10-CM | POA: Diagnosis not present

## 2021-02-20 DIAGNOSIS — D709 Neutropenia, unspecified: Secondary | ICD-10-CM | POA: Diagnosis not present

## 2021-02-20 LAB — BASIC METABOLIC PANEL
Anion gap: 6 (ref 5–15)
BUN: 9 mg/dL (ref 8–23)
CO2: 27 mmol/L (ref 22–32)
Calcium: 7.7 mg/dL — ABNORMAL LOW (ref 8.9–10.3)
Chloride: 101 mmol/L (ref 98–111)
Creatinine, Ser: 0.44 mg/dL (ref 0.44–1.00)
GFR, Estimated: >60 mL/min (ref >60)
Glucose, Bld: 84 mg/dL (ref 70–99)
Potassium: 2.8 mmol/L — ABNORMAL LOW (ref 3.5–5.1)
Sodium: 134 mmol/L — ABNORMAL LOW (ref 135–145)

## 2021-02-20 LAB — CBC WITH DIFFERENTIAL/PLATELET
Abs Immature Granulocytes: 0 10*3/uL (ref 0.00–0.07)
Basophils Absolute: 0 10*3/uL (ref 0.0–0.1)
Basophils Relative: 1 %
Eosinophils Absolute: 0 10*3/uL (ref 0.0–0.5)
Eosinophils Relative: 2 %
HCT: 24.4 % — ABNORMAL LOW (ref 36.0–46.0)
Hemoglobin: 8 g/dL — ABNORMAL LOW (ref 12.0–15.0)
Immature Granulocytes: 0 %
Lymphocytes Relative: 12 %
Lymphs Abs: 0.2 10*3/uL — ABNORMAL LOW (ref 0.7–4.0)
MCH: 29.1 pg (ref 26.0–34.0)
MCHC: 32.8 g/dL (ref 30.0–36.0)
MCV: 88.7 fL (ref 80.0–100.0)
Monocytes Absolute: 0.8 10*3/uL (ref 0.1–1.0)
Monocytes Relative: 62 %
Neutro Abs: 0.3 10*3/uL — CL (ref 1.7–7.7)
Neutrophils Relative %: 23 %
Platelets: 123 10*3/uL — ABNORMAL LOW (ref 150–400)
RBC: 2.75 MIL/uL — ABNORMAL LOW (ref 3.87–5.11)
RDW: 17.6 % — ABNORMAL HIGH (ref 11.5–15.5)
WBC: 1.3 10*3/uL — CL (ref 4.0–10.5)
nRBC: 0 % (ref 0.0–0.2)

## 2021-02-20 LAB — URINE CULTURE: Culture: NO GROWTH

## 2021-02-20 MED ORDER — ENSURE ENLIVE PO LIQD
237.0000 mL | ORAL | Status: DC
  Administered 2021-02-21 – 2021-02-22 (×2): 237 mL via ORAL

## 2021-02-20 MED ORDER — POTASSIUM CHLORIDE 10 MEQ/100ML IV SOLN
10.0000 meq | INTRAVENOUS | Status: AC
  Administered 2021-02-20 (×3): 10 meq via INTRAVENOUS
  Filled 2021-02-20 (×2): qty 100

## 2021-02-20 MED ORDER — POTASSIUM CHLORIDE CRYS ER 20 MEQ PO TBCR
40.0000 meq | EXTENDED_RELEASE_TABLET | ORAL | Status: AC
  Administered 2021-02-20 (×2): 40 meq via ORAL
  Filled 2021-02-20 (×2): qty 2

## 2021-02-20 MED ORDER — POTASSIUM CHLORIDE 10 MEQ/100ML IV SOLN
INTRAVENOUS | Status: AC
  Filled 2021-02-20: qty 100

## 2021-02-20 MED ORDER — ENSURE ENLIVE PO LIQD: 237.0000 mL | Freq: Two times a day (BID) | ORAL | Status: DC

## 2021-02-20 MED ORDER — SODIUM BICARBONATE/SODIUM CHLORIDE MOUTHWASH
OROMUCOSAL | Status: DC | PRN
  Filled 2021-02-20: qty 1000

## 2021-02-20 NOTE — Progress Notes (Signed)
IP PROGRESS NOTE  Subjective:   Lindsey Price reports feeling well without any major complaints.  She did have a high temperature of 102.7 last night but resolved without any issues this morning.  She feels well overall.  He denies any chills or sweats.  Appetite still marginal however.  Objective:  Vital signs in last 24 hours: Temp:  [97.5 F (36.4 C)-102.6 F (39.2 C)] 99.7 F (37.6 C) (06/19 0746) Pulse Rate:  [78-108] 108 (06/19 0746) Resp:  [16-20] 20 (06/19 0746) BP: (111-134)/(50-71) 134/70 (06/19 0746) SpO2:  [92 %-96 %] 93 % (06/19 0746) Weight change:  Last BM Date: 02/19/21  Intake/Output from previous day: 06/18 0701 - 06/19 0700 In: 2068.7 [P.O.:475; I.V.:1293.7; IV Piggyback:300] Out: -  General: Alert, awake without distress. Head: Normocephalic atraumatic. Mouth: mucous membranes moist, pharynx normal without lesions Eyes: No scleral icterus.  Pupils are equal and round reactive to light. Resp: clear to auscultation bilaterally without rhonchi or wheezes or dullness to percussion. Cardio: regular rate and rhythm, S1, S2 normal, no murmur, click, rub or gallop GI: soft, non-tender; bowel sounds normal; no masses,  no organomegaly Musculoskeletal: No joint deformity or effusion. Neurological: No motor, sensory deficits.  Intact deep tendon reflexes. Skin: No rashes or lesions.  Lab Results: Recent Labs    02/19/21 0524 02/20/21 0628  WBC 0.7* 1.3*  HGB 8.4* 8.0*  HCT 26.0* 24.4*  PLT 109* 123*    BMET Recent Labs    02/19/21 0524 02/20/21 0628  NA 136 134*  K 3.1* 2.8*  CL 104 101  CO2 27 27  GLUCOSE 89 84  BUN 11 9  CREATININE 0.62 0.44  CALCIUM 8.0* 7.7*    Studies/Results: DG Chest 2 View  Result Date: 02/18/2021 CLINICAL DATA:  History of asthma and lymphoma EXAM: CHEST - 2 VIEW COMPARISON:  05/07/2020 FINDINGS: Cardiac shadow is within normal limits. Right-sided chest wall port is noted in satisfactory position. Minimal scarring is  noted in the bases bilaterally. No focal infiltrate or effusion is seen. Degenerative change of the thoracic spine is noted. IMPRESSION: No acute abnormality noted. Electronically Signed   By: Inez Catalina M.D.   On: 02/18/2021 19:25    Medications: I have reviewed the patient's current medications.  Assessment/Plan:  1.  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  She has been receiving chemotherapy in the form of R-EPOCH with the last cycle completed on 02/15/2021.  Imaging studies in June 2022 showed improvement in her disease.  2.  Neutropenic fever: Cultures remain negative at this time and she still has periodic fever.  Her white cell count has improved at this time currently at 1.3 although her ANC remains low at 300.  For the time being, I recommended continued broad-spectrum antibiotic till her [place] is above 500 and cultures are negative.  I anticipate her white cell count neutrophils will be above 500 in the next 24 hours.  Her fever is likely more of count recovery rather than infectious at this time.   3.  Disposition: I anticipate that she will be ready for discharge in the next 24 hours if no setbacks are noted.  4.  Anemia: Related to her lymphoma on chemotherapy.  No need for transfusion at this time with a hemoglobin of 8.0.   25  minutes were dedicated to this visit.  50% of the time was face-to-face and the time was spent on reviewing laboratory data, imaging studies, discussing management plans and answering questions regarding  future plan.        LOS: 2 days   Zola Button 02/20/2021, 8:33 AM

## 2021-02-20 NOTE — Progress Notes (Signed)
PROGRESS NOTE    Lindsey Price  DVV:[id number] DOB: 1946-05-23 DOA: 02/18/2021 PCP: Leonides Sake, MD   Brief Narrative: 75 year old with past medical history significant for large B-cell lymphoma undergoing chemo, history of breast cancer, hypertension, hypothyroidism who presents as a direct admission from oncology office for further evaluation of neutropenic fever. She was found to have to 100.6 white blood cells 0.9, neutrophil count 600, platelets 118.  Patient admitted for neutropenic fever.  No focal symptoms or signs of infection.   Assessment & Plan:   Principal Problem:   Neutropenic fever ([place]) Active Problems:   Allergic rhinitis   HTN (hypertension)   Hyperlipemia, mixed   Hypothyroidism   GERD (gastroesophageal reflux disease)   Vitamin D deficiency   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HCC)   B12 deficiency   Iron deficiency anemia due to chronic blood loss  1-Neutropenic Fever:  -Patient Presents with Fever, WBC 0.9 in setting of recent chemo.  -Chest x-ray negative for pneumonia, UA negative. -C diff negative.  -Follow blood cultures, urine culture No growth to date.  -Continue with vancomycin and cefepime -WBC increase to 1.3 ANC 300. -Still having fevers.   2--Large B-cell lymphoma; Follow by oncology, Dr.  Lorenso Courier.  Undergoing chemotherapy.    3-Hypothyroidism: Continue with Synthroid.   4-B12 deficiency: Continue with B12 injections. 5-GERD: Continue with PPI 6-Asthma; Continue with inhaler. 7-Insomnia; Continue with Remeron. 8-History of SBO:  She had a CT abdomen 02/11/2021 that show fairly high-grade small bowel obstruction which appears to be due to a very irregular masslike area of his scaring changes or treated tumor in the right lower quadrant with surrounding fibrosis and adhesions.  Resume miralax.  Hypokalemia; replete IV and orally.    Estimated body mass index is 19.8 kg/m as calculated from the following:   Height as of an earlier  encounter on 02/18/21: 5\' 6"  (1.676 m).   Weight as of an earlier encounter on 02/18/21: 55.7 kg.   DVT prophylaxis: Lovenox Code Status: Full code Family Communication: care discussed with patient Disposition Plan:  Status is: Inpatient  Remains inpatient appropriate because:IV treatments appropriate due to intensity of illness or inability to take PO  Dispo: The patient is from: Home              Anticipated d/c is to: Home              Patient currently is not medically stable to d/c.   Difficult to place patient No        Consultants:  Oncology   Procedures:  None  Antimicrobials:    Subjective: Denies dyspnea. No new symptoms.  Denies worsening abdominal pain or diarrhea.   Objective: Vitals:   02/19/21 2307 02/20/21 0307 02/20/21 0746 02/20/21 1337  BP: (!) 111/54 (!) 118/58 134/70 (!) 121/57  Pulse: 78 100 (!) 108 (!) 103  Resp: 17 17 20 16   Temp: (!) 97.5 F (36.4 C) 99 F (37.2 C) 99.7 F (37.6 C) (!) 100.4 F (38 C)  TempSrc: Oral Oral Oral Oral  SpO2: 95% 95% 93% 96%    Intake/Output Summary (Last 24 hours) at 02/20/2021 1409 Last data filed at 02/20/2021 0400 Gross per 24 hour  Intake 1833.65 ml  Output --  Net 1833.65 ml    There were no vitals filed for this visit.  Examination:  General exam: NAD Respiratory system: CTA Cardiovascular system: S 1, S 2 RRR Gastrointestinal system: BS present, soft, nt Central nervous system: Alert,  Extremities:  No edema   Data Reviewed: I have personally reviewed following labs and imaging studies  CBC: Recent Labs  Lab 02/14/21 0521 02/18/21 1321 02/19/21 0524 02/20/21 0628  WBC 2.7* 0.9* 0.7* 1.3*  NEUTROABS 2.4 0.6* 0.2* 0.3*  HGB 9.0* 8.9* 8.4* 8.0*  HCT 28.2* 27.0* 26.0* 24.4*  MCV 89.2 87.4 90.0 88.7  PLT 261 118* 109* 123*    Basic Metabolic Panel: Recent Labs  Lab 02/14/21 0521 02/15/21 0534 02/18/21 1321 02/19/21 0524 02/20/21 0628  NA 138 140 137 136 134*  K 2.9* 3.6  3.5 3.1* 2.8*  CL 104 107 102 104 101  CO2 28 28 27 27 27   GLUCOSE 84 87 99 89 84  BUN 9 10 13 11 9   CREATININE 0.44 0.49 0.58 0.62 0.44  CALCIUM 7.8* 8.5* 8.3* 8.0* 7.7*    GFR: Estimated Creatinine Clearance: 52.6 mL/min (by C-G formula based on SCr of 0.44 mg/dL). Liver Function Tests: Recent Labs  Lab 02/14/21 0521 02/18/21 1321 02/19/21 0524  AST 13* 18 14*  ALT 15 30 24   ALKPHOS 44 61 54  BILITOT 0.4 0.8 1.0  PROT 4.4* 5.2* 4.8*  ALBUMIN 2.4* 3.0* 2.6*    No results for input(s): LIPASE, AMYLASE in the last 168 hours. No results for input(s): AMMONIA in the last 168 hours. Coagulation Profile: No results for input(s): INR, PROTIME in the last 168 hours. Cardiac Enzymes: No results for input(s): CKTOTAL, CKMB, CKMBINDEX, TROPONINI in the last 168 hours. BNP (last 3 results) No results for input(s): PROBNP in the last 8760 hours. HbA1C: No results for input(s): HGBA1C in the last 72 hours. CBG: No results for input(s): GLUCAP in the last 168 hours. Lipid Profile: No results for input(s): CHOL, HDL, LDLCALC, TRIG, CHOLHDL, LDLDIRECT in the last 72 hours. Thyroid Function Tests: No results for input(s): TSH, T4TOTAL, FREET4, T3FREE, THYROIDAB in the last 72 hours. Anemia Panel: No results for input(s): VITAMINB12, FOLATE, FERRITIN, TIBC, IRON, RETICCTPCT in the last 72 hours. Sepsis Labs: No results for input(s): PROCALCITON, LATICACIDVEN in the last 168 hours.  Recent Results (from the past 240 hour(s))  Culture, blood (Routine X 2) w Reflex to ID Panel     Status: None (Preliminary result)   Collection Time: 02/18/21  5:38 PM   Specimen: BLOOD  Result Value Ref Range Status   Specimen Description   Final    BLOOD LEFT ANTECUBITAL Performed at [facility] [address], [place], [place] [address]    Special Requests   Final    BOTTLES DRAWN AEROBIC AND ANAEROBIC Blood Culture adequate volume Performed at [facility] [address], [place], [place] [address]    Culture   Final    NO GROWTH 2 DAYS Performed at [facility] [address] [place], [place] [address]    Report Status PENDING  Incomplete  Culture, blood (Routine X 2) w Reflex to ID Panel     Status: None (Preliminary result)   Collection Time: 02/18/21  5:38 PM   Specimen: BLOOD  Result Value Ref Range Status   Specimen Description   Final    BLOOD UMBILICAL ARTERY CATHETER Performed at [facility] [address] [place], [place] [address]    Special Requests   Final    BOTTLES DRAWN AEROBIC AND ANAEROBIC Blood Culture adequate volume Performed at [facility] [address] [place],  [address]    Culture   Final  NO GROWTH 2 DAYS Performed at [facility], [place] [address] [place], [place] [address]    Report Status PENDING  Incomplete  C Difficile Quick Screen w PCR reflex     Status: Abnormal   Collection Time: 02/18/21  6:10 PM   Specimen: STOOL  Result Value Ref Range Status   C Diff antigen NON REACTIVE (A) NEGATIVE Final   C Diff toxin NON REACTIVE (A) NEGATIVE Final   C Diff interpretation NEGATIVE  Final    Comment: Performed at [facility], [place] [address], [place], [place] [address]  SARS CORONAVIRUS 2 (TAT 6-24 HRS) Nasopharyngeal STOOL     Status: None   Collection Time: 02/18/21  6:10 PM   Specimen: STOOL; Nasopharyngeal  Result Value Ref Range Status   SARS Coronavirus 2 NEGATIVE NEGATIVE Final    Comment: (NOTE) SARS-CoV-2 target nucleic acids are NOT DETECTED.  The SARS-CoV-2 RNA is generally detectable in upper and lower respiratory specimens during the acute phase of infection. Negative results do not preclude SARS-CoV-2 infection, do not rule out co-infections with other pathogens, and should not be used as the sole basis for treatment or other patient management decisions. Negative results must be combined with  clinical observations, patient history, and epidemiological information. The expected result is Negative.  Fact Sheet for Patients: SugarRoll.be  Fact Sheet for Healthcare Providers: https://www.woods-mathews.com/  This test is not yet approved or cleared by the [place] FDA and  has been authorized for detection and/or diagnosis of SARS-CoV-2 by FDA under an Emergency Use Authorization (EUA). This EUA will remain  in effect (meaning this test can be used) for the duration of the COVID-19 declaration under Se ction 564(b)(1) of the Act, 21 U.S.C. section 360bbb-3(b)(1), unless the authorization is terminated or revoked sooner.  Performed at [facility], [place] [address], [place], [place] [address]   Urine Culture     Status: None   Collection Time: 02/18/21  8:58 PM   Specimen: Urine, Random  Result Value Ref Range Status   Specimen Description   Final    URINE, RANDOM Performed at [facility] [address], [place], [place] [address]    Special Requests   Final    NONE Performed at [facility], [place] [address], [place], [place] [address]    Culture   Final    NO GROWTH Performed at [facility], [place] [address], [place], [place] [address]    Report Status 02/20/2021 FINAL  Final          Radiology Studies: DG Chest 2 View  Result Date: 02/18/2021 CLINICAL DATA:  History of asthma and lymphoma EXAM: CHEST - 2 VIEW COMPARISON:  05/07/2020 FINDINGS: Cardiac shadow is within normal limits. Right-sided chest wall port is noted in satisfactory position. Minimal scarring is noted in the bases bilaterally. No focal infiltrate or effusion is seen. Degenerative change of the thoracic spine is noted. IMPRESSION: No acute abnormality noted. Electronically Signed   By: Inez Catalina M.D.   On: 02/18/2021 19:25        Scheduled Meds:  atorvastatin  10 mg Oral QHS   Chlorhexidine  Gluconate Cloth  6 each Topical Daily   [START ON 02/28/2021] cyanocobalamin  1,000 mcg Subcutaneous Q30 days   enoxaparin (LOVENOX) injection  40 mg Subcutaneous Q24H   ezetimibe  10 mg Oral QHS   fluticasone  2 spray Each Nare Daily   levothyroxine  100 mcg Oral Q48H   levothyroxine  88 mcg Oral Q48H   loratadine  10 mg Oral Daily   LORazepam  0.5 mg Oral QHS   mirtazapine  15 mg Oral QHS   montelukast  10 mg Oral Daily   multivitamin with minerals  1 tablet Oral Daily   polyethylene glycol  17 g Oral Daily   venlafaxine XR  37.5 mg Oral Q breakfast   Continuous Infusions:  ceFEPime (MAXIPIME) IV 2 g (02/20/21 0626)   lactated ringers 100 mL/hr at 02/19/21 1622   vancomycin 1,000 mg (02/19/21 2017)     LOS: 2 days    Time spent: 35 minutes.     Elmarie Shiley, MD [facility]   If 7PM-7AM, please contact night-coverage [email or web address]  02/20/2021, 2:09 PM

## 2021-02-20 NOTE — Progress Notes (Signed)
Initial Nutrition Assessment RD working remotely.   DOCUMENTATION CODES:   Not applicable  INTERVENTION:  - will order Ensure Enlive once/day, each supplement provides 350 kcal and 20 grams of protein. - complete NFPE when feasible.    NUTRITION DIAGNOSIS:   Increased nutrient needs related to acute illness, chronic illness as evidenced by estimated needs.  GOAL:   Patient will meet greater than or equal to 90% of their needs  MONITOR:   PO intake, Supplement acceptance, Labs, Weight trends  REASON FOR ASSESSMENT:   Malnutrition Screening Tool  ASSESSMENT:   75 year old female with medical history of large B-cell lymphoma undergoing chemo, hx of breast cancer, HTN, and hypothyroidism. She presented to the ED as a direct admission from oncology office for further evaluation of neutropenic fever.  Patient is currently out of the room to Diagnostic Radiology. She consumed 100% of lunch and dinner yesterday (total of 2478 kcal and 84 grams protein) and 100% of breakfast today (1102 kcal and 30 grams protein).  She was seen by another RD on 01/18/21 at which time she was ordered Ensure BID and provided with "High Calorie, High Protein" handout as patient was preparing to start chemo.   Weight on 02/18/21 was 122 lb and weight on 01/14/21 was 125 lb. Weight on 12/16/20 was 129 lb which indicates 7 lb weight loss (5.4% body weight) in the past 2 months; not significant for time frame.  Per notes: - neutropenic fever - large B-cell lymphoma - from home with plan to return home at the time of d/c   Labs reviewed; Na: 134 mmol/l, K: 2.8 mmol/l, Ca: 7 mg/dl. Medications reviewed; 1000 mcg cyanocobalamin/day, 100 mcg oral synthroid/day, 1 tablet multivitamin with minerals/day, 17 g miralax/day, 10 mEq IV KCl x3 runs 6/19, 20 mEq Klor=Con x1 dose 6/18, 40 mEq Klor-Con x2 doses 6/19. IVF; LR @ 100 ml/hr.     NUTRITION - FOCUSED PHYSICAL EXAM:  Unable to complete at this  time.  Diet Order:   Diet Order             Diet regular Room service appropriate? Yes; Fluid consistency: Thin  Diet effective now                   EDUCATION NEEDS:   No education needs have been identified at this time  Skin:  Skin Assessment: Reviewed RN Assessment  Last BM:  6/17 per patient  Height:   Ht Readings from Last 1 Encounters:  02/18/21 5\' 6"  (1.676 m)    Weight:   Wt Readings from Last 1 Encounters:  02/18/21 55.7 kg     Estimated Nutritional Needs:  Kcal:  1670-1840 kcal Protein:  75-90 grams Fluid:  >/= 1.8 L/day       Jarome Matin, MS, RD, LDN, CNSC Inpatient Clinical Dietitian RD pager # available in AMION  After hours/weekend pager # available in [facility]

## 2021-02-20 NOTE — TOC Initial Note (Signed)
Transition of Care [facility]) - Initial/Assessment Note    Patient Details  Name: Lindsey Price MRN: [id number] Date of Birth: 12/20/1945  [facility]) CM/SW Contact:    Iona Beard, [place] Phone Number: 02/20/2021, 12:43 PM  Clinical Narrative:                 Pt is high risk for readmission. CSW spoke with pts husband Mr. Harmon to complete pts assessment. Pt is independent in completing her ADLs. Per Mr. Kutsch pt can drive but prefers her husband do the driving. Pt has not had [place] services in the past nor does she use any DME.   Expected Discharge Plan: Home/Self Care Barriers to Discharge: Continued Medical Work up   Patient Goals and CMS Choice Patient states their goals for this hospitalization and ongoing recovery are:: Return home CMS Medicare.gov Compare Post Acute Care list provided to:: Patient Choice offered to / list presented to : Spouse, Patient  Expected Discharge Plan and Services Expected Discharge Plan: Home/Self Care In-house Referral: NA Discharge Planning Services: NA Post Acute Care Choice: NA Living arrangements for the past 2 months: Single Family Home                 DME Arranged: N/A DME Agency: NA       HH Arranged: NA [place] Agency: NA        Prior Living Arrangements/Services Living arrangements for the past 2 months: [place] Lives with:: Spouse Patient language and need for interpreter reviewed:: Yes Do you feel safe going back to the place where you live?: Yes      Need for Family Participation in Patient Care: Yes (Comment) Care giver support system in place?: Yes (comment)   Criminal Activity/Legal Involvement Pertinent to Current Situation/Hospitalization: No - Comment as needed  Activities of Daily Living Home Assistive Devices/Equipment: Eyeglasses, Blood pressure cuff ADL Screening (condition at time of admission) Patient's cognitive ability adequate to safely complete daily activities?: Yes Is the  patient deaf or have difficulty hearing?: No Does the patient have difficulty seeing, even when wearing glasses/contacts?: No Does the patient have difficulty concentrating, remembering, or making decisions?: No Patient able to express need for assistance with ADLs?: Yes Does the patient have difficulty dressing or bathing?: No Independently performs ADLs?: Yes (appropriate for developmental age) Does the patient have difficulty walking or climbing stairs?: No Weakness of Legs: None Weakness of Arms/Hands: None  Permission Sought/Granted                  Emotional Assessment Appearance:: Appears stated age     Orientation: : Oriented to Self, Oriented to Place, Oriented to  Time, Oriented to Situation Alcohol / Substance Use: Not Applicable Psych Involvement: No (comment)  Admission diagnosis:  Neutropenic fever ([place]) [D70.9, R50.81] Patient Active Problem List   Diagnosis Date Noted   Neutropenic fever ([place]) 02/18/2021   Symptomatic anemia    Port-A-Cath in place 12/24/2020   B12 deficiency    Iron deficiency anemia due to chronic blood loss    Encounter for antineoplastic chemotherapy 11/15/2020   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[place]) 11/11/2020   Malignant neoplasm of left breast in female, estrogen receptor positive ([place]) 10/12/2020   Colitis, acute 10/01/2020   Hoarseness, chronic 05/07/2020   Flank pain 09/09/2019   Insulin resistance/ pre-DM 08/21/2019   Benign polyp of large intestine 08/21/2019   Educated about COVID-19 virus infection 04/16/2019   Chronic left hip pain 04/16/2019  Piriformis muscle pain 04/16/2019   Contact dermatitis 09/30/2018   Traumatic complete tear of right rotator cuff 09/30/2018   Chronic right shoulder pain 09/30/2018   Muscle cramp, nocturnal 08/08/2018   Low HDL (under 40) 04/04/2018   Reactive airway disease 12/17/2017   Vitamin D deficiency 04/12/2017   Osteopenia 04/12/2017   HTN (hypertension) 03/12/2017   Hyperlipemia, mixed  03/12/2017   Cataracts, bilateral- s/p surgery. 03/12/2017   Hypothyroidism 03/12/2017   Environmental and seasonal allergies 03/12/2017   Adjustment disorder 03/12/2017   GERD (gastroesophageal reflux disease) 03/12/2017   Family history of multiple cancers 03/12/2017   History of tobacco abuse 03/12/2017   Allergic rhinitis 03/23/2015   Cough variant asthma 02/23/2015   Cancer of right breast, stage 0 09/11/2011   Insomnia 09/11/2011   Arthralgia 09/11/2011   PCP:  Leonides Sake, MD Pharmacy:   [id number] - [place], [place] [address] Phone: [phone] Fax: [phone]     Social Determinants of Health (SDOH) Interventions    Readmission Risk Interventions Readmission Risk Prevention Plan 02/20/2021  Transportation Screening Complete  HRI or Home Care Consult Complete  Social Work Consult for [employer] Planning/Counseling Complete  Palliative Care Screening Not Applicable  Medication Review Press photographer) Complete  Some recent data might be hidden

## 2021-02-21 ENCOUNTER — Telehealth: Payer: Self-pay | Admitting: Physician Assistant

## 2021-02-21 ENCOUNTER — Other Ambulatory Visit: Payer: Self-pay

## 2021-02-21 DIAGNOSIS — R5081 Fever presenting with conditions classified elsewhere: Secondary | ICD-10-CM | POA: Diagnosis not present

## 2021-02-21 DIAGNOSIS — C8510 Unspecified B-cell lymphoma, unspecified site: Secondary | ICD-10-CM

## 2021-02-21 DIAGNOSIS — D709 Neutropenia, unspecified: Secondary | ICD-10-CM | POA: Diagnosis not present

## 2021-02-21 LAB — BASIC METABOLIC PANEL
Anion gap: 6 (ref 5–15)
BUN: 9 mg/dL (ref 8–23)
CO2: 26 mmol/L (ref 22–32)
Calcium: 7.9 mg/dL — ABNORMAL LOW (ref 8.9–10.3)
Chloride: 102 mmol/L (ref 98–111)
Creatinine, Ser: 0.51 mg/dL (ref 0.44–1.00)
GFR, Estimated: >60 mL/min (ref >60)
Glucose, Bld: 81 mg/dL (ref 70–99)
Potassium: 2.7 mmol/L — CL (ref 3.5–5.1)
Sodium: 134 mmol/L — ABNORMAL LOW (ref 135–145)

## 2021-02-21 LAB — CBC WITH DIFFERENTIAL/PLATELET
Abs Immature Granulocytes: 1.54 10*3/uL — ABNORMAL HIGH (ref 0.00–0.07)
Basophils Absolute: 0 10*3/uL (ref 0.0–0.1)
Basophils Relative: 0 %
Eosinophils Absolute: 0 10*3/uL (ref 0.0–0.5)
Eosinophils Relative: 0 %
HCT: 29.4 % — ABNORMAL LOW (ref 36.0–46.0)
Hemoglobin: 9.5 g/dL — ABNORMAL LOW (ref 12.0–15.0)
Immature Granulocytes: 12 %
Lymphocytes Relative: 3 %
Lymphs Abs: 0.4 10*3/uL — ABNORMAL LOW (ref 0.7–4.0)
MCH: 28.8 pg (ref 26.0–34.0)
MCHC: 32.3 g/dL (ref 30.0–36.0)
MCV: 89.1 fL (ref 80.0–100.0)
Monocytes Absolute: 3.1 10*3/uL — ABNORMAL HIGH (ref 0.1–1.0)
Monocytes Relative: 25 %
Neutro Abs: 7.3 10*3/uL (ref 1.7–7.7)
Neutrophils Relative %: 60 %
Platelets: 239 10*3/uL (ref 150–400)
RBC: 3.3 MIL/uL — ABNORMAL LOW (ref 3.87–5.11)
RDW: 18.1 % — ABNORMAL HIGH (ref 11.5–15.5)
WBC: 12.4 10*3/uL — ABNORMAL HIGH (ref 4.0–10.5)
nRBC: 0.2 % (ref 0.0–0.2)

## 2021-02-21 LAB — MAGNESIUM: Magnesium: 1.1 mg/dL — ABNORMAL LOW (ref 1.7–2.4)

## 2021-02-21 MED ORDER — MAGNESIUM SULFATE 2 GM/50ML IV SOLN
2.0000 g | Freq: Once | INTRAVENOUS | Status: AC
  Administered 2021-02-21: 2 g via INTRAVENOUS
  Filled 2021-02-21: qty 50

## 2021-02-21 MED ORDER — POTASSIUM CHLORIDE CRYS ER 20 MEQ PO TBCR
40.0000 meq | EXTENDED_RELEASE_TABLET | ORAL | Status: AC
  Administered 2021-02-21 (×2): 40 meq via ORAL
  Filled 2021-02-21 (×2): qty 2

## 2021-02-21 MED ORDER — POTASSIUM CHLORIDE CRYS ER 20 MEQ PO TBCR
40.0000 meq | EXTENDED_RELEASE_TABLET | Freq: Once | ORAL | Status: AC
  Administered 2021-02-21: 40 meq via ORAL
  Filled 2021-02-21: qty 2

## 2021-02-21 MED ORDER — POTASSIUM CHLORIDE 10 MEQ/100ML IV SOLN
10.0000 meq | INTRAVENOUS | Status: AC
  Administered 2021-02-21 (×4): 10 meq via INTRAVENOUS
  Filled 2021-02-21 (×4): qty 100

## 2021-02-21 NOTE — Progress Notes (Signed)
Pharmacy Antibiotic Note  Lindsey Price is a 75 y.o. female with large B-cell lymphoma, receiving EPOCH chemotherapy, admitted on 02/18/2021 with neutropenic fever.  Pharmacy has been consulted for cefepime and vancomycin dosing.  Today, 02/21/21 WBC/ANC remain low, no new labs this morning Tmax 100.4 F SCr WNL, stable. CrCl ~ 53 mL/min Cultures remain negative thus far  Today is day #4 of broad spectrum IV antibiotics.   Plan: Continue Cefepime 2 grams IV q12h Continue Vancomycin 1000 mg IV q24h (Estimated AUC 490) Follow up renal function, culture results, and clinical course.   Per oncology note on 6/19, anticipate continuation of antibiotics until ANC > 500 and cultures negative    Temp (24hrs), Avg:99.3 F (37.4 C), Min:98.5 F (36.9 C), Max:100.4 F (38 C)  Recent Labs  Lab 02/15/21 0534 02/18/21 1321 02/19/21 0524 02/20/21 0628 02/21/21 0513  WBC  --  0.9* 0.7* 1.3*  --   CREATININE 0.49 0.58 0.62 0.44 0.51     Estimated Creatinine Clearance: 52.6 mL/min (by C-G formula based on SCr of 0.51 mg/dL).    Allergies  Allergen Reactions   Advair Diskus [Fluticasone-Salmeterol] Other (See Comments)    Hoarseness   Neosporin [Neomycin-Polymyxin-Gramicidin] Itching    Antimicrobials this admission: Cefepime 6/17 >> Vancomycin 6/17>>  Dose adjustments this admission:  Microbiology results: BCx 6/17: ngtd UCx 6/17: ngF  Thank you for allowing pharmacy to be a part of this patient's care.  [name], PharmD 02/21/21 7:29 AM

## 2021-02-21 NOTE — Progress Notes (Signed)
MD aware of K+2.7, orders received to replace

## 2021-02-21 NOTE — Progress Notes (Signed)
HEMATOLOGY-ONCOLOGY PROGRESS NOTE  SUBJECTIVE: T-max 100.4 in the past 24 hours.  She is not having any chills.  She reports that her appetite is still poor.  She is trying to eat small bites of food.  She has been having diarrhea.  Denies abdominal pain, nausea, vomiting.  Reports some mild mucositis.  Denies chest pain and shortness of breath.  She offers no other complaints today.  Cultures remain negative to date.   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 Biopsy showed invasive and in situ ductal carcinoma, HER-2 equivocal by IHC (2+), negative by FISH (ratio 1.59), ER+ >95%, PR+ 85%, Ki67 10%.    Malignant neoplasm of left breast in female, estrogen receptor positive ([place])  10/12/2020 Initial Diagnosis   Malignant neoplasm of left breast in female, estrogen receptor positive ([place])    10/12/2020 Cancer Staging   Staging form: Breast, AJCC 8th Edition - Clinical stage from 10/12/2020: Stage IA (cT1b, cN0, cM0, G2, ER+, PR+, HER2-) - Signed by Nicholas Lose, MD on 10/15/2020  Stage prefix: Initial diagnosis    11/05/2020 Surgery   Left lumpectomy: Grade 1 IDC, 1.1 cm, intermediate grade DCIS, margins are negative, lymph node -0/1, ER greater than 95%, PR 85%, HER-2 negative, Ki-67 10%   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[place])  11/11/2020 Initial Diagnosis   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[place])    11/15/2020 -  Chemotherapy    Patient is on Treatment Plan: IP NON-HODGKINS LYMPHOMA EPOCH Q21D   Patient is on Antibody Plan: NON-HODGKINS LYMPHOMA RITUXIMAB Q21D     11/19/2020  -  Chemotherapy    Patient is on Treatment Plan: IP NON-HODGKINS LYMPHOMA EPOCH Q21D   Patient is on Antibody Plan: NON-HODGKINS LYMPHOMA RITUXIMAB Q21D        REVIEW OF SYSTEMS:   Constitutional: T-max 100.4, no chills Eyes: Denies blurriness of vision Ears, nose, mouth, throat, and face: Reports mild mouth sores Respiratory: Denies cough, dyspnea or wheezes Cardiovascular: Denies palpitation, chest discomfort Gastrointestinal: Reports diarrhea, no abdominal pain, nausea, vomiting Skin: Denies abnormal skin rashes Lymphatics: Denies new lymphadenopathy or easy bruising Neurological: Denies dizziness and headache Behavioral/Psych: Mood is stable, no new changes  Extremities: No lower extremity edema All other systems were reviewed with the patient and are negative.  I have reviewed the past medical history, past surgical history, social history and family history with the patient and they are unchanged from previous note.   PHYSICAL EXAMINATION: ECOG PERFORMANCE STATUS: 1 - Symptomatic but completely ambulatory  Vitals:   02/20/21 2022 02/21/21 0433  BP: (!) 115/57 124/62  Pulse: 91 98  Resp: 16 16  Temp: 98.9 F (37.2 C) 98.5 F (36.9 C)  SpO2: 94% 93%   Filed Weights   02/21/21 1022  Weight: 55.6 kg    Intake/Output from previous day: 06/19 0701 - 06/20 0700 In: 120 [P.O.:120] Out: -   GENERAL:alert, no distress and comfortable SKIN: skin color, texture, turgor are normal, no rashes or significant lesions EYES: normal, Conjunctiva are pink and non-injected, sclera clear OROPHARYNX:no exudate, no  erythema and lips, buccal mucosa, and tongue normal  LUNGS: clear to auscultation and percussion with normal breathing effort HEART: regular rate & rhythm and no murmurs and no lower extremity edema ABDOMEN: Positive bowel sounds, soft, nontender Musculoskeletal:no cyanosis of digits and no clubbing  NEURO: alert & oriented x 3 with fluent speech, no focal  motor/sensory deficits  LABORATORY DATA:  I have reviewed the data as listed CMP Latest Ref Rng & Units 02/21/2021 02/20/2021 02/19/2021  Glucose 70 - 99 mg/dL 81 84 89  BUN 8 - 23 mg/dL _0 Creatinine 0.44 - 1.00 mg/dL 0.51 0.44 0.62  Sodium 135 - 145 mmol/L 134(L) 134(L) 136  Potassium 3.5 - 5.1 mmol/L 2.7(LL) 2.8(L) 3.1(L)  Chloride 98 - 111 mmol/L 102 101 104  CO2 22 - 32 mmol/L _1 Calcium 8.9 - 10.3 mg/dL 7.9(L) 7.7(L) 8.0(L)  Total Protein 6.5 - 8.1 g/dL - - 4.8(L)  Total Bilirubin 0.3 - 1.2 mg/dL - - 1.0  Alkaline Phos 38 - 126 U/L - - 54  AST 15 - 41 U/L - - 14(L)  ALT 0 - 44 U/L - - 24    Lab Results  Component Value Date   WBC 12.4 (H) 02/21/2021   HGB 9.5 (L) 02/21/2021   HCT 29.4 (L) 02/21/2021   MCV 89.1 02/21/2021   PLT 239 02/21/2021   NEUTROABS 7.3 02/21/2021    DG Chest 2 View  Result Date: 02/18/2021 CLINICAL DATA:  History of asthma and lymphoma EXAM: CHEST - 2 VIEW COMPARISON:  05/07/2020 FINDINGS: Cardiac shadow is within normal limits. Right-sided chest wall port is noted in satisfactory position. Minimal scarring is noted in the bases bilaterally. No focal infiltrate or effusion is seen. Degenerative change of the thoracic spine is noted. IMPRESSION: No acute abnormality noted. Electronically Signed   By: Inez Catalina M.D.   On: 02/18/2021 19:25   CT ABDOMEN PELVIS W CONTRAST  Result Date: 02/11/2021 CLINICAL DATA:  History of large B-cell lymphoma involving the cecum and distal ileum. EXAM: CT ABDOMEN AND PELVIS WITH CONTRAST TECHNIQUE: Multidetector CT imaging of the abdomen and pelvis was performed using the standard protocol following bolus administration of intravenous contrast. CONTRAST:  46m OMNIPAQUE IOHEXOL 300 MG/ML  SOLN COMPARISON:  CT scan 10/21/2020 FINDINGS: Lower chest: Very small bilateral pleural effusions with minimal overlying atelectasis. No worrisome pulmonary lesions. Probable radiation changes involving the anterior aspect of  the right lung and there are surgical changes involving the right breast. The heart is within normal limits in size. No pericardial effusion. Hepatobiliary: No hepatic lesions are identified. Gallbladder is slightly distended no definite gallstones or findings for acute cholecystitis. No common bile duct dilatation. Pancreas: No mass, inflammation or ductal dilatation. Spleen: Normal size. No focal lesions. Adrenals/Urinary Tract: Adrenal glands and kidneys are unremarkable. The bladder is unremarkable. Stomach/Bowel: The stomach and duodenum are unremarkable. There is a fairly high-grade small-bowel obstruction noted with markedly dilated small bowel loops and air-fluid/air contrast levels throughout. The colon is decompressed. The small-bowel obstruction appears to be at the distal ileum. There is a very irregular masslike area of scarring changes, fibrosis or treated lymphoid tissue in the right lower quadrant. Surrounding adhesions or scarring in this area. The cecum now appears normal. No residual bowel wall thickening. Vascular/Lymphatic: Stable atherosclerotic calcifications involving the aorta and iliac arteries but no aneurysm. No mesenteric or retroperitoneal mass or adenopathy. Reproductive: Surgically absent. Other: Small amount of free abdominal/free pelvic fluid likely related  to the patient has small-bowel obstruction. Musculoskeletal: No significant bony findings. IMPRESSION: 1. Fairly high-grade small-bowel obstruction as detailed above. This appears to be due to a very irregular masslike area of scarring change or treated tumor in the right lower quadrant with surrounding fibrosis and adhesions. 2. No residual bowel wall thickening to suggest persistent lymphoma involving the cecum or terminal ileum. 3. Small amount of free abdominal/free pelvic fluid likely related to the small-bowel obstruction. 4. Very small bilateral pleural effusions with minimal overlying atelectasis. These results will be  called to the ordering clinician or representative by the Radiologist Assistant, and communication documented in the PACS or [employer]. Aortic Atherosclerosis (ICD10-I70.0). Electronically Signed   By: Marijo Sanes M.D.   On: 02/11/2021 19:37   DG Abd 2 Views  Result Date: 02/13/2021 CLINICAL DATA:  Small-bowel obstruction on CT EXAM: ABDOMEN - 2 VIEW COMPARISON:  CT of 2 days ago FINDINGS: Supine and upright views. The upright view demonstrates no free intraperitoneal air. Right hemidiaphragm elevation. Cardiomegaly. No upper abdominal air-fluid levels. The supine view demonstrates normal caliber gas-filled colon. Small bowel loops measure up to 4.0 cm, minimally decreased compared to the prior CT. IMPRESSION: Improvement in small-bowel obstruction pattern. No free intraperitoneal air or other acute complication. Electronically Signed   By: Abigail Miyamoto M.D.   On: 02/13/2021 10:16    ASSESSMENT AND PLAN: 1) large B-cell lymphoma -CT abdomen/pelvis with contrast 10/21/2020 - "Acute colitis/enteritis of the cecum/ascending colon and the associated terminal loops of ileum. As was recently described on CT imaging, the anatomy of distal ileum, ileocecal valve, cecum is atypical and correlation with patient's prior surgical record would be useful. The acute inflammatory changes contribute to at least a partial small bowel obstruction. Primary concern of this inflammatory cystic mass/bowel of the right lower quadrant is malignancy given that the posterior margin is inseparable from the psoas muscle, adnexa, and the right ureter. Correlation with CEA may be useful. Inflammatory/infectious changes are also a consideration." -Colonoscopy performed 11/04/2020- "An infiltrative and ulcerated partially obstructing large mass was found at 85 cm proximal to the anus. 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  Her WBC is up to 12.4 with an [place] of 7.3.  Leukocytosis due to prior G-CSF.  Hemoglobin 9.5 this morning (?  Hemoconcentrated) and platelets are normal at 239,000.  Her potassium is low this morning 2.7 and magnesium is also low at 1.1. --T-max 100.4 yesterday afternoon.  No fever spike so far today. --transfusion for Hgb <7.0 (or <8.0 and symptomatic) or Plt <10 -continue Cefepime/Vancomycin empirically for neutropenic fever -culture data so far unrevealing. No focal symptoms.  --Potassium magnesium replacement per hospitalist. -Continue as needed antiemetics. -Lovenox for DVT prophylaxis. Hold if Plt <50 -Continue home medications. -Continue PPI and as needed Maalox for reflux symptoms. --White  blood cell count has recovered, hemoglobin stable, and platelets are normal.  Would be okay to discharge to home from our standpoint once otherwise medically stable and potassium magnesium are normal. --She tentatively has a appointment in our office scheduled for 6/21.  I do not anticipate discharge home today we will plan to cancel his appointment.  We will reschedule her for labs and a follow-up visit later this week.  Mikey Bussing, DNP, AGPCNP-BC, AOCNP   LOS: 3 days

## 2021-02-21 NOTE — Progress Notes (Signed)
PROGRESS NOTE    Lindsey Price  OFH:[id number] DOB: [birth year] DOA: 02/18/2021 PCP: Lindsey Sake, MD   Brief Narrative: 75 year old with past medical history significant for large B-cell lymphoma undergoing chemo, history of breast cancer, hypertension, hypothyroidism who presents as a direct admission from oncology office for further evaluation of neutropenic fever. She was found to have to 100.6 white blood cells 0.9, neutrophil count 600, platelets 118.  Patient admitted for neutropenic fever.  No focal symptoms or signs of infection.   Assessment & Plan:   Principal Problem:   Neutropenic fever ([place]) Active Problems:   Allergic rhinitis   HTN (hypertension)   Hyperlipemia, mixed   Hypothyroidism   GERD (gastroesophageal reflux disease)   Vitamin D deficiency   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[facility])   B12 deficiency   Iron deficiency anemia due to chronic blood loss  1-Neutropenic Fever:  -Patient Presents with Fever, WBC 0.9 in setting of recent chemo.  -Chest x-ray negative for pneumonia, UA negative. -C diff negative.  -Follow blood cultures, urine culture No growth to date.  -Continue with vancomycin and cefepime -WBC increase to  12  -Low grade fever yesterday.   2--Large B-cell lymphoma; Follow by oncology, Dr.  Lorenso Price.  Undergoing chemotherapy.    3-Hypothyroidism: Continue with Synthroid.   4-B12 deficiency: Continue with B12 injections. 5-GERD: Continue with PPI 6-Asthma; Continue with inhaler. 7-Insomnia; Continue with Remeron. 8-History of SBO:  She had a CT abdomen 02/11/2021 that show fairly high-grade small bowel obstruction which appears to be due to a very irregular masslike area of his scaring changes or treated tumor in the right lower quadrant with surrounding fibrosis and adhesions.  Resume miralax.  Hypokalemia; Replete IV and orally.  Hypomagnesemia; Replete IV.     Estimated body mass index is 19.78 kg/m as calculated from the following:    Height as of this encounter: 5\' 6"  (1.676 m).   Weight as of this encounter: 55.6 kg.   DVT prophylaxis: Lovenox Code Status: Full code Family Communication: care discussed with patient Disposition Plan:  Status is: Inpatient  Remains inpatient appropriate because:IV treatments appropriate due to intensity of illness or inability to take PO  Dispo: The patient is from: Home              Anticipated d/c is to: Home              Patient currently is not medically stable to d/c.   Difficult to place patient No        Consultants:  Oncology   Procedures:  None  Antimicrobials:    Subjective: She feels weak, tired.  Denies pain, had multiples BM last night.  Plan to hold miralax.   Objective: Vitals:   02/20/21 1500 02/20/21 2022 02/21/21 0433 02/21/21 1022  BP:  (!) 115/57 124/62   Pulse:  91 98   Resp:  16 16   Temp: 98.8 F (37.1 C) 98.9 F (37.2 C) 98.5 F (36.9 C)   TempSrc: Oral Oral Oral   SpO2:  94% 93%   Weight:    55.6 kg  Height:    5\' 6"  (1.676 m)    Intake/Output Summary (Last 24 hours) at 02/21/2021 1511 Last data filed at 02/21/2021 1507 Gross per 24 hour  Intake 883.08 ml  Output --  Net 883.08 ml    Filed Weights   02/21/21 1022  Weight: 55.6 kg    Examination:  General exam: NAD Respiratory system: CTA Cardiovascular system: S  1, S 2 RRR Gastrointestinal system: BS present, soft, nt Central nervous system: Alert Extremities: No edema   Data Reviewed: I have personally reviewed following labs and imaging studies  CBC: Recent Labs  Lab 02/18/21 1321 02/19/21 0524 02/20/21 0628 02/21/21 0859  WBC 0.9* 0.7* 1.3* 12.4*  NEUTROABS 0.6* 0.2* 0.3* 7.3  HGB 8.9* 8.4* 8.0* 9.5*  HCT 27.0* 26.0* 24.4* 29.4*  MCV 87.4 90.0 88.7 89.1  PLT 118* 109* 123* 212    Basic Metabolic Panel: Recent Labs  Lab 02/15/21 0534 02/18/21 1321 02/19/21 0524 02/20/21 0628 02/21/21 0513  NA 140 137 136 134* 134*  K 3.6 3.5 3.1* 2.8*  2.7*  CL 107 102 104 101 102  CO2 28 27 27 27 26   GLUCOSE 87 99 89 84 81  BUN 10 13 11 9 9   CREATININE 0.49 0.58 0.62 0.44 0.51  CALCIUM 8.5* 8.3* 8.0* 7.7* 7.9*  MG  --   --   --   --  1.1*    GFR: Estimated Creatinine Clearance: 52.5 mL/min (by C-G formula based on SCr of 0.51 mg/dL). Liver Function Tests: Recent Labs  Lab 02/18/21 1321 02/19/21 0524  AST 18 14*  ALT 30 24  ALKPHOS 61 54  BILITOT 0.8 1.0  PROT 5.2* 4.8*  ALBUMIN 3.0* 2.6*    No results for input(s): LIPASE, AMYLASE in the last 168 hours. No results for input(s): AMMONIA in the last 168 hours. Coagulation Profile: No results for input(s): INR, PROTIME in the last 168 hours. Cardiac Enzymes: No results for input(s): CKTOTAL, CKMB, CKMBINDEX, TROPONINI in the last 168 hours. BNP (last 3 results) No results for input(s): PROBNP in the last 8760 hours. HbA1C: No results for input(s): HGBA1C in the last 72 hours. CBG: No results for input(s): GLUCAP in the last 168 hours. Lipid Profile: No results for input(s): CHOL, HDL, LDLCALC, TRIG, CHOLHDL, LDLDIRECT in the last 72 hours. Thyroid Function Tests: No results for input(s): TSH, T4TOTAL, FREET4, T3FREE, THYROIDAB in the last 72 hours. Anemia Panel: No results for input(s): VITAMINB12, FOLATE, FERRITIN, TIBC, IRON, RETICCTPCT in the last 72 hours. Sepsis Labs: No results for input(s): PROCALCITON, LATICACIDVEN in the last 168 hours.  Recent Results (from the past 240 hour(s))  Culture, blood (Routine X 2) w Reflex to ID Panel     Status: None (Preliminary result)   Collection Time: 02/18/21  5:38 PM   Specimen: BLOOD  Result Value Ref Range Status   Specimen Description   Final    BLOOD LEFT ANTECUBITAL Performed at [facility] [address], [place], [place] [address]    Special Requests   Final    BOTTLES DRAWN AEROBIC AND ANAEROBIC Blood Culture adequate volume Performed at [facility] [address], [place], [place] [address]    Culture   Final    NO GROWTH 3 DAYS Performed at [facility], [place] [address] [place], [place] [address]    Report Status PENDING  Incomplete  Culture, blood (Routine X 2) w Reflex to ID Panel     Status: None (Preliminary result)   Collection Time: 02/18/21  5:38 PM   Specimen: BLOOD  Result Value Ref Range Status   Specimen Description   Final    BLOOD UMBILICAL ARTERY CATHETER Performed at [facility] [address] [place], [place] [address]    Special Requests   Final    BOTTLES DRAWN AEROBIC AND ANAEROBIC Blood Culture adequate  volume Performed at [facility], [place] [address] [place], [place] [address]    Culture   Final    NO GROWTH 3 DAYS Performed at [facility], [place] [address], [place], [place] [address]    Report Status PENDING  Incomplete  C Difficile Quick Screen w PCR reflex     Status: Abnormal   Collection Time: 02/18/21  6:10 PM   Specimen: STOOL  Result Value Ref Range Status   C Diff antigen NON REACTIVE (A) NEGATIVE Final   C Diff toxin NON REACTIVE (A) NEGATIVE Final   C Diff interpretation NEGATIVE  Final    Comment: Performed at [facility], [place] [address], [place], [place] [address]  SARS CORONAVIRUS 2 (TAT 6-24 HRS) Nasopharyngeal STOOL     Status: None   Collection Time: 02/18/21  6:10 PM   Specimen: STOOL; Nasopharyngeal  Result Value Ref Range Status   SARS Coronavirus 2 NEGATIVE NEGATIVE Final    Comment: (NOTE) SARS-CoV-2 target nucleic acids are NOT DETECTED.  The SARS-CoV-2 RNA is generally detectable in upper and lower respiratory specimens during the acute phase of infection. Negative results do not preclude SARS-CoV-2 infection, do not rule out co-infections with other pathogens, and should not be used as the sole basis for treatment or other patient management decisions. Negative results must be combined with clinical  observations, patient history, and epidemiological information. The expected result is Negative.  Fact Sheet for Patients: SugarRoll.be  Fact Sheet for Healthcare Providers: https://www.woods-mathews.com/  This test is not yet approved or cleared by the [place] FDA and  has been authorized for detection and/or diagnosis of SARS-CoV-2 by FDA under an Emergency Use Authorization (EUA). This EUA will remain  in effect (meaning this test can be used) for the duration of the COVID-19 declaration under Se ction 564(b)(1) of the Act, 21 U.S.C. section 360bbb-3(b)(1), unless the authorization is terminated or revoked sooner.  Performed at [facility], [place] [address], [place], [place] [address]   Urine Culture     Status: None   Collection Time: 02/18/21  8:58 PM   Specimen: Urine, Random  Result Value Ref Range Status   Specimen Description   Final    URINE, RANDOM Performed at [facility] [address], [place], [place] [address]    Special Requests   Final    NONE Performed at [facility], [place] [address] [place], [place] [address]    Culture   Final    NO GROWTH Performed at [facility], [place] [address], [place], [place] [address]    Report Status 02/20/2021 FINAL  Final          Radiology Studies: No results found.      Scheduled Meds:  atorvastatin  10 mg Oral QHS   Chlorhexidine Gluconate Cloth  6 each Topical Daily   [START ON 02/28/2021] cyanocobalamin  1,000 mcg Subcutaneous Q30 days   enoxaparin (LOVENOX) injection  40 mg Subcutaneous Q24H   ezetimibe  10 mg Oral QHS   feeding supplement  237 mL Oral Q24H   fluticasone  2 spray Each Nare Daily   levothyroxine  100 mcg Oral Q48H   levothyroxine  88 mcg Oral Q48H   loratadine  10 mg Oral Daily   LORazepam  0.5 mg Oral QHS   mirtazapine  15 mg Oral QHS   montelukast  10 mg Oral Daily   multivitamin with  minerals  1 tablet Oral Daily   polyethylene glycol  17 g Oral Daily   potassium chloride  40 mEq Oral Once   venlafaxine XR  37.5 mg Oral Q breakfast   Continuous Infusions:  ceFEPime (MAXIPIME) IV 2 g (02/21/21 0538)   lactated ringers 100 mL/hr at 02/21/21 0537   magnesium sulfate bolus IVPB 2 g (02/21/21 1440)   vancomycin 1,000 mg (02/20/21 1904)     LOS: 3 days    Time spent: 35 minutes.     Elmarie Shiley, MD [facility]   If 7PM-7AM, please contact night-coverage [email or web address]  02/21/2021, 3:11 PM

## 2021-02-21 NOTE — Telephone Encounter (Signed)
R/s appts per 6/20 sch msg. Called pt, no answer. Left msg with appts dates and times.

## 2021-02-22 ENCOUNTER — Other Ambulatory Visit: Payer: PPO

## 2021-02-22 ENCOUNTER — Ambulatory Visit: Payer: PPO | Admitting: Physician Assistant

## 2021-02-22 DIAGNOSIS — C8510 Unspecified B-cell lymphoma, unspecified site: Secondary | ICD-10-CM

## 2021-02-22 LAB — BASIC METABOLIC PANEL
Anion gap: 7 (ref 5–15)
BUN: 7 mg/dL — ABNORMAL LOW (ref 8–23)
CO2: 22 mmol/L (ref 22–32)
Calcium: 7.9 mg/dL — ABNORMAL LOW (ref 8.9–10.3)
Chloride: 106 mmol/L (ref 98–111)
Creatinine, Ser: 0.58 mg/dL (ref 0.44–1.00)
GFR, Estimated: >60 mL/min (ref >60)
Glucose, Bld: 67 mg/dL — ABNORMAL LOW (ref 70–99)
Potassium: 3.8 mmol/L (ref 3.5–5.1)
Sodium: 135 mmol/L (ref 135–145)

## 2021-02-22 LAB — CBC WITH DIFFERENTIAL/PLATELET
Abs Immature Granulocytes: 4.28 10*3/uL — ABNORMAL HIGH (ref 0.00–0.07)
Basophils Absolute: 0 10*3/uL (ref 0.0–0.1)
Basophils Relative: 0 %
Eosinophils Absolute: 0.1 10*3/uL (ref 0.0–0.5)
Eosinophils Relative: 0 %
HCT: 29.4 % — ABNORMAL LOW (ref 36.0–46.0)
Hemoglobin: 9.3 g/dL — ABNORMAL LOW (ref 12.0–15.0)
Immature Granulocytes: 17 %
Lymphocytes Relative: 2 %
Lymphs Abs: 0.4 10*3/uL — ABNORMAL LOW (ref 0.7–4.0)
MCH: 28.4 pg (ref 26.0–34.0)
MCHC: 31.6 g/dL (ref 30.0–36.0)
MCV: 89.6 fL (ref 80.0–100.0)
Monocytes Absolute: 3.5 10*3/uL — ABNORMAL HIGH (ref 0.1–1.0)
Monocytes Relative: 14 %
Neutro Abs: 17.5 10*3/uL — ABNORMAL HIGH (ref 1.7–7.7)
Neutrophils Relative %: 67 %
Platelets: 278 10*3/uL (ref 150–400)
RBC: 3.28 MIL/uL — ABNORMAL LOW (ref 3.87–5.11)
RDW: 18.6 % — ABNORMAL HIGH (ref 11.5–15.5)
WBC: 25.8 10*3/uL — ABNORMAL HIGH (ref 4.0–10.5)
nRBC: 0.2 % (ref 0.0–0.2)

## 2021-02-22 LAB — GLUCOSE, CAPILLARY: Glucose-Capillary: 63 mg/dL — ABNORMAL LOW (ref 70–99)

## 2021-02-22 LAB — MAGNESIUM: Magnesium: 2.1 mg/dL (ref 1.7–2.4)

## 2021-02-22 MED ORDER — LEVOTHYROXINE SODIUM 100 MCG PO TABS: 100.0000 ug | ORAL_TABLET | ORAL | 0 refills | Status: AC

## 2021-02-22 MED ORDER — POTASSIUM CHLORIDE CRYS ER 20 MEQ PO TBCR: 40.0000 meq | EXTENDED_RELEASE_TABLET | Freq: Every day | ORAL | 0 refills | Status: DC

## 2021-02-22 MED ORDER — LEVOTHYROXINE SODIUM 88 MCG PO TABS: 88.0000 ug | ORAL_TABLET | ORAL | 0 refills | Status: AC

## 2021-02-22 MED ORDER — MAGNESIUM 400 MG PO CAPS
1.0000 | ORAL_CAPSULE | Freq: Two times a day (BID) | ORAL | 0 refills | Status: DC
Start: 2021-02-22 — End: 2021-03-04

## 2021-02-22 MED ORDER — HEPARIN SOD (PORK) LOCK FLUSH 100 UNIT/ML IV SOLN
500.0000 [IU] | INTRAVENOUS | Status: DC | PRN
  Filled 2021-02-22: qty 5

## 2021-02-22 NOTE — Progress Notes (Signed)
HEMATOLOGY-ONCOLOGY PROGRESS NOTE  SUBJECTIVE: The patient reports that she feels much better today.  No recurrent fevers or chills.  She denies abdominal pain, nausea, vomiting.  Reported 2 loose stools yesterday.  She offers no other complaints today.   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 Biopsy showed invasive and in situ ductal carcinoma, HER-2 equivocal by IHC (2+), negative by FISH (ratio 1.59), ER+ >95%, PR+ 85%, Ki67 10%.    Malignant neoplasm of left breast in female, estrogen receptor positive ([place])  10/12/2020 Initial Diagnosis   Malignant neoplasm of left breast in female, estrogen receptor positive ([place])    10/12/2020 Cancer Staging   Staging form: Breast, AJCC 8th Edition - Clinical stage from 10/12/2020: Stage IA (cT1b, cN0, cM0, G2, ER+, PR+, HER2-) - Signed by Nicholas Lose, MD on 10/15/2020  Stage prefix: Initial diagnosis    11/05/2020 Surgery   Left lumpectomy: Grade 1 IDC, 1.1 cm, intermediate grade DCIS, margins are negative, lymph node -0/1, ER greater than 95%, PR 85%, HER-2 negative, Ki-67 10%   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[place])  11/11/2020 Initial Diagnosis   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[place])    11/15/2020 -  Chemotherapy    Patient is on Treatment Plan: IP NON-HODGKINS LYMPHOMA EPOCH Q21D   Patient is on Antibody Plan: NON-HODGKINS LYMPHOMA RITUXIMAB Q21D     11/19/2020 -  Chemotherapy    Patient is on Treatment Plan: IP NON-HODGKINS LYMPHOMA EPOCH Q21D   Patient is on Antibody Plan: NON-HODGKINS LYMPHOMA RITUXIMAB Q21D         REVIEW OF SYSTEMS:   Constitutional: Denies fevers and chills Eyes: Denies blurriness of vision Ears, nose, mouth, throat, and face: Mouth sores improved Respiratory: Denies cough, dyspnea or wheezes Cardiovascular: Denies palpitation, chest discomfort Gastrointestinal: Reports improvement in her diarrhea, no abdominal pain, nausea, vomiting Skin: Denies abnormal skin rashes Lymphatics: Denies new lymphadenopathy or easy bruising Neurological: Denies dizziness and headache Behavioral/Psych: Mood is stable, no new changes  Extremities: No lower extremity edema All other systems were reviewed with the patient and are negative.  I have reviewed the past medical history, past surgical history, social history and family history with the patient and they are unchanged from previous note.   PHYSICAL EXAMINATION: ECOG PERFORMANCE STATUS: 1 - Symptomatic but completely ambulatory  Vitals:   02/21/21 2122 02/22/21 0429  BP: 126/60 123/70  Pulse: 99 96  Resp: 18 16  Temp: 98.3 F (36.8 C) 98.2 F (36.8 C)  SpO2: 93% 96%   Filed Weights   02/21/21 1022  Weight: 55.6 kg    Intake/Output from previous day: 06/20 0701 - 06/21 0700 In: 1003.1 [P.O.:240; I.V.:300; IV Piggyback:463.1] Out: -   GENERAL:alert, no distress and comfortable SKIN: skin color, texture, turgor are normal, no rashes or significant lesions EYES: normal, Conjunctiva are pink and non-injected, sclera clear OROPHARYNX:no exudate, no erythema and lips, buccal mucosa, and tongue normal  LUNGS: clear to auscultation and percussion with normal breathing effort HEART: regular rate & rhythm and no murmurs and no lower extremity edema  ABDOMEN: Positive bowel sounds, soft, nontender Musculoskeletal:no cyanosis of digits and no clubbing  NEURO: alert & oriented x 3 with fluent speech, no focal motor/sensory deficits  LABORATORY DATA:  I have reviewed the data as listed CMP Latest Ref Rng & Units 02/22/2021  02/21/2021 02/20/2021  Glucose 70 - 99 mg/dL 67(L) 81 84  BUN 8 - 23 mg/dL 7(L) 9 9  Creatinine 0.44 - 1.00 mg/dL 0.58 0.51 0.44  Sodium 135 - 145 mmol/L 135 134(L) 134(L)  Potassium 3.5 - 5.1 mmol/L 3.8 2.7(LL) 2.8(L)  Chloride 98 - 111 mmol/L 106 102 101  CO2 22 - 32 mmol/L $RemoveB'22 26 27  'MlkllQoB$ Calcium 8.9 - 10.3 mg/dL 7.9(L) 7.9(L) 7.7(L)  Total Protein 6.5 - 8.1 g/dL - - -  Total Bilirubin 0.3 - 1.2 mg/dL - - -  Alkaline Phos 38 - 126 U/L - - -  AST 15 - 41 U/L - - -  ALT 0 - 44 U/L - - -    Lab Results  Component Value Date   WBC 25.8 (H) 02/22/2021   HGB 9.3 (L) 02/22/2021   HCT 29.4 (L) 02/22/2021   MCV 89.6 02/22/2021   PLT 278 02/22/2021   NEUTROABS 17.5 (H) 02/22/2021    DG Chest 2 View  Result Date: 02/18/2021 CLINICAL DATA:  History of asthma and lymphoma EXAM: CHEST - 2 VIEW COMPARISON:  05/07/2020 FINDINGS: Cardiac shadow is within normal limits. Right-sided chest wall port is noted in satisfactory position. Minimal scarring is noted in the bases bilaterally. No focal infiltrate or effusion is seen. Degenerative change of the thoracic spine is noted. IMPRESSION: No acute abnormality noted. Electronically Signed   By: Inez Catalina M.D.   On: 02/18/2021 19:25   CT ABDOMEN PELVIS W CONTRAST  Result Date: 02/11/2021 CLINICAL DATA:  History of large B-cell lymphoma involving the cecum and distal ileum. EXAM: CT ABDOMEN AND PELVIS WITH CONTRAST TECHNIQUE: Multidetector CT imaging of the abdomen and pelvis was performed using the standard protocol following bolus administration of intravenous contrast. CONTRAST:  59mL OMNIPAQUE IOHEXOL 300 MG/ML  SOLN COMPARISON:  CT scan 10/21/2020 FINDINGS: Lower chest: Very small bilateral pleural effusions with minimal overlying atelectasis. No worrisome pulmonary lesions. Probable radiation changes involving the anterior aspect of the right lung and there are surgical changes involving the right breast. The heart is within normal limits in size. No  pericardial effusion. Hepatobiliary: No hepatic lesions are identified. Gallbladder is slightly distended no definite gallstones or findings for acute cholecystitis. No common bile duct dilatation. Pancreas: No mass, inflammation or ductal dilatation. Spleen: Normal size. No focal lesions. Adrenals/Urinary Tract: Adrenal glands and kidneys are unremarkable. The bladder is unremarkable. Stomach/Bowel: The stomach and duodenum are unremarkable. There is a fairly high-grade small-bowel obstruction noted with markedly dilated small bowel loops and air-fluid/air contrast levels throughout. The colon is decompressed. The small-bowel obstruction appears to be at the distal ileum. There is a very irregular masslike area of scarring changes, fibrosis or treated lymphoid tissue in the right lower quadrant. Surrounding adhesions or scarring in this area. The cecum now appears normal. No residual bowel wall thickening. Vascular/Lymphatic: Stable atherosclerotic calcifications involving the aorta and iliac arteries but no aneurysm. No mesenteric or retroperitoneal mass or adenopathy. Reproductive: Surgically absent. Other: Small amount of free abdominal/free pelvic fluid likely related to the patient has small-bowel obstruction. Musculoskeletal: No significant bony findings. IMPRESSION: 1. Fairly high-grade small-bowel obstruction as detailed above. This appears to be due to a very irregular masslike area  of scarring change or treated tumor in the right lower quadrant with surrounding fibrosis and adhesions. 2. No residual bowel wall thickening to suggest persistent lymphoma involving the cecum or terminal ileum. 3. Small amount of free abdominal/free pelvic fluid likely related to the small-bowel obstruction. 4. Very small bilateral pleural effusions with minimal overlying atelectasis. These results will be called to the ordering clinician or representative by the Radiologist Assistant, and communication documented in the PACS  or [employer]. Aortic Atherosclerosis (ICD10-I70.0). Electronically Signed   By: Marijo Sanes M.D.   On: 02/11/2021 19:37   DG Abd 2 Views  Result Date: 02/13/2021 CLINICAL DATA:  Small-bowel obstruction on CT EXAM: ABDOMEN - 2 VIEW COMPARISON:  CT of 2 days ago FINDINGS: Supine and upright views. The upright view demonstrates no free intraperitoneal air. Right hemidiaphragm elevation. Cardiomegaly. No upper abdominal air-fluid levels. The supine view demonstrates normal caliber gas-filled colon. Small bowel loops measure up to 4.0 cm, minimally decreased compared to the prior CT. IMPRESSION: Improvement in small-bowel obstruction pattern. No free intraperitoneal air or other acute complication. Electronically Signed   By: Abigail Miyamoto M.D.   On: 02/13/2021 10:16    ASSESSMENT AND PLAN: 1) large B-cell lymphoma -CT abdomen/pelvis with contrast 10/21/2020 - "Acute colitis/enteritis of the cecum/ascending colon and the associated terminal loops of ileum. As was recently described on CT imaging, the anatomy of distal ileum, ileocecal valve, cecum is atypical and correlation with patient's prior surgical record would be useful. The acute inflammatory changes contribute to at least a partial small bowel obstruction. Primary concern of this inflammatory cystic mass/bowel of the right lower quadrant is malignancy given that the posterior margin is inseparable from the psoas muscle, adnexa, and the right ureter. Correlation with CEA may be useful. Inflammatory/infectious changes are also a consideration." -Colonoscopy performed 11/04/2020- "An infiltrative and ulcerated partially obstructing large mass was found at 85 cm proximal to the anus. 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  Labs from this morning been reviewed.  Her WBC is up to 25.8 likely due to recent G-CSF.  Hemoglobin stable at 9.3 and platelets remain normal at 278,000.  Her potassium and magnesium have normalized. -- She has not had any fevers or chills in the past 24 hours.  Cultures remain negative.  Okay to discontinue IV antibiotics at this point. --transfusion for Hgb <7.0 (or <8.0 and symptomatic) or Plt <10 --From our standpoint, okay to discharge to home.  She has outpatient follow-up already scheduled in our office on Friday.  She was advised to keep this appointment.  Mikey Bussing, DNP, AGPCNP-BC, AOCNP   LOS: 4 days

## 2021-02-22 NOTE — Discharge Summary (Signed)
Physician Discharge Summary  Lindsey Price YNW:[id number] DOB: 04/10/1946 DOA: 02/18/2021  PCP: Leonides Sake, MD  Admit date: 02/18/2021 Discharge date: 02/22/2021  Admitted From: Home  Disposition:  Home    Recommendations for Outpatient Follow-up:  Follow up with PCP in 1-2 weeks Please obtain BMP/CBC in one week Patient will need labs on Friday CBC and Bmet. Mg level.   Home Health: None  Discharge Condition: Stable.  CODE STATUS: Full Code.  Diet recommendation: Soft Diet.   Brief/Interim Summary: 75 year old with past medical history significant for large B-cell lymphoma undergoing chemo, history of breast cancer, hypertension, hypothyroidism who presents as a direct admission from oncology office for further evaluation of neutropenic fever. She was found to have to 100.6 white blood cells 0.9, neutrophil count 600, platelets 118.   Patient admitted for neutropenic fever.  No focal symptoms or signs of infection.   1-Neutropenic Fever: -Patient Presents with Fever, WBC 0.9 in setting of recent chemo. -Chest x-ray negative for pneumonia, UA negative. -C diff negative. -Follow blood cultures, urine culture No growth to date. -Patient completed 5 days of IV  vancomycin and cefepime -WBC increase to  12--25 likely related to recent G-CSF -Afebrile. Stable for discharge.    2--Large B-cell lymphoma; Follow by oncology, Dr.  Lorenso Courier.  Undergoing chemotherapy.   3-Hypothyroidism: Continue with Synthroid.   4-B12 deficiency: Continue with B12 injections. Received injection this admission.  5-GERD: Continue with PPI 6-Asthma; Continue with inhaler. 7-Insomnia; Continue with Remeron. 8-History of SBO: She had a CT abdomen 02/11/2021 that show fairly high-grade small bowel obstruction which appears to be due to a very irregular masslike area of his scaring changes or treated tumor in the right lower quadrant with surrounding fibrosis and adhesions.  Resume  miralax. Tolerating diet.  She will hold Miralax if she is having diarrhea.  Hypokalemia; Replaced. Discharge on oral supplement.  Hypomagnesemia; Replete IV during hospitalization.Marland Kitchen Discharge on oral magnesium supplement.           Discharge Diagnoses:  Principal Problem:   Neutropenic fever ([place]) Active Problems:   Allergic rhinitis   HTN (hypertension)   Hyperlipemia, mixed   Hypothyroidism   GERD (gastroesophageal reflux disease)   Vitamin D deficiency   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HCC)   B12 deficiency   Iron deficiency anemia due to chronic blood loss    Discharge Instructions  Discharge Instructions     Diet - low sodium heart healthy   Complete by: As directed    Increase activity slowly   Complete by: As directed       Allergies as of 02/22/2021       Reactions   Advair Diskus [fluticasone-salmeterol] Other (See Comments)   Hoarseness   Neosporin [neomycin-polymyxin-gramicidin] Itching        Medication List     STOP taking these medications    ibuprofen 200 MG tablet Commonly known as: ADVIL       TAKE these medications    albuterol 108 (90 Base) MCG/ACT inhaler Commonly known as: ProAir HFA Inhale 1-2 puffs into the lungs every 6 (six) hours as needed for wheezing or shortness of breath.   alum & mag hydroxide-simeth 200-200-20 MG/5ML suspension Commonly known as: MAALOX/MYLANTA Take 30 mLs by mouth every 4 (four) hours as needed for indigestion.   atorvastatin 10 MG tablet Commonly known as: LIPITOR Take 1 tablet (10 mg total) by mouth at bedtime.   B COMPLEX PO Take 1 tablet by mouth daily with breakfast.  calcium carbonate 1250 (500 Ca) MG tablet Commonly known as: OS-CAL - dosed in mg of elemental calcium Take 1,250 mg by mouth daily.   cyanocobalamin 1000 MCG/ML injection Commonly known as: (VITAMIN B-12) Inject 1 mL (1,000 mcg total) into the skin every 30 (thirty) days.   dexamethasone 4 MG tablet Commonly known as:  DECADRON Take 2 tablets (8 mg total) by mouth See admin instructions. Take 8 mg by mouth two times a day with a meal starting the day after chemotherapy- for 3 days   ezetimibe 10 MG tablet Commonly known as: Zetia Take 1 tablet (10 mg total) by mouth at bedtime.   feeding supplement Liqd Take 237 mLs by mouth 2 (two) times daily between meals. What changed:  when to take this reasons to take this   Flovent HFA 44 MCG/ACT inhaler Generic drug: fluticasone Inhale 2 puffs into the lungs daily.   fluticasone 50 MCG/ACT nasal spray Commonly known as: FLONASE Place 2 sprays into both nostrils daily.   HYDROcodone-acetaminophen 5-325 MG tablet Commonly known as: NORCO/VICODIN Take 1-2 tablets by mouth every 6 (six) hours as needed for moderate pain or severe pain.   levocetirizine 5 MG tablet Commonly known as: XYZAL Take 5 mg by mouth daily.   levothyroxine 88 MCG tablet Commonly known as: SYNTHROID Take 1 tablet (88 mcg total) by mouth every other day. Start taking on: February 23, 2021 What changed:  how much to take how to take this when to take this additional instructions   levothyroxine 100 MCG tablet Commonly known as: SYNTHROID Take 1 tablet (100 mcg total) by mouth every other day. Start taking on: February 24, 2021 What changed:  how much to take how to take this when to take this additional instructions   lidocaine-prilocaine cream Commonly known as: EMLA Apply 1 application topically as needed (as directed). What changed:  when to take this reasons to take this   LORazepam 0.5 MG tablet Commonly known as: Ativan Take 1 tablet (0.5 mg total) by mouth See admin instructions. Take 0.5 mg by mouth at bedtime nightly while hospitalized   magic mouthwash w/lidocaine Soln Take 5 mLs by mouth 4 (four) times daily as needed for mouth pain. Swish and swallow 1 part diphenhydramine 12.5 mg per 5 mL elixir (29ml), 1 part Maalox (do not substitute Kaopectate) 60ml, 1  part 2% viscous lidocaine (40 ml), 1 part nystatin 500000 units/ml (43ml) and 1 part distilled water (55ml). Plz dispense 200 ml   Magnesium 400 MG Caps Take 1 tablet by mouth 2 (two) times daily.   mirtazapine 15 MG tablet Commonly known as: REMERON Take 1 tablet (15 mg total) by mouth at bedtime.   montelukast 10 MG tablet Commonly known as: SINGULAIR Take 1 tablet (10 mg total) by mouth daily.   multivitamin with minerals Tabs tablet Take 1 tablet by mouth daily.   NexIUM 20 MG capsule Generic drug: esomeprazole Take 20 mg by mouth 2 (two) times daily before a meal.   ondansetron 8 MG tablet Commonly known as: Zofran Take 1 tablet (8 mg total) by mouth See admin instructions. Take 8 mg by mouth two times a day starting the day after chemo for- 3 days, then, as needed, two times a day as needed for nausea or vomiting   polyethylene glycol 17 g packet Commonly known as: MIRALAX / GLYCOLAX Take 17 g by mouth daily.   potassium chloride SA 20 MEQ tablet Commonly known as: KLOR-CON Take 2 tablets (40  mEq total) by mouth daily.   prochlorperazine 10 MG tablet Commonly known as: COMPAZINE Take 1 tablet (10 mg total) by mouth every 6 (six) hours as needed for vomiting or nausea.   senna-docusate 8.6-50 MG tablet Commonly known as: Senokot-S Take 1 tablet by mouth 2 (two) times daily.   sodium bicarbonate/sodium chloride Soln 1 application by Mouth Rinse route See admin instructions. Use as directed three to four times daily   venlafaxine XR 37.5 MG 24 hr capsule Commonly known as: EFFEXOR-XR Take 1 capsule (37.5 mg total) by mouth daily with breakfast.   Vitamin D (Ergocalciferol) 1.25 MG (50000 UNIT) Caps capsule Commonly known as: DRISDOL Take 1 capsule (50,000 Units total) by mouth every Friday.        Allergies  Allergen Reactions   Advair Diskus [Fluticasone-Salmeterol] Other (See Comments)    Hoarseness   Neosporin [Neomycin-Polymyxin-Gramicidin] Itching     Consultations: Oncology    Procedures/Studies: DG Chest 2 View  Result Date: 02/18/2021 CLINICAL DATA:  History of asthma and lymphoma EXAM: CHEST - 2 VIEW COMPARISON:  05/07/2020 FINDINGS: Cardiac shadow is within normal limits. Right-sided chest wall port is noted in satisfactory position. Minimal scarring is noted in the bases bilaterally. No focal infiltrate or effusion is seen. Degenerative change of the thoracic spine is noted. IMPRESSION: No acute abnormality noted. Electronically Signed   By: Inez Catalina M.D.   On: 02/18/2021 19:25   CT ABDOMEN PELVIS W CONTRAST  Result Date: 02/11/2021 CLINICAL DATA:  History of large B-cell lymphoma involving the cecum and distal ileum. EXAM: CT ABDOMEN AND PELVIS WITH CONTRAST TECHNIQUE: Multidetector CT imaging of the abdomen and pelvis was performed using the standard protocol following bolus administration of intravenous contrast. CONTRAST:  69mL OMNIPAQUE IOHEXOL 300 MG/ML  SOLN COMPARISON:  CT scan 10/21/2020 FINDINGS: Lower chest: Very small bilateral pleural effusions with minimal overlying atelectasis. No worrisome pulmonary lesions. Probable radiation changes involving the anterior aspect of the right lung and there are surgical changes involving the right breast. The heart is within normal limits in size. No pericardial effusion. Hepatobiliary: No hepatic lesions are identified. Gallbladder is slightly distended no definite gallstones or findings for acute cholecystitis. No common bile duct dilatation. Pancreas: No mass, inflammation or ductal dilatation. Spleen: Normal size. No focal lesions. Adrenals/Urinary Tract: Adrenal glands and kidneys are unremarkable. The bladder is unremarkable. Stomach/Bowel: The stomach and duodenum are unremarkable. There is a fairly high-grade small-bowel obstruction noted with markedly dilated small bowel loops and air-fluid/air contrast levels throughout. The colon is decompressed. The small-bowel obstruction  appears to be at the distal ileum. There is a very irregular masslike area of scarring changes, fibrosis or treated lymphoid tissue in the right lower quadrant. Surrounding adhesions or scarring in this area. The cecum now appears normal. No residual bowel wall thickening. Vascular/Lymphatic: Stable atherosclerotic calcifications involving the aorta and iliac arteries but no aneurysm. No mesenteric or retroperitoneal mass or adenopathy. Reproductive: Surgically absent. Other: Small amount of free abdominal/free pelvic fluid likely related to the patient has small-bowel obstruction. Musculoskeletal: No significant bony findings. IMPRESSION: 1. Fairly high-grade small-bowel obstruction as detailed above. This appears to be due to a very irregular masslike area of scarring change or treated tumor in the right lower quadrant with surrounding fibrosis and adhesions. 2. No residual bowel wall thickening to suggest persistent lymphoma involving the cecum or terminal ileum. 3. Small amount of free abdominal/free pelvic fluid likely related to the small-bowel obstruction. 4. Very small bilateral pleural effusions  with minimal overlying atelectasis. These results will be called to the ordering clinician or representative by the Radiologist Assistant, and communication documented in the PACS or [employer]. Aortic Atherosclerosis (ICD10-I70.0). Electronically Signed   By: Marijo Sanes M.D.   On: 02/11/2021 19:37   DG Abd 2 Views  Result Date: 02/13/2021 CLINICAL DATA:  Small-bowel obstruction on CT EXAM: ABDOMEN - 2 VIEW COMPARISON:  CT of 2 days ago FINDINGS: Supine and upright views. The upright view demonstrates no free intraperitoneal air. Right hemidiaphragm elevation. Cardiomegaly. No upper abdominal air-fluid levels. The supine view demonstrates normal caliber gas-filled colon. Small bowel loops measure up to 4.0 cm, minimally decreased compared to the prior CT. IMPRESSION: Improvement in small-bowel  obstruction pattern. No free intraperitoneal air or other acute complication. Electronically Signed   By: Abigail Miyamoto M.D.   On: 02/13/2021 10:16     Subjective: She is feeling well, report two BM, [place].  She was able to eat some breakfast.   Discharge Exam: Vitals:   02/21/21 2122 02/22/21 0429  BP: 126/60 123/70  Pulse: 99 96  Resp: 18 16  Temp: 98.3 F (36.8 C) 98.2 F (36.8 C)  SpO2: 93% 96%     General: Pt is alert, awake, not in acute distress Cardiovascular: RRR, S1/S2 +, no rubs, no gallops Respiratory: CTA bilaterally, no wheezing, no rhonchi Abdominal: Soft, NT, ND, bowel sounds + Extremities: no edema, no cyanosis    The results of significant diagnostics from this hospitalization (including imaging, microbiology, ancillary and laboratory) are listed below for reference.     Microbiology: Recent Results (from the past 240 hour(s))  Culture, blood (Routine X 2) w Reflex to ID Panel     Status: None (Preliminary result)   Collection Time: 02/18/21  5:38 PM   Specimen: BLOOD  Result Value Ref Range Status   Specimen Description   Final    BLOOD LEFT ANTECUBITAL Performed at [facility] [address] [place], [place] [address]    Special Requests   Final    BOTTLES DRAWN AEROBIC AND ANAEROBIC Blood Culture adequate volume Performed at [facility] [address] [place], [place] [address]    Culture   Final    NO GROWTH 4 DAYS Performed at [facility], [place] [address], [place], [place] [address]    Report Status PENDING  Incomplete  Culture, blood (Routine X 2) w Reflex to ID Panel     Status: None (Preliminary result)   Collection Time: 02/18/21  5:38 PM   Specimen: BLOOD  Result Value Ref Range Status   Specimen Description   Final    BLOOD UMBILICAL ARTERY CATHETER Performed at [facility] [address], [place], [place] [address]    Special Requests   Final    BOTTLES DRAWN  AEROBIC AND ANAEROBIC Blood Culture adequate volume Performed at [facility] [address] [place], [place] [address]    Culture   Final    NO GROWTH 4 DAYS Performed at [facility], [place] [address] [place], [place] [address]    Report Status PENDING  Incomplete  C Difficile Quick Screen w PCR reflex     Status: Abnormal   Collection Time: 02/18/21  6:10 PM   Specimen: STOOL  Result Value Ref Range Status   C Diff antigen NON REACTIVE (A) NEGATIVE Final   C Diff toxin NON REACTIVE (A) NEGATIVE Final   C Diff  interpretation NEGATIVE  Final    Comment: Performed at [facility] [address] [place], [place] [address]  SARS CORONAVIRUS 2 (TAT 6-24 HRS) Nasopharyngeal STOOL     Status: None   Collection Time: 02/18/21  6:10 PM   Specimen: STOOL; Nasopharyngeal  Result Value Ref Range Status   SARS Coronavirus 2 NEGATIVE NEGATIVE Final    Comment: (NOTE) SARS-CoV-2 target nucleic acids are NOT DETECTED.  The SARS-CoV-2 RNA is generally detectable in upper and lower respiratory specimens during the acute phase of infection. Negative results do not preclude SARS-CoV-2 infection, do not rule out co-infections with other pathogens, and should not be used as the sole basis for treatment or other patient management decisions. Negative results must be combined with clinical observations, patient history, and epidemiological information. The expected result is Negative.  Fact Sheet for Patients: SugarRoll.be  Fact Sheet for Healthcare Providers: https://www.woods-mathews.com/  This test is not yet approved or cleared by the [place] FDA and  has been authorized for detection and/or diagnosis of SARS-CoV-2 by FDA under an Emergency Use Authorization (EUA). This EUA will remain  in effect (meaning this test can be used) for the duration of the COVID-19 declaration under Se ction 564(b)(1) of  the Act, 21 U.S.C. section 360bbb-3(b)(1), unless the authorization is terminated or revoked sooner.  Performed at [facility], [place] [address], [place], [place] [address]   Urine Culture     Status: None   Collection Time: 02/18/21  8:58 PM   Specimen: Urine, Random  Result Value Ref Range Status   Specimen Description   Final    URINE, RANDOM Performed at [facility] [address] [place], [place] [address]    Special Requests   Final    NONE Performed at [facility], [place] [address] [place], [place] [address]    Culture   Final    NO GROWTH Performed at [facility], [place] [address] [place], [place] [address]    Report Status 02/20/2021 FINAL  Final     Labs: BNP (last 3 results) No results for input(s): BNP in the last 8760 hours. Basic Metabolic Panel: Recent Labs  Lab 02/18/21 1321 02/19/21 0524 02/20/21 0628 02/21/21 0513 02/22/21 0505  NA 137 136 134* 134* 135  K 3.5 3.1* 2.8* 2.7* 3.8  CL 102 104 101 102 106  CO2 27 27 27 26 22   GLUCOSE 99 89 84 81 67*  BUN 13 11 9 9  7*  CREATININE 0.58 0.62 0.44 0.51 0.58  CALCIUM 8.3* 8.0* 7.7* 7.9* 7.9*  MG  --   --   --  1.1* 2.1   Liver Function Tests: Recent Labs  Lab 02/18/21 1321 02/19/21 0524  AST 18 14*  ALT 30 24  ALKPHOS 61 54  BILITOT 0.8 1.0  PROT 5.2* 4.8*  ALBUMIN 3.0* 2.6*   No results for input(s): LIPASE, AMYLASE in the last 168 hours. No results for input(s): AMMONIA in the last 168 hours. CBC: Recent Labs  Lab 02/18/21 1321 02/19/21 0524 02/20/21 0628 02/21/21 0859 02/22/21 0802  WBC 0.9* 0.7* 1.3* 12.4* 25.8*  NEUTROABS 0.6* 0.2* 0.3* 7.3 17.5*  HGB 8.9* 8.4* 8.0* 9.5* 9.3*  HCT 27.0* 26.0* 24.4* 29.4* 29.4*  MCV 87.4 90.0 88.7 89.1 89.6  PLT 118* 109* 123* 239 278   Cardiac Enzymes: No results for input(s): CKTOTAL, CKMB, CKMBINDEX, TROPONINI in the last 168 hours. BNP: Invalid input(s): POCBNP CBG:  Recent Labs  Lab  02/22/21 0730  GLUCAP 63*   D-Dimer No results for input(s): DDIMER in the last 72 hours. Hgb A1c No results for input(s): HGBA1C in the last 72 hours. Lipid Profile No results for input(s): CHOL, HDL, LDLCALC, TRIG, CHOLHDL, LDLDIRECT in the last 72 hours. Thyroid function studies No results for input(s): TSH, T4TOTAL, T3FREE, THYROIDAB in the last 72 hours.  Invalid input(s): FREET3 Anemia work up No results for input(s): VITAMINB12, FOLATE, FERRITIN, TIBC, IRON, RETICCTPCT in the last 72 hours. Urinalysis    Component Value Date/Time   COLORURINE YELLOW 02/18/2021 2058   APPEARANCEUR CLEAR 02/18/2021 2058   LABSPEC 1.035 (H) 02/18/2021 2058   PHURINE 5.0 02/18/2021 2058   GLUCOSEU NEGATIVE 02/18/2021 2058   HGBUR NEGATIVE 02/18/2021 2058   BILIRUBINUR SMALL (A) 02/18/2021 2058   BILIRUBINUR neg 09/09/2019 0846   KETONESUR NEGATIVE 02/18/2021 2058   PROTEINUR NEGATIVE 02/18/2021 2058   UROBILINOGEN 0.2 09/09/2019 0846   NITRITE NEGATIVE 02/18/2021 2058   LEUKOCYTESUR TRACE (A) 02/18/2021 2058   Sepsis Labs Invalid input(s): PROCALCITONIN,  WBC,  LACTICIDVEN Microbiology Recent Results (from the past 240 hour(s))  Culture, blood (Routine X 2) w Reflex to ID Panel     Status: None (Preliminary result)   Collection Time: 02/18/21  5:38 PM   Specimen: BLOOD  Result Value Ref Range Status   Specimen Description   Final    BLOOD LEFT ANTECUBITAL Performed at [facility], [place] [address] [place], [place] [address]    Special Requests   Final    BOTTLES DRAWN AEROBIC AND ANAEROBIC Blood Culture adequate volume Performed at [facility] [address] [place], [place] [address]    Culture   Final    NO GROWTH 4 DAYS Performed at [facility], [place] [address], [place], [place] [address]    Report Status PENDING  Incomplete  Culture, blood (Routine X 2) w Reflex to ID Panel     Status: None (Preliminary result)   Collection  Time: 02/18/21  5:38 PM   Specimen: BLOOD  Result Value Ref Range Status   Specimen Description   Final    BLOOD UMBILICAL ARTERY CATHETER Performed at [facility] [address], [place], [place] [address]    Special Requests   Final    BOTTLES DRAWN AEROBIC AND ANAEROBIC Blood Culture adequate volume Performed at [facility] [address] [place], [place] [address]    Culture   Final    NO GROWTH 4 DAYS Performed at [facility], [place] [address], [place],  [address]    Report Status PENDING  Incomplete  C Difficile Quick Screen w PCR reflex     Status: Abnormal   Collection Time: 02/18/21  6:10 PM   Specimen: STOOL  Result Value Ref Range Status   C Diff antigen NON REACTIVE (A) NEGATIVE Final   C Diff toxin NON REACTIVE (A) NEGATIVE Final   C Diff interpretation NEGATIVE  Final    Comment: Performed at [facility], [place] [address] [place], [place] [address]  SARS CORONAVIRUS 2 (TAT 6-24 HRS) Nasopharyngeal STOOL     Status: None   Collection Time: 02/18/21  6:10 PM   Specimen: STOOL; Nasopharyngeal  Result Value Ref Range Status   SARS Coronavirus 2 NEGATIVE NEGATIVE Final    Comment: (NOTE) SARS-CoV-2 target nucleic acids are NOT DETECTED.  The SARS-CoV-2 RNA is generally detectable in  upper and lower respiratory specimens during the acute phase of infection. Negative results do not preclude SARS-CoV-2 infection, do not rule out co-infections with other pathogens, and should not be used as the sole basis for treatment or other patient management decisions. Negative results must be combined with clinical observations, patient history, and epidemiological information. The expected result is Negative.  Fact Sheet for Patients: SugarRoll.be  Fact Sheet for Healthcare Providers: https://www.woods-mathews.com/  This test is not yet approved or cleared by the  [place] FDA and  has been authorized for detection and/or diagnosis of SARS-CoV-2 by FDA under an Emergency Use Authorization (EUA). This EUA will remain  in effect (meaning this test can be used) for the duration of the COVID-19 declaration under Se ction 564(b)(1) of the Act, 21 U.S.C. section 360bbb-3(b)(1), unless the authorization is terminated or revoked sooner.  Performed at [facility], [place] [address], [place], [place] [address]   Urine Culture     Status: None   Collection Time: 02/18/21  8:58 PM   Specimen: Urine, Random  Result Value Ref Range Status   Specimen Description   Final    URINE, RANDOM Performed at [facility] [address] [place], [place] [address]    Special Requests   Final    NONE Performed at [facility], [place] [address], [place], [place] [address]    Culture   Final    NO GROWTH Performed at [facility], [place] [address], [place],  [address]    Report Status 02/20/2021 FINAL  Final     Time coordinating discharge: 40 minutes  SIGNED:   Elmarie Shiley, MD  [facility]

## 2021-02-23 LAB — CULTURE, BLOOD (ROUTINE X 2)
Culture: NO GROWTH
Culture: NO GROWTH
Special Requests: ADEQUATE
Special Requests: ADEQUATE

## 2021-02-23 NOTE — Progress Notes (Signed)
Pt set up to be contacted by admitting on 02/28/21 for inpt chemo admission. E-mail sent. Covid test scheduled.

## 2021-02-24 ENCOUNTER — Telehealth: Payer: Self-pay

## 2021-02-24 ENCOUNTER — Other Ambulatory Visit: Payer: Self-pay | Admitting: Physician Assistant

## 2021-02-24 DIAGNOSIS — C8510 Unspecified B-cell lymphoma, unspecified site: Secondary | ICD-10-CM

## 2021-02-24 NOTE — Telephone Encounter (Signed)
Nutrition  Patient identified on Malnutrition Screening report for weight loss   Called patient for nutrition assessment.  No answer. Left message with call back number  Lashauna Arpin B. Zenia Resides, [place], [place] Registered Dietitian [phone] (mobile)

## 2021-02-25 ENCOUNTER — Inpatient Hospital Stay: Payer: PPO

## 2021-02-25 ENCOUNTER — Inpatient Hospital Stay (HOSPITAL_BASED_OUTPATIENT_CLINIC_OR_DEPARTMENT_OTHER): Payer: PPO | Admitting: Physician Assistant

## 2021-02-25 ENCOUNTER — Other Ambulatory Visit (HOSPITAL_COMMUNITY)
Admission: RE | Admit: 2021-02-25 | Discharge: 2021-02-25 | Disposition: A | Discharge status: Home / Self Care | Payer: PPO | Source: Ambulatory Visit | Attending: Hematology | Admitting: Hematology

## 2021-02-25 ENCOUNTER — Other Ambulatory Visit: Payer: Self-pay

## 2021-02-25 VITALS — BP 131/63 | HR 101 | Temp 98.5°F | Resp 19 | Wt 123.8 lb

## 2021-02-25 DIAGNOSIS — Z01812 Encounter for preprocedural laboratory examination: Secondary | ICD-10-CM | POA: Insufficient documentation

## 2021-02-25 DIAGNOSIS — Z170 Estrogen receptor positive status [ER+]: Secondary | ICD-10-CM

## 2021-02-25 DIAGNOSIS — C8510 Unspecified B-cell lymphoma, unspecified site: Secondary | ICD-10-CM

## 2021-02-25 DIAGNOSIS — Z95828 Presence of other vascular implants and grafts: Secondary | ICD-10-CM

## 2021-02-25 DIAGNOSIS — C50412 Malignant neoplasm of upper-outer quadrant of left female breast: Secondary | ICD-10-CM

## 2021-02-25 DIAGNOSIS — Z20822 Contact with and (suspected) exposure to covid-19: Secondary | ICD-10-CM | POA: Insufficient documentation

## 2021-02-25 LAB — CBC WITH DIFFERENTIAL (CANCER CENTER ONLY)
Abs Immature Granulocytes: 6.24 10*3/uL — ABNORMAL HIGH (ref 0.00–0.07)
Basophils Absolute: 0.1 10*3/uL (ref 0.0–0.1)
Basophils Relative: 0 %
Eosinophils Absolute: 0.1 10*3/uL (ref 0.0–0.5)
Eosinophils Relative: 0 %
HCT: 25.8 % — ABNORMAL LOW (ref 36.0–46.0)
Hemoglobin: 8.4 g/dL — ABNORMAL LOW (ref 12.0–15.0)
Immature Granulocytes: 23 %
Lymphocytes Relative: 4 %
Lymphs Abs: 1.1 10*3/uL (ref 0.7–4.0)
MCH: 28.7 pg (ref 26.0–34.0)
MCHC: 32.6 g/dL (ref 30.0–36.0)
MCV: 88.1 fL (ref 80.0–100.0)
Monocytes Absolute: 3 10*3/uL — ABNORMAL HIGH (ref 0.1–1.0)
Monocytes Relative: 11 %
Neutro Abs: 16.9 10*3/uL — ABNORMAL HIGH (ref 1.7–7.7)
Neutrophils Relative %: 62 %
Platelet Count: 284 10*3/uL (ref 150–400)
RBC: 2.93 MIL/uL — ABNORMAL LOW (ref 3.87–5.11)
RDW: 19 % — ABNORMAL HIGH (ref 11.5–15.5)
WBC Count: 27.3 10*3/uL — ABNORMAL HIGH (ref 4.0–10.5)
nRBC: 0.1 % (ref 0.0–0.2)

## 2021-02-25 LAB — CMP (CANCER CENTER ONLY)
ALT: 22 U/L (ref 0–44)
AST: 28 U/L (ref 15–41)
Albumin: 2.6 g/dL — ABNORMAL LOW (ref 3.5–5.0)
Alkaline Phosphatase: 152 U/L — ABNORMAL HIGH (ref 38–126)
Anion gap: 10 (ref 5–15)
BUN: 10 mg/dL (ref 8–23)
CO2: 23 mmol/L (ref 22–32)
Calcium: 8 mg/dL — ABNORMAL LOW (ref 8.9–10.3)
Chloride: 107 mmol/L (ref 98–111)
Creatinine: 0.55 mg/dL (ref 0.44–1.00)
GFR, Estimated: >60 mL/min (ref >60)
Glucose, Bld: 108 mg/dL — ABNORMAL HIGH (ref 70–99)
Potassium: 4 mmol/L (ref 3.5–5.1)
Sodium: 140 mmol/L (ref 135–145)
Total Bilirubin: 0.6 mg/dL (ref 0.3–1.2)
Total Protein: 5 g/dL — ABNORMAL LOW (ref 6.5–8.1)

## 2021-02-25 LAB — SAMPLE TO BLOOD BANK

## 2021-02-25 LAB — SARS CORONAVIRUS 2 (TAT 6-24 HRS): SARS Coronavirus 2: NEGATIVE

## 2021-02-25 MED ORDER — HEPARIN SOD (PORK) LOCK FLUSH 100 UNIT/ML IV SOLN
500.0000 [IU] | Freq: Once | INTRAVENOUS | Status: AC | PRN
  Administered 2021-02-25: 500 [IU]
  Filled 2021-02-25: qty 5

## 2021-02-25 MED ORDER — SODIUM CHLORIDE 0.9% FLUSH
10.0000 mL | INTRAVENOUS | Status: DC | PRN
  Administered 2021-02-25: 10 mL
  Filled 2021-02-25: qty 10

## 2021-02-26 ENCOUNTER — Inpatient Hospital Stay: Payer: PPO

## 2021-02-28 ENCOUNTER — Encounter: Payer: Self-pay | Admitting: Hematology and Oncology

## 2021-02-28 ENCOUNTER — Other Ambulatory Visit: Payer: Self-pay | Admitting: Hematology and Oncology

## 2021-02-28 ENCOUNTER — Inpatient Hospital Stay (HOSPITAL_COMMUNITY)
Admission: RE | Admit: 2021-02-28 | Discharge: 2021-03-04 | DRG: 847 | Disposition: A | Discharge status: Home / Self Care | Payer: PPO | Source: Ambulatory Visit | Attending: Hematology | Admitting: Hematology

## 2021-02-28 ENCOUNTER — Other Ambulatory Visit: Payer: Self-pay

## 2021-02-28 ENCOUNTER — Encounter (HOSPITAL_COMMUNITY): Payer: Self-pay | Admitting: Hematology

## 2021-02-28 ENCOUNTER — Encounter: Payer: Self-pay | Admitting: Hematology

## 2021-02-28 DIAGNOSIS — I2510 Atherosclerotic heart disease of native coronary artery without angina pectoris: Secondary | ICD-10-CM | POA: Diagnosis not present

## 2021-02-28 DIAGNOSIS — D649 Anemia, unspecified: Secondary | ICD-10-CM

## 2021-02-28 DIAGNOSIS — Z20822 Contact with and (suspected) exposure to covid-19: Secondary | ICD-10-CM | POA: Diagnosis not present

## 2021-02-28 DIAGNOSIS — K760 Fatty (change of) liver, not elsewhere classified: Secondary | ICD-10-CM | POA: Diagnosis not present

## 2021-02-28 DIAGNOSIS — Z87891 Personal history of nicotine dependence: Secondary | ICD-10-CM

## 2021-02-28 DIAGNOSIS — Z853 Personal history of malignant neoplasm of breast: Secondary | ICD-10-CM

## 2021-02-28 DIAGNOSIS — F419 Anxiety disorder, unspecified: Secondary | ICD-10-CM | POA: Diagnosis present

## 2021-02-28 DIAGNOSIS — J45909 Unspecified asthma, uncomplicated: Secondary | ICD-10-CM | POA: Diagnosis not present

## 2021-02-28 DIAGNOSIS — D509 Iron deficiency anemia, unspecified: Secondary | ICD-10-CM | POA: Diagnosis present

## 2021-02-28 DIAGNOSIS — K219 Gastro-esophageal reflux disease without esophagitis: Secondary | ICD-10-CM | POA: Diagnosis not present

## 2021-02-28 DIAGNOSIS — D0511 Intraductal carcinoma in situ of right breast: Secondary | ICD-10-CM | POA: Diagnosis not present

## 2021-02-28 DIAGNOSIS — E785 Hyperlipidemia, unspecified: Secondary | ICD-10-CM | POA: Diagnosis present

## 2021-02-28 DIAGNOSIS — D500 Iron deficiency anemia secondary to blood loss (chronic): Secondary | ICD-10-CM

## 2021-02-28 DIAGNOSIS — C8339 Diffuse large B-cell lymphoma, extranodal and solid organ sites: Secondary | ICD-10-CM | POA: Diagnosis present

## 2021-02-28 DIAGNOSIS — Z888 Allergy status to other drugs, medicaments and biological substances status: Secondary | ICD-10-CM

## 2021-02-28 DIAGNOSIS — I10 Essential (primary) hypertension: Secondary | ICD-10-CM | POA: Diagnosis not present

## 2021-02-28 DIAGNOSIS — Z5111 Encounter for antineoplastic chemotherapy: Principal | ICD-10-CM

## 2021-02-28 DIAGNOSIS — C8510 Unspecified B-cell lymphoma, unspecified site: Secondary | ICD-10-CM

## 2021-02-28 DIAGNOSIS — E039 Hypothyroidism, unspecified: Secondary | ICD-10-CM | POA: Diagnosis present

## 2021-02-28 DIAGNOSIS — Z881 Allergy status to other antibiotic agents status: Secondary | ICD-10-CM

## 2021-02-28 DIAGNOSIS — F32A Depression, unspecified: Secondary | ICD-10-CM | POA: Diagnosis present

## 2021-02-28 LAB — CBC WITH DIFFERENTIAL/PLATELET
Abs Immature Granulocytes: 2.69 10*3/uL — ABNORMAL HIGH (ref 0.00–0.07)
Basophils Absolute: 0.4 10*3/uL — ABNORMAL HIGH (ref 0.0–0.1)
Basophils Relative: 2 %
Eosinophils Absolute: 0 10*3/uL (ref 0.0–0.5)
Eosinophils Relative: 0 %
HCT: 30.2 % — ABNORMAL LOW (ref 36.0–46.0)
Hemoglobin: 9.4 g/dL — ABNORMAL LOW (ref 12.0–15.0)
Immature Granulocytes: 20 %
Lymphocytes Relative: 9 %
Lymphs Abs: 1.7 10*3/uL (ref 0.7–4.0)
MCH: 28.8 pg (ref 26.0–34.0)
MCHC: 31.1 g/dL (ref 30.0–36.0)
MCV: 92.6 fL (ref 80.0–100.0)
Metamyelocytes Relative: 4 %
Monocytes Absolute: 2.3 10*3/uL — ABNORMAL HIGH (ref 0.1–1.0)
Monocytes Relative: 12 %
Myelocytes: 5 %
Neutro Abs: 12.1 10*3/uL — ABNORMAL HIGH (ref 1.7–7.7)
Neutrophils Relative %: 63 %
Platelets: 395 10*3/uL (ref 150–400)
Promyelocytes Relative: 5 %
RBC: 3.26 MIL/uL — ABNORMAL LOW (ref 3.87–5.11)
RDW: 20.3 % — ABNORMAL HIGH (ref 11.5–15.5)
WBC: 19.2 10*3/uL — ABNORMAL HIGH (ref 4.0–10.5)
nRBC: 0.2 % (ref 0.0–0.2)

## 2021-02-28 LAB — COMPREHENSIVE METABOLIC PANEL
ALT: 23 U/L (ref 0–44)
AST: 21 U/L (ref 15–41)
Albumin: 3 g/dL — ABNORMAL LOW (ref 3.5–5.0)
Alkaline Phosphatase: 119 U/L (ref 38–126)
Anion gap: 9 (ref 5–15)
BUN: 12 mg/dL (ref 8–23)
CO2: 26 mmol/L (ref 22–32)
Calcium: 8.4 mg/dL — ABNORMAL LOW (ref 8.9–10.3)
Chloride: 106 mmol/L (ref 98–111)
Creatinine, Ser: 0.65 mg/dL (ref 0.44–1.00)
GFR, Estimated: >60 mL/min (ref >60)
Glucose, Bld: 122 mg/dL — ABNORMAL HIGH (ref 70–99)
Potassium: 3.7 mmol/L (ref 3.5–5.1)
Sodium: 141 mmol/L (ref 135–145)
Total Bilirubin: 0.8 mg/dL (ref 0.3–1.2)
Total Protein: 5.3 g/dL — ABNORMAL LOW (ref 6.5–8.1)

## 2021-02-28 MED ORDER — ALUM & MAG HYDROXIDE-SIMETH 200-200-20 MG/5ML PO SUSP
30.0000 mL | ORAL | Status: DC | PRN
Start: 2021-02-28 — End: 2021-03-04

## 2021-02-28 MED ORDER — ATORVASTATIN CALCIUM 10 MG PO TABS
10.0000 mg | ORAL_TABLET | Freq: Every day | ORAL | Status: DC
  Administered 2021-02-28 – 2021-03-03 (×4): 10 mg via ORAL
  Filled 2021-02-28 (×4): qty 1

## 2021-02-28 MED ORDER — VINCRISTINE SULFATE CHEMO INJECTION 1 MG/ML
Freq: Once | INTRAVENOUS | Status: AC
  Filled 2021-02-28: qty 8

## 2021-02-28 MED ORDER — ENSURE ENLIVE PO LIQD
237.0000 mL | Freq: Two times a day (BID) | ORAL | Status: DC
  Administered 2021-02-28 – 2021-03-04 (×8): 237 mL via ORAL

## 2021-02-28 MED ORDER — CHLORHEXIDINE GLUCONATE CLOTH 2 % EX PADS
6.0000 | MEDICATED_PAD | Freq: Every day | CUTANEOUS | Status: DC
  Administered 2021-02-28 – 2021-03-03 (×4): 6 via TOPICAL

## 2021-02-28 MED ORDER — CALCIUM CARBONATE 1250 (500 CA) MG PO TABS
1250.0000 mg | ORAL_TABLET | Freq: Every day | ORAL | Status: DC
  Administered 2021-03-01 – 2021-03-03 (×3): 1250 mg via ORAL
  Filled 2021-02-28 (×5): qty 1

## 2021-02-28 MED ORDER — EZETIMIBE 10 MG PO TABS
10.0000 mg | ORAL_TABLET | Freq: Every day | ORAL | Status: DC
  Administered 2021-02-28 – 2021-03-03 (×4): 10 mg via ORAL
  Filled 2021-02-28 (×4): qty 1

## 2021-02-28 MED ORDER — ENOXAPARIN SODIUM 40 MG/0.4ML IJ SOSY
40.0000 mg | PREFILLED_SYRINGE | INTRAMUSCULAR | Status: DC
  Administered 2021-02-28 – 2021-03-04 (×5): 40 mg via SUBCUTANEOUS
  Filled 2021-02-28 (×5): qty 0.4

## 2021-02-28 MED ORDER — SODIUM CHLORIDE 0.9% FLUSH
10.0000 mL | Freq: Two times a day (BID) | INTRAVENOUS | Status: DC
  Administered 2021-02-28 – 2021-03-03 (×5): 10 mL

## 2021-02-28 MED ORDER — SODIUM CHLORIDE 0.9 % IV SOLN
INTRAVENOUS | Status: DC
  Administered 2021-03-02: 20 mL via INTRAVENOUS

## 2021-02-28 MED ORDER — LEVOTHYROXINE SODIUM 100 MCG PO TABS
100.0000 ug | ORAL_TABLET | ORAL | Status: DC
  Administered 2021-03-01 – 2021-03-03 (×2): 100 ug via ORAL
  Filled 2021-02-28 (×3): qty 1

## 2021-02-28 MED ORDER — MAGNESIUM OXIDE -MG SUPPLEMENT 400 (240 MG) MG PO TABS
400.0000 mg | ORAL_TABLET | Freq: Two times a day (BID) | ORAL | Status: DC
  Administered 2021-02-28: 400 mg via ORAL
  Filled 2021-02-28: qty 1

## 2021-02-28 MED ORDER — CYANOCOBALAMIN 1000 MCG/ML IJ SOLN
1000.0000 ug | Freq: Once | INTRAMUSCULAR | Status: AC
Start: 2021-02-28 — End: 2021-02-28
  Administered 2021-02-28: 1000 ug via SUBCUTANEOUS
  Filled 2021-02-28 (×2): qty 1

## 2021-02-28 MED ORDER — VENLAFAXINE HCL ER 37.5 MG PO CP24
37.5000 mg | ORAL_CAPSULE | Freq: Every day | ORAL | Status: DC
  Administered 2021-03-01 – 2021-03-04 (×4): 37.5 mg via ORAL
  Filled 2021-02-28 (×4): qty 1

## 2021-02-28 MED ORDER — LORAZEPAM 0.5 MG PO TABS
0.5000 mg | ORAL_TABLET | Freq: Every evening | ORAL | Status: DC | PRN
  Administered 2021-02-28 – 2021-03-03 (×4): 0.5 mg via ORAL
  Filled 2021-02-28 (×4): qty 1

## 2021-02-28 MED ORDER — SODIUM BICARBONATE/SODIUM CHLORIDE MOUTHWASH
Freq: Four times a day (QID) | OROMUCOSAL | Status: DC
  Administered 2021-03-02 – 2021-03-03 (×2): 1 via OROMUCOSAL
  Filled 2021-02-28: qty 1000

## 2021-02-28 MED ORDER — PANTOPRAZOLE SODIUM 40 MG PO TBEC
40.0000 mg | DELAYED_RELEASE_TABLET | Freq: Every day | ORAL | Status: DC
Start: 2021-03-01 — End: 2021-03-04
  Administered 2021-03-01 – 2021-03-04 (×4): 40 mg via ORAL
  Filled 2021-02-28 (×4): qty 1

## 2021-02-28 MED ORDER — PREDNISONE 20 MG PO TABS
60.0000 mg | ORAL_TABLET | Freq: Every day | ORAL | Status: AC
  Administered 2021-02-28 – 2021-03-04 (×5): 60 mg via ORAL
  Filled 2021-02-28 (×5): qty 3

## 2021-02-28 MED ORDER — MAGIC MOUTHWASH W/LIDOCAINE
5.0000 mL | Freq: Four times a day (QID) | ORAL | Status: DC | PRN
  Filled 2021-02-28: qty 5

## 2021-02-28 MED ORDER — ADULT MULTIVITAMIN W/MINERALS CH
1.0000 | ORAL_TABLET | Freq: Every day | ORAL | Status: DC
  Administered 2021-03-01 – 2021-03-04 (×4): 1 via ORAL
  Filled 2021-02-28 (×4): qty 1

## 2021-02-28 MED ORDER — PROCHLORPERAZINE MALEATE 10 MG PO TABS: 10.0000 mg | ORAL_TABLET | Freq: Four times a day (QID) | ORAL | Status: DC | PRN

## 2021-02-28 MED ORDER — MIRTAZAPINE 15 MG PO TABS
15.0000 mg | ORAL_TABLET | Freq: Every day | ORAL | Status: DC
  Administered 2021-02-28 – 2021-03-03 (×4): 15 mg via ORAL
  Filled 2021-02-28 (×4): qty 1

## 2021-02-28 MED ORDER — B COMPLEX-C PO TABS
1.0000 | ORAL_TABLET | Freq: Every day | ORAL | Status: DC
Start: 2021-03-01 — End: 2021-03-04
  Administered 2021-03-01 – 2021-03-04 (×4): 1 via ORAL
  Filled 2021-02-28 (×4): qty 1

## 2021-02-28 MED ORDER — LEVOTHYROXINE SODIUM 88 MCG PO TABS
88.0000 ug | ORAL_TABLET | ORAL | Status: DC
  Administered 2021-03-02 – 2021-03-04 (×2): 88 ug via ORAL
  Filled 2021-02-28 (×3): qty 1

## 2021-02-28 MED ORDER — HYDROCODONE-ACETAMINOPHEN 5-325 MG PO TABS: 1.0000 | ORAL_TABLET | Freq: Four times a day (QID) | ORAL | Status: DC | PRN

## 2021-02-28 MED ORDER — MONTELUKAST SODIUM 10 MG PO TABS
10.0000 mg | ORAL_TABLET | Freq: Every day | ORAL | Status: DC
  Administered 2021-03-01 – 2021-03-03 (×3): 10 mg via ORAL
  Filled 2021-02-28 (×3): qty 1

## 2021-02-28 MED ORDER — SODIUM CHLORIDE 0.9 % IV SOLN
Freq: Once | INTRAVENOUS | Status: AC
  Administered 2021-02-28: 8 mg via INTRAVENOUS
  Filled 2021-02-28: qty 4

## 2021-02-28 MED ORDER — ALBUTEROL SULFATE (2.5 MG/3ML) 0.083% IN NEBU: 2.5000 mg | INHALATION_SOLUTION | Freq: Four times a day (QID) | RESPIRATORY_TRACT | Status: DC | PRN

## 2021-02-28 MED ORDER — CALCIUM CARBONATE 1250 (500 CA) MG PO TABS: 1250.0000 mg | ORAL_TABLET | Freq: Every day | ORAL | Status: DC

## 2021-02-28 MED ORDER — SENNOSIDES-DOCUSATE SODIUM 8.6-50 MG PO TABS
1.0000 | ORAL_TABLET | Freq: Two times a day (BID) | ORAL | Status: DC
  Administered 2021-02-28 – 2021-03-04 (×9): 1 via ORAL
  Filled 2021-02-28 (×9): qty 1

## 2021-02-28 MED ORDER — FLUTICASONE PROPIONATE 50 MCG/ACT NA SUSP
2.0000 | Freq: Every day | NASAL | Status: DC
Start: 2021-02-28 — End: 2021-03-04
  Administered 2021-03-01 – 2021-03-04 (×4): 2 via NASAL
  Filled 2021-02-28: qty 16

## 2021-02-28 MED ORDER — POLYETHYLENE GLYCOL 3350 17 G PO PACK
17.0000 g | PACK | Freq: Every day | ORAL | Status: DC
  Administered 2021-03-01 – 2021-03-04 (×3): 17 g via ORAL
  Filled 2021-02-28 (×4): qty 1

## 2021-02-28 MED ORDER — VITAMIN D (ERGOCALCIFEROL) 1.25 MG (50000 UNIT) PO CAPS
50000.0000 [IU] | ORAL_CAPSULE | ORAL | Status: DC
  Administered 2021-03-04: 50000 [IU] via ORAL
  Filled 2021-02-28: qty 1

## 2021-02-28 MED ORDER — SODIUM CHLORIDE 0.9% FLUSH: 10.0000 mL | INTRAVENOUS | Status: DC | PRN

## 2021-02-28 NOTE — H&P (Signed)
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  The patient has been being followed by Dr. Lindi Adie in our office for her breast cancer.  The patient had a CT of the abdomen/pelvis with contrast due to abdominal pain performed on 10/21/2020 which showed acute colitis/enteritis of the cecum/ascending colon and the associated terminal loops of ileum, acute inflammatory changes contribute to at least a partial small bowel obstruction, primary concern of this inflammatory cystic mass/bowel of the right lower quadrant is malignancy given that the posterior margin is inseparable from the psoas muscle, adnexa, and right ureter.  A colonoscopy was performed on 11/04/2020 which showed an infiltrative and ulcerated partially obstructing large mass was found at 85 cm proximal to the anus. The mass was circumferential.  This mass was biopsied and was consistent with large B-cell lymphoma.  Due to this new finding, the patient was referred to Dr. Irene Limbo for consideration of systemic chemotherapy for treatment of her B-cell lymphoma.  The patient is seen today prior to cycle #6 of her chemotherapy.  The patient reports that she is feeling well today overall.  She still reports a slightly decreased appetite.  She is not having any abdominal pain, abdominal distention, nausea, vomiting.  She is not having any fevers,  chills, dizziness.  She is having regular bowel movements with occasional loose stools.  She currently denies mucositis, chest pain, shortness of breath, cough, bleeding.  Reports slight swelling in her feet which has improved. The patient is seen today for admission prior to cycle #6 of EPOCH-R.      Past Medical History:  Diagnosis Date   Allergy    Anemia    Anxiety    Arthralgia 09/11/2011   Asthma    cough variant asthma   Breast cancer ([place]) 2011   RIGHT BREAST CA   Breast cancer, left ([place]) 09/2020   left breast IDC   Cataract    DCIS (ductal carcinoma in situ) of breast 2011   DCIS   Depression    GERD (gastroesophageal reflux disease)    Hyperlipidemia    Hypertension    Hypothyroidism    Insomnia    Thyroid disease    hypothyroidism                                            CATARACT EXTRACTION, BILATERAL     COLONOSCOPY  2015   COLONOSCOPY WITH PROPOFOL N/A 04/14/2014   Procedure: COLONOSCOPY WITH PROPOFOL;  Surgeon: Garlan Fair, MD;  Location: [facility];  Service: Endoscopy;  Laterality: N/A;   IR IMAGING GUIDED PORT INSERTION  12/01/2020   POLYPECTOMY     ROTATOR CUFF REPAIR Left 04/2013   Dr. Para March         Allergies  Allergen Reactions   Advair Diskus [Fluticasone-Salmeterol] Other (See Comments)    Hoarseness.   Neosporin [Neomycin-Polymyxin-Gramicidin]  Itching                               Maternal Aunt        lung   Cancer Paternal Uncle        colon, stomach   Colon cancer Neg Hx    Colon polyps Neg Hx    Esophageal cancer Neg Hx    Rectal cancer Neg Hx    Stomach cancer Neg Hx      Socioeconomic History   Marital status: Married    Spouse name: Not on file   Number of children: Not on file   Years of education: Not on file   Highest education level: Not on file  Occupational History   Not on file  Tobacco Use   Smoking status: Former Smoker    Packs/day: 1.00    Years: 15.00    Pack years: 15.00    Types:  Cigarettes    Quit date: 12/09/1978    Years since quitting: 42.0   Smokeless tobacco: Never Used  Vaping Use   Vaping Use: Never used  Substance and Sexual Activity   Alcohol use: Yes    Alcohol/week: 7.0 standard drinks    Types: 7 Glasses of wine per week    Comment: SOCIALLY wine   Drug use: No   Sexual activity: Yes    Birth control/protection: Surgical  Other Topics Concern   Not on file  Social History Narrative   Not on file   Social Determinants of Health   Financial Resource Strain: Not on file  Food Insecurity: Not on file  Transportation Needs: Not on file  Physical Activity: Not on file  Stress: Not on file  Social Connections: Not on file  Intimate Partner Violence: Not At Risk   Fear of Current or Ex-Partner: No   Emotionally Abused: No   Physically Abused: No   Sexually Abused: No  :  Review of Systems: A comprehensive 14 point review of systems was negative except as noted in the HPI.  Exam: BP 120/69 (BP Location: Left Arm)   Pulse 90   Temp 97.8 F (36.6 C) (Oral)   Resp 16   SpO2 100%   General:  well-nourished in no acute distress.   Eyes:  no scleral icterus.   ENT:  There were no oropharyngeal lesions.    Lymphatics:  Negative cervical, supraclavicular or axillary adenopathy.   Respiratory: lungs were clear bilaterally without wheezing or crackles.   Cardiovascular:  Regular rate and rhythm, S1/S2, without murmur, rub or gallop.  There was no pedal edema.   GI:  abdomen was soft, flat, nontender, nondistended, without organomegaly.   Musculoskeletal:  no spinal tenderness of palpation of vertebral spine.   Skin exam was without echymosis, petichae.   Neuro exam was nonfocal. Patient was alert and oriented.  Attention was good.   Language was appropriate.  Mood was normal without depression.  Speech was not pressured.  Thought content was not tangential.     Lab Results  Component Value Date   WBC 11.1 (H) 12/24/2020   HGB 9.7 (L)  12/24/2020   HCT 31.0 (L) 12/24/2020   PLT 246 12/24/2020   GLUCOSE 90 12/24/2020   CHOL 94 (L) 12/19/2019   TRIG 54 12/19/2019   HDL 32 (L) 12/19/2019   LDLCALC 49 12/19/2019   ALT 18 12/24/2020   AST 19 12/24/2020   NA 139  12/24/2020   K 4.1 12/24/2020   CL 107 12/24/2020   CREATININE 0.65 12/24/2020   BUN 16 12/24/2020   CO2 24 12/24/2020    NM PET Image Initial (PI) Skull Base To Thigh  Result Date: 12/18/2020 CLINICAL DATA:  Initial treatment strategy for new diagnosis of large B-cell lymphoma involving the distal small bowel and cecum. Chemotherapy including on 12/06/2020. EXAM: NUCLEAR MEDICINE PET SKULL BASE TO THIGH TECHNIQUE: 6.4 mCi F-18 FDG was injected intravenously. Full-ring PET imaging was performed from the skull base to thigh after the radiotracer. CT data was obtained and used for attenuation correction and anatomic localization. Fasting blood glucose: 103 mg/dl COMPARISON:  10/21/2020 abdominopelvic CT.  CTA chest of 02/12/2006. FINDINGS: Motion degraded CT images. Mediastinal blood pool activity: SUV max 1.4 Liver activity: SUV max 2.3 NECK: No areas of abnormal hypermetabolism. Incidental CT findings: No cervical adenopathy. Presumed sebaceous cyst about the posterior right neck at 8 mm on 12/04. CHEST: No pulmonary parenchymal or thoracic nodal hypermetabolism. Incidental CT findings:Mild aortic and lad coronary artery calcification. Right Port-A-Cath tip at mid right atrium. ABDOMEN/PELVIS: The right lower quadrant/ileocecal mass is less distinct today, given motion degradation in this area. Corresponds to hypermetabolism at a S.U.V. max of 6.7 including on 151/4. No abdominopelvic nodal hypermetabolism. The spleen is normal in size, mildly hypermetabolic relative to the liver. Incidental CT findings: Aortic atherosclerosis. Hysterectomy. Pelvic floor laxity. Large colonic stool burden. Mild hepatic steatosis. SKELETON: Diffuse moderate marrow hypermetabolism, with an  example area in the L4 vertebral body measuring a S.U.V. max of 9.0. Incidental CT findings: none IMPRESSION: 1. Hypermetabolism corresponding to the right lower quadrant, ileocolic mass consistent with the clinical history of bowel lymphoma. 2. Diffuse marrow hypermetabolism which could represent marrow stimulation by chemotherapy or marrow involvement by lymphoma. 3. Mild hypermetabolism of the spleen relative to the liver, nonspecific. No splenomegaly. 4. Incidental findings, including: Coronary artery atherosclerosis. Aortic Atherosclerosis (ICD10-I70.0). Hepatic steatosis. Possible constipation. 5. Motion degraded CT images. The right lower lobe pulmonary nodule described on prior CTs is not readily apparent but likely below PET resolution. Electronically Signed   By: Abigail Miyamoto M.D.   On: 12/18/2020 18:07   IR IMAGING GUIDED PORT INSERTION  Result Date: 12/01/2020 CLINICAL DATA:  Left breast carcinoma and diffuse large B-cell lymphoma. The patient requires a porta cath to begin chemotherapy. EXAM: IMPLANTED PORT A CATH PLACEMENT WITH ULTRASOUND AND FLUOROSCOPIC GUIDANCE ANESTHESIA/SEDATION: 2.0 mg IV Versed; 100 mcg IV Fentanyl Total Moderate Sedation Time:  34 minutes The patient's level of consciousness and physiologic status were continuously monitored during the procedure by Radiology nursing. FLUOROSCOPY TIME:  24 seconds.  3.0 mGy. PROCEDURE: The procedure, risks, benefits, and alternatives were explained to the patient. Questions regarding the procedure were encouraged and answered. The patient understands and consents to the procedure. A time-out was performed prior to initiating the procedure. Ultrasound was utilized to confirm patency of the right internal jugular vein. The right neck and chest were prepped with chlorhexidine in a sterile fashion, and a sterile drape was applied covering the operative field. Maximum barrier sterile technique with sterile gowns and gloves were used for the  procedure. Local anesthesia was provided with 1% lidocaine. After creating a small venotomy incision, a 21 gauge needle was advanced into the right internal jugular vein under direct, real-time ultrasound guidance. Ultrasound image documentation was performed. After securing guidewire access, an 8 Fr dilator was placed. A J-wire was kinked to measure appropriate catheter length. A subcutaneous  port pocket was then created along the upper chest wall utilizing sharp and blunt dissection. Portable cautery was utilized. The pocket was irrigated with sterile saline. A single lumen power injectable port was chosen for placement. The 8 Fr catheter was tunneled from the port pocket site to the venotomy incision. The port was placed in the pocket. External catheter was trimmed to appropriate length based on guidewire measurement. At the venotomy, an 8 Fr peel-away sheath was placed over a guidewire. The catheter was then placed through the sheath and the sheath removed. Final catheter positioning was confirmed and documented with a fluoroscopic spot image. The port was accessed with a needle and aspirated and flushed with heparinized saline. The access needle was removed. The venotomy and port pocket incisions were closed with subcutaneous 3-0 Monocryl and subcuticular 4-0 Vicryl. Dermabond was applied to both incisions. COMPLICATIONS: COMPLICATIONS None FINDINGS: After catheter placement, the tip lies at the cavo-atrial junction. The catheter aspirates normally and is ready for immediate use. IMPRESSION: Placement of single lumen port a cath via right internal jugular vein. The catheter tip lies at the cavo-atrial junction. A power injectable port a cath was placed and is ready for immediate use. Electronically Signed   By: Aletta Edouard M.D.   On: 12/01/2020 11:20     NM PET Image Initial (PI) Skull Base To Thigh  Result Date: 12/18/2020 CLINICAL DATA:  Initial treatment strategy for new diagnosis of large B-cell  lymphoma involving the distal small bowel and cecum. Chemotherapy including on 12/06/2020. EXAM: NUCLEAR MEDICINE PET SKULL BASE TO THIGH TECHNIQUE: 6.4 mCi F-18 FDG was injected intravenously. Full-ring PET imaging was performed from the skull base to thigh after the radiotracer. CT data was obtained and used for attenuation correction and anatomic localization. Fasting blood glucose: 103 mg/dl COMPARISON:  10/21/2020 abdominopelvic CT.  CTA chest of 02/12/2006. FINDINGS: Motion degraded CT images. Mediastinal blood pool activity: SUV max 1.4 Liver activity: SUV max 2.3 NECK: No areas of abnormal hypermetabolism. Incidental CT findings: No cervical adenopathy. Presumed sebaceous cyst about the posterior right neck at 8 mm on 12/04. CHEST: No pulmonary parenchymal or thoracic nodal hypermetabolism. Incidental CT findings:Mild aortic and lad coronary artery calcification. Right Port-A-Cath tip at mid right atrium. ABDOMEN/PELVIS: The right lower quadrant/ileocecal mass is less distinct today, given motion degradation in this area. Corresponds to hypermetabolism at a S.U.V. max of 6.7 including on 151/4. No abdominopelvic nodal hypermetabolism. The spleen is normal in size, mildly hypermetabolic relative to the liver. Incidental CT findings: Aortic atherosclerosis. Hysterectomy. Pelvic floor laxity. Large colonic stool burden. Mild hepatic steatosis. SKELETON: Diffuse moderate marrow hypermetabolism, with an example area in the L4 vertebral body measuring a S.U.V. max of 9.0. Incidental CT findings: none IMPRESSION: 1. Hypermetabolism corresponding to the right lower quadrant, ileocolic mass consistent with the clinical history of bowel lymphoma. 2. Diffuse marrow hypermetabolism which could represent marrow stimulation by chemotherapy or marrow involvement by lymphoma. 3. Mild hypermetabolism of the spleen relative to the liver, nonspecific. No splenomegaly. 4. Incidental findings, including: Coronary artery  atherosclerosis. Aortic Atherosclerosis (ICD10-I70.0). Hepatic steatosis. Possible constipation. 5. Motion degraded CT images. The right lower lobe pulmonary nodule described on prior CTs is not readily apparent but likely below PET resolution. Electronically Signed   By: Abigail Miyamoto M.D.   On: 12/18/2020 18:07   IR IMAGING GUIDED PORT INSERTION  Result Date: 12/01/2020 CLINICAL DATA:  Left breast carcinoma and diffuse large B-cell lymphoma. The patient requires a porta cath to begin  chemotherapy. EXAM: IMPLANTED PORT A CATH PLACEMENT WITH ULTRASOUND AND FLUOROSCOPIC GUIDANCE ANESTHESIA/SEDATION: 2.0 mg IV Versed; 100 mcg IV Fentanyl Total Moderate Sedation Time:  34 minutes The patient's level of consciousness and physiologic status were continuously monitored during the procedure by Radiology nursing. FLUOROSCOPY TIME:  24 seconds.  3.0 mGy. PROCEDURE: The procedure, risks, benefits, and alternatives were explained to the patient. Questions regarding the procedure were encouraged and answered. The patient understands and consents to the procedure. A time-out was performed prior to initiating the procedure. Ultrasound was utilized to confirm patency of the right internal jugular vein. The right neck and chest were prepped with chlorhexidine in a sterile fashion, and a sterile drape was applied covering the operative field. Maximum barrier sterile technique with sterile gowns and gloves were used for the procedure. Local anesthesia was provided with 1% lidocaine. After creating a small venotomy incision, a 21 gauge needle was advanced into the right internal jugular vein under direct, real-time ultrasound guidance. Ultrasound image documentation was performed. After securing guidewire access, an 8 Fr dilator was placed. A J-wire was kinked to measure appropriate catheter length. A subcutaneous port pocket was then created along the upper chest wall utilizing sharp and blunt dissection. Portable cautery was  utilized. The pocket was irrigated with sterile saline. A single lumen power injectable port was chosen for placement. The 8 Fr catheter was tunneled from the port pocket site to the venotomy incision. The port was placed in the pocket. External catheter was trimmed to appropriate length based on guidewire measurement. At the venotomy, an 8 Fr peel-away sheath was placed over a guidewire. The catheter was then placed through the sheath and the sheath removed. Final catheter positioning was confirmed and documented with a fluoroscopic spot image. The port was accessed with a needle and aspirated and flushed with heparinized saline. The access needle was removed. The venotomy and port pocket incisions were closed with subcutaneous 3-0 Monocryl and subcuticular 4-0 Vicryl. Dermabond was applied to both incisions. COMPLICATIONS: COMPLICATIONS None FINDINGS: After catheter placement, the tip lies at the cavo-atrial junction. The catheter aspirates normally and is ready for immediate use. IMPRESSION: Placement of single lumen port a cath via right internal jugular vein. The catheter tip lies at the cavo-atrial junction. A power injectable port a cath was placed and is ready for immediate use. Electronically Signed   By: Aletta Edouard M.D.   On: 12/01/2020 11:20    Pathology:  Diagnosis Colon, biopsy - INVOLVEMENT BY LARGE B-CELL LYMPHOMA, SEE COMMENT. Microscopic Comment Fragments of colonic mucosa reveal effacement by a dense lymphoid infiltrate. There is crush artifact, but intact areas reveal sheets of large lymphoid cells with irregular nuclear contours and prominent nucleoli. The cells are positive for CD45, CD20, bcl-6, bcl-2, CD21, and MUM-1. They are negative for CD10, CD30, and EBV in situ hybridization. Ki-67 is elevated. CD3 and CD5 highlight scattered T-cells. The overall findings are consistent with involvement by a large B-cell lymphoma. The differential includes a diffuse large B-cell  lymphoma, activated B-cell type or an aggressive B-cell lymphoma (double hit lymphoma). FISH for MYC, BCL-2, and BCL-6 will be attempted and reported in an addendum. Dr. Silverio Decamp was notified on 11/10/2020. Vicente Males MD Pathologist, Electronic Signature (Case signed 11/10/2020)  Assessment and Plan:   1) Large B-cell lymphoma -CT abdomen/pelvis with contrast 10/21/2020 - "Acute colitis/enteritis of the cecum/ascending colon and the associated terminal loops of ileum. As was recently described on CT imaging, the anatomy of distal ileum, ileocecal  valve, cecum is atypical and correlation with patient's prior surgical record would be useful. The acute inflammatory changes contribute to at least a partial small bowel obstruction. Primary concern of this inflammatory cystic mass/bowel of the right lower quadrant is malignancy given that the posterior margin is inseparable from the psoas muscle, adnexa, and the right ureter. Correlation with CEA may be useful. Inflammatory/infectious changes are also a consideration." -Colonoscopy performed 11/04/2020- "An infiltrative and ulcerated partially obstructing large mass was found at 85 cm proximal to the anus. 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 --Patient initiated treatment with EPOCH on 11/15/2020. --PET scan from 12/17/2020 - "hypermetabolism corresponding to the right lower quadrant, ileocolic mass consistent with the clinical history of bowel lymphoma. Diffuse marrow hypermetabolism which could represent marrow stimulation by chemotherapy or marrow involvement by lymphoma. Mild hypermetabolism of the spleen relative to the liver,nonspecific. No splenomegaly.Incidental findings, including: Coronary artery atherosclerosis.Aortic Atherosclerosis (ICD10-I70.0). Hepatic steatosis. Possible constipation. Motion degraded CT images. The right lower lobe pulmonary nodule described on prior CTs is not readily  apparent but likely below PET Resolution. --CT abdomen/pelvis with contrast performed 02/11/2021- "1. Fairly high-grade small-bowel obstruction as detailed above. This appears to be due to a very irregular masslike area of scarring change or treated tumor in the right lower quadrant with surrounding fibrosis and adhesions. 2. No residual bowel wall thickening to suggest persistent lymphoma involving the cecum or terminal ileum. 3. Small amount of free abdominal/free pelvic fluid likely related to the small-bowel obstruction. 4. 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  She has leukocytosis secondary to recent G-CSF, stable hemoglobin, and normal platelets.  Counts are adequate to proceed with chemotherapy as planned today. -Continue as needed antiemetics. -MiraLAX and Senokot ordered for constipation. -Lovenox for DVT prophylaxis. -Continue home medications. -She is due for her monthly B12 injection and will administer today. -We will arrange for outpatient Rituxan and Neulasta as well as follow-up visits.  Mikey Bussing, DNP, AGPCNP-BC, AOCNP

## 2021-02-28 NOTE — Progress Notes (Signed)
[place] [phone]   [phone]  PROGRESS NOTE  Patient Care Team: Hamrick, Lorin Mercy, MD as PCP - General (Family Medicine) Juanito Doom, MD as Consulting Physician (Pulmonary Disease) Elsie Saas, MD as Consulting Physician (Orthopedic Surgery) Allyn Kenner, MD as Consulting Physician (Dermatology) Katy Apo, MD as Consulting Physician (Ophthalmology) Jovita Kussmaul, MD as Consulting Physician (General Surgery) Nicholas Lose, MD as Consulting Physician (Hematology and Oncology) Garlan Fair, MD as Consulting Physician (Gastroenterology) Gardenia Phlegm, NP as Nurse Practitioner (Hematology and Oncology)  Hematological/Oncological History Oncology History  Cancer of right breast, stage 0  09/13/2009 Initial Biopsy   Right breast core needle biopsy: 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 Biopsy showed invasive and in situ ductal carcinoma, HER-2 equivocal by IHC (2+), negative by FISH (ratio 1.59), ER+ >95%, PR+ 85%, Ki67 10%.    Malignant neoplasm of left breast in female, estrogen receptor positive ([place])  10/12/2020 Initial Diagnosis   Malignant neoplasm of left breast in female, estrogen receptor positive ([place])    10/12/2020 Cancer Staging   Staging form: Breast, AJCC 8th Edition - Clinical stage from 10/12/2020: Stage IA (cT1b, cN0, cM0, G2, ER+, PR+, HER2-) - Signed by Nicholas Lose, MD on 10/15/2020  Stage prefix: Initial diagnosis    11/05/2020 Surgery   Left lumpectomy: Grade 1 IDC, 1.1 cm, intermediate grade DCIS, margins are negative, lymph node -0/1, ER greater than 95%, PR 85%, HER-2  negative, Ki-67 10%   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 female who presents to the clinic for a follow up evaluation and management of large B-cell lymphoma. Patient was last seen in clinic on 02/18/2021. Since then, patient was admitted for from 02/18/2021-02/22/2021 for neutropenic fever.   On exam today, Lindsey Price reports that since yesterday, her energy levels and appetite have improved.  She denies any nausea, vomiting or abdominal pain. She had 3-4 episodes of diarrhea yesterday that resolved with taking a dose of imodium. Patient denies any hematochezia or melena. 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 Patient denies any fevers, chills, shortness of breath, chest pain, cough, edema or skin changes. She has no other complaints. Rest of the 10 point ROS is below.   MEDICAL HISTORY:  Past Medical History:  Diagnosis Date   Allergy    Anemia    Anxiety    Arthralgia 09/11/2011   Asthma    cough variant asthma   Breast cancer ([place]) 2011   RIGHT BREAST CA   Breast cancer, left ([place]) 09/2020   left  breast IDC   Cataract    DCIS (ductal carcinoma in situ) of breast 2011   DCIS   Depression    GERD (gastroesophageal reflux disease)    Hyperlipidemia    Hypertension    Hypothyroidism    Insomnia    Thyroid disease    hypothyroidism    SURGICAL HISTORY: Past Surgical History:  Procedure Laterality Date   ABDOMINAL  HYSTERECTOMY  1975   partial   BREAST BIOPSY Right 09/10/2013   BENIGN   BREAST BIOPSY Left 09/07/2011   BENIGN   BREAST LUMPECTOMY Right 2011   BREAST LUMPECTOMY WITH RADIOACTIVE SEED AND SENTINEL LYMPH NODE BIOPSY Left 11/05/2020   Procedure: LEFT BREAST LUMPECTOMY WITH RADIOACTIVE SEED AND SENTINEL LYMPH NODE BIOPSY;  Surgeon: Jovita Kussmaul, MD;  Location: [facility];  Service: General;  Laterality: Left;   CATARACT EXTRACTION, BILATERAL     COLONOSCOPY  2015   COLONOSCOPY WITH PROPOFOL N/A 04/14/2014   Procedure: COLONOSCOPY WITH PROPOFOL;  Surgeon: Garlan Fair, MD;  Location: [facility];  Service: Endoscopy;  Laterality: N/A;   IR IMAGING GUIDED PORT INSERTION  12/01/2020   POLYPECTOMY     ROTATOR CUFF REPAIR Left 04/2013   Dr. Para March    SOCIAL HISTORY: Social History   Socioeconomic History   Marital status: Married    Spouse name: Not on file   Number of children: Not on file   Years of education: Not on file   Highest education level: Not on file  Occupational History   Not on file  Tobacco Use   Smoking status: Former    Packs/day: 1.00    Years: 15.00    Pack years: 15.00    Types: Cigarettes    Quit date: 12/09/1978    Years since quitting: 42.2   Smokeless tobacco: Never  Vaping Use   Vaping Use: Never used  Substance and Sexual Activity   Alcohol use: Yes    Alcohol/week: 7.0 standard drinks    Types: 7 Glasses of wine per week    Comment: SOCIALLY wine   Drug use: No   Sexual activity: Yes    Birth control/protection: Surgical  Other Topics Concern   Not on file  Social History Narrative   Not on file   Social Determinants of Health   Financial Resource Strain: Not on file  Food Insecurity: Not on file  Transportation Needs: Not on file  Physical Activity: Not on file  Stress: Not on file  Social Connections: Not on file  Intimate Partner Violence: Not At Risk   Fear of Current or Ex-Partner: No   Emotionally Abused: No    Physically Abused: No   Sexually Abused: No    FAMILY HISTORY: Family History  Problem Relation Age of Onset   Cancer Mother        BREAST   Cancer Father        pancreatic   Cancer Sister        sebacous cell carcinoma   Cancer Maternal Aunt        lung   Cancer Paternal Uncle        colon, stomach   Colon cancer Neg Hx    Colon polyps Neg Hx    Esophageal cancer Neg Hx    Rectal cancer Neg Hx    Stomach cancer Neg Hx     ALLERGIES:  is allergic to advair diskus [fluticasone-salmeterol] and neosporin [neomycin-polymyxin-gramicidin].  MEDICATIONS:  No current facility-administered medications for  this visit.   No current outpatient medications on file.   Facility-Administered Medications Ordered in Other Visits  Medication Dose Route Frequency Provider Last Rate Last Admin   0.9 %  sodium chloride infusion   Intravenous Continuous Ledell Peoples IV, MD 10 mL/hr at 02/28/21 1141 New Bag at 02/28/21 1141   albuterol (PROVENTIL) (2.5 MG/3ML) 0.083% nebulizer solution 2.5 mg  2.5 mg Nebulization Q6H PRN Maryanna Shape, NP       alum & mag hydroxide-simeth (MAALOX/MYLANTA) 200-200-20 MG/5ML suspension 30 mL  30 mL Oral Q4H PRN Maryanna Shape, NP       atorvastatin (LIPITOR) tablet 10 mg  10 mg Oral QHS Maryanna Shape, NP   10 mg at 02/28/21 2113   [START ON 03/01/2021] B-complex with vitamin C tablet 1 tablet  1 tablet Oral Q breakfast Maryanna Shape, NP       [START ON 03/01/2021] calcium carbonate (OS-CAL - dosed in mg of elemental calcium) tablet 1,250 mg  1,250 mg Oral Q lunch Brunetta Genera, MD       Chlorhexidine Gluconate Cloth 2 % PADS 6 each  6 each Topical Daily Maryanna Shape, NP   6 each at 02/28/21 1050   DOXOrubicin (ADRIAMYCIN) 16 mg, etoposide (VEPESID) 82 mg, vinCRIStine (ONCOVIN) 0.7 mg in sodium chloride 0.9 % 1,000 mL chemo infusion   Intravenous Once Orson Slick, MD 51 mL/hr at 02/28/21 1307 New Bag at 02/28/21 1307   enoxaparin  (LOVENOX) injection 40 mg  40 mg Subcutaneous Q24H Mikey Bussing R, NP   40 mg at 02/28/21 1130   ezetimibe (ZETIA) tablet 10 mg  10 mg Oral QHS Maryanna Shape, NP   10 mg at 02/28/21 2113   feeding supplement (ENSURE ENLIVE / ENSURE PLUS) liquid 237 mL  237 mL Oral BID BM Curcio, Kristin R, NP   237 mL at 02/28/21 1311   fluticasone (FLONASE) 50 MCG/ACT nasal spray 2 spray  2 spray Each Nare Daily Curcio, Roselie Awkward, NP       HYDROcodone-acetaminophen (NORCO/VICODIN) 5-325 MG per tablet 1-2 tablet  1-2 tablet Oral Q6H PRN Maryanna Shape, NP       [START ON 03/01/2021] levothyroxine (SYNTHROID) tablet 100 mcg  100 mcg Oral Q48H Curcio, Roselie Awkward, NP       [START ON 03/02/2021] levothyroxine (SYNTHROID) tablet 88 mcg  88 mcg Oral Q48H Curcio, Kristin R, NP       LORazepam (ATIVAN) tablet 0.5 mg  0.5 mg Oral QHS PRN Mikey Bussing R, NP   0.5 mg at 02/28/21 2120   magic mouthwash w/lidocaine  5 mL Oral QID PRN Maryanna Shape, NP       magnesium oxide (MAG-OX) tablet 400 mg  400 mg Oral BID Mikey Bussing R, NP   400 mg at 02/28/21 2113   mirtazapine (REMERON) tablet 15 mg  15 mg Oral QHS Mikey Bussing R, NP   15 mg at 02/28/21 2114   [START ON 03/01/2021] montelukast (SINGULAIR) tablet 10 mg  10 mg Oral Daily Maryanna Shape, NP       [START ON 03/01/2021] multivitamin with minerals tablet 1 tablet  1 tablet Oral Daily Maryanna Shape, NP       [START ON 03/01/2021] pantoprazole (PROTONIX) EC tablet 40 mg  40 mg Oral Daily Curcio, Roselie Awkward, NP       [START ON 03/01/2021] polyethylene glycol (MIRALAX / GLYCOLAX) packet 17 g  17 g Oral Daily Curcio, Roselie Awkward, NP       predniSONE (DELTASONE) tablet 60 mg  60 mg Oral QAC breakfast Orson Slick, MD   60 mg at 02/28/21 1129   prochlorperazine (COMPAZINE) tablet 10 mg  10 mg Oral Q6H PRN Maryanna Shape, NP       senna-docusate (Senokot-S) tablet 1 tablet  1 tablet Oral BID Maryanna Shape, NP   1 tablet at 02/28/21 2114   sodium  bicarbonate/sodium chloride mouthwash 1075ml   Mouth Rinse QID Maryanna Shape, NP   Given at 02/28/21 2114   sodium chloride flush (NS) 0.9 % injection 10-40 mL  10-40 mL Intracatheter Q12H Maryanna Shape, NP   10 mL at 02/28/21 1050   sodium chloride flush (NS) 0.9 % injection 10-40 mL  10-40 mL Intracatheter PRN Maryanna Shape, NP       [START ON 03/01/2021] venlafaxine XR (EFFEXOR-XR) 24 hr capsule 37.5 mg  37.5 mg Oral Q breakfast Maryanna Shape, NP       [START ON 03/04/2021] Vitamin D (Ergocalciferol) (DRISDOL) capsule 50,000 Units  50,000 Units Oral Q Fri Maryanna Shape, NP        REVIEW OF SYSTEMS:   Constitutional: ( - ) fevers, ( - )  chills , ( - ) night sweats Eyes: ( - ) blurriness of vision, ( - ) double vision, ( - ) watery eyes Ears, nose, mouth, throat, and face: ( - ) mucositis, ( - ) sore throat Respiratory: ( - ) cough, ( - ) dyspnea, ( - ) wheezes Cardiovascular: ( - ) palpitation, ( - ) chest discomfort, ( - ) lower extremity swelling Gastrointestinal:  ( - ) nausea, ( - ) heartburn, ( + ) diarrhea Skin: ( - ) abnormal skin rashes Lymphatics: ( - ) new lymphadenopathy, ( - ) easy bruising Neurological: ( + ) numbness, ( - ) tingling, ( - ) new weaknesses Behavioral/Psych: ( - ) mood change, ( - ) new changes  All other systems were reviewed with the patient and are negative.  PHYSICAL EXAMINATION: ECOG PERFORMANCE STATUS: 2 - Symptomatic, <50% confined to bed  Vitals:   02/25/21 1520  BP: 131/63  Pulse: (!) 101  Resp: 19  Temp: 98.5 F (36.9 C)  SpO2: 100%    Filed Weights   02/25/21 1520  Weight: 123 lb 12.8 oz (56.2 kg)     GENERAL: thin female in NAD  SKIN: skin color, texture, turgor are normal, no rashes or significant lesions EYES: conjunctiva are pink and non-injected, sclera clear OROPHARYNX: no exudate, no erythema; lips, buccal mucosa, and tongue normal  NECK: supple, non-tender LYMPH:  no palpable lymphadenopathy in the  cervical, axillary or supraclavicular lymph nodes.  LUNGS: clear to auscultation and percussion with normal breathing effort HEART: regular rate & rhythm and no murmurs and no lower extremity edema ABDOMEN: soft, non-tender, non-distended, normal bowel sounds Musculoskeletal: no cyanosis of digits and no clubbing  PSYCH: alert & oriented x 3, fluent speech NEURO: no focal motor/sensory deficits  LABORATORY DATA:  I have reviewed the data as listed CBC Latest Ref Rng & Units 02/28/2021 02/25/2021 02/22/2021  WBC 4.0 - 10.5 K/uL 19.2(H) 27.3(H) 25.8(H)  Hemoglobin 12.0 - 15.0 g/dL 9.4(L) 8.4(L) 9.3(L)  Hematocrit 36.0 - 46.0 % 30.2(L) 25.8(L) 29.4(L)  Platelets 150 - 400 K/uL 395 284 278    CMP Latest Ref Rng & Units 02/28/2021 02/25/2021 02/22/2021  Glucose 70 -  99 mg/dL 122(H) 108(H) 67(L)  BUN 8 - 23 mg/dL 12 10 7(L)  Creatinine 0.44 - 1.00 mg/dL 0.65 0.55 0.58  Sodium 135 - 145 mmol/L 141 140 135  Potassium 3.5 - 5.1 mmol/L 3.7 4.0 3.8  Chloride 98 - 111 mmol/L 106 107 106  CO2 22 - 32 mmol/L $RemoveB'26 23 22  'Jrtbkkgj$ Calcium 8.9 - 10.3 mg/dL 8.4(L) 8.0(L) 7.9(L)  Total Protein 6.5 - 8.1 g/dL 5.3(L) 5.0(L) -  Total Bilirubin 0.3 - 1.2 mg/dL 0.8 0.6 -  Alkaline Phos 38 - 126 U/L 119 152(H) -  AST 15 - 41 U/L 21 28 -  ALT 0 - 44 U/L 23 22 -     RADIOGRAPHIC STUDIES: I have personally reviewed the radiological images as listed and agreed with the findings in the report. DG Chest 2 View  Result Date: 02/18/2021 CLINICAL DATA:  History of asthma and lymphoma EXAM: CHEST - 2 VIEW COMPARISON:  05/07/2020 FINDINGS: Cardiac shadow is within normal limits. Right-sided chest wall port is noted in satisfactory position. Minimal scarring is noted in the bases bilaterally. No focal infiltrate or effusion is seen. Degenerative change of the thoracic spine is noted. IMPRESSION: No acute abnormality noted. Electronically Signed   By: Inez Catalina M.D.   On: 02/18/2021 19:25   CT ABDOMEN PELVIS W  CONTRAST  Result Date: 02/11/2021 CLINICAL DATA:  History of large B-cell lymphoma involving the cecum and distal ileum. EXAM: CT ABDOMEN AND PELVIS WITH CONTRAST TECHNIQUE: Multidetector CT imaging of the abdomen and pelvis was performed using the standard protocol following bolus administration of intravenous contrast. CONTRAST:  24mL OMNIPAQUE IOHEXOL 300 MG/ML  SOLN COMPARISON:  CT scan 10/21/2020 FINDINGS: Lower chest: Very small bilateral pleural effusions with minimal overlying atelectasis. No worrisome pulmonary lesions. Probable radiation changes involving the anterior aspect of the right lung and there are surgical changes involving the right breast. The heart is within normal limits in size. No pericardial effusion. Hepatobiliary: No hepatic lesions are identified. Gallbladder is slightly distended no definite gallstones or findings for acute cholecystitis. No common bile duct dilatation. Pancreas: No mass, inflammation or ductal dilatation. Spleen: Normal size. No focal lesions. Adrenals/Urinary Tract: Adrenal glands and kidneys are unremarkable. The bladder is unremarkable. Stomach/Bowel: The stomach and duodenum are unremarkable. There is a fairly high-grade small-bowel obstruction noted with markedly dilated small bowel loops and air-fluid/air contrast levels throughout. The colon is decompressed. The small-bowel obstruction appears to be at the distal ileum. There is a very irregular masslike area of scarring changes, fibrosis or treated lymphoid tissue in the right lower quadrant. Surrounding adhesions or scarring in this area. The cecum now appears normal. No residual bowel wall thickening. Vascular/Lymphatic: Stable atherosclerotic calcifications involving the aorta and iliac arteries but no aneurysm. No mesenteric or retroperitoneal mass or adenopathy. Reproductive: Surgically absent. Other: Small amount of free abdominal/free pelvic fluid likely related to the patient has small-bowel  obstruction. Musculoskeletal: No significant bony findings. IMPRESSION: 1. Fairly high-grade small-bowel obstruction as detailed above. This appears to be due to a very irregular masslike area of scarring change or treated tumor in the right lower quadrant with surrounding fibrosis and adhesions. 2. No residual bowel wall thickening to suggest persistent lymphoma involving the cecum or terminal ileum. 3. Small amount of free abdominal/free pelvic fluid likely related to the small-bowel obstruction. 4. Very small bilateral pleural effusions with minimal overlying atelectasis. These results will be called to the ordering clinician or representative by the Radiologist Assistant, and communication documented  in the PACS or [employer]. Aortic Atherosclerosis (ICD10-I70.0). Electronically Signed   By: Marijo Sanes M.D.   On: 02/11/2021 19:37   DG Abd 2 Views  Result Date: 02/13/2021 CLINICAL DATA:  Small-bowel obstruction on CT EXAM: ABDOMEN - 2 VIEW COMPARISON:  CT of 2 days ago FINDINGS: Supine and upright views. The upright view demonstrates no free intraperitoneal air. Right hemidiaphragm elevation. Cardiomegaly. No upper abdominal air-fluid levels. The supine view demonstrates normal caliber gas-filled colon. Small bowel loops measure up to 4.0 cm, minimally decreased compared to the prior CT. IMPRESSION: Improvement in small-bowel obstruction pattern. No free intraperitoneal air or other acute complication. Electronically Signed   By: Abigail Miyamoto M.D.   On: 02/13/2021 10:16     ASSESSMENT & PLAN Lanyah Spengler is a 75 y.o. female presenting to the clinic for for a follow up after Cycle 5 of EPOCH.   #Large B-cell Lymphoma: --CT abdomen/pelvis with contrast 10/21/2020 - "Acute colitis/enteritis of the cecum/ascending colon and the associated terminal loops of ileum. As was recently described on CT imaging, the anatomy of distal ileum, ileocecal valve, cecum is atypical and correlation with  patient's prior surgical record would be useful. The acute inflammatory changes contribute to at least a partial small bowel obstruction. Primary concern of this inflammatory cystic mass/bowel of the right lower quadrant is malignancy given that the posterior margin is inseparable from the psoas muscle, adnexa, and the right ureter. --Colonoscopy performed 11/04/2020- "An infiltrative and ulcerated partially obstructing large mass was found at 85 cm proximal to the anus. The mass was circumferential." Biopsy of the colon mass consistent with large B-cell lymphoma --Patient initiated treatment with EPOCH on 11/15/2020. --PET scan from 12/17/2020 reveals hypermetabolism corresponding to the right lower quadrant, ileocolic mass consistent with the clinical history of bowel lymphoma. Diffuse marrow hypermetabolism which could represent marrow stimulation by chemotherapy or marrow involvement by lymphoma. Mild hypermetabolism of the spleen relative to the liver,nonspecific. No splenomegaly.Incidental findings, including: Coronary artery atherosclerosis.Aortic Atherosclerosis (ICD10-I70.0). Hepatic steatosis. Possible constipation. Motion degraded CT images. The right lower lobe pulmonary nodule described on prior CTs is not readily apparent but likely below PET Resolution. --Patient presents today, 02/25/2021 for a follow up after recent hospitalization for neutropenic fever. Chest xray, UA, C.Diff, blood cultures and urine cultures were negative for infectious process. Patient was treated with 5 days of IV vancomycin and cefepme. Labs from today show WBC 27.3, secondary to G-CSF, hemoglobin of 8.4. Recommend to monitor, no need for blood transfusion. Patient will return on 02/28/2021 for Cycle 6 of EPOCH.   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   #Hx of Bowel obstruction: --Patient is currently taking miralax daily. Advised to hold if she has episodes of diarrhea and take imodium PRN.    No orders of the defined types were placed in this encounter.   All questions were answered. The patient knows to call the clinic with any problems, questions or concerns.  I have spent a total of 25 minutes minutes of face-to-face and non-face-to-face time, preparing to see the patient, obtaining and/or reviewing separately obtained  history, performing a medically appropriate examination, counseling and educating the patient, ordering medications, documenting clinical information in the electronic health record, and care coordination.    Dede Query, PA-C Department of Hematology/Oncology [place] at [facility] Phone: [phone]

## 2021-03-01 ENCOUNTER — Encounter: Payer: Self-pay | Admitting: Hematology and Oncology

## 2021-03-01 DIAGNOSIS — D0511 Intraductal carcinoma in situ of right breast: Secondary | ICD-10-CM

## 2021-03-01 DIAGNOSIS — E785 Hyperlipidemia, unspecified: Secondary | ICD-10-CM

## 2021-03-01 DIAGNOSIS — D509 Iron deficiency anemia, unspecified: Secondary | ICD-10-CM

## 2021-03-01 DIAGNOSIS — C8339 Diffuse large B-cell lymphoma, extranodal and solid organ sites: Secondary | ICD-10-CM

## 2021-03-01 DIAGNOSIS — I10 Essential (primary) hypertension: Secondary | ICD-10-CM

## 2021-03-01 DIAGNOSIS — E039 Hypothyroidism, unspecified: Secondary | ICD-10-CM

## 2021-03-01 LAB — CBC WITH DIFFERENTIAL/PLATELET
Abs Immature Granulocytes: 2.83 10*3/uL — ABNORMAL HIGH (ref 0.00–0.07)
Basophils Absolute: 0.1 10*3/uL (ref 0.0–0.1)
Basophils Relative: 1 %
Eosinophils Absolute: 0 10*3/uL (ref 0.0–0.5)
Eosinophils Relative: 0 %
HCT: 26.3 % — ABNORMAL LOW (ref 36.0–46.0)
Hemoglobin: 8.2 g/dL — ABNORMAL LOW (ref 12.0–15.0)
Immature Granulocytes: 17 %
Lymphocytes Relative: 8 %
Lymphs Abs: 1.3 10*3/uL (ref 0.7–4.0)
MCH: 29.1 pg (ref 26.0–34.0)
MCHC: 31.2 g/dL (ref 30.0–36.0)
MCV: 93.3 fL (ref 80.0–100.0)
Monocytes Absolute: 1 10*3/uL (ref 0.1–1.0)
Monocytes Relative: 6 %
Neutro Abs: 11.2 10*3/uL — ABNORMAL HIGH (ref 1.7–7.7)
Neutrophils Relative %: 68 %
Platelets: 370 10*3/uL (ref 150–400)
RBC: 2.82 MIL/uL — ABNORMAL LOW (ref 3.87–5.11)
RDW: 19.9 % — ABNORMAL HIGH (ref 11.5–15.5)
WBC: 16.4 10*3/uL — ABNORMAL HIGH (ref 4.0–10.5)
nRBC: 0 % (ref 0.0–0.2)

## 2021-03-01 LAB — COMPREHENSIVE METABOLIC PANEL
ALT: 18 U/L (ref 0–44)
AST: 15 U/L (ref 15–41)
Albumin: 2.5 g/dL — ABNORMAL LOW (ref 3.5–5.0)
Alkaline Phosphatase: 96 U/L (ref 38–126)
Anion gap: 6 (ref 5–15)
BUN: 12 mg/dL (ref 8–23)
CO2: 26 mmol/L (ref 22–32)
Calcium: 8.1 mg/dL — ABNORMAL LOW (ref 8.9–10.3)
Chloride: 109 mmol/L (ref 98–111)
Creatinine, Ser: 0.53 mg/dL (ref 0.44–1.00)
GFR, Estimated: >60 mL/min (ref >60)
Glucose, Bld: 105 mg/dL — ABNORMAL HIGH (ref 70–99)
Potassium: 3.7 mmol/L (ref 3.5–5.1)
Sodium: 141 mmol/L (ref 135–145)
Total Bilirubin: 0.7 mg/dL (ref 0.3–1.2)
Total Protein: 4.5 g/dL — ABNORMAL LOW (ref 6.5–8.1)

## 2021-03-01 LAB — MAGNESIUM: Magnesium: 2 mg/dL (ref 1.7–2.4)

## 2021-03-01 MED ORDER — MAGNESIUM OXIDE -MG SUPPLEMENT 400 (240 MG) MG PO TABS
400.0000 mg | ORAL_TABLET | Freq: Every day | ORAL | Status: DC
  Administered 2021-03-01 – 2021-03-04 (×4): 400 mg via ORAL
  Filled 2021-03-01 (×4): qty 1

## 2021-03-01 MED ORDER — ETOPOSIDE CHEMO INJECTION 500 MG/25ML
Freq: Once | INTRAVENOUS | Status: AC
  Filled 2021-03-01: qty 8

## 2021-03-01 MED ORDER — SODIUM CHLORIDE 0.9 % IV SOLN
Freq: Once | INTRAVENOUS | Status: AC
  Administered 2021-03-01: 8 mg via INTRAVENOUS
  Filled 2021-03-01: qty 4

## 2021-03-01 NOTE — Progress Notes (Addendum)
HEMATOLOGY-ONCOLOGY PROGRESS NOTE  SUBJECTIVE: The patient tolerated day 1 of cycle #6 of her chemotherapy well overall.  She has no complaints this morning.  She has not had any fevers and denies chills.  She reports a good appetite this morning.  She denies mucositis, nausea, vomiting, constipation, diarrhea.  Bowels moved yesterday morning and a small amount this morning.  She plans to take her MiraLAX later today.  Denies bleeding.  She is ambulating in the hallway without any difficulty.  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 Biopsy showed invasive and in situ ductal carcinoma, HER-2 equivocal by IHC (2+), negative by FISH (ratio 1.59), ER+ >95%, PR+ 85%, Ki67 10%.    Malignant neoplasm of left breast in female, estrogen receptor positive ([place])  10/12/2020 Initial Diagnosis   Malignant neoplasm of left breast in female, estrogen receptor positive ([place])    10/12/2020 Cancer Staging   Staging form: Breast, AJCC 8th Edition - Clinical stage from 10/12/2020: Stage IA (cT1b, cN0, cM0, G2, ER+, PR+, HER2-) - Signed by Nicholas Lose, MD on 10/15/2020  Stage prefix: Initial diagnosis    11/05/2020 Surgery   Left lumpectomy: Grade 1 IDC, 1.1 cm, intermediate grade DCIS, margins are negative, lymph node -0/1, ER greater than 95%, PR 85%, HER-2 negative, Ki-67 10%   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[place])  11/11/2020 Initial Diagnosis   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[place])    11/15/2020 -  Chemotherapy    Patient is on Treatment Plan: IP NON-HODGKINS LYMPHOMA EPOCH Q21D   Patient  is on Antibody Plan: NON-HODGKINS LYMPHOMA RITUXIMAB Q21D     11/19/2020 -  Chemotherapy    Patient is on Treatment Plan: IP NON-HODGKINS LYMPHOMA EPOCH Q21D   Patient is on Antibody Plan: NON-HODGKINS LYMPHOMA RITUXIMAB Q21D        REVIEW OF SYSTEMS:   Constitutional: Denies fevers, chills  Eyes: Denies blurriness of vision Ears, nose, mouth, throat, and face: Denies mucositis or sore throat Respiratory: Denies cough, dyspnea or wheezes Cardiovascular: Denies palpitation, chest discomfort Gastrointestinal:  Denies nausea, heartburn or change in bowel habits Skin: Denies abnormal skin rashes Lymphatics: Denies new lymphadenopathy or easy bruising Neurological: Denies dizziness and headache Behavioral/Psych: Mood is stable, no new changes  Extremities: No lower extremity edema All other systems were reviewed with the patient and are negative.  I have reviewed the past medical history, past surgical history, social history and family history with the patient and they are unchanged from previous note.   PHYSICAL EXAMINATION: ECOG PERFORMANCE STATUS: 1 - Symptomatic but completely ambulatory  Vitals:   02/28/21 2110 03/01/21 0502  BP: 122/67 110/63  Pulse: 73 70  Resp: 16 17  Temp: 97.6 F (36.4 C) 97.7 F (36.5 C)  SpO2: 93% 94%   There were no vitals filed for this visit.  Intake/Output from previous day: 06/27 0701 - 06/28 0700 In: 240 [P.O.:240] Out: -   GENERAL:alert, no distress and comfortable SKIN: skin color, texture, turgor are normal, no rashes or significant lesions EYES: normal, Conjunctiva are pink and non-injected,  sclera clear OROPHARYNX:no exudate, no erythema and lips, buccal mucosa, and tongue normal  LUNGS: clear to auscultation and percussion with normal breathing effort HEART: regular rate & rhythm and no murmurs and no lower extremity edema ABDOMEN:abdomen soft, non-tender and normal bowel sounds Musculoskeletal:no cyanosis of digits and no  clubbing  NEURO: alert & oriented x 3 with fluent speech, no focal motor/sensory deficits  LABORATORY DATA:  I have reviewed the data as listed CMP Latest Ref Rng & Units 03/01/2021 02/28/2021 02/25/2021  Glucose 70 - 99 mg/dL 105(H) 122(H) 108(H)  BUN 8 - 23 mg/dL _0 Creatinine 0.44 - 1.00 mg/dL 0.53 0.65 0.55  Sodium 135 - 145 mmol/L 141 141 140  Potassium 3.5 - 5.1 mmol/L 3.7 3.7 4.0  Chloride 98 - 111 mmol/L 109 106 107  CO2 22 - 32 mmol/L _1 Calcium 8.9 - 10.3 mg/dL 8.1(L) 8.4(L) 8.0(L)  Total Protein 6.5 - 8.1 g/dL 4.5(L) 5.3(L) 5.0(L)  Total Bilirubin 0.3 - 1.2 mg/dL 0.7 0.8 0.6  Alkaline Phos 38 - 126 U/L 96 119 152(H)  AST 15 - 41 U/L _2 ALT 0 - 44 U/L _3 Lab Results  Component Value Date   WBC 16.4 (H) 03/01/2021   HGB 8.2 (L) 03/01/2021   HCT 26.3 (L) 03/01/2021   MCV 93.3 03/01/2021   PLT 370 03/01/2021   NEUTROABS 11.2 (H) 03/01/2021    DG Chest 2 View  Result Date: 02/18/2021 CLINICAL DATA:  History of asthma and lymphoma EXAM: CHEST - 2 VIEW COMPARISON:  05/07/2020 FINDINGS: Cardiac shadow is within normal limits. Right-sided chest wall port is noted in satisfactory position. Minimal scarring is noted in the bases bilaterally. No focal infiltrate or effusion is seen. Degenerative change of the thoracic spine is noted. IMPRESSION: No acute abnormality noted. Electronically Signed   By: Inez Catalina M.D.   On: 02/18/2021 19:25   CT ABDOMEN PELVIS W CONTRAST  Result Date: 02/11/2021 CLINICAL DATA:  History of large B-cell lymphoma involving the cecum and distal ileum. EXAM: CT ABDOMEN AND PELVIS WITH CONTRAST TECHNIQUE: Multidetector CT imaging of the abdomen and pelvis was performed using the standard protocol following bolus administration of intravenous contrast. CONTRAST:  46m OMNIPAQUE IOHEXOL 300 MG/ML  SOLN COMPARISON:  CT scan 10/21/2020 FINDINGS: Lower chest: Very small bilateral pleural effusions with minimal overlying  atelectasis. No worrisome pulmonary lesions. Probable radiation changes involving the anterior aspect of the right lung and there are surgical changes involving the right breast. The heart is within normal limits in size. No pericardial effusion. Hepatobiliary: No hepatic lesions are identified. Gallbladder is slightly distended no definite gallstones or findings for acute cholecystitis. No common bile duct dilatation. Pancreas: No mass, inflammation or ductal dilatation. Spleen: Normal size. No focal lesions. Adrenals/Urinary Tract: Adrenal glands and kidneys are unremarkable. The bladder is unremarkable. Stomach/Bowel: The stomach and duodenum are unremarkable. There is a fairly high-grade small-bowel obstruction noted with markedly dilated small bowel loops and air-fluid/air contrast levels throughout. The colon is decompressed. The small-bowel obstruction appears to be at the distal ileum. There is a very irregular masslike area of scarring changes, fibrosis or treated lymphoid tissue in the right lower quadrant. Surrounding adhesions or scarring in this area. The cecum now appears normal. No residual bowel wall thickening. Vascular/Lymphatic: Stable atherosclerotic calcifications involving the aorta and iliac arteries but no aneurysm. No mesenteric or retroperitoneal mass or adenopathy. Reproductive: Surgically absent. Other: Small amount  of free abdominal/free pelvic fluid likely related to the patient has small-bowel obstruction. Musculoskeletal: No significant bony findings. IMPRESSION: 1. Fairly high-grade small-bowel obstruction as detailed above. This appears to be due to a very irregular masslike area of scarring change or treated tumor in the right lower quadrant with surrounding fibrosis and adhesions. 2. No residual bowel wall thickening to suggest persistent lymphoma involving the cecum or terminal ileum. 3. Small amount of free abdominal/free pelvic fluid likely related to the small-bowel  obstruction. 4. Very small bilateral pleural effusions with minimal overlying atelectasis. These results will be called to the ordering clinician or representative by the Radiologist Assistant, and communication documented in the PACS or [employer]. Aortic Atherosclerosis (ICD10-I70.0). Electronically Signed   By: Marijo Sanes M.D.   On: 02/11/2021 19:37   DG Abd 2 Views  Result Date: 02/13/2021 CLINICAL DATA:  Small-bowel obstruction on CT EXAM: ABDOMEN - 2 VIEW COMPARISON:  CT of 2 days ago FINDINGS: Supine and upright views. The upright view demonstrates no free intraperitoneal air. Right hemidiaphragm elevation. Cardiomegaly. No upper abdominal air-fluid levels. The supine view demonstrates normal caliber gas-filled colon. Small bowel loops measure up to 4.0 cm, minimally decreased compared to the prior CT. IMPRESSION: Improvement in small-bowel obstruction pattern. No free intraperitoneal air or other acute complication. Electronically Signed   By: Abigail Miyamoto M.D.   On: 02/13/2021 10:16    ASSESSMENT AND PLAN: 1) large B-cell lymphoma -CT abdomen/pelvis with contrast 10/21/2020 - "Acute colitis/enteritis of the cecum/ascending colon and the associated terminal loops of ileum. As was recently described on CT imaging, the anatomy of distal ileum, ileocecal valve, cecum is atypical and correlation with patient's prior surgical record would be useful. The acute inflammatory changes contribute to at least a partial small bowel obstruction. Primary concern of this inflammatory cystic mass/bowel of the right lower quadrant is malignancy given that the posterior margin is inseparable from the psoas muscle, adnexa, and the right ureter. Correlation with CEA may be useful. Inflammatory/infectious changes are also a consideration." -Colonoscopy performed 11/04/2020- "An infiltrative and ulcerated partially obstructing large mass was found at 85 cm proximal to the anus. The mass was  circumferential." --Biopsy of the colon mass consistent with large B-cell lymphoma. --Patient initiated treatment with EPOCH on 11/15/2020. --PET scan from 12/17/2020 - "hypermetabolism corresponding to the right lower quadrant, ileocolic mass consistent with the clinical history of bowel lymphoma. Diffuse marrow hypermetabolism which could represent marrow stimulation by chemotherapy or marrow involvement by lymphoma. Mild hypermetabolism of the spleen relative to the liver,nonspecific. No splenomegaly.Incidental findings, including: Coronary artery atherosclerosis.Aortic Atherosclerosis (ICD10-I70.0). Hepatic steatosis. Possible constipation. Motion degraded CT images. The right lower lobe pulmonary nodule described on prior CTs is not readily apparent but likely below PET Resolution. --CT abdomen/pelvis with contrast performed 02/11/2021- "1. Fairly high-grade small-bowel obstruction as detailed above. This appears to be due to a very irregular masslike area of scarring change or treated tumor in the right lower quadrant with surrounding fibrosis and adhesions. 2. No residual bowel wall thickening to suggest persistent lymphoma involving the cecum or terminal ileum. 3. Small amount of free abdominal/free pelvic fluid likely related to the small-bowel obstruction. 4. 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  She is not bleeding.  Will transfuse for hemoglobin less than 8.   -She continues to have leukocytosis due to recent G-CSF and prednisone.  Monitor for now. -Potassium this morning remains normal and no need to restart potassium supplementation at this time.   -Magnesium normal this morning at 2.0.  She is currently receiving Mag-Ox 400 mg twice daily.  She reports occasional loose stool and will decrease her magnesium to 400 mg once a day.  We will recheck magnesium later this week prior to discharge. -Labs are adequate to proceed with day 2 of cycle #6 of her chemotherapy today as planned. -Will monitor daily CBC with differential and CMET and recheck magnesium on Friday. -Continue as needed antiemetics. -Continue MiraLAX and Senokot-S. -Lovenox for DVT prophylaxis. -Continue home medications. -Continue PPI and as needed Maalox for reflux symptoms. -We will follow-up with scheduling regarding appointments for Rituxan and Udenyca for 7/5 as well as follow-up appointments with Dr. Irene Limbo.   LOS: 1 day   Mikey Bussing, DNP, AGPCNP-BC, AOCNP 03/01/21   Patient seen and examined today.  She has tolerated chemotherapy without any major complaints.  She reports no nausea, vomiting or abdominal pain.  She denies any fevers or chills.  She denies any issues with her Port-A-Cath.   On physical examination, she is awake alert.  No distress.  Heart was regular rate and rhythm lungs are clear to auscultation.  Port-A-Cath site without any erythema or induration.  The plan is to proceed with the current treatment without any dose modification or interruption.  25  minutes were dedicated to this visit.  50% of the time was face-to-face and the time was spent on reviewing laboratory data, imaging studies, discussing treatment options, discussing differential diagnosis and  answering questions regarding future plan.

## 2021-03-01 NOTE — Progress Notes (Signed)
Called to pt's room at about 2 am, pt reported she was turning in bed and think she has pulled out her PAC. Dressing still intact but port-a-cath needle was on top of skin. Chemo infusion stopped, PAC re accessed and chemo restarted, pt tolerated procedure well.

## 2021-03-02 ENCOUNTER — Encounter: Payer: Self-pay | Admitting: Hematology and Oncology

## 2021-03-02 ENCOUNTER — Encounter: Payer: Self-pay | Admitting: Hematology

## 2021-03-02 LAB — COMPREHENSIVE METABOLIC PANEL
ALT: 18 U/L (ref 0–44)
AST: 17 U/L (ref 15–41)
Albumin: 2.3 g/dL — ABNORMAL LOW (ref 3.5–5.0)
Alkaline Phosphatase: 81 U/L (ref 38–126)
Anion gap: 3 — ABNORMAL LOW (ref 5–15)
BUN: 16 mg/dL (ref 8–23)
CO2: 26 mmol/L (ref 22–32)
Calcium: 8.2 mg/dL — ABNORMAL LOW (ref 8.9–10.3)
Chloride: 111 mmol/L (ref 98–111)
Creatinine, Ser: 0.56 mg/dL (ref 0.44–1.00)
GFR, Estimated: >60 mL/min (ref >60)
Glucose, Bld: 87 mg/dL (ref 70–99)
Potassium: 3.7 mmol/L (ref 3.5–5.1)
Sodium: 140 mmol/L (ref 135–145)
Total Bilirubin: 0.5 mg/dL (ref 0.3–1.2)
Total Protein: 4.3 g/dL — ABNORMAL LOW (ref 6.5–8.1)

## 2021-03-02 LAB — CBC WITH DIFFERENTIAL/PLATELET
Abs Immature Granulocytes: 1.01 10*3/uL — ABNORMAL HIGH (ref 0.00–0.07)
Basophils Absolute: 0.1 10*3/uL (ref 0.0–0.1)
Basophils Relative: 0 %
Eosinophils Absolute: 0 10*3/uL (ref 0.0–0.5)
Eosinophils Relative: 0 %
HCT: 25.2 % — ABNORMAL LOW (ref 36.0–46.0)
Hemoglobin: 7.8 g/dL — ABNORMAL LOW (ref 12.0–15.0)
Immature Granulocytes: 9 %
Lymphocytes Relative: 8 %
Lymphs Abs: 1 10*3/uL (ref 0.7–4.0)
MCH: 29.2 pg (ref 26.0–34.0)
MCHC: 31 g/dL (ref 30.0–36.0)
MCV: 94.4 fL (ref 80.0–100.0)
Monocytes Absolute: 1.1 10*3/uL — ABNORMAL HIGH (ref 0.1–1.0)
Monocytes Relative: 9 %
Neutro Abs: 8.3 10*3/uL — ABNORMAL HIGH (ref 1.7–7.7)
Neutrophils Relative %: 74 %
Platelets: 367 10*3/uL (ref 150–400)
RBC: 2.67 MIL/uL — ABNORMAL LOW (ref 3.87–5.11)
RDW: 20.1 % — ABNORMAL HIGH (ref 11.5–15.5)
WBC: 11.4 10*3/uL — ABNORMAL HIGH (ref 4.0–10.5)
nRBC: 0 % (ref 0.0–0.2)

## 2021-03-02 MED ORDER — ONDANSETRON HCL 40 MG/20ML IJ SOLN
Freq: Once | INTRAMUSCULAR | Status: AC
Start: 2021-03-02 — End: 2021-03-02
  Administered 2021-03-02: 8 mg via INTRAVENOUS
  Filled 2021-03-02: qty 4

## 2021-03-02 MED ORDER — VINCRISTINE SULFATE CHEMO INJECTION 1 MG/ML
Freq: Once | INTRAVENOUS | Status: AC
  Filled 2021-03-02: qty 8

## 2021-03-02 NOTE — Progress Notes (Signed)
HEMATOLOGY-ONCOLOGY PROGRESS NOTE  SUBJECTIVE: The patient tolerated day 2 of cycle #6 of her chemotherapy well overall.  She reports feeling more fatigued today with some increase shortness of breath.  She has however been ambulatory and has walked up and down the hallway exercising.  She reports her appetite is been good and her bowels are moving well.  She denies having any issues with fevers, chills, sweats, nausea, vomiting or diarrhea.  She is not having any overt signs of blood loss.  Overall she is willing and able to proceed with cycle 6-day 3 of chemotherapy today..  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 Biopsy showed invasive and in situ ductal carcinoma, HER-2 equivocal by IHC (2+), negative by FISH (ratio 1.59), ER+ >95%, PR+ 85%, Ki67 10%.    Malignant neoplasm of left breast in female, estrogen receptor positive ([place])  10/12/2020 Initial Diagnosis   Malignant neoplasm of left breast in female, estrogen receptor positive ([place])    10/12/2020 Cancer Staging   Staging form: Breast, AJCC 8th Edition - Clinical stage from 10/12/2020: Stage IA (cT1b, cN0, cM0, G2, ER+, PR+, HER2-) - Signed by Nicholas Lose, MD on 10/15/2020  Stage prefix: Initial diagnosis    11/05/2020 Surgery   Left lumpectomy: Grade 1 IDC, 1.1 cm, intermediate grade DCIS, margins are negative, lymph node -0/1, ER greater than 95%, PR 85%, HER-2 negative, Ki-67 10%   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[place])  11/11/2020 Initial Diagnosis   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[place])    11/15/2020 -   Chemotherapy    Patient is on Treatment Plan: IP NON-HODGKINS LYMPHOMA EPOCH Q21D   Patient is on Antibody Plan: NON-HODGKINS LYMPHOMA RITUXIMAB Q21D     11/19/2020 -  Chemotherapy    Patient is on Treatment Plan: IP NON-HODGKINS LYMPHOMA EPOCH Q21D   Patient is on Antibody Plan: NON-HODGKINS LYMPHOMA RITUXIMAB Q21D        REVIEW OF SYSTEMS:   Constitutional: Denies fevers, chills  Eyes: Denies blurriness of vision Ears, nose, mouth, throat, and face: Denies mucositis or sore throat Respiratory: Denies cough, dyspnea or wheezes Cardiovascular: Denies palpitation, chest discomfort Gastrointestinal:  Denies nausea, heartburn or change in bowel habits Skin: Denies abnormal skin rashes Lymphatics: Denies new lymphadenopathy or easy bruising Neurological: Denies dizziness and headache Behavioral/Psych: Mood is stable, no new changes  Extremities: No lower extremity edema All other systems were reviewed with the patient and are negative.  I have reviewed the past medical history, past surgical history, social history and family history with the patient and they are unchanged from previous note.   PHYSICAL EXAMINATION: ECOG PERFORMANCE STATUS: 1 - Symptomatic but completely ambulatory  Vitals:   03/02/21 0443 03/02/21 1618  BP: 123/70 121/69  Pulse: 61 72  Resp: 16 16  Temp: (!) 97.4 F (36.3 C) 98.2 F (36.8 C)  SpO2: 96% 97%   Filed Weights   03/02/21 0443  Weight: 125 lb 7.1 oz (56.9 kg)    Intake/Output from previous day: 06/28 0701 - 06/29 0700 In: 1222.8 [I.V.:393.2; IV Piggyback:829.[place]  Out: -   GENERAL:alert, no distress and comfortable SKIN: skin color, texture, turgor are normal, no rashes or significant lesions EYES: normal, Conjunctiva are pink and non-injected, sclera clear OROPHARYNX:no exudate, no erythema and lips, buccal mucosa, and tongue normal  LUNGS: clear to auscultation and percussion with normal breathing effort HEART: regular rate & rhythm  and no murmurs and no lower extremity edema ABDOMEN:abdomen soft, non-tender and normal bowel sounds Musculoskeletal:no cyanosis of digits and no clubbing  NEURO: alert & oriented x 3 with fluent speech, no focal motor/sensory deficits  LABORATORY DATA:  I have reviewed the data as listed CMP Latest Ref Rng & Units 03/02/2021 03/01/2021 02/28/2021  Glucose 70 - 99 mg/dL 87 105(H) 122(H)  BUN 8 - 23 mg/dL $Remove'16 12 12  'WHgmzPp$ Creatinine 0.44 - 1.00 mg/dL 0.56 0.53 0.65  Sodium 135 - 145 mmol/L 140 141 141  Potassium 3.5 - 5.1 mmol/L 3.7 3.7 3.7  Chloride 98 - 111 mmol/L 111 109 106  CO2 22 - 32 mmol/L $RemoveB'26 26 26  'FQBWbgft$ Calcium 8.9 - 10.3 mg/dL 8.2(L) 8.1(L) 8.4(L)  Total Protein 6.5 - 8.1 g/dL 4.3(L) 4.5(L) 5.3(L)  Total Bilirubin 0.3 - 1.2 mg/dL 0.5 0.7 0.8  Alkaline Phos 38 - 126 U/L 81 96 119  AST 15 - 41 U/L $Remo'17 15 21  'GuRqN$ ALT 0 - 44 U/L $Remo'18 18 23    'wZbCw$ Lab Results  Component Value Date   WBC 11.4 (H) 03/02/2021   HGB 7.8 (L) 03/02/2021   HCT 25.2 (L) 03/02/2021   MCV 94.4 03/02/2021   PLT 367 03/02/2021   NEUTROABS 8.3 (H) 03/02/2021    DG Chest 2 View  Result Date: 02/18/2021 CLINICAL DATA:  History of asthma and lymphoma EXAM: CHEST - 2 VIEW COMPARISON:  05/07/2020 FINDINGS: Cardiac shadow is within normal limits. Right-sided chest wall port is noted in satisfactory position. Minimal scarring is noted in the bases bilaterally. No focal infiltrate or effusion is seen. Degenerative change of the thoracic spine is noted. IMPRESSION: No acute abnormality noted. Electronically Signed   By: Inez Catalina M.D.   On: 02/18/2021 19:25   CT ABDOMEN PELVIS W CONTRAST  Result Date: 02/11/2021 CLINICAL DATA:  History of large B-cell lymphoma involving the cecum and distal ileum. EXAM: CT ABDOMEN AND PELVIS WITH CONTRAST TECHNIQUE: Multidetector CT imaging of the abdomen and pelvis was performed using the standard protocol following bolus administration of intravenous contrast. CONTRAST:  38mL OMNIPAQUE IOHEXOL 300  MG/ML  SOLN COMPARISON:  CT scan 10/21/2020 FINDINGS: Lower chest: Very small bilateral pleural effusions with minimal overlying atelectasis. No worrisome pulmonary lesions. Probable radiation changes involving the anterior aspect of the right lung and there are surgical changes involving the right breast. The heart is within normal limits in size. No pericardial effusion. Hepatobiliary: No hepatic lesions are identified. Gallbladder is slightly distended no definite gallstones or findings for acute cholecystitis. No common bile duct dilatation. Pancreas: No mass, inflammation or ductal dilatation. Spleen: Normal size. No focal lesions. Adrenals/Urinary Tract: Adrenal glands and kidneys are unremarkable. The bladder is unremarkable. Stomach/Bowel: The stomach and duodenum are unremarkable. There is a fairly high-grade small-bowel obstruction noted with markedly dilated small bowel loops and air-fluid/air contrast levels throughout. The colon is decompressed. The small-bowel obstruction appears to be at the distal ileum. There is a very irregular masslike area of scarring changes, fibrosis or treated lymphoid tissue in the right lower quadrant. Surrounding adhesions or scarring in this area. The cecum now appears normal. No residual bowel  wall thickening. Vascular/Lymphatic: Stable atherosclerotic calcifications involving the aorta and iliac arteries but no aneurysm. No mesenteric or retroperitoneal mass or adenopathy. Reproductive: Surgically absent. Other: Small amount of free abdominal/free pelvic fluid likely related to the patient has small-bowel obstruction. Musculoskeletal: No significant bony findings. IMPRESSION: 1. Fairly high-grade small-bowel obstruction as detailed above. This appears to be due to a very irregular masslike area of scarring change or treated tumor in the right lower quadrant with surrounding fibrosis and adhesions. 2. No residual bowel wall thickening to suggest persistent lymphoma  involving the cecum or terminal ileum. 3. Small amount of free abdominal/free pelvic fluid likely related to the small-bowel obstruction. 4. Very small bilateral pleural effusions with minimal overlying atelectasis. These results will be called to the ordering clinician or representative by the Radiologist Assistant, and communication documented in the PACS or [employer]. Aortic Atherosclerosis (ICD10-I70.0). Electronically Signed   By: Marijo Sanes M.D.   On: 02/11/2021 19:37   DG Abd 2 Views  Result Date: 02/13/2021 CLINICAL DATA:  Small-bowel obstruction on CT EXAM: ABDOMEN - 2 VIEW COMPARISON:  CT of 2 days ago FINDINGS: Supine and upright views. The upright view demonstrates no free intraperitoneal air. Right hemidiaphragm elevation. Cardiomegaly. No upper abdominal air-fluid levels. The supine view demonstrates normal caliber gas-filled colon. Small bowel loops measure up to 4.0 cm, minimally decreased compared to the prior CT. IMPRESSION: Improvement in small-bowel obstruction pattern. No free intraperitoneal air or other acute complication. Electronically Signed   By: Abigail Miyamoto M.D.   On: 02/13/2021 10:16    ASSESSMENT AND PLAN: 1) large B-cell lymphoma -CT abdomen/pelvis with contrast 10/21/2020 - "Acute colitis/enteritis of the cecum/ascending colon and the associated terminal loops of ileum. As was recently described on CT imaging, the anatomy of distal ileum, ileocecal valve, cecum is atypical and correlation with patient's prior surgical record would be useful. The acute inflammatory changes contribute to at least a partial small bowel obstruction. Primary concern of this inflammatory cystic mass/bowel of the right lower quadrant is malignancy given that the posterior margin is inseparable from the psoas muscle, adnexa, and the right ureter. Correlation with CEA may be useful. Inflammatory/infectious changes are also a consideration." -Colonoscopy performed 11/04/2020- "An infiltrative  and ulcerated partially obstructing large mass was found at 85 cm proximal to the anus. The mass was circumferential." --Biopsy of the colon mass consistent with large B-cell lymphoma. --Patient initiated treatment with EPOCH on 11/15/2020. --PET scan from 12/17/2020 - "hypermetabolism corresponding to the right lower quadrant, ileocolic mass consistent with the clinical history of bowel lymphoma. Diffuse marrow hypermetabolism which could represent marrow stimulation by chemotherapy or marrow involvement by lymphoma. Mild hypermetabolism of the spleen relative to the liver,nonspecific. No splenomegaly.Incidental findings, including: Coronary artery atherosclerosis.Aortic Atherosclerosis (ICD10-I70.0). Hepatic steatosis. Possible constipation. Motion degraded CT images. The right lower lobe pulmonary nodule described on prior CTs is not readily apparent but likely below PET Resolution. --CT abdomen/pelvis with contrast performed 02/11/2021- "1. Fairly high-grade small-bowel obstruction as detailed above. This appears to be due to a very irregular masslike area of scarring change or treated tumor in the right lower quadrant with surrounding fibrosis and adhesions. 2. No residual bowel wall thickening to suggest persistent lymphoma involving the cecum or terminal ileum. 3. Small amount of free abdominal/free pelvic fluid likely related to the small-bowel obstruction. 4. 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  She is not bleeding.  Will transfuse for hemoglobin less than 8. Patient is symptomatic today, will transfuse 1 unit. -She continues to have leukocytosis due to recent G-CSF and prednisone.  Monitor for now. -Potassium this morning remains normal and no need to restart potassium supplementation at this time.   -Magnesium normal yesterday at 2.0.  She is currently receiving Mag-Ox 400 mg twice daily.  She reports occasional loose stool and will decrease her magnesium to 400 mg once a day.  We will recheck magnesium later this week prior to discharge. -Labs are adequate to proceed with day 3 of cycle #6 of her chemotherapy today as planned. -Will monitor daily CBC with differential and CMET and recheck magnesium on Friday. -Continue as needed antiemetics. -Continue MiraLAX and Senokot-S. -Lovenox for DVT prophylaxis. -Continue home medications. -Continue PPI and as needed Maalox for reflux symptoms. -We will follow-up with scheduling regarding appointments for Rituxan and Udenyca for 7/5 as well as follow-up appointments with Dr. Irene Limbo.   LOS: 2 days   Ledell Peoples, MD Department of Hematology/Oncology [place] [facility] Phone: [phone] Pager: [phone] Email: [name]   25  minutes were dedicated to this visit.  50% of the time was face-to-face and the time was spent on reviewing laboratory data, imaging studies, discussing treatment options, discussing differential diagnosis and answering questions regarding future plan.

## 2021-03-02 NOTE — Progress Notes (Signed)
Chemotherapy dosages and calculations verified with  Brayton Layman, RN. Consent on chart.

## 2021-03-03 ENCOUNTER — Encounter: Payer: Self-pay | Admitting: Hematology and Oncology

## 2021-03-03 LAB — COMPREHENSIVE METABOLIC PANEL
ALT: 19 U/L (ref 0–44)
AST: 14 U/L — ABNORMAL LOW (ref 15–41)
Albumin: 2.4 g/dL — ABNORMAL LOW (ref 3.5–5.0)
Alkaline Phosphatase: 82 U/L (ref 38–126)
Anion gap: 5 (ref 5–15)
BUN: 16 mg/dL (ref 8–23)
CO2: 27 mmol/L (ref 22–32)
Calcium: 8.1 mg/dL — ABNORMAL LOW (ref 8.9–10.3)
Chloride: 108 mmol/L (ref 98–111)
Creatinine, Ser: 0.51 mg/dL (ref 0.44–1.00)
GFR, Estimated: >60 mL/min (ref >60)
Glucose, Bld: 94 mg/dL (ref 70–99)
Potassium: 3.7 mmol/L (ref 3.5–5.1)
Sodium: 140 mmol/L (ref 135–145)
Total Bilirubin: 0.7 mg/dL (ref 0.3–1.2)
Total Protein: 4.4 g/dL — ABNORMAL LOW (ref 6.5–8.1)

## 2021-03-03 LAB — CBC WITH DIFFERENTIAL/PLATELET
Abs Immature Granulocytes: 0.3 10*3/uL — ABNORMAL HIGH (ref 0.00–0.07)
Basophils Absolute: 0 10*3/uL (ref 0.0–0.1)
Basophils Relative: 0 %
Eosinophils Absolute: 0 10*3/uL (ref 0.0–0.5)
Eosinophils Relative: 0 %
HCT: 24.2 % — ABNORMAL LOW (ref 36.0–46.0)
Hemoglobin: 7.6 g/dL — ABNORMAL LOW (ref 12.0–15.0)
Immature Granulocytes: 5 %
Lymphocytes Relative: 10 %
Lymphs Abs: 0.6 10*3/uL — ABNORMAL LOW (ref 0.7–4.0)
MCH: 29.5 pg (ref 26.0–34.0)
MCHC: 31.4 g/dL (ref 30.0–36.0)
MCV: 93.8 fL (ref 80.0–100.0)
Monocytes Absolute: 0.5 10*3/uL (ref 0.1–1.0)
Monocytes Relative: 7 %
Neutro Abs: 5.2 10*3/uL (ref 1.7–7.7)
Neutrophils Relative %: 78 %
Platelets: 353 10*3/uL (ref 150–400)
RBC: 2.58 MIL/uL — ABNORMAL LOW (ref 3.87–5.11)
RDW: 19.5 % — ABNORMAL HIGH (ref 11.5–15.5)
WBC: 6.7 10*3/uL (ref 4.0–10.5)
nRBC: 0 % (ref 0.0–0.2)

## 2021-03-03 LAB — PREPARE RBC (CROSSMATCH)

## 2021-03-03 MED ORDER — HEPARIN SOD (PORK) LOCK FLUSH 100 UNIT/ML IV SOLN: 250.0000 [IU] | INTRAVENOUS | Status: DC | PRN

## 2021-03-03 MED ORDER — SODIUM CHLORIDE 0.9% FLUSH: 10.0000 mL | INTRAVENOUS | Status: DC | PRN

## 2021-03-03 MED ORDER — ONDANSETRON HCL 40 MG/20ML IJ SOLN
Freq: Once | INTRAMUSCULAR | Status: AC
Start: 2021-03-03 — End: 2021-03-03
  Administered 2021-03-03: 18 mg via INTRAVENOUS
  Filled 2021-03-03: qty 4

## 2021-03-03 MED ORDER — DEXAMETHASONE SODIUM PHOSPHATE 10 MG/ML IJ SOLN
Freq: Once | INTRAMUSCULAR | Status: AC
Start: 2021-03-04 — End: 2021-03-04
  Administered 2021-03-04: 16 mg via INTRAVENOUS
  Filled 2021-03-03: qty 8

## 2021-03-03 MED ORDER — SODIUM CHLORIDE 0.9% IV SOLUTION
250.0000 mL | Freq: Once | INTRAVENOUS | Status: DC
Start: 2021-03-03 — End: 2021-03-04

## 2021-03-03 MED ORDER — SODIUM CHLORIDE 0.9 % IV SOLN
750.0000 mg/m2 | Freq: Once | INTRAVENOUS | Status: AC
  Administered 2021-03-04: 1220 mg via INTRAVENOUS
  Filled 2021-03-03: qty 61

## 2021-03-03 MED ORDER — VINCRISTINE SULFATE CHEMO INJECTION 1 MG/ML
Freq: Once | INTRAVENOUS | Status: AC
  Filled 2021-03-03: qty 8

## 2021-03-03 MED ORDER — SODIUM CHLORIDE 0.9% FLUSH: 3.0000 mL | INTRAVENOUS | Status: DC | PRN

## 2021-03-03 MED ORDER — HEPARIN SOD (PORK) LOCK FLUSH 100 UNIT/ML IV SOLN
500.0000 [IU] | Freq: Every day | INTRAVENOUS | Status: AC | PRN
  Administered 2021-03-04: 500 [IU]

## 2021-03-03 NOTE — Care Management Important Message (Signed)
Important Message  Patient Details IM Letter given to the Patient. Name: Lindsey Price MRN: [id number] Date of Birth: Dec 16, 1945   Medicare Important Message Given:  Yes     Kerin Salen 03/03/2021, 9:16 AM

## 2021-03-03 NOTE — Progress Notes (Signed)
HEMATOLOGY-ONCOLOGY PROGRESS NOTE  SUBJECTIVE: The patient tolerated day 3 of cycle #6 of her chemotherapy well overall.  She reports some lightheadedness this morning.  Denies chest pain or shortness of breath.  Reports improvement in her appetite.  Denies abdominal pain, nausea, vomiting.  Bowels move this morning.  She denies having any issues with fevers, chills, sweats.  She is not having any overt signs of blood loss.  Overall she is willing and able to proceed with cycle 6-day 4 of chemotherapy today..  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 Biopsy showed invasive and in situ ductal carcinoma, HER-2 equivocal by IHC (2+), negative by FISH (ratio 1.59), ER+ >95%, PR+ 85%, Ki67 10%.    Malignant neoplasm of left breast in female, estrogen receptor positive ([place])  10/12/2020 Initial Diagnosis   Malignant neoplasm of left breast in female, estrogen receptor positive ([place])    10/12/2020 Cancer Staging   Staging form: Breast, AJCC 8th Edition - Clinical stage from 10/12/2020: Stage IA (cT1b, cN0, cM0, G2, ER+, PR+, HER2-) - Signed by Nicholas Lose, MD on 10/15/2020  Stage prefix: Initial diagnosis    11/05/2020 Surgery   Left lumpectomy: Grade 1 IDC, 1.1 cm, intermediate grade DCIS, margins are negative, lymph node -0/1, ER greater than 95%, PR 85%, HER-2 negative, Ki-67 10%   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[place])  11/11/2020 Initial Diagnosis   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[place])    11/15/2020 -  Chemotherapy    Patient is on Treatment Plan: IP NON-HODGKINS LYMPHOMA EPOCH  Q21D   Patient is on Antibody Plan: NON-HODGKINS LYMPHOMA RITUXIMAB Q21D     11/19/2020 -  Chemotherapy    Patient is on Treatment Plan: IP NON-HODGKINS LYMPHOMA EPOCH Q21D   Patient is on Antibody Plan: NON-HODGKINS LYMPHOMA RITUXIMAB Q21D        REVIEW OF SYSTEMS:   Constitutional: Denies fevers, chills  Eyes: Denies blurriness of vision Ears, nose, mouth, throat, and face: Denies mucositis or sore throat Respiratory: Denies cough, dyspnea or wheezes Cardiovascular: Denies palpitation, chest discomfort Gastrointestinal:  Denies nausea, heartburn or change in bowel habits Skin: Denies abnormal skin rashes Lymphatics: Denies new lymphadenopathy or easy bruising Neurological: Reports lightheadedness Behavioral/Psych: Mood is stable, no new changes  Extremities: No lower extremity edema All other systems were reviewed with the patient and are negative.  I have reviewed the past medical history, past surgical history, social history and family history with the patient and they are unchanged from previous note.   PHYSICAL EXAMINATION: ECOG PERFORMANCE STATUS: 1 - Symptomatic but completely ambulatory  Vitals:   03/03/21 0948 03/03/21 1014  BP: 114/63 114/63  Pulse: 73 69  Resp: 16   Temp: 98 F (36.7 C) 97.6 F (36.4 C)  SpO2: 99% 99%   Filed Weights   03/02/21 0443  Weight: 56.9 kg    Intake/Output from previous day: 06/29 0701 - 06/30 0700 In: 944.3 [P.O.:480; I.V.:10; IV Piggyback:454.3] Out: -   GENERAL:alert, no distress and comfortable SKIN: skin color, texture, turgor are normal, no rashes or significant  lesions EYES: normal, Conjunctiva are pink and non-injected, sclera clear OROPHARYNX:no exudate, no erythema and lips, buccal mucosa, and tongue normal  LUNGS: clear to auscultation and percussion with normal breathing effort HEART: regular rate & rhythm and no murmurs and no lower extremity edema ABDOMEN:abdomen soft, non-tender and normal bowel  sounds Musculoskeletal:no cyanosis of digits and no clubbing  NEURO: alert & oriented x 3 with fluent speech, no focal motor/sensory deficits  LABORATORY DATA:  I have reviewed the data as listed CMP Latest Ref Rng & Units 03/03/2021 03/02/2021 03/01/2021  Glucose 70 - 99 mg/dL 94 87 105(H)  BUN 8 - 23 mg/dL $Remove'16 16 12  'jiODWsW$ Creatinine 0.44 - 1.00 mg/dL 0.51 0.56 0.53  Sodium 135 - 145 mmol/L 140 140 141  Potassium 3.5 - 5.1 mmol/L 3.7 3.7 3.7  Chloride 98 - 111 mmol/L 108 111 109  CO2 22 - 32 mmol/L $RemoveB'27 26 26  'HAKzsKCY$ Calcium 8.9 - 10.3 mg/dL 8.1(L) 8.2(L) 8.1(L)  Total Protein 6.5 - 8.1 g/dL 4.4(L) 4.3(L) 4.5(L)  Total Bilirubin 0.3 - 1.2 mg/dL 0.7 0.5 0.7  Alkaline Phos 38 - 126 U/L 82 81 96  AST 15 - 41 U/L 14(L) 17 15  ALT 0 - 44 U/L $Remo'19 18 18    'AaKob$ Lab Results  Component Value Date   WBC 6.7 03/03/2021   HGB 7.6 (L) 03/03/2021   HCT 24.2 (L) 03/03/2021   MCV 93.8 03/03/2021   PLT 353 03/03/2021   NEUTROABS 5.2 03/03/2021    DG Chest 2 View  Result Date: 02/18/2021 CLINICAL DATA:  History of asthma and lymphoma EXAM: CHEST - 2 VIEW COMPARISON:  05/07/2020 FINDINGS: Cardiac shadow is within normal limits. Right-sided chest wall port is noted in satisfactory position. Minimal scarring is noted in the bases bilaterally. No focal infiltrate or effusion is seen. Degenerative change of the thoracic spine is noted. IMPRESSION: No acute abnormality noted. Electronically Signed   By: Inez Catalina M.D.   On: 02/18/2021 19:25   CT ABDOMEN PELVIS W CONTRAST  Result Date: 02/11/2021 CLINICAL DATA:  History of large B-cell lymphoma involving the cecum and distal ileum. EXAM: CT ABDOMEN AND PELVIS WITH CONTRAST TECHNIQUE: Multidetector CT imaging of the abdomen and pelvis was performed using the standard protocol following bolus administration of intravenous contrast. CONTRAST:  67mL OMNIPAQUE IOHEXOL 300 MG/ML  SOLN COMPARISON:  CT scan 10/21/2020 FINDINGS: Lower chest: Very small bilateral pleural effusions  with minimal overlying atelectasis. No worrisome pulmonary lesions. Probable radiation changes involving the anterior aspect of the right lung and there are surgical changes involving the right breast. The heart is within normal limits in size. No pericardial effusion. Hepatobiliary: No hepatic lesions are identified. Gallbladder is slightly distended no definite gallstones or findings for acute cholecystitis. No common bile duct dilatation. Pancreas: No mass, inflammation or ductal dilatation. Spleen: Normal size. No focal lesions. Adrenals/Urinary Tract: Adrenal glands and kidneys are unremarkable. The bladder is unremarkable. Stomach/Bowel: The stomach and duodenum are unremarkable. There is a fairly high-grade small-bowel obstruction noted with markedly dilated small bowel loops and air-fluid/air contrast levels throughout. The colon is decompressed. The small-bowel obstruction appears to be at the distal ileum. There is a very irregular masslike area of scarring changes, fibrosis or treated lymphoid tissue in the right lower quadrant. Surrounding adhesions or scarring in this area. The cecum now appears normal. No residual bowel wall thickening. Vascular/Lymphatic: Stable atherosclerotic calcifications involving the aorta and iliac arteries but no aneurysm. No mesenteric or retroperitoneal mass or adenopathy.  Reproductive: Surgically absent. Other: Small amount of free abdominal/free pelvic fluid likely related to the patient has small-bowel obstruction. Musculoskeletal: No significant bony findings. IMPRESSION: 1. Fairly high-grade small-bowel obstruction as detailed above. This appears to be due to a very irregular masslike area of scarring change or treated tumor in the right lower quadrant with surrounding fibrosis and adhesions. 2. No residual bowel wall thickening to suggest persistent lymphoma involving the cecum or terminal ileum. 3. Small amount of free abdominal/free pelvic fluid likely related to the  small-bowel obstruction. 4. Very small bilateral pleural effusions with minimal overlying atelectasis. These results will be called to the ordering clinician or representative by the Radiologist Assistant, and communication documented in the PACS or [employer]. Aortic Atherosclerosis (ICD10-I70.0). Electronically Signed   By: Marijo Sanes M.D.   On: 02/11/2021 19:37   DG Abd 2 Views  Result Date: 02/13/2021 CLINICAL DATA:  Small-bowel obstruction on CT EXAM: ABDOMEN - 2 VIEW COMPARISON:  CT of 2 days ago FINDINGS: Supine and upright views. The upright view demonstrates no free intraperitoneal air. Right hemidiaphragm elevation. Cardiomegaly. No upper abdominal air-fluid levels. The supine view demonstrates normal caliber gas-filled colon. Small bowel loops measure up to 4.0 cm, minimally decreased compared to the prior CT. IMPRESSION: Improvement in small-bowel obstruction pattern. No free intraperitoneal air or other acute complication. Electronically Signed   By: Abigail Miyamoto M.D.   On: 02/13/2021 10:16    ASSESSMENT AND PLAN: 1) large B-cell lymphoma -CT abdomen/pelvis with contrast 10/21/2020 - "Acute colitis/enteritis of the cecum/ascending colon and the associated terminal loops of ileum. As was recently described on CT imaging, the anatomy of distal ileum, ileocecal valve, cecum is atypical and correlation with patient's prior surgical record would be useful. The acute inflammatory changes contribute to at least a partial small bowel obstruction. Primary concern of this inflammatory cystic mass/bowel of the right lower quadrant is malignancy given that the posterior margin is inseparable from the psoas muscle, adnexa, and the right ureter. Correlation with CEA may be useful. Inflammatory/infectious changes are also a consideration." -Colonoscopy performed 11/04/2020- "An infiltrative and ulcerated partially obstructing large mass was found at 85 cm proximal to the anus. The mass was  circumferential." --Biopsy of the colon mass consistent with large B-cell lymphoma. --Patient initiated treatment with EPOCH on 11/15/2020. --PET scan from 12/17/2020 - "hypermetabolism corresponding to the right lower quadrant, ileocolic mass consistent with the clinical history of bowel lymphoma. Diffuse marrow hypermetabolism which could represent marrow stimulation by chemotherapy or marrow involvement by lymphoma. Mild hypermetabolism of the spleen relative to the liver,nonspecific. No splenomegaly.Incidental findings, including: Coronary artery atherosclerosis.Aortic Atherosclerosis (ICD10-I70.0). Hepatic steatosis. Possible constipation. Motion degraded CT images. The right lower lobe pulmonary nodule described on prior CTs is not readily apparent but likely below PET Resolution. --CT abdomen/pelvis with contrast performed 02/11/2021- "1. Fairly high-grade small-bowel obstruction as detailed above. This appears to be due to a very irregular masslike area of scarring change or treated tumor in the right lower quadrant with surrounding fibrosis and adhesions. 2. No residual bowel wall thickening to suggest persistent lymphoma involving the cecum or terminal ileum. 3. Small amount of free abdominal/free pelvic fluid likely related to the small-bowel obstruction. 4. 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 She is not bleeding.  Will transfuse for hemoglobin less than 8.  1 unit PRBCs has already been ordered and discussed with nursing who will plan to transfuse once her current bag of chemotherapy is completed. -Leukocytosis has now resolved.  Continue to monitor. -Potassium this morning remains normal and no need to restart potassium supplementation at this time.   -Magnesium normal 6/28 at 2.0.  Mag-Ox was reduced to 400 mg once a day.  We will recheck her magnesium level again tomorrow morning prior to discharge. -Labs are adequate to proceed with day 4 of cycle #6 of her chemotherapy today as planned. -Will monitor daily CBC with differential and CMET and recheck magnesium on Friday. -Continue as needed antiemetics. -Continue MiraLAX and Senokot-S. -Lovenox for DVT prophylaxis. -Continue home medications. -Continue PPI and as needed Maalox for reflux symptoms. -Follow-up 7/5 for Rituxan and Udenyca and a follow-up appointment with Dr. Irene Limbo.   LOS: 3 days   Mikey Bussing, DNP, AGPCNP-BC, AOCNP

## 2021-03-04 ENCOUNTER — Encounter: Payer: Self-pay | Admitting: Hematology and Oncology

## 2021-03-04 LAB — CBC WITH DIFFERENTIAL/PLATELET
Abs Immature Granulocytes: 0.11 10*3/uL — ABNORMAL HIGH (ref 0.00–0.07)
Basophils Absolute: 0 10*3/uL (ref 0.0–0.1)
Basophils Relative: 0 %
Eosinophils Absolute: 0 10*3/uL (ref 0.0–0.5)
Eosinophils Relative: 0 %
HCT: 26.4 % — ABNORMAL LOW (ref 36.0–46.0)
Hemoglobin: 8.3 g/dL — ABNORMAL LOW (ref 12.0–15.0)
Immature Granulocytes: 2 %
Lymphocytes Relative: 10 %
Lymphs Abs: 0.5 10*3/uL — ABNORMAL LOW (ref 0.7–4.0)
MCH: 28.6 pg (ref 26.0–34.0)
MCHC: 31.4 g/dL (ref 30.0–36.0)
MCV: 91 fL (ref 80.0–100.0)
Monocytes Absolute: 0.2 10*3/uL (ref 0.1–1.0)
Monocytes Relative: 4 %
Neutro Abs: 3.9 10*3/uL (ref 1.7–7.7)
Neutrophils Relative %: 84 %
Platelets: 359 10*3/uL (ref 150–400)
RBC: 2.9 MIL/uL — ABNORMAL LOW (ref 3.87–5.11)
RDW: 19.5 % — ABNORMAL HIGH (ref 11.5–15.5)
WBC: 4.7 10*3/uL (ref 4.0–10.5)
nRBC: 0 % (ref 0.0–0.2)

## 2021-03-04 LAB — COMPREHENSIVE METABOLIC PANEL
ALT: 27 U/L (ref 0–44)
AST: 23 U/L (ref 15–41)
Albumin: 2.5 g/dL — ABNORMAL LOW (ref 3.5–5.0)
Alkaline Phosphatase: 80 U/L (ref 38–126)
Anion gap: 6 (ref 5–15)
BUN: 23 mg/dL (ref 8–23)
CO2: 27 mmol/L (ref 22–32)
Calcium: 8.4 mg/dL — ABNORMAL LOW (ref 8.9–10.3)
Chloride: 108 mmol/L (ref 98–111)
Creatinine, Ser: 0.55 mg/dL (ref 0.44–1.00)
GFR, Estimated: >60 mL/min (ref >60)
Glucose, Bld: 100 mg/dL — ABNORMAL HIGH (ref 70–99)
Potassium: 3.6 mmol/L (ref 3.5–5.1)
Sodium: 141 mmol/L (ref 135–145)
Total Bilirubin: 0.7 mg/dL (ref 0.3–1.2)
Total Protein: 4.4 g/dL — ABNORMAL LOW (ref 6.5–8.1)

## 2021-03-04 LAB — PREPARE RBC (CROSSMATCH)

## 2021-03-04 LAB — MAGNESIUM: Magnesium: 2.1 mg/dL (ref 1.7–2.4)

## 2021-03-04 MED ORDER — HEPARIN SOD (PORK) LOCK FLUSH 100 UNIT/ML IV SOLN: 500.0000 [IU] | INTRAVENOUS | Status: DC | PRN

## 2021-03-04 MED ORDER — SENNOSIDES-DOCUSATE SODIUM 8.6-50 MG PO TABS: 1.0000 | ORAL_TABLET | Freq: Two times a day (BID) | ORAL | Status: DC | PRN

## 2021-03-04 MED ORDER — MAGNESIUM OXIDE -MG SUPPLEMENT 400 (240 MG) MG PO TABS: 400.0000 mg | ORAL_TABLET | Freq: Every day | ORAL | Status: DC

## 2021-03-04 MED ORDER — SODIUM CHLORIDE 0.9% IV SOLUTION: Freq: Once | INTRAVENOUS | Status: DC

## 2021-03-04 NOTE — Discharge Summary (Signed)
Discharge Summary  Patient ID: Lindsey Price MRN: [id number] DOB/AGE: [birth year] 75 y.o.  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[place])   Encounter for antineoplastic chemotherapy  Discharged Condition: good  Discharge Labs:   CBC    Component Value Date/Time   WBC 4.7 03/04/2021 0500   RBC 2.90 (L) 03/04/2021 0500   HGB 8.3 (L) 03/04/2021 0500   HGB 8.4 (L) 02/25/2021 1503   HGB 13.7 02/06/2019 0908   HGB 14.6 05/15/2014 0908   HCT 26.4 (L) 03/04/2021 0500   HCT 41.3 02/06/2019 0908   HCT 44.9 05/15/2014 0908   PLT 359 03/04/2021 0500   PLT 284 02/25/2021 1503   PLT 322 02/06/2019 0908   MCV 91.0 03/04/2021 0500   MCV 91 02/06/2019 0908   MCV 95.6 05/15/2014 0908   MCH 28.6 03/04/2021 0500   MCHC 31.4 03/04/2021 0500   RDW 19.5 (H) 03/04/2021 0500   RDW 13.1 02/06/2019 0908   RDW 12.7 05/15/2014 0908   LYMPHSABS 0.5 (L) 03/04/2021 0500   LYMPHSABS 1.3 02/06/2019 0908   LYMPHSABS 1.5 05/15/2014 0908   MONOABS 0.2 03/04/2021 0500   MONOABS 0.5 05/15/2014 0908   EOSABS 0.0 03/04/2021 0500   EOSABS 0.2 02/06/2019 0908   EOSABS 0.1 05/27/2010 1030   BASOSABS 0.0 03/04/2021 0500   BASOSABS 0.0 02/06/2019 0908   BASOSABS 0.0 05/15/2014 0908   CMP Latest Ref Rng & Units 03/04/2021 03/03/2021 03/02/2021  Glucose 70 - 99 mg/dL 100(H) 94 87  BUN 8 - 23 mg/dL $Remove'23 16 16  'XvEjlsP$ Creatinine 0.44 - 1.00 mg/dL 0.55 0.51 0.56  Sodium 135 - 145 mmol/L 141 140 140  Potassium 3.5 - 5.1 mmol/L 3.6 3.7 3.7  Chloride 98 - 111 mmol/L 108 108 111  CO2 22 - 32 mmol/L $RemoveB'27 27 26  'hQxEfCbN$ Calcium 8.9 - 10.3 mg/dL 8.4(L) 8.1(L) 8.2(L)  Total Protein 6.5 - 8.1 g/dL 4.4(L) 4.4(L) 4.3(L)  Total Bilirubin 0.3 - 1.2 mg/dL 0.7 0.7 0.5  Alkaline Phos 38 - 126 U/L 80 82 81  AST 15 - 41 U/L 23 14(L) 17  ALT 0 - 44 U/L $Remo'27 19 18    'HDPwV$ Significant Diagnostic Studies: None  Consults: None  Procedures:  1) 03/03/2021 -1 unit PRBCs 2)  03/05/1999 22-1 unit PRBCs  Disposition:  Discharge disposition: 01-Home or Self Care     Allergies as of 03/04/2021       Reactions   Advair Diskus [fluticasone-salmeterol] Other (See Comments)   Hoarseness   Neosporin [neomycin-polymyxin-gramicidin] Itching        Medication List     STOP taking these medications    Magnesium 400 MG Caps Replaced by: magnesium oxide 400 (240 Mg) MG tablet   potassium chloride SA 20 MEQ tablet Commonly known as: KLOR-CON       TAKE these medications    ADVIL COLD/SINUS PO Take 1 tablet by mouth daily as needed (headaches).   albuterol 108 (90 Base) MCG/ACT inhaler Commonly known as: ProAir HFA Inhale 1-2 puffs into the lungs every 6 (six) hours as needed for wheezing or shortness of breath.   alum & mag hydroxide-simeth 200-200-20 MG/5ML suspension Commonly known as: MAALOX/MYLANTA Take 30 mLs by mouth every 4 (four) hours as needed for indigestion.   atorvastatin 10 MG tablet Commonly known as: LIPITOR Take 1 tablet (10 mg total) by mouth at bedtime.   B COMPLEX PO Take 1 tablet by mouth daily with  breakfast.   calcium carbonate 1250 (500 Ca) MG tablet Commonly known as: OS-CAL - dosed in mg of elemental calcium Take 1,250 mg by mouth daily.   cyanocobalamin 1000 MCG/ML injection Commonly known as: (VITAMIN B-12) Inject 1 mL (1,000 mcg total) into the skin every 30 (thirty) days.   dexamethasone 4 MG tablet Commonly known as: DECADRON Take 2 tablets (8 mg total) by mouth See admin instructions. Take 8 mg by mouth two times a day with a meal starting the day after chemotherapy- for 3 days   ezetimibe 10 MG tablet Commonly known as: Zetia Take 1 tablet (10 mg total) by mouth at bedtime.   feeding supplement Liqd Take 237 mLs by mouth 2 (two) times daily between meals.   Flovent HFA 44 MCG/ACT inhaler Generic drug: fluticasone Inhale 2 puffs into the lungs daily as needed (shortness of breath).   fluticasone 50  MCG/ACT nasal spray Commonly known as: FLONASE Place 2 sprays into both nostrils daily.   HYDROcodone-acetaminophen 5-325 MG tablet Commonly known as: NORCO/VICODIN Take 1-2 tablets by mouth every 6 (six) hours as needed for moderate pain or severe pain.   levocetirizine 5 MG tablet Commonly known as: XYZAL Take 5 mg by mouth daily.   levothyroxine 88 MCG tablet Commonly known as: SYNTHROID Take 1 tablet (88 mcg total) by mouth every other day.   levothyroxine 100 MCG tablet Commonly known as: SYNTHROID Take 1 tablet (100 mcg total) by mouth every other day.   lidocaine-prilocaine cream Commonly known as: EMLA Apply 1 application topically as needed (as directed). What changed:  when to take this reasons to take this   LORazepam 0.5 MG tablet Commonly known as: Ativan Take 1 tablet (0.5 mg total) by mouth See admin instructions. Take 0.5 mg by mouth at bedtime nightly while hospitalized   magic mouthwash w/lidocaine Soln Take 5 mLs by mouth 4 (four) times daily as needed for mouth pain. Swish and swallow 1 part diphenhydramine 12.5 mg per 5 mL elixir (65ml), 1 part Maalox (do not substitute Kaopectate) 79ml, 1 part 2% viscous lidocaine (40 ml), 1 part nystatin 500000 units/ml (51ml) and 1 part distilled water (8ml). Plz dispense 200 ml   magnesium oxide 400 (240 Mg) MG tablet Commonly known as: MAG-OX Take 1 tablet (400 mg total) by mouth daily. Replaces: Magnesium 400 MG Caps   mirtazapine 15 MG tablet Commonly known as: REMERON Take 1 tablet (15 mg total) by mouth at bedtime.   montelukast 10 MG tablet Commonly known as: SINGULAIR Take 1 tablet (10 mg total) by mouth daily.   multivitamin with minerals Tabs tablet Take 1 tablet by mouth daily.   NexIUM 20 MG capsule Generic drug: esomeprazole Take 20 mg by mouth 2 (two) times daily before a meal.   ondansetron 8 MG tablet Commonly known as: Zofran Take 1 tablet (8 mg total) by mouth See admin instructions.  Take 8 mg by mouth two times a day starting the day after chemo for- 3 days, then, as needed, two times a day as needed for nausea or vomiting   polyethylene glycol 17 g packet Commonly known as: MIRALAX / GLYCOLAX Take 17 g by mouth daily.   prochlorperazine 10 MG tablet Commonly known as: COMPAZINE Take 1 tablet (10 mg total) by mouth every 6 (six) hours as needed for vomiting or nausea.   senna-docusate 8.6-50 MG tablet Commonly known as: Senokot-S Take 1 tablet by mouth 2 (two) times daily as needed for mild constipation.  sodium bicarbonate/sodium chloride Soln 1 application by Mouth Rinse route See admin instructions. Use as directed three to four times daily   venlafaxine XR 37.5 MG 24 hr capsule Commonly known as: EFFEXOR-XR Take 1 capsule (37.5 mg total) by mouth daily with breakfast.   Vitamin D (Ergocalciferol) 1.25 MG (50000 UNIT) Caps capsule Commonly known as: DRISDOL Take 1 capsule (50,000 Units total) by mouth every Friday.        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  The patient has been being followed by Dr. Lindi Adie in our office for her breast cancer.  The patient had a CT of the abdomen/pelvis with contrast due to abdominal pain performed on 10/21/2020 which showed acute colitis/enteritis of the cecum/ascending colon and the associated terminal loops of ileum, acute inflammatory changes contribute to at least a partial small bowel obstruction, primary concern of this inflammatory cystic mass/bowel of the right lower quadrant is  malignancy given that the posterior margin is inseparable from the psoas muscle, adnexa, and right ureter.  A colonoscopy was performed on 11/04/2020 which showed an infiltrative and ulcerated partially obstructing large mass was found at 85 cm proximal to the anus. The mass was circumferential.  This mass was biopsied and was consistent with large B-cell lymphoma.  Due to this new finding, the patient was referred to Dr. Irene Limbo for consideration of systemic chemotherapy for treatment of her B-cell lymphoma.  Hospital Course: The patient was admitted on 02/28/2021 to begin cycle #6 of EPOCH-R.  On the day of admission, the patient reported that she was feeling well overall.  She had a slightly decreased appetite on the day of admission but was not having any issues with abdominal pain, abdominal distention, nausea, vomiting, constipation, diarrhea.  She was not having any fevers or chills.  Labs in the day of admission were adequate for treatment.  She started day 1 of cycle #6 of her chemotherapy as planned on 02/28/2021.  She tolerated chemotherapy well this admission.  The patient's hemoglobin dropped to 7.6 on 6/30.  She was symptomatic with lightheadedness and she received 1 unit of PRBCs.  On the morning of 7/1, her hemoglobin had improved to 8.3.  Her lightheadedness had improved but she still reported significant fatigue.  Due to ongoing symptoms of fatigue, an additional unit of PRBCs was ordered.  The patient was seen on the morning of 7/1 and was feeling well with the exception of fatigue.  She ported improvement in her appetite and p.o. intake.  She was not have any issues with fevers, chills, mucositis, chest pain, shortness of breath, abdominal pain, nausea, vomiting, constipation, diarrhea.  The patient is stable to discharge to home with follow-up on 7/5 for labs, Rituxan, Udenyca, and follow-up visit with Dr. Irene Limbo.  Discharge Instructions     Care order/instruction   Complete by: As directed     Transfuse Parameters   Diet general   Complete by: As directed    Increase activity slowly   Complete by: As directed    Informed Consent Details: Physician/Practitioner Attestation; Transcribe to consent form and obtain patient signature   Complete by: As directed  Physician/Practitioner attestation of informed consent for blood and or blood product transfusion: I, the physician/practitioner, attest that I have discussed with the patient the benefits, risks, side effects, alternatives, likelihood of achieving goals and potential problems during recovery for the procedure that I have provided informed consent.   Product(s): All Product(s)   Type and screen   Complete by: As directed    [place]       Signed: Mikey Bussing 03/04/2021, 10:37 AM

## 2021-03-04 NOTE — Progress Notes (Signed)
Pt tolerated chemo well and discharged home with spouse in stable condition. Discharge instructions given. No immediate questions or concerns at this time. Pt opted to ambulate off the department

## 2021-03-05 LAB — TYPE AND SCREEN
ABO/RH(D): O POS
Antibody Screen: NEGATIVE
Unit division: 0
Unit division: 0

## 2021-03-05 LAB — BPAM RBC
Blood Product Expiration Date: 202207302359
Blood Product Expiration Date: 202208022359
ISSUE DATE / TIME: 202206300956
ISSUE DATE / TIME: 202207011234
Unit Type and Rh: 5100
Unit Type and Rh: 5100

## 2021-03-07 NOTE — Progress Notes (Signed)
HEMATOLOGY-ONCOLOGY PROGRESS NOTE   Date of Encounter: 03/08/2021  CHIEF COMPLAINT /PURPOSE OF CONSULTATION: Large B-Cell Lymphoma   HISTORY OF PRESENTING ILLNESS See previous note.    INTERVAL HISTORY  Lindsey Price is a wonderful 75 y.o. 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 The patient's last visit with [place] was on 01/26/2021. The pt reports that she is doing well overall.  The pt reports that she has been feeling better and improving from her treatments. She recently completed her treatment and cycle 6. The patient notes some continued stomach cramping that is intermittent and painful. She has been getting weaker due to not eating as much. Her appetite is improving as well. She takes Miralax as needed.  Of note since the patient's last visit, pt has had CT AP w contrast ([id number]) on 02/11/2021, which revealed "1. Fairly high-grade small-bowel obstruction as detailed above. This appears to be due to a very irregular masslike area of scarring change or treated tumor in the right lower quadrant with surrounding fibrosis and adhesions. 2. No residual bowel wall thickening to suggest persistent lymphoma involving the cecum or terminal ileum. 3. Small amount of free abdominal/free pelvic fluid likely related to the small-bowel obstruction. 4. Very small bilateral pleural effusions with minimal overlying atelectasis."  Lab results today 03/08/2021 of CBC w/diff and CMP is as follows: all values are WNL except for Hgb of 11.6, HCT of 35.2, RDW of 18.2, Total protein of 5.8, Albumin of 3.3. 03/08/2021 Magnesium of 1.7.  On review of systems, pt reports intermittent stomach pain, overall weakness and cramping, reflux and denies fevers, vomiting, nausea, decreased appetite, difficulty moving bowels, port site pain, difficulty passing urine, leg swelling, and any other symptoms.   Oncology History  Cancer of right breast,  stage 0  09/13/2009 Initial Biopsy   Right breast core needle biopsy: 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 Biopsy showed invasive and in situ ductal carcinoma, HER-2 equivocal by IHC (2+), negative by FISH (ratio 1.59), ER+ >95%, PR+ 85%, Ki67 10%.    Malignant neoplasm of left breast in female, estrogen receptor positive ([place])  10/12/2020 Initial Diagnosis   Malignant neoplasm of left breast in female, estrogen receptor positive ([place])    10/12/2020 Cancer Staging   Staging form: Breast, AJCC 8th Edition - Clinical stage from 10/12/2020: Stage IA (cT1b, cN0, cM0, G2, ER+, PR+, HER2-) - Signed by Nicholas Lose, MD on 10/15/2020  Stage prefix: Initial diagnosis    11/05/2020 Surgery   Left lumpectomy: Grade 1 IDC, 1.1 cm, intermediate grade DCIS, margins are negative, lymph node -0/1, ER greater than 95%, PR 85%, HER-2 negative, Ki-67 10%   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[place])  11/11/2020 Initial Diagnosis   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[place])    11/15/2020 -  Chemotherapy    Patient is on Treatment Plan: IP NON-HODGKINS LYMPHOMA EPOCH Q21D   Patient is on Antibody Plan: NON-HODGKINS LYMPHOMA RITUXIMAB Q21D     11/19/2020 -  Chemotherapy    Patient is on Treatment Plan: IP NON-HODGKINS LYMPHOMA EPOCH Q21D   Patient is on Antibody Plan: NON-HODGKINS LYMPHOMA RITUXIMAB  Q21D       REVIEW OF SYSTEMS:   10 Point review of Systems was done is negative except as noted above.  PHYSICAL EXAMINATION: ECOG PERFORMANCE STATUS: 1 - Symptomatic but completely ambulatory VS reviewed  Exam was given in infusion.   GENERAL:alert, in no acute distress and comfortable SKIN: no acute  rashes, no significant lesions EYES: conjunctiva are pink and non-injected, sclera anicteric OROPHARYNX: MMM, no exudates, no oropharyngeal erythema or ulceration NECK: supple, no JVD LYMPH:  no palpable lymphadenopathy in the cervical, axillary or inguinal regions LUNGS: clear to auscultation b/l with normal respiratory effort HEART: regular rate & rhythm ABDOMEN:  normoactive bowel sounds , non tender, not distended. Extremity: no pedal edema PSYCH: alert & oriented x 3 with fluent speech NEURO: no focal motor/sensory deficits   LABORATORY DATA:  I have reviewed the data as listed CMP Latest Ref Rng & Units 03/08/2021 03/04/2021 03/03/2021  Glucose 70 - 99 mg/dL 96 100(H) 94  BUN 8 - 23 mg/dL _0 Creatinine 0.44 - 1.00 mg/dL 0.63 0.55 0.51  Sodium 135 - 145 mmol/L 139 141 140  Potassium 3.5 - 5.1 mmol/L 4.0 3.6 3.7  Chloride 98 - 111 mmol/L 102 108 108  CO2 22 - 32 mmol/L _1 Calcium 8.9 - 10.3 mg/dL 9.6 8.4(L) 8.1(L)  Total Protein 6.5 - 8.1 g/dL 5.8(L) 4.4(L) 4.4(L)  Total Bilirubin 0.3 - 1.2 mg/dL 1.2 0.7 0.7  Alkaline Phos 38 - 126 U/L 86 80 82  AST 15 - 41 U/L 18 23 14(L)  ALT 0 - 44 U/L 42 27 19    Lab Results  Component Value Date   WBC 9.6 03/08/2021   HGB 11.6 (L) 03/08/2021   HCT 35.2 (L) 03/08/2021   MCV 88.0 03/08/2021   PLT 299 03/08/2021   NEUTROABS 9.1 (H) 03/08/2021    DG Chest 2 View  Result Date: 02/18/2021 CLINICAL DATA:  History of asthma and lymphoma EXAM: CHEST - 2 VIEW COMPARISON:  05/07/2020 FINDINGS: Cardiac shadow is within normal limits. Right-sided chest wall port is noted in satisfactory position. Minimal scarring is noted in the bases bilaterally. No focal infiltrate or effusion is seen. Degenerative change of the thoracic spine is noted. IMPRESSION: No acute abnormality noted. Electronically Signed   By: Inez Catalina M.D.   On: 02/18/2021 19:25   CT ABDOMEN PELVIS W CONTRAST  Result Date: 02/11/2021 CLINICAL DATA:  History of  large B-cell lymphoma involving the cecum and distal ileum. EXAM: CT ABDOMEN AND PELVIS WITH CONTRAST TECHNIQUE: Multidetector CT imaging of the abdomen and pelvis was performed using the standard protocol following bolus administration of intravenous contrast. CONTRAST:  68m OMNIPAQUE IOHEXOL 300 MG/ML  SOLN COMPARISON:  CT scan 10/21/2020 FINDINGS: Lower chest: Very small bilateral pleural effusions with minimal overlying atelectasis. No worrisome pulmonary lesions. Probable radiation changes involving the anterior aspect of the right lung and there are surgical changes involving the right breast. The heart is within normal limits in size. No pericardial effusion. Hepatobiliary: No hepatic lesions are identified. Gallbladder is slightly distended no definite gallstones or findings for acute cholecystitis. No common bile duct dilatation. Pancreas: No mass, inflammation or ductal dilatation. Spleen: Normal size. No focal lesions. Adrenals/Urinary Tract: Adrenal glands and kidneys are unremarkable. The bladder is unremarkable. Stomach/Bowel: The stomach and duodenum are unremarkable. There is a fairly high-grade small-bowel obstruction noted with markedly dilated small bowel loops and air-fluid/air contrast levels throughout. The colon is decompressed. The small-bowel  obstruction appears to be at the distal ileum. There is a very irregular masslike area of scarring changes, fibrosis or treated lymphoid tissue in the right lower quadrant. Surrounding adhesions or scarring in this area. The cecum now appears normal. No residual bowel wall thickening. Vascular/Lymphatic: Stable atherosclerotic calcifications involving the aorta and iliac arteries but no aneurysm. No mesenteric or retroperitoneal mass or adenopathy. Reproductive: Surgically absent. Other: Small amount of free abdominal/free pelvic fluid likely related to the patient has small-bowel obstruction. Musculoskeletal: No significant bony findings. IMPRESSION:  1. Fairly high-grade small-bowel obstruction as detailed above. This appears to be due to a very irregular masslike area of scarring change or treated tumor in the right lower quadrant with surrounding fibrosis and adhesions. 2. No residual bowel wall thickening to suggest persistent lymphoma involving the cecum or terminal ileum. 3. Small amount of free abdominal/free pelvic fluid likely related to the small-bowel obstruction. 4. Very small bilateral pleural effusions with minimal overlying atelectasis. These results will be called to the ordering clinician or representative by the Radiologist Assistant, and communication documented in the PACS or [employer]. Aortic Atherosclerosis (ICD10-I70.0). Electronically Signed   By: Marijo Sanes M.D.   On: 02/11/2021 19:37   DG Abd 2 Views  Result Date: 02/13/2021 CLINICAL DATA:  Small-bowel obstruction on CT EXAM: ABDOMEN - 2 VIEW COMPARISON:  CT of 2 days ago FINDINGS: Supine and upright views. The upright view demonstrates no free intraperitoneal air. Right hemidiaphragm elevation. Cardiomegaly. No upper abdominal air-fluid levels. The supine view demonstrates normal caliber gas-filled colon. Small bowel loops measure up to 4.0 cm, minimally decreased compared to the prior CT. IMPRESSION: Improvement in small-bowel obstruction pattern. No free intraperitoneal air or other acute complication. Electronically Signed   By: Abigail Miyamoto M.D.   On: 02/13/2021 10:16    ASSESSMENT AND PLAN:  1) large B-cell lymphoma -CT abdomen/pelvis with contrast 10/21/2020 - "Acute colitis/enteritis of the cecum/ascending colon and the associated terminal loops of ileum. As was recently described on CT imaging, the anatomy of distal ileum, ileocecal valve, cecum is atypical and correlation with patient's prior surgical record would be useful. The acute inflammatory changes contribute to at least a partial small bowel obstruction. Primary concern of this inflammatory cystic  mass/bowel of the right lower quadrant is malignancy given that the posterior margin is inseparable from the psoas muscle, adnexa, and the right ureter. Correlation with CEA may be useful. Inflammatory/infectious changes are also a consideration." -Colonoscopy performed 11/04/2020- "An infiltrative and ulcerated partially obstructing large mass was found at 85 cm proximal to the anus. 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 -Plan is for adjuvant radiation therapy followed by adjuvant antiestrogen therapy after completion of treatment for diffuse large B cell lymphoma   3)  iron deficiency anemia -Labs from 11/15/2020-ferritin 34, iron 24, percent saturation 8%, TIBC 308 -Labs from 11/16/2020-vitamin B12 level 241, folate 7.4   4) history of right breast cancer diagnosed in 2011 status post lumpectomy and 5 years of antiestrogen therapy   5) depression/anxiety   6) hyperlipidemia   7) hypertension   8) hypothyroidism   9) allergies   10) Iron deficiency   11) B12 deficiency   12) GERD   PLAN:  -Discussed pt labwork today, 03/08/2021; counts improving, chemistries stable, magnesium normal. -Will continue to monitor labs for transfusion needs. -Continue to drink at least 2-3 Ensures and supplemental drinks  daily in addition to meals. -Advised pt to take the Remeron consistently every night to help with increasing appetite. -Recommended patient avoid large salads and hard/high residue foods. Pasta, rice, lean meat are all great options. Walk 5-10 minutes after main meals. -Recommended pt walk 20-30 minutes daily, eat healthy, sleep well, and drink 48-64 oz water daily. -Continue Vitamin B-Complex. Continue B12 shots. -Labs in 1 week. -PET scan in 3 weeks. -Will see back in 4 weeks with labs.   FOLLOW UP: Labs and port flush  w 1 unit PRBC if needed in 1 week PET CT in 3  weeks RTC w Dr Irene Limbo w port flush and labs in 4 weeks      All of the patients questions were answered with apparent satisfaction. The patient knows to call the clinic with any problems, questions or concerns.    The total time spent in the appointment was 30 minutes and more than 50% was on counseling and direct patient cares.    Sullivan Lone MD [place] AAHIVMS [facility] Hematology/Oncology Physician [facility]  (Office):       [phone] (Work cell):  [phone] (Fax):           [phone]   I, Reinaldo Raddle, am acting as scribe for Dr. Sullivan Lone, MD  .I have reviewed the above documentation for accuracy and completeness, and I agree with the above. Brunetta Genera MD

## 2021-03-08 ENCOUNTER — Other Ambulatory Visit: Payer: Self-pay | Admitting: Hematology

## 2021-03-08 ENCOUNTER — Other Ambulatory Visit: Payer: PPO

## 2021-03-08 ENCOUNTER — Encounter: Payer: Self-pay | Admitting: *Deleted

## 2021-03-08 ENCOUNTER — Inpatient Hospital Stay: Payer: PPO

## 2021-03-08 ENCOUNTER — Inpatient Hospital Stay: Payer: PPO | Admitting: Dietician

## 2021-03-08 ENCOUNTER — Other Ambulatory Visit: Payer: Self-pay

## 2021-03-08 ENCOUNTER — Inpatient Hospital Stay: Payer: PPO | Admitting: Hematology

## 2021-03-08 ENCOUNTER — Inpatient Hospital Stay: Payer: PPO | Attending: Hematology

## 2021-03-08 VITALS — BP 123/63 | HR 81 | Temp 97.9°F | Resp 17

## 2021-03-08 DIAGNOSIS — C8519 Unspecified B-cell lymphoma, extranodal and solid organ sites: Secondary | ICD-10-CM | POA: Insufficient documentation

## 2021-03-08 DIAGNOSIS — C8510 Unspecified B-cell lymphoma, unspecified site: Secondary | ICD-10-CM

## 2021-03-08 DIAGNOSIS — D649 Anemia, unspecified: Secondary | ICD-10-CM

## 2021-03-08 DIAGNOSIS — Z5189 Encounter for other specified aftercare: Secondary | ICD-10-CM | POA: Insufficient documentation

## 2021-03-08 DIAGNOSIS — Z95828 Presence of other vascular implants and grafts: Secondary | ICD-10-CM

## 2021-03-08 DIAGNOSIS — Z170 Estrogen receptor positive status [ER+]: Secondary | ICD-10-CM

## 2021-03-08 DIAGNOSIS — Z5112 Encounter for antineoplastic immunotherapy: Secondary | ICD-10-CM | POA: Insufficient documentation

## 2021-03-08 LAB — CBC WITH DIFFERENTIAL (CANCER CENTER ONLY)
Abs Immature Granulocytes: 0.14 10*3/uL — ABNORMAL HIGH (ref 0.00–0.07)
Basophils Absolute: 0 10*3/uL (ref 0.0–0.1)
Basophils Relative: 0 %
Eosinophils Absolute: 0 10*3/uL (ref 0.0–0.5)
Eosinophils Relative: 0 %
HCT: 35.2 % — ABNORMAL LOW (ref 36.0–46.0)
Hemoglobin: 11.6 g/dL — ABNORMAL LOW (ref 12.0–15.0)
Immature Granulocytes: 2 %
Lymphocytes Relative: 2 %
Lymphs Abs: 0.2 10*3/uL — ABNORMAL LOW (ref 0.7–4.0)
MCH: 29 pg (ref 26.0–34.0)
MCHC: 33 g/dL (ref 30.0–36.0)
MCV: 88 fL (ref 80.0–100.0)
Monocytes Absolute: 0 10*3/uL — ABNORMAL LOW (ref 0.1–1.0)
Monocytes Relative: 0 %
Neutro Abs: 9.1 10*3/uL — ABNORMAL HIGH (ref 1.7–7.7)
Neutrophils Relative %: 96 %
Platelet Count: 299 10*3/uL (ref 150–400)
RBC: 4 MIL/uL (ref 3.87–5.11)
RDW: 18.2 % — ABNORMAL HIGH (ref 11.5–15.5)
WBC Count: 9.6 10*3/uL (ref 4.0–10.5)
nRBC: 0 % (ref 0.0–0.2)

## 2021-03-08 LAB — CMP (CANCER CENTER ONLY)
ALT: 42 U/L (ref 0–44)
AST: 18 U/L (ref 15–41)
Albumin: 3.3 g/dL — ABNORMAL LOW (ref 3.5–5.0)
Alkaline Phosphatase: 86 U/L (ref 38–126)
Anion gap: 10 (ref 5–15)
BUN: 23 mg/dL (ref 8–23)
CO2: 27 mmol/L (ref 22–32)
Calcium: 9.6 mg/dL (ref 8.9–10.3)
Chloride: 102 mmol/L (ref 98–111)
Creatinine: 0.63 mg/dL (ref 0.44–1.00)
GFR, Estimated: >60 mL/min (ref >60)
Glucose, Bld: 96 mg/dL (ref 70–99)
Potassium: 4 mmol/L (ref 3.5–5.1)
Sodium: 139 mmol/L (ref 135–145)
Total Bilirubin: 1.2 mg/dL (ref 0.3–1.2)
Total Protein: 5.8 g/dL — ABNORMAL LOW (ref 6.5–8.1)

## 2021-03-08 LAB — MAGNESIUM: Magnesium: 1.7 mg/dL (ref 1.7–2.4)

## 2021-03-08 MED ORDER — SODIUM CHLORIDE 0.9% FLUSH
10.0000 mL | INTRAVENOUS | Status: DC | PRN
  Administered 2021-03-08: 10 mL
  Filled 2021-03-08: qty 10

## 2021-03-08 MED ORDER — PEGFILGRASTIM-CBQV 6 MG/0.6ML ~~LOC~~ SOSY
PREFILLED_SYRINGE | SUBCUTANEOUS | Status: AC
  Filled 2021-03-08: qty 0.6

## 2021-03-08 MED ORDER — ACETAMINOPHEN 325 MG PO TABS
650.0000 mg | ORAL_TABLET | Freq: Once | ORAL | Status: AC
  Administered 2021-03-08: 650 mg via ORAL

## 2021-03-08 MED ORDER — METHYLPREDNISOLONE SODIUM SUCC 125 MG IJ SOLR
60.0000 mg | Freq: Every day | INTRAMUSCULAR | Status: DC
  Administered 2021-03-08: 60 mg via INTRAVENOUS

## 2021-03-08 MED ORDER — ACETAMINOPHEN 325 MG PO TABS
ORAL_TABLET | ORAL | Status: AC
  Filled 2021-03-08: qty 2

## 2021-03-08 MED ORDER — DIPHENHYDRAMINE HCL 25 MG PO CAPS
ORAL_CAPSULE | ORAL | Status: AC
  Filled 2021-03-08: qty 2

## 2021-03-08 MED ORDER — DIPHENHYDRAMINE HCL 25 MG PO CAPS
50.0000 mg | ORAL_CAPSULE | Freq: Once | ORAL | Status: AC
  Administered 2021-03-08: 50 mg via ORAL

## 2021-03-08 MED ORDER — SODIUM CHLORIDE 0.9% FLUSH
10.0000 mL | INTRAVENOUS | Status: DC | PRN
  Filled 2021-03-08: qty 10

## 2021-03-08 MED ORDER — METHYLPREDNISOLONE SODIUM SUCC 125 MG IJ SOLR
INTRAMUSCULAR | Status: AC
  Filled 2021-03-08: qty 2

## 2021-03-08 MED ORDER — SODIUM CHLORIDE 0.9 % IV SOLN
375.0000 mg/m2 | Freq: Once | INTRAVENOUS | Status: AC
  Administered 2021-03-08: 600 mg via INTRAVENOUS
  Filled 2021-03-08: qty 50

## 2021-03-08 MED ORDER — SODIUM CHLORIDE 0.9 % IV SOLN
Freq: Once | INTRAVENOUS | Status: AC
  Filled 2021-03-08: qty 250

## 2021-03-08 MED ORDER — PEGFILGRASTIM-CBQV 6 MG/0.6ML ~~LOC~~ SOSY
6.0000 mg | PREFILLED_SYRINGE | Freq: Once | SUBCUTANEOUS | Status: AC
  Administered 2021-03-08: 6 mg via SUBCUTANEOUS

## 2021-03-08 MED ORDER — FAMOTIDINE 20 MG IN NS 100 ML IVPB
20.0000 mg | Freq: Once | INTRAVENOUS | Status: AC
Start: 2021-03-08 — End: 2021-03-08
  Administered 2021-03-08: 20 mg via INTRAVENOUS

## 2021-03-08 MED ORDER — HEPARIN SOD (PORK) LOCK FLUSH 100 UNIT/ML IV SOLN
500.0000 [IU] | Freq: Once | INTRAVENOUS | Status: AC | PRN
  Administered 2021-03-08: 500 [IU]
  Filled 2021-03-08: qty 5

## 2021-03-08 NOTE — Progress Notes (Signed)
Nutrition Assessment   Reason for Assessment: Provider request   ASSESSMENT: 75 year old female with B-cell lymphoma. She is receiving EPOCH. Patient is followed by Dr. Irene Limbo.  Noted hospital admission 6/17-6/22 for neutropenic fever.   Met with patient in infusion. She reports doing well and glad to be finishing her final treatment today. Patient reports she has been in the hospital a total of 30 days while receiving treatment as inpatient. She is grateful for the staff and the care she has received here. Patient says she is going to [employer] and ordering a hamburger when she leaves here today. Patient reports eating small frequent meals, she is unable to eat much at one time. She reports history of bowel obstruction, takes Miralax daily. Patient packed a [place] sandwich with extra mayonnaise and swiss cheese for lunch. She has has eaten half. She reports adding extra calories where she can. Patient states her husband is very supportive, will go and get her anything she wants. Patient reports extreme fatigue lasting ~5 days after treatment, denies nausea, vomiting, diarrhea, constipation. Patient has a treadmill at home, reports she tries to walk 30 minutes/day.    Medications: Senokot, Mag-ox, Decadron, Zofran, Vit D, Ativan, B complex, Remeron, Hydrocodone, MVI   Labs: reviewed   Anthropometrics: Weight 125 lb 7.1 oz on 6/29 increased from 122 lb 9.2 oz on 6/20 decreased from 128 lb 8 oz on 5/23 and 133 lb 6.1 oz on 4/27  Height: 5'6" Weight: 125 lb 7.1 oz (6/29) UBW: 135-140 lb (per pt) BMI: 20.25    NUTRITION DIAGNOSIS: Unintentional weight loss related to cancer and associated treatments as evidenced by 10% (~15 lbs) under reported usual weight. This is significant.    INTERVENTION:  Continue eating small frequent meals and snacks Continue eating high calorie, high protein foods to promote weight gain Encouraged walking after meals to aid with digestion Encouraged  drinking 2 Ensure Plus/equivalent daily (350 kcal, 16 g protein) coupons provided Continue daily Miralax for constipation Patient provided contact information   MONITORING, EVALUATION, GOAL:  Patient will tolerate increased calories and protein to prevent further weight loss   Next Visit: No follow-up scheduled. Pt has contact information and encouraged to call with questions/concerns

## 2021-03-08 NOTE — Patient Instructions (Signed)
[facility] MEDICAL ONCOLOGY  Discharge Instructions: Thank you for choosing [facility] to provide your oncology and hematology care.   If you have a lab appointment with the Cancer Center, please go directly to the Cancer Center and check in at the registration area.   Wear comfortable clothing and clothing appropriate for easy access to any Portacath or PICC line.   We strive to give you quality time with your provider. You may need to reschedule your appointment if you arrive late (15 or more minutes).  Arriving late affects you and other patients whose appointments are after yours.  Also, if you miss three or more appointments without notifying the office, you may be dismissed from the clinic at the provider's discretion.      For prescription refill requests, have your pharmacy contact our office and allow 72 hours for refills to be completed.    Today you received the following chemotherapy and/or immunotherapy agents Rituximab      To help prevent nausea and vomiting after your treatment, we encourage you to take your nausea medication as directed.  BELOW ARE SYMPTOMS THAT SHOULD BE REPORTED IMMEDIATELY: *FEVER GREATER THAN 100.4 F (38 C) OR HIGHER *CHILLS OR SWEATING *NAUSEA AND VOMITING THAT IS NOT CONTROLLED WITH YOUR NAUSEA MEDICATION *UNUSUAL SHORTNESS OF BREATH *UNUSUAL BRUISING OR BLEEDING *URINARY PROBLEMS (pain or burning when urinating, or frequent urination) *BOWEL PROBLEMS (unusual diarrhea, constipation, pain near the anus) TENDERNESS IN MOUTH AND THROAT WITH OR WITHOUT PRESENCE OF ULCERS (sore throat, sores in mouth, or a toothache) UNUSUAL RASH, SWELLING OR PAIN  UNUSUAL VAGINAL DISCHARGE OR ITCHING   Items with * indicate a potential emergency and should be followed up as soon as possible or go to the Emergency Department if any problems should occur.  Please show the CHEMOTHERAPY ALERT CARD or IMMUNOTHERAPY ALERT CARD at check-in to  the Emergency Department and triage nurse.  Should you have questions after your visit or need to cancel or reschedule your appointment, please contact [facility]  Dept: [phone]  and follow the prompts.  Office hours are 8:00 a.m. to 4:30 p.m. Monday - Friday. Please note that voicemails left after 4:00 p.m. may not be returned until the following business day.  We are closed weekends and major holidays. You have access to a nurse at all times for urgent questions. Please call the main number to the clinic Dept: [phone] and follow the prompts.   For any non-urgent questions, you may also contact your provider using MyChart. We now offer e-Visits for anyone 18 and older to request care online for non-urgent symptoms. For details visit [email or web address].   Also download the MyChart app! Go to the app store, search "MyChart", open the app, select [facility], and log in with your MyChart username and password.  Due to Covid, a mask is required upon entering the hospital/clinic. If you do not have a mask, one will be given to you upon arrival. For doctor visits, patients may have 1 support person aged 18 or older with them. For treatment visits, patients cannot have anyone with them due to current Covid guidelines and our immunocompromised population.   

## 2021-03-09 ENCOUNTER — Encounter: Payer: Self-pay | Admitting: Hematology and Oncology

## 2021-03-09 ENCOUNTER — Telehealth: Payer: Self-pay | Admitting: Hematology

## 2021-03-09 ENCOUNTER — Encounter: Payer: Self-pay | Admitting: Hematology

## 2021-03-09 NOTE — Telephone Encounter (Signed)
Scheduled follow-up appointments per 7/5 los. Patient is aware. 

## 2021-03-11 ENCOUNTER — Other Ambulatory Visit: Payer: Self-pay | Admitting: Gastroenterology

## 2021-03-14 ENCOUNTER — Encounter: Payer: Self-pay | Admitting: Hematology

## 2021-03-14 ENCOUNTER — Encounter: Payer: Self-pay | Admitting: Hematology and Oncology

## 2021-03-15 ENCOUNTER — Other Ambulatory Visit: Payer: Self-pay | Admitting: Hematology

## 2021-03-15 MED ORDER — SACCHAROMYCES BOULARDII 250 MG PO CAPS: 250.0000 mg | ORAL_CAPSULE | Freq: Two times a day (BID) | ORAL | 0 refills | Status: AC

## 2021-03-15 MED ORDER — LEVOFLOXACIN 500 MG PO TABS: 500.0000 mg | ORAL_TABLET | Freq: Every day | ORAL | 0 refills | Status: AC

## 2021-03-17 ENCOUNTER — Other Ambulatory Visit: Payer: Self-pay

## 2021-03-17 ENCOUNTER — Inpatient Hospital Stay: Payer: PPO

## 2021-03-17 DIAGNOSIS — Z170 Estrogen receptor positive status [ER+]: Secondary | ICD-10-CM

## 2021-03-17 DIAGNOSIS — C50412 Malignant neoplasm of upper-outer quadrant of left female breast: Secondary | ICD-10-CM

## 2021-03-17 DIAGNOSIS — Z95828 Presence of other vascular implants and grafts: Secondary | ICD-10-CM

## 2021-03-17 DIAGNOSIS — Z5112 Encounter for antineoplastic immunotherapy: Secondary | ICD-10-CM | POA: Diagnosis not present

## 2021-03-17 DIAGNOSIS — C8510 Unspecified B-cell lymphoma, unspecified site: Secondary | ICD-10-CM

## 2021-03-17 LAB — CBC WITH DIFFERENTIAL (CANCER CENTER ONLY)
Abs Immature Granulocytes: 1.5 10*3/uL — ABNORMAL HIGH (ref 0.00–0.07)
Band Neutrophils: 21 %
Basophils Absolute: 0 10*3/uL (ref 0.0–0.1)
Basophils Relative: 0 %
Eosinophils Absolute: 0 10*3/uL (ref 0.0–0.5)
Eosinophils Relative: 0 %
HCT: 30.1 % — ABNORMAL LOW (ref 36.0–46.0)
Hemoglobin: 9.9 g/dL — ABNORMAL LOW (ref 12.0–15.0)
Lymphocytes Relative: 8 %
Lymphs Abs: 1.2 10*3/uL (ref 0.7–4.0)
MCH: 29.2 pg (ref 26.0–34.0)
MCHC: 32.9 g/dL (ref 30.0–36.0)
MCV: 88.8 fL (ref 80.0–100.0)
Metamyelocytes Relative: 5 %
Monocytes Absolute: 1.3 10*3/uL — ABNORMAL HIGH (ref 0.1–1.0)
Monocytes Relative: 9 %
Myelocytes: 5 %
Neutro Abs: 10.9 10*3/uL — ABNORMAL HIGH (ref 1.7–7.7)
Neutrophils Relative %: 52 %
Platelet Count: 218 10*3/uL (ref 150–400)
RBC: 3.39 MIL/uL — ABNORMAL LOW (ref 3.87–5.11)
RDW: 18.6 % — ABNORMAL HIGH (ref 11.5–15.5)
WBC Count: 14.9 10*3/uL — ABNORMAL HIGH (ref 4.0–10.5)
WBC Morphology: INCREASED
nRBC: 0.3 % — ABNORMAL HIGH (ref 0.0–0.2)

## 2021-03-17 LAB — CMP (CANCER CENTER ONLY)
ALT: 24 U/L (ref 0–44)
AST: 22 U/L (ref 15–41)
Albumin: 2.6 g/dL — ABNORMAL LOW (ref 3.5–5.0)
Alkaline Phosphatase: 114 U/L (ref 38–126)
Anion gap: 8 (ref 5–15)
BUN: 10 mg/dL (ref 8–23)
CO2: 27 mmol/L (ref 22–32)
Calcium: 8 mg/dL — ABNORMAL LOW (ref 8.9–10.3)
Chloride: 107 mmol/L (ref 98–111)
Creatinine: 0.59 mg/dL (ref 0.44–1.00)
GFR, Estimated: >60 mL/min (ref >60)
Glucose, Bld: 83 mg/dL (ref 70–99)
Potassium: 3.2 mmol/L — ABNORMAL LOW (ref 3.5–5.1)
Sodium: 142 mmol/L (ref 135–145)
Total Bilirubin: 0.4 mg/dL (ref 0.3–1.2)
Total Protein: 5.2 g/dL — ABNORMAL LOW (ref 6.5–8.1)

## 2021-03-17 LAB — LACTATE DEHYDROGENASE: LDH: 342 U/L — ABNORMAL HIGH (ref 98–192)

## 2021-03-17 LAB — SAMPLE TO BLOOD BANK

## 2021-03-17 MED ORDER — HEPARIN SOD (PORK) LOCK FLUSH 100 UNIT/ML IV SOLN
500.0000 [IU] | Freq: Once | INTRAVENOUS | Status: AC | PRN
Start: 2021-03-17 — End: 2021-03-17
  Administered 2021-03-17: 500 [IU]
  Filled 2021-03-17: qty 5

## 2021-03-17 MED ORDER — SODIUM CHLORIDE 0.9% FLUSH
10.0000 mL | INTRAVENOUS | Status: DC | PRN
  Administered 2021-03-17: 10 mL
  Filled 2021-03-17: qty 10

## 2021-03-17 NOTE — Patient Instructions (Signed)

## 2021-03-18 ENCOUNTER — Other Ambulatory Visit: Payer: Self-pay | Admitting: Gastroenterology

## 2021-03-18 ENCOUNTER — Other Ambulatory Visit: Payer: Self-pay

## 2021-03-18 DIAGNOSIS — C8510 Unspecified B-cell lymphoma, unspecified site: Secondary | ICD-10-CM

## 2021-03-18 MED ORDER — MIRTAZAPINE 15 MG PO TABS: ORAL_TABLET | ORAL | 1 refills | Status: DC

## 2021-03-18 NOTE — Telephone Encounter (Signed)
This is a patient of Dr. Grier Mitts.

## 2021-03-25 ENCOUNTER — Other Ambulatory Visit: Payer: Self-pay

## 2021-03-25 DIAGNOSIS — C8510 Unspecified B-cell lymphoma, unspecified site: Secondary | ICD-10-CM

## 2021-03-25 MED ORDER — MIRTAZAPINE 15 MG PO TABS: ORAL_TABLET | ORAL | 1 refills | Status: DC

## 2021-03-31 ENCOUNTER — Telehealth: Payer: Self-pay | Admitting: Hematology

## 2021-03-31 NOTE — Telephone Encounter (Signed)
Scheduled appts per 7/27 sch msg. Pt aware.  

## 2021-04-04 NOTE — Progress Notes (Signed)
HEMATOLOGY-ONCOLOGY PROGRESS NOTE   Date of Encounter: 04/05/2021  CHIEF COMPLAINT /PURPOSE OF CONSULTATION: Large B-Cell Lymphoma   HISTORY OF PRESENTING ILLNESS See previous note.    INTERVAL HISTORY  Lindsey Price is a wonderful 75 y.o. 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 The patient's last visit with [place] was on 03/08/2021. The pt reports that she is doing well overall.  The pt reports still having RLQ abodminal discomfort on and off and limited appetite.  The pt is scheduled for PET CT on 04/06/2021.  Lab results today 04/05/2021 of CBC w/diff and CMP reviewed with the patient. Recovering from chemotherapy.  On review of systems, pt report no fevers/chills/night sweats.     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 Biopsy showed invasive and in situ ductal carcinoma, HER-2 equivocal by IHC (2+), negative by FISH (ratio 1.59), ER+ >95%, PR+ 85%, Ki67 10%.    Malignant neoplasm of left breast in female, estrogen receptor positive ([place])  10/12/2020 Initial Diagnosis   Malignant neoplasm of left breast in female, estrogen receptor positive ([place])    10/12/2020 Cancer Staging   Staging form: Breast, AJCC 8th Edition - Clinical stage from 10/12/2020: Stage IA (cT1b, cN0, cM0, G2, ER+, PR+, HER2-) - Signed by Nicholas Lose, MD on 10/15/2020  Stage prefix: Initial diagnosis    11/05/2020 Surgery   Left lumpectomy: Grade 1 IDC, 1.1 cm, intermediate grade DCIS, margins are negative,  lymph node -0/1, ER greater than 95%, PR 85%, HER-2 negative, Ki-67 10%   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[place])  11/11/2020 Initial Diagnosis   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[place])    11/15/2020 -  Chemotherapy    Patient is on Treatment Plan: IP NON-HODGKINS LYMPHOMA EPOCH Q21D   Patient is on Antibody Plan: NON-HODGKINS LYMPHOMA RITUXIMAB Q21D     11/19/2020 -  Chemotherapy    Patient is on Treatment Plan: IP NON-HODGKINS LYMPHOMA EPOCH Q21D   Patient is on Antibody Plan: NON-HODGKINS LYMPHOMA RITUXIMAB Q21D       REVIEW OF SYSTEMS:   10 Point review of Systems was done is negative except as noted above.  PHYSICAL EXAMINATION: ECOG PERFORMANCE STATUS: 1 - Symptomatic but completely ambulatory VS reviewed  NAD GENERAL:alert, in no acute distress and comfortable SKIN: no acute rashes, no significant lesions EYES: conjunctiva are pink and non-injected, sclera anicteric OROPHARYNX: MMM, no exudates, no oropharyngeal erythema or ulceration NECK: supple, no JVD LYMPH:  no palpable lymphadenopathy in the cervical, axillary or inguinal regions LUNGS: clear to auscultation b/l with normal respiratory effort HEART: regular rate & rhythm ABDOMEN:  normoactive bowel sounds , mild TTP RLQ , no rigidity or rebound, not distended. Extremity: no pedal edema PSYCH: alert & oriented x 3 with fluent speech NEURO: no focal motor/sensory deficits    LABORATORY DATA:  I have reviewed the data as listed CMP Latest Ref Rng & Units 04/08/2021 04/05/2021 03/17/2021  Glucose 70 - 99 mg/dL 65(L) 89 83  BUN 8 - 23 mg/dL 13  15 10  Creatinine 0.44 - 1.00 mg/dL 0.53 0.62 0.59  Sodium 135 - 145 mmol/L 138 135 142  Potassium 3.5 - 5.1 mmol/L 3.5 4.1 3.2(L)  Chloride 98 - 111 mmol/L 103 102 107  CO2 22 - 32 mmol/L $RemoveB'23 23 27  'MakVDRJw$ Calcium 8.9 - 10.3 mg/dL 8.4(L) 8.5(L) 8.0(L)  Total Protein 6.5 - 8.1 g/dL 5.5(L) 5.3(L) 5.2(L)  Total Bilirubin 0.3 - 1.2 mg/dL 0.9 0.7 0.4  Alkaline Phos 38 - 126 U/L 82 92 114  AST 15 - 41 U/L  $Re'19 21 22  'oeY$ ALT 0 - 44 U/L $Remo'17 14 24   'pyMFC$ . CBC Latest Ref Rng & Units 04/08/2021 04/05/2021 03/17/2021  WBC 4.0 - 10.5 K/uL 6.5 10.7(H) 14.9(H)  Hemoglobin 12.0 - 15.0 g/dL 11.5(L) 11.7(L) 9.9(L)  Hematocrit 36.0 - 46.0 % 36.1 35.3(L) 30.1(L)  Platelets 150 - 400 K/uL 310 329 218    Lab Results  Component Value Date   WBC 6.5 04/08/2021   HGB 11.5 (L) 04/08/2021   HCT 36.1 04/08/2021   MCV 93.8 04/08/2021   PLT 310 04/08/2021   NEUTROABS 4.1 04/08/2021    No results found.  ASSESSMENT AND PLAN:  1) large B-cell lymphoma -CT abdomen/pelvis with contrast 10/21/2020 - "Acute colitis/enteritis of the cecum/ascending colon and the associated terminal loops of ileum. As was recently described on CT imaging, the anatomy of distal ileum, ileocecal valve, cecum is atypical and correlation with patient's prior surgical record would be useful. The acute inflammatory changes contribute to at least a partial small bowel obstruction. Primary concern of this inflammatory cystic mass/bowel of the right lower quadrant is malignancy given that the posterior margin is inseparable from the psoas muscle, adnexa, and the right ureter. Correlation with CEA may be useful. Inflammatory/infectious changes are also a consideration." -Colonoscopy performed 11/04/2020- "An infiltrative and ulcerated partially obstructing large mass was found at 85 cm proximal to the anus. 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 -Plan is for adjuvant radiation therapy followed by adjuvant antiestrogen therapy after completion of treatment for diffuse large B cell lymphoma   3)  iron deficiency anemia -Labs from 11/15/2020-ferritin 34, iron 24, percent saturation 8%, TIBC 308 -Labs from 11/16/2020-vitamin B12 level 241, folate 7.4   4) history of right breast cancer diagnosed in 2011 status post lumpectomy and 5 years of antiestrogen  therapy   5) depression/anxiety   6) hyperlipidemia   7) hypertension   8) hypothyroidism   9) allergies   10) Iron deficiency   11) B12 deficiency   12) GERD   PLAN:  -Discussed pt labwork today, 04/05/2021; reviewed with patient -Discussed pt PET CT still pending scheduled for 8/3 -Continue to drink at least 2-3 Ensures and supplemental drinks daily in addition to meals. -Advised pt to take the Remeron consistently every night to help with increasing appetite. -Recommended patient avoid large salads and hard/high residue foods. Pasta, rice, lean meat are all great options. Walk 5-10 minutes after main meals. -Recommended pt walk 20-30 minutes daily, eat healthy, sleep well, and drink 48-64 oz water daily. -Continue Vitamin B-Complex. Continue B12 shots.   FOLLOW UP: As per scheduled PET/CT and phone visit.    All of the patients questions were answered with apparent satisfaction. The patient knows to call the clinic with any problems, questions or concerns.    The total time spent in the appointment was 20 minutes  and more than 50% was on counseling and direct patient cares.    Sullivan Lone MD [place] AAHIVMS [facility] Hematology/Oncology Physician [facility]  (Office):       [phone] (Work cell):  [phone] (Fax):           [phone]   I, Reinaldo Raddle, am acting as scribe for Dr. Sullivan Lone, MD  .I have reviewed the above documentation for accuracy and completeness, and I agree with the above. Brunetta Genera MD

## 2021-04-05 ENCOUNTER — Inpatient Hospital Stay: Payer: PPO | Attending: Hematology

## 2021-04-05 ENCOUNTER — Other Ambulatory Visit: Payer: Self-pay

## 2021-04-05 ENCOUNTER — Inpatient Hospital Stay (HOSPITAL_BASED_OUTPATIENT_CLINIC_OR_DEPARTMENT_OTHER): Payer: PPO | Admitting: Hematology

## 2021-04-05 VITALS — BP 139/81 | HR 87 | Temp 97.6°F | Resp 15 | Ht 66.0 in | Wt 112.4 lb

## 2021-04-05 DIAGNOSIS — C8510 Unspecified B-cell lymphoma, unspecified site: Secondary | ICD-10-CM | POA: Diagnosis not present

## 2021-04-05 DIAGNOSIS — C50412 Malignant neoplasm of upper-outer quadrant of left female breast: Secondary | ICD-10-CM

## 2021-04-05 DIAGNOSIS — E538 Deficiency of other specified B group vitamins: Secondary | ICD-10-CM | POA: Insufficient documentation

## 2021-04-05 DIAGNOSIS — D0512 Intraductal carcinoma in situ of left breast: Secondary | ICD-10-CM | POA: Insufficient documentation

## 2021-04-05 DIAGNOSIS — D509 Iron deficiency anemia, unspecified: Secondary | ICD-10-CM | POA: Insufficient documentation

## 2021-04-05 DIAGNOSIS — I10 Essential (primary) hypertension: Secondary | ICD-10-CM | POA: Diagnosis not present

## 2021-04-05 DIAGNOSIS — C8330 Diffuse large B-cell lymphoma, unspecified site: Secondary | ICD-10-CM | POA: Diagnosis not present

## 2021-04-05 DIAGNOSIS — K219 Gastro-esophageal reflux disease without esophagitis: Secondary | ICD-10-CM | POA: Insufficient documentation

## 2021-04-05 DIAGNOSIS — Z170 Estrogen receptor positive status [ER+]: Secondary | ICD-10-CM

## 2021-04-05 DIAGNOSIS — Z79899 Other long term (current) drug therapy: Secondary | ICD-10-CM | POA: Diagnosis not present

## 2021-04-05 DIAGNOSIS — Z95828 Presence of other vascular implants and grafts: Secondary | ICD-10-CM

## 2021-04-05 LAB — CMP (CANCER CENTER ONLY)
ALT: 14 U/L (ref 0–44)
AST: 21 U/L (ref 15–41)
Albumin: 2.8 g/dL — ABNORMAL LOW (ref 3.5–5.0)
Alkaline Phosphatase: 92 U/L (ref 38–126)
Anion gap: 10 (ref 5–15)
BUN: 15 mg/dL (ref 8–23)
CO2: 23 mmol/L (ref 22–32)
Calcium: 8.5 mg/dL — ABNORMAL LOW (ref 8.9–10.3)
Chloride: 102 mmol/L (ref 98–111)
Creatinine: 0.62 mg/dL (ref 0.44–1.00)
GFR, Estimated: >60 mL/min (ref >60)
Glucose, Bld: 89 mg/dL (ref 70–99)
Potassium: 4.1 mmol/L (ref 3.5–5.1)
Sodium: 135 mmol/L (ref 135–145)
Total Bilirubin: 0.7 mg/dL (ref 0.3–1.2)
Total Protein: 5.3 g/dL — ABNORMAL LOW (ref 6.5–8.1)

## 2021-04-05 LAB — CBC WITH DIFFERENTIAL (CANCER CENTER ONLY)
Abs Immature Granulocytes: 0.05 10*3/uL (ref 0.00–0.07)
Basophils Absolute: 0 10*3/uL (ref 0.0–0.1)
Basophils Relative: 0 %
Eosinophils Absolute: 0.1 10*3/uL (ref 0.0–0.5)
Eosinophils Relative: 1 %
HCT: 35.3 % — ABNORMAL LOW (ref 36.0–46.0)
Hemoglobin: 11.7 g/dL — ABNORMAL LOW (ref 12.0–15.0)
Immature Granulocytes: 1 %
Lymphocytes Relative: 13 %
Lymphs Abs: 1.4 10*3/uL (ref 0.7–4.0)
MCH: 29.9 pg (ref 26.0–34.0)
MCHC: 33.1 g/dL (ref 30.0–36.0)
MCV: 90.3 fL (ref 80.0–100.0)
Monocytes Absolute: 1.8 10*3/uL — ABNORMAL HIGH (ref 0.1–1.0)
Monocytes Relative: 17 %
Neutro Abs: 7.3 10*3/uL (ref 1.7–7.7)
Neutrophils Relative %: 68 %
Platelet Count: 329 10*3/uL (ref 150–400)
RBC: 3.91 MIL/uL (ref 3.87–5.11)
RDW: 19.4 % — ABNORMAL HIGH (ref 11.5–15.5)
WBC Count: 10.7 10*3/uL — ABNORMAL HIGH (ref 4.0–10.5)
nRBC: 0 % (ref 0.0–0.2)

## 2021-04-05 LAB — SAMPLE TO BLOOD BANK

## 2021-04-05 MED ORDER — SODIUM CHLORIDE 0.9% FLUSH
10.0000 mL | INTRAVENOUS | Status: DC | PRN
  Administered 2021-04-05: 10 mL
  Filled 2021-04-05: qty 10

## 2021-04-05 MED ORDER — HEPARIN SOD (PORK) LOCK FLUSH 100 UNIT/ML IV SOLN
500.0000 [IU] | Freq: Once | INTRAVENOUS | Status: AC | PRN
Start: 2021-04-05 — End: 2021-04-05
  Administered 2021-04-05: 500 [IU]
  Filled 2021-04-05: qty 5

## 2021-04-06 ENCOUNTER — Ambulatory Visit (HOSPITAL_COMMUNITY)
Admission: RE | Admit: 2021-04-06 | Discharge: 2021-04-06 | Disposition: A | Discharge status: Home / Self Care | Payer: PPO | Source: Ambulatory Visit | Attending: Hematology | Admitting: Hematology

## 2021-04-06 ENCOUNTER — Other Ambulatory Visit: Payer: Self-pay | Admitting: Hematology

## 2021-04-06 DIAGNOSIS — C8330 Diffuse large B-cell lymphoma, unspecified site: Secondary | ICD-10-CM | POA: Diagnosis not present

## 2021-04-06 DIAGNOSIS — C8510 Unspecified B-cell lymphoma, unspecified site: Secondary | ICD-10-CM | POA: Diagnosis not present

## 2021-04-06 LAB — GLUCOSE, CAPILLARY: Glucose-Capillary: 74 mg/dL (ref 70–99)

## 2021-04-06 MED ORDER — FLUDEOXYGLUCOSE F - 18 (FDG) INJECTION
5.8000 | Freq: Once | INTRAVENOUS | Status: AC
  Administered 2021-04-06: 5.8 via INTRAVENOUS

## 2021-04-06 MED ORDER — HYDROCODONE-ACETAMINOPHEN 5-325 MG PO TABS: 1.0000 | ORAL_TABLET | Freq: Four times a day (QID) | ORAL | 0 refills | Status: DC | PRN

## 2021-04-07 NOTE — Progress Notes (Signed)
HEMATOLOGY-ONCOLOGY PROGRESS NOTE   Date of Encounter: 04/08/2021  CHIEF COMPLAINT /PURPOSE OF CONSULTATION: Large B-Cell Lymphoma   HISTORY OF PRESENTING ILLNESS See previous note.    INTERVAL HISTORY I connected with Lindsey Price on 04/08/2021 by telephone and verified that I am speaking with the correct person using two identifiers.   I discussed the limitations of evaluation and management by telemedicine. The patient expressed understanding and agreed to proceed.   Other persons participating in the visit and their role in the encounter:                                                         - Lindsey Price, Medical Scribe     Patient's location: Home Provider's location: [place] at [facility] [employer] is a wonderful 75 y.o. 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 The patient's last visit with [place] was on 03/08/2021. The pt reports that she is doing well overall.  The pt reports she has had some increased pain in the right lower quadrant of the abdomen and still feels very bloated.  We sent in some hydrocodone/acetaminophen prn for her yesterday.  She is still having some loose stools but feels like she is quite backed up. No overt nausea or vomiting at this time but just very uncomfortable in the abdomen.  She notes her abdominal discomfort is keeping her from being able to advance her p.o. intake for weight and strength.  The pt has had PET Skull Base to Thigh on 04/06/2021, which revealed Interval increase in  hypermetabolism associated with the irregular right lower quadrant soft tissue lesion noted on previous exams and consistent with the patient's known small bowel lymphoma. The lesion itself appears similar to mildly progressive on CT imaging. Interval development of peribronchovascular nodular airspace disease in the posterior right upper and right lower lobes  compatible with multifocal pneumonia. FDG accumulation in this region consistent with infection/inflammation. Close follow-up recommended to exclude lymphoma involvement. Persistent marked dilatation of small bowel loops with abrupt transition in the right lower quadrant at the hypermetabolic lesion. Imaging features remain consistent with small bowel obstruction.  Lab results  04/05/2021 of CBC w/diff and CMP is as follows: all values are WNL except for WBC of 10.7K, Hgb of 11.7, HCT of 35.3, RDW of 19.4, Mono Abs of 1.8K, Calcium of 8.5, Total protein of 5.3, Albumin of 2.8.  On review of systems, pt reports weight loss of 11 pounds in the last 6 weeks.  Poor p.o. intake due to abdominal discomfort and distention.  No fevers no chills no night sweats. No new cough.  No hemoptysis.  Currently is   Oncology History  Cancer of right breast, stage 0  09/13/2009 Initial Biopsy   Right breast core needle biopsy: 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 Biopsy showed invasive and in situ ductal carcinoma, HER-2 equivocal by  IHC (2+), negative by FISH (ratio 1.59), ER+ >95%, PR+ 85%, Ki67 10%.    Malignant neoplasm of left breast in female, estrogen receptor positive ([place])  10/12/2020 Initial Diagnosis   Malignant neoplasm of left breast in female, estrogen receptor positive ([place])   10/12/2020 Cancer Staging   Staging form: Breast, AJCC 8th Edition - Clinical stage from 10/12/2020: Stage IA (cT1b, cN0, cM0, G2, ER+, PR+, HER2-) - Signed by Nicholas Lose, MD on 10/15/2020 Stage prefix: Initial diagnosis   11/05/2020 Surgery   Left lumpectomy: Grade 1 IDC, 1.1 cm, intermediate grade  DCIS, margins are negative, lymph node -0/1, ER greater than 95%, PR 85%, HER-2 negative, Ki-67 10%   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[place])  11/11/2020 Initial Diagnosis   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[place])   11/15/2020 -  Chemotherapy    Patient is on Treatment Plan: IP NON-HODGKINS LYMPHOMA EPOCH Q21D   Patient is on Antibody Plan: NON-HODGKINS LYMPHOMA RITUXIMAB Q21D    11/19/2020 -  Chemotherapy    Patient is on Treatment Plan: IP NON-HODGKINS LYMPHOMA EPOCH Q21D   Patient is on Antibody Plan: NON-HODGKINS LYMPHOMA RITUXIMAB Q21D      REVIEW OF SYSTEMS:   10 Point review of Systems was done is negative except as noted above.  PHYSICAL EXAMINATION: ECOG PERFORMANCE STATUS: 1 - Symptomatic but completely ambulatory VS reviewed  Telehealth Visit.    LABORATORY DATA:  I have reviewed the data as listed CMP Latest Ref Rng & Units 04/05/2021 03/17/2021 03/08/2021  Glucose 70 - 99 mg/dL 89 83 96  BUN 8 - 23 mg/dL _0 Creatinine 0.44 - 1.00 mg/dL 0.62 0.59 0.63  Sodium 135 - 145 mmol/L 135 142 139  Potassium 3.5 - 5.1 mmol/L 4.1 3.2(L) 4.0  Chloride 98 - 111 mmol/L 102 107 102  CO2 22 - 32 mmol/L _1 Calcium 8.9 - 10.3 mg/dL 8.5(L) 8.0(L) 9.6  Total Protein 6.5 - 8.1 g/dL 5.3(L) 5.2(L) 5.8(L)  Total Bilirubin 0.3 - 1.2 mg/dL 0.7 0.4 1.2  Alkaline Phos 38 - 126 U/L 92 114 86  AST 15 - 41 U/L _2 ALT 0 - 44 U/L 14 24 42    Lab Results  Component Value Date   WBC 10.7 (H) 04/05/2021   HGB 11.7 (L) 04/05/2021   HCT 35.3 (L) 04/05/2021   MCV 90.3 04/05/2021   PLT 329 04/05/2021   NEUTROABS 7.3 04/05/2021    No results found.  ASSESSMENT AND PLAN:  1) large B-cell lymphoma -CT abdomen/pelvis with contrast 10/21/2020 - "Acute colitis/enteritis of the cecum/ascending colon and the associated terminal loops of ileum. As was recently described on CT imaging, the anatomy of distal ileum, ileocecal valve, cecum is atypical and correlation with patient's prior surgical record  would be useful. The acute inflammatory changes contribute to at least a partial small bowel obstruction. Primary concern of this inflammatory cystic mass/bowel of the right lower quadrant is malignancy given that the posterior margin is inseparable from the psoas muscle, adnexa, and the right ureter. Correlation with CEA may be useful. Inflammatory/infectious changes are also a consideration." -Colonoscopy performed 11/04/2020- "An infiltrative and ulcerated partially obstructing large mass was found at 85 cm proximal to the anus. 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 -Plan is for adjuvant radiation therapy followed by adjuvant antiestrogen therapy after completion of treatment for diffuse large B cell lymphoma  3)  iron deficiency anemia -Labs from 11/15/2020-ferritin 34, iron 24, percent saturation 8%, TIBC 308 -Labs from 11/16/2020-vitamin B12 level 241, folate 7.4   4) history of right breast cancer diagnosed in 2011 status post lumpectomy and 5 years of antiestrogen therapy   5) depression/anxiety   6) hyperlipidemia   7) hypertension   8) hypothyroidism   9) allergies   10) Iron deficiency   11) B12 deficiency   12) GERD   PLAN:  -Discussed pt labwork today, 04/05/2021; anemia is resolved she has some borderline leukocytosis 10.7k and normal platelets. CMP unremarkable except for hypoalbuminemia. -Discussed pt PET CT -still shows FDG avid mass in the right lower quadrant which is causing high-grade small bowel obstruction and obstructive symptomatology.  We discussed that there is residual FDG avid mass could represent residual lymphoma versus necrotic tissue versus inflammation in the bowel. -We typically would need to resample this mass to determine if there is residual/progressive lymphoma. -Given her significant obstructive symptomatology and abdominal discomfort  the best approach would likely be consulting GI surgery for resection of the mass to address her obstructive symptoms and to also answer the question regarding the presence of residual lymphoma versus necrotic versus inflammatory mass. -Just doing a colonoscopy and biopsy will not address her obstructive symptoms. -We discussed that if there is some risk for lymphoma in the single area after completion of chemoimmunotherapy we consider consolidative I-S RT radiation which will be difficult considering her complex bowel anatomy with scar tissue current obstructive findings on radiography with symptomatology. -She also seems to have a possible pneumonia on her PET CT scan and we sent in a prescription for levofloxacin for management of her pneumonia. -She was advised to go to the emergency room for further evaluation and management of her small bowel obstruction and pneumonia. -Would recommend consulting surgery for consideration of resection of her residual mass and addressing her small bowel obstruction. -Her counts have bounced back after her chemotherapy and she would be okay to proceed with surgery. -Continue Vitamin B-Complex. Continue B12 shots. -She was recommended to pursue a clear liquid diet for now -Further oncologic management based on findings from surgery  FOLLOW UP: Patient was recommended to go to the emergency room for further evaluation and management of her small bowel obstruction, urgent surgical consultation and management of pneumonia.      All of the patients questions were answered with apparent satisfaction. The patient knows to call the clinic with any problems, questions or concerns.    The total time spent in the appointment was 30 minutes and more than 50% was on counseling and direct patient cares.    Sullivan Lone MD [place] AAHIVMS [facility] Hematology/Oncology Physician [facility]  (Office):       [phone] (Work cell):  [phone] (Fax):            [phone]   I, Lindsey Price, am acting as scribe for Dr. Sullivan Lone, MD .I have reviewed the above documentation for accuracy and completeness, and I agree with the above. Brunetta Genera MD

## 2021-04-08 ENCOUNTER — Other Ambulatory Visit: Payer: Self-pay

## 2021-04-08 ENCOUNTER — Encounter (HOSPITAL_COMMUNITY): Payer: Self-pay

## 2021-04-08 ENCOUNTER — Emergency Department (HOSPITAL_COMMUNITY)
Admission: EM | Admit: 2021-04-08 | Discharge: 2021-04-08 | Disposition: A | Discharge status: Home / Self Care | Payer: PPO | Attending: Emergency Medicine | Admitting: Emergency Medicine

## 2021-04-08 ENCOUNTER — Inpatient Hospital Stay (HOSPITAL_BASED_OUTPATIENT_CLINIC_OR_DEPARTMENT_OTHER): Payer: PPO | Admitting: Hematology

## 2021-04-08 DIAGNOSIS — J189 Pneumonia, unspecified organism: Secondary | ICD-10-CM | POA: Diagnosis not present

## 2021-04-08 DIAGNOSIS — K56609 Unspecified intestinal obstruction, unspecified as to partial versus complete obstruction: Secondary | ICD-10-CM | POA: Diagnosis not present

## 2021-04-08 DIAGNOSIS — Z20822 Contact with and (suspected) exposure to covid-19: Secondary | ICD-10-CM | POA: Insufficient documentation

## 2021-04-08 DIAGNOSIS — J45909 Unspecified asthma, uncomplicated: Secondary | ICD-10-CM | POA: Insufficient documentation

## 2021-04-08 DIAGNOSIS — C8510 Unspecified B-cell lymphoma, unspecified site: Secondary | ICD-10-CM | POA: Diagnosis not present

## 2021-04-08 LAB — CBC WITH DIFFERENTIAL/PLATELET
Abs Immature Granulocytes: 0.08 10*3/uL — ABNORMAL HIGH (ref 0.00–0.07)
Basophils Absolute: 0 10*3/uL (ref 0.0–0.1)
Basophils Relative: 0 %
Eosinophils Absolute: 0 10*3/uL (ref 0.0–0.5)
Eosinophils Relative: 1 %
HCT: 36.1 % (ref 36.0–46.0)
Hemoglobin: 11.5 g/dL — ABNORMAL LOW (ref 12.0–15.0)
Immature Granulocytes: 1 %
Lymphocytes Relative: 22 %
Lymphs Abs: 1.4 10*3/uL (ref 0.7–4.0)
MCH: 29.9 pg (ref 26.0–34.0)
MCHC: 31.9 g/dL (ref 30.0–36.0)
MCV: 93.8 fL (ref 80.0–100.0)
Monocytes Absolute: 0.9 10*3/uL (ref 0.1–1.0)
Monocytes Relative: 13 %
Neutro Abs: 4.1 10*3/uL (ref 1.7–7.7)
Neutrophils Relative %: 63 %
Platelets: 310 10*3/uL (ref 150–400)
RBC: 3.85 MIL/uL — ABNORMAL LOW (ref 3.87–5.11)
RDW: 18.7 % — ABNORMAL HIGH (ref 11.5–15.5)
WBC: 6.5 10*3/uL (ref 4.0–10.5)
nRBC: 0 % (ref 0.0–0.2)

## 2021-04-08 LAB — URINALYSIS, ROUTINE W REFLEX MICROSCOPIC
Bilirubin Urine: NEGATIVE
Glucose, UA: NEGATIVE mg/dL
Hgb urine dipstick: NEGATIVE
Ketones, ur: 20 mg/dL — AB
Nitrite: NEGATIVE
Protein, ur: 30 mg/dL — AB
Specific Gravity, Urine: 1.026 (ref 1.005–1.030)
pH: 5 (ref 5.0–8.0)

## 2021-04-08 LAB — RESP PANEL BY RT-PCR (FLU A&B, COVID) ARPGX2
Influenza A by PCR: NEGATIVE
Influenza B by PCR: NEGATIVE
SARS Coronavirus 2 by RT PCR: NEGATIVE

## 2021-04-08 LAB — COMPREHENSIVE METABOLIC PANEL
ALT: 17 U/L (ref 0–44)
AST: 19 U/L (ref 15–41)
Albumin: 3.1 g/dL — ABNORMAL LOW (ref 3.5–5.0)
Alkaline Phosphatase: 82 U/L (ref 38–126)
Anion gap: 12 (ref 5–15)
BUN: 13 mg/dL (ref 8–23)
CO2: 23 mmol/L (ref 22–32)
Calcium: 8.4 mg/dL — ABNORMAL LOW (ref 8.9–10.3)
Chloride: 103 mmol/L (ref 98–111)
Creatinine, Ser: 0.53 mg/dL (ref 0.44–1.00)
GFR, Estimated: >60 mL/min (ref >60)
Glucose, Bld: 65 mg/dL — ABNORMAL LOW (ref 70–99)
Potassium: 3.5 mmol/L (ref 3.5–5.1)
Sodium: 138 mmol/L (ref 135–145)
Total Bilirubin: 0.9 mg/dL (ref 0.3–1.2)
Total Protein: 5.5 g/dL — ABNORMAL LOW (ref 6.5–8.1)

## 2021-04-08 LAB — LACTIC ACID, PLASMA: Lactic Acid, Venous: 0.7 mmol/L (ref 0.5–1.9)

## 2021-04-08 MED ORDER — LEVOFLOXACIN 500 MG PO TABS: 500.0000 mg | ORAL_TABLET | Freq: Every day | ORAL | 0 refills | Status: DC

## 2021-04-08 NOTE — ED Provider Notes (Signed)
[facility] DEPT Provider Note   CSN: [id number] Arrival date & time: 04/08/21  1150     History Chief Complaint  Patient presents with   ABNORMAL RESULTS    Lindsey Price is a 75 y.o. female with  history of right breast cancer status post lumpectomy in 2011 followed by 5 years of antiestrogen therapy, ER/PR positive, HER-2 negative left breast cancer status post left lumpectomy 11/05/2020, recent diagnosis of diffuse large B-cell lymphoma hyperlipidemia, allergies, anemia, anxiety, asthma, depression, hypertension, hypothyroidism that presents to the ED for abnormal PET scan which shows multifocal pneumonia and persistent dilation of small bowel loops with abrupt transition int he right lower quadrant consistent with SBO. Pt states that for the past couple of days she has had persistent nausea and vomiting and diarrhea, however states that over the past day she has started to feel better, was able to eat a meal yesterday and keep it down. Did take nausea medicine today and does feel better. Denies any vomiting today. Wants to eat now. Does still admit to diarrhea. No abomdinal pain. In regards to PNA, pt denies any SOB, CP, fevers, cough or URI symptoms. Pt follows Dr. Irene Limbo  for oncology who told her to come to ED. No other complaints at this time.   HPI     Past Medical History:  Diagnosis Date   Allergy    Anemia    Anxiety    Arthralgia 09/11/2011   Asthma    cough variant asthma   Breast cancer ([place]) 2011   RIGHT BREAST CA   Breast cancer, left ([place]) 09/2020   left breast IDC   Cataract    DCIS (ductal carcinoma in situ) of breast 2011   DCIS   Depression    GERD (gastroesophageal reflux disease)    Hyperlipidemia    Hypertension    Hypothyroidism    Insomnia    Thyroid disease    hypothyroidism    Patient Active Problem List   Diagnosis Date Noted   Neutropenic fever ([place]) 02/18/2021   Symptomatic anemia    Port-A-Cath in place  12/24/2020   B12 deficiency    Iron deficiency anemia due to chronic blood loss    Encounter for antineoplastic chemotherapy 11/15/2020   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[place]) 11/11/2020   Malignant neoplasm of left breast in female, estrogen receptor positive ([place]) 10/12/2020   Colitis, acute 10/01/2020   Hoarseness, chronic 05/07/2020   Flank pain 09/09/2019   Insulin resistance/ pre-DM 08/21/2019   Benign polyp of large intestine 08/21/2019   Educated about COVID-19 virus infection 04/16/2019   Chronic left hip pain 04/16/2019   Piriformis muscle pain 04/16/2019   Contact dermatitis 09/30/2018   Traumatic complete tear of right rotator cuff 09/30/2018   Chronic right shoulder pain 09/30/2018   Muscle cramp, nocturnal 08/08/2018   Low HDL (under 40) 04/04/2018   Reactive airway disease 12/17/2017   Vitamin D deficiency 04/12/2017   Osteopenia 04/12/2017   HTN (hypertension) 03/12/2017   Hyperlipemia, mixed 03/12/2017   Cataracts, bilateral- s/p surgery. 03/12/2017   Hypothyroidism 03/12/2017   Environmental and seasonal allergies 03/12/2017   Adjustment disorder 03/12/2017   GERD (gastroesophageal reflux disease) 03/12/2017   Family history of multiple cancers 03/12/2017   History of tobacco abuse 03/12/2017   Allergic rhinitis 03/23/2015   Cough variant asthma 02/23/2015   Cancer of right breast, stage 0 09/11/2011   Insomnia 09/11/2011   Arthralgia 09/11/2011    Past Surgical History:  Procedure Laterality Date   ABDOMINAL HYSTERECTOMY  1975   partial   BREAST BIOPSY Right 09/10/2013   BENIGN   BREAST BIOPSY Left 09/07/2011   BENIGN   BREAST LUMPECTOMY Right 2011   BREAST LUMPECTOMY WITH RADIOACTIVE SEED AND SENTINEL LYMPH NODE BIOPSY Left 11/05/2020   Procedure: LEFT BREAST LUMPECTOMY WITH RADIOACTIVE SEED AND SENTINEL LYMPH NODE BIOPSY;  Surgeon: Griselda Miner, MD;  Location: [facility];  Service: General;  Laterality: Left;   CATARACT EXTRACTION, BILATERAL      COLONOSCOPY  2015   COLONOSCOPY WITH PROPOFOL N/A 04/14/2014   Procedure: COLONOSCOPY WITH PROPOFOL;  Surgeon: Charolett Bumpers, MD;  Location: [facility];  Service: Endoscopy;  Laterality: N/A;   IR IMAGING GUIDED PORT INSERTION  12/01/2020   POLYPECTOMY     ROTATOR CUFF REPAIR Left 04/2013   Dr. Wyline Mood     OB History   No obstetric history on file.     Family History  Problem Relation Age of Onset   Cancer Mother        BREAST   Cancer Father        pancreatic   Cancer Sister        sebacous cell carcinoma   Cancer Maternal Aunt        lung   Cancer Paternal Uncle        colon, stomach   Colon cancer Neg Hx    Colon polyps Neg Hx    Esophageal cancer Neg Hx    Rectal cancer Neg Hx    Stomach cancer Neg Hx     Social History   Tobacco Use   Smoking status: Former    Packs/day: 1.00    Years: 15.00    Pack years: 15.00    Types: Cigarettes    Quit date: 12/09/1978    Years since quitting: 42.3   Smokeless tobacco: Never  Vaping Use   Vaping Use: Never used  Substance Use Topics   Alcohol use: Yes    Alcohol/week: 7.0 standard drinks    Types: 7 Glasses of wine per week    Comment: SOCIALLY wine   Drug use: No    Home Medications Prior to Admission medications   Medication Sig Start Date End Date Taking? Authorizing Provider  albuterol (PROAIR HFA) 108 (90 Base) MCG/ACT inhaler Inhale 1-2 puffs into the lungs every 6 (six) hours as needed for wheezing or shortness of breath. 12/10/20   Myrtis Ser, NP  alum & mag hydroxide-simeth (MAALOX/MYLANTA) 200-200-20 MG/5ML suspension Take 30 mLs by mouth every 4 (four) hours as needed for indigestion. 11/19/20   Myrtis Ser, NP  atorvastatin (LIPITOR) 10 MG tablet Take 1 tablet (10 mg total) by mouth at bedtime. 11/12/19   Opalski, Deborah, DO  B Complex Vitamins (B COMPLEX PO) Take 1 tablet by mouth daily with breakfast.    [provider]  calcium carbonate (OS-CAL - DOSED IN MG OF ELEMENTAL  CALCIUM) 1250 (500 Ca) MG tablet Take 1,250 mg by mouth daily.    [provider]  cyanocobalamin (,VITAMIN B-12,) 1000 MCG/ML injection Inject 1 mL (1,000 mcg total) into the skin every 30 (thirty) days. 12/16/20   Myrtis Ser, NP  dexamethasone (DECADRON) 4 MG tablet Take 2 tablets (8 mg total) by mouth See admin instructions. Take 8 mg by mouth two times a day with a meal starting the day after chemotherapy- for 3 days 02/15/21   Myrtis Ser, NP  ezetimibe (ZETIA) 10 MG tablet Take 1 tablet (10 mg total) by mouth at bedtime. 02/15/21   Maryanna Shape, NP  famotidine (PEPCID) 20 MG tablet TAKE 1 TABLET (20 MG TOTAL) BY MOUTH 2 (TWO) TIMES DAILY AS NEEDED FOR HEARTBURN OR INDIGESTION. 03/11/21   Mauri Pole, MD  feeding supplement (ENSURE ENLIVE / ENSURE PLUS) LIQD Take 237 mLs by mouth 2 (two) times daily between meals. 11/19/20   Maryanna Shape, NP  FLOVENT HFA 44 MCG/ACT inhaler Inhale 2 puffs into the lungs daily as needed (shortness of breath).    [provider]  fluticasone (FLONASE) 50 MCG/ACT nasal spray Place 2 sprays into both nostrils daily. 02/15/21   Maryanna Shape, NP  HYDROcodone-acetaminophen (NORCO/VICODIN) 5-325 MG tablet Take 1-2 tablets by mouth every 6 (six) hours as needed for moderate pain or severe pain. 04/06/21   Brunetta Genera, MD  levocetirizine (XYZAL) 5 MG tablet Take 5 mg by mouth daily.    [provider]  levofloxacin (LEVAQUIN) 500 MG tablet Take 1 tablet (500 mg total) by mouth daily. 04/08/21   Brunetta Genera, MD  levothyroxine (SYNTHROID) 100 MCG tablet Take 1 tablet (100 mcg total) by mouth every other day. 02/24/21   Regalado, Belkys A, MD  levothyroxine (SYNTHROID) 88 MCG tablet Take 1 tablet (88 mcg total) by mouth every other day. 02/23/21   Regalado, Belkys A, MD  lidocaine-prilocaine (EMLA) cream Apply 1 application topically as needed (as directed). 02/15/21   Maryanna Shape, NP  LORazepam (ATIVAN)  0.5 MG tablet Take 1 tablet (0.5 mg total) by mouth See admin instructions. Take 0.5 mg by mouth at bedtime nightly while hospitalized 02/15/21   Maryanna Shape, NP  magic mouthwash w/lidocaine SOLN Take 5 mLs by mouth 4 (four) times daily as needed for mouth pain. Swish and swallow 1 part diphenhydramine 12.5 mg per 5 mL elixir (30ml), 1 part Maalox (do not substitute Kaopectate) 34ml, 1 part 2% viscous lidocaine (40 ml), 1 part nystatin 500000 units/ml (3ml) and 1 part distilled water (25ml). Plz dispense 200 ml 12/16/20   Brunetta Genera, MD  magnesium oxide (MAG-OX) 400 (240 Mg) MG tablet Take 1 tablet (400 mg total) by mouth daily. 03/04/21   Maryanna Shape, NP  mirtazapine (REMERON) 15 MG tablet TAKE 1 TABLET BY MOUTH EVERYDAY AT BEDTIME 03/25/21   Brunetta Genera, MD  montelukast (SINGULAIR) 10 MG tablet Take 1 tablet (10 mg total) by mouth daily. 02/15/21   Maryanna Shape, NP  Multiple Vitamin (MULTIVITAMIN WITH MINERALS) TABS tablet Take 1 tablet by mouth daily.    [provider]  NEXIUM 20 MG capsule Take 20 mg by mouth 2 (two) times daily before a meal.    [provider]  ondansetron (ZOFRAN) 8 MG tablet Take 1 tablet (8 mg total) by mouth See admin instructions. Take 8 mg by mouth two times a day starting the day after chemo for- 3 days, then, as needed, two times a day as needed for nausea or vomiting 02/15/21   Maryanna Shape, NP  polyethylene glycol (MIRALAX / GLYCOLAX) 17 g packet Take 17 g by mouth daily. 11/19/20   Maryanna Shape, NP  prochlorperazine (COMPAZINE) 10 MG tablet Take 1 tablet (10 mg total) by mouth every 6 (six) hours as needed for vomiting or nausea. 02/15/21   Maryanna Shape, NP  Pseudoephedrine-Ibuprofen (ADVIL COLD/SINUS PO) Take 1 tablet by mouth daily as needed (headaches).  [provider]  senna-docusate (SENOKOT-S) 8.6-50 MG tablet Take 1 tablet by mouth 2 (two) times daily as needed for mild constipation. 03/04/21    Maryanna Shape, NP  Sodium Chloride-Sodium Bicarb (SODIUM BICARBONATE/SODIUM CHLORIDE) SOLN 1 application by Mouth Rinse route See admin instructions. Use as directed three to four times daily 02/15/21   Maryanna Shape, NP  venlafaxine XR (EFFEXOR-XR) 37.5 MG 24 hr capsule Take 1 capsule (37.5 mg total) by mouth daily with breakfast. 02/15/21   Maryanna Shape, NP  Vitamin D, Ergocalciferol, (DRISDOL) 1.25 MG (50000 UNIT) CAPS capsule Take 1 capsule (50,000 Units total) by mouth every Friday. 02/15/21   Maryanna Shape, NP  prochlorperazine (COMPAZINE) 25 MG suppository Place 1 suppository (25 mg total) rectally every 12 (twelve) hours as needed for nausea. Patient not taking: Reported on 02/18/2021 11/18/20 02/18/21  Nicholas Lose, MD    Allergies    Advair diskus [fluticasone-salmeterol] and Neosporin [neomycin-polymyxin-gramicidin]  Review of Systems   Review of Systems  Constitutional:  Negative for chills, diaphoresis, fatigue and fever.  HENT:  Negative for congestion, sore throat and trouble swallowing.   Eyes:  Negative for pain and visual disturbance.  Respiratory:  Negative for cough, shortness of breath and wheezing.   Cardiovascular:  Negative for chest pain, palpitations and leg swelling.  Gastrointestinal:  Positive for diarrhea and nausea. Negative for abdominal distention, abdominal pain and vomiting.  Genitourinary:  Negative for difficulty urinating.  Musculoskeletal:  Negative for back pain, neck pain and neck stiffness.  Skin:  Negative for pallor.  Neurological:  Negative for dizziness, speech difficulty, weakness and headaches.  Psychiatric/Behavioral:  Negative for confusion.    Physical Exam Updated Vital Signs BP 121/67   Pulse 87   Temp 97.6 F (36.4 C) (Oral)   Resp 16   Ht $R'5\' 6"'RG$  (1.676 m)   Wt 50 kg   SpO2 98%   BMI 17.79 kg/m   Physical Exam Constitutional:      General: She is not in acute distress.    Appearance: Normal appearance. She is  not ill-appearing, toxic-appearing or diaphoretic.  HENT:     Mouth/Throat:     Mouth: Mucous membranes are moist.     Pharynx: Oropharynx is clear.  Eyes:     General: No scleral icterus.    Extraocular Movements: Extraocular movements intact.     Pupils: Pupils are equal, round, and reactive to light.  Cardiovascular:     Rate and Rhythm: Normal rate and regular rhythm.     Pulses: Normal pulses.     Heart sounds: Normal heart sounds.  Pulmonary:     Effort: Pulmonary effort is normal. No respiratory distress.     Breath sounds: Normal breath sounds. No stridor. No wheezing, rhonchi or rales.  Chest:     Chest wall: No tenderness.  Abdominal:     General: Abdomen is flat. There is no distension.     Palpations: Abdomen is soft.     Tenderness: There is no abdominal tenderness. There is no guarding or rebound.  Musculoskeletal:        General: No swelling or tenderness. Normal range of motion.     Cervical back: Normal range of motion and neck supple. No rigidity.     Right lower leg: No edema.     Left lower leg: No edema.  Skin:    General: Skin is warm and dry.     Capillary Refill: Capillary refill takes less than 2  seconds.     Coloration: Skin is not pale.  Neurological:     General: No focal deficit present.     Mental Status: She is alert and oriented to person, place, and time.  Psychiatric:        Mood and Affect: Mood normal.        Behavior: Behavior normal.    ED Results / Procedures / Treatments   Labs (all labs ordered are listed, but only abnormal results are displayed) Labs Reviewed  COMPREHENSIVE METABOLIC PANEL - Abnormal; Notable for the following components:      Result Value   Glucose, Bld 65 (*)    Calcium 8.4 (*)    Total Protein 5.5 (*)    Albumin 3.1 (*)    All other components within normal limits  CBC WITH DIFFERENTIAL/PLATELET - Abnormal; Notable for the following components:   RBC 3.85 (*)    Hemoglobin 11.5 (*)    RDW 18.7 (*)     Abs Immature Granulocytes 0.08 (*)    All other components within normal limits  URINALYSIS, ROUTINE W REFLEX MICROSCOPIC - Abnormal; Notable for the following components:   Color, Urine AMBER (*)    APPearance CLOUDY (*)    Ketones, ur 20 (*)    Protein, ur 30 (*)    Leukocytes,Ua TRACE (*)    Bacteria, UA MANY (*)    Non Squamous Epithelial 0-5 (*)    All other components within normal limits  RESP PANEL BY RT-PCR (FLU A&B, COVID) ARPGX2  LACTIC ACID, PLASMA  LACTIC ACID, PLASMA    EKG None  Radiology No results found.  Procedures Procedures   Medications Ordered in ED Medications - No data to display  ED Course  I have reviewed the triage vital signs and the nursing notes.  Pertinent labs & imaging results that were available during my care of the patient were reviewed by me and considered in my medical decision making (see chart for details).    MDM Rules/Calculators/A&P                           Pt presents to the ED for PET scan that was done today showing SBO and multifocal PNA. Pt appears very well, no clinical signs of PNA and pt states that her SBO symptoms are resolving. Pt appears comfortable now without any abdmominal pain, however will obtain labs and then consult surgery.   General surgery evaluated pt, see note. Surgery does not think that pt needs admission at this time since this is a chronic problem. Has had SBO in past Cts.Kathrynn Humble spoke to Dr. Irene Limbo who wanted pt admitted, however outpatient surgery also an option.  Did offer admission to pt, however pt declined, wants outpatient surgery.  Pt is already on Levaquin (day 1) for multifocal PNA. Pt will be discharged. CBC and CMP mostly unremarkable. Glucose 65, pt given juice, normal mentation. Hemoglobin at baseline. Urinalysis with potential signs of UTI/dehydration, no urinary symptoms. Pt is already on Levaquin. Pt wanting to be discharged, COVID pending.   Doubt need for further emergent work up at  this time. I explained the diagnosis and have given explicit precautions to return to the ER including for any other new or worsening symptoms. The patient understands and accepts the medical plan as it's been dictated and I have answered their questions. Discharge instructions concerning home care and prescriptions have been given. The patient is STABLE and is  discharged to home in good condition.  I discussed this case with my attending physician who cosigned this note including patient's presenting symptoms, physical exam, and planned diagnostics and interventions. Attending physician stated agreement with plan or made changes to plan which were implemented.   Attending physician assessed patient at bedside.  Final Clinical Impression(s) / ED Diagnoses Final diagnoses:  SBO (small bowel obstruction) ([place])  Multifocal pneumonia    Rx / DC Orders ED Discharge Orders     None        [name], PA-C 04/08/21 2016    Varney Biles, MD 04/16/21 [phone]

## 2021-04-08 NOTE — Progress Notes (Signed)
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  The patient ate steak and potato last night for dinner with no issues.  She is passing flatus and having BMs.  She will occasionally get crampy abdominal discomfort that resolves many times after moving her bowels.  She did have one episode of emesis earlier this week but none since then and feels great.  She hasn't eaten all day and is hungry.  She was surprised to be called and told to come to the ED.  She is currently asymptomatic and would prefer not to be admitted which I think it reasonable give her scan is stable and no worse than her one from June.  We discussed that if she were to develop nausea, she can decrease her diet to liquids/full liquids to see if it improves and resolves.  If her symptoms progress and she has N/V and can't keep anything down then that is concerning for progression towards complete obstruction.  We will arrange for her to see a surgeon in the office for evaluation to discuss possible surgical intervention etc.  She likes this plan and is happy that she is going home.  Lindsey Price 4:56 PM 04/08/2021

## 2021-04-08 NOTE — Discharge Instructions (Addendum)
You should be receiving a call from the surgery team to schedule an appointment for an outpatient surgery as we discussed. Stick to a clear liquid diet and then transition to  a soft diet for the next two days. Continue taking your Levaquin. Please follow up with your PCP to have your urine rechecked.   Please return to the Emergency Department if you experience any worsening of your condition.  Thank you for allowing [place] to be a part of your care. Please speak to your pharmacist about any new medications prescribed today in regards to side effects or interactions with other medications.

## 2021-04-08 NOTE — ED Provider Notes (Signed)
Emergency Medicine Provider Triage Evaluation Note  Lindsey Price , a 75 y.o. female  was evaluated in triage.  Pt complains of abnormal PET scan. PET shows SBO and multifocal PNA. Pt denies cough, fever. No vomiting, does admit to some diarrhea. Did vomit three days ago.   Review of Systems  Positive: diarrhea Negative: Cough, fever  Physical Exam  BP 113/86 (BP Location: Left Arm)   Pulse (!) 108   Resp 15   Ht '5\' 6"'$  (XX123456 m)   Wt 50 kg   SpO2 100%   BMI 17.79 kg/m  Gen:   Awake, no distress   Resp:  Normal effort  MSK:   Moves extremities without difficulty  Other:    Medical Decision Making  Medically screening exam initiated at 2:07 PM.  Appropriate orders placed.  Lindsey Price was informed that the remainder of the evaluation will be completed by another provider, this initial triage assessment does not replace that evaluation, and the importance of remaining in the ED until their evaluation is complete.     Lindsey Client, PA-C 04/08/21 Price, Ankit, MD 04/12/21 1339

## 2021-04-08 NOTE — ED Triage Notes (Signed)
Per patient oncology called her to come to ED for surgery on abd after results of PET scan. Hx of lymphoma/breast cancer

## 2021-04-11 ENCOUNTER — Telehealth: Payer: Self-pay | Admitting: *Deleted

## 2021-04-11 NOTE — Telephone Encounter (Signed)
Patient sent to ED by Dr. Irene Limbo 04/08/21.  Per patient, was seen by surgical PA and discharged home with plan to schedule appt at [facility]. Per ED note, patient was offered admission and declined as she wanted outpatient surgery.   She states she was advised by surgical PA that appt in their office would be scheduled in 1-3 weeks depending on their availability.  Surgery Center is to contact her for appt. She has not heard from them yet. She stated she plans to call them Tuesday.  She is taking the antibiotic prescribed for pneumonia and states she is feeling better.  Patient wanted Dr. Irene Limbo to know she may seek second opinion with Hem/Onc in [place] as her son lives next door to head of Hem/Onc at [facility] at [employer].   Patient asked what Dr. Irene Limbo would want her to do now since she isn't having surgery right away?    She was informed that message will be sent to Dr. Lorenso Courier in Dr. Grier Mitts absence. Informed patient that Dr. Irene Limbo will receive this message on his return - he is currently out of office.  She verbalized understanding of all information.  Encouraged her to contact office for any concerns or symptoms

## 2021-04-12 ENCOUNTER — Encounter: Payer: Self-pay | Admitting: Hematology

## 2021-04-12 ENCOUNTER — Encounter: Payer: Self-pay | Admitting: Hematology and Oncology

## 2021-04-12 NOTE — Telephone Encounter (Signed)
Communicated response from Dr Lorenso Courier to the patient.  No further questions at this time.

## 2021-04-15 DIAGNOSIS — Z800 Family history of malignant neoplasm of digestive organs: Secondary | ICD-10-CM | POA: Diagnosis not present

## 2021-04-15 DIAGNOSIS — M7989 Other specified soft tissue disorders: Secondary | ICD-10-CM | POA: Diagnosis not present

## 2021-04-15 DIAGNOSIS — Z87891 Personal history of nicotine dependence: Secondary | ICD-10-CM | POA: Diagnosis not present

## 2021-04-15 DIAGNOSIS — Z853 Personal history of malignant neoplasm of breast: Secondary | ICD-10-CM | POA: Diagnosis not present

## 2021-04-15 DIAGNOSIS — C8599 Non-Hodgkin lymphoma, unspecified, extranodal and solid organ sites: Secondary | ICD-10-CM | POA: Diagnosis not present

## 2021-04-15 DIAGNOSIS — E46 Unspecified protein-calorie malnutrition: Secondary | ICD-10-CM | POA: Diagnosis not present

## 2021-04-15 DIAGNOSIS — C8339 Diffuse large B-cell lymphoma, extranodal and solid organ sites: Secondary | ICD-10-CM | POA: Diagnosis not present

## 2021-04-15 DIAGNOSIS — Z7689 Persons encountering health services in other specified circumstances: Secondary | ICD-10-CM | POA: Diagnosis not present

## 2021-04-15 DIAGNOSIS — I700 Atherosclerosis of aorta: Secondary | ICD-10-CM | POA: Diagnosis not present

## 2021-04-15 DIAGNOSIS — Z452 Encounter for adjustment and management of vascular access device: Secondary | ICD-10-CM | POA: Diagnosis not present

## 2021-04-15 DIAGNOSIS — Z803 Family history of malignant neoplasm of breast: Secondary | ICD-10-CM | POA: Diagnosis not present

## 2021-04-15 DIAGNOSIS — Z90710 Acquired absence of both cervix and uterus: Secondary | ICD-10-CM | POA: Diagnosis not present

## 2021-04-15 DIAGNOSIS — K56609 Unspecified intestinal obstruction, unspecified as to partial versus complete obstruction: Secondary | ICD-10-CM | POA: Diagnosis not present

## 2021-04-15 DIAGNOSIS — R110 Nausea: Secondary | ICD-10-CM | POA: Diagnosis not present

## 2021-04-15 DIAGNOSIS — C8590 Non-Hodgkin lymphoma, unspecified, unspecified site: Secondary | ICD-10-CM | POA: Diagnosis not present

## 2021-04-15 DIAGNOSIS — N289 Disorder of kidney and ureter, unspecified: Secondary | ICD-10-CM | POA: Diagnosis not present

## 2021-04-15 DIAGNOSIS — Z681 Body mass index (BMI) 19 or less, adult: Secondary | ICD-10-CM | POA: Diagnosis not present

## 2021-04-15 DIAGNOSIS — R918 Other nonspecific abnormal finding of lung field: Secondary | ICD-10-CM | POA: Diagnosis not present

## 2021-04-15 DIAGNOSIS — M25559 Pain in unspecified hip: Secondary | ICD-10-CM | POA: Diagnosis not present

## 2021-04-15 DIAGNOSIS — Z01818 Encounter for other preprocedural examination: Secondary | ICD-10-CM | POA: Diagnosis not present

## 2021-04-15 DIAGNOSIS — Z8572 Personal history of non-Hodgkin lymphomas: Secondary | ICD-10-CM | POA: Diagnosis not present

## 2021-04-15 DIAGNOSIS — K631 Perforation of intestine (nontraumatic): Secondary | ICD-10-CM | POA: Diagnosis not present

## 2021-04-15 DIAGNOSIS — Z840 Family history of diseases of the skin and subcutaneous tissue: Secondary | ICD-10-CM | POA: Diagnosis not present

## 2021-04-15 DIAGNOSIS — K56600 Partial intestinal obstruction, unspecified as to cause: Secondary | ICD-10-CM | POA: Diagnosis not present

## 2021-04-15 DIAGNOSIS — Z79899 Other long term (current) drug therapy: Secondary | ICD-10-CM | POA: Diagnosis not present

## 2021-04-15 DIAGNOSIS — Z5331 Laparoscopic surgical procedure converted to open procedure: Secondary | ICD-10-CM | POA: Diagnosis not present

## 2021-04-15 DIAGNOSIS — K5651 Intestinal adhesions [bands], with partial obstruction: Secondary | ICD-10-CM | POA: Diagnosis not present

## 2021-04-15 DIAGNOSIS — Z9221 Personal history of antineoplastic chemotherapy: Secondary | ICD-10-CM | POA: Diagnosis not present

## 2021-04-15 DIAGNOSIS — K565 Intestinal adhesions [bands], unspecified as to partial versus complete obstruction: Secondary | ICD-10-CM | POA: Diagnosis not present

## 2021-04-15 DIAGNOSIS — Q625 Duplication of ureter: Secondary | ICD-10-CM | POA: Diagnosis not present

## 2021-04-15 DIAGNOSIS — K651 Peritoneal abscess: Secondary | ICD-10-CM | POA: Diagnosis not present

## 2021-04-15 DIAGNOSIS — R633 Feeding difficulties, unspecified: Secondary | ICD-10-CM | POA: Diagnosis not present

## 2021-04-15 DIAGNOSIS — M25551 Pain in right hip: Secondary | ICD-10-CM | POA: Diagnosis not present

## 2021-04-15 DIAGNOSIS — Z923 Personal history of irradiation: Secondary | ICD-10-CM | POA: Diagnosis not present

## 2021-04-15 DIAGNOSIS — Z20822 Contact with and (suspected) exposure to covid-19: Secondary | ICD-10-CM | POA: Diagnosis not present

## 2021-04-15 DIAGNOSIS — K660 Peritoneal adhesions (postprocedural) (postinfection): Secondary | ICD-10-CM | POA: Diagnosis not present

## 2021-04-16 DIAGNOSIS — I700 Atherosclerosis of aorta: Secondary | ICD-10-CM | POA: Diagnosis not present

## 2021-04-16 DIAGNOSIS — R918 Other nonspecific abnormal finding of lung field: Secondary | ICD-10-CM | POA: Diagnosis not present

## 2021-04-16 DIAGNOSIS — K56600 Partial intestinal obstruction, unspecified as to cause: Secondary | ICD-10-CM | POA: Diagnosis not present

## 2021-04-16 DIAGNOSIS — R633 Feeding difficulties, unspecified: Secondary | ICD-10-CM | POA: Diagnosis not present

## 2021-04-16 DIAGNOSIS — C8590 Non-Hodgkin lymphoma, unspecified, unspecified site: Secondary | ICD-10-CM | POA: Diagnosis not present

## 2021-04-16 DIAGNOSIS — Z452 Encounter for adjustment and management of vascular access device: Secondary | ICD-10-CM | POA: Diagnosis not present

## 2021-04-17 DIAGNOSIS — R633 Feeding difficulties, unspecified: Secondary | ICD-10-CM | POA: Diagnosis not present

## 2021-04-17 DIAGNOSIS — K56600 Partial intestinal obstruction, unspecified as to cause: Secondary | ICD-10-CM | POA: Diagnosis not present

## 2021-04-17 DIAGNOSIS — C8590 Non-Hodgkin lymphoma, unspecified, unspecified site: Secondary | ICD-10-CM | POA: Diagnosis not present

## 2021-04-18 DIAGNOSIS — M25559 Pain in unspecified hip: Secondary | ICD-10-CM | POA: Diagnosis not present

## 2021-04-18 DIAGNOSIS — R633 Feeding difficulties, unspecified: Secondary | ICD-10-CM | POA: Diagnosis not present

## 2021-04-18 DIAGNOSIS — C8590 Non-Hodgkin lymphoma, unspecified, unspecified site: Secondary | ICD-10-CM | POA: Diagnosis not present

## 2021-04-18 DIAGNOSIS — R110 Nausea: Secondary | ICD-10-CM | POA: Diagnosis not present

## 2021-04-19 DIAGNOSIS — R110 Nausea: Secondary | ICD-10-CM | POA: Diagnosis not present

## 2021-04-19 DIAGNOSIS — R633 Feeding difficulties, unspecified: Secondary | ICD-10-CM | POA: Diagnosis not present

## 2021-04-19 DIAGNOSIS — M25559 Pain in unspecified hip: Secondary | ICD-10-CM | POA: Diagnosis not present

## 2021-04-19 DIAGNOSIS — C8590 Non-Hodgkin lymphoma, unspecified, unspecified site: Secondary | ICD-10-CM | POA: Diagnosis not present

## 2021-04-20 DIAGNOSIS — R633 Feeding difficulties, unspecified: Secondary | ICD-10-CM | POA: Diagnosis not present

## 2021-04-20 DIAGNOSIS — C8590 Non-Hodgkin lymphoma, unspecified, unspecified site: Secondary | ICD-10-CM | POA: Diagnosis not present

## 2021-04-20 DIAGNOSIS — R110 Nausea: Secondary | ICD-10-CM | POA: Diagnosis not present

## 2021-04-20 DIAGNOSIS — M25559 Pain in unspecified hip: Secondary | ICD-10-CM | POA: Diagnosis not present

## 2021-04-21 DIAGNOSIS — R110 Nausea: Secondary | ICD-10-CM | POA: Diagnosis not present

## 2021-04-21 DIAGNOSIS — R633 Feeding difficulties, unspecified: Secondary | ICD-10-CM | POA: Diagnosis not present

## 2021-04-21 DIAGNOSIS — C8590 Non-Hodgkin lymphoma, unspecified, unspecified site: Secondary | ICD-10-CM | POA: Diagnosis not present

## 2021-04-21 DIAGNOSIS — M25559 Pain in unspecified hip: Secondary | ICD-10-CM | POA: Diagnosis not present

## 2021-04-22 DIAGNOSIS — R633 Feeding difficulties, unspecified: Secondary | ICD-10-CM | POA: Diagnosis not present

## 2021-04-22 DIAGNOSIS — M25559 Pain in unspecified hip: Secondary | ICD-10-CM | POA: Diagnosis not present

## 2021-04-22 DIAGNOSIS — C8590 Non-Hodgkin lymphoma, unspecified, unspecified site: Secondary | ICD-10-CM | POA: Diagnosis not present

## 2021-04-22 DIAGNOSIS — R110 Nausea: Secondary | ICD-10-CM | POA: Diagnosis not present

## 2021-04-25 DIAGNOSIS — Z01818 Encounter for other preprocedural examination: Secondary | ICD-10-CM | POA: Diagnosis not present

## 2021-04-25 DIAGNOSIS — K56609 Unspecified intestinal obstruction, unspecified as to partial versus complete obstruction: Secondary | ICD-10-CM | POA: Diagnosis not present

## 2021-04-25 NOTE — Progress Notes (Signed)
Pt called to notify Dr Irene Limbo that she is in the hospital in [place] and that they are going to do her GI surgery there. Pt states she is doing fine and the MD there will reach out to Dr Irene Limbo.

## 2021-04-26 DIAGNOSIS — R633 Feeding difficulties, unspecified: Secondary | ICD-10-CM | POA: Diagnosis not present

## 2021-04-26 DIAGNOSIS — R110 Nausea: Secondary | ICD-10-CM | POA: Diagnosis not present

## 2021-04-26 DIAGNOSIS — C8590 Non-Hodgkin lymphoma, unspecified, unspecified site: Secondary | ICD-10-CM | POA: Diagnosis not present

## 2021-04-26 DIAGNOSIS — M25559 Pain in unspecified hip: Secondary | ICD-10-CM | POA: Diagnosis not present

## 2021-04-28 DIAGNOSIS — C8599 Non-Hodgkin lymphoma, unspecified, extranodal and solid organ sites: Secondary | ICD-10-CM | POA: Diagnosis not present

## 2021-04-28 DIAGNOSIS — K660 Peritoneal adhesions (postprocedural) (postinfection): Secondary | ICD-10-CM | POA: Diagnosis not present

## 2021-04-28 DIAGNOSIS — K631 Perforation of intestine (nontraumatic): Secondary | ICD-10-CM | POA: Diagnosis not present

## 2021-04-28 DIAGNOSIS — Z7689 Persons encountering health services in other specified circumstances: Secondary | ICD-10-CM | POA: Diagnosis not present

## 2021-04-28 DIAGNOSIS — K651 Peritoneal abscess: Secondary | ICD-10-CM | POA: Diagnosis not present

## 2021-04-28 DIAGNOSIS — Z8572 Personal history of non-Hodgkin lymphomas: Secondary | ICD-10-CM | POA: Diagnosis not present

## 2021-04-28 DIAGNOSIS — K56609 Unspecified intestinal obstruction, unspecified as to partial versus complete obstruction: Secondary | ICD-10-CM | POA: Diagnosis not present

## 2021-04-28 DIAGNOSIS — K565 Intestinal adhesions [bands], unspecified as to partial versus complete obstruction: Secondary | ICD-10-CM | POA: Diagnosis not present

## 2021-05-04 ENCOUNTER — Other Ambulatory Visit: Payer: Self-pay

## 2021-05-04 DIAGNOSIS — C8510 Unspecified B-cell lymphoma, unspecified site: Secondary | ICD-10-CM

## 2021-05-04 MED ORDER — VENLAFAXINE HCL ER 37.5 MG PO CP24
37.5000 mg | ORAL_CAPSULE | Freq: Every day | ORAL | Status: DC
Start: 2021-05-04 — End: 2021-10-21

## 2021-05-04 NOTE — Progress Notes (Signed)
Contacted pt to follow-up on her abdominal surgery in [place], Pt was discharged yesterday and is staying there for 2 weeks to recover. 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 Pt states she is sore and weak but doing okay. Pt states she will request her pathology report from surgery be sent to Dr Irene Limbo . Pt will inform us when she returns home. Pt to call if she has any needs.

## 2021-05-05 ENCOUNTER — Other Ambulatory Visit: Payer: Self-pay | Admitting: Hematology

## 2021-05-13 DIAGNOSIS — Z7189 Other specified counseling: Secondary | ICD-10-CM | POA: Diagnosis not present

## 2021-05-13 DIAGNOSIS — C8599 Non-Hodgkin lymphoma, unspecified, extranodal and solid organ sites: Secondary | ICD-10-CM | POA: Diagnosis not present

## 2021-05-24 DIAGNOSIS — Z433 Encounter for attention to colostomy: Secondary | ICD-10-CM | POA: Diagnosis not present

## 2021-05-26 DIAGNOSIS — R110 Nausea: Secondary | ICD-10-CM | POA: Diagnosis not present

## 2021-05-26 DIAGNOSIS — E46 Unspecified protein-calorie malnutrition: Secondary | ICD-10-CM | POA: Diagnosis not present

## 2021-05-26 DIAGNOSIS — C50912 Malignant neoplasm of unspecified site of left female breast: Secondary | ICD-10-CM | POA: Diagnosis not present

## 2021-05-26 DIAGNOSIS — E039 Hypothyroidism, unspecified: Secondary | ICD-10-CM | POA: Diagnosis not present

## 2021-05-26 DIAGNOSIS — R7303 Prediabetes: Secondary | ICD-10-CM | POA: Diagnosis not present

## 2021-05-26 DIAGNOSIS — Z932 Ileostomy status: Secondary | ICD-10-CM | POA: Diagnosis not present

## 2021-05-26 DIAGNOSIS — Z79899 Other long term (current) drug therapy: Secondary | ICD-10-CM | POA: Diagnosis not present

## 2021-05-26 DIAGNOSIS — E7800 Pure hypercholesterolemia, unspecified: Secondary | ICD-10-CM | POA: Diagnosis not present

## 2021-05-26 DIAGNOSIS — C8510 Unspecified B-cell lymphoma, unspecified site: Secondary | ICD-10-CM | POA: Diagnosis not present

## 2021-05-26 DIAGNOSIS — K912 Postsurgical malabsorption, not elsewhere classified: Secondary | ICD-10-CM | POA: Diagnosis not present

## 2021-05-26 DIAGNOSIS — R42 Dizziness and giddiness: Secondary | ICD-10-CM | POA: Diagnosis not present

## 2021-06-01 DIAGNOSIS — Z170 Estrogen receptor positive status [ER+]: Secondary | ICD-10-CM | POA: Diagnosis not present

## 2021-06-01 DIAGNOSIS — C50412 Malignant neoplasm of upper-outer quadrant of left female breast: Secondary | ICD-10-CM | POA: Diagnosis not present

## 2021-06-09 DIAGNOSIS — Z432 Encounter for attention to ileostomy: Secondary | ICD-10-CM | POA: Diagnosis not present

## 2021-06-09 DIAGNOSIS — R197 Diarrhea, unspecified: Secondary | ICD-10-CM | POA: Diagnosis not present

## 2021-06-09 DIAGNOSIS — K9419 Other complications of enterostomy: Secondary | ICD-10-CM | POA: Diagnosis not present

## 2021-06-09 DIAGNOSIS — C8599 Non-Hodgkin lymphoma, unspecified, extranodal and solid organ sites: Secondary | ICD-10-CM | POA: Diagnosis not present

## 2021-06-09 DIAGNOSIS — K909 Intestinal malabsorption, unspecified: Secondary | ICD-10-CM | POA: Diagnosis not present

## 2021-06-09 DIAGNOSIS — E871 Hypo-osmolality and hyponatremia: Secondary | ICD-10-CM | POA: Diagnosis not present

## 2021-06-09 DIAGNOSIS — N289 Disorder of kidney and ureter, unspecified: Secondary | ICD-10-CM | POA: Diagnosis not present

## 2021-06-09 DIAGNOSIS — E860 Dehydration: Secondary | ICD-10-CM | POA: Diagnosis not present

## 2021-06-10 ENCOUNTER — Encounter: Payer: PPO | Admitting: Adult Health

## 2021-06-10 ENCOUNTER — Other Ambulatory Visit: Payer: Self-pay | Admitting: Pulmonary Disease

## 2021-06-13 DIAGNOSIS — Z23 Encounter for immunization: Secondary | ICD-10-CM | POA: Diagnosis not present

## 2021-06-23 DIAGNOSIS — E872 Acidosis, unspecified: Secondary | ICD-10-CM | POA: Diagnosis not present

## 2021-06-23 DIAGNOSIS — C8513 Unspecified B-cell lymphoma, intra-abdominal lymph nodes: Secondary | ICD-10-CM | POA: Diagnosis not present

## 2021-06-23 DIAGNOSIS — E860 Dehydration: Secondary | ICD-10-CM | POA: Diagnosis not present

## 2021-06-23 DIAGNOSIS — Z681 Body mass index (BMI) 19 or less, adult: Secondary | ICD-10-CM | POA: Diagnosis not present

## 2021-06-23 DIAGNOSIS — J45909 Unspecified asthma, uncomplicated: Secondary | ICD-10-CM | POA: Diagnosis not present

## 2021-06-23 DIAGNOSIS — Z9049 Acquired absence of other specified parts of digestive tract: Secondary | ICD-10-CM | POA: Diagnosis not present

## 2021-06-23 DIAGNOSIS — Z923 Personal history of irradiation: Secondary | ICD-10-CM | POA: Diagnosis not present

## 2021-06-23 DIAGNOSIS — E039 Hypothyroidism, unspecified: Secondary | ICD-10-CM | POA: Diagnosis not present

## 2021-06-23 DIAGNOSIS — Z90710 Acquired absence of both cervix and uterus: Secondary | ICD-10-CM | POA: Diagnosis not present

## 2021-06-23 DIAGNOSIS — Z20822 Contact with and (suspected) exposure to covid-19: Secondary | ICD-10-CM | POA: Diagnosis not present

## 2021-06-23 DIAGNOSIS — K219 Gastro-esophageal reflux disease without esophagitis: Secondary | ICD-10-CM | POA: Diagnosis not present

## 2021-06-23 DIAGNOSIS — R627 Adult failure to thrive: Secondary | ICD-10-CM | POA: Diagnosis not present

## 2021-06-23 DIAGNOSIS — Z4682 Encounter for fitting and adjustment of non-vascular catheter: Secondary | ICD-10-CM | POA: Diagnosis not present

## 2021-06-23 DIAGNOSIS — E43 Unspecified severe protein-calorie malnutrition: Secondary | ICD-10-CM | POA: Diagnosis not present

## 2021-06-23 DIAGNOSIS — E861 Hypovolemia: Secondary | ICD-10-CM | POA: Diagnosis not present

## 2021-06-23 DIAGNOSIS — Z4659 Encounter for fitting and adjustment of other gastrointestinal appliance and device: Secondary | ICD-10-CM | POA: Diagnosis not present

## 2021-06-23 DIAGNOSIS — Z433 Encounter for attention to colostomy: Secondary | ICD-10-CM | POA: Diagnosis not present

## 2021-06-23 DIAGNOSIS — I9589 Other hypotension: Secondary | ICD-10-CM | POA: Diagnosis not present

## 2021-06-23 DIAGNOSIS — R197 Diarrhea, unspecified: Secondary | ICD-10-CM | POA: Diagnosis not present

## 2021-06-23 DIAGNOSIS — Z853 Personal history of malignant neoplasm of breast: Secondary | ICD-10-CM | POA: Diagnosis not present

## 2021-06-23 DIAGNOSIS — E871 Hypo-osmolality and hyponatremia: Secondary | ICD-10-CM | POA: Diagnosis not present

## 2021-06-23 DIAGNOSIS — K651 Peritoneal abscess: Secondary | ICD-10-CM | POA: Diagnosis not present

## 2021-06-23 DIAGNOSIS — Z932 Ileostomy status: Secondary | ICD-10-CM | POA: Diagnosis not present

## 2021-06-23 DIAGNOSIS — R112 Nausea with vomiting, unspecified: Secondary | ICD-10-CM | POA: Diagnosis not present

## 2021-06-23 DIAGNOSIS — K9403 Colostomy malfunction: Secondary | ICD-10-CM | POA: Diagnosis not present

## 2021-06-23 DIAGNOSIS — Z9221 Personal history of antineoplastic chemotherapy: Secondary | ICD-10-CM | POA: Diagnosis not present

## 2021-06-23 DIAGNOSIS — F419 Anxiety disorder, unspecified: Secondary | ICD-10-CM | POA: Diagnosis not present

## 2021-06-23 DIAGNOSIS — C8332 Diffuse large B-cell lymphoma, intrathoracic lymph nodes: Secondary | ICD-10-CM | POA: Diagnosis not present

## 2021-06-23 DIAGNOSIS — C8338 Diffuse large B-cell lymphoma, lymph nodes of multiple sites: Secondary | ICD-10-CM | POA: Diagnosis not present

## 2021-06-23 DIAGNOSIS — T8143XA Infection following a procedure, organ and space surgical site, initial encounter: Secondary | ICD-10-CM | POA: Diagnosis not present

## 2021-06-23 DIAGNOSIS — N179 Acute kidney failure, unspecified: Secondary | ICD-10-CM | POA: Diagnosis not present

## 2021-06-24 DIAGNOSIS — R197 Diarrhea, unspecified: Secondary | ICD-10-CM | POA: Diagnosis not present

## 2021-06-24 DIAGNOSIS — E861 Hypovolemia: Secondary | ICD-10-CM | POA: Diagnosis not present

## 2021-06-24 DIAGNOSIS — C8332 Diffuse large B-cell lymphoma, intrathoracic lymph nodes: Secondary | ICD-10-CM | POA: Diagnosis not present

## 2021-06-24 DIAGNOSIS — E43 Unspecified severe protein-calorie malnutrition: Secondary | ICD-10-CM | POA: Diagnosis not present

## 2021-06-24 DIAGNOSIS — Z4659 Encounter for fitting and adjustment of other gastrointestinal appliance and device: Secondary | ICD-10-CM | POA: Diagnosis not present

## 2021-06-24 DIAGNOSIS — I9589 Other hypotension: Secondary | ICD-10-CM | POA: Diagnosis not present

## 2021-06-24 DIAGNOSIS — R112 Nausea with vomiting, unspecified: Secondary | ICD-10-CM | POA: Diagnosis not present

## 2021-06-24 DIAGNOSIS — R627 Adult failure to thrive: Secondary | ICD-10-CM | POA: Diagnosis not present

## 2021-06-24 DIAGNOSIS — Z932 Ileostomy status: Secondary | ICD-10-CM | POA: Diagnosis not present

## 2021-06-25 DIAGNOSIS — R627 Adult failure to thrive: Secondary | ICD-10-CM | POA: Diagnosis not present

## 2021-06-25 DIAGNOSIS — Z932 Ileostomy status: Secondary | ICD-10-CM | POA: Diagnosis not present

## 2021-06-25 DIAGNOSIS — R112 Nausea with vomiting, unspecified: Secondary | ICD-10-CM | POA: Diagnosis not present

## 2021-06-25 DIAGNOSIS — I9589 Other hypotension: Secondary | ICD-10-CM | POA: Diagnosis not present

## 2021-06-25 DIAGNOSIS — E43 Unspecified severe protein-calorie malnutrition: Secondary | ICD-10-CM | POA: Diagnosis not present

## 2021-06-25 DIAGNOSIS — R197 Diarrhea, unspecified: Secondary | ICD-10-CM | POA: Diagnosis not present

## 2021-06-25 DIAGNOSIS — E861 Hypovolemia: Secondary | ICD-10-CM | POA: Diagnosis not present

## 2021-06-25 DIAGNOSIS — C8332 Diffuse large B-cell lymphoma, intrathoracic lymph nodes: Secondary | ICD-10-CM | POA: Diagnosis not present

## 2021-06-26 DIAGNOSIS — E861 Hypovolemia: Secondary | ICD-10-CM | POA: Diagnosis not present

## 2021-06-26 DIAGNOSIS — R197 Diarrhea, unspecified: Secondary | ICD-10-CM | POA: Diagnosis not present

## 2021-06-26 DIAGNOSIS — C8513 Unspecified B-cell lymphoma, intra-abdominal lymph nodes: Secondary | ICD-10-CM | POA: Diagnosis not present

## 2021-06-26 DIAGNOSIS — E43 Unspecified severe protein-calorie malnutrition: Secondary | ICD-10-CM | POA: Diagnosis not present

## 2021-06-26 DIAGNOSIS — Z932 Ileostomy status: Secondary | ICD-10-CM | POA: Diagnosis not present

## 2021-06-26 DIAGNOSIS — Z90710 Acquired absence of both cervix and uterus: Secondary | ICD-10-CM | POA: Diagnosis not present

## 2021-06-26 DIAGNOSIS — C8332 Diffuse large B-cell lymphoma, intrathoracic lymph nodes: Secondary | ICD-10-CM | POA: Diagnosis not present

## 2021-06-26 DIAGNOSIS — I9589 Other hypotension: Secondary | ICD-10-CM | POA: Diagnosis not present

## 2021-06-26 DIAGNOSIS — R112 Nausea with vomiting, unspecified: Secondary | ICD-10-CM | POA: Diagnosis not present

## 2021-06-26 DIAGNOSIS — R627 Adult failure to thrive: Secondary | ICD-10-CM | POA: Diagnosis not present

## 2021-06-27 DIAGNOSIS — Z4682 Encounter for fitting and adjustment of non-vascular catheter: Secondary | ICD-10-CM | POA: Diagnosis not present

## 2021-06-27 DIAGNOSIS — R627 Adult failure to thrive: Secondary | ICD-10-CM | POA: Diagnosis not present

## 2021-06-27 DIAGNOSIS — R197 Diarrhea, unspecified: Secondary | ICD-10-CM | POA: Diagnosis not present

## 2021-06-27 DIAGNOSIS — E861 Hypovolemia: Secondary | ICD-10-CM | POA: Diagnosis not present

## 2021-06-27 DIAGNOSIS — C8332 Diffuse large B-cell lymphoma, intrathoracic lymph nodes: Secondary | ICD-10-CM | POA: Diagnosis not present

## 2021-06-27 DIAGNOSIS — Z932 Ileostomy status: Secondary | ICD-10-CM | POA: Diagnosis not present

## 2021-06-27 DIAGNOSIS — R112 Nausea with vomiting, unspecified: Secondary | ICD-10-CM | POA: Diagnosis not present

## 2021-06-27 DIAGNOSIS — Z853 Personal history of malignant neoplasm of breast: Secondary | ICD-10-CM | POA: Diagnosis not present

## 2021-06-27 DIAGNOSIS — E43 Unspecified severe protein-calorie malnutrition: Secondary | ICD-10-CM | POA: Diagnosis not present

## 2021-06-27 DIAGNOSIS — I9589 Other hypotension: Secondary | ICD-10-CM | POA: Diagnosis not present

## 2021-06-27 DIAGNOSIS — K651 Peritoneal abscess: Secondary | ICD-10-CM | POA: Diagnosis not present

## 2021-06-28 DIAGNOSIS — K219 Gastro-esophageal reflux disease without esophagitis: Secondary | ICD-10-CM | POA: Diagnosis not present

## 2021-06-28 DIAGNOSIS — Z433 Encounter for attention to colostomy: Secondary | ICD-10-CM | POA: Diagnosis not present

## 2021-06-28 DIAGNOSIS — E039 Hypothyroidism, unspecified: Secondary | ICD-10-CM | POA: Diagnosis not present

## 2021-06-28 DIAGNOSIS — R627 Adult failure to thrive: Secondary | ICD-10-CM | POA: Diagnosis not present

## 2021-06-28 DIAGNOSIS — R112 Nausea with vomiting, unspecified: Secondary | ICD-10-CM | POA: Diagnosis not present

## 2021-06-28 DIAGNOSIS — C8332 Diffuse large B-cell lymphoma, intrathoracic lymph nodes: Secondary | ICD-10-CM | POA: Diagnosis not present

## 2021-06-29 DIAGNOSIS — E039 Hypothyroidism, unspecified: Secondary | ICD-10-CM | POA: Diagnosis not present

## 2021-06-29 DIAGNOSIS — K219 Gastro-esophageal reflux disease without esophagitis: Secondary | ICD-10-CM | POA: Diagnosis not present

## 2021-06-29 DIAGNOSIS — C8332 Diffuse large B-cell lymphoma, intrathoracic lymph nodes: Secondary | ICD-10-CM | POA: Diagnosis not present

## 2021-06-29 DIAGNOSIS — R627 Adult failure to thrive: Secondary | ICD-10-CM | POA: Diagnosis not present

## 2021-06-29 DIAGNOSIS — R112 Nausea with vomiting, unspecified: Secondary | ICD-10-CM | POA: Diagnosis not present

## 2021-06-30 DIAGNOSIS — R627 Adult failure to thrive: Secondary | ICD-10-CM | POA: Diagnosis not present

## 2021-06-30 DIAGNOSIS — R112 Nausea with vomiting, unspecified: Secondary | ICD-10-CM | POA: Diagnosis not present

## 2021-06-30 DIAGNOSIS — E039 Hypothyroidism, unspecified: Secondary | ICD-10-CM | POA: Diagnosis not present

## 2021-06-30 DIAGNOSIS — K219 Gastro-esophageal reflux disease without esophagitis: Secondary | ICD-10-CM | POA: Diagnosis not present

## 2021-06-30 DIAGNOSIS — C8332 Diffuse large B-cell lymphoma, intrathoracic lymph nodes: Secondary | ICD-10-CM | POA: Diagnosis not present

## 2021-07-01 ENCOUNTER — Other Ambulatory Visit: Payer: Self-pay | Admitting: Hematology

## 2021-07-01 DIAGNOSIS — E039 Hypothyroidism, unspecified: Secondary | ICD-10-CM | POA: Diagnosis not present

## 2021-07-01 DIAGNOSIS — R112 Nausea with vomiting, unspecified: Secondary | ICD-10-CM | POA: Diagnosis not present

## 2021-07-01 DIAGNOSIS — R627 Adult failure to thrive: Secondary | ICD-10-CM | POA: Diagnosis not present

## 2021-07-01 DIAGNOSIS — C8510 Unspecified B-cell lymphoma, unspecified site: Secondary | ICD-10-CM

## 2021-07-01 DIAGNOSIS — K219 Gastro-esophageal reflux disease without esophagitis: Secondary | ICD-10-CM | POA: Diagnosis not present

## 2021-07-01 DIAGNOSIS — C8332 Diffuse large B-cell lymphoma, intrathoracic lymph nodes: Secondary | ICD-10-CM | POA: Diagnosis not present

## 2021-07-12 DIAGNOSIS — L02211 Cutaneous abscess of abdominal wall: Secondary | ICD-10-CM | POA: Diagnosis not present

## 2021-07-12 DIAGNOSIS — F419 Anxiety disorder, unspecified: Secondary | ICD-10-CM | POA: Diagnosis not present

## 2021-07-12 DIAGNOSIS — E039 Hypothyroidism, unspecified: Secondary | ICD-10-CM | POA: Diagnosis not present

## 2021-07-12 DIAGNOSIS — C50912 Malignant neoplasm of unspecified site of left female breast: Secondary | ICD-10-CM | POA: Diagnosis not present

## 2021-07-12 DIAGNOSIS — C8330 Diffuse large B-cell lymphoma, unspecified site: Secondary | ICD-10-CM | POA: Diagnosis not present

## 2021-07-12 DIAGNOSIS — K219 Gastro-esophageal reflux disease without esophagitis: Secondary | ICD-10-CM | POA: Diagnosis not present

## 2021-07-12 DIAGNOSIS — J45909 Unspecified asthma, uncomplicated: Secondary | ICD-10-CM | POA: Diagnosis not present

## 2021-07-12 DIAGNOSIS — R627 Adult failure to thrive: Secondary | ICD-10-CM | POA: Diagnosis not present

## 2021-07-12 DIAGNOSIS — Z48815 Encounter for surgical aftercare following surgery on the digestive system: Secondary | ICD-10-CM | POA: Diagnosis not present

## 2021-07-12 DIAGNOSIS — Z4801 Encounter for change or removal of surgical wound dressing: Secondary | ICD-10-CM | POA: Diagnosis not present

## 2021-07-14 DIAGNOSIS — Z9049 Acquired absence of other specified parts of digestive tract: Secondary | ICD-10-CM | POA: Diagnosis not present

## 2021-07-14 DIAGNOSIS — C8599 Non-Hodgkin lymphoma, unspecified, extranodal and solid organ sites: Secondary | ICD-10-CM | POA: Diagnosis not present

## 2021-07-14 DIAGNOSIS — I700 Atherosclerosis of aorta: Secondary | ICD-10-CM | POA: Diagnosis not present

## 2021-07-14 DIAGNOSIS — C8332 Diffuse large B-cell lymphoma, intrathoracic lymph nodes: Secondary | ICD-10-CM | POA: Diagnosis not present

## 2021-07-14 DIAGNOSIS — K6389 Other specified diseases of intestine: Secondary | ICD-10-CM | POA: Diagnosis not present

## 2021-07-15 DIAGNOSIS — Z8572 Personal history of non-Hodgkin lymphomas: Secondary | ICD-10-CM | POA: Diagnosis not present

## 2021-07-15 DIAGNOSIS — J984 Other disorders of lung: Secondary | ICD-10-CM | POA: Diagnosis not present

## 2021-08-03 DIAGNOSIS — Z8572 Personal history of non-Hodgkin lymphomas: Secondary | ICD-10-CM | POA: Diagnosis not present

## 2021-08-03 DIAGNOSIS — R0902 Hypoxemia: Secondary | ICD-10-CM | POA: Diagnosis not present

## 2021-08-03 DIAGNOSIS — E871 Hypo-osmolality and hyponatremia: Secondary | ICD-10-CM | POA: Diagnosis not present

## 2021-08-03 DIAGNOSIS — C8332 Diffuse large B-cell lymphoma, intrathoracic lymph nodes: Secondary | ICD-10-CM | POA: Diagnosis not present

## 2021-08-03 DIAGNOSIS — Z1289 Encounter for screening for malignant neoplasm of other sites: Secondary | ICD-10-CM | POA: Diagnosis not present

## 2021-08-03 DIAGNOSIS — E861 Hypovolemia: Secondary | ICD-10-CM | POA: Diagnosis not present

## 2021-08-03 DIAGNOSIS — U071 COVID-19: Secondary | ICD-10-CM | POA: Diagnosis not present

## 2021-08-03 DIAGNOSIS — I959 Hypotension, unspecified: Secondary | ICD-10-CM | POA: Diagnosis not present

## 2021-08-03 DIAGNOSIS — R0602 Shortness of breath: Secondary | ICD-10-CM | POA: Diagnosis not present

## 2021-08-03 DIAGNOSIS — Z433 Encounter for attention to colostomy: Secondary | ICD-10-CM | POA: Diagnosis not present

## 2021-08-03 DIAGNOSIS — I9589 Other hypotension: Secondary | ICD-10-CM | POA: Diagnosis not present

## 2021-08-03 DIAGNOSIS — Z452 Encounter for adjustment and management of vascular access device: Secondary | ICD-10-CM | POA: Diagnosis not present

## 2021-08-04 DIAGNOSIS — R0602 Shortness of breath: Secondary | ICD-10-CM | POA: Diagnosis not present

## 2021-08-04 DIAGNOSIS — U071 COVID-19: Secondary | ICD-10-CM | POA: Diagnosis not present

## 2021-08-04 DIAGNOSIS — C8332 Diffuse large B-cell lymphoma, intrathoracic lymph nodes: Secondary | ICD-10-CM | POA: Diagnosis not present

## 2021-08-04 DIAGNOSIS — E861 Hypovolemia: Secondary | ICD-10-CM | POA: Diagnosis not present

## 2021-08-04 DIAGNOSIS — E871 Hypo-osmolality and hyponatremia: Secondary | ICD-10-CM | POA: Diagnosis not present

## 2021-08-04 DIAGNOSIS — I361 Nonrheumatic tricuspid (valve) insufficiency: Secondary | ICD-10-CM | POA: Diagnosis not present

## 2021-08-04 DIAGNOSIS — I9589 Other hypotension: Secondary | ICD-10-CM | POA: Diagnosis not present

## 2021-08-05 DIAGNOSIS — I9589 Other hypotension: Secondary | ICD-10-CM | POA: Diagnosis not present

## 2021-08-05 DIAGNOSIS — C8332 Diffuse large B-cell lymphoma, intrathoracic lymph nodes: Secondary | ICD-10-CM | POA: Diagnosis not present

## 2021-08-05 DIAGNOSIS — U071 COVID-19: Secondary | ICD-10-CM | POA: Diagnosis not present

## 2021-08-05 DIAGNOSIS — E861 Hypovolemia: Secondary | ICD-10-CM | POA: Diagnosis not present

## 2021-08-05 DIAGNOSIS — E871 Hypo-osmolality and hyponatremia: Secondary | ICD-10-CM | POA: Diagnosis not present

## 2021-08-06 DIAGNOSIS — C8332 Diffuse large B-cell lymphoma, intrathoracic lymph nodes: Secondary | ICD-10-CM | POA: Diagnosis not present

## 2021-08-06 DIAGNOSIS — U071 COVID-19: Secondary | ICD-10-CM | POA: Diagnosis not present

## 2021-08-08 DIAGNOSIS — R197 Diarrhea, unspecified: Secondary | ICD-10-CM | POA: Diagnosis not present

## 2021-08-08 DIAGNOSIS — C8332 Diffuse large B-cell lymphoma, intrathoracic lymph nodes: Secondary | ICD-10-CM | POA: Diagnosis not present

## 2021-08-12 DIAGNOSIS — E860 Dehydration: Secondary | ICD-10-CM | POA: Diagnosis not present

## 2021-08-23 ENCOUNTER — Other Ambulatory Visit: Payer: Self-pay | Admitting: Family Medicine

## 2021-08-23 DIAGNOSIS — Z853 Personal history of malignant neoplasm of breast: Secondary | ICD-10-CM

## 2021-09-01 DIAGNOSIS — R188 Other ascites: Secondary | ICD-10-CM | POA: Diagnosis not present

## 2021-09-01 DIAGNOSIS — Z932 Ileostomy status: Secondary | ICD-10-CM | POA: Diagnosis not present

## 2021-09-06 DIAGNOSIS — E860 Dehydration: Secondary | ICD-10-CM | POA: Diagnosis not present

## 2021-09-06 DIAGNOSIS — Z433 Encounter for attention to colostomy: Secondary | ICD-10-CM | POA: Diagnosis not present

## 2021-09-13 DIAGNOSIS — E039 Hypothyroidism, unspecified: Secondary | ICD-10-CM | POA: Diagnosis not present

## 2021-09-13 DIAGNOSIS — Z1331 Encounter for screening for depression: Secondary | ICD-10-CM | POA: Diagnosis not present

## 2021-09-13 DIAGNOSIS — Z681 Body mass index (BMI) 19 or less, adult: Secondary | ICD-10-CM | POA: Diagnosis not present

## 2021-09-13 DIAGNOSIS — E860 Dehydration: Secondary | ICD-10-CM | POA: Diagnosis not present

## 2021-09-13 DIAGNOSIS — I700 Atherosclerosis of aorta: Secondary | ICD-10-CM | POA: Diagnosis not present

## 2021-09-13 DIAGNOSIS — Z9181 History of falling: Secondary | ICD-10-CM | POA: Diagnosis not present

## 2021-09-13 DIAGNOSIS — C8510 Unspecified B-cell lymphoma, unspecified site: Secondary | ICD-10-CM | POA: Diagnosis not present

## 2021-09-13 DIAGNOSIS — Z932 Ileostomy status: Secondary | ICD-10-CM | POA: Diagnosis not present

## 2021-09-13 DIAGNOSIS — E46 Unspecified protein-calorie malnutrition: Secondary | ICD-10-CM | POA: Diagnosis not present

## 2021-09-20 DIAGNOSIS — L739 Follicular disorder, unspecified: Secondary | ICD-10-CM | POA: Diagnosis not present

## 2021-09-20 DIAGNOSIS — L738 Other specified follicular disorders: Secondary | ICD-10-CM | POA: Diagnosis not present

## 2021-09-28 ENCOUNTER — Ambulatory Visit
Admission: RE | Admit: 2021-09-28 | Discharge: 2021-09-28 | Disposition: A | Discharge status: Home / Self Care | Payer: PPO | Source: Ambulatory Visit | Attending: Family Medicine | Admitting: Family Medicine

## 2021-09-28 ENCOUNTER — Other Ambulatory Visit: Payer: Self-pay

## 2021-09-28 DIAGNOSIS — Z853 Personal history of malignant neoplasm of breast: Secondary | ICD-10-CM

## 2021-09-28 DIAGNOSIS — R922 Inconclusive mammogram: Secondary | ICD-10-CM | POA: Diagnosis not present

## 2021-10-03 DIAGNOSIS — Z8572 Personal history of non-Hodgkin lymphomas: Secondary | ICD-10-CM | POA: Diagnosis not present

## 2021-10-03 DIAGNOSIS — Z9049 Acquired absence of other specified parts of digestive tract: Secondary | ICD-10-CM | POA: Diagnosis not present

## 2021-10-03 DIAGNOSIS — Z90710 Acquired absence of both cervix and uterus: Secondary | ICD-10-CM | POA: Diagnosis not present

## 2021-10-03 DIAGNOSIS — I708 Atherosclerosis of other arteries: Secondary | ICD-10-CM | POA: Diagnosis not present

## 2021-10-03 DIAGNOSIS — Z932 Ileostomy status: Secondary | ICD-10-CM | POA: Diagnosis not present

## 2021-10-03 DIAGNOSIS — R188 Other ascites: Secondary | ICD-10-CM | POA: Diagnosis not present

## 2021-10-03 DIAGNOSIS — N3289 Other specified disorders of bladder: Secondary | ICD-10-CM | POA: Diagnosis not present

## 2021-10-07 DIAGNOSIS — C8332 Diffuse large B-cell lymphoma, intrathoracic lymph nodes: Secondary | ICD-10-CM | POA: Diagnosis not present

## 2021-10-10 DIAGNOSIS — Z433 Encounter for attention to colostomy: Secondary | ICD-10-CM | POA: Diagnosis not present

## 2021-10-11 DIAGNOSIS — E860 Dehydration: Secondary | ICD-10-CM | POA: Diagnosis not present

## 2021-10-17 DIAGNOSIS — K929 Disease of digestive system, unspecified: Secondary | ICD-10-CM | POA: Diagnosis not present

## 2021-10-17 DIAGNOSIS — R188 Other ascites: Secondary | ICD-10-CM | POA: Diagnosis not present

## 2021-10-17 DIAGNOSIS — K660 Peritoneal adhesions (postprocedural) (postinfection): Secondary | ICD-10-CM | POA: Diagnosis not present

## 2021-10-17 DIAGNOSIS — Z01810 Encounter for preprocedural cardiovascular examination: Secondary | ICD-10-CM | POA: Diagnosis not present

## 2021-10-17 DIAGNOSIS — Z87891 Personal history of nicotine dependence: Secondary | ICD-10-CM | POA: Diagnosis not present

## 2021-10-17 DIAGNOSIS — Z681 Body mass index (BMI) 19 or less, adult: Secondary | ICD-10-CM | POA: Diagnosis not present

## 2021-10-17 DIAGNOSIS — Z01818 Encounter for other preprocedural examination: Secondary | ICD-10-CM | POA: Diagnosis not present

## 2021-10-17 DIAGNOSIS — C8513 Unspecified B-cell lymphoma, intra-abdominal lymph nodes: Secondary | ICD-10-CM | POA: Diagnosis not present

## 2021-10-17 DIAGNOSIS — R7303 Prediabetes: Secondary | ICD-10-CM | POA: Diagnosis not present

## 2021-10-17 DIAGNOSIS — Z9221 Personal history of antineoplastic chemotherapy: Secondary | ICD-10-CM | POA: Diagnosis not present

## 2021-10-17 DIAGNOSIS — E860 Dehydration: Secondary | ICD-10-CM | POA: Diagnosis not present

## 2021-10-17 DIAGNOSIS — Z432 Encounter for attention to ileostomy: Secondary | ICD-10-CM | POA: Diagnosis not present

## 2021-10-17 DIAGNOSIS — J45909 Unspecified asthma, uncomplicated: Secondary | ICD-10-CM | POA: Diagnosis not present

## 2021-10-17 DIAGNOSIS — Z853 Personal history of malignant neoplasm of breast: Secondary | ICD-10-CM | POA: Diagnosis not present

## 2021-10-17 DIAGNOSIS — G8918 Other acute postprocedural pain: Secondary | ICD-10-CM | POA: Diagnosis not present

## 2021-10-17 DIAGNOSIS — K219 Gastro-esophageal reflux disease without esophagitis: Secondary | ICD-10-CM | POA: Diagnosis not present

## 2021-10-17 DIAGNOSIS — E039 Hypothyroidism, unspecified: Secondary | ICD-10-CM | POA: Diagnosis not present

## 2021-10-17 DIAGNOSIS — Z90710 Acquired absence of both cervix and uterus: Secondary | ICD-10-CM | POA: Diagnosis not present

## 2021-10-17 DIAGNOSIS — Z932 Ileostomy status: Secondary | ICD-10-CM | POA: Diagnosis not present

## 2021-10-17 DIAGNOSIS — Z923 Personal history of irradiation: Secondary | ICD-10-CM | POA: Diagnosis not present

## 2021-10-17 DIAGNOSIS — I10 Essential (primary) hypertension: Secondary | ICD-10-CM | POA: Diagnosis not present

## 2021-10-17 DIAGNOSIS — R636 Underweight: Secondary | ICD-10-CM | POA: Diagnosis not present

## 2021-10-21 ENCOUNTER — Other Ambulatory Visit: Payer: Self-pay | Admitting: Hematology

## 2021-10-21 ENCOUNTER — Other Ambulatory Visit: Payer: Self-pay | Admitting: Pulmonary Disease

## 2021-10-21 ENCOUNTER — Other Ambulatory Visit: Payer: Self-pay | Admitting: Hematology and Oncology

## 2021-10-21 DIAGNOSIS — C8510 Unspecified B-cell lymphoma, unspecified site: Secondary | ICD-10-CM

## 2021-10-22 ENCOUNTER — Other Ambulatory Visit: Payer: Self-pay | Admitting: Hematology and Oncology

## 2021-10-22 DIAGNOSIS — C8510 Unspecified B-cell lymphoma, unspecified site: Secondary | ICD-10-CM

## 2021-10-24 ENCOUNTER — Encounter: Payer: Self-pay | Admitting: Hematology

## 2021-10-24 ENCOUNTER — Encounter: Payer: Self-pay | Admitting: Hematology and Oncology

## 2021-11-02 DIAGNOSIS — R609 Edema, unspecified: Secondary | ICD-10-CM | POA: Diagnosis not present

## 2021-11-02 DIAGNOSIS — J069 Acute upper respiratory infection, unspecified: Secondary | ICD-10-CM | POA: Diagnosis not present

## 2021-11-02 DIAGNOSIS — D649 Anemia, unspecified: Secondary | ICD-10-CM | POA: Diagnosis not present

## 2021-11-10 DIAGNOSIS — R7303 Prediabetes: Secondary | ICD-10-CM | POA: Diagnosis not present

## 2021-11-10 DIAGNOSIS — C8332 Diffuse large B-cell lymphoma, intrathoracic lymph nodes: Secondary | ICD-10-CM | POA: Diagnosis not present

## 2021-11-10 DIAGNOSIS — I10 Essential (primary) hypertension: Secondary | ICD-10-CM | POA: Diagnosis not present

## 2021-11-10 DIAGNOSIS — J45909 Unspecified asthma, uncomplicated: Secondary | ICD-10-CM | POA: Diagnosis not present

## 2021-11-10 DIAGNOSIS — Z853 Personal history of malignant neoplasm of breast: Secondary | ICD-10-CM | POA: Diagnosis not present

## 2021-11-10 DIAGNOSIS — Z9889 Other specified postprocedural states: Secondary | ICD-10-CM | POA: Diagnosis not present

## 2021-11-10 DIAGNOSIS — I824Y9 Acute embolism and thrombosis of unspecified deep veins of unspecified proximal lower extremity: Secondary | ICD-10-CM | POA: Diagnosis not present

## 2021-11-10 DIAGNOSIS — I700 Atherosclerosis of aorta: Secondary | ICD-10-CM | POA: Diagnosis not present

## 2021-11-10 DIAGNOSIS — Z95828 Presence of other vascular implants and grafts: Secondary | ICD-10-CM | POA: Diagnosis not present

## 2021-11-10 DIAGNOSIS — F419 Anxiety disorder, unspecified: Secondary | ICD-10-CM | POA: Diagnosis not present

## 2021-11-10 DIAGNOSIS — R188 Other ascites: Secondary | ICD-10-CM | POA: Diagnosis not present

## 2021-11-10 DIAGNOSIS — M7989 Other specified soft tissue disorders: Secondary | ICD-10-CM | POA: Diagnosis not present

## 2021-11-10 DIAGNOSIS — M8580 Other specified disorders of bone density and structure, unspecified site: Secondary | ICD-10-CM | POA: Diagnosis not present

## 2021-11-10 DIAGNOSIS — D500 Iron deficiency anemia secondary to blood loss (chronic): Secondary | ICD-10-CM | POA: Diagnosis not present

## 2021-11-10 DIAGNOSIS — Z932 Ileostomy status: Secondary | ICD-10-CM | POA: Diagnosis not present

## 2021-11-10 DIAGNOSIS — Z8616 Personal history of COVID-19: Secondary | ICD-10-CM | POA: Diagnosis not present

## 2021-11-10 DIAGNOSIS — R2243 Localized swelling, mass and lump, lower limb, bilateral: Secondary | ICD-10-CM | POA: Diagnosis not present

## 2021-11-10 DIAGNOSIS — R197 Diarrhea, unspecified: Secondary | ICD-10-CM | POA: Diagnosis not present

## 2021-11-10 DIAGNOSIS — E7800 Pure hypercholesterolemia, unspecified: Secondary | ICD-10-CM | POA: Diagnosis not present

## 2021-11-11 DIAGNOSIS — C8332 Diffuse large B-cell lymphoma, intrathoracic lymph nodes: Secondary | ICD-10-CM | POA: Diagnosis not present

## 2021-11-14 ENCOUNTER — Telehealth: Payer: Self-pay | Admitting: Hematology and Oncology

## 2021-11-14 NOTE — Telephone Encounter (Signed)
.  Called patient to schedule appointment per 3/9 inbasket, patient is aware of date and time.   ?

## 2021-11-15 NOTE — Progress Notes (Signed)
? ?Patient Care Team: ?Hamrick, Lorin Mercy, MD as PCP - General (Family Medicine) ?Juanito Doom, MD as Consulting Physician (Pulmonary Disease) ?Elsie Saas, MD as Consulting Physician (Orthopedic Surgery) ?Allyn Kenner, MD as Consulting Physician (Dermatology) ?Katy Apo, MD as Consulting Physician (Ophthalmology) ?Jovita Kussmaul, MD as Consulting Physician (General Surgery) ?Nicholas Lose, MD as Consulting Physician (Hematology and Oncology) ?Garlan Fair, MD as Consulting Physician (Gastroenterology) ?Gardenia Phlegm, NP as Nurse Practitioner (Hematology and Oncology) ? ?DIAGNOSIS:  ?Encounter Diagnoses  ?Name Primary?  ? Malignant neoplasm of upper-outer quadrant of left breast in female, estrogen receptor positive ([place])   ? 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 ? ?SUMMARY OF ONCOLOGIC HISTORY: ?Oncology History  ?Cancer of right breast, stage 0  ?09/13/2009 Initial Biopsy  ? 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  ?10/11/2009 Surgery  ? Right breast lumpectomy: High-grade DCIS with necrosis and microcalcifications 1 SLN negative, ER 100%, PR 93% ?  ?03/04/2010 - 03/2015 Anti-estrogen oral therapy  ? Aromasin 25 mg daily with Effexor 75 mg daily for hot flashes ?  ?09/27/2020 Relapse/Recurrence  ? Mammogram showed a 0.8cm upper outer left breast mass. Biopsy showed invasive and in situ ductal carcinoma, HER-2 equivocal by IHC (2+), negative by FISH (ratio 1.59), ER+ >95%, PR+ 85%, Ki67 10%.  ?  ?Malignant neoplasm of left breast in female, estrogen receptor positive ([place])  ?10/12/2020 Initial Diagnosis  ? Malignant neoplasm of left breast in female, estrogen receptor positive ([place]) ?  ?10/12/2020 Cancer Staging  ? Staging form: Breast, AJCC 8th Edition ?- Clinical stage from 10/12/2020: Stage IA (cT1b, cN0, cM0, G2, ER+, PR+, HER2-) - Signed by Nicholas Lose, MD on 10/15/2020 ?Stage prefix: Initial diagnosis ? ?  ?11/05/2020 Surgery  ? Left lumpectomy: Grade 1 IDC, 1.1 cm,  intermediate grade DCIS, margins are negative, lymph node -0/1, ER greater than 95%, PR 85%, HER-2 negative, Ki-67 10% ?  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  ?11/15/2020 -  Chemotherapy  ?  Patient is on Treatment Plan: IP NON-HODGKINS LYMPHOMA EPOCH Q21D ? ? Patient is on Antibody Plan: NON-HODGKINS LYMPHOMA RITUXIMAB Q21D  ? ?  ?11/19/2020 -  Chemotherapy  ?  Patient is on Treatment Plan: IP NON-HODGKINS LYMPHOMA EPOCH Q21D ? ? Patient is on Antibody Plan: NON-HODGKINS LYMPHOMA RITUXIMAB Q21D  ? ?  ? ?  ?CHIEF COMPLIANT:  ?Follow-up of breast cancer ? ?INTERVAL HISTORY: Lindsey Price is a  76 y.o. with above-mentioned history of right breast DCIS and  left breast cancer. She underwent a left lumpectomy on 11/05/20 with Dr. Marlou Starks for which pathology showed invasive ductal carcinoma with DCIS, grade 1, 1.1cm, clear margins, one left axillary lymph node negative for carcinoma. She was diagnosed with aggressive non-Hodgkin's lymphoma and was getting chemotherapy with EPOCH-R.  She completed 6 cycles as of July 2022.  She is currently on surveillance. mammogram done on 09/28/2021 was benign.  She had abdominal surgery for removal of the intestinal blockage in [place].  She underwent ileostomy and finally 3 weeks ago the ileostomy was reversed.  She complains of lower extremity swelling. ? ?ALLERGIES:  is allergic to advair diskus [fluticasone-salmeterol] and neosporin [neomycin-polymyxin-gramicidin]. ? ?MEDICATIONS:  ?Current Outpatient Medications  ?Medication Sig Dispense Refill  ? albuterol (VENTOLIN HFA) 108 (90 Base) MCG/ACT inhaler INHALE 1-2 PUFFS INTO THE LUNGS EVERY 6 (SIX) HOURS AS NEEDED FOR WHEEZING OR SHORTNESS OF BREATH. 8.5 each  5  ? alum & mag hydroxide-simeth (MAALOX/MYLANTA) 200-200-20 MG/5ML suspension Take 30 mLs by mouth every 4 (four) hours as needed for indigestion. 355 mL 0  ? atorvastatin (LIPITOR) 10 MG tablet Take 1 tablet (10 mg  total) by mouth at bedtime. 90 tablet 0  ? B Complex Vitamins (B COMPLEX PO) Take 1 tablet by mouth daily with breakfast.    ? calcium carbonate (OS-CAL - DOSED IN MG OF ELEMENTAL CALCIUM) 1250 (500 Ca) MG tablet Take 1,250 mg by mouth daily.    ? cyanocobalamin (,VITAMIN B-12,) 1000 MCG/ML injection Inject 1 mL (1,000 mcg total) into the skin every 30 (thirty) days. 1 mL 0  ? dexamethasone (DECADRON) 4 MG tablet Take 2 tablets (8 mg total) by mouth See admin instructions. Take 8 mg by mouth two times a day with a meal starting the day after chemotherapy- for 3 days    ? ezetimibe (ZETIA) 10 MG tablet Take 1 tablet (10 mg total) by mouth at bedtime.    ? famotidine (PEPCID) 20 MG tablet TAKE 1 TABLET (20 MG TOTAL) BY MOUTH 2 (TWO) TIMES DAILY AS NEEDED FOR HEARTBURN OR INDIGESTION. 60 tablet 1  ? feeding supplement (ENSURE ENLIVE / ENSURE PLUS) LIQD Take 237 mLs by mouth 2 (two) times daily between meals. 237 mL 12  ? FLOVENT HFA 44 MCG/ACT inhaler INHALE 2 PUFFS INTO THE LUNGS EVERY 12 (TWELVE) HOURS. 10.6 each 3  ? fluticasone (FLONASE) 50 MCG/ACT nasal spray Place 2 sprays into both nostrils daily.    ? HYDROcodone-acetaminophen (NORCO/VICODIN) 5-325 MG tablet Take 1-2 tablets by mouth every 6 (six) hours as needed for moderate pain or severe pain. 20 tablet 0  ? levocetirizine (XYZAL) 5 MG tablet Take 5 mg by mouth daily.    ? levofloxacin (LEVAQUIN) 500 MG tablet Take 1 tablet (500 mg total) by mouth daily. 7 tablet 0  ? levothyroxine (SYNTHROID) 100 MCG tablet Take 1 tablet (100 mcg total) by mouth every other day. 10 tablet 0  ? levothyroxine (SYNTHROID) 88 MCG tablet Take 1 tablet (88 mcg total) by mouth every other day. 10 tablet 0  ? lidocaine-prilocaine (EMLA) cream Apply 1 application topically as needed (as directed). 30 g 0  ? LORazepam (ATIVAN) 0.5 MG tablet Take 1 tablet (0.5 mg total) by mouth See admin instructions. Take 0.5 mg by mouth at bedtime nightly while hospitalized    ? magic mouthwash  w/lidocaine SOLN Take 5 mLs by mouth 4 (four) times daily as needed for mouth pain. Swish and swallow ?1 part diphenhydramine 12.5 mg per 5 mL elixir (71m), 1 part Maalox (do not substitute Kaopectate) 452m 1 part 2% viscous lidocaine (40 ml), 1 part nystatin 500000 units/ml (4047mand 1 part distilled water (31m24m?Plz dispense 200 ml 200 mL 1  ? magnesium oxide (MAG-OX) 400 (240 Mg) MG tablet Take 1 tablet (400 mg total) by mouth daily.    ? mirtazapine (REMERON) 15 MG tablet TAKE 1 TABLET BY MOUTH EVERYDAY AT BEDTIME 90 tablet 1  ? montelukast (SINGULAIR) 10 MG tablet Take 1 tablet (10 mg total) by mouth daily.    ? Multiple Vitamin (MULTIVITAMIN WITH MINERALS) TABS tablet Take 1 tablet by mouth daily.    ? NEXIUM 20 MG capsule Take 20 mg by mouth 2 (two) times daily before a meal.    ? ondansetron (ZOFRAN) 8 MG tablet TAKE 1 TABLET BY MOUTH 2 TIMES DAILY AS NEEDED FOR NAUSEA/VOMITING STARTING AFTER CHEMO FOR 3 DAYS. 30[place]  tablet 1  ? polyethylene glycol (MIRALAX / GLYCOLAX) 17 g packet Take 17 g by mouth daily. 14 each 0  ? prochlorperazine (COMPAZINE) 10 MG tablet Take 1 tablet (10 mg total) by mouth every 6 (six) hours as needed for vomiting or nausea.    ? Pseudoephedrine-Ibuprofen (ADVIL COLD/SINUS PO) Take 1 tablet by mouth daily as needed (headaches).    ? senna-docusate (SENOKOT-S) 8.6-50 MG tablet Take 1 tablet by mouth 2 (two) times daily as needed for mild constipation.    ? Sodium Chloride-Sodium Bicarb (SODIUM BICARBONATE/SODIUM CHLORIDE) SOLN 1 application by Mouth Rinse route See admin instructions. Use as directed three to four times daily    ? venlafaxine XR (EFFEXOR-XR) 37.5 MG 24 hr capsule TAKE 1 CAPSULE BY MOUTH DAILY WITH BREAKFAST. 90 capsule 1  ? Vitamin D, Ergocalciferol, (DRISDOL) 1.25 MG (50000 UNIT) CAPS capsule Take 1 capsule (50,000 Units total) by mouth every Friday.    ? ?No current facility-administered medications for this visit.  ? ? ?PHYSICAL EXAMINATION: ?ECOG PERFORMANCE  STATUS: 1 - Symptomatic but completely ambulatory ? ?Vitals:  ? 11/17/21 0909  ?BP: 120/63  ?Pulse: 70  ?Resp: 18  ?Temp: 97.7 ?F (36.5 ?C)  ?SpO2: 98%  ? ?Filed Weights  ? 11/17/21 0909  ?Weight: 119 lb (54 kg)  ? ? ?BR

## 2021-11-17 ENCOUNTER — Other Ambulatory Visit: Payer: Self-pay

## 2021-11-17 ENCOUNTER — Inpatient Hospital Stay: Payer: PPO | Attending: Hematology and Oncology | Admitting: Hematology and Oncology

## 2021-11-17 DIAGNOSIS — C50412 Malignant neoplasm of upper-outer quadrant of left female breast: Secondary | ICD-10-CM | POA: Diagnosis not present

## 2021-11-17 DIAGNOSIS — D0511 Intraductal carcinoma in situ of right breast: Secondary | ICD-10-CM | POA: Diagnosis not present

## 2021-11-17 DIAGNOSIS — C8519 Unspecified B-cell lymphoma, extranodal and solid organ sites: Secondary | ICD-10-CM | POA: Insufficient documentation

## 2021-11-17 DIAGNOSIS — C8510 Unspecified B-cell lymphoma, unspecified site: Secondary | ICD-10-CM | POA: Diagnosis not present

## 2021-11-17 DIAGNOSIS — Z170 Estrogen receptor positive status [ER+]: Secondary | ICD-10-CM | POA: Diagnosis not present

## 2021-11-17 DIAGNOSIS — Z79899 Other long term (current) drug therapy: Secondary | ICD-10-CM | POA: Diagnosis not present

## 2021-11-17 MED ORDER — ANASTROZOLE 1 MG PO TABS: 1.0000 mg | ORAL_TABLET | Freq: Every day | ORAL | 3 refills | Status: DC

## 2021-11-17 NOTE — Assessment & Plan Note (Signed)
11/05/2020:Left lumpectomy: Grade 1 IDC, 1.1 cm, intermediate grade DCIS, margins are negative, lymph node -0/1, ER greater than 95%, PR 85%, HER-2 negative, Ki-67 10% ? ? ?Treatment plan: ?1.  Adjuvant radiation therapy followed by ?2. adjuvant antiestrogen therapy ?

## 2021-11-17 NOTE — Assessment & Plan Note (Signed)
-  Colonoscopy performed 11/04/2020-?"An infiltrative and ulcerated partially obstructing large mass was found at 85 cm proximal to the anus. The mass was circumferential." ?-Biopsy of the colon mass consistent with large B-cell lymphoma ?Treated by Dr.Kale with EPOCH-R ?

## 2021-12-05 DIAGNOSIS — E778 Other disorders of glycoprotein metabolism: Secondary | ICD-10-CM | POA: Diagnosis not present

## 2022-01-12 ENCOUNTER — Encounter (HOSPITAL_COMMUNITY): Payer: Self-pay

## 2022-02-01 DIAGNOSIS — Z961 Presence of intraocular lens: Secondary | ICD-10-CM | POA: Diagnosis not present

## 2022-02-07 DIAGNOSIS — I10 Essential (primary) hypertension: Secondary | ICD-10-CM | POA: Diagnosis not present

## 2022-02-07 DIAGNOSIS — E782 Mixed hyperlipidemia: Secondary | ICD-10-CM | POA: Diagnosis not present

## 2022-02-07 DIAGNOSIS — Z79899 Other long term (current) drug therapy: Secondary | ICD-10-CM | POA: Diagnosis not present

## 2022-02-07 DIAGNOSIS — K219 Gastro-esophageal reflux disease without esophagitis: Secondary | ICD-10-CM | POA: Diagnosis not present

## 2022-02-07 DIAGNOSIS — Z08 Encounter for follow-up examination after completed treatment for malignant neoplasm: Secondary | ICD-10-CM | POA: Diagnosis not present

## 2022-02-07 DIAGNOSIS — C8332 Diffuse large B-cell lymphoma, intrathoracic lymph nodes: Secondary | ICD-10-CM | POA: Diagnosis not present

## 2022-02-07 DIAGNOSIS — Z87891 Personal history of nicotine dependence: Secondary | ICD-10-CM | POA: Diagnosis not present

## 2022-02-07 DIAGNOSIS — Z8572 Personal history of non-Hodgkin lymphomas: Secondary | ICD-10-CM | POA: Diagnosis not present

## 2022-02-07 DIAGNOSIS — Z853 Personal history of malignant neoplasm of breast: Secondary | ICD-10-CM | POA: Diagnosis not present

## 2022-02-08 ENCOUNTER — Other Ambulatory Visit: Payer: Self-pay | Admitting: Oncology

## 2022-02-08 DIAGNOSIS — C8333 Diffuse large B-cell lymphoma, intra-abdominal lymph nodes: Secondary | ICD-10-CM | POA: Diagnosis not present

## 2022-02-10 DIAGNOSIS — X32XXXD Exposure to sunlight, subsequent encounter: Secondary | ICD-10-CM | POA: Diagnosis not present

## 2022-02-10 DIAGNOSIS — L570 Actinic keratosis: Secondary | ICD-10-CM | POA: Diagnosis not present

## 2022-02-10 DIAGNOSIS — I872 Venous insufficiency (chronic) (peripheral): Secondary | ICD-10-CM | POA: Diagnosis not present

## 2022-02-23 ENCOUNTER — Other Ambulatory Visit: Payer: Self-pay | Admitting: Nurse Practitioner

## 2022-03-14 DIAGNOSIS — E538 Deficiency of other specified B group vitamins: Secondary | ICD-10-CM | POA: Diagnosis not present

## 2022-03-14 DIAGNOSIS — G620 Drug-induced polyneuropathy: Secondary | ICD-10-CM | POA: Diagnosis not present

## 2022-03-14 DIAGNOSIS — E2839 Other primary ovarian failure: Secondary | ICD-10-CM | POA: Diagnosis not present

## 2022-03-14 DIAGNOSIS — E039 Hypothyroidism, unspecified: Secondary | ICD-10-CM | POA: Diagnosis not present

## 2022-03-14 DIAGNOSIS — T451X5A Adverse effect of antineoplastic and immunosuppressive drugs, initial encounter: Secondary | ICD-10-CM | POA: Diagnosis not present

## 2022-03-14 DIAGNOSIS — R7303 Prediabetes: Secondary | ICD-10-CM | POA: Diagnosis not present

## 2022-03-14 DIAGNOSIS — D649 Anemia, unspecified: Secondary | ICD-10-CM | POA: Diagnosis not present

## 2022-03-14 DIAGNOSIS — Z139 Encounter for screening, unspecified: Secondary | ICD-10-CM | POA: Diagnosis not present

## 2022-03-14 DIAGNOSIS — E559 Vitamin D deficiency, unspecified: Secondary | ICD-10-CM | POA: Diagnosis not present

## 2022-03-14 DIAGNOSIS — E7800 Pure hypercholesterolemia, unspecified: Secondary | ICD-10-CM | POA: Diagnosis not present

## 2022-05-03 DIAGNOSIS — U071 COVID-19: Secondary | ICD-10-CM | POA: Diagnosis not present

## 2022-05-04 DIAGNOSIS — E039 Hypothyroidism, unspecified: Secondary | ICD-10-CM | POA: Diagnosis not present

## 2022-05-04 DIAGNOSIS — K219 Gastro-esophageal reflux disease without esophagitis: Secondary | ICD-10-CM | POA: Diagnosis not present

## 2022-05-04 DIAGNOSIS — I2510 Atherosclerotic heart disease of native coronary artery without angina pectoris: Secondary | ICD-10-CM | POA: Diagnosis not present

## 2022-05-09 ENCOUNTER — Telehealth: Payer: Self-pay | Admitting: Hematology and Oncology

## 2022-05-09 NOTE — Telephone Encounter (Signed)
Scheduled appointment per provider PAL. Patient is aware of the changes made to her upcoming appointment.  

## 2022-05-10 DIAGNOSIS — C8333 Diffuse large B-cell lymphoma, intra-abdominal lymph nodes: Secondary | ICD-10-CM | POA: Diagnosis not present

## 2022-05-10 DIAGNOSIS — R197 Diarrhea, unspecified: Secondary | ICD-10-CM | POA: Diagnosis not present

## 2022-05-10 DIAGNOSIS — Z853 Personal history of malignant neoplasm of breast: Secondary | ICD-10-CM | POA: Diagnosis not present

## 2022-05-10 DIAGNOSIS — Z08 Encounter for follow-up examination after completed treatment for malignant neoplasm: Secondary | ICD-10-CM | POA: Diagnosis not present

## 2022-05-10 DIAGNOSIS — Z8572 Personal history of non-Hodgkin lymphomas: Secondary | ICD-10-CM | POA: Diagnosis not present

## 2022-05-10 DIAGNOSIS — C8332 Diffuse large B-cell lymphoma, intrathoracic lymph nodes: Secondary | ICD-10-CM | POA: Diagnosis not present

## 2022-05-19 ENCOUNTER — Ambulatory Visit: Payer: PPO | Admitting: Hematology and Oncology

## 2022-06-01 NOTE — Progress Notes (Signed)
Patient Care Team: Hamrick, Lorin Mercy, MD as PCP - General (Family Medicine) Juanito Doom, MD as Consulting Physician (Pulmonary Disease) Elsie Saas, MD as Consulting Physician (Orthopedic Surgery) Allyn Kenner, MD as Consulting Physician (Dermatology) Katy Apo, MD as Consulting Physician (Ophthalmology) Jovita Kussmaul, MD as Consulting Physician (General Surgery) Nicholas Lose, MD as Consulting Physician (Hematology and Oncology) Garlan Fair, MD as Consulting Physician (Gastroenterology) Delice Bison Charlestine Massed, NP as Nurse Practitioner (Hematology and Oncology)  DIAGNOSIS:  Encounter Diagnosis  Name Primary?   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 Biopsy showed invasive and in situ ductal carcinoma, HER-2 equivocal by IHC (2+), negative by FISH (ratio 1.59), ER+ >95%, PR+ 85%, Ki67 10%.    Malignant neoplasm of left breast in female, estrogen receptor positive ([place])  10/12/2020 Initial Diagnosis   Malignant neoplasm of left breast in female, estrogen receptor positive ([place])   10/12/2020 Cancer Staging   Staging form: Breast, AJCC 8th Edition - Clinical stage from 10/12/2020: Stage IA (cT1b, cN0, cM0, G2, ER+, PR+, HER2-) - Signed by Nicholas Lose, MD on 10/15/2020 Stage prefix: Initial diagnosis   11/05/2020 Surgery   Left lumpectomy: Grade 1 IDC, 1.1 cm, intermediate grade DCIS, margins are  negative, lymph node -0/1, ER greater than 95%, PR 85%, HER-2 negative, Ki-67 10%   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 with above-mentioned history of right breast DCIS and  left breast cancer. She presents to the clinic for a follow-up. She reports some joint problems. Overall she is tolerating the anastrozole.  She reports some lower extremity knee pain but otherwise tolerating it extremely well.  Denies any lumps or nodules in the breast. She is now seeing oncology in [place] for her lymphoma.  It appears that they are watching her very closely and so far she appears to be in remission.  She had extensive abdominal complications which led to an ileostomy which was later corrected back.    ALLERGIES:  is allergic to ace inhibitors, advair diskus [fluticasone-salmeterol], neosporin [neomycin-polymyxin-gramicidin], olmesartan, and tape.  MEDICATIONS:  Current Outpatient Medications  Medication Sig Dispense Refill   anastrozole (ARIMIDEX) 1 MG tablet Take 1 tablet (1 mg total) by mouth daily. 90 tablet 3   atorvastatin (LIPITOR) 10 MG tablet Take 1 tablet (10 mg total) by mouth at bedtime. 90 tablet 0   B Complex Vitamins (B COMPLEX PO) Take 1 tablet by mouth daily with breakfast.     calcium carbonate (OS-CAL - DOSED IN MG OF  ELEMENTAL CALCIUM) 1250 (500 Ca) MG tablet Take 1,250 mg by mouth daily.     ezetimibe (ZETIA) 10 MG tablet Take 1 tablet (10 mg total) by mouth at bedtime.     feeding supplement (ENSURE ENLIVE / ENSURE PLUS) LIQD Take 237 mLs by mouth 2 (two) times daily between meals. 237 mL 12    levothyroxine (SYNTHROID) 100 MCG tablet Take 1 tablet (100 mcg total) by mouth every other day. 10 tablet 0   levothyroxine (SYNTHROID) 88 MCG tablet Take 1 tablet (88 mcg total) by mouth every other day. 10 tablet 0   magic mouthwash w/lidocaine SOLN Take 5 mLs by mouth 4 (four) times daily as needed for mouth pain. Swish and swallow 1 part diphenhydramine 12.5 mg per 5 mL elixir (40ml), 1 part Maalox (do not substitute Kaopectate) 40ml, 1 part 2% viscous lidocaine (40 ml), 1 part nystatin 500000 units/ml (40ml) and 1 part distilled water (40ml). Plz dispense 200 ml 200 mL 1   Multiple Vitamin (MULTIVITAMIN WITH MINERALS) TABS tablet Take 1 tablet by mouth daily.     ondansetron (ZOFRAN) 8 MG tablet TAKE 1 TABLET BY MOUTH 2 TIMES DAILY AS NEEDED FOR NAUSEA/VOMITING STARTING AFTER CHEMO FOR 3 DAYS. 30 tablet 1   Vitamin D, Ergocalciferol, (DRISDOL) 1.25 MG (50000 UNIT) CAPS capsule Take 1 capsule (50,000 Units total) by mouth every Friday.     FLOVENT HFA 44 MCG/ACT inhaler INHALE 2 PUFFS INTO THE LUNGS EVERY 12 (TWELVE) HOURS. 10.6 each 3   No current facility-administered medications for this visit.    PHYSICAL EXAMINATION: ECOG PERFORMANCE STATUS: 1 - Symptomatic but completely ambulatory  Vitals:   06/05/22 1409  BP: (!) 152/86  Pulse: 89  Resp: 18  Temp: 97.9 F (36.6 C)  SpO2: 98%   Filed Weights   06/05/22 1409  Weight: 140 lb 8 oz (63.7 kg)      LABORATORY DATA:  I have reviewed the data as listed    Latest Ref Rng & Units 04/08/2021    3:14 PM 04/05/2021    2:33 PM 03/17/2021    9:20 AM  CMP  Glucose 70 - 99 mg/dL 65  89  83   BUN 8 - 23 mg/dL 13  15  10   Creatinine 0.44 - 1.00 mg/dL 0.53  0.62  0.59   Sodium 135 - 145 mmol/L 138  135  142   Potassium 3.5 - 5.1 mmol/L 3.5  4.1  3.2   Chloride 98 - 111 mmol/L 103  102  107   CO2 22 - 32 mmol/L 23  23  27   Calcium 8.9 - 10.3 mg/dL 8.4  8.5  8.0   Total Protein 6.5 - 8.1 g/dL 5.5  5.3  5.2   Total Bilirubin 0.3 -  1.2 mg/dL 0.9  0.7  0.4   Alkaline Phos 38 - 126 U/L 82  92  114   AST 15 - 41 U/L 19  21  22   ALT 0 - 44 U/L 17  14  24     Lab Results  Component Value Date   WBC 6.5 04/08/2021   HGB 11.5 (L) 04/08/2021   HCT 36.1 04/08/2021   MCV 93.8 04/08/2021   PLT 310 04/08/2021   NEUTROABS 4.1 04/08/2021    ASSESSMENT & PLAN:  Malignant neoplasm of left breast in female, estrogen receptor positive ([facility]) 11/05/2020:Left lumpectomy: Grade 1 IDC, 1.1 cm, intermediate grade DCIS, margins are negative, lymph node -0/1, ER greater   than 95%, PR 85%, HER-2 negative, Ki-67 10%    Treatment plan: 1.  Adjuvant radiation therapy (since it was delayed by a year I do not think that there would be much benefit from adjuvant radiation) 2. adjuvant antiestrogen therapy with anastrozole started 11/17/2021 ---------------------------------------------------------------------------------------------- Anastrozole toxicities: Tolerating it well without any problems or concerns.  Breast cancer surveillance: 1.  Breast exam: 06/05/2022: Benign 2. mammogram 09/28/2021: Benign breast density category B  Aggressive B-cell lymphoma status post chemotherapy.  Being watched by oncology in [place] Return to clinic in 1 year for follow-up    No orders of the defined types were placed in this encounter.  The patient has a good understanding of the overall plan. she agrees with it. she will call with any problems that may develop before the next visit here. Total time spent: 30 mins including face to face time and time spent for planning, charting and co-ordination of care   Harriette Ohara, MD 06/05/22    I Gardiner Coins am scribing for Dr. Lindi Adie  I have reviewed the above documentation for accuracy and completeness, and I agree with the above.

## 2022-06-05 ENCOUNTER — Other Ambulatory Visit: Payer: Self-pay

## 2022-06-05 ENCOUNTER — Inpatient Hospital Stay: Payer: PPO | Attending: Hematology and Oncology | Admitting: Hematology and Oncology

## 2022-06-05 DIAGNOSIS — C50412 Malignant neoplasm of upper-outer quadrant of left female breast: Secondary | ICD-10-CM | POA: Diagnosis not present

## 2022-06-05 DIAGNOSIS — Z86000 Personal history of in-situ neoplasm of breast: Secondary | ICD-10-CM | POA: Insufficient documentation

## 2022-06-05 DIAGNOSIS — C8510 Unspecified B-cell lymphoma, unspecified site: Secondary | ICD-10-CM | POA: Diagnosis not present

## 2022-06-05 DIAGNOSIS — Z170 Estrogen receptor positive status [ER+]: Secondary | ICD-10-CM | POA: Diagnosis not present

## 2022-06-05 NOTE — Assessment & Plan Note (Addendum)
11/05/2020:Left lumpectomy: Grade 1 IDC, 1.1 cm, intermediate grade DCIS, margins are negative, lymph node -0/1, ER greater than 95%, PR 85%, HER-2 negative, Ki-67 10%   Treatment plan: 1.Adjuvant radiation therapy (since it was delayed by a year I do not think that there would be much benefit from adjuvant radiation) 2.adjuvant antiestrogen therapy with anastrozole started 11/17/2021 ---------------------------------------------------------------------------------------------- Anastrozole toxicities:  Breast cancer surveillance: 1.  Breast exam: 06/05/2022: Benign 2. mammogram 09/28/2021: Benign breast density category B  Return to clinic in 1 year for follow-up

## 2022-06-06 DIAGNOSIS — M654 Radial styloid tenosynovitis [de Quervain]: Secondary | ICD-10-CM | POA: Diagnosis not present

## 2022-06-06 DIAGNOSIS — I10 Essential (primary) hypertension: Secondary | ICD-10-CM | POA: Diagnosis not present

## 2022-07-11 DIAGNOSIS — L821 Other seborrheic keratosis: Secondary | ICD-10-CM | POA: Diagnosis not present

## 2022-07-11 DIAGNOSIS — L905 Scar conditions and fibrosis of skin: Secondary | ICD-10-CM | POA: Diagnosis not present

## 2022-07-11 DIAGNOSIS — L570 Actinic keratosis: Secondary | ICD-10-CM | POA: Diagnosis not present

## 2022-07-11 DIAGNOSIS — X32XXXD Exposure to sunlight, subsequent encounter: Secondary | ICD-10-CM | POA: Diagnosis not present

## 2022-07-11 DIAGNOSIS — D485 Neoplasm of uncertain behavior of skin: Secondary | ICD-10-CM | POA: Diagnosis not present

## 2022-08-15 DIAGNOSIS — L603 Nail dystrophy: Secondary | ICD-10-CM | POA: Diagnosis not present

## 2022-08-15 DIAGNOSIS — B351 Tinea unguium: Secondary | ICD-10-CM | POA: Diagnosis not present

## 2022-08-30 ENCOUNTER — Other Ambulatory Visit: Payer: Self-pay | Admitting: Hematology and Oncology

## 2022-08-30 DIAGNOSIS — Z9889 Other specified postprocedural states: Secondary | ICD-10-CM

## 2022-09-08 DIAGNOSIS — Z853 Personal history of malignant neoplasm of breast: Secondary | ICD-10-CM | POA: Diagnosis not present

## 2022-09-08 DIAGNOSIS — Z08 Encounter for follow-up examination after completed treatment for malignant neoplasm: Secondary | ICD-10-CM | POA: Diagnosis not present

## 2022-09-08 DIAGNOSIS — C8332 Diffuse large B-cell lymphoma, intrathoracic lymph nodes: Secondary | ICD-10-CM | POA: Diagnosis not present

## 2022-09-08 DIAGNOSIS — C8333 Diffuse large B-cell lymphoma, intra-abdominal lymph nodes: Secondary | ICD-10-CM | POA: Diagnosis not present

## 2022-09-08 DIAGNOSIS — Z8572 Personal history of non-Hodgkin lymphomas: Secondary | ICD-10-CM | POA: Diagnosis not present

## 2022-09-08 DIAGNOSIS — R197 Diarrhea, unspecified: Secondary | ICD-10-CM | POA: Diagnosis not present

## 2022-09-29 ENCOUNTER — Ambulatory Visit
Admission: RE | Admit: 2022-09-29 | Discharge: 2022-09-29 | Disposition: A | Discharge status: Home / Self Care | Payer: PPO | Source: Ambulatory Visit | Attending: Hematology and Oncology | Admitting: Hematology and Oncology

## 2022-09-29 DIAGNOSIS — Z853 Personal history of malignant neoplasm of breast: Secondary | ICD-10-CM | POA: Diagnosis not present

## 2022-09-29 DIAGNOSIS — Z9889 Other specified postprocedural states: Secondary | ICD-10-CM

## 2022-10-06 ENCOUNTER — Other Ambulatory Visit: Payer: Self-pay | Admitting: Hematology and Oncology

## 2022-10-27 DIAGNOSIS — E538 Deficiency of other specified B group vitamins: Secondary | ICD-10-CM | POA: Diagnosis not present

## 2022-10-27 DIAGNOSIS — E039 Hypothyroidism, unspecified: Secondary | ICD-10-CM | POA: Diagnosis not present

## 2022-10-27 DIAGNOSIS — Z79899 Other long term (current) drug therapy: Secondary | ICD-10-CM | POA: Diagnosis not present

## 2022-10-27 DIAGNOSIS — E559 Vitamin D deficiency, unspecified: Secondary | ICD-10-CM | POA: Diagnosis not present

## 2022-10-27 DIAGNOSIS — E7800 Pure hypercholesterolemia, unspecified: Secondary | ICD-10-CM | POA: Diagnosis not present

## 2022-11-01 DIAGNOSIS — E7800 Pure hypercholesterolemia, unspecified: Secondary | ICD-10-CM | POA: Diagnosis not present

## 2022-11-01 DIAGNOSIS — E039 Hypothyroidism, unspecified: Secondary | ICD-10-CM | POA: Diagnosis not present

## 2022-11-01 DIAGNOSIS — M25561 Pain in right knee: Secondary | ICD-10-CM | POA: Diagnosis not present

## 2022-11-01 DIAGNOSIS — T451X5A Adverse effect of antineoplastic and immunosuppressive drugs, initial encounter: Secondary | ICD-10-CM | POA: Diagnosis not present

## 2022-11-01 DIAGNOSIS — G620 Drug-induced polyneuropathy: Secondary | ICD-10-CM | POA: Diagnosis not present

## 2022-11-01 DIAGNOSIS — E559 Vitamin D deficiency, unspecified: Secondary | ICD-10-CM | POA: Diagnosis not present

## 2022-11-01 DIAGNOSIS — Z9181 History of falling: Secondary | ICD-10-CM | POA: Diagnosis not present

## 2022-11-01 DIAGNOSIS — I700 Atherosclerosis of aorta: Secondary | ICD-10-CM | POA: Diagnosis not present

## 2022-11-01 DIAGNOSIS — C8510 Unspecified B-cell lymphoma, unspecified site: Secondary | ICD-10-CM | POA: Diagnosis not present

## 2022-11-01 DIAGNOSIS — Z1331 Encounter for screening for depression: Secondary | ICD-10-CM | POA: Diagnosis not present

## 2022-11-01 DIAGNOSIS — Z6823 Body mass index (BMI) 23.0-23.9, adult: Secondary | ICD-10-CM | POA: Diagnosis not present

## 2022-11-01 DIAGNOSIS — E538 Deficiency of other specified B group vitamins: Secondary | ICD-10-CM | POA: Diagnosis not present

## 2022-11-02 DIAGNOSIS — M25561 Pain in right knee: Secondary | ICD-10-CM | POA: Diagnosis not present

## 2022-11-02 DIAGNOSIS — S8991XA Unspecified injury of right lower leg, initial encounter: Secondary | ICD-10-CM | POA: Diagnosis not present

## 2022-11-14 DIAGNOSIS — S8991XA Unspecified injury of right lower leg, initial encounter: Secondary | ICD-10-CM | POA: Diagnosis not present

## 2022-11-14 DIAGNOSIS — M25561 Pain in right knee: Secondary | ICD-10-CM | POA: Diagnosis not present

## 2022-11-30 DIAGNOSIS — M25561 Pain in right knee: Secondary | ICD-10-CM | POA: Diagnosis not present

## 2022-12-22 DIAGNOSIS — C8333 Diffuse large B-cell lymphoma, intra-abdominal lymph nodes: Secondary | ICD-10-CM | POA: Diagnosis not present

## 2022-12-22 DIAGNOSIS — C8332 Diffuse large B-cell lymphoma, intrathoracic lymph nodes: Secondary | ICD-10-CM | POA: Diagnosis not present

## 2022-12-22 DIAGNOSIS — Z853 Personal history of malignant neoplasm of breast: Secondary | ICD-10-CM | POA: Diagnosis not present

## 2022-12-22 DIAGNOSIS — R197 Diarrhea, unspecified: Secondary | ICD-10-CM | POA: Diagnosis not present

## 2022-12-22 DIAGNOSIS — Z08 Encounter for follow-up examination after completed treatment for malignant neoplasm: Secondary | ICD-10-CM | POA: Diagnosis not present

## 2022-12-22 DIAGNOSIS — Z8572 Personal history of non-Hodgkin lymphomas: Secondary | ICD-10-CM | POA: Diagnosis not present

## 2022-12-27 DIAGNOSIS — E039 Hypothyroidism, unspecified: Secondary | ICD-10-CM | POA: Diagnosis not present

## 2023-01-10 DIAGNOSIS — K1329 Other disturbances of oral epithelium, including tongue: Secondary | ICD-10-CM | POA: Diagnosis not present

## 2023-01-15 DIAGNOSIS — I10 Essential (primary) hypertension: Secondary | ICD-10-CM | POA: Diagnosis not present

## 2023-01-15 DIAGNOSIS — Z6823 Body mass index (BMI) 23.0-23.9, adult: Secondary | ICD-10-CM | POA: Diagnosis not present

## 2023-01-15 DIAGNOSIS — M159 Polyosteoarthritis, unspecified: Secondary | ICD-10-CM | POA: Diagnosis not present

## 2023-01-20 IMAGING — CT CT ABD-PELV W/ CM
2 of 5 series · 11 of 46 positions shown, 12 images · IV contrast (OMNIPAQUE 300)
Comparison: No priors are available for comparison.
COMPARISON: No priors are available for comparison.

Addendum:
CLINICAL DATA: Suspected diverticulitis in a patient with history
of abdominal pain and diarrhea for 2 days

EXAM:
CT ABDOMEN AND PELVIS WITH CONTRAST
TECHNIQUE: Multidetector CT imaging of the abdomen and pelvis was performed
using the standard protocol following bolus administration of
intravenous contrast.
CONTRAST:  100mL OMNIPAQUE IOHEXOL 300 MG/ML  SOLN

[Series 2: axial st · axial · 0.78mm/px · z∈[+1107,+1517]mm · 8 of 96 slices shown, 9 images]
[im 7/96  soft-tissue]
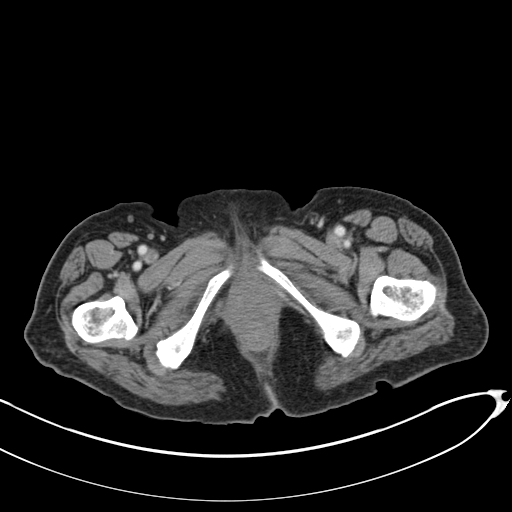
[im 7/96  bone]
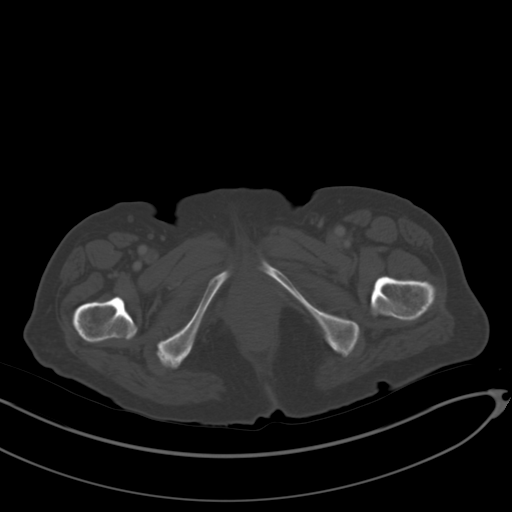
[im 21/96  soft-tissue]
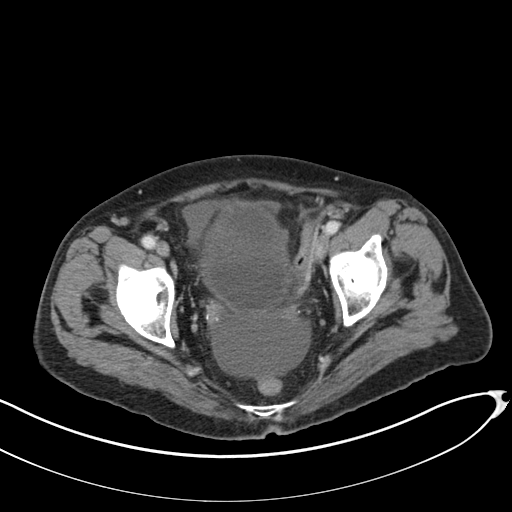
[im 34/96  soft-tissue]
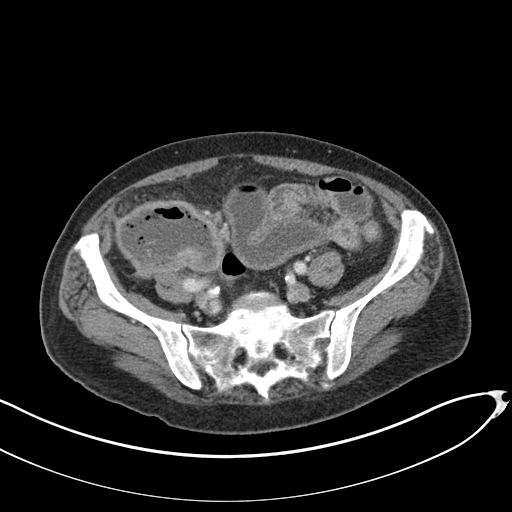
[im 41/96  soft-tissue]
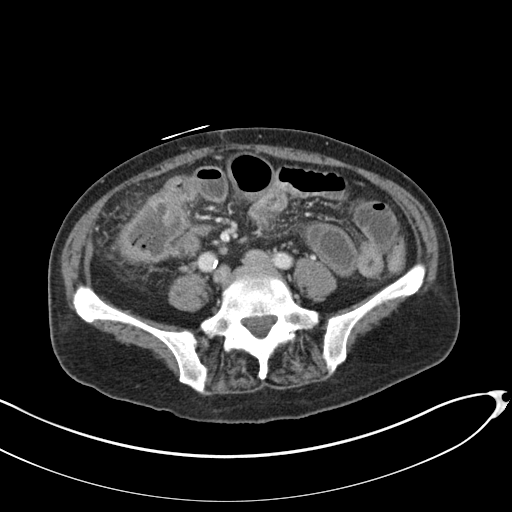
[im 55/96  soft-tissue]
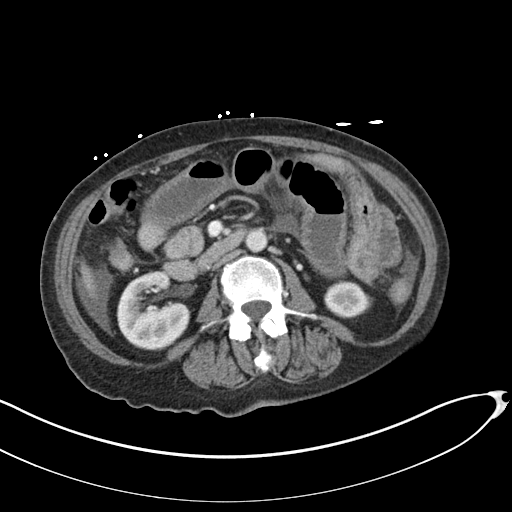
[im 62/96  soft-tissue]
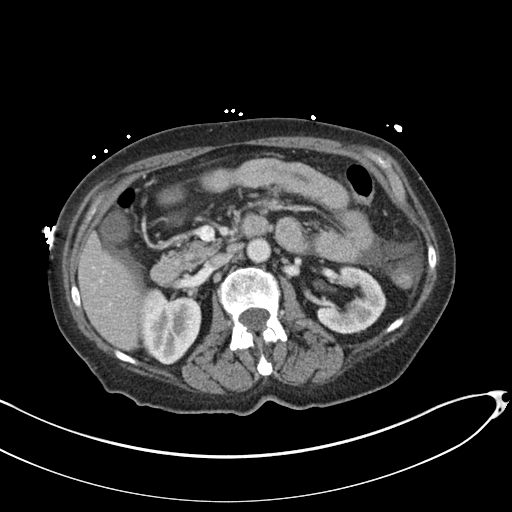
[im 75/96  soft-tissue]
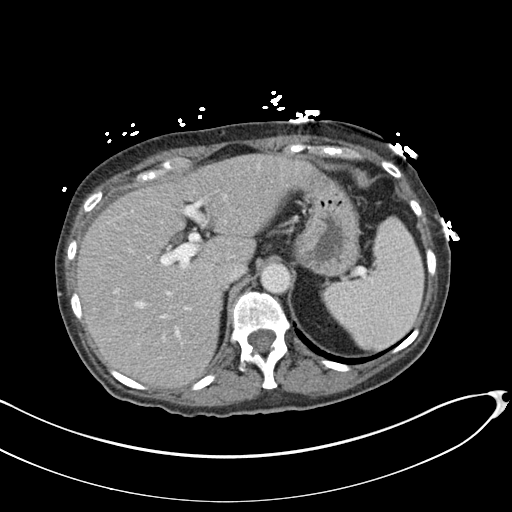
[im 89/96  soft-tissue]
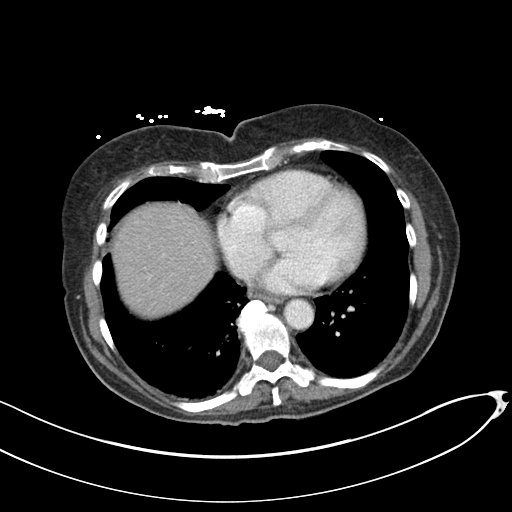

[Series 4: coronal st · coronal · 0.77mm/px · 3 of 131 slices shown]
[im 44/131  soft-tissue]
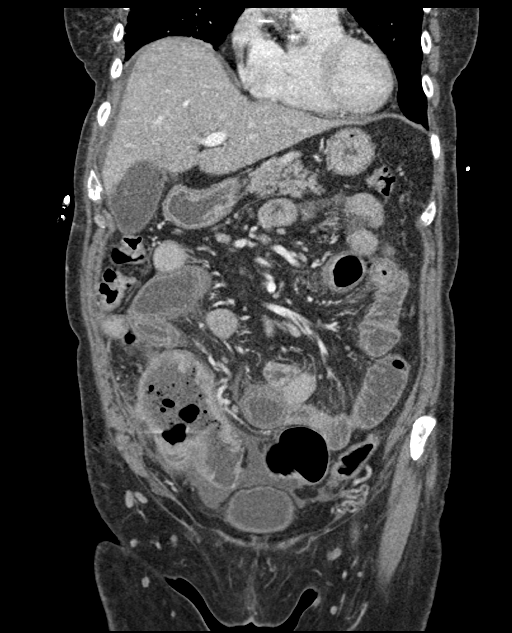
[im 58/131  soft-tissue]
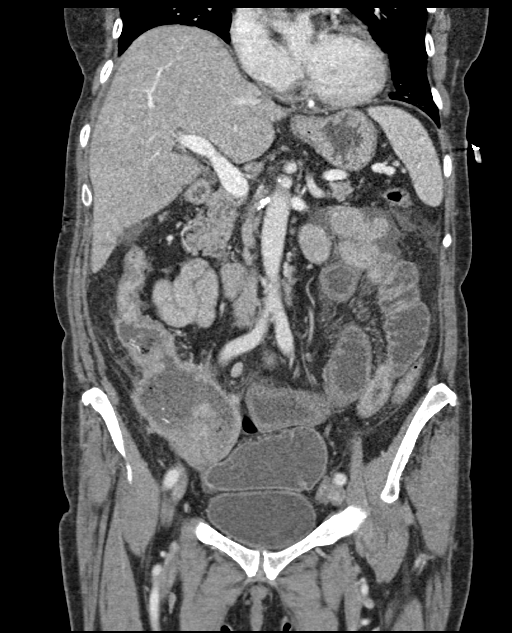
[im 73/131  soft-tissue]
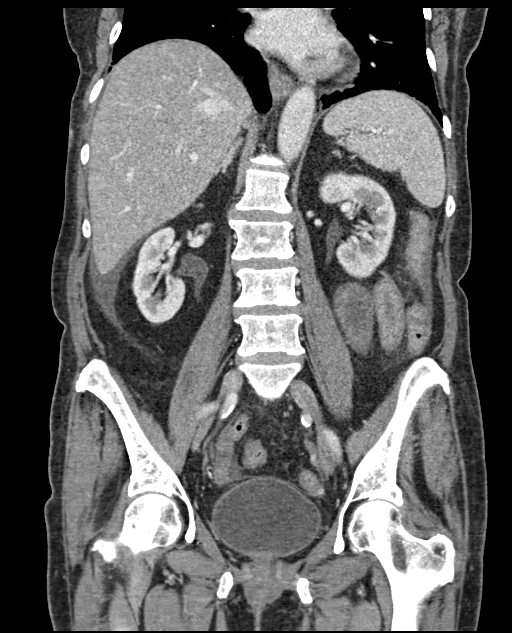

[11 of 46 positions shown; findings below may reference images not displayed]

FINDINGS: Lower chest: Basilar atelectasis. No dense consolidation. No sign of
pleural effusion. Small pulmonary nodule RIGHT lower lobe (image
seven, series 6) 7 mm.

Hepatobiliary: Suggested hepatic steatosis without focal, suspicious
hepatic lesion. Portal vein is patent. SMV is patent. No
hyperenhancement of the gallbladder wall. Fluid near the gallbladder
but mainly localized to the perihepatic space and RIGHT pericolic
gutter about inflammatory changes involving bowel in the RIGHT
hemiabdomen. No biliary duct dilation.

Pancreas: Normal, without mass, inflammation or ductal dilatation.

Spleen: Normal

Adrenals/Urinary Tract: Normal appearance of the adrenal glands.

Symmetric renal enhancement. No signs of hydronephrosis. Smooth
renal contours.

Stomach/Bowel: Stomach under distended limiting assessment. No acute
gastric process. Mural stratification of numerous small bowel loops
beginning in the distal jejunum and extending through the ileum.

Irregular collection without discernible bowel wall in the RIGHT
lower quadrant in communication with colon and the ileum at 3
levels. No normal ileocecal valve could be demonstrated. Mural
stratification is present within bowel loops leading into this
collection. The collection measures 8.4 x 5.4 cm in greatest axial
dimension an approximately 9 cm greatest craniocaudal span. There is
abundant surrounding stranding and irregular peripheral enhancement
of this area.

Bowel upstream and around this showing variable areas of dilation
and collapse with mural stratification. A C-shaped loop of ileum in
the RIGHT lower quadrant best seen on image 71 of series 2
communicates at 2 levels, along the inferior and anterior margin of
the collection.

A second loop of bowel communicates along the anterolateral margin
cephalad to these other areas of communication along the superior
margin of the collection.

Long segment mural stratification involving the distal jejunum and
ileum is present with areas of suspected "skip type lesions" as
well. There is no free air. Interloop fluid is present in the upper
abdomen in the LEFT upper quadrant and in the lower abdomen around
inflamed bowel loops and is associated with extensive mesenteric
edema. Colon is collapsed.

Vascular/Lymphatic: Aortic atherosclerosis both calcified and
noncalcified. No aneurysmal dilation. Mesenteric vessels including
the SMV or grossly patent. No mesenteric adenopathy by size criteria
with numerous small lymph nodes however in the RIGHT lower quadrant
mesentery. Irregular margin of the area of concern in the RIGHT
lower quadrant best seen on coronal images with areas measuring up
to 11 mm along the margin of this collection.

Reproductive: Post hysterectomy. Masslike area/collection contiguous
with the RIGHT adnexa.

Other: Small volume ascites both in the pelvis, LEFT hemiabdomen and
along the RIGHT hemi liver.

Musculoskeletal: No acute or destructive bone process. Spinal
degenerative changes.
IMPRESSION: 1. Irregular collection without discernible bowel wall in the area
of the cecum in the RIGHT lower quadrant with complex fistulous
network, associated with signs of severe enteritis and obstruction.
2. Findings could be related to severe sequela of inflammatory bowel
disease based on appearance. Bowel compromise leading to severe
enteritis is another differential consideration along with neoplasm.
3. Ascites and interloop fluid as described in the setting of marked
enteritis.
4. Masslike area/collection contiguous with the RIGHT adnexa.
5. 7 mm RIGHT lower lobe pulmonary nodule without comparison is.
Short interval follow-up is suggested in this patient to [facility]
society guidelines do not apply. Follow-up within 3 months or
dedicated evaluation of the chest if neoplasm is discovered on
further assessment.
6. Suggested hepatic steatosis without focal, suspicious hepatic
lesion.
7. Aortic atherosclerosis.

Aortic Atherosclerosis (9SXA3-DDP.P).

ADDENDUM:
These results were called by telephone at the time of interpretation
on 10/01/2020 at [DATE] to provider JOHANNE HUTTER , who verbally
acknowledged these results.

*** End of Addendum ***
FINDINGS: Lower chest: Basilar atelectasis. No dense consolidation. No sign of
pleural effusion. Small pulmonary nodule RIGHT lower lobe (image
seven, series 6) 7 mm.

Hepatobiliary: Suggested hepatic steatosis without focal, suspicious
hepatic lesion. Portal vein is patent. SMV is patent. No
hyperenhancement of the gallbladder wall. Fluid near the gallbladder
but mainly localized to the perihepatic space and RIGHT pericolic
gutter about inflammatory changes involving bowel in the RIGHT
hemiabdomen. No biliary duct dilation.

Pancreas: Normal, without mass, inflammation or ductal dilatation.

Spleen: Normal

Adrenals/Urinary Tract: Normal appearance of the adrenal glands.

Symmetric renal enhancement. No signs of hydronephrosis. Smooth
renal contours.

Stomach/Bowel: Stomach under distended limiting assessment. No acute
gastric process. Mural stratification of numerous small bowel loops
beginning in the distal jejunum and extending through the ileum.

Irregular collection without discernible bowel wall in the RIGHT
lower quadrant in communication with colon and the ileum at 3
levels. No normal ileocecal valve could be demonstrated. Mural
stratification is present within bowel loops leading into this
collection. The collection measures 8.4 x 5.4 cm in greatest axial
dimension an approximately 9 cm greatest craniocaudal span. There is
abundant surrounding stranding and irregular peripheral enhancement
of this area.

Bowel upstream and around this showing variable areas of dilation
and collapse with mural stratification. A C-shaped loop of ileum in
the RIGHT lower quadrant best seen on image 71 of series 2
communicates at 2 levels, along the inferior and anterior margin of
the collection.

A second loop of bowel communicates along the anterolateral margin
cephalad to these other areas of communication along the superior
margin of the collection.

Long segment mural stratification involving the distal jejunum and
ileum is present with areas of suspected "skip type lesions" as
well. There is no free air. Interloop fluid is present in the upper
abdomen in the LEFT upper quadrant and in the lower abdomen around
inflamed bowel loops and is associated with extensive mesenteric
edema. Colon is collapsed.

Vascular/Lymphatic: Aortic atherosclerosis both calcified and
noncalcified. No aneurysmal dilation. Mesenteric vessels including
the SMV or grossly patent. No mesenteric adenopathy by size criteria
with numerous small lymph nodes however in the RIGHT lower quadrant
mesentery. Irregular margin of the area of concern in the RIGHT
lower quadrant best seen on coronal images with areas measuring up
to 11 mm along the margin of this collection.

Reproductive: Post hysterectomy. Masslike area/collection contiguous
with the RIGHT adnexa.

Other: Small volume ascites both in the pelvis, LEFT hemiabdomen and
along the RIGHT hemi liver.

Musculoskeletal: No acute or destructive bone process. Spinal
degenerative changes.
IMPRESSION: 1. Irregular collection without discernible bowel wall in the area
of the cecum in the RIGHT lower quadrant with complex fistulous
network, associated with signs of severe enteritis and obstruction.
2. Findings could be related to severe sequela of inflammatory bowel
disease based on appearance. Bowel compromise leading to severe
enteritis is another differential consideration along with neoplasm.
3. Ascites and interloop fluid as described in the setting of marked
enteritis.
4. Masslike area/collection contiguous with the RIGHT adnexa.
5. 7 mm RIGHT lower lobe pulmonary nodule without comparison is.
Short interval follow-up is suggested in this patient to [facility]
society guidelines do not apply. Follow-up within 3 months or
dedicated evaluation of the chest if neoplasm is discovered on
further assessment.
6. Suggested hepatic steatosis without focal, suspicious hepatic
lesion.
7. Aortic atherosclerosis.

Aortic Atherosclerosis (9SXA3-DDP.P).

## 2023-01-30 DIAGNOSIS — L570 Actinic keratosis: Secondary | ICD-10-CM | POA: Diagnosis not present

## 2023-01-30 DIAGNOSIS — X32XXXD Exposure to sunlight, subsequent encounter: Secondary | ICD-10-CM | POA: Diagnosis not present

## 2023-03-12 DIAGNOSIS — Z961 Presence of intraocular lens: Secondary | ICD-10-CM | POA: Diagnosis not present

## 2023-03-12 DIAGNOSIS — H35371 Puckering of macula, right eye: Secondary | ICD-10-CM | POA: Diagnosis not present

## 2023-03-29 DIAGNOSIS — Z08 Encounter for follow-up examination after completed treatment for malignant neoplasm: Secondary | ICD-10-CM | POA: Diagnosis not present

## 2023-03-29 DIAGNOSIS — R197 Diarrhea, unspecified: Secondary | ICD-10-CM | POA: Diagnosis not present

## 2023-03-29 DIAGNOSIS — Z853 Personal history of malignant neoplasm of breast: Secondary | ICD-10-CM | POA: Diagnosis not present

## 2023-03-29 DIAGNOSIS — C8332 Diffuse large B-cell lymphoma, intrathoracic lymph nodes: Secondary | ICD-10-CM | POA: Diagnosis not present

## 2023-03-29 DIAGNOSIS — Z8572 Personal history of non-Hodgkin lymphomas: Secondary | ICD-10-CM | POA: Diagnosis not present

## 2023-03-29 DIAGNOSIS — C8333 Diffuse large B-cell lymphoma, intra-abdominal lymph nodes: Secondary | ICD-10-CM | POA: Diagnosis not present

## 2023-04-17 DIAGNOSIS — R197 Diarrhea, unspecified: Secondary | ICD-10-CM | POA: Diagnosis not present

## 2023-04-26 ENCOUNTER — Telehealth: Payer: Self-pay | Admitting: Hematology and Oncology

## 2023-04-26 NOTE — Telephone Encounter (Signed)
Patient aware of rescheduled appointment times/dates

## 2023-04-27 DIAGNOSIS — E559 Vitamin D deficiency, unspecified: Secondary | ICD-10-CM | POA: Diagnosis not present

## 2023-04-27 DIAGNOSIS — E039 Hypothyroidism, unspecified: Secondary | ICD-10-CM | POA: Diagnosis not present

## 2023-04-27 DIAGNOSIS — E7800 Pure hypercholesterolemia, unspecified: Secondary | ICD-10-CM | POA: Diagnosis not present

## 2023-04-27 DIAGNOSIS — E538 Deficiency of other specified B group vitamins: Secondary | ICD-10-CM | POA: Diagnosis not present

## 2023-04-27 DIAGNOSIS — R197 Diarrhea, unspecified: Secondary | ICD-10-CM | POA: Diagnosis not present

## 2023-04-27 DIAGNOSIS — Z79899 Other long term (current) drug therapy: Secondary | ICD-10-CM | POA: Diagnosis not present

## 2023-05-04 DIAGNOSIS — E559 Vitamin D deficiency, unspecified: Secondary | ICD-10-CM | POA: Diagnosis not present

## 2023-05-04 DIAGNOSIS — G620 Drug-induced polyneuropathy: Secondary | ICD-10-CM | POA: Diagnosis not present

## 2023-05-04 DIAGNOSIS — I700 Atherosclerosis of aorta: Secondary | ICD-10-CM | POA: Diagnosis not present

## 2023-05-04 DIAGNOSIS — C8510 Unspecified B-cell lymphoma, unspecified site: Secondary | ICD-10-CM | POA: Diagnosis not present

## 2023-05-04 DIAGNOSIS — R197 Diarrhea, unspecified: Secondary | ICD-10-CM | POA: Diagnosis not present

## 2023-05-04 DIAGNOSIS — E538 Deficiency of other specified B group vitamins: Secondary | ICD-10-CM | POA: Diagnosis not present

## 2023-05-04 DIAGNOSIS — T451X5A Adverse effect of antineoplastic and immunosuppressive drugs, initial encounter: Secondary | ICD-10-CM | POA: Diagnosis not present

## 2023-05-04 DIAGNOSIS — Z139 Encounter for screening, unspecified: Secondary | ICD-10-CM | POA: Diagnosis not present

## 2023-05-04 DIAGNOSIS — E039 Hypothyroidism, unspecified: Secondary | ICD-10-CM | POA: Diagnosis not present

## 2023-05-04 DIAGNOSIS — E7800 Pure hypercholesterolemia, unspecified: Secondary | ICD-10-CM | POA: Diagnosis not present

## 2023-05-04 DIAGNOSIS — Z1331 Encounter for screening for depression: Secondary | ICD-10-CM | POA: Diagnosis not present

## 2023-06-05 ENCOUNTER — Ambulatory Visit: Payer: PPO | Admitting: Physician Assistant

## 2023-06-06 ENCOUNTER — Inpatient Hospital Stay: Payer: PPO | Attending: Hematology and Oncology | Admitting: Hematology and Oncology

## 2023-06-06 ENCOUNTER — Ambulatory Visit: Payer: PPO | Admitting: Hematology and Oncology

## 2023-06-06 VITALS — BP 152/78 | HR 87 | Temp 97.5°F | Resp 18 | Ht 66.0 in | Wt 144.5 lb

## 2023-06-06 DIAGNOSIS — Z79899 Other long term (current) drug therapy: Secondary | ICD-10-CM | POA: Insufficient documentation

## 2023-06-06 DIAGNOSIS — C8510 Unspecified B-cell lymphoma, unspecified site: Secondary | ICD-10-CM | POA: Diagnosis not present

## 2023-06-06 DIAGNOSIS — C50412 Malignant neoplasm of upper-outer quadrant of left female breast: Secondary | ICD-10-CM | POA: Insufficient documentation

## 2023-06-06 DIAGNOSIS — Z170 Estrogen receptor positive status [ER+]: Secondary | ICD-10-CM | POA: Insufficient documentation

## 2023-06-06 DIAGNOSIS — Z853 Personal history of malignant neoplasm of breast: Secondary | ICD-10-CM | POA: Diagnosis not present

## 2023-06-06 DIAGNOSIS — Z780 Asymptomatic menopausal state: Secondary | ICD-10-CM

## 2023-06-06 DIAGNOSIS — Z79811 Long term (current) use of aromatase inhibitors: Secondary | ICD-10-CM | POA: Insufficient documentation

## 2023-06-06 MED ORDER — ANASTROZOLE 1 MG PO TABS: 1.0000 mg | ORAL_TABLET | Freq: Every day | ORAL | 3 refills | Status: DC

## 2023-06-06 NOTE — Progress Notes (Signed)
Patient Care Team: Hamrick, Durward Fortes, MD as PCP - General (Family Medicine) Lupita Leash, MD (Inactive) as Consulting Physician (Pulmonary Disease) Salvatore Marvel, MD as Consulting Physician (Orthopedic Surgery) Nita Sells, MD as Consulting Physician (Dermatology) Antony Contras, MD as Consulting Physician (Ophthalmology) Griselda Miner, MD as Consulting Physician (General Surgery) Serena Croissant, MD as Consulting Physician (Hematology and Oncology) Charolett Bumpers, MD as Consulting Physician (Gastroenterology) Axel Filler Larna Daughters, NP as Nurse Practitioner (Hematology and Oncology)  DIAGNOSIS:  Encounter Diagnoses  Name Primary?   Malignant neoplasm of upper-outer quadrant of left breast in female, estrogen receptor positive (HCC) Yes   Post-menopausal     SUMMARY OF ONCOLOGIC HISTORY: Oncology History  Cancer of right breast, stage 0  09/13/2009 Initial Biopsy   Right breast core needle biopsy: 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 Biopsy showed invasive and in situ ductal carcinoma, HER-2 equivocal by IHC (2+), negative by FISH (ratio 1.59), ER+ >95%, PR+ 85%, Ki67 10%.    Malignant neoplasm of left breast in female, estrogen receptor positive (HCC)  10/12/2020 Initial Diagnosis   Malignant neoplasm of left breast in female, estrogen receptor positive (HCC)   10/12/2020 Cancer Staging   Staging form: Breast, AJCC 8th Edition - Clinical stage from 10/12/2020: Stage IA (cT1b, cN0, cM0, G2, ER+, PR+, HER2-) - Signed by Serena Croissant, MD on 10/15/2020 Stage prefix: Initial diagnosis   11/05/2020 Surgery   Left lumpectomy: Grade 1 IDC, 1.1 cm,  intermediate grade DCIS, margins are negative, lymph node -0/1, ER greater than 95%, PR 85%, HER-2 negative, Ki-67 10%   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HCC)  11/11/2020 Initial Diagnosis   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HCC)   11/15/2020 - 03/03/2021 Chemotherapy   Patient is on Treatment Plan : IP NON-HODGKINS LYMPHOMA EPOCH q21d     11/19/2020 - 03/08/2021 Chemotherapy   Patient is on Treatment Plan : NON-HODGKINS LYMPHOMA Rituximab q21d       CHIEF COMPLIANT:   History of Present Illness   The patient, with a history of breast cancer and lymphoma, presents for a routine follow-up. She has been on anastrozole for almost two years following chemotherapy for her second breast cancer. She reports some hot flashes and joint stiffness, but it is unclear if these are side effects of the medication. Her most recent PET scan for lymphoma was in April and her mammograms were done in January, both of which were reportedly normal. The patient also takes calcium and vitamin D supplements.   She remains active, exercising for an hour daily on the elliptical.      ALLERGIES:  is allergic to ace inhibitors, advair diskus [fluticasone-salmeterol], neosporin [neomycin-polymyxin-gramicidin], olmesartan, and tape.  MEDICATIONS:  Current Outpatient Medications  Medication Sig Dispense Refill   atorvastatin (LIPITOR) 10 MG tablet Take 1 tablet (10 mg total) by mouth at bedtime. 90 tablet 0   B Complex Vitamins (B COMPLEX PO) Take 1 tablet by mouth daily with breakfast.     calcium carbonate (OS-CAL - DOSED IN MG OF ELEMENTAL CALCIUM) 1250 (500 Ca) MG tablet Take 1,250 mg by mouth daily.     ezetimibe (ZETIA) 10 MG tablet Take 1  tablet (10 mg total) by mouth at bedtime.     Multiple Vitamin (MULTIVITAMIN WITH MINERALS) TABS tablet Take 1 tablet by mouth daily.     Vitamin D, Ergocalciferol, (DRISDOL) 1.25 MG (50000 UNIT) CAPS capsule Take 1 capsule (50,000 Units total) by mouth every Friday.     anastrozole (ARIMIDEX) 1 MG  tablet Take 1 tablet (1 mg total) by mouth daily. 90 tablet 3   levothyroxine (SYNTHROID) 100 MCG tablet Take 1 tablet (100 mcg total) by mouth every other day. 10 tablet 0   levothyroxine (SYNTHROID) 88 MCG tablet Take 1 tablet (88 mcg total) by mouth every other day. 10 tablet 0   No current facility-administered medications for this visit.    PHYSICAL EXAMINATION: ECOG PERFORMANCE STATUS: 1 - Symptomatic but completely ambulatory  Vitals:   06/06/23 1201  BP: (!) 152/78  Pulse: 87  Resp: 18  Temp: (!) 97.5 F (36.4 C)  SpO2: 100%   Filed Weights   06/06/23 1201  Weight: 144 lb 8 oz (65.5 kg)     LABORATORY DATA:  I have reviewed the data as listed    Latest Ref Rng & Units 04/08/2021    3:14 PM 04/05/2021    2:33 PM 03/17/2021    9:20 AM  CMP  Glucose 70 - 99 mg/dL 65  89  83   BUN 8 - 23 mg/dL 13  15  10    Creatinine 0.44 - 1.00 mg/dL 4.09  8.11  9.14   Sodium 135 - 145 mmol/L 138  135  142   Potassium 3.5 - 5.1 mmol/L 3.5  4.1  3.2   Chloride 98 - 111 mmol/L 103  102  107   CO2 22 - 32 mmol/L 23  23  27    Calcium 8.9 - 10.3 mg/dL 8.4  8.5  8.0   Total Protein 6.5 - 8.1 g/dL 5.5  5.3  5.2   Total Bilirubin 0.3 - 1.2 mg/dL 0.9  0.7  0.4   Alkaline Phos 38 - 126 U/L 82  92  114   AST 15 - 41 U/L 19  21  22    ALT 0 - 44 U/L 17  14  24      Lab Results  Component Value Date   WBC 6.5 04/08/2021   HGB 11.5 (L) 04/08/2021   HCT 36.1 04/08/2021   MCV 93.8 04/08/2021   PLT 310 04/08/2021   NEUTROABS 4.1 04/08/2021    ASSESSMENT & PLAN:    Assessment and Plan    Breast Cancer On Anastrozole for almost 2 years with minimal side effects. Mammogram in January showed low density, which is favorable for detection. -Continue Anastrozole. -Refill Anastrozole for 1 year at [facility] in [place]. -Next mammogram scheduled for January.  Lymphoma PET scan in April was satisfactory. -No changes to current management.  Bone Health Patient is on Calcium and Vitamin D.  Expressed reluctance to take additional medication for bone health. -Schedule bone density scan in January with mammogram to assess bone health.  General Health Maintenance Patient is active, exercising 1 hour daily on the elliptical. -Continue current level of physical activity. -Follow-up in 1 year.          Orders Placed This Encounter  Procedures   DG Bone Density    Standing Status:   Future    Standing Expiration Date:   06/05/2024    Scheduling Instructions:     Please schedule it at same time as mammograms    Order  Specific Question:   Reason for Exam (SYMPTOM  OR DIAGNOSIS REQUIRED)    Answer:   Post menopausal    Order Specific Question:   Preferred imaging location?    Answer:   [facility]    Order Specific Question:   Release to patient    Answer:   Immediate   The patient has a good understanding of the overall plan. she agrees with it. she will call with any problems that may develop before the next visit here. Total time spent: 30 mins including face to face time and time spent for planning, charting and co-ordination of care   Tamsen Meek, MD 06/06/23

## 2023-07-18 DIAGNOSIS — R197 Diarrhea, unspecified: Secondary | ICD-10-CM | POA: Diagnosis not present

## 2023-07-18 DIAGNOSIS — Z853 Personal history of malignant neoplasm of breast: Secondary | ICD-10-CM | POA: Diagnosis not present

## 2023-07-18 DIAGNOSIS — Z08 Encounter for follow-up examination after completed treatment for malignant neoplasm: Secondary | ICD-10-CM | POA: Diagnosis not present

## 2023-07-18 DIAGNOSIS — C8332 Diffuse large B-cell lymphoma, intrathoracic lymph nodes: Secondary | ICD-10-CM | POA: Diagnosis not present

## 2023-07-18 DIAGNOSIS — C8333 Diffuse large B-cell lymphoma, intra-abdominal lymph nodes: Secondary | ICD-10-CM | POA: Diagnosis not present

## 2023-07-18 DIAGNOSIS — Z8572 Personal history of non-Hodgkin lymphomas: Secondary | ICD-10-CM | POA: Diagnosis not present

## 2023-07-30 DIAGNOSIS — R6889 Other general symptoms and signs: Secondary | ICD-10-CM | POA: Diagnosis not present

## 2023-07-30 DIAGNOSIS — J019 Acute sinusitis, unspecified: Secondary | ICD-10-CM | POA: Diagnosis not present

## 2023-08-16 ENCOUNTER — Other Ambulatory Visit: Payer: Self-pay | Admitting: Hematology and Oncology

## 2023-08-16 DIAGNOSIS — Z853 Personal history of malignant neoplasm of breast: Secondary | ICD-10-CM

## 2023-09-04 DIAGNOSIS — M545 Low back pain, unspecified: Secondary | ICD-10-CM | POA: Diagnosis not present

## 2023-10-01 ENCOUNTER — Ambulatory Visit
Admission: RE | Admit: 2023-10-01 | Discharge: 2023-10-01 | Disposition: A | Discharge status: Home / Self Care | Payer: PPO | Source: Ambulatory Visit | Attending: Hematology and Oncology | Admitting: Hematology and Oncology

## 2023-10-01 DIAGNOSIS — Z853 Personal history of malignant neoplasm of breast: Secondary | ICD-10-CM | POA: Diagnosis not present

## 2023-10-01 DIAGNOSIS — R92323 Mammographic fibroglandular density, bilateral breasts: Secondary | ICD-10-CM | POA: Diagnosis not present

## 2023-10-18 DIAGNOSIS — Z452 Encounter for adjustment and management of vascular access device: Secondary | ICD-10-CM | POA: Diagnosis not present

## 2023-10-26 ENCOUNTER — Other Ambulatory Visit (INDEPENDENT_AMBULATORY_CARE_PROVIDER_SITE_OTHER): Payer: Self-pay | Admitting: Oncology

## 2023-10-26 DIAGNOSIS — E559 Vitamin D deficiency, unspecified: Secondary | ICD-10-CM

## 2023-11-05 DIAGNOSIS — E559 Vitamin D deficiency, unspecified: Secondary | ICD-10-CM | POA: Diagnosis not present

## 2023-11-05 DIAGNOSIS — E039 Hypothyroidism, unspecified: Secondary | ICD-10-CM | POA: Diagnosis not present

## 2023-11-05 DIAGNOSIS — E7800 Pure hypercholesterolemia, unspecified: Secondary | ICD-10-CM | POA: Diagnosis not present

## 2023-11-05 DIAGNOSIS — Z79899 Other long term (current) drug therapy: Secondary | ICD-10-CM | POA: Diagnosis not present

## 2023-11-05 DIAGNOSIS — E538 Deficiency of other specified B group vitamins: Secondary | ICD-10-CM | POA: Diagnosis not present

## 2023-11-09 DIAGNOSIS — E559 Vitamin D deficiency, unspecified: Secondary | ICD-10-CM | POA: Diagnosis not present

## 2023-11-09 DIAGNOSIS — G620 Drug-induced polyneuropathy: Secondary | ICD-10-CM | POA: Diagnosis not present

## 2023-11-09 DIAGNOSIS — Z1331 Encounter for screening for depression: Secondary | ICD-10-CM | POA: Diagnosis not present

## 2023-11-09 DIAGNOSIS — Z9181 History of falling: Secondary | ICD-10-CM | POA: Diagnosis not present

## 2023-11-09 DIAGNOSIS — T451X5A Adverse effect of antineoplastic and immunosuppressive drugs, initial encounter: Secondary | ICD-10-CM | POA: Diagnosis not present

## 2023-11-09 DIAGNOSIS — C8510 Unspecified B-cell lymphoma, unspecified site: Secondary | ICD-10-CM | POA: Diagnosis not present

## 2023-11-09 DIAGNOSIS — E7800 Pure hypercholesterolemia, unspecified: Secondary | ICD-10-CM | POA: Diagnosis not present

## 2023-11-09 DIAGNOSIS — E538 Deficiency of other specified B group vitamins: Secondary | ICD-10-CM | POA: Diagnosis not present

## 2023-11-09 DIAGNOSIS — E039 Hypothyroidism, unspecified: Secondary | ICD-10-CM | POA: Diagnosis not present

## 2023-11-09 DIAGNOSIS — Z23 Encounter for immunization: Secondary | ICD-10-CM | POA: Diagnosis not present

## 2024-01-15 DIAGNOSIS — Z853 Personal history of malignant neoplasm of breast: Secondary | ICD-10-CM | POA: Diagnosis not present

## 2024-01-15 DIAGNOSIS — Z08 Encounter for follow-up examination after completed treatment for malignant neoplasm: Secondary | ICD-10-CM | POA: Diagnosis not present

## 2024-01-15 DIAGNOSIS — C8332 Diffuse large B-cell lymphoma, intrathoracic lymph nodes: Secondary | ICD-10-CM | POA: Diagnosis not present

## 2024-01-15 DIAGNOSIS — C8333 Diffuse large B-cell lymphoma, intra-abdominal lymph nodes: Secondary | ICD-10-CM | POA: Diagnosis not present

## 2024-01-15 DIAGNOSIS — R197 Diarrhea, unspecified: Secondary | ICD-10-CM | POA: Diagnosis not present

## 2024-01-15 DIAGNOSIS — Z8572 Personal history of non-Hodgkin lymphomas: Secondary | ICD-10-CM | POA: Diagnosis not present

## 2024-02-11 DIAGNOSIS — M898X1 Other specified disorders of bone, shoulder: Secondary | ICD-10-CM | POA: Diagnosis not present

## 2024-02-11 DIAGNOSIS — R233 Spontaneous ecchymoses: Secondary | ICD-10-CM | POA: Diagnosis not present

## 2024-04-10 ENCOUNTER — Other Ambulatory Visit: Payer: PPO

## 2024-04-11 DIAGNOSIS — Z452 Encounter for adjustment and management of vascular access device: Secondary | ICD-10-CM | POA: Diagnosis not present

## 2024-06-02 DIAGNOSIS — E039 Hypothyroidism, unspecified: Secondary | ICD-10-CM | POA: Diagnosis not present

## 2024-06-02 DIAGNOSIS — T451X5A Adverse effect of antineoplastic and immunosuppressive drugs, initial encounter: Secondary | ICD-10-CM | POA: Diagnosis not present

## 2024-06-02 DIAGNOSIS — I2510 Atherosclerotic heart disease of native coronary artery without angina pectoris: Secondary | ICD-10-CM | POA: Diagnosis not present

## 2024-06-02 DIAGNOSIS — E559 Vitamin D deficiency, unspecified: Secondary | ICD-10-CM | POA: Diagnosis not present

## 2024-06-02 DIAGNOSIS — E538 Deficiency of other specified B group vitamins: Secondary | ICD-10-CM | POA: Diagnosis not present

## 2024-06-02 DIAGNOSIS — G620 Drug-induced polyneuropathy: Secondary | ICD-10-CM | POA: Diagnosis not present

## 2024-06-02 DIAGNOSIS — E7800 Pure hypercholesterolemia, unspecified: Secondary | ICD-10-CM | POA: Diagnosis not present

## 2024-06-02 DIAGNOSIS — Z23 Encounter for immunization: Secondary | ICD-10-CM | POA: Diagnosis not present

## 2024-06-02 DIAGNOSIS — C50912 Malignant neoplasm of unspecified site of left female breast: Secondary | ICD-10-CM | POA: Diagnosis not present

## 2024-06-02 DIAGNOSIS — C8510 Unspecified B-cell lymphoma, unspecified site: Secondary | ICD-10-CM | POA: Diagnosis not present

## 2024-06-05 ENCOUNTER — Inpatient Hospital Stay: Payer: PPO | Admitting: Hematology and Oncology

## 2024-06-17 ENCOUNTER — Ambulatory Visit: Admitting: Hematology and Oncology

## 2024-07-08 ENCOUNTER — Other Ambulatory Visit: Payer: Self-pay | Admitting: Hematology and Oncology

## 2024-07-09 ENCOUNTER — Telehealth: Payer: Self-pay | Admitting: Hematology and Oncology

## 2024-07-09 NOTE — Telephone Encounter (Signed)
 Left vm for pt about scheduled appt date and time. Encouraged to call back if need to reschedule

## 2024-07-15 DIAGNOSIS — C8333 Diffuse large B-cell lymphoma, intra-abdominal lymph nodes: Secondary | ICD-10-CM | POA: Diagnosis not present

## 2024-08-19 ENCOUNTER — Inpatient Hospital Stay: Admitting: Hematology and Oncology

## 2024-08-19 ENCOUNTER — Other Ambulatory Visit: Payer: Self-pay | Admitting: Hematology and Oncology

## 2024-08-19 DIAGNOSIS — Z853 Personal history of malignant neoplasm of breast: Secondary | ICD-10-CM

## 2024-09-08 ENCOUNTER — Inpatient Hospital Stay: Admitting: Hematology and Oncology

## 2024-10-01 ENCOUNTER — Other Ambulatory Visit: Payer: Self-pay | Admitting: Hematology and Oncology

## 2024-10-01 ENCOUNTER — Ambulatory Visit
Admission: RE | Admit: 2024-10-01 | Discharge: 2024-10-01 | Disposition: A | Source: Ambulatory Visit | Attending: Hematology and Oncology

## 2024-10-01 DIAGNOSIS — Z853 Personal history of malignant neoplasm of breast: Secondary | ICD-10-CM

## 2024-10-06 ENCOUNTER — Inpatient Hospital Stay: Admitting: Hematology and Oncology

## 2024-11-04 ENCOUNTER — Inpatient Hospital Stay: Admitting: Hematology and Oncology
